# Patient Record
Sex: Female | Born: 1950
Health system: Southern US, Community
[De-identification: ages and names within clinical notes are randomized; demographics above are authoritative.]

## PROBLEM LIST (undated history)

## (undated) DIAGNOSIS — I5022 Chronic systolic (congestive) heart failure: Secondary | ICD-10-CM

## (undated) DIAGNOSIS — I255 Ischemic cardiomyopathy: Secondary | ICD-10-CM

## (undated) DIAGNOSIS — D649 Anemia, unspecified: Secondary | ICD-10-CM

## (undated) DIAGNOSIS — I10 Essential (primary) hypertension: Secondary | ICD-10-CM

## (undated) DIAGNOSIS — I480 Paroxysmal atrial fibrillation: Secondary | ICD-10-CM

## (undated) DIAGNOSIS — N186 End stage renal disease: Secondary | ICD-10-CM

## (undated) DIAGNOSIS — T8859XA Other complications of anesthesia, initial encounter: Secondary | ICD-10-CM

## (undated) DIAGNOSIS — E119 Type 2 diabetes mellitus without complications: Secondary | ICD-10-CM

## (undated) DIAGNOSIS — I2510 Atherosclerotic heart disease of native coronary artery without angina pectoris: Secondary | ICD-10-CM

## (undated) DIAGNOSIS — E785 Hyperlipidemia, unspecified: Secondary | ICD-10-CM

## (undated) DIAGNOSIS — T4145XA Adverse effect of unspecified anesthetic, initial encounter: Secondary | ICD-10-CM

## (undated) DIAGNOSIS — I219 Acute myocardial infarction, unspecified: Secondary | ICD-10-CM

## (undated) HISTORY — DX: Chronic systolic (congestive) heart failure: I50.22

## (undated) HISTORY — PX: CORONARY ANGIOPLASTY WITH STENT PLACEMENT: SHX49

## (undated) HISTORY — DX: End stage renal disease: N18.6

## (undated) HISTORY — DX: Paroxysmal atrial fibrillation: I48.0

## (undated) HISTORY — DX: Hyperlipidemia, unspecified: E78.5

## (undated) HISTORY — DX: Essential (primary) hypertension: I10

## (undated) HISTORY — DX: Ischemic cardiomyopathy: I25.5

## (undated) HISTORY — DX: Anemia, unspecified: D64.9

## (undated) HISTORY — PX: ABDOMINAL SURGERY: SHX537

---
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
## 2015-06-19 ENCOUNTER — Emergency Department: Payer: Medicaid Other

## 2015-06-19 ENCOUNTER — Inpatient Hospital Stay
Admission: EM | Admit: 2015-06-19 | Discharge: 2015-06-27 | DRG: 853 | Disposition: A | Discharge status: Home / Self Care | Payer: Medicaid Other | Attending: Internal Medicine | Admitting: Internal Medicine

## 2015-06-19 ENCOUNTER — Inpatient Hospital Stay (HOSPITAL_COMMUNITY)
Admit: 2015-06-19 | Discharge: 2015-06-19 | Disposition: A | Discharge status: Home / Self Care | Payer: Medicaid Other | Attending: Internal Medicine | Admitting: Internal Medicine

## 2015-06-19 ENCOUNTER — Encounter: Payer: Self-pay | Admitting: Emergency Medicine

## 2015-06-19 DIAGNOSIS — I4891 Unspecified atrial fibrillation: Secondary | ICD-10-CM | POA: Diagnosis present

## 2015-06-19 DIAGNOSIS — D638 Anemia in other chronic diseases classified elsewhere: Secondary | ICD-10-CM | POA: Diagnosis present

## 2015-06-19 DIAGNOSIS — I480 Paroxysmal atrial fibrillation: Secondary | ICD-10-CM | POA: Diagnosis present

## 2015-06-19 DIAGNOSIS — I252 Old myocardial infarction: Secondary | ICD-10-CM | POA: Diagnosis not present

## 2015-06-19 DIAGNOSIS — Z794 Long term (current) use of insulin: Secondary | ICD-10-CM | POA: Diagnosis not present

## 2015-06-19 DIAGNOSIS — I255 Ischemic cardiomyopathy: Secondary | ICD-10-CM | POA: Diagnosis present

## 2015-06-19 DIAGNOSIS — N183 Chronic kidney disease, stage 3 (moderate): Secondary | ICD-10-CM | POA: Diagnosis present

## 2015-06-19 DIAGNOSIS — Z79899 Other long term (current) drug therapy: Secondary | ICD-10-CM | POA: Diagnosis not present

## 2015-06-19 DIAGNOSIS — A419 Sepsis, unspecified organism: Principal | ICD-10-CM | POA: Diagnosis present

## 2015-06-19 DIAGNOSIS — I130 Hypertensive heart and chronic kidney disease with heart failure and stage 1 through stage 4 chronic kidney disease, or unspecified chronic kidney disease: Secondary | ICD-10-CM | POA: Diagnosis present

## 2015-06-19 DIAGNOSIS — I2510 Atherosclerotic heart disease of native coronary artery without angina pectoris: Secondary | ICD-10-CM | POA: Diagnosis present

## 2015-06-19 DIAGNOSIS — Z6827 Body mass index (BMI) 27.0-27.9, adult: Secondary | ICD-10-CM

## 2015-06-19 DIAGNOSIS — J189 Pneumonia, unspecified organism: Secondary | ICD-10-CM

## 2015-06-19 DIAGNOSIS — I214 Non-ST elevation (NSTEMI) myocardial infarction: Secondary | ICD-10-CM

## 2015-06-19 DIAGNOSIS — J90 Pleural effusion, not elsewhere classified: Secondary | ICD-10-CM | POA: Diagnosis not present

## 2015-06-19 DIAGNOSIS — Z7982 Long term (current) use of aspirin: Secondary | ICD-10-CM | POA: Diagnosis not present

## 2015-06-19 DIAGNOSIS — I5023 Acute on chronic systolic (congestive) heart failure: Secondary | ICD-10-CM | POA: Diagnosis present

## 2015-06-19 DIAGNOSIS — E1122 Type 2 diabetes mellitus with diabetic chronic kidney disease: Secondary | ICD-10-CM | POA: Diagnosis present

## 2015-06-19 DIAGNOSIS — N170 Acute kidney failure with tubular necrosis: Secondary | ICD-10-CM | POA: Diagnosis not present

## 2015-06-19 DIAGNOSIS — I420 Dilated cardiomyopathy: Secondary | ICD-10-CM | POA: Clinically undetermined

## 2015-06-19 HISTORY — DX: Atherosclerotic heart disease of native coronary artery without angina pectoris: I25.10

## 2015-06-19 HISTORY — DX: Type 2 diabetes mellitus without complications: E11.9

## 2015-06-19 LAB — PROTIME-INR
INR: 1
Prothrombin Time: 13.4 seconds (ref 11.4–15.0)

## 2015-06-19 LAB — COMPREHENSIVE METABOLIC PANEL
ALBUMIN: 3.1 g/dL — AB (ref 3.5–5.0)
ALT: 60 U/L — ABNORMAL HIGH (ref 14–54)
AST: 53 U/L — AB (ref 15–41)
Alkaline Phosphatase: 309 U/L — ABNORMAL HIGH (ref 38–126)
Anion gap: 10 (ref 5–15)
BILIRUBIN TOTAL: 0.4 mg/dL (ref 0.3–1.2)
BUN: 46 mg/dL — AB (ref 6–20)
CHLORIDE: 103 mmol/L (ref 101–111)
CO2: 21 mmol/L — AB (ref 22–32)
Calcium: 8.5 mg/dL — ABNORMAL LOW (ref 8.9–10.3)
Creatinine, Ser: 1.73 mg/dL — ABNORMAL HIGH (ref 0.44–1.00)
GFR calc Af Amer: 35 mL/min — ABNORMAL LOW (ref >60)
GFR calc non Af Amer: 30 mL/min — ABNORMAL LOW (ref >60)
GLUCOSE: 105 mg/dL — AB (ref 65–99)
POTASSIUM: 3.8 mmol/L (ref 3.5–5.1)
SODIUM: 134 mmol/L — AB (ref 135–145)
TOTAL PROTEIN: 6.5 g/dL (ref 6.5–8.1)

## 2015-06-19 LAB — GLUCOSE, CAPILLARY
GLUCOSE-CAPILLARY: 71 mg/dL (ref 65–99)
GLUCOSE-CAPILLARY: 160 mg/dL — AB (ref 65–99)
Glucose-Capillary: 145 mg/dL — ABNORMAL HIGH (ref 65–99)
Glucose-Capillary: 175 mg/dL — ABNORMAL HIGH (ref 65–99)

## 2015-06-19 LAB — TSH: TSH: 8.859 u[IU]/mL — ABNORMAL HIGH (ref 0.350–4.500)

## 2015-06-19 LAB — TROPONIN I
Troponin I: 1.42 ng/mL — ABNORMAL HIGH (ref <0.031)
Troponin I: 2.29 ng/mL — ABNORMAL HIGH (ref <0.031)
Troponin I: 2.94 ng/mL — ABNORMAL HIGH (ref <0.031)

## 2015-06-19 LAB — T4, FREE: FREE T4: 1.4 ng/dL — AB (ref 0.61–1.12)

## 2015-06-19 LAB — LACTIC ACID, PLASMA: LACTIC ACID, VENOUS: 0.9 mmol/L (ref 0.5–2.0)

## 2015-06-19 LAB — APTT: aPTT: 28 seconds (ref 24–36)

## 2015-06-19 LAB — HEPARIN LEVEL (UNFRACTIONATED): HEPARIN UNFRACTIONATED: 0.37 [IU]/mL (ref 0.30–0.70)

## 2015-06-19 LAB — BRAIN NATRIURETIC PEPTIDE: B NATRIURETIC PEPTIDE 5: 1782 pg/mL — AB (ref 0.0–100.0)

## 2015-06-19 MED ORDER — ALBUTEROL SULFATE (2.5 MG/3ML) 0.083% IN NEBU: 2.5000 mg | INHALATION_SOLUTION | RESPIRATORY_TRACT | Status: DC | PRN

## 2015-06-19 MED ORDER — ONDANSETRON HCL 4 MG PO TABS: 4.0000 mg | ORAL_TABLET | Freq: Four times a day (QID) | ORAL | Status: DC | PRN

## 2015-06-19 MED ORDER — ACETAMINOPHEN 650 MG RE SUPP: 650.0000 mg | Freq: Four times a day (QID) | RECTAL | Status: DC | PRN

## 2015-06-19 MED ORDER — FLEET ENEMA 7-19 GM/118ML RE ENEM: 1.0000 | ENEMA | Freq: Once | RECTAL | Status: DC | PRN

## 2015-06-19 MED ORDER — METOPROLOL TARTRATE 1 MG/ML IV SOLN
5.0000 mg | INTRAVENOUS | Status: AC
  Filled 2015-06-19: qty 5

## 2015-06-19 MED ORDER — HEPARIN (PORCINE) IN NACL 100-0.45 UNIT/ML-% IJ SOLN
900.0000 [IU]/h | INTRAMUSCULAR | Status: DC
  Administered 2015-06-19 – 2015-06-20 (×3): 900 [IU]/h via INTRAVENOUS
  Filled 2015-06-19 (×3): qty 250

## 2015-06-19 MED ORDER — OXYCODONE HCL 5 MG PO TABS: 5.0000 mg | ORAL_TABLET | ORAL | Status: DC | PRN

## 2015-06-19 MED ORDER — POLYETHYLENE GLYCOL 3350 17 G PO PACK: 17.0000 g | PACK | Freq: Every day | ORAL | Status: DC | PRN

## 2015-06-19 MED ORDER — DILTIAZEM HCL 30 MG PO TABS
30.0000 mg | ORAL_TABLET | Freq: Once | ORAL | Status: AC
  Administered 2015-06-19: 30 mg via ORAL
  Filled 2015-06-19: qty 1

## 2015-06-19 MED ORDER — LEVOFLOXACIN IN D5W 750 MG/150ML IV SOLN
750.0000 mg | INTRAVENOUS | Status: DC
  Administered 2015-06-21: 750 mg via INTRAVENOUS
  Filled 2015-06-19: qty 150

## 2015-06-19 MED ORDER — INSULIN GLARGINE 100 UNIT/ML ~~LOC~~ SOLN
20.0000 [IU] | Freq: Every day | SUBCUTANEOUS | Status: DC
  Administered 2015-06-19 – 2015-06-26 (×7): 20 [IU] via SUBCUTANEOUS
  Filled 2015-06-19 (×13): qty 0.2

## 2015-06-19 MED ORDER — SODIUM CHLORIDE 0.9 % IJ SOLN: 3.0000 mL | Freq: Two times a day (BID) | INTRAMUSCULAR | Status: DC

## 2015-06-19 MED ORDER — DILTIAZEM HCL 100 MG IV SOLR
5.0000 mg/h | Freq: Once | INTRAVENOUS | Status: DC
  Filled 2015-06-19: qty 100

## 2015-06-19 MED ORDER — SODIUM CHLORIDE 0.9 % IJ SOLN
3.0000 mL | Freq: Two times a day (BID) | INTRAMUSCULAR | Status: DC
  Administered 2015-06-20 – 2015-06-26 (×13): 3 mL via INTRAVENOUS

## 2015-06-19 MED ORDER — SODIUM CHLORIDE 0.9 % IJ SOLN: 3.0000 mL | INTRAMUSCULAR | Status: DC | PRN

## 2015-06-19 MED ORDER — FUROSEMIDE 10 MG/ML IJ SOLN
20.0000 mg | Freq: Two times a day (BID) | INTRAMUSCULAR | Status: DC
  Administered 2015-06-19: 20 mg via INTRAVENOUS
  Filled 2015-06-19: qty 2

## 2015-06-19 MED ORDER — SODIUM CHLORIDE 0.9 % IV SOLN
250.0000 mL | INTRAVENOUS | Status: DC | PRN
  Administered 2015-06-22: 250 mL via INTRAVENOUS

## 2015-06-19 MED ORDER — ONDANSETRON HCL 4 MG/2ML IJ SOLN
4.0000 mg | Freq: Four times a day (QID) | INTRAMUSCULAR | Status: DC | PRN
  Administered 2015-06-20: 4 mg via INTRAVENOUS
  Filled 2015-06-19: qty 2

## 2015-06-19 MED ORDER — ASPIRIN 81 MG PO CHEW
324.0000 mg | CHEWABLE_TABLET | Freq: Once | ORAL | Status: AC
  Administered 2015-06-19: 324 mg via ORAL
  Filled 2015-06-19: qty 4

## 2015-06-19 MED ORDER — LEVOFLOXACIN IN D5W 750 MG/150ML IV SOLN
750.0000 mg | Freq: Once | INTRAVENOUS | Status: AC
  Administered 2015-06-19: 750 mg via INTRAVENOUS
  Filled 2015-06-19: qty 150

## 2015-06-19 MED ORDER — METOPROLOL TARTRATE 25 MG PO TABS
25.0000 mg | ORAL_TABLET | Freq: Two times a day (BID) | ORAL | Status: DC
  Administered 2015-06-19 – 2015-06-22 (×7): 25 mg via ORAL
  Filled 2015-06-19 (×7): qty 1

## 2015-06-19 MED ORDER — DILTIAZEM HCL 25 MG/5ML IV SOLN
10.0000 mg | Freq: Once | INTRAVENOUS | Status: AC
  Administered 2015-06-19: 10 mg via INTRAVENOUS
  Filled 2015-06-19: qty 5

## 2015-06-19 MED ORDER — INSULIN ASPART 100 UNIT/ML ~~LOC~~ SOLN
0.0000 [IU] | Freq: Every day | SUBCUTANEOUS | Status: DC
  Administered 2015-06-21 – 2015-06-23 (×3): 2 [IU] via SUBCUTANEOUS
  Administered 2015-06-24: 100 [IU] via SUBCUTANEOUS
  Administered 2015-06-25: 2 [IU] via SUBCUTANEOUS
  Administered 2015-06-26: 3 [IU] via SUBCUTANEOUS
  Filled 2015-06-19: qty 2
  Filled 2015-06-19: qty 3
  Filled 2015-06-19 (×4): qty 2

## 2015-06-19 MED ORDER — HEPARIN SODIUM (PORCINE) 5000 UNIT/ML IJ SOLN
60.0000 [IU]/kg | Freq: Once | INTRAMUSCULAR | Status: AC
  Administered 2015-06-19: 3650 [IU] via INTRAVENOUS
  Filled 2015-06-19: qty 1

## 2015-06-19 MED ORDER — ATORVASTATIN CALCIUM 20 MG PO TABS
40.0000 mg | ORAL_TABLET | Freq: Every day | ORAL | Status: DC
  Administered 2015-06-19 – 2015-06-26 (×7): 40 mg via ORAL
  Filled 2015-06-19 (×7): qty 2

## 2015-06-19 MED ORDER — ACETAMINOPHEN 325 MG PO TABS: 650.0000 mg | ORAL_TABLET | Freq: Four times a day (QID) | ORAL | Status: DC | PRN

## 2015-06-19 MED ORDER — BISACODYL 5 MG PO TBEC: 5.0000 mg | DELAYED_RELEASE_TABLET | Freq: Every day | ORAL | Status: DC | PRN

## 2015-06-19 MED ORDER — MORPHINE SULFATE (PF) 2 MG/ML IV SOLN: 2.0000 mg | INTRAVENOUS | Status: DC | PRN

## 2015-06-19 MED ORDER — ASPIRIN EC 81 MG PO TBEC
81.0000 mg | DELAYED_RELEASE_TABLET | Freq: Every day | ORAL | Status: DC
  Administered 2015-06-20 – 2015-06-27 (×7): 81 mg via ORAL
  Filled 2015-06-19 (×7): qty 1

## 2015-06-19 MED ORDER — HEPARIN (PORCINE) IN NACL 100-0.45 UNIT/ML-% IJ SOLN
750.0000 [IU]/h | Freq: Once | INTRAMUSCULAR | Status: DC
  Filled 2015-06-19: qty 250

## 2015-06-19 MED ORDER — DOCUSATE SODIUM 100 MG PO CAPS
100.0000 mg | ORAL_CAPSULE | Freq: Two times a day (BID) | ORAL | Status: DC
  Administered 2015-06-19 – 2015-06-26 (×15): 100 mg via ORAL
  Filled 2015-06-19 (×17): qty 1

## 2015-06-19 MED ORDER — INSULIN ASPART 100 UNIT/ML ~~LOC~~ SOLN
0.0000 [IU] | Freq: Three times a day (TID) | SUBCUTANEOUS | Status: DC
  Administered 2015-06-19: 2 [IU] via SUBCUTANEOUS
  Administered 2015-06-20: 1 [IU] via SUBCUTANEOUS
  Administered 2015-06-20: 2 [IU] via SUBCUTANEOUS
  Administered 2015-06-20: 1 [IU] via SUBCUTANEOUS
  Administered 2015-06-21: 3 [IU] via SUBCUTANEOUS
  Administered 2015-06-21: 2 [IU] via SUBCUTANEOUS
  Administered 2015-06-21: 3 [IU] via SUBCUTANEOUS
  Administered 2015-06-22: 2 [IU] via SUBCUTANEOUS
  Administered 2015-06-23: 1 [IU] via SUBCUTANEOUS
  Administered 2015-06-23: 3 [IU] via SUBCUTANEOUS
  Administered 2015-06-24: 1 [IU] via SUBCUTANEOUS
  Administered 2015-06-24 – 2015-06-25 (×4): 2 [IU] via SUBCUTANEOUS
  Administered 2015-06-26: 1 [IU] via SUBCUTANEOUS
  Administered 2015-06-27 (×2): 2 [IU] via SUBCUTANEOUS
  Filled 2015-06-19 (×2): qty 2
  Filled 2015-06-19: qty 1
  Filled 2015-06-19: qty 3
  Filled 2015-06-19: qty 2
  Filled 2015-06-19: qty 1
  Filled 2015-06-19: qty 2
  Filled 2015-06-19: qty 3
  Filled 2015-06-19 (×2): qty 1
  Filled 2015-06-19: qty 3
  Filled 2015-06-19 (×6): qty 2
  Filled 2015-06-19: qty 1

## 2015-06-19 NOTE — Consult Note (Signed)
ANTICOAGULATION CONSULT NOTE - Initial Consult  Pharmacy Consult for heparin Indication: atrial fibrillation  No Known Allergies  Patient Measurements: Height: 5\' 4"  (162.6 cm) Weight: 135 lb (61.236 kg) IBW/kg (Calculated) : 54.7 Heparin Dosing Weight: 61.2kg  Vital Signs: Temp: 98.1 F (36.7 C) (01/09 0901) Temp Source: Oral (01/09 0901) BP: 122/75 mmHg (01/09 0947) Pulse Rate: 148 (01/09 0901)  Labs:  Recent Labs  06/19/15 0923  APTT 28  TROPONINI 1.42*    CrCl cannot be calculated (Patient has no serum creatinine result on file.).   Medical History: Past Medical History  Diagnosis Date  . MI (myocardial infarction) ([place])   . Diabetes mellitus without complication ([facility])     Medications:  Scheduled:  . aspirin  324 mg Oral Once  . heparin  60 Units/kg Intravenous Once   Infusions:  . diltiazem (CARDIZEM) infusion    . heparin    . levofloxacin (LEVAQUIN) IV      Assessment: Theresa Rogers is a 65yo female with atrial fibrillation. Pharmacy consulted to dose heparin in this patient.  Goal of Therapy:  Heparin level 0.3-0.7 units/ml Monitor platelets by anticoagulation protocol: Yes   Plan:  Give 3650 units bolus x 1 Start heparin infusion at 900 units/hr Check anti-Xa level in 6 hours and daily while on heparin Continue to monitor H&H and platelets  Roe Coombs, PharmD Pharmacy Resident 06/19/2015

## 2015-06-19 NOTE — ED Provider Notes (Signed)
[facility] Emergency Department Provider Note     Time seen: ----------------------------------------- 8:59 AM on 06/19/2015 -----------------------------------------    I have reviewed the triage vital signs and the nursing notes.   HISTORY  Chief Complaint Shortness of Breath    HPI Theresa Rogers is a 65 y.o. female who presents ER with cold symptoms since New Year's Day and increased shortness of breath. Patient with labored breathing on arrival with difficulty speaking in full sentences. She was also noted to be tachycardic on arrival. She has not reported a history of arrhythmia in the past, denies fevers or chills but has had significant cough. Denies nausea vomiting or diarrhea   No past medical history on file.  There are no active problems to display for this patient.   No past surgical history on file.  Allergies Review of patient's allergies indicates not on file.  Social History Social History  Substance Use Topics  . Smoking status: Not on file  . Smokeless tobacco: Not on file  . Alcohol Use: Not on file    Review of Systems Constitutional: Negative for fever. Eyes: Negative for visual changes. ENT: Negative for sore throat. Cardiovascular: Negative for chest pain. Respiratory: Positive shortness of breath, cough Gastrointestinal: Negative for abdominal pain, vomiting and diarrhea. Genitourinary: Negative for dysuria. Musculoskeletal: Negative for back pain. Positive for leg swelling Skin: Negative for rash. Neurological: Negative for headaches, focal weakness or numbness.  10-point ROS otherwise negative.  ____________________________________________   PHYSICAL EXAM:  VITAL SIGNS: ED Triage Vitals  Enc Vitals Group     BP --      Pulse --      Resp --      Temp --      Temp src --      SpO2 --      Weight --      Height --      Head Cir --      Peak Flow --      Pain Score --      Pain Loc --    Pain Edu? --      Excl. in [place]? --     Constitutional: Alert and oriented. Mild distress Eyes: Conjunctivae are normal. PERRL. Normal extraocular movements. ENT   Head: Normocephalic and atraumatic.   Nose: No congestion/rhinnorhea.   Mouth/Throat: Mucous membranes are moist.   Neck: No stridor. Cardiovascular: Rapid rate, irregular rhythm. Normal and symmetric distal pulses are present in all extremities. No murmurs, rubs, or gallops. Respiratory: Normal respiratory effort without tachypnea nor retractions. Mild rales in the bases. Gastrointestinal: Soft and nontender. No distention. No abdominal bruits.  Musculoskeletal: Nontender with normal range of motion in all extremities. Mild edema around the ankles Neurologic:  Normal speech and language. No gross focal neurologic deficits are appreciated. Speech is normal. No gait instability. Skin:  Skin is warm, dry and intact. No rash noted. Psychiatric: Mood and affect are normal. Speech and behavior are normal. Patient exhibits appropriate insight and judgment. ____________________________________________  EKG: Interpreted by me. Atrial fibrillation with a rapid ventricular response, rate is 146 bpm, normal QRS width, long QT interval.  ____________________________________________  ED COURSE:  Pertinent labs & imaging results that were available during my care of the patient were reviewed by me and considered in my medical decision making (see chart for details). Patient with new onset A. fib, she'll be given IV and oral Cardizem for rate control. Blood pressure is normal this time. We'll check basic  cardiac labs and chest x-ray. ____________________________________________    LABS (pertinent positives/negatives)  Labs Reviewed  BRAIN NATRIURETIC PEPTIDE - Abnormal; Notable for the following:    B Natriuretic Peptide 1782.0 (*)    All other components within normal limits  TROPONIN I - Abnormal; Notable for the  following:    Troponin I 1.42 (*)    All other components within normal limits  COMPREHENSIVE METABOLIC PANEL - Abnormal; Notable for the following:    Sodium 134 (*)    CO2 21 (*)    Glucose, Bld 105 (*)    BUN 46 (*)    Creatinine, Ser 1.73 (*)    Calcium 8.5 (*)    Albumin 3.1 (*)    AST 53 (*)    ALT 60 (*)    Alkaline Phosphatase 309 (*)    GFR calc non Af Amer 30 (*)    GFR calc Af Amer 35 (*)    All other components within normal limits  TSH - Abnormal; Notable for the following:    TSH 8.859 (*)    All other components within normal limits  APTT  CBC WITH DIFFERENTIAL/PLATELET  PROTIME-INR  T4, FREE    RADIOLOGY Images were viewed by me  Chest x-ray Reveals right upper lobe pneumonia, Basilar atelectasis  CRITICAL CARE Performed by: Earleen Newport   Total critical care time: 30 minutes  Critical care time was exclusive of separately billable procedures and treating other patients.  Critical care was necessary to treat or prevent imminent or life-threatening deterioration.  Critical care was time spent personally by me on the following activities: development of treatment plan with patient and/or surrogate as well as nursing, discussions with consultants, evaluation of patient's response to treatment, examination of patient, obtaining history from patient or surrogate, ordering and performing treatments and interventions, ordering and review of laboratory studies, ordering and review of radiographic studies, pulse oximetry and re-evaluation of patient's condition.  ____________________________________________  FINAL ASSESSMENT AND PLAN  Dyspnea, rapid atrial fibrillation, pneumonia, elevated troponin  Plan: Patient with labs and imaging as dictated above. Patient with new onset atrial fibrillation with a rapid ventricular response, elevated troponin likely demand related. She was placed on a Cardizem and heparin drip as well as given oral aspirin. Vital  signs are stable at this time.   Earleen Newport, MD   Earleen Newport, MD 06/19/15 1057

## 2015-06-19 NOTE — Progress Notes (Signed)
Patient presently resting in bed at this time, denies pain, VSS NSR on the monitor,denies any chest pain or discomfort, heparin drip infusing at 9 ML per hr n active bleeding noted

## 2015-06-19 NOTE — Progress Notes (Signed)
*  PRELIMINARY RESULTS* Echocardiogram 2D Echocardiogram has been performed.  Theresa Rogers 06/19/2015, 5:04 PM

## 2015-06-19 NOTE — Consult Note (Signed)
Cardiology Consultation Note  Patient ID: Gurneet Prak, MRN: [id number], DOB/AGE: Sep 22, 1950 65 y.o. Admit date: 06/19/2015   Date of Consult: 06/19/2015 Primary Physician: Pcp Not In System Primary Cardiologist: New to [facility]  Chief Complaint: SOB Reason for Consult: Afib with RVR and NSTEMI  HPI: 65 y.o. female with h/o CAD with history of MI/syncope s/p remote PCI in 2009 in [place], and DM2 diagnosed in her 26's who presented to [facility] with increased SOB and was found to have RUL PNA, be in new onset Afib with RVR with heart rates in the 140's, and have a troponin of 1.42.   She was previously followed by her cardiologist in [place] starting back in 2009 after a possible MI/syncopal event at work. She denies any CPR/defibrillation at that time. She was taken to the cath lab in 2009 and underwent PCI to a single coronary artery. She subsequently underwent exercise stress testing yearly for the first 5 years after her above event with self reported normal results. She has not had a cardiac cath since first original one in 2009. She moved down to [place] from [place] to be closer to her grandchildren.   She has been in her usual state of health lately, just under increased amounts of stress with the above move and going through a divorce. Over the past week she has been experiencing increased SOB and cough that has been productive of green sputum. Symptoms were similar to when she had PNA in her 40's. She has not had any chest pain, tachy-palpitations, nausea, vomiting, presyncope, or syncope. Her appetite has been normal. She denies any orthopnea, PND, LEE, or early satiety.   Upon the patient's arrival to [facility] they were found to have a troponin of 1.42, BNP of 1782, SCr 1.73, BUN 46, CBC pending, lactic acid 0.9. ECG showed Afib, 146 bpm, right axis deviation, prolonged QT, inferior Q waves, inferolateral TWI, CXR showed RUL PNA and small pleural effusions. She was started on a Cardizem gtt and converted to  sinus rhythm around 11:26 AM and has remained in sinus with a heart rate in the 70's. She was placed on Lopressor. She was also placed on a heparin gtt. She was started on Levaquin for her PNA. Echo is pending. While she was in Afib with RVR she did not feel in tachy-palpitations or any increased dyspnea.    Past Medical History  Diagnosis Date  . CAD (coronary artery disease)     a. s/p MI/syncope in 2009 s/p remote stenting  . Diabetes mellitus without complication ([place])       Most Recent Cardiac Studies: None   Surgical History:  Past Surgical History  Procedure Laterality Date  . Coronary angioplasty with stent placement       Home Meds: Prior to Admission medications   Medication Sig Start Date End Date Taking? Authorizing Provider  insulin glargine (LANTUS) 100 UNIT/ML injection Inject 20 Units into the skin at bedtime.   Yes Historical Provider, MD  insulin lispro (HUMALOG) 100 UNIT/ML injection Inject 8-12 Units into the skin 3 (three) times daily before meals.   Yes Historical Provider, MD    Inpatient Medications:  . aspirin EC  81 mg Oral Daily  . atorvastatin  40 mg Oral q1800  . docusate sodium  100 mg Oral BID  . insulin aspart  0-5 Units Subcutaneous QHS  . insulin aspart  0-9 Units Subcutaneous TID WC  . insulin glargine  20 Units Subcutaneous QHS  . [START ON 06/21/2015]  levofloxacin (LEVAQUIN) IV  750 mg Intravenous Q48H  . metoprolol  5 mg Intravenous STAT  . metoprolol tartrate  25 mg Oral BID  . sodium chloride  3 mL Intravenous Q12H  . sodium chloride  3 mL Intravenous Q12H   . heparin 900 Units/hr (06/19/15 1145)    Allergies: No Known Allergies  Social History   Social History  . Marital Status: Divorced    Spouse Name: N/A  . Number of Children: N/A  . Years of Education: N/A   Occupational History  . Not on file.   Social History Main Topics  . Smoking status: Never Smoker   . Smokeless tobacco: Not on file  . Alcohol Use: No  . Drug  Use: No  . Sexual Activity: Not on file   Other Topics Concern  . Not on file   Social History Narrative  . No narrative on file     Family History  Problem Relation Age of Onset  . CAD Mother   . Stroke Father   . Diabetes Brother   . Kidney failure Brother      Review of Systems: Review of Systems  Constitutional: Positive for chills and malaise/fatigue. Negative for fever, weight loss and diaphoresis.  HENT: Positive for congestion. Negative for sore throat.   Eyes: Negative for discharge and redness.  Respiratory: Positive for cough, shortness of breath and wheezing. Negative for hemoptysis.   Cardiovascular: Negative for chest pain, palpitations, orthopnea, claudication, leg swelling and PND.  Gastrointestinal: Negative for heartburn, nausea, vomiting, abdominal pain, blood in stool and melena.  Genitourinary: Negative for hematuria.  Musculoskeletal: Negative for myalgias and falls.  Skin: Negative for rash.  Neurological: Positive for weakness and headaches. Negative for dizziness, tingling, tremors, sensory change, speech change, focal weakness and loss of consciousness.  Endo/Heme/Allergies: Does not bruise/bleed easily.  Psychiatric/Behavioral: Negative for substance abuse. The patient is not nervous/anxious.   All other systems reviewed and are negative.   Labs:  Recent Labs  06/19/15 0923  TROPONINI 1.42*   No results found for: WBC, HGB, HCT, MCV, PLT   Recent Labs Lab 06/19/15 0923  NA 134*  K 3.8  CL 103  CO2 21*  BUN 46*  CREATININE 1.73*  CALCIUM 8.5*  PROT 6.5  BILITOT 0.4  ALKPHOS 309*  ALT 60*  AST 53*  GLUCOSE 105*   No results found for: CHOL, HDL, LDLCALC, TRIG No results found for: DDIMER  Radiology/Studies:  Dg Chest Port 1 View  06/19/2015  CLINICAL DATA:  Cold, cough and shortness of Breath for 9 days. EXAM: PORTABLE CHEST 1 VIEW COMPARISON:  None. FINDINGS: The heart is mildly enlarged. The mediastinal and hilar contours are  grossly normal. Low lung volumes with vascular crowding and bibasilar atelectasis. Probable small bilateral pleural effusions. Right upper lobe infiltrate is noted. The bony thorax is intact. IMPRESSION: Right upper lobe pneumonia. Low lung volumes with vascular crowding, bibasilar atelectasis and small pleural effusions. Electronically Signed   By: Marijo Sanes M.D.   On: 06/19/2015 09:26    EKG: Afib, 146 bpm, right axis deviation, prolonged QT, inferior Q waves, inferolateral TWI  Weights: Filed Weights   06/19/15 0901  Weight: 135 lb (61.236 kg)     Physical Exam: Blood pressure 134/67, pulse 79, temperature 97.9 F (36.6 C), temperature source Oral, resp. rate 18, height 5\' 4"  (1.626 m), weight 135 lb (61.236 kg), SpO2 93 %. Body mass index is 23.16 kg/(m^2). General: Well developed, well nourished, in  no acute distress. Head: Normocephalic, atraumatic, sclera non-icteric, no xanthomas, nares are without discharge.  Neck: Negative for carotid bruits. JVD not elevated. Lungs: Right upper lobe rhonchi. Breathing is unlabored. Heart: RRR with S1 S2. No murmurs, rubs, or gallops appreciated. Abdomen: Soft, non-tender, non-distended with normoactive bowel sounds. No hepatomegaly. No rebound/guarding. No obvious abdominal masses. Msk:  Strength and tone appear normal for age. Extremities: No clubbing or cyanosis. No edema.  Distal pedal pulses are 2+ and equal bilaterally. Neuro: Alert and oriented X 3. No facial asymmetry. No focal deficit. Moves all extremities spontaneously. Psych:  Responds to questions appropriately with a normal affect.    Assessment and Plan:   1. New onset Afib with RVR: -Currently in sinus rhythm with heart rates in the 70's -Likely in the setting of her RUL PNA -It is unclear how long she was in Afib as she presented in Afib and was asymptomatic from a tachy-palpitation and increased dyspnea standpoint (outside of her PNA) -Continue Lopressor 25 mg bid for  rate control -On heparin gtt for anticoagulation at this time. Continue for now pending her ischemic work up below, though she will need long term full dose anticoagulation given her CHADS2VASc of at least 3 (DM, vascular disease, sex category). Echo is pending and likely to increase this to 4 -Once she has completed her below ischemic work up could use either Xarelto 15 mg q dinner (CrCl 32.18 mL/min) or Eliquis 5 mg bid (as SCr 1.73 only satisfies one of the 3 requirements to decrease to 2.5 mg bid)  2. Elevated troponin: -Her initial value of 1.42 is generally more elevated than would be assumed for demand ischemia and lends [place] to believe there is underlying coronary disease leading to this elevation in the setting of her RUL PNA and Afib with RVR -2nd troponin is pending at this time to trend -Continue heparin gtt at this time -Echo is pending to evaluate LV function, wall motion, and right-sided pressure -In the setting of the above elevated troponin she would need further ischemic work up. Will discuss with MD cath schedule -Risks and benefits of cardiac catheterization have been discussed with the patient including risks of bleeding, bruising, infection, kidney damage, stroke, heart attack, and death. The patient understands these risks and is willing to proceed with the procedure. All questions have been answered and concerns listened to  3. Sepsis in the setting of RUL PNA: -Patient meets at least 2/4 SIRS criteria (CBC not resulted), with source of infection known -Levaquin per IM -Consider changing albuterol to Xopenex in the setting of the above Afib with RVR -May need steroids if indicated   4. Diabetes: -SSI per IM   Signed, Christell Faith, PA-C Pager: [phone] 06/19/2015, 4:30 PM

## 2015-06-19 NOTE — Progress Notes (Signed)
Skin assessment completed withness by [employer] RN

## 2015-06-19 NOTE — ED Notes (Signed)
Pt eating [place] sandwich meal

## 2015-06-19 NOTE — H&P (Signed)
Theresa Rogers at [place] NAME: Theresa Rogers    MR#:  [id number]  DATE OF BIRTH:  April 17, 1951  DATE OF ADMISSION:  06/19/2015  PRIMARY CARE PHYSICIAN: Pcp Not In System   REQUESTING/REFERRING PHYSICIAN: Dr. Jimmye Norman  CHIEF COMPLAINT:   Chief Complaint  Patient presents with  . Shortness of Breath    HISTORY OF PRESENT ILLNESS:  [name]  is a 65 y.o. female with a known history of CAD, IDDM, HTN here with SOB, fatigue. Tachycardic with Afib on arrival into 140s. Given cardizem bolus IV and PO 30mg . Now 85-125 between Afib and NSR. Symptoms started out as cold and progressively worsened over 7 days. Has orthopnea and edema.  PCI 8 yrs back - 1 stent. Last stress test 5 years back. Moved from [place] 3 months back.  PAST MEDICAL HISTORY:   Past Medical History  Diagnosis Date  . MI (myocardial infarction) ([place])   . Diabetes mellitus without complication ([place])     PAST SURGICAL HISTORY:   Past Surgical History  Procedure Laterality Date  . Coronary angioplasty with stent placement      SOCIAL HISTORY:   Social History  Substance Use Topics  . Smoking status: Never Smoker   . Smokeless tobacco: Not on file  . Alcohol Use: No    FAMILY HISTORY:  No family history on file.  DRUG ALLERGIES:  No Known Allergies  REVIEW OF SYSTEMS:   Review of Systems  Constitutional: Positive for chills and malaise/fatigue. Negative for fever and weight loss.  HENT: Negative for hearing loss and nosebleeds.   Eyes: Negative for blurred vision, double vision and pain.  Respiratory: Positive for cough and shortness of breath. Negative for hemoptysis, sputum production and wheezing.   Cardiovascular: Positive for orthopnea and leg swelling. Negative for chest pain and palpitations.  Gastrointestinal: Negative for nausea, vomiting, abdominal pain, diarrhea and constipation.  Genitourinary: Negative for dysuria and hematuria.   Musculoskeletal: Positive for myalgias. Negative for back pain and falls.  Skin: Negative for rash.  Neurological: Positive for weakness. Negative for dizziness, tremors, sensory change, speech change, focal weakness, seizures and headaches.  Endo/Heme/Allergies: Does not bruise/bleed easily.  Psychiatric/Behavioral: Negative for depression and memory loss. The patient is not nervous/anxious.     MEDICATIONS AT HOME:   Prior to Admission medications   Medication Sig Start Date End Date Taking? Authorizing Provider  insulin glargine (LANTUS) 100 UNIT/ML injection Inject 20 Units into the skin at bedtime.   Yes Historical Provider, MD  insulin lispro (HUMALOG) 100 UNIT/ML injection Inject 8-12 Units into the skin 3 (three) times daily before meals.   Yes Historical Provider, MD      VITAL SIGNS:  Blood pressure 122/75, pulse 148, temperature 98.1 F (36.7 C), temperature source Oral, resp. rate 30, height 5\' 4"  (1.626 m), weight 61.236 kg (135 lb), SpO2 94 %.  PHYSICAL EXAMINATION:  Physical Exam  GENERAL:  65 y.o.-year-old patient lying in the bed with no acute distress.  EYES: Pupils equal, round, reactive to light and accommodation. No scleral icterus. Extraocular muscles intact.  HEENT: Head atraumatic, normocephalic. Oropharynx and nasopharynx clear. No oropharyngeal erythema, moist oral mucosa  NECK:  Supple, no jugular venous distention. No thyroid enlargement, no tenderness.  LUNGS: Increased work of breathing. Right ronchi. CARDIOVASCULAR: S1, S2 normal. Irregular. Tachycardic ABDOMEN: Soft, nontender, nondistended. Bowel sounds present. No organomegaly or mass.  EXTREMITIES: No pedal edema, cyanosis, or clubbing. + 2 pedal & radial  pulses b/l.   NEUROLOGIC: Cranial nerves II through XII are intact. No focal Motor or sensory deficits appreciated b/l PSYCHIATRIC: The patient is alert and oriented x 3. Good affect.  SKIN: No obvious rash, lesion, or ulcer.   LABORATORY  PANEL:   CBC No results for input(s): WBC, HGB, HCT, PLT in the last 168 hours. ------------------------------------------------------------------------------------------------------------------  Chemistries   Recent Labs Lab 06/19/15 0923  NA 134*  K 3.8  CL 103  CO2 21*  GLUCOSE 105*  BUN 46*  CREATININE 1.73*  CALCIUM 8.5*  AST 53*  ALT 60*  ALKPHOS 309*  BILITOT 0.4   ------------------------------------------------------------------------------------------------------------------  Cardiac Enzymes  Recent Labs Lab 06/19/15 0923  TROPONINI 1.42*   ------------------------------------------------------------------------------------------------------------------  RADIOLOGY:  Dg Chest Port 1 View  06/19/2015  CLINICAL DATA:  Cold, cough and shortness of Breath for 9 days. EXAM: PORTABLE CHEST 1 VIEW COMPARISON:  None. FINDINGS: The heart is mildly enlarged. The mediastinal and hilar contours are grossly normal. Low lung volumes with vascular crowding and bibasilar atelectasis. Probable small bilateral pleural effusions. Right upper lobe infiltrate is noted. The bony thorax is intact. IMPRESSION: Right upper lobe pneumonia. Low lung volumes with vascular crowding, bibasilar atelectasis and small pleural effusions. Electronically Signed   By: Marijo Sanes M.D.   On: 06/19/2015 09:26     IMPRESSION AND PLAN:   * Afib with RVR Likely due to pneumonia. Need to rule out MI. Check serial troponins. Patient continues to be tachycardic with atrial fibrillation. We will give a stat dose of Lopressor 5 mg IV 1 time. If she has ongoing tachycardia will start her on a Cardizem drip. Checked TSH An mildly elevated. Check free T3 and free T4. Check echocardiogram and consult cardiology. Elevated troponin could be due to pneumonia and atrial fibrillation. The possibility of an NSTEMI. On heparin drip, aspirin, statin and beta blocker. Await cardiology input.  * RUL CAP Start  Levaquin. Wean oxygen as needed. Nebulizers as needed.  * ARF  due to pneumonia. Has elevated BNP and some orthopnea and mild edema. No IV fluids at this point.  * IDDM  continue home dose of insulin along with sliding scale. Diabetic diet.  * DVT prophylaxis On heparin drip    All the records are reviewed and case discussed with ED provider. Management plans discussed with the patient, family and they are in agreement.  CODE STATUS: FULL  TOTAL CC TIME TAKING CARE OF THIS PATIENT: 45 minutes.    Hillary Bow R M.D on 06/19/2015 at 11:53 AM  Between 7am to 6pm - Pager - [phone]  After 6pm go to [email or web address] - password [email or web address] [place] [facility]  Office  [phone]  CC: Primary care physician; Pcp Not In System     Note: This dictation was prepared with Dragon dictation along with smaller phrase technology. Any transcriptional errors that result from this process are unintentional.

## 2015-06-19 NOTE — Consult Note (Signed)
ANTIBIOTIC CONSULT NOTE - INITIAL  Pharmacy Consult for levofloxacin Indication: pneumonia  No Known Allergies  Patient Measurements: Height: 5\' 4"  (162.6 cm) Weight: 135 lb (61.236 kg) IBW/kg (Calculated) : 54.7 Adjusted Body Weight:   Vital Signs: Temp: 98.1 F (36.7 C) (01/09 0901) Temp Source: Oral (01/09 0901) BP: 140/94 mmHg (01/09 1230) Pulse Rate: 84 (01/09 1230) Intake/Output from previous day:   Intake/Output from this shift:    Labs:  Recent Labs  06/19/15 0923  CREATININE 1.73*   Estimated Creatinine Clearance: 28.7 mL/min (by C-G formula based on Cr of 1.73). No results for input(s): VANCOTROUGH, VANCOPEAK, VANCORANDOM, GENTTROUGH, GENTPEAK, GENTRANDOM, TOBRATROUGH, TOBRAPEAK, TOBRARND, AMIKACINPEAK, AMIKACINTROU, AMIKACIN in the last 72 hours.   Microbiology: No results found for this or any previous visit (from the past 720 hour(s)).  Medical History: Past Medical History  Diagnosis Date  . MI (myocardial infarction) ([place])   . Diabetes mellitus without complication (HCC)     Medications:  Scheduled:  . aspirin EC  81 mg Oral Daily  . atorvastatin  40 mg Oral q1800  . docusate sodium  100 mg Oral BID  . insulin aspart  0-5 Units Subcutaneous QHS  . insulin aspart  0-9 Units Subcutaneous TID WC  . metoprolol  5 mg Intravenous STAT  . metoprolol tartrate  25 mg Oral BID  . sodium chloride  3 mL Intravenous Q12H  . sodium chloride  3 mL Intravenous Q12H   Assessment: Pt is a 65 year old female who presents with SOB, fatigue, chills, cough tachycardic with afib. Chest x-rays shows pna. Pharmacy consulted to dose levofloxacin. Pt received 750mg  IV once in the ED.  Goal of Therapy:  resolution of infection  Plan:  Follow up culture results will start levofloxacin IV 750mg  q 48 hours due to renal function. Pharmacy to continue to monitor. Will adjust as needed. Thank you for the consult.  Naidelin Gugliotta D Eloyce Bultman 06/19/2015,12:38 PM

## 2015-06-19 NOTE — ED Notes (Signed)
Pt started with mild cold sx new years day. Now c/o SHOB. Labored and unable to speak in full sentences in room.cough.

## 2015-06-20 DIAGNOSIS — I214 Non-ST elevation (NSTEMI) myocardial infarction: Secondary | ICD-10-CM

## 2015-06-20 DIAGNOSIS — J189 Pneumonia, unspecified organism: Secondary | ICD-10-CM

## 2015-06-20 DIAGNOSIS — J90 Pleural effusion, not elsewhere classified: Secondary | ICD-10-CM | POA: Diagnosis not present

## 2015-06-20 DIAGNOSIS — I420 Dilated cardiomyopathy: Secondary | ICD-10-CM | POA: Clinically undetermined

## 2015-06-20 DIAGNOSIS — I2510 Atherosclerotic heart disease of native coronary artery without angina pectoris: Secondary | ICD-10-CM | POA: Diagnosis present

## 2015-06-20 DIAGNOSIS — N170 Acute kidney failure with tubular necrosis: Secondary | ICD-10-CM | POA: Diagnosis not present

## 2015-06-20 LAB — GLUCOSE, CAPILLARY
GLUCOSE-CAPILLARY: 143 mg/dL — AB (ref 65–99)
GLUCOSE-CAPILLARY: 181 mg/dL — AB (ref 65–99)
GLUCOSE-CAPILLARY: 141 mg/dL — AB (ref 65–99)
GLUCOSE-CAPILLARY: 88 mg/dL (ref 65–99)
Glucose-Capillary: 160 mg/dL — ABNORMAL HIGH (ref 65–99)

## 2015-06-20 LAB — CBC
HEMATOCRIT: 25.3 % — AB (ref 35.0–47.0)
Hemoglobin: 8.7 g/dL — ABNORMAL LOW (ref 12.0–16.0)
MCH: 29.2 pg (ref 26.0–34.0)
MCHC: 34.3 g/dL (ref 32.0–36.0)
MCV: 85 fL (ref 80.0–100.0)
PLATELETS: 348 10*3/uL (ref 150–440)
RBC: 2.97 MIL/uL — AB (ref 3.80–5.20)
RDW: 12.6 % (ref 11.5–14.5)
WBC: 9.2 10*3/uL (ref 3.6–11.0)

## 2015-06-20 LAB — BASIC METABOLIC PANEL
Anion gap: 7 (ref 5–15)
BUN: 53 mg/dL — AB (ref 6–20)
CO2: 26 mmol/L (ref 22–32)
CREATININE: 2.04 mg/dL — AB (ref 0.44–1.00)
Calcium: 8.3 mg/dL — ABNORMAL LOW (ref 8.9–10.3)
Chloride: 102 mmol/L (ref 101–111)
GFR, EST AFRICAN AMERICAN: 29 mL/min — AB (ref >60)
GFR, EST NON AFRICAN AMERICAN: 25 mL/min — AB (ref >60)
Glucose, Bld: 144 mg/dL — ABNORMAL HIGH (ref 65–99)
POTASSIUM: 4.2 mmol/L (ref 3.5–5.1)
SODIUM: 135 mmol/L (ref 135–145)

## 2015-06-20 LAB — HEPARIN LEVEL (UNFRACTIONATED): HEPARIN UNFRACTIONATED: 0.38 [IU]/mL (ref 0.30–0.70)

## 2015-06-20 LAB — T3, FREE: T3, Free: 1.9 pg/mL — ABNORMAL LOW (ref 2.0–4.4)

## 2015-06-20 NOTE — Progress Notes (Signed)
Patient: Theresa Rogers / Admit Date: 06/19/2015 / Date of Encounter: 06/20/2015, 8:28 AM   Subjective: Cough overnight, worse when supine, Leg edema resolved, Denies chest  pain Tele reviewed by myself, Maintaining NSR  Worsening creatinine today, anemia ECHO read by myself showing EF 30%/global hypokinesis, normal RVSP  Review of Systems: Review of Systems  Respiratory: Positive for cough and shortness of breath.   Cardiovascular: Negative.   Gastrointestinal: Negative.   Musculoskeletal: Negative.   Neurological: Positive for weakness.  Psychiatric/Behavioral: Negative.   All other systems reviewed and are negative.   Objective: Telemetry: NSR Physical Exam: Blood pressure 138/70, pulse 70, temperature 98.1 F (36.7 C), temperature source Oral, resp. rate 17, height 5\' 4"  (1.626 m), weight 162 lb 9.6 oz (73.755 kg), SpO2 93 %. Body mass index is 27.9 kg/(m^2). General: Well developed, well nourished, in no acute distress. Head: Normocephalic, atraumatic, sclera non-icteric, no xanthomas, nares are without discharge. Neck: Negative for carotid bruits. JVP not elevated. Lungs: Dull at the bases, left >right, scant rales Heart: RRR S1 S2 without murmurs, rubs, or gallops.  Abdomen: Soft, non-tender, non-distended with normoactive bowel sounds. No rebound/guarding. Extremities: No clubbing or cyanosis. No edema. Distal pedal pulses are 2+ and equal bilaterally. Neuro: Alert and oriented X 3. Moves all extremities spontaneously. Psych:  Responds to questions appropriately with a normal affect.   Intake/Output Summary (Last 24 hours) at 06/20/15 0828 Last data filed at 06/20/15 [id number]  Gross per 24 hour  Intake      0 ml  Output    425 ml  Net   -425 ml    Inpatient Medications:  . aspirin EC  81 mg Oral Daily  . atorvastatin  40 mg Oral q1800  . docusate sodium  100 mg Oral BID  . insulin aspart  0-5 Units Subcutaneous QHS  . insulin aspart  0-9 Units Subcutaneous  TID WC  . insulin glargine  20 Units Subcutaneous QHS  . [START ON 06/21/2015] levofloxacin (LEVAQUIN) IV  750 mg Intravenous Q48H  . metoprolol  5 mg Intravenous STAT  . metoprolol tartrate  25 mg Oral BID  . sodium chloride  3 mL Intravenous Q12H   Infusions:  . heparin 900 Units/hr (06/19/15 1809)    Labs:  Recent Labs  06/19/15 0923 06/20/15 0537  NA 134* 135  K 3.8 4.2  CL 103 102  CO2 21* 26  GLUCOSE 105* 144*  BUN 46* 53*  CREATININE 1.73* 2.04*  CALCIUM 8.5* 8.3*    Recent Labs  06/19/15 0923  AST 53*  ALT 60*  ALKPHOS 309*  BILITOT 0.4  PROT 6.5  ALBUMIN 3.1*    Recent Labs  06/20/15 0537  WBC 9.2  HGB 8.7*  HCT 25.3*  MCV 85.0  PLT 348    Recent Labs  06/19/15 0923 06/19/15 1601 06/19/15 2148  TROPONINI 1.42* 2.29* 2.94*   Invalid input(s): POCBNP No results for input(s): HGBA1C in the last 72 hours.   Weights: Filed Weights   06/19/15 0901 06/20/15 0419  Weight: 135 lb (61.236 kg) 162 lb 9.6 oz (73.755 kg)     Radiology/Studies:  Dg Chest Port 1 View  06/19/2015  CLINICAL DATA:  Cold, cough and shortness of Breath for 9 days. EXAM: PORTABLE CHEST 1 VIEW COMPARISON:  None. FINDINGS: The heart is mildly enlarged. The mediastinal and hilar contours are grossly normal. Low lung volumes with vascular crowding and bibasilar atelectasis. Probable small bilateral pleural effusions. Right upper lobe infiltrate  is noted. The bony thorax is intact. IMPRESSION: Right upper lobe pneumonia. Low lung volumes with vascular crowding, bibasilar atelectasis and small pleural effusions. Electronically Signed   By: Marijo Sanes M.D.   On: 06/19/2015 09:26     Assessment and Plan  65 y.o. female   1. New onset Afib with RVR:  in sinus rhythm  -Continue Lopressor 25 mg bid for rate control -On heparin gtt for anticoagulation at this time. -Once she has completed her  ischemic work up could use either Xarelto 15 mg q dinner (CrCl 32.18 mL/min) or Eliquis  5 mg bid (as SCr 2 only satisfies one of the 3 requirements to decrease to 2.5 mg bid)  2. NSTEMI  underlying coronary disease leading to this elevation in the setting of her RUL PNA and Afib with RVR -Continue heparin gtt at this time Ideally needs a cardiac cath, but renal function getting worse Also with Anemia ---Would monitor for now, ideally will need cath prior to discharge  3. Sepsis in the setting of RUL PNA: -Patient meets at least 2/4 SIRS criteria (CBC not resulted), with source of infection known -Levaquin per IM  4. Diabetes: -SSI per IM  5. Anemia: HCT 25 Will need to monitor closely, She is on heparin infusion  6. Pleural effusion: Secondary to systolic CHF, Will need time to reaborb  7) Acute renal failure: Possible ATN, Will hold lasix as RVSP on echo is normal,  Worsening renal failure today  8) Cardiomyopathy, systolic dysfunction EF [id number] on echo, in NSR Etiology not clear Ideally needs a cardiac cath prior to D/C, Limited by renal function/anemia  Long discussion with the patient about the medical issue above  Signed, Esmond Plants, 06/20/2015, 8:28 AM

## 2015-06-20 NOTE — Consult Note (Signed)
ANTICOAGULATION CONSULT NOTE - Initial Consult  Pharmacy Consult for heparin Indication: atrial fibrillation  No Known Allergies  Patient Measurements: Height: 5\' 4"  (162.6 cm) Weight: 162 lb 9.6 oz (73.755 kg) IBW/kg (Calculated) : 54.7 Heparin Dosing Weight: 61.2kg  Vital Signs: Temp: 98.1 F (36.7 C) (01/10 0419) Temp Source: Oral (01/10 0419) BP: 155/98 mmHg (01/10 0745) Pulse Rate: 87 (01/10 0745)  Labs:  Recent Labs  06/19/15 0923 06/19/15 1601 06/19/15 1721 06/19/15 2148 06/20/15 0537 06/20/15 0826  HGB  --   --   --   --  8.7*  --   HCT  --   --   --   --  25.3*  --   PLT  --   --   --   --  348  --   APTT 28  --   --   --   --   --   LABPROT 13.4  --   --   --   --   --   INR 1.00  --   --   --   --   --   HEPARINUNFRC  --   --  0.37  --   --  0.38  CREATININE 1.73*  --   --   --  2.04*  --   TROPONINI 1.42* 2.29*  --  2.94*  --   --     Estimated Creatinine Clearance: 27.8 mL/min (by C-G formula based on Cr of 2.04).   Medical History: Past Medical History  Diagnosis Date  . CAD (coronary artery disease)     a. s/p MI/syncope in 2009 s/p remote stenting  . Diabetes mellitus without complication (HCC)     Medications:  Scheduled:  . aspirin EC  81 mg Oral Daily  . atorvastatin  40 mg Oral q1800  . docusate sodium  100 mg Oral BID  . insulin aspart  0-5 Units Subcutaneous QHS  . insulin aspart  0-9 Units Subcutaneous TID WC  . insulin glargine  20 Units Subcutaneous QHS  . [START ON 06/21/2015] levofloxacin (LEVAQUIN) IV  750 mg Intravenous Q48H  . metoprolol  5 mg Intravenous STAT  . metoprolol tartrate  25 mg Oral BID  . sodium chloride  3 mL Intravenous Q12H   Infusions:  . heparin 900 Units/hr (06/19/15 1809)    Assessment: LM is a 65yo female with atrial fibrillation. Pharmacy consulted to dose heparin in this patient.  Goal of Therapy:  Heparin level 0.3-0.7 units/ml Monitor platelets by anticoagulation protocol: Yes   Plan:   Two consecutive heparin levels at goal so will continue heparin infusion at 900 units/hr and f/u am labs.   Ulice Dash, PharmD Clinical Pharmacist    06/20/2015

## 2015-06-20 NOTE — Progress Notes (Signed)
[facility] NAME: Theresa Rogers    MR#:  [id number]  DATE OF BIRTH:  1950/10/20  SUBJECTIVE:  CHIEF COMPLAINT:   Chief Complaint  Patient presents with  . Shortness of Breath    Have pneumonia, and A fib with elevated troponin, Feels good now on room air. Remains on Heparin drip, renal func is worse. HR stable.  REVIEW OF SYSTEMS:  CONSTITUTIONAL: No fever, positive for fatigue or weakness.  EYES: No blurred or double vision.  EARS, NOSE, AND THROAT: No tinnitus or ear pain.  RESPIRATORY: positive for cough & shortness of breath,no wheezing or hemoptysis.  CARDIOVASCULAR: No chest pain,positive for orthopnea, edema.  GASTROINTESTINAL: No nausea, vomiting, diarrhea or abdominal pain.  GENITOURINARY: No dysuria, hematuria.  ENDOCRINE: No polyuria, nocturia,  HEMATOLOGY: No anemia, easy bruising or bleeding SKIN: No rash or lesion. MUSCULOSKELETAL: No joint pain or arthritis.   NEUROLOGIC: No tingling, numbness, positive for generalized weakness.  PSYCHIATRY: No anxiety or depression.   ROS  DRUG ALLERGIES:  No Known Allergies  VITALS:  Blood pressure 153/77, pulse 82, temperature 98.3 F (36.8 C), temperature source Oral, resp. rate 20, height 5\' 4"  (1.626 m), weight 73.755 kg (162 lb 9.6 oz), SpO2 90 %.  PHYSICAL EXAMINATION:   GENERAL: 65 y.o.-year-old patient lying in the bed with no acute distress.  EYES: Pupils equal, round, reactive to light and accommodation. No scleral icterus. Extraocular muscles intact.  HEENT: Head atraumatic, normocephalic. Oropharynx and nasopharynx clear. No oropharyngeal erythema, moist oral mucosa  NECK: Supple, no jugular venous distention. No thyroid enlargement, no tenderness.  LUNGS: Increased work of breathing. Right ronchi. CARDIOVASCULAR: S1, S2 normal. Irregular. Tachycardic ABDOMEN: Soft, nontender, nondistended. Bowel sounds present. No organomegaly or mass.   EXTREMITIES: No pedal edema, cyanosis, or clubbing. + 2 pedal & radial pulses b/l.  NEUROLOGIC: Cranial nerves II through XII are intact. No focal Motor or sensory deficits appreciated b/l PSYCHIATRIC: The patient is alert and oriented x 3. Good affect.  SKIN: No obvious rash, lesion, or ulcer.   Physical Exam LABORATORY PANEL:   CBC  Recent Labs Lab 06/20/15 0537  WBC 9.2  HGB 8.7*  HCT 25.3*  PLT 348   ------------------------------------------------------------------------------------------------------------------  Chemistries   Recent Labs Lab 06/19/15 0923 06/20/15 0537  NA 134* 135  K 3.8 4.2  CL 103 102  CO2 21* 26  GLUCOSE 105* 144*  BUN 46* 53*  CREATININE 1.73* 2.04*  CALCIUM 8.5* 8.3*  AST 53*  --   ALT 60*  --   ALKPHOS 309*  --   BILITOT 0.4  --    ------------------------------------------------------------------------------------------------------------------  Cardiac Enzymes  Recent Labs Lab 06/19/15 1601 06/19/15 2148  TROPONINI 2.29* 2.94*   ------------------------------------------------------------------------------------------------------------------  RADIOLOGY:  Dg Chest Port 1 View  06/19/2015  CLINICAL DATA:  Cold, cough and shortness of Breath for 9 days. EXAM: PORTABLE CHEST 1 VIEW COMPARISON:  None. FINDINGS: The heart is mildly enlarged. The mediastinal and hilar contours are grossly normal. Low lung volumes with vascular crowding and bibasilar atelectasis. Probable small bilateral pleural effusions. Right upper lobe infiltrate is noted. The bony thorax is intact. IMPRESSION: Right upper lobe pneumonia. Low lung volumes with vascular crowding, bibasilar atelectasis and small pleural effusions. Electronically Signed   By: Marijo Sanes M.D.   On: 06/19/2015 09:26    ASSESSMENT AND PLAN:   Active Problems:   A-fib ([facility])   Community acquired pneumonia   NSTEMI (non-ST elevated  myocardial infarction) ([facility])   Rapid atrial  fibrillation (HCC)   Congestive dilated cardiomyopathy (HCC)   Coronary artery disease involving native coronary artery of native heart without angina pectoris   Acute renal failure with tubular necrosis (HCC)   Pleural effusion  * Afib with RVR Likely due to pneumonia.  Rising serial troponins.   Rate controlled now on oral metoprolol. Checked TSH An mildly elevated. Free T3 slightly low, Free T4- normal.    Need to follow once infection clears - with her PMD. Check echocardiogram and appreciated consult cardiology. On heparin drip, aspirin, statin and beta blocker.  * NSTEMI    Troponin went high up to 2.94.     On Betablocker, statin, ASA    Cath can not be done now due to worsening renal func.    * RUL CAP on Levaquin. Wean oxygen as needed. Nebulizers as needed.  * ARF due to pneumonia. Has elevated BNP and some orthopnea and mild edema. No IV fluids at this point.   Monitor, avoid nephrotoxic meds.  * IDDM continue home dose of insulin along with sliding scale. Diabetic diet.  * DVT prophylaxis On heparin drip   All the records are reviewed and case discussed with Care Management/Social Workerr. Management plans discussed with the patient, family and they are in agreement.  CODE STATUS: FUll  TOTAL TIME TAKING CARE OF THIS PATIENT: 35 minutes.    POSSIBLE D/C IN 1-2 DAYS, DEPENDING ON CLINICAL CONDITION.   Vaughan Basta M.D on 06/20/2015   Between 7am to 6pm - Pager - [phone]  After 6pm go to [email or web address] - password [email or web address]  Office  [phone]  CC: Primary care physician; Pcp Not In System  Note: This dictation was prepared with Dragon dictation along with smaller phrase technology. Any transcriptional errors that result from this process are unintentional.

## 2015-06-20 NOTE — Care Management (Signed)
Patient moved to [facility] from [place] to be close to grandchildren. Has been here since August 2016 but she has spent more time in [place] than [place] over the last year and a half.  She does not have insurance.  she was only on insulin prior to this admission and received assistance through [employer] for her Lantus and Humalog insulin.  At present she has enough to get her trough March 2017.  Will investigate this medication assist program. She receives about 900 dollars a month.  She is in the process of selling her home in [place]

## 2015-06-21 DIAGNOSIS — I4891 Unspecified atrial fibrillation: Secondary | ICD-10-CM

## 2015-06-21 DIAGNOSIS — N170 Acute kidney failure with tubular necrosis: Secondary | ICD-10-CM

## 2015-06-21 DIAGNOSIS — I481 Persistent atrial fibrillation: Secondary | ICD-10-CM

## 2015-06-21 DIAGNOSIS — J189 Pneumonia, unspecified organism: Secondary | ICD-10-CM

## 2015-06-21 DIAGNOSIS — I420 Dilated cardiomyopathy: Secondary | ICD-10-CM

## 2015-06-21 DIAGNOSIS — I2510 Atherosclerotic heart disease of native coronary artery without angina pectoris: Secondary | ICD-10-CM

## 2015-06-21 LAB — GLUCOSE, CAPILLARY
GLUCOSE-CAPILLARY: 234 mg/dL — AB (ref 65–99)
Glucose-Capillary: 202 mg/dL — ABNORMAL HIGH (ref 65–99)
Glucose-Capillary: 164 mg/dL — ABNORMAL HIGH (ref 65–99)
Glucose-Capillary: 214 mg/dL — ABNORMAL HIGH (ref 65–99)

## 2015-06-21 LAB — BASIC METABOLIC PANEL
Anion gap: 7 (ref 5–15)
BUN: 52 mg/dL — AB (ref 6–20)
CALCIUM: 8.3 mg/dL — AB (ref 8.9–10.3)
CHLORIDE: 105 mmol/L (ref 101–111)
CO2: 24 mmol/L (ref 22–32)
CREATININE: 1.78 mg/dL — AB (ref 0.44–1.00)
GFR calc non Af Amer: 29 mL/min — ABNORMAL LOW (ref >60)
GFR, EST AFRICAN AMERICAN: 34 mL/min — AB (ref >60)
GLUCOSE: 208 mg/dL — AB (ref 65–99)
Potassium: 5 mmol/L (ref 3.5–5.1)
Sodium: 136 mmol/L (ref 135–145)

## 2015-06-21 LAB — CBC
HEMATOCRIT: 26.5 % — AB (ref 35.0–47.0)
Hemoglobin: 8.8 g/dL — ABNORMAL LOW (ref 12.0–16.0)
MCH: 28.4 pg (ref 26.0–34.0)
MCHC: 33.3 g/dL (ref 32.0–36.0)
MCV: 85.2 fL (ref 80.0–100.0)
PLATELETS: 377 10*3/uL (ref 150–440)
RBC: 3.11 MIL/uL — ABNORMAL LOW (ref 3.80–5.20)
RDW: 12.8 % (ref 11.5–14.5)
WBC: 9.3 10*3/uL (ref 3.6–11.0)

## 2015-06-21 LAB — HEMOGLOBIN A1C: HEMOGLOBIN A1C: 11.6 % — AB (ref 4.0–6.0)

## 2015-06-21 LAB — LIPID PANEL
CHOL/HDL RATIO: 5.4 ratio
Cholesterol: 222 mg/dL — ABNORMAL HIGH (ref 0–200)
HDL: 41 mg/dL (ref >40)
LDL CALC: 149 mg/dL — AB (ref 0–99)
Triglycerides: 162 mg/dL — ABNORMAL HIGH (ref <150)
VLDL: 32 mg/dL (ref 0–40)

## 2015-06-21 LAB — HEPARIN LEVEL (UNFRACTIONATED)
HEPARIN UNFRACTIONATED: 0.11 [IU]/mL — AB (ref 0.30–0.70)
Heparin Unfractionated: 0.25 IU/mL — ABNORMAL LOW (ref 0.30–0.70)
Heparin Unfractionated: 0.53 IU/mL (ref 0.30–0.70)

## 2015-06-21 MED ORDER — SODIUM CHLORIDE 0.9 % WEIGHT BASED INFUSION: 1.0000 mL/kg/h | INTRAVENOUS | Status: DC

## 2015-06-21 MED ORDER — HEPARIN (PORCINE) IN NACL 100-0.45 UNIT/ML-% IJ SOLN
1200.0000 [IU]/h | INTRAMUSCULAR | Status: DC
  Administered 2015-06-21 – 2015-06-26 (×5): 1200 [IU]/h via INTRAVENOUS
  Filled 2015-06-21 (×11): qty 250

## 2015-06-21 MED ORDER — ACETYLCYSTEINE 20 % IN SOLN
600.0000 mg | Freq: Three times a day (TID) | RESPIRATORY_TRACT | Status: AC
  Administered 2015-06-22 (×3): 600 mg via ORAL
  Filled 2015-06-21 (×5): qty 4

## 2015-06-21 MED ORDER — SODIUM CHLORIDE 0.9 % IJ SOLN: 3.0000 mL | INTRAMUSCULAR | Status: DC | PRN

## 2015-06-21 MED ORDER — SODIUM CHLORIDE 0.9 % IV SOLN: 250.0000 mL | INTRAVENOUS | Status: DC | PRN

## 2015-06-21 MED ORDER — HEPARIN (PORCINE) IN NACL 100-0.45 UNIT/ML-% IJ SOLN
1000.0000 [IU]/h | INTRAMUSCULAR | Status: DC
  Administered 2015-06-21: 1000 [IU]/h via INTRAVENOUS
  Filled 2015-06-21: qty 250

## 2015-06-21 MED ORDER — HEPARIN BOLUS VIA INFUSION
2000.0000 [IU] | Freq: Once | INTRAVENOUS | Status: AC
  Administered 2015-06-21: 2000 [IU] via INTRAVENOUS
  Filled 2015-06-21: qty 2000

## 2015-06-21 MED ORDER — SODIUM CHLORIDE 0.9 % IJ SOLN: 3.0000 mL | Freq: Two times a day (BID) | INTRAMUSCULAR | Status: DC

## 2015-06-21 MED ORDER — LEVOFLOXACIN 750 MG PO TABS
750.0000 mg | ORAL_TABLET | ORAL | Status: AC
  Administered 2015-06-23 – 2015-06-25 (×2): 750 mg via ORAL
  Filled 2015-06-21 (×2): qty 1

## 2015-06-21 MED ORDER — HEPARIN BOLUS VIA INFUSION
1000.0000 [IU] | Freq: Once | INTRAVENOUS | Status: AC
  Administered 2015-06-21: 1000 [IU] via INTRAVENOUS
  Filled 2015-06-21: qty 1000

## 2015-06-21 MED ORDER — SODIUM CHLORIDE 0.9 % WEIGHT BASED INFUSION
3.0000 mL/kg/h | INTRAVENOUS | Status: AC
  Administered 2015-06-22: 3 mL/kg/h via INTRAVENOUS

## 2015-06-21 MED ORDER — ASPIRIN 81 MG PO CHEW
81.0000 mg | CHEWABLE_TABLET | ORAL | Status: AC
  Administered 2015-06-22: 81 mg via ORAL
  Filled 2015-06-21: qty 1

## 2015-06-21 NOTE — Progress Notes (Signed)
Patient: Theresa Rogers / Admit Date: 06/19/2015 / Date of Encounter: 06/21/2015, 10:39 AM   Subjective: Breathing continues to improve. Less SOB. No chest pain. Renal function improved from 2.04-->1.78. HGB stable at 8.7-->8.8. Troponin peaked at 2.94. Still unable to lay fully supine.   Review of Systems: Review of Systems  Constitutional: Positive for weight loss and malaise/fatigue. Negative for fever, chills and diaphoresis.  HENT: Negative for congestion.   Eyes: Negative for discharge and redness.  Respiratory: Positive for cough, shortness of breath and wheezing. Negative for sputum production.   Cardiovascular: Positive for orthopnea and PND. Negative for chest pain, palpitations, claudication and leg swelling.  Gastrointestinal: Negative for nausea and vomiting.  Musculoskeletal: Negative for falls.  Skin: Negative for rash.  Neurological: Positive for weakness. Negative for sensory change, speech change and focal weakness.  Endo/Heme/Allergies: Does not bruise/bleed easily.  Psychiatric/Behavioral: The patient is not nervous/anxious.   All other systems reviewed and are negative.    Objective: Telemetry: sinus, 80's Physical Exam: Blood pressure 153/102, pulse 79, temperature 98.2 F (36.8 C), temperature source Oral, resp. rate 18, height 5\' 4"  (1.626 m), weight 157 lb 5.1 oz (71.36 kg), SpO2 90 %. Body mass index is 26.99 kg/(m^2). General: Well developed, well nourished, in no acute distress. Head: Normocephalic, atraumatic, sclera non-icteric, no xanthomas, nares are without discharge. Neck: Negative for carotid bruits. JVP not elevated. Lungs: Rhonchi left base with diffuse coarse breath sounds. Breathing is unlabored. Heart: RRR S1 S2 without murmurs, rubs, or gallops.  Abdomen: Soft, non-tender, non-distended with normoactive bowel sounds. No rebound/guarding. Extremities: No clubbing or cyanosis. No edema. Distal pedal pulses are 2+ and equal  bilaterally. Neuro: Alert and oriented X 3. Moves all extremities spontaneously. Psych:  Responds to questions appropriately with a normal affect.   Intake/Output Summary (Last 24 hours) at 06/21/15 1039 Last data filed at 06/21/15 0900  Gross per 24 hour  Intake 1109.25 ml  Output    500 ml  Net 609.25 ml    Inpatient Medications:  . aspirin EC  81 mg Oral Daily  . atorvastatin  40 mg Oral q1800  . docusate sodium  100 mg Oral BID  . insulin aspart  0-5 Units Subcutaneous QHS  . insulin aspart  0-9 Units Subcutaneous TID WC  . insulin glargine  20 Units Subcutaneous QHS  . levofloxacin (LEVAQUIN) IV  750 mg Intravenous Q48H  . metoprolol tartrate  25 mg Oral BID  . sodium chloride  3 mL Intravenous Q12H   Infusions:  . heparin 1,000 Units/hr (06/21/15 0840)    Labs:  Recent Labs  06/20/15 0537 06/21/15 0633  NA 135 136  K 4.2 5.0  CL 102 105  CO2 26 24  GLUCOSE 144* 208*  BUN 53* 52*  CREATININE 2.04* 1.78*  CALCIUM 8.3* 8.3*    Recent Labs  06/19/15 0923  AST 53*  ALT 60*  ALKPHOS 309*  BILITOT 0.4  PROT 6.5  ALBUMIN 3.1*    Recent Labs  06/20/15 0537 06/21/15 0633  WBC 9.2 9.3  HGB 8.7* 8.8*  HCT 25.3* 26.5*  MCV 85.0 85.2  PLT 348 377    Recent Labs  06/19/15 0923 06/19/15 1601 06/19/15 2148  TROPONINI 1.42* 2.29* 2.94*   Invalid input(s): POCBNP No results for input(s): HGBA1C in the last 72 hours.   Weights: Filed Weights   06/19/15 0901 06/20/15 0419 06/21/15 0433  Weight: 135 lb (61.236 kg) 162 lb 9.6 oz (73.755 kg) 157 lb  5.1 oz (71.36 kg)     Radiology/Studies:  Dg Chest Port 1 View  06/19/2015  CLINICAL DATA:  Cold, cough and shortness of Breath for 9 days. EXAM: PORTABLE CHEST 1 VIEW COMPARISON:  None. FINDINGS: The heart is mildly enlarged. The mediastinal and hilar contours are grossly normal. Low lung volumes with vascular crowding and bibasilar atelectasis. Probable small bilateral pleural effusions. Right upper lobe  infiltrate is noted. The bony thorax is intact. IMPRESSION: Right upper lobe pneumonia. Low lung volumes with vascular crowding, bibasilar atelectasis and small pleural effusions. Electronically Signed   By: Marijo Sanes M.D.   On: 06/19/2015 09:26     Assessment and Plan   1. New onset Afib with RVR: -Remains in sinus rhythm with HR in the 80's  -Continue Lopressor 25 mg bid for rate control -On heparin gtt for anticoagulation at this time given need for cardiac cath as below -Once she has completed her ischemic work up could use either Xarelto 15 mg q dinner (CrCl 32.18 mL/min upon admission) or Eliquis 5 mg bid (as SCr 1.73 only satisfies one of the 3 requirements to decrease to 2.5 mg bid)  2. NSTEMI: -Underlying coronary disease leading to this elevation in the setting of her RUL PNA and Afib with RVR -Continue heparin gtt at this time -Ideally needs a cardiac cath. Renal function has improved to 1.73 (approximately admission SCr). Would plan for possible cardiac cath on 1/12 if this continues to remain stable for another 24 hours at lunchtime with Dr. Fletcher Anon if she is able to lay fully supine, renal remains stable/improves, and anemia remains stable (pending interventional cardiology review) -Would need to hydrate pre-cath and limit dye  3. Sepsis in the setting of RUL PNA: -Improving, though with continued rhonchi  -Still unable to lay fully supine. Will need to try to lay supine today prior to scheduling cath on 1/12 -Patient meets at least 2/4 SIRS criteria (CBC not resulted), with source of infection known -Levaquin per IM  4. Diabetes: -SSI per IM  5. Anemia: -HCT 26 -Will need to monitor closely with need for long term, full-dose anticoagulation and possible need to antiplatelet therapy s/p cardiac cath -She is on heparin infusion without issues and stable hgb -Consider GI evaluation as outpatient   6. Pleural effusion: -Secondary to systolic CHF -Will need time to  reabsorb  7. Acute renal failure: -Improving  -Possible ATN -Will hold lasix as RVSP on echo is normal  8. Cardiomyopathy, systolic dysfunction: -EF 30% on echo, in NSR -Etiology not clear -Cath as above -Limited by renal function/anemia   Signed, Christell Faith, PA-C Pager: [phone] 06/21/2015, 10:39 AM  I have seen, examined and evaluated the patient this AM along with Mr. Idolina Primer, [place].  After reviewing all the available data and chart,  I agree with his findings, examination as well as impression recommendations.  Active Problems:   A-fib [facility])   Community acquired pneumonia   Rapid atrial fibrillation (HCC)   Congestive dilated cardiomyopathy (HCC)   Coronary artery disease involving native coronary artery of native heart without angina pectoris   Acute renal failure with tubular necrosis (HCC)   NSTEMI (non-ST elevated myocardial infarction) (HCC)   Pleural effusion  Overall she seems clinically improved. She still has some dyspnea and orthopnea. Dullness to percussion and egophony still in the left lung base. Renal function appears to be improving. Has not had any chest tightness or pressure since arrival. Is now in sinus rhythm having converted  from A. Fib.  At this point, think we need to at least ensure that her renal function has improved and is stable. It is much better today, but would like to see at least stable trajectory prior to considering invasive evaluation with cardiac catheterization. She does have a history of coronary disease, and therefore may very well have significant coronary disease on catheterization. With her baseline anemia and likely need for antiplatelet therapy for CAD plus evaluation for A. Fib, we need to investigate a possible etiology for her anemia before we start him into anticoagulation.  Appeared to be doing better clinically from a pneumonia standpoint, I think it her renal function is improved and she is able to lie flat by tomorrow we may  consider cath in the late morning. I will leave this up to the decision for Dr. Rockey Situ in the morning when he rounds her. Hold Lasix until renal function reassessed in the morning. We can determine if additional diuresis needed based on her LVEDP from catheterization.  She is on low-dose beta blocker with rate control. On statin and aspirin. Continue IV heparin until catheterization. After which time we can determine whether or not we continue heparin versus start DOAC.   I did spend several minutes discussing the cardiac indication procedure along with risks, benefits and alternatives with the patient. She has had a catheterization before and understands the concept of the procedure. She would agree to proceed. We discussed the potential complications of heart attack, stroke, unstable arrhythmia such as VF/VT with cardiac arrest. Also discussed worsening potential renal insufficiency from contrast nephropathy. Bleeding bruising was also discussed.    Leonie Man, M.D., M.S. Interventional Cardiologist   Pager # [phone]

## 2015-06-21 NOTE — Consult Note (Signed)
ANTICOAGULATION CONSULT NOTE -Follow-up  Pharmacy Consult for heparin Indication: atrial fibrillation  No Known Allergies  Patient Measurements: Height: 5\' 4"  (162.6 cm) Weight: 157 lb 5.1 oz (71.36 kg) IBW/kg (Calculated) : 54.7 Heparin Dosing Weight: 69.3 kg  Vital Signs: Temp: 98.2 F (36.8 C) (01/11 0433) Temp Source: Oral (01/11 0433) BP: 154/76 mmHg (01/11 0433) Pulse Rate: 73 (01/11 0433)  Labs:  Recent Labs  06/19/15 0923 06/19/15 1601 06/19/15 1721 06/19/15 2148 06/20/15 0537 06/20/15 0826 06/21/15 0633  HGB  --   --   --   --  8.7*  --  8.8*  HCT  --   --   --   --  25.3*  --  26.5*  PLT  --   --   --   --  348  --  377  APTT 28  --   --   --   --   --   --   LABPROT 13.4  --   --   --   --   --   --   INR 1.00  --   --   --   --   --   --   HEPARINUNFRC  --   --  0.37  --   --  0.38 0.25*  CREATININE 1.73*  --   --   --  2.04*  --  1.78*  TROPONINI 1.42* 2.29*  --  2.94*  --   --   --     Estimated Creatinine Clearance: 31.4 mL/min (by C-G formula based on Cr of 1.78).   Medical History: Past Medical History  Diagnosis Date  . CAD (coronary artery disease)     a. s/p MI/syncope in 2009 s/p remote stenting  . Diabetes mellitus without complication (HCC)     Medications:  Scheduled:  . aspirin EC  81 mg Oral Daily  . atorvastatin  40 mg Oral q1800  . docusate sodium  100 mg Oral BID  . heparin  1,000 Units Intravenous Once  . insulin aspart  0-5 Units Subcutaneous QHS  . insulin aspart  0-9 Units Subcutaneous TID WC  . insulin glargine  20 Units Subcutaneous QHS  . levofloxacin (LEVAQUIN) IV  750 mg Intravenous Q48H  . metoprolol tartrate  25 mg Oral BID  . sodium chloride  3 mL Intravenous Q12H   Infusions:  . heparin      Assessment: Theresa Rogers is a 65yo female with atrial fibrillation. Pharmacy consulted to dose heparin in this patient.  Goal of Therapy:  Heparin level 0.3-0.7 units/ml Monitor platelets by anticoagulation protocol: Yes    Plan:  Two consecutive heparin levels at goal so will continue heparin infusion at 900 units/hr and f/u am labs.   1/11: Heparin level @0633 =0.25.  Will give 1000 units bolus x 1 and increase drip rate to 1000 units/hr. Will recheck in 6 hrs and f/u CBC in am  Chinita Greenland PharmD Clinical Pharmacist 06/21/2015 7:48 AM

## 2015-06-21 NOTE — Consult Note (Signed)
ANTICOAGULATION CONSULT NOTE -Follow-up  Pharmacy Consult for heparin Indication: atrial fibrillation  No Known Allergies  Patient Measurements: Height: 5\' 4"  (162.6 cm) Weight: 157 lb 5.1 oz (71.36 kg) IBW/kg (Calculated) : 54.7 Heparin Dosing Weight: 69.3 kg  Vital Signs: Temp: 97.6 F (36.4 C) (01/11 1123) Temp Source: Oral (01/11 1123) BP: 146/80 mmHg (01/11 1123) Pulse Rate: 69 (01/11 1123)  Labs:  Recent Labs  06/19/15 0923 06/19/15 1601  06/19/15 2148 06/20/15 0537 06/20/15 0826 06/21/15 0633 06/21/15 1329  HGB  --   --   --   --  8.7*  --  8.8*  --   HCT  --   --   --   --  25.3*  --  26.5*  --   PLT  --   --   --   --  348  --  377  --   APTT 28  --   --   --   --   --   --   --   LABPROT 13.4  --   --   --   --   --   --   --   INR 1.00  --   --   --   --   --   --   --   HEPARINUNFRC  --   --   < >  --   --  0.38 0.25* 0.11*  CREATININE 1.73*  --   --   --  2.04*  --  1.78*  --   TROPONINI 1.42* 2.29*  --  2.94*  --   --   --   --   < > = values in this interval not displayed.  Estimated Creatinine Clearance: 31.4 mL/min (by C-G formula based on Cr of 1.78).   Medical History: Past Medical History  Diagnosis Date  . CAD (coronary artery disease)     a. s/p MI/syncope in 2009 s/p remote stenting  . Diabetes mellitus without complication (HCC)     Medications:  Scheduled:  . aspirin EC  81 mg Oral Daily  . atorvastatin  40 mg Oral q1800  . docusate sodium  100 mg Oral BID  . insulin aspart  0-5 Units Subcutaneous QHS  . insulin aspart  0-9 Units Subcutaneous TID WC  . insulin glargine  20 Units Subcutaneous QHS  . [START ON 06/23/2015] levofloxacin  750 mg Oral Q48H  . metoprolol tartrate  25 mg Oral BID  . sodium chloride  3 mL Intravenous Q12H   Infusions:  . heparin 1,200 Units/hr (06/21/15 1524)    Assessment: LM is a 65yo female with atrial fibrillation. Pharmacy consulted to dose heparin in this patient.  Goal of Therapy:  Heparin  level 0.3-0.7 units/ml Monitor platelets by anticoagulation protocol: Yes   Plan:  Two consecutive heparin levels at goal so will continue heparin infusion at 900 units/hr and f/u am labs.   1/11: Heparin level @0633 =0.25.  Will give 1000 units bolus x 1 and increase drip rate to 1000 units/hr. Will recheck in 6 hrs and f/u CBC in am  1/11: Heparin level @ 1329=0.11. Will give 2000 unit bolus and increase drip to 1200 units/hr. Will recheck in 6 hrs.  Chinita Greenland PharmD Clinical Pharmacist 06/21/2015 4:28 PM

## 2015-06-21 NOTE — Progress Notes (Signed)
[facility] at [place] NAME: Theresa Rogers    MR#:  [id number]  DATE OF BIRTH:  1950-08-22  SUBJECTIVE:  CHIEF COMPLAINT:   Chief Complaint  Patient presents with  . Shortness of Breath    Have pneumonia, and A fib with elevated troponin, Feels good now on room air. Remains on Heparin drip, renal func is little better. HR stable. No new complains.  REVIEW OF SYSTEMS:  CONSTITUTIONAL: No fever, positive for fatigue or weakness.  EYES: No blurred or double vision.  EARS, NOSE, AND THROAT: No tinnitus or ear pain.  RESPIRATORY: positive for cough & shortness of breath,no wheezing or hemoptysis.  CARDIOVASCULAR: No chest pain,positive for orthopnea, edema.  GASTROINTESTINAL: No nausea, vomiting, diarrhea or abdominal pain.  GENITOURINARY: No dysuria, hematuria.  ENDOCRINE: No polyuria, nocturia,  HEMATOLOGY: No anemia, easy bruising or bleeding SKIN: No rash or lesion. MUSCULOSKELETAL: No joint pain or arthritis.   NEUROLOGIC: No tingling, numbness, positive for generalized weakness.  PSYCHIATRY: No anxiety or depression.   ROS  DRUG ALLERGIES:  No Known Allergies  VITALS:  Blood pressure 155/76, pulse 79, temperature 99 F (37.2 C), temperature source Oral, resp. rate 24, height 5\' 4"  (1.626 m), weight 71.36 kg (157 lb 5.1 oz), SpO2 94 %.  PHYSICAL EXAMINATION:   GENERAL: 65 y.o.-year-old patient lying in the bed with no acute distress.  EYES: Pupils equal, round, reactive to light and accommodation. No scleral icterus. Extraocular muscles intact.  HEENT: Head atraumatic, normocephalic. Oropharynx and nasopharynx clear. No oropharyngeal erythema, moist oral mucosa  NECK: Supple, no jugular venous distention. No thyroid enlargement, no tenderness.  LUNGS: Increased work of breathing. Right ronchi. CARDIOVASCULAR: S1, S2 normal. Irregular. Tachycardic ABDOMEN: Soft, nontender, nondistended. Bowel sounds present. No  organomegaly or mass.  EXTREMITIES: No pedal edema, cyanosis, or clubbing. + 2 pedal & radial pulses b/l.  NEUROLOGIC: Cranial nerves II through XII are intact. No focal Motor or sensory deficits appreciated b/l PSYCHIATRIC: The patient is alert and oriented x 3. Good affect.  SKIN: No obvious rash, lesion, or ulcer.   Physical Exam LABORATORY PANEL:   CBC  Recent Labs Lab 06/21/15 0633  WBC 9.3  HGB 8.8*  HCT 26.5*  PLT 377   ------------------------------------------------------------------------------------------------------------------  Chemistries   Recent Labs Lab 06/19/15 0923  06/21/15 0633  NA 134*  < > 136  K 3.8  < > 5.0  CL 103  < > 105  CO2 21*  < > 24  GLUCOSE 105*  < > 208*  BUN 46*  < > 52*  CREATININE 1.73*  < > 1.78*  CALCIUM 8.5*  < > 8.3*  AST 53*  --   --   ALT 60*  --   --   ALKPHOS 309*  --   --   BILITOT 0.4  --   --   < > = values in this interval not displayed. ------------------------------------------------------------------------------------------------------------------  Cardiac Enzymes  Recent Labs Lab 06/19/15 1601 06/19/15 2148  TROPONINI 2.29* 2.94*   ------------------------------------------------------------------------------------------------------------------  RADIOLOGY:  No results found.  ASSESSMENT AND PLAN:   Active Problems:   A-fib ([place])   Community acquired pneumonia   NSTEMI (non-ST elevated myocardial infarction) ([place])   Rapid atrial fibrillation (HCC)   Congestive dilated cardiomyopathy (HCC)   Coronary artery disease involving native coronary artery of native heart without angina pectoris   Acute renal failure with tubular necrosis (HCC)   Pleural effusion  * Afib with RVR  Likely due to pneumonia.  Rising serial troponins.   Rate controlled now on oral metoprolol. Checked TSH An mildly elevated. Free T3 slightly low, Free T4- normal.    Need to follow once infection clears - with her  PMD. Check echocardiogram and appreciated consult cardiology. On heparin drip, aspirin, statin and beta blocker.   Will need to have Oral Anticoagulant on d/c.  * NSTEMI    Troponin went high up to 2.94.    On Betablocker, statin, ASA    Possible crdiac cath tomorrow- if renal func improves.    * RUL CAP on Levaquin. Wean oxygen as needed. Nebulizers as needed.   Much better now.  * ARF due to pneumonia. Has elevated BNP and some orthopnea and mild edema. No IV   fluids at this point.   Monitor, avoid nephrotoxic meds.   Will likely have cardiac cath tomorrow- will give mucomyst.  * IDDM continue home dose of insulin along with sliding scale. Diabetic diet.  * DVT prophylaxis On heparin drip   All the records are reviewed and case discussed with Care Management/Social Workerr. Management plans discussed with the patient, family and they are in agreement.  CODE STATUS: FUll  TOTAL TIME TAKING CARE OF THIS PATIENT: 35 minutes.  Discussed with cardiologist.  POSSIBLE D/C IN 1-2 DAYS, DEPENDING ON CLINICAL CONDITION.   Vaughan Basta M.D on 06/21/2015   Between 7am to 6pm - Pager - [phone]  After 6pm go to [email or web address] - password [email or web address] [facility]  Office  [phone]  CC: Primary care physician; Pcp Not In System  Note: This dictation was prepared with Dragon dictation along with smaller phrase technology. Any transcriptional errors that result from this process are unintentional.

## 2015-06-21 NOTE — Consult Note (Addendum)
ANTICOAGULATION CONSULT NOTE -Follow-up  Pharmacy Consult for heparin Indication: atrial fibrillation  No Known Allergies  Patient Measurements: Height: 5\' 4"  (162.6 cm) Weight: 157 lb 5.1 oz (71.36 kg) IBW/kg (Calculated) : 54.7 Heparin Dosing Weight: 69.3 kg  Vital Signs: Temp: 99 F (37.2 C) (01/11 2024) Temp Source: Oral (01/11 2024) BP: 155/76 mmHg (01/11 2024) Pulse Rate: 79 (01/11 2024)  Labs:  Recent Labs  06/19/15 0923 06/19/15 1601  06/19/15 2148 06/20/15 0537  06/21/15 0633 06/21/15 1329 06/21/15 2214  HGB  --   --   --   --  8.7*  --  8.8*  --   --   HCT  --   --   --   --  25.3*  --  26.5*  --   --   PLT  --   --   --   --  348  --  377  --   --   APTT 28  --   --   --   --   --   --   --   --   LABPROT 13.4  --   --   --   --   --   --   --   --   INR 1.00  --   --   --   --   --   --   --   --   HEPARINUNFRC  --   --   < >  --   --   < > 0.25* 0.11* 0.53  CREATININE 1.73*  --   --   --  2.04*  --  1.78*  --   --   TROPONINI 1.42* 2.29*  --  2.94*  --   --   --   --   --   < > = values in this interval not displayed.  Estimated Creatinine Clearance: 31.4 mL/min (by C-G formula based on Cr of 1.78).   Medical History: Past Medical History  Diagnosis Date  . CAD (coronary artery disease)     a. s/p MI/syncope in 2009 s/p remote stenting  . Diabetes mellitus without complication (HCC)     Medications:  Scheduled:   . acetylcysteine  600 mg Oral TID  . [START ON 06/22/2015] aspirin  81 mg Oral Pre-Cath  . aspirin EC  81 mg Oral Daily  . atorvastatin  40 mg Oral q1800  . docusate sodium  100 mg Oral BID  . insulin aspart  0-5 Units Subcutaneous QHS  . insulin aspart  0-9 Units Subcutaneous TID WC  . insulin glargine  20 Units Subcutaneous QHS  . [START ON 06/23/2015] levofloxacin  750 mg Oral Q48H  . metoprolol tartrate  25 mg Oral BID  . sodium chloride  3 mL Intravenous Q12H  . sodium chloride  3 mL Intravenous Q12H   Infusions:  . [START  ON 06/22/2015] sodium chloride     Followed by  . [START ON 06/22/2015] sodium chloride    . heparin 1,200 Units/hr (06/21/15 1524)    Assessment: Theresa Rogers is a 65yo female with atrial fibrillation. Pharmacy consulted to dose heparin in this patient.  Goal of Therapy:  Heparin level 0.3-0.7 units/ml Monitor platelets by anticoagulation protocol: Yes   Plan:  Two consecutive heparin levels at goal so will continue heparin infusion at 900 units/hr and f/u am labs.   1/11: Heparin level @0633 =0.25.  Will give 1000 units bolus x 1 and increase drip rate to 1000  units/hr. Will recheck in 6 hrs and f/u CBC in am  1/11: Heparin level @ 1329=0.11. Will give 2000 unit bolus and increase drip to 1200 units/hr. Will recheck in 6 hrs.  1/11 PM heparin level 0.53. Continue current regimen and recheck with AM labs tomorrow.  1/12 AM heparin level 0.61. Continue current regimen. Recheck with CBC tomorrow AM.  Sim Boast, PharmD, BCPS  06/22/2015

## 2015-06-21 NOTE — Consult Note (Signed)
ANTIBIOTIC CONSULT NOTE follow up  Pharmacy Consult for levofloxacin Indication: pneumonia  No Known Allergies  Patient Measurements: Height: 5' 4"  (162.6 cm) Weight: 157 lb 5.1 oz (71.36 kg) IBW/kg (Calculated) : 54.7 Adjusted Body Weight:   Vital Signs: Temp: 97.6 F (36.4 C) (01/11 1123) Temp Source: Oral (01/11 1123) BP: 146/80 mmHg (01/11 1123) Pulse Rate: 69 (01/11 1123) Intake/Output from previous day: 01/10 0701 - 01/11 0700 In: 1109.3 [P.O.:720; I.V.:389.3] Out: 600 [Urine:600] Intake/Output from this shift: Total I/O In: 240 [P.O.:240] Out: -   Labs:  Recent Labs  06/19/15 0923 06/20/15 0537 06/21/15 0633  WBC  --  9.2 9.3  HGB  --  8.7* 8.8*  PLT  --  348 377  CREATININE 1.73* 2.04* 1.78*   Estimated Creatinine Clearance: 31.4 mL/min (by C-G formula based on Cr of 1.78).   Microbiology: No results found for this or any previous visit (from the past 720 hour(s)).  Medical History: Past Medical History  Diagnosis Date  . CAD (coronary artery disease)     a. s/p MI/syncope in 2009 s/p remote stenting  . Diabetes mellitus without complication (HCC)     Medications:  Scheduled:  . aspirin EC  81 mg Oral Daily  . atorvastatin  40 mg Oral q1800  . docusate sodium  100 mg Oral BID  . insulin aspart  0-5 Units Subcutaneous QHS  . insulin aspart  0-9 Units Subcutaneous TID WC  . insulin glargine  20 Units Subcutaneous QHS  . [START ON 06/23/2015] levofloxacin  750 mg Oral Q48H  . metoprolol tartrate  25 mg Oral BID  . sodium chloride  3 mL Intravenous Q12H   Assessment: Pt is a 65 year old female who presents with SOB, fatigue, chills, cough tachycardic with afib. Chest x-rays shows pna. Pharmacy consulted to dose levofloxacin. Pt received 744m IV once in the ED.  Goal of Therapy:  resolution of infection  Plan:  Follow up culture results will start levofloxacin IV [id number] 48 hours due to renal function. Pharmacy to continue to monitor. Will  adjust as needed. Thank you for the consult.   1/11 Will transition to Levaquin 75061mO q48h as patient has met parameters for IV to PO per protocol.   KriChinita GreenlandarmD Clinical Pharmacist 06/21/2015 ,1:16 PM

## 2015-06-22 ENCOUNTER — Encounter
Admission: EM | Disposition: A | Discharge status: Home / Self Care | Payer: Self-pay | Source: Home / Self Care | Attending: Internal Medicine

## 2015-06-22 ENCOUNTER — Encounter: Payer: Self-pay | Admitting: Cardiovascular Disease

## 2015-06-22 HISTORY — PX: CARDIAC CATHETERIZATION: SHX172

## 2015-06-22 LAB — CBC
HEMATOCRIT: 23.9 % — AB (ref 35.0–47.0)
Hemoglobin: 7.9 g/dL — ABNORMAL LOW (ref 12.0–16.0)
MCH: 28.3 pg (ref 26.0–34.0)
MCHC: 33 g/dL (ref 32.0–36.0)
MCV: 85.8 fL (ref 80.0–100.0)
PLATELETS: 361 10*3/uL (ref 150–440)
RBC: 2.78 MIL/uL — ABNORMAL LOW (ref 3.80–5.20)
RDW: 12.9 % (ref 11.5–14.5)
WBC: 8.8 10*3/uL (ref 3.6–11.0)

## 2015-06-22 LAB — BASIC METABOLIC PANEL
ANION GAP: 4 — AB (ref 5–15)
BUN: 51 mg/dL — ABNORMAL HIGH (ref 6–20)
CALCIUM: 8 mg/dL — AB (ref 8.9–10.3)
CO2: 25 mmol/L (ref 22–32)
CREATININE: 1.97 mg/dL — AB (ref 0.44–1.00)
Chloride: 106 mmol/L (ref 101–111)
GFR, EST AFRICAN AMERICAN: 30 mL/min — AB (ref >60)
GFR, EST NON AFRICAN AMERICAN: 26 mL/min — AB (ref >60)
GLUCOSE: 162 mg/dL — AB (ref 65–99)
Potassium: 4.4 mmol/L (ref 3.5–5.1)
Sodium: 135 mmol/L (ref 135–145)

## 2015-06-22 LAB — GLUCOSE, CAPILLARY
GLUCOSE-CAPILLARY: 210 mg/dL — AB (ref 65–99)
Glucose-Capillary: 140 mg/dL — ABNORMAL HIGH (ref 65–99)
Glucose-Capillary: 180 mg/dL — ABNORMAL HIGH (ref 65–99)

## 2015-06-22 LAB — HEMOGLOBIN AND HEMATOCRIT, BLOOD
HEMATOCRIT: 28.7 % — AB (ref 35.0–47.0)
Hemoglobin: 9.5 g/dL — ABNORMAL LOW (ref 12.0–16.0)

## 2015-06-22 LAB — HEPARIN LEVEL (UNFRACTIONATED): Heparin Unfractionated: 0.61 IU/mL (ref 0.30–0.70)

## 2015-06-22 LAB — ABO/RH: ABO/RH(D): O POS

## 2015-06-22 LAB — PREPARE RBC (CROSSMATCH)

## 2015-06-22 SURGERY — LEFT HEART CATH AND CORONARY ANGIOGRAPHY
Anesthesia: Moderate Sedation

## 2015-06-22 MED ORDER — SODIUM CHLORIDE 0.9 % IV SOLN
INTRAVENOUS | Status: DC
  Administered 2015-06-22: 08:00:00 via INTRAVENOUS

## 2015-06-22 MED ORDER — ISOSORBIDE MONONITRATE ER 30 MG PO TB24
30.0000 mg | ORAL_TABLET | Freq: Two times a day (BID) | ORAL | Status: DC
  Administered 2015-06-22 – 2015-06-27 (×11): 30 mg via ORAL
  Filled 2015-06-22 (×11): qty 1

## 2015-06-22 MED ORDER — MIDAZOLAM HCL 2 MG/2ML IJ SOLN
INTRAMUSCULAR | Status: DC | PRN
  Administered 2015-06-22: 0.5 mg via INTRAVENOUS

## 2015-06-22 MED ORDER — FENTANYL CITRATE (PF) 100 MCG/2ML IJ SOLN
INTRAMUSCULAR | Status: DC | PRN
  Administered 2015-06-22: 25 ug via INTRAVENOUS

## 2015-06-22 MED ORDER — ACETAMINOPHEN 325 MG PO TABS: 650.0000 mg | ORAL_TABLET | ORAL | Status: DC | PRN

## 2015-06-22 MED ORDER — MIDAZOLAM HCL 2 MG/2ML IJ SOLN
INTRAMUSCULAR | Status: AC
  Filled 2015-06-22: qty 2

## 2015-06-22 MED ORDER — SODIUM CHLORIDE 0.9 % IV SOLN
Freq: Once | INTRAVENOUS | Status: AC
  Administered 2015-06-22: 17:00:00 via INTRAVENOUS

## 2015-06-22 MED ORDER — ONDANSETRON HCL 4 MG/2ML IJ SOLN: 4.0000 mg | Freq: Four times a day (QID) | INTRAMUSCULAR | Status: DC | PRN

## 2015-06-22 MED ORDER — SODIUM CHLORIDE 0.9 % WEIGHT BASED INFUSION: 3.0000 mL/kg/h | INTRAVENOUS | Status: AC

## 2015-06-22 MED ORDER — SODIUM CHLORIDE 0.9 % IV SOLN: INTRAVENOUS | Status: DC

## 2015-06-22 MED ORDER — FENTANYL CITRATE (PF) 100 MCG/2ML IJ SOLN
INTRAMUSCULAR | Status: AC
  Filled 2015-06-22: qty 2

## 2015-06-22 MED ORDER — METOPROLOL TARTRATE 50 MG PO TABS
50.0000 mg | ORAL_TABLET | Freq: Two times a day (BID) | ORAL | Status: DC
  Administered 2015-06-22 – 2015-06-25 (×6): 50 mg via ORAL
  Filled 2015-06-22 (×6): qty 1

## 2015-06-22 MED ORDER — IOHEXOL 300 MG/ML  SOLN
INTRAMUSCULAR | Status: DC | PRN
  Administered 2015-06-22: 90 mL via INTRA_ARTERIAL

## 2015-06-22 SURGICAL SUPPLY — 9 items
CATH INFINITI 5FR ANG PIGTAIL (CATHETERS) IMPLANT
CATH INFINITI 5FR JL4 (CATHETERS) ×3 IMPLANT
CATH INFINITI JR4 5F (CATHETERS) ×3 IMPLANT
DEVICE CLOSURE MYNXGRIP 5F (Vascular Products) ×3 IMPLANT
KIT MANI 3VAL PERCEP (MISCELLANEOUS) ×3 IMPLANT
NEEDLE PERC 18GX7CM (NEEDLE) ×3 IMPLANT
PACK CARDIAC CATH (CUSTOM PROCEDURE TRAY) ×3 IMPLANT
SHEATH AVANTI 5FR X 11CM (SHEATH) ×3 IMPLANT
WIRE EMERALD 3MM-J .035X150CM (WIRE) ×3 IMPLANT

## 2015-06-22 NOTE — Progress Notes (Addendum)
[facility] at [place] NAME: Theresa Rogers    MR#:  [id number]  DATE OF BIRTH:  1950-07-02  SUBJECTIVE:  CHIEF COMPLAINT:   Chief Complaint  Patient presents with  . Shortness of Breath    Have pneumonia, and A fib with elevated troponin,  Patient had cardiac catheterization done today with three-vessel disease. Remains on Heparin drip, renal func is little better. HR stable. Reporting little shortness of breath with exertion following card a catheterization but denies any chest pain.  REVIEW OF SYSTEMS:  CONSTITUTIONAL: No fever, positive for fatigue or weakness.  EYES: No blurred or double vision.  EARS, NOSE, AND THROAT: No tinnitus or ear pain.  RESPIRATORY: positive for cough & shortness of breath,no wheezing or hemoptysis.  CARDIOVASCULAR: No chest pain,positive for orthopnea, edema.  GASTROINTESTINAL: No nausea, vomiting, diarrhea or abdominal pain.  GENITOURINARY: No dysuria, hematuria.  ENDOCRINE: No polyuria, nocturia,  HEMATOLOGY: No anemia, easy bruising or bleeding SKIN: No rash or lesion. MUSCULOSKELETAL: No joint pain or arthritis.   NEUROLOGIC: No tingling, numbness, positive for generalized weakness.  PSYCHIATRY: No anxiety or depression.   ROS  DRUG ALLERGIES:  No Known Allergies  VITALS:  Blood pressure 172/72, pulse 72, temperature 97.9 F (36.6 C), temperature source Oral, resp. rate 18, height 5\' 4"  (1.626 m), weight 72.122 kg (159 lb), SpO2 97 %.  PHYSICAL EXAMINATION:   GENERAL: 65 y.o.-year-old patient lying in the bed with no acute distress.  EYES: Pupils equal, round, reactive to light and accommodation. No scleral icterus. Extraocular muscles intact.  HEENT: Head atraumatic, normocephalic. Oropharynx and nasopharynx clear. No oropharyngeal erythema, moist oral mucosa  NECK: Supple, no jugular venous distention. No thyroid enlargement, no tenderness.  LUNGS: Increased work of breathing.  Right ronchi. CARDIOVASCULAR: S1, S2 normal. Irregular. Tachycardic ABDOMEN: Soft, nontender, nondistended. Bowel sounds present. No organomegaly or mass.  EXTREMITIES: No pedal edema, cyanosis, or clubbing. + 2 pedal & radial pulses b/l.  NEUROLOGIC: Cranial nerves II through XII are intact. No focal Motor or sensory deficits appreciated b/l PSYCHIATRIC: The patient is alert and oriented x 3. Good affect.  SKIN: No obvious rash, lesion, or ulcer.   Physical Exam LABORATORY PANEL:   CBC  Recent Labs Lab 06/22/15 0408  WBC 8.8  HGB 7.9*  HCT 23.9*  PLT 361   ------------------------------------------------------------------------------------------------------------------  Chemistries   Recent Labs Lab 06/19/15 0923  06/22/15 0408  NA 134*  < > 135  K 3.8  < > 4.4  CL 103  < > 106  CO2 21*  < > 25  GLUCOSE 105*  < > 162*  BUN 46*  < > 51*  CREATININE 1.73*  < > 1.97*  CALCIUM 8.5*  < > 8.0*  AST 53*  --   --   ALT 60*  --   --   ALKPHOS 309*  --   --   BILITOT 0.4  --   --   < > = values in this interval not displayed. ------------------------------------------------------------------------------------------------------------------  Cardiac Enzymes  Recent Labs Lab 06/19/15 1601 06/19/15 2148  TROPONINI 2.29* 2.94*   ------------------------------------------------------------------------------------------------------------------  RADIOLOGY:  No results found.  ASSESSMENT AND PLAN:   Active Problems:   A-fib ([place])   Community acquired pneumonia   NSTEMI (non-ST elevated myocardial infarction) ([place])   Rapid atrial fibrillation (HCC)   Congestive dilated cardiomyopathy (HCC)   Coronary artery disease involving native coronary artery of native heart without angina pectoris   Acute  renal failure with tubular necrosis (HCC)   Pleural effusion   NSTEMI    Troponin went high up to 2.94. Patient had cardiac catheterization today, 06/21/1998 617 with  three-vessel disease, cardiology has rescheduled for repeat cardiac catheterization on Monday. Planning to do stepwise stenting in view of worsening of renal function    On Betablocker, statin, ASA    Continue heparin drip Will consider blood transfusion in view of non-STEMI with anemia     * Afib with RVR Likely due to pneumonia.  Rising serial troponins.   Rate controlled now on oral metoprolol. Checked TSH An mildly elevated. Free T3 slightly low, Free T4- normal. Repeat thyroid function tests in 6 weeks after discharge  Vital primary care physician Check echocardiogram and appreciat consult cardiology. On heparin drip, aspirin, statin and beta blocker.   Will need to have Oral Anticoagulant on d/c.  * Essential hypertension with elevated blood pressures Imdur dose is increased to 30 mg by mouth twice a day and will consider to increase metoprolol to 50 mg by mouth twice a day If no clinical improvement     * RUL CAP on Levaquin. Wean oxygen as needed. Nebulizers as needed.   Much better now.  * ARF due to pneumonia. Has elevated BNP and some orthopnea and mild edema. No IV   fluids at this point.   Monitor, avoid nephrotoxic meds. Consult nephrology if renal function is getting worse    * IDDM-uncontrolled with hemoglobin A1c at 11.6 continue home dose of insulin along with sliding scale. Diabetic diet.  * DVT prophylaxis On heparin drip   All the records are reviewed and case discussed with Care Management/Social Workerr. Management plans discussed with the patient, family and they are in agreement.  CODE STATUS: FUll  TOTAL TIME TAKING CARE OF THIS PATIENT: 35 minutes.  Discussed with cardiologist.  POSSIBLE D/C IN 3-4  DAYS, DEPENDING ON CLINICAL CONDITION.   Nicholes Mango M.D on 06/22/2015   Between 7am to 6pm - Pager - [phone]  After 6pm go to [email or web address] - password [email or web address] [facility]  Office  [phone]  CC: Primary  care physician; Pcp Not In System  Note: This dictation was prepared with Dragon dictation along with smaller phrase technology. Any transcriptional errors that result from this process are unintentional.

## 2015-06-22 NOTE — Progress Notes (Signed)
Pt doing well post op cath, clinically stable, with 3vd, for potential intervention at later date, right groin with mynx closure, no bleeding nor hematoma, left ac iv discontinued,nonfunctional for 2days,and leaking upon arrival, new iv placed upon arrival to specials. Vss, pt aware of cath results, with Dr Rockey Situ speaking with her. Report called with orders and plan reviewed, to 2a care nurse.

## 2015-06-22 NOTE — Progress Notes (Signed)
Inpatient Diabetes Program Recommendations  AACE/ADA: New Consensus Statement on Inpatient Glycemic Control (2015)  Target Ranges:  Prepandial:   less than 140 mg/dL      Peak postprandial:   less than 180 mg/dL (1-2 hours)      Critically ill patients:  140 - 180 mg/dL   Review of Glycemic Control  Spoke with patient regarding A1C of 11.6%.  She tells me her last A1C was 9% and attributes this higher A1C to the holidays. Last MD visit was in August in [place].  Discussed the long term complications of uncontrolled blood sugars.  She tells me she was taking her medications as ordered and will therefore require an adjustment to her insulin before discharge.  Discussed [facility] and [facility] management clinic to her as had previously been done with the Case Manager.  Patient receptive to information.   Gentry Fitz, RN, BA, MHA, CDE Diabetes Coordinator Inpatient Diabetes Program  [phone] (Team Pager) [phone] ([place]) 06/22/2015 3:17 PM

## 2015-06-22 NOTE — Care Management (Signed)
Patient had cardiac cath today and is going to require PCI.  Will require renal protection protocol prior to procedure due to need for the dye and current renal function.  Investigated "Med Access" financial assist and it appears this is  a program in [place].   Questioned patient again she does not know if she has registered with the drug company.  Provided patient with [facility] application and faxed face sheet.  Agency can be a resource to provide assistance with lantus financial assistance. She repeats that she does have enough to last until March.  Discussed to make completion of the application a priority.  it is entirely possible that she may require higher priced antiplatelet or anticoagulant after procedure

## 2015-06-22 NOTE — Progress Notes (Signed)
Cardiac catheterization report Please see full report for details Patient has three-vessel disease, 3 severe lesions in the RCA, severe disease in the mid circumflex, moderate to severe lesion in the LAD  Case discussed with Dr. Fletcher Anon,  she would likely not be a good surgical candidate as the distal LAD and RCA are not amenable to bypass grafting  We'll give IV fluids today, close monitoring of her renal function  Plan is for repeat catheterization on Monday  possible FFR of the LAD to determine if this is severe enough to warrant stenting, Stents to the circumflex possibly on Monday We'll need to consider stenting of the RCA either on Monday or later date,  May need to to stage procedure given her severe renal dysfunction  Ideally should have transfusion between now and catheterization next week given her profound anemia  Blood pressure was very elevated during the case, I started isosorbide mononitrate 30 mg twice a day Will discuss with Dr. Fletcher Anon whether  he would like to start brilinta

## 2015-06-22 NOTE — Progress Notes (Signed)
Inpatient Diabetes Program Recommendations  AACE/ADA: New Consensus Statement on Inpatient Glycemic Control (2015)  Target Ranges:  Prepandial:   less than 140 mg/dL      Peak postprandial:   less than 180 mg/dL (1-2 hours)      Critically ill patients:  140 - 180 mg/dL   Review of Glycemic Control  Results for FELIX, ADAMS (MRN [id number]) as of 06/22/2015 14:21  Ref. Range 06/20/2015 23:22 06/21/2015 07:14 06/21/2015 11:24 06/21/2015 16:01 06/21/2015 21:15 06/22/2015 10:52  Glucose-Capillary Latest Ref Range: 65-99 mg/dL 160 (H) 202 (H) 234 (H) 164 (H) 214 (H) 140 (H)   Results for LYNELL, BENZIE (MRN [id number]) as of 06/22/2015 14:21  Ref. Range 06/21/2015 13:29  Hemoglobin A1C Latest Ref Range: 4.0-6.0 % 11.6 (H)    Outpatient Diabetes medications: Lantus 20 units qday, Humalog 8-12 units tid Current orders for Inpatient glycemic control: Lantus 20 units qhs, Novolog 0-9 units tid, Novolog 0-5 units qhs  Inpatient Diabetes Program Recommendations:  Agree with current orders for diabetes management.  Will follow.  Gentry Fitz, RN, BA, MHA, CDE Diabetes Coordinator Inpatient Diabetes Program  [phone] (Team Pager) [phone] ([employer]) 06/22/2015 2:23 PM

## 2015-06-23 LAB — GLUCOSE, CAPILLARY
GLUCOSE-CAPILLARY: 221 mg/dL — AB (ref 65–99)
Glucose-Capillary: 90 mg/dL (ref 65–99)
Glucose-Capillary: 133 mg/dL — ABNORMAL HIGH (ref 65–99)
Glucose-Capillary: 213 mg/dL — ABNORMAL HIGH (ref 65–99)

## 2015-06-23 LAB — CBC
HEMATOCRIT: 29.1 % — AB (ref 35.0–47.0)
HEMOGLOBIN: 9.7 g/dL — AB (ref 12.0–16.0)
MCH: 28.7 pg (ref 26.0–34.0)
MCHC: 33.3 g/dL (ref 32.0–36.0)
MCV: 86 fL (ref 80.0–100.0)
PLATELETS: 399 10*3/uL (ref 150–440)
RBC: 3.38 MIL/uL — AB (ref 3.80–5.20)
RDW: 13.2 % (ref 11.5–14.5)
WBC: 10.1 10*3/uL (ref 3.6–11.0)

## 2015-06-23 LAB — TYPE AND SCREEN
ABO/RH(D): O POS
ANTIBODY SCREEN: NEGATIVE
UNIT DIVISION: 0

## 2015-06-23 LAB — BASIC METABOLIC PANEL
ANION GAP: 8 (ref 5–15)
BUN: 35 mg/dL — ABNORMAL HIGH (ref 6–20)
CHLORIDE: 102 mmol/L (ref 101–111)
CO2: 23 mmol/L (ref 22–32)
Calcium: 8 mg/dL — ABNORMAL LOW (ref 8.9–10.3)
Creatinine, Ser: 1.6 mg/dL — ABNORMAL HIGH (ref 0.44–1.00)
GFR calc Af Amer: 39 mL/min — ABNORMAL LOW (ref >60)
GFR, EST NON AFRICAN AMERICAN: 33 mL/min — AB (ref >60)
GLUCOSE: 103 mg/dL — AB (ref 65–99)
POTASSIUM: 3.8 mmol/L (ref 3.5–5.1)
Sodium: 133 mmol/L — ABNORMAL LOW (ref 135–145)

## 2015-06-23 LAB — HEPARIN LEVEL (UNFRACTIONATED): Heparin Unfractionated: 0.62 IU/mL (ref 0.30–0.70)

## 2015-06-23 NOTE — Progress Notes (Signed)
[facility] at [place] NAME: Theresa Rogers    MR#:  [id number]  DATE OF BIRTH:  1951-06-04  SUBJECTIVE:  CHIEF COMPLAINT:   Chief Complaint  Patient presents with  . Shortness of Breath    Have pneumonia, and A fib with elevated troponin,  Patient had cardiac catheterization done 01/12/2017with three-vessel disease. Remains on Heparin drip, renal func is little better. HR stable. Denies any chest pain or shortness of breath.. Repeat cardiac catheterization is scheduled for Monday  REVIEW OF SYSTEMS:  CONSTITUTIONAL: No fever, positive for fatigue or weakness.  EYES: No blurred or double vision.  EARS, NOSE, AND THROAT: No tinnitus or ear pain.  RESPIRATORY: positive for cough & shortness of breath,no wheezing or hemoptysis.  CARDIOVASCULAR: No chest pain,positive for orthopnea, edema.  GASTROINTESTINAL: No nausea, vomiting, diarrhea or abdominal pain.  GENITOURINARY: No dysuria, hematuria.  ENDOCRINE: No polyuria, nocturia,  HEMATOLOGY: No anemia, easy bruising or bleeding SKIN: No rash or lesion. MUSCULOSKELETAL: No joint pain or arthritis.   NEUROLOGIC: No tingling, numbness, positive for generalized weakness.  PSYCHIATRY: No anxiety or depression.   ROS  DRUG ALLERGIES:  No Known Allergies  VITALS:  Blood pressure 156/74, pulse 62, temperature 98.1 F (36.7 C), temperature source Oral, resp. rate 18, height 5\' 4"  (1.626 m), weight 75.297 kg (166 lb), SpO2 93 %.  PHYSICAL EXAMINATION:   GENERAL: 65 y.o.-year-old patient lying in the bed with no acute distress.  EYES: Pupils equal, round, reactive to light and accommodation. No scleral icterus. Extraocular muscles intact.  HEENT: Head atraumatic, normocephalic. Oropharynx and nasopharynx clear. No oropharyngeal erythema, moist oral mucosa  NECK: Supple, no jugular venous distention. No thyroid enlargement, no tenderness.  LUNGS: Increased work of breathing. Right  ronchi. CARDIOVASCULAR: S1, S2 normal. Irregular. Tachycardic ABDOMEN: Soft, nontender, nondistended. Bowel sounds present. No organomegaly or mass.  EXTREMITIES: No pedal edema, cyanosis, or clubbing. + 2 pedal & radial pulses b/l.  NEUROLOGIC: Cranial nerves II through XII are intact. No focal Motor or sensory deficits appreciated b/l PSYCHIATRIC: The patient is alert and oriented x 3. Good affect.  SKIN: No obvious rash, lesion, or ulcer.   Physical Exam LABORATORY PANEL:   CBC  Recent Labs Lab 06/23/15 0608  WBC 10.1  HGB 9.7*  HCT 29.1*  PLT 399   ------------------------------------------------------------------------------------------------------------------  Chemistries   Recent Labs Lab 06/19/15 0923  06/23/15 0608  NA 134*  < > 133*  K 3.8  < > 3.8  CL 103  < > 102  CO2 21*  < > 23  GLUCOSE 105*  < > 103*  BUN 46*  < > 35*  CREATININE 1.73*  < > 1.60*  CALCIUM 8.5*  < > 8.0*  AST 53*  --   --   ALT 60*  --   --   ALKPHOS 309*  --   --   BILITOT 0.4  --   --   < > = values in this interval not displayed. ------------------------------------------------------------------------------------------------------------------  Cardiac Enzymes  Recent Labs Lab 06/19/15 1601 06/19/15 2148  TROPONINI 2.29* 2.94*   ------------------------------------------------------------------------------------------------------------------  RADIOLOGY:  No results found.  ASSESSMENT AND PLAN:   Active Problems:   A-fib ([place])   Community acquired pneumonia   NSTEMI (non-ST elevated myocardial infarction) ([place])   Rapid atrial fibrillation (HCC)   Congestive dilated cardiomyopathy (HCC)   Coronary artery disease involving native coronary artery of native heart without angina pectoris   Acute renal failure  with tubular necrosis (HCC)   Pleural effusion   NSTEMI    Troponin went high up to 2.94. Patient had cardiac catheterization  06/21/1998 617 with three-vessel  disease, cardiology has scheduled for repeat cardiac catheterization on Monday. Planning to do stepwise stenting in view of worsening of renal function    On Betablocker, statin, ASA    Continue heparin drip Patient received 1 unit of blood transfusion yesterday and hemoglobin is at 9.7 today  * Afib with RVR Likely due to pneumonia.    Rate controlled on oral metoprolol. Checked TSH An mildly elevated. Free T3 slightly low, Free T4- normal. Repeat thyroid function tests in 6 weeks after discharge  Vital primary care physician Check echocardiogram and appreciat consult cardiology. On heparin drip, aspirin, statin and beta blocker.   Will need to have Oral Anticoagulant xarelto on d/c. Cardiology is recommending to continue aspirin, Plavix for one month, then would d/c aspirin, start xarelto with plavix  * Essential hypertension with elevated blood pressures Imdur dose is increased to 30 mg by mouth twice a day and will consider to increase metoprolol to 50 mg by mouth twice a day If no clinical improvement     * RUL CAP on Levaquin. Wean oxygen as needed. Nebulizers as needed.   Much better now.  * ARF due to pneumonia. Has elevated BNP and some orthopnea and mild edema. No IV   fluids at this point.   Monitor, avoid nephrotoxic meds. Consult nephrology if renal function is getting worse    * IDDM-uncontrolled with hemoglobin A1c at 11.6 continue home dose of insulin along with sliding scale. Diabetic diet.  * DVT prophylaxis On heparin drip   All the records are reviewed and case discussed with Care Management/Social Workerr. Management plans discussed with the patient, family and they are in agreement.  CODE STATUS: FUll  TOTAL TIME TAKING CARE OF THIS PATIENT: 35 minutes.  Discussed with cardiologist.  POSSIBLE D/C IN 3-4  DAYS, DEPENDING ON CLINICAL CONDITION.   Nicholes Mango M.D on 06/23/2015   Between 7am to 6pm - Pager - [phone]  After 6pm go to  [email or web address] - password [email or web address] [facility]  Office  [phone]  CC: Primary care physician; Pcp Not In System  Note: This dictation was prepared with Dragon dictation along with smaller phrase technology. Any transcriptional errors that result from this process are unintentional.

## 2015-06-23 NOTE — Consult Note (Addendum)
ANTIBIOTIC CONSULT NOTE follow up  Pharmacy Consult for levofloxacin Indication: pneumonia (CAP)  No Known Allergies  Patient Measurements: Height: _0  (162.6 cm) Weight: 166 lb (75.297 kg) IBW/kg (Calculated) : 54.7 Adjusted Body Weight:   Vital Signs: Temp: 98.5 F (36.9 C) (01/13 0950) Temp Source: Oral (01/13 0950) BP: 158/68 mmHg (01/13 0950) Pulse Rate: 73 (01/13 0950) Intake/Output from previous day: 01/12 0701 - 01/13 0700 In: 1630 [P.O.:600; I.V.:500; Blood:530] Out: 400 [Urine:400] Intake/Output from this shift: Total I/O In: 240 [P.O.:240] Out: -   Labs:  Recent Labs  06/21/15 0633 06/22/15 0408 06/22/15 2233 06/23/15 0608  WBC 9.3 8.8  --  10.1  HGB 8.8* 7.9* 9.5* 9.7*  PLT 377 361  --  399  CREATININE 1.78* 1.97*  --  1.60*   Estimated Creatinine Clearance: 35.7 mL/min (by C-G formula based on Cr of 1.6).   Microbiology: No results found for this or any previous visit (from the past 720 hour(s)).  Assessment: Pt is a 65 year old female who presents with SOB, fatigue, chills, cough tachycardic with afib. Chest x-rays shows pna. Pharmacy consulted to dose levofloxacin. Pt received 72m IV once in the ED.  Goal of Therapy:  resolution of infection  Plan:  Follow up culture results will start levofloxacin IV [id number] 48 hours due to renal function. Pharmacy to continue to monitor. Will adjust as needed. Thank you for the consult.   1/11 Transitioned to Levaquin [id number] q48h as patient has met parameters for IV to PO per protocol.  Will continue at this dose based on current renal function.    HanRayna SextonharmD, BCPS Clinical Pharmacist 06/23/2015 11:25 AM   Addendum: After discussion with Dr. GouMargaretmary Eddyill put in a stop date for 1/15 for total of 7 days treatment with levofloxacin.   HanRayna SextonharmD, BCPS Clinical Pharmacist 06/23/2015 3:17 PM

## 2015-06-23 NOTE — Progress Notes (Signed)
Patient: Theresa Rogers / Admit Date: 06/19/2015 / Date of Encounter: 06/23/2015, 8:35 AM   Subjective: Cardiac catheterization yesterday, Received blood last night 3 vessel CAD on cath Plan for cath/PCI on Monday  Review of Systems: Review of Systems  Constitutional: Negative.   Respiratory: Negative.   Cardiovascular: Negative.   Gastrointestinal: Negative.   Musculoskeletal: Negative.   Neurological: Negative.   Psychiatric/Behavioral: Negative.   All other systems reviewed and are negative.    Objective: Telemetry: NSR Physical Exam: Blood pressure 158/79, pulse 64, temperature 98.3 F (36.8 C), temperature source Oral, resp. rate 22, height 5\' 4"  (1.626 m), weight 166 lb (75.297 kg), SpO2 96 %. Body mass index is 28.48 kg/(m^2). General: Well developed, well nourished, in no acute distress, appears pale Head: Normocephalic, atraumatic, sclera non-icteric, no xanthomas, nares are without discharge. Neck: Negative for carotid bruits. JVD not elevated. Lungs: Right upper lobe rhonchi. Breathing is unlabored. Heart: RRR with S1 S2. No murmurs, rubs, or gallops appreciated. Abdomen: Soft, non-tender, non-distended with normoactive bowel sounds. No hepatomegaly. No rebound/guarding. No obvious abdominal masses. Msk: Strength and tone appear normal for age. Extremities: No clubbing or cyanosis. No edema. Distal pedal pulses are 2+ and equal bilaterally. Neuro: Alert and oriented X 3. No facial asymmetry. No focal deficit. Moves all extremities spontaneously. Psych: Responds to questions appropriately with a normal affect.   Intake/Output Summary (Last 24 hours) at 06/23/15 0835 Last data filed at 06/22/15 2219  Gross per 24 hour  Intake   1630 ml  Output    400 ml  Net   1230 ml    Inpatient Medications:  . acetylcysteine  600 mg Oral TID  . aspirin EC  81 mg Oral Daily  . atorvastatin  40 mg Oral q1800  . docusate sodium  100 mg Oral BID  . insulin  aspart  0-5 Units Subcutaneous QHS  . insulin aspart  0-9 Units Subcutaneous TID WC  . insulin glargine  20 Units Subcutaneous QHS  . isosorbide mononitrate  30 mg Oral BID  . levofloxacin  750 mg Oral Q48H  . metoprolol tartrate  50 mg Oral BID  . sodium chloride  3 mL Intravenous Q12H   Infusions:  . sodium chloride 500 mL/hr at 06/22/15 0744  . heparin 1,200 Units/hr (06/22/15 1146)    Labs:  Recent Labs  06/22/15 0408 06/23/15 0608  NA 135 133*  K 4.4 3.8  CL 106 102  CO2 25 23  GLUCOSE 162* 103*  BUN 51* 35*  CREATININE 1.97* 1.60*  CALCIUM 8.0* 8.0*   No results for input(s): AST, ALT, ALKPHOS, BILITOT, PROT, ALBUMIN in the last 72 hours.  Recent Labs  06/22/15 0408 06/22/15 2233 06/23/15 0608  WBC 8.8  --  10.1  HGB 7.9* 9.5* 9.7*  HCT 23.9* 28.7* 29.1*  MCV 85.8  --  86.0  PLT 361  --  399   No results for input(s): CKTOTAL, CKMB, TROPONINI in the last 72 hours. Invalid input(s): POCBNP  Recent Labs  06/21/15 1329  HGBA1C 11.6*     Weights: Filed Weights   06/22/15 0500 06/22/15 0717 06/23/15 0444  Weight: 159 lb (72.122 kg) 159 lb (72.122 kg) 166 lb (75.297 kg)     Radiology/Studies:  Dg Chest Port 1 View  06/19/2015  CLINICAL DATA:  Cold, cough and shortness of Breath for 9 days. EXAM: PORTABLE CHEST 1 VIEW COMPARISON:  None. FINDINGS: The heart is mildly enlarged. The mediastinal and hilar contours are  grossly normal. Low lung volumes with vascular crowding and bibasilar atelectasis. Probable small bilateral pleural effusions. Right upper lobe infiltrate is noted. The bony thorax is intact. IMPRESSION: Right upper lobe pneumonia. Low lung volumes with vascular crowding, bibasilar atelectasis and small pleural effusions. Electronically Signed   By: Marijo Sanes M.D.   On: 06/19/2015 09:26     Assessment and Plan  65 y.o. female   1. New onset Afib with RVR:  maintaining sinus rhythm  -Continue Lopressor 25 mg bid for rate control -On  heparin gtt for anticoagulation at this time.  we will need aspirin, Plavix for one month, then would drop aspirin, start xarelto with plavix  2. NSTEMI underlying coronary disease leading to this elevation in the setting of her RUL PNA and Afib with RVR -Continue heparin gtt at this time ---three vessel disease on cath yesterday, Needs cath on Monday for PCI (ffr of LAD, PCI of OM and RCA needed)  3. Sepsis in the setting of RUL PNA: -Patient meets at least 2/4 SIRS criteria (CBC not resulted), with source of infection known -Levaquin per IM Feeling better  4. Diabetes: -SSI per IM  5. Anemia: Received PRBC yesterday, HCT 29  6. Pleural effusion: Secondary to systolic CHF, improving  7) Acute on chronic renal failure: Will hold lasix as RVSP on echo is normal, in prep for cath on Monday  8) Cardiomyopathy, systolic dysfunction, ischemic EF 30% on echo, in NSR    Signed, Esmond Plants, MD [facility] 06/23/2015, 8:35 AM

## 2015-06-23 NOTE — Consult Note (Signed)
ANTICOAGULATION CONSULT NOTE -Follow-up  Pharmacy Consult for heparin Indication: atrial fibrillation  No Known Allergies  Patient Measurements: Height: 5\' 4"  (162.6 cm) Weight: 166 lb (75.297 kg) IBW/kg (Calculated) : 54.7 Heparin Dosing Weight: 69.3 kg  Vital Signs: Temp: 98.3 F (36.8 C) (01/13 0444) Temp Source: Oral (01/13 0444) BP: 158/79 mmHg (01/13 0444) Pulse Rate: 64 (01/13 0444)  Labs:  Recent Labs  06/21/15 [id number]  06/21/15 2214 06/22/15 0408 06/22/15 2233 06/23/15 0608  HGB 8.8*  --   --  7.9* 9.5* 9.7*  HCT 26.5*  --   --  23.9* 28.7* 29.1*  PLT 377  --   --  361  --  399  HEPARINUNFRC 0.25*  < > 0.53 0.61  --  0.62  CREATININE 1.78*  --   --  1.97*  --   --   < > = values in this interval not displayed.  Estimated Creatinine Clearance: 29 mL/min (by C-G formula based on Cr of 1.97).   Medical History: Past Medical History  Diagnosis Date  . CAD (coronary artery disease)     a. s/p MI/syncope in 2009 s/p remote stenting  . Diabetes mellitus without complication (HCC)     Medications:  Scheduled:   . acetylcysteine  600 mg Oral TID  . aspirin EC  81 mg Oral Daily  . atorvastatin  40 mg Oral q1800  . docusate sodium  100 mg Oral BID  . insulin aspart  0-5 Units Subcutaneous QHS  . insulin aspart  0-9 Units Subcutaneous TID WC  . insulin glargine  20 Units Subcutaneous QHS  . isosorbide mononitrate  30 mg Oral BID  . levofloxacin  750 mg Oral Q48H  . metoprolol tartrate  50 mg Oral BID  . sodium chloride  3 mL Intravenous Q12H   Infusions:  . sodium chloride 500 mL/hr at 06/22/15 0744  . heparin 1,200 Units/hr (06/22/15 1146)    Assessment: Theresa Rogers is a 65yo female with atrial fibrillation. Pharmacy consulted to dose heparin in this patient.  Goal of Therapy:  Heparin level 0.3-0.7 units/ml Monitor platelets by anticoagulation protocol: Yes   Plan:  Two consecutive heparin levels at goal so will continue heparin infusion at 900 units/hr  and f/u am labs.   1/11: Heparin level @0633 =0.25.  Will give 1000 units bolus x 1 and increase drip rate to 1000 units/hr. Will recheck in 6 hrs and f/u CBC in am  1/11: Heparin level @ 1329=0.11. Will give 2000 unit bolus and increase drip to 1200 units/hr. Will recheck in 6 hrs.  1/11 PM heparin level 0.53. Continue current regimen and recheck with AM labs tomorrow.  1/12 AM heparin level 0.61. Continue current regimen. Recheck with CBC tomorrow AM.  1/13 AM heparin level 0.62. Continue current regimen. Recheck with CBC tomorrow AM.   Sim Boast, PharmD, BCPS  06/23/2015

## 2015-06-24 LAB — GLUCOSE, CAPILLARY
GLUCOSE-CAPILLARY: 159 mg/dL — AB (ref 65–99)
Glucose-Capillary: 194 mg/dL — ABNORMAL HIGH (ref 65–99)
Glucose-Capillary: 223 mg/dL — ABNORMAL HIGH (ref 65–99)

## 2015-06-24 LAB — CBC
HCT: 27.4 % — ABNORMAL LOW (ref 35.0–47.0)
Hemoglobin: 9.2 g/dL — ABNORMAL LOW (ref 12.0–16.0)
MCH: 29 pg (ref 26.0–34.0)
MCHC: 33.5 g/dL (ref 32.0–36.0)
MCV: 86.5 fL (ref 80.0–100.0)
PLATELETS: 355 10*3/uL (ref 150–440)
RBC: 3.16 MIL/uL — AB (ref 3.80–5.20)
RDW: 13.3 % (ref 11.5–14.5)
WBC: 10.7 10*3/uL (ref 3.6–11.0)

## 2015-06-24 LAB — HEPARIN LEVEL (UNFRACTIONATED): HEPARIN UNFRACTIONATED: 0.6 [IU]/mL (ref 0.30–0.70)

## 2015-06-24 MED ORDER — GUAIFENESIN-DM 100-10 MG/5ML PO SYRP
10.0000 mL | ORAL_SOLUTION | Freq: Four times a day (QID) | ORAL | Status: DC | PRN
  Administered 2015-06-24: 10 mL via ORAL
  Filled 2015-06-24: qty 10

## 2015-06-24 MED ORDER — CLOPIDOGREL BISULFATE 75 MG PO TABS
75.0000 mg | ORAL_TABLET | Freq: Every day | ORAL | Status: DC
  Administered 2015-06-25 – 2015-06-27 (×3): 75 mg via ORAL
  Filled 2015-06-24 (×3): qty 1

## 2015-06-24 MED ORDER — CLOPIDOGREL BISULFATE 75 MG PO TABS
300.0000 mg | ORAL_TABLET | Freq: Once | ORAL | Status: AC
  Administered 2015-06-24: 300 mg via ORAL
  Filled 2015-06-24: qty 4

## 2015-06-24 NOTE — Progress Notes (Signed)
[place] at [place] NAME: Theresa Rogers    MR#:  [id number]  DATE OF BIRTH:  [birth year]  SUBJECTIVE:  CHIEF COMPLAINT:   Chief Complaint  Patient presents with  . Shortness of Breath    Have pneumonia, and A fib with elevated troponin,  Patient had cardiac catheterization done 01/12/2017with three-vessel disease. Remains on Heparin drip, renal func is little better. HR stable. Denies any chest pain or shortness of breath.. Repeat cardiac catheterization is scheduled for Monday No complaints today, no ON events  REVIEW OF SYSTEMS:  CONSTITUTIONAL: No fever, positive for fatigue or weakness.  EYES: No blurred or double vision.  EARS, NOSE, AND THROAT: No tinnitus or ear pain.  RESPIRATORY: positive for cough & shortness of breath,no wheezing or hemoptysis.  CARDIOVASCULAR: No chest pain,positive for orthopnea, edema.  GASTROINTESTINAL: No nausea, vomiting, diarrhea or abdominal pain.  GENITOURINARY: No dysuria, hematuria.  ENDOCRINE: No polyuria, nocturia,  HEMATOLOGY: No anemia, easy bruising or bleeding SKIN: No rash or lesion. MUSCULOSKELETAL: No joint pain or arthritis.   NEUROLOGIC: No tingling, numbness, positive for generalized weakness.  PSYCHIATRY: No anxiety or depression.   ROS  DRUG ALLERGIES:  No Known Allergies  VITALS:  Blood pressure 139/64, pulse 65, temperature 98.5 F (36.9 C), temperature source Oral, resp. rate 16, height 5\' 4"  (1.626 m), weight 76.204 kg (168 lb), SpO2 92 %.  PHYSICAL EXAMINATION:   GENERAL: 65 y.o.-year-old patient lying in the bed with no acute distress.  EYES: Pupils equal, round, reactive to light and accommodation. No scleral icterus. Extraocular muscles intact.  HEENT: Head atraumatic, normocephalic. Oropharynx and nasopharynx clear. No oropharyngeal erythema, moist oral mucosa  NECK: Supple, no jugular venous distention. No thyroid enlargement, no tenderness.  LUNGS:  Increased work of breathing. Right ronchi. CARDIOVASCULAR: S1, S2 normal. Irregular. Tachycardic ABDOMEN: Soft, nontender, nondistended. Bowel sounds present. No organomegaly or mass.  EXTREMITIES: No pedal edema, cyanosis, or clubbing. + 2 pedal & radial pulses b/l.  NEUROLOGIC: Cranial nerves II through XII are intact. No focal Motor or sensory deficits appreciated b/l PSYCHIATRIC: The patient is alert and oriented x 3. Good affect.  SKIN: No obvious rash, lesion, or ulcer.   Physical Exam LABORATORY PANEL:   CBC  Recent Labs Lab 06/24/15 0412  WBC 10.7  HGB 9.2*  HCT 27.4*  PLT 355   ------------------------------------------------------------------------------------------------------------------  Chemistries   Recent Labs Lab 06/19/15 0923  06/23/15 0608  NA 134*  < > 133*  K 3.8  < > 3.8  CL 103  < > 102  CO2 21*  < > 23  GLUCOSE 105*  < > 103*  BUN 46*  < > 35*  CREATININE 1.73*  < > 1.60*  CALCIUM 8.5*  < > 8.0*  AST 53*  --   --   ALT 60*  --   --   ALKPHOS 309*  --   --   BILITOT 0.4  --   --   < > = values in this interval not displayed. ------------------------------------------------------------------------------------------------------------------  Cardiac Enzymes  Recent Labs Lab 06/19/15 1601 06/19/15 2148  TROPONINI 2.29* 2.94*   ------------------------------------------------------------------------------------------------------------------  RADIOLOGY:  No results found.  ASSESSMENT AND PLAN:   Active Problems:   A-fib ([place])   Community acquired pneumonia   NSTEMI (non-ST elevated myocardial infarction) ([place])   Rapid atrial fibrillation (HCC)   Congestive dilated cardiomyopathy (HCC)   Coronary artery disease involving native coronary artery of native heart without angina  pectoris   Acute renal failure with tubular necrosis (HCC)   Pleural effusion   NSTEMI Patient had cardiac catheterization  06/21/1998 617 with three-vessel  disease, cardiology has scheduled for repeat cardiac catheterization on Monday. Planning to do stepwise stenting in view of worsening of renal function    On Betablocker, statin, ASA    Continue heparin drip Patient received 1 unit of blood transfusion 1/12 and hemoglobin is at 9.7  * Afib with RVR Likely due to pneumonia.    Rate controlled on oral metoprolol. Checked TSH mildly elevated. Free T3 slightly low, Free T4- normal. Repeat thyroid function tests in 6 weeks after discharge  Vital primary care physician  echocardiogram with 30-35% ef  appreciat consult cardiology. On heparin drip, aspirin, statin and beta blocker.   Will need to have Oral Anticoagulant xarelto on d/c. Cardiology is recommending to continue aspirin, Plavix for one month, then would d/c aspirin, start xarelto with plavix  * Essential hypertension with elevated blood pressures Imdur dose is increased to 30 mg by mouth twice a day and will consider to increase metoprolol to 50 mg by mouth twice a day If no clinical improvement     * RUL CAP on Levaquin. Wean oxygen as needed. Nebulizers as needed.   Much better now.  * ARF due to pneumonia. Has elevated BNP and some orthopnea and mild edema. No IV   fluids at this point.   Monitor, avoid nephrotoxic meds. Consult nephrology if renal function is getting worse    * IDDM-uncontrolled with hemoglobin A1c at 11.6 titrate  home dose of insulin along with sliding scale. Diabetic diet.  * DVT prophylaxis On heparin drip   All the records are reviewed and case discussed with Care Management/Social Workerr. Management plans discussed with the patient, family and they are in agreement.  CODE STATUS: FUll  TOTAL TIME TAKING CARE OF THIS PATIENT: 35 minutes.  Discussed with cardiologist.  POSSIBLE D/C IN 3-4  DAYS, DEPENDING ON CLINICAL CONDITION.   Nicholes Mango M.D on 06/24/2015   Between 7am to 6pm - Pager - [phone]  After 6pm go to [email or web address]  - password [email or web address] [facility]  Office  [phone]  CC: Primary care physician; Pcp Not In System  Note: This dictation was prepared with Dragon dictation along with smaller phrase technology. Any transcriptional errors that result from this process are unintentional.

## 2015-06-24 NOTE — Progress Notes (Signed)
Per Grayland Ormond, NT 0800 CBG per glucometer was 137.

## 2015-06-24 NOTE — Consult Note (Signed)
ANTICOAGULATION CONSULT NOTE -Follow-up  Pharmacy Consult for heparin Indication: atrial fibrillation  No Known Allergies  Patient Measurements: Height: 5\' 4"  (162.6 cm) Weight: 168 lb (76.204 kg) IBW/kg (Calculated) : 54.7 Heparin Dosing Weight: 69.3 kg  Vital Signs: Temp: 98.2 F (36.8 C) (01/14 0414) Temp Source: Oral (01/14 0414) BP: 170/75 mmHg (01/14 0414) Pulse Rate: 65 (01/14 0414)  Labs:  Recent Labs  06/21/15 [id number]  06/22/15 0408 06/22/15 2233 06/23/15 0608 06/24/15 0412  HGB 8.8*  --  7.9* 9.5* 9.7* 9.2*  HCT 26.5*  --  23.9* 28.7* 29.1* 27.4*  PLT 377  --  361  --  399 355  HEPARINUNFRC 0.25*  < > 0.61  --  0.62 0.60  CREATININE 1.78*  --  1.97*  --  1.60*  --   < > = values in this interval not displayed.  Estimated Creatinine Clearance: 36 mL/min (by C-G formula based on Cr of 1.6).   Medical History: Past Medical History  Diagnosis Date  . CAD (coronary artery disease)     a. s/p MI/syncope in 2009 s/p remote stenting  . Diabetes mellitus without complication (HCC)     Medications:  Scheduled:   . aspirin EC  81 mg Oral Daily  . atorvastatin  40 mg Oral q1800  . docusate sodium  100 mg Oral BID  . insulin aspart  0-5 Units Subcutaneous QHS  . insulin aspart  0-9 Units Subcutaneous TID WC  . insulin glargine  20 Units Subcutaneous QHS  . isosorbide mononitrate  30 mg Oral BID  . levofloxacin  750 mg Oral Q48H  . metoprolol tartrate  50 mg Oral BID  . sodium chloride  3 mL Intravenous Q12H   Infusions:  . sodium chloride 500 mL/hr at 06/22/15 0744  . heparin 1,200 Units/hr (06/22/15 1146)    Assessment: LM is a 65yo female with atrial fibrillation. Pharmacy consulted to dose heparin in this patient.  Goal of Therapy:  Heparin level 0.3-0.7 units/ml Monitor platelets by anticoagulation protocol: Yes   Plan:  Two consecutive heparin levels at goal so will continue heparin infusion at 900 units/hr and f/u am labs.   1/11: Heparin  level @0633 =0.25.  Will give 1000 units bolus x 1 and increase drip rate to 1000 units/hr. Will recheck in 6 hrs and f/u CBC in am  1/11: Heparin level @ 1329=0.11. Will give 2000 unit bolus and increase drip to 1200 units/hr. Will recheck in 6 hrs.  1/11 PM heparin level 0.53. Continue current regimen and recheck with AM labs tomorrow.  1/12 AM heparin level 0.61. Continue current regimen. Recheck with CBC tomorrow AM.  1/13 AM heparin level 0.62. Continue current regimen. Recheck with CBC tomorrow AM.  1/14 AM heparin level 0.60. Continue current regimen. Recheck with CBC tomorrow AM.   Sim Boast, PharmD, BCPS  06/24/2015

## 2015-06-24 NOTE — Progress Notes (Signed)
No bleeding/hematoma/bruising noted on the right groin. Denied any pain, no acute respiratory distress noted. The  Heparin is  still running at 12 mL/hour. Staff will continue to monitor.

## 2015-06-24 NOTE — Progress Notes (Signed)
    Subjective:  Doing ok this afternoon. No chest pain. Main c/o is nonproductive cough.   Objective:  Vital Signs in the last 24 hours: Temp:  [98.2 F (36.8 C)-98.4 F (36.9 C)] 98.2 F (36.8 C) (01/14 0414) Pulse Rate:  [60-72] 60 (01/14 0945) Resp:  [16] 16 (01/14 0414) BP: (158-180)/(73-81) 158/73 mmHg (01/14 0945) SpO2:  [92 %] 92 % (01/14 0414) Weight:  [168 lb (76.204 kg)] 168 lb (76.204 kg) (01/14 0414)  Intake/Output from previous day: 01/13 0701 - 01/14 0700 In: 864 [P.O.:720; I.V.:144] Out: 450 [Urine:450]  Physical Exam: Pt is alert and oriented, NAD HEENT: normal Neck: JVP - normal Lungs: CTA bilaterally CV: RRR without murmur or gallop Abd: soft, NT, Positive BS, no hepatomegaly Ext: no C/C/E Skin: warm/dry no rash   Lab Results:  Recent Labs  06/23/15 0608 06/24/15 0412  WBC 10.1 10.7  HGB 9.7* 9.2*  PLT 399 355    Recent Labs  06/22/15 0408 06/23/15 0608  NA 135 133*  K 4.4 3.8  CL 106 102  CO2 25 23  GLUCOSE 162* 103*  BUN 51* 35*  CREATININE 1.97* 1.60*   No results for input(s): TROPONINI in the last 72 hours.  Invalid input(s): CK, MB  Cardiac Studies: Cardiac cath reviewed  Tele: Sinus rhythm - personally reviewed  Assessment/Plan:  1. Acute on chronic systolic heart failure, LVEF 30-35% by echo 2. NSTEMI - likely major component of demand ischemia in setting pneumonia and heart failure 3. 3 vessel CAD with plans for FFR-directed PCI Monday. Diffuse distal vessel disease with poor targets for CABG 4. AKI on CKD III: creatinine stable, will repeat in am. Minimize nephrotoxic agents as repeat contrast administration planned 1/16. No ACE/ARB. 5. Diabetes - per primary team  Dispo: repeat BMET tomorrow, load with plavix, PCI Monday (orders written).  Sherren Mocha, M.D. 06/24/2015, 12:07 PM

## 2015-06-24 NOTE — Progress Notes (Signed)
Patient c/o non-productive cough to the RN. Dr. Anselm Jungling notified with a new order for Robitussin 10 mL every 6 hours as needed for cough.

## 2015-06-25 LAB — GLUCOSE, CAPILLARY
Glucose-Capillary: 115 mg/dL — ABNORMAL HIGH (ref 65–99)
Glucose-Capillary: 177 mg/dL — ABNORMAL HIGH (ref 65–99)
Glucose-Capillary: 171 mg/dL — ABNORMAL HIGH (ref 65–99)
Glucose-Capillary: 237 mg/dL — ABNORMAL HIGH (ref 65–99)

## 2015-06-25 LAB — BASIC METABOLIC PANEL WITH GFR
Anion gap: 6 (ref 5–15)
BUN: 31 mg/dL — ABNORMAL HIGH (ref 6–20)
CO2: 23 mmol/L (ref 22–32)
Calcium: 8.4 mg/dL — ABNORMAL LOW (ref 8.9–10.3)
Chloride: 105 mmol/L (ref 101–111)
Creatinine, Ser: 1.39 mg/dL — ABNORMAL HIGH (ref 0.44–1.00)
GFR calc Af Amer: 46 mL/min — ABNORMAL LOW
GFR calc non Af Amer: 39 mL/min — ABNORMAL LOW
Glucose, Bld: 152 mg/dL — ABNORMAL HIGH (ref 65–99)
Potassium: 4 mmol/L (ref 3.5–5.1)
Sodium: 134 mmol/L — ABNORMAL LOW (ref 135–145)

## 2015-06-25 LAB — CBC
HCT: 26.6 % — ABNORMAL LOW (ref 35.0–47.0)
Hemoglobin: 8.9 g/dL — ABNORMAL LOW (ref 12.0–16.0)
MCH: 28.5 pg (ref 26.0–34.0)
MCHC: 33.4 g/dL (ref 32.0–36.0)
MCV: 85.2 fL (ref 80.0–100.0)
PLATELETS: 353 10*3/uL (ref 150–440)
RBC: 3.12 MIL/uL — AB (ref 3.80–5.20)
RDW: 13.1 % (ref 11.5–14.5)
WBC: 10 10*3/uL (ref 3.6–11.0)

## 2015-06-25 LAB — HEPARIN LEVEL (UNFRACTIONATED): HEPARIN UNFRACTIONATED: 0.53 [IU]/mL (ref 0.30–0.70)

## 2015-06-25 MED ORDER — SODIUM CHLORIDE 0.9 % IV SOLN
INTRAVENOUS | Status: DC
  Administered 2015-06-26: 05:00:00 via INTRAVENOUS

## 2015-06-25 MED ORDER — SODIUM CHLORIDE 0.9 % IJ SOLN
3.0000 mL | Freq: Two times a day (BID) | INTRAMUSCULAR | Status: DC
  Administered 2015-06-26: 3 mL via INTRAVENOUS

## 2015-06-25 MED ORDER — METOPROLOL TARTRATE 25 MG PO TABS
25.0000 mg | ORAL_TABLET | Freq: Once | ORAL | Status: AC
  Administered 2015-06-25: 25 mg via ORAL

## 2015-06-25 MED ORDER — SODIUM CHLORIDE 0.9 % IV SOLN: 250.0000 mL | INTRAVENOUS | Status: DC | PRN

## 2015-06-25 MED ORDER — SODIUM CHLORIDE 0.9 % IJ SOLN: 3.0000 mL | INTRAMUSCULAR | Status: DC | PRN

## 2015-06-25 MED ORDER — AMLODIPINE BESYLATE 5 MG PO TABS
5.0000 mg | ORAL_TABLET | Freq: Every day | ORAL | Status: DC
  Administered 2015-06-25 – 2015-06-27 (×3): 5 mg via ORAL
  Filled 2015-06-25 (×2): qty 1

## 2015-06-25 MED ORDER — METOPROLOL TARTRATE 50 MG PO TABS
75.0000 mg | ORAL_TABLET | Freq: Two times a day (BID) | ORAL | Status: DC
  Administered 2015-06-25 – 2015-06-27 (×4): 75 mg via ORAL
  Filled 2015-06-25 (×4): qty 1

## 2015-06-25 NOTE — Progress Notes (Signed)
    Subjective:  No CP or dyspnea. Continues to have dry cough.  Objective:  Vital Signs in the last 24 hours: Temp:  [98 F (36.7 C)-98.4 F (36.9 C)] 98.4 F (36.9 C) (01/15 1059) Pulse Rate:  [62-73] 66 (01/15 1129) Resp:  [16-20] 20 (01/15 1059) BP: (151-185)/(66-91) 185/80 mmHg (01/15 1129) SpO2:  [91 %-95 %] 91 % (01/15 1059) Weight:  [166 lb 14.4 oz (75.705 kg)] 166 lb 14.4 oz (75.705 kg) (01/15 0516)  Intake/Output from previous day: 01/14 0701 - 01/15 0700 In: 720 [P.O.:720] Out: 200 [Urine:200]  Physical Exam: Pt is alert and oriented, NAD HEENT: normal Neck: JVP - normal Lungs: CTA bilaterally CV: RRR without murmur or gallop Abd: soft, NT, Positive BS, no hepatomegaly Ext: no C/C/E, distal pulses intact and equal Skin: warm/dry no rash   Lab Results:  Recent Labs  06/24/15 0412 06/25/15 0422  WBC 10.7 10.0  HGB 9.2* 8.9*  PLT 355 353    Recent Labs  06/23/15 0608 06/25/15 0422  NA 133* 134*  K 3.8 4.0  CL 102 105  CO2 23 23  GLUCOSE 103* 152*  BUN 35* 31*  CREATININE 1.60* 1.39*   No results for input(s): TROPONINI in the last 72 hours.  Invalid input(s): CK, MB  Tele: Personally reviewed: sinus rhythm  Assessment/Plan:  1. Acute on chronic systolic heart failure, LVEF 30-35% by echo. Remains euvolemic 2. NSTEMI - likely major component of demand ischemia in setting pneumonia and heart failure. PCI tomorrow 3. 3 vessel CAD with plans for FFR-directed PCI Monday. Diffuse distal vessel disease with poor targets for CABG. Loaded with plavix yesterday. 4. AKI on CKD III: creatinine improved today. Minimize nephrotoxic agents as repeat contrast administration planned 1/16. No ACE/ARB. 5. Diabetes - per primary team 6. Paroxysmal Atrial Fibrillation - plans noted per Dr Rockey Situ for antiplatelet Rx x 1 month with ASA and plavix, then likely DOAC and plavix. Maintaining sinus rhythm on monitor - may have been driven by pneumonia.  Dispo: PCI  tomorrow, likely hospital discharge Tuesday   Sherren Mocha, M.D. 06/25/2015, 12:54 PM

## 2015-06-25 NOTE — Progress Notes (Signed)
[facility] at [place] NAME: Theresa Rogers    MR#:  [id number]  DATE OF BIRTH:  03-Feb-1951  SUBJECTIVE:  CHIEF COMPLAINT:   Chief Complaint  Patient presents with  . Shortness of Breath    Have pneumonia, and A fib with elevated troponin,  Patient had cardiac catheterization done 01/12/2017with three-vessel disease. Remains on Heparin drip, renal func is  better. HR stable. Denies any chest pain or shortness of breath.. Repeat cardiac catheterization is scheduled for Monday  Patient had few episodes of cough last night but shortness of breath is better. Feeling better overall  REVIEW OF SYSTEMS:  CONSTITUTIONAL: No fever, positive for fatigue or weakness.  EYES: No blurred or double vision.  EARS, NOSE, AND THROAT: No tinnitus or ear pain.  RESPIRATORY: positive for cough & shortness of breath,no wheezing or hemoptysis.  CARDIOVASCULAR: No chest pain,positive for orthopnea, edema.  GASTROINTESTINAL: No nausea, vomiting, diarrhea or abdominal pain.  GENITOURINARY: No dysuria, hematuria.  ENDOCRINE: No polyuria, nocturia,  HEMATOLOGY: No anemia, easy bruising or bleeding SKIN: No rash or lesion. MUSCULOSKELETAL: No joint pain or arthritis.   NEUROLOGIC: No tingling, numbness, positive for generalized weakness.  PSYCHIATRY: No anxiety or depression.   ROS  DRUG ALLERGIES:  No Known Allergies  VITALS:  Blood pressure 185/80, pulse 66, temperature 98.4 F (36.9 C), temperature source Oral, resp. rate 20, height 5\' 4"  (1.626 m), weight 75.705 kg (166 lb 14.4 oz), SpO2 91 %.  PHYSICAL EXAMINATION:   GENERAL: 65 y.o.-year-old patient lying in the bed with no acute distress.  EYES: Pupils equal, round, reactive to light and accommodation. No scleral icterus. Extraocular muscles intact.  HEENT: Head atraumatic, normocephalic. Oropharynx and nasopharynx clear. No oropharyngeal erythema, moist oral mucosa  NECK: Supple, no  jugular venous distention. No thyroid enlargement, no tenderness.  LUNGS: Increased work of breathing. Right ronchi. CARDIOVASCULAR: S1, S2 normal. Irregular. Tachycardic ABDOMEN: Soft, nontender, nondistended. Bowel sounds present. No organomegaly or mass.  EXTREMITIES: No pedal edema, cyanosis, or clubbing. + 2 pedal & radial pulses b/l.  NEUROLOGIC: Cranial nerves II through XII are intact. No focal Motor or sensory deficits appreciated b/l PSYCHIATRIC: The patient is alert and oriented x 3. Good affect.  SKIN: No obvious rash, lesion, or ulcer.   Physical Exam LABORATORY PANEL:   CBC  Recent Labs Lab 06/25/15 0422  WBC 10.0  HGB 8.9*  HCT 26.6*  PLT 353   ------------------------------------------------------------------------------------------------------------------  Chemistries   Recent Labs Lab 06/19/15 0923  06/25/15 0422  NA 134*  < > 134*  K 3.8  < > 4.0  CL 103  < > 105  CO2 21*  < > 23  GLUCOSE 105*  < > 152*  BUN 46*  < > 31*  CREATININE 1.73*  < > 1.39*  CALCIUM 8.5*  < > 8.4*  AST 53*  --   --   ALT 60*  --   --   ALKPHOS 309*  --   --   BILITOT 0.4  --   --   < > = values in this interval not displayed. ------------------------------------------------------------------------------------------------------------------  Cardiac Enzymes  Recent Labs Lab 06/19/15 1601 06/19/15 2148  TROPONINI 2.29* 2.94*   ------------------------------------------------------------------------------------------------------------------  RADIOLOGY:  No results found.  ASSESSMENT AND PLAN:   Active Problems:   A-fib ([place])   Community acquired pneumonia   NSTEMI (non-ST elevated myocardial infarction) ([place])   Rapid atrial fibrillation (HCC)   Congestive dilated cardiomyopathy ([place])  Coronary artery disease involving native coronary artery of native heart without angina pectoris   Acute renal failure with tubular necrosis (HCC)   Pleural effusion    NSTEMI Patient had cardiac catheterization  06/21/1998 617 with three-vessel disease, cardiology has scheduled for repeat cardiac catheterization on Monday. Planning to do stenting tomorrow    On Betablocker, statin, ASA    Continue heparin drip Patient received 1 unit of blood transfusion 1/12 and hemoglobin is at 9.7--8.9(probably from hemodilution and blood work) Will continue monitoring Plavix load was given, patient will be nothing by mouth after midnight Appreciate cardiology recommendations  * Afib with RVR 2/2 due to pneumonia.    Rate controlled on oral metoprolol. Checked TSH mildly elevated. Free T3 slightly low, Free T4- normal. Repeat thyroid function tests in 6 weeks after discharge  Vital primary care physician  echocardiogram with 30-35% ef  appreciat consult cardiology. On heparin drip, aspirin, statin and beta blocker.   Will need to have Oral Anticoagulant xarelto on d/c. Cardiology is recommending to continue aspirin, Plavix for one month, then would d/c aspirin, start xarelto with plavix  * Essential hypertension with elevated blood pressures Imdur dose is increased to 30 mg by mouth twice a day  -increased metoprolol to 75mg  by mouth twice a day today for elevated blood pressure      * RUL CAP-clinically improving on Levaquin. Wean oxygen as needed. Nebulizers as needed.     * ARF creatinine is improving-at 1.90- 1.39 due to pneumonia. Has elevated BNP and some orthopnea and mild edema. No IV   fluids at this point.   Monitor, avoid nephrotoxic meds. Consult nephrology if renal function is getting worse    * IDDM-uncontrolled with hemoglobin A1c at 11.6 titrate  home dose of insulin along with sliding scale. Diabetic diet. Accu-Cheks are okay at this time  * DVT prophylaxis On heparin drip   All the records are reviewed and case discussed with Care Management/Social Workerr. Management plans discussed with the patient, family and they are in  agreement.  CODE STATUS: FUll  TOTAL TIME TAKING CARE OF THIS PATIENT: 35 minutes.  Discussed with [facility] cardiologist, Dr. Burt Knack  POSSIBLE D/C IN 3-4  DAYS, DEPENDING ON CLINICAL CONDITION.   Nicholes Mango M.D on 06/25/2015   Between 7am to 6pm - Pager - [phone]  After 6pm go to [email or web address] - password [email or web address] [facility]  Office  [phone]  CC: Primary care physician; Pcp Not In System  Note: This dictation was prepared with Dragon dictation along with smaller phrase technology. Any transcriptional errors that result from this process are unintentional.

## 2015-06-25 NOTE — Progress Notes (Signed)
Dr. Margaretmary Eddy aware of vitals  Filed Vitals:   06/25/15 1059 06/25/15 1129  BP: 177/79 185/80  Pulse: 66 66  Temp: 98.4 F (36.9 C)   Resp: 20    Per MD okay to give metoprolol 25mg  PO now. Will continue to monitor patient. Horton Finer

## 2015-06-25 NOTE — Consult Note (Signed)
ANTICOAGULATION CONSULT NOTE -Follow-up  Pharmacy Consult for heparin Indication: atrial fibrillation  No Known Allergies  Patient Measurements: Height: 5\' 4"  (162.6 cm) Weight: 166 lb 14.4 oz (75.705 kg) IBW/kg (Calculated) : 54.7 Heparin Dosing Weight: 69.3 kg  Vital Signs: Temp: 98.2 F (36.8 C) (01/15 0516) Temp Source: Oral (01/15 0516) BP: 166/91 mmHg (01/15 0516) Pulse Rate: 64 (01/15 0516)  Labs:  Recent Labs  06/23/15 0608 06/24/15 0412 06/25/15 0422  HGB 9.7* 9.2* 8.9*  HCT 29.1* 27.4* 26.6*  PLT 399 355 353  HEPARINUNFRC 0.62 0.60 0.53  CREATININE 1.60*  --  1.39*    Estimated Creatinine Clearance: 41.3 mL/min (by C-G formula based on Cr of 1.39).   Medical History: Past Medical History  Diagnosis Date  . CAD (coronary artery disease)     a. s/p MI/syncope in 2009 s/p remote stenting  . Diabetes mellitus without complication (HCC)     Medications:  Scheduled:   . aspirin EC  81 mg Oral Daily  . atorvastatin  40 mg Oral q1800  . clopidogrel  75 mg Oral Daily  . docusate sodium  100 mg Oral BID  . insulin aspart  0-5 Units Subcutaneous QHS  . insulin aspart  0-9 Units Subcutaneous TID WC  . insulin glargine  20 Units Subcutaneous QHS  . isosorbide mononitrate  30 mg Oral BID  . levofloxacin  750 mg Oral Q48H  . metoprolol tartrate  50 mg Oral BID  . sodium chloride  3 mL Intravenous Q12H   Infusions:  . sodium chloride 500 mL/hr at 06/22/15 0744  . heparin 1,200 Units/hr (06/25/15 0528)    Assessment: Theresa Rogers is a 65yo female with atrial fibrillation. Pharmacy consulted to dose heparin in this patient.  Goal of Therapy:  Heparin level 0.3-0.7 units/ml Monitor platelets by anticoagulation protocol: Yes   Plan:  Two consecutive heparin levels at goal so will continue heparin infusion at 900 units/hr and f/u am labs.   1/11: Heparin level @0633 =0.25.  Will give 1000 units bolus x 1 and increase drip rate to 1000 units/hr. Will recheck in 6  hrs and f/u CBC in am  1/11: Heparin level @ 1329=0.11. Will give 2000 unit bolus and increase drip to 1200 units/hr. Will recheck in 6 hrs.  1/11 PM heparin level 0.53. Continue current regimen and recheck with AM labs tomorrow.  1/12 AM heparin level 0.61. Continue current regimen. Recheck with CBC tomorrow AM.  1/13 AM heparin level 0.62. Continue current regimen. Recheck with CBC tomorrow AM.  1/14 AM heparin level 0.60. Continue current regimen. Recheck with CBC tomorrow AM.  1/15 AM heparin level 0.53. Continue current regimen. Recheck with CBC tomorrow AM.   Sim Boast, PharmD, BCPS  06/25/2015

## 2015-06-25 NOTE — Plan of Care (Signed)
Problem: Tissue Perfusion: Goal: Risk factors for ineffective tissue perfusion will decrease Outcome: Progressing Patient on heparin

## 2015-06-25 NOTE — Progress Notes (Signed)
Notified Dr. Margaretmary Eddy of patients BP after metoprolol 25mg .  Filed Vitals:   06/25/15 1300 06/25/15 1512  BP: 166/72 176/76  Pulse: 62 61  Temp:    Resp:     Per MD okay to order norvasc 5 mg daily. Will administer now and continue to monitor. Horton Finer.

## 2015-06-26 ENCOUNTER — Encounter: Payer: Self-pay | Admitting: Cardiovascular Disease

## 2015-06-26 ENCOUNTER — Encounter
Admission: EM | Disposition: A | Discharge status: Home / Self Care | Payer: Self-pay | Source: Home / Self Care | Attending: Internal Medicine

## 2015-06-26 HISTORY — PX: CARDIAC CATHETERIZATION: SHX172

## 2015-06-26 LAB — CBC
HCT: 25.5 % — ABNORMAL LOW (ref 35.0–47.0)
HEMOGLOBIN: 8.6 g/dL — AB (ref 12.0–16.0)
MCH: 28.5 pg (ref 26.0–34.0)
MCHC: 33.6 g/dL (ref 32.0–36.0)
MCV: 84.8 fL (ref 80.0–100.0)
PLATELETS: 325 10*3/uL (ref 150–440)
RBC: 3 MIL/uL — AB (ref 3.80–5.20)
RDW: 13.2 % (ref 11.5–14.5)
WBC: 8.8 10*3/uL (ref 3.6–11.0)

## 2015-06-26 LAB — GLUCOSE, CAPILLARY
GLUCOSE-CAPILLARY: 135 mg/dL — AB (ref 65–99)
GLUCOSE-CAPILLARY: 252 mg/dL — AB (ref 65–99)
Glucose-Capillary: 70 mg/dL (ref 65–99)

## 2015-06-26 LAB — HEPARIN LEVEL (UNFRACTIONATED): Heparin Unfractionated: 0.61 IU/mL (ref 0.30–0.70)

## 2015-06-26 SURGERY — CORONARY STENT INTERVENTION
Anesthesia: Moderate Sedation | Wound class: Clean

## 2015-06-26 MED ORDER — ADENOSINE (DIAGNOSTIC) 3 MG/ML IV SOLN
INTRAVENOUS | Status: AC
  Filled 2015-06-26: qty 30

## 2015-06-26 MED ORDER — SODIUM CHLORIDE 0.9 % IV SOLN: INTRAVENOUS | Status: AC

## 2015-06-26 MED ORDER — SODIUM CHLORIDE 0.9 % IV SOLN: 250.0000 mL | INTRAVENOUS | Status: DC | PRN

## 2015-06-26 MED ORDER — ASPIRIN 81 MG PO CHEW
81.0000 mg | CHEWABLE_TABLET | Freq: Once | ORAL | Status: AC
  Administered 2015-06-26: 81 mg via ORAL
  Filled 2015-06-26: qty 1

## 2015-06-26 MED ORDER — NITROGLYCERIN 1 MG/10 ML FOR IR/CATH LAB
INTRA_ARTERIAL | Status: DC | PRN
  Administered 2015-06-26: 200 ug via INTRACORONARY

## 2015-06-26 MED ORDER — BIVALIRUDIN 250 MG IV SOLR
INTRAVENOUS | Status: AC
  Filled 2015-06-26: qty 250

## 2015-06-26 MED ORDER — IOHEXOL 300 MG/ML  SOLN
INTRAMUSCULAR | Status: DC | PRN
  Administered 2015-06-26: 75 mL via INTRA_ARTERIAL

## 2015-06-26 MED ORDER — HEPARIN (PORCINE) IN NACL 2-0.9 UNIT/ML-% IJ SOLN
INTRAMUSCULAR | Status: AC
  Filled 2015-06-26: qty 1500

## 2015-06-26 MED ORDER — FENTANYL CITRATE (PF) 100 MCG/2ML IJ SOLN
INTRAMUSCULAR | Status: AC
  Filled 2015-06-26: qty 2

## 2015-06-26 MED ORDER — MIDAZOLAM HCL 2 MG/2ML IJ SOLN
INTRAMUSCULAR | Status: DC | PRN
  Administered 2015-06-26: 1 mg via INTRAVENOUS

## 2015-06-26 MED ORDER — MIDAZOLAM HCL 2 MG/2ML IJ SOLN
INTRAMUSCULAR | Status: AC
  Filled 2015-06-26: qty 2

## 2015-06-26 MED ORDER — ASPIRIN 81 MG PO CHEW: 81.0000 mg | CHEWABLE_TABLET | ORAL | Status: DC

## 2015-06-26 MED ORDER — BIVALIRUDIN BOLUS VIA INFUSION - CUPID
INTRAVENOUS | Status: DC | PRN
  Administered 2015-06-26: 57 mg via INTRAVENOUS

## 2015-06-26 MED ORDER — SODIUM CHLORIDE 0.9 % IJ SOLN
3.0000 mL | Freq: Two times a day (BID) | INTRAMUSCULAR | Status: DC
  Administered 2015-06-26 – 2015-06-27 (×3): 3 mL via INTRAVENOUS

## 2015-06-26 MED ORDER — FENTANYL CITRATE (PF) 100 MCG/2ML IJ SOLN
INTRAMUSCULAR | Status: DC | PRN
  Administered 2015-06-26: 25 ug via INTRAVENOUS

## 2015-06-26 MED ORDER — ADENOSINE (DIAGNOSTIC) 140MCG/KG/MIN
INTRAVENOUS | Status: DC | PRN
  Administered 2015-06-26: 417.763 ug/kg/min via INTRAVENOUS

## 2015-06-26 MED ORDER — BIVALIRUDIN 250 MG IV SOLR
250.0000 mg | INTRAVENOUS | Status: DC | PRN
  Administered 2015-06-26: 1.75 mg/kg/h via INTRAVENOUS

## 2015-06-26 MED ORDER — HEPARIN SODIUM (PORCINE) 1000 UNIT/ML IJ SOLN
INTRAMUSCULAR | Status: AC
  Filled 2015-06-26: qty 1

## 2015-06-26 MED ORDER — HYDRALAZINE HCL 25 MG PO TABS
25.0000 mg | ORAL_TABLET | Freq: Three times a day (TID) | ORAL | Status: DC
  Administered 2015-06-26 – 2015-06-27 (×3): 25 mg via ORAL
  Filled 2015-06-26 (×4): qty 1

## 2015-06-26 MED ORDER — SODIUM CHLORIDE 0.9 % IV SOLN: INTRAVENOUS | Status: DC

## 2015-06-26 MED ORDER — NITROGLYCERIN 5 MG/ML IV SOLN
INTRAVENOUS | Status: AC
  Filled 2015-06-26: qty 10

## 2015-06-26 MED ORDER — VERAPAMIL HCL 2.5 MG/ML IV SOLN
INTRAVENOUS | Status: DC | PRN
  Administered 2015-06-26: 2.5 mg via INTRA_ARTERIAL

## 2015-06-26 MED ORDER — SODIUM CHLORIDE 0.9 % IJ SOLN: 3.0000 mL | INTRAMUSCULAR | Status: DC | PRN

## 2015-06-26 MED ORDER — VERAPAMIL HCL 2.5 MG/ML IV SOLN
INTRAVENOUS | Status: AC
  Filled 2015-06-26: qty 2

## 2015-06-26 SURGICAL SUPPLY — 11 items
CATH VISTA GUIDE 6FR XBLAD3.5 (CATHETERS) ×3 IMPLANT
DEVICE INFLAT 30 PLUS (MISCELLANEOUS) ×3 IMPLANT
DEVICE RAD TR BAND REGULAR (VASCULAR PRODUCTS) ×3 IMPLANT
GLIDESHEATH SLEND SS 6F .021 (SHEATH) ×3 IMPLANT
KIT MANI 3VAL PERCEP (MISCELLANEOUS) ×3 IMPLANT
PACK CARDIAC CATH (CUSTOM PROCEDURE TRAY) ×3 IMPLANT
STENT RESOLUTE INTEG 3.0X12 (Permanent Stent) ×3 IMPLANT
VALVE COPILOT STAT (MISCELLANEOUS) ×3 IMPLANT
WIRE PRESSURE VERRATA (WIRE) ×3 IMPLANT
WIRE RUNTHROUGH .014X180CM (WIRE) ×3 IMPLANT
WIRE SAFE-T 1.5MM-J .035X260CM (WIRE) ×3 IMPLANT

## 2015-06-26 NOTE — Progress Notes (Signed)
[place] [facility] at [place] NAME: Theresa Rogers    MR#:  [id number]  DATE OF BIRTH:  [birth year]  SUBJECTIVE:  CHIEF COMPLAINT:   Chief Complaint  Patient presents with  . Shortness of Breath    Have pneumonia, and A fib with elevated troponin,  Patient had cardiac catheterization done 01/12/2017with three-vessel disease. Repeat cardiac catheterization is done today and had stent in the left circumflex patient tolerated procedure well. Seen the patient in cardiac catheter lab and also after cardiac catheterization. Denies any chest pain or shortness of breath and resting comfortably during my examination. Heparin drip is discontinued.    REVIEW OF SYSTEMS:  CONSTITUTIONAL: No fever, positive for fatigue or weakness.  EYES: No blurred or double vision.  EARS, NOSE, AND THROAT: No tinnitus or ear pain.  RESPIRATORY: positive for cough & shortness of breath,no wheezing or hemoptysis.  CARDIOVASCULAR: No chest pain,positive for orthopnea, edema.  GASTROINTESTINAL: No nausea, vomiting, diarrhea or abdominal pain.  GENITOURINARY: No dysuria, hematuria.  ENDOCRINE: No polyuria, nocturia,  HEMATOLOGY: No anemia, easy bruising or bleeding SKIN: No rash or lesion. MUSCULOSKELETAL: No joint pain or arthritis.   NEUROLOGIC: No tingling, numbness, positive for generalized weakness.  PSYCHIATRY: No anxiety or depression.   ROS  DRUG ALLERGIES:  No Known Allergies  VITALS:  Blood pressure 122/58, pulse 65, temperature 98 F (36.7 C), temperature source Oral, resp. rate 18, height 5\' 4"  (1.626 m), weight 75.978 kg (167 lb 8 oz), SpO2 98 %.  PHYSICAL EXAMINATION:   GENERAL: 65 y.o.-year-old patient lying in the bed with no acute distress.  EYES: Pupils equal, round, reactive to light and accommodation. No scleral icterus. Extraocular muscles intact.  HEENT: Head atraumatic, normocephalic. Oropharynx and nasopharynx clear. No oropharyngeal  erythema, moist oral mucosa  NECK: Supple, no jugular venous distention. No thyroid enlargement, no tenderness.  LUNGS: Increased work of breathing. Right ronchi. CARDIOVASCULAR: S1, S2 normal. Irregular. Tachycardic ABDOMEN: Soft, nontender, nondistended. Bowel sounds present. No organomegaly or mass.  EXTREMITIES: No pedal edema, cyanosis, or clubbing. + 2 pedal & radial pulses b/l.  Right wrist , cardiac catheter site is with clear dressing NEUROLOGIC: Cranial nerves II through XII are intact. No focal Motor or sensory deficits appreciated b/l PSYCHIATRIC: The patient is alert and oriented x 3. Good affect.  SKIN: No obvious rash, lesion, or ulcer.   Physical Exam LABORATORY PANEL:   CBC  Recent Labs Lab 06/26/15 0414  WBC 8.8  HGB 8.6*  HCT 25.5*  PLT 325   ------------------------------------------------------------------------------------------------------------------  Chemistries   Recent Labs Lab 06/25/15 0422  NA 134*  K 4.0  CL 105  CO2 23  GLUCOSE 152*  BUN 31*  CREATININE 1.39*  CALCIUM 8.4*   ------------------------------------------------------------------------------------------------------------------  Cardiac Enzymes  Recent Labs Lab 06/19/15 1601 06/19/15 2148  TROPONINI 2.29* 2.94*   ------------------------------------------------------------------------------------------------------------------  RADIOLOGY:  No results found.  ASSESSMENT AND PLAN:   Active Problems:   A-fib ([place])   Community acquired pneumonia   NSTEMI (non-ST elevated myocardial infarction) ([place])   Rapid atrial fibrillation (HCC)   Congestive dilated cardiomyopathy ([place])   Coronary artery disease involving native coronary artery of native heart without angina pectoris   Acute renal failure with tubular necrosis (HCC)   Pleural effusion   NSTEMI Patient had cardiac catheterization  06/21/1998 617 with three-vessel disease Patient had repeat cardiac  catheterization and stent placement in left circumflex      On Betablocker, statin, ASA  Discontinued  heparin dripprior to cardiac catheterization  Patient received 1 unit of blood transfusion 1/12 and hemoglobin is at 9.7--8.9--8.6 (probably from hemodilution  on heparin drip ,and blood work) Will continue monitoring Appreciate cardiology recommendations Overnight observation and aunt is getting discharged in a.m. with aspirin and Plavix   * Afib with RVR 2/2 due to pneumonia.    Rate controlled on oral metoprolol. Checked TSH mildly elevated. Free T3 slightly low, Free T4- normal. Repeat thyroid function tests in 6 weeks after discharge  Vital primary care physician  echocardiogram with 30-35% ef  appreciat consult cardiology. On heparin drip, aspirin, statin and beta blocker.  C and is is as ardiology is recommending to continue aspirin, Plavix for one month, then would d/c aspirin, start xarelto with plavix  * Essential hypertension with elevated blood pressures Imdur dose is increased to 30 mg by mouth twice a day  -increased metoprolol to 75mg  by mouth twice a day today for elevated blood pressure      * RUL CAP-clinically improving on Levaquin. Wean oxygen as needed. Nebulizers as needed.     * ARF creatinine is improving-at 1.90- 1.39 and check BMP in a.m. due to pneumonia. Has elevated BNP and some orthopnea and mild edema. No IV   fluids at this point.   Monitor, avoid nephrotoxic meds. Consult nephrology if renal function is getting worse    * IDDM-uncontrolled with hemoglobin A1c at 11.6 titrate  home dose of insulin along with sliding scale. Diabetic diet. Accu-Cheks are okay at this time  * DVT prophylaxis On heparin drip   All the records are reviewed and case discussed with Care Management/Social Workerr. Management plans discussed with the patient, family and they are in agreement.  CODE STATUS: FUll  TOTAL TIME TAKING CARE OF THIS PATIENT: 35  minutes.  Discussed with [facility] cardiologist, Dr. Fletcher Anon  POSSIBLE D/C IN 3-4  DAYS, DEPENDING ON CLINICAL CONDITION.   Nicholes Mango M.D on 06/26/2015   Between 7am to 6pm - Pager - [phone]  After 6pm go to [email or web address] - password [email or web address] [facility]  Office  [phone]  CC: Primary care physician; Pcp Not In System  Note: This dictation was prepared with Dragon dictation along with smaller phrase technology. Any transcriptional errors that result from this process are unintentional.

## 2015-06-26 NOTE — Progress Notes (Signed)
Patient: Theresa Rogers / Admit Date: 06/19/2015 / Date of Encounter: 06/26/2015, 11:26 AM   Subjective: She is status post staged cardiac cath this morning. She underwent successful PCI to the mid LCx. FFR of the mid LAD showed a ratio of 0.88, which was not clinically significant, thus no PCI was performed at this time. Her diagnostic cath on 06/22/2015 showed a prox LAD 70%, dist LAD 90%, mid LCx 80%, s/p PCI/DES 0%, prox RCA 70%, mid RCA 80%, dist RCA 80%. She is currently without chest pain or SOB. BP slightly high in the [id number] systolic.   Review of Systems: Review of Systems  Constitutional: Positive for weight loss and malaise/fatigue. Negative for fever, chills and diaphoresis.  HENT: Negative for congestion.   Eyes: Negative for discharge and redness.  Respiratory: Negative for cough, hemoptysis, sputum production, shortness of breath and wheezing.   Cardiovascular: Negative for chest pain, palpitations, orthopnea, claudication, leg swelling and PND.  Gastrointestinal: Negative for nausea and vomiting.  Musculoskeletal: Negative for falls.  Skin: Negative for rash.  Neurological: Positive for weakness. Negative for sensory change, speech change, focal weakness and loss of consciousness.  Endo/Heme/Allergies: Does not bruise/bleed easily.  Psychiatric/Behavioral: The patient is not nervous/anxious.      Objective: Telemetry: sinus rhythm 70's Physical Exam: Blood pressure 177/83, pulse 62, temperature 98.6 F (37 C), temperature source Oral, resp. rate 18, height 5\' 4"  (1.626 m), weight 167 lb 8 oz (75.978 kg), SpO2 96 %. Body mass index is 28.74 kg/(m^2). General: Well developed, well nourished, in no acute distress. Head: Normocephalic, atraumatic, sclera non-icteric, no xanthomas, nares are without discharge. Neck: Negative for carotid bruits. JVP not elevated. Lungs: Clear bilaterally to auscultation without wheezes, rales, or rhonchi. Breathing is unlabored. Heart: RRR S1  S2 without murmurs, rubs, or gallops.  Abdomen: Soft, non-tender, non-distended with normoactive bowel sounds. No rebound/guarding. Extremities: No clubbing or cyanosis. No edema. Pressure band in place at right radial cath site, no active bleeding. Neuro: Alert and oriented X 3. Moves all extremities spontaneously. Psych:  Responds to questions appropriately with a normal affect.   Intake/Output Summary (Last 24 hours) at 06/26/15 1126 Last data filed at 06/26/15 0700  Gross per 24 hour  Intake 729.73 ml  Output    250 ml  Net 479.73 ml    Inpatient Medications:  . [MAR Hold] amLODipine  5 mg Oral Daily  . [START ON 06/27/2015] aspirin  81 mg Oral Pre-Cath  . [MAR Hold] aspirin EC  81 mg Oral Daily  . [MAR Hold] atorvastatin  40 mg Oral q1800  . [MAR Hold] clopidogrel  75 mg Oral Daily  . [MAR Hold] docusate sodium  100 mg Oral BID  . [MAR Hold] insulin aspart  0-5 Units Subcutaneous QHS  . [MAR Hold] insulin aspart  0-9 Units Subcutaneous TID WC  . [MAR Hold] insulin glargine  20 Units Subcutaneous QHS  . [MAR Hold] isosorbide mononitrate  30 mg Oral BID  . [MAR Hold] metoprolol tartrate  75 mg Oral BID  . [MAR Hold] sodium chloride  3 mL Intravenous Q12H  . sodium chloride  3 mL Intravenous Q12H   Infusions:  . sodium chloride 500 mL/hr at 06/22/15 0744  . sodium chloride 100 mL/hr at 06/26/15 0510  . sodium chloride    . heparin 1,200 Units/hr (06/26/15 0244)    Labs:  Recent Labs  06/25/15 0422  NA 134*  K 4.0  CL 105  CO2 23  GLUCOSE 152*  BUN 31*  CREATININE 1.39*  CALCIUM 8.4*   No results for input(s): AST, ALT, ALKPHOS, BILITOT, PROT, ALBUMIN in the last 72 hours.  Recent Labs  06/25/15 0422 06/26/15 0414  WBC 10.0 8.8  HGB 8.9* 8.6*  HCT 26.6* 25.5*  MCV 85.2 84.8  PLT 353 325   No results for input(s): CKTOTAL, CKMB, TROPONINI in the last 72 hours. Invalid input(s): POCBNP No results for input(s): HGBA1C in the last 72 hours.    Weights: Filed Weights   06/24/15 0414 06/25/15 0516 06/26/15 0427  Weight: 168 lb (76.204 kg) 166 lb 14.4 oz (75.705 kg) 167 lb 8 oz (75.978 kg)     Radiology/Studies:  Dg Chest Port 1 View  06/19/2015  CLINICAL DATA:  Cold, cough and shortness of Breath for 9 days. EXAM: PORTABLE CHEST 1 VIEW COMPARISON:  None. FINDINGS: The heart is mildly enlarged. The mediastinal and hilar contours are grossly normal. Low lung volumes with vascular crowding and bibasilar atelectasis. Probable small bilateral pleural effusions. Right upper lobe infiltrate is noted. The bony thorax is intact. IMPRESSION: Right upper lobe pneumonia. Low lung volumes with vascular crowding, bibasilar atelectasis and small pleural effusions. Electronically Signed   By: Marijo Sanes M.D.   On: 06/19/2015 09:26     Assessment and Plan   1. Acute on chronic systolic heart failure/ischemic cardiomyopathy: -LVEF 30-35% by echo on 1/9 -Remains euvolemic -Continue Lopressor 75 mg bid -Would look to start lisinopril or Entresto vs adding hydralazine to her Imdur on 1/17 if renal function is stable given her cardiomyopathy  -If renal function improves consider adding spironolactone at follow up for cardiac remodeling -Ambulate this PM as tolerated   2. NSTEMI/3 vessel CAD with poor targets for CABG: -Likely major component of demand ischemia in setting pneumonia and heart failure  -PCI as above to the mid LCx -FFR of 0.88 to mid LAD, not significant. With residual coronary disease as above -On aspirin and Plavix  -She would likely discontinue aspirin after 1 month (07/27/2015) then continue on with Plavix and either Eliquis or Xarelto from there -Lopressor as above -Continue Imdur  -Lipitor 40 mg  -Referral to Phase II Cardiac Rehab   3. PAF: -Maintaining sinus rhythm  -CHADS2VASc of 5 (CHF, HTN, DM, vascular disease, sex category) -Will need Eliquis 5 mg bid (she does not meet 2 of 3 requirements for dosage decrease)  or Xarelto 15 mg q dinner (CrCl 48.9 mL/min) -This places her on triple therapy for a short while as above -Lopressor 75 mg bid -Likely in the setting of her PNA  4. AKI on CKD III:  -Stable -Continue to monitor for contrast induced nephropathy  -Hydrate  -Minimize nephrotoxic agents  5. Diabetes: -Per primary team  6. PNA: -Improved  7. HTN: -Currently not controlled -Add hydralazine 25 mg tid   Melvern Banker, PA-C Pager: [phone] 06/26/2015, 11:26 AM

## 2015-06-26 NOTE — Progress Notes (Signed)
Chewable ASA not scheduled for patient to take for cath procedure. Dr. Lavetta Nielsen notified and  he indicated the RN can put an order in for ASA 81mg  chewable x 1 dose. NO acute distress noted, patient denied pain. Heparin still running at 12 mL/hour, no bruising or bleeding noted.

## 2015-06-26 NOTE — H&P (View-Only) (Signed)
    Subjective:  No CP or dyspnea. Continues to have dry cough.  Objective:  Vital Signs in the last 24 hours: Temp:  [98 F (36.7 C)-98.4 F (36.9 C)] 98.4 F (36.9 C) (01/15 1059) Pulse Rate:  [62-73] 66 (01/15 1129) Resp:  [16-20] 20 (01/15 1059) BP: (151-185)/(66-91) 185/80 mmHg (01/15 1129) SpO2:  [91 %-95 %] 91 % (01/15 1059) Weight:  [166 lb 14.4 oz (75.705 kg)] 166 lb 14.4 oz (75.705 kg) (01/15 0516)  Intake/Output from previous day: 01/14 0701 - 01/15 0700 In: 720 [P.O.:720] Out: 200 [Urine:200]  Physical Exam: Pt is alert and oriented, NAD HEENT: normal Neck: JVP - normal Lungs: CTA bilaterally CV: RRR without murmur or gallop Abd: soft, NT, Positive BS, no hepatomegaly Ext: no C/C/E, distal pulses intact and equal Skin: warm/dry no rash   Lab Results:  Recent Labs  06/24/15 0412 06/25/15 0422  WBC 10.7 10.0  HGB 9.2* 8.9*  PLT 355 353    Recent Labs  06/23/15 0608 06/25/15 0422  NA 133* 134*  K 3.8 4.0  CL 102 105  CO2 23 23  GLUCOSE 103* 152*  BUN 35* 31*  CREATININE 1.60* 1.39*   No results for input(s): TROPONINI in the last 72 hours.  Invalid input(s): CK, MB  Tele: Personally reviewed: sinus rhythm  Assessment/Plan:  1. Acute on chronic systolic heart failure, LVEF 30-35% by echo. Remains euvolemic 2. NSTEMI - likely major component of demand ischemia in setting pneumonia and heart failure. PCI tomorrow 3. 3 vessel CAD with plans for FFR-directed PCI Monday. Diffuse distal vessel disease with poor targets for CABG. Loaded with plavix yesterday. 4. AKI on CKD III: creatinine improved today. Minimize nephrotoxic agents as repeat contrast administration planned 1/16. No ACE/ARB. 5. Diabetes - per primary team 6. Paroxysmal Atrial Fibrillation - plans noted per Dr Rockey Situ for antiplatelet Rx x 1 month with ASA and plavix, then likely DOAC and plavix. Maintaining sinus rhythm on monitor - may have been driven by pneumonia.  Dispo: PCI  tomorrow, likely hospital discharge Tuesday   Sherren Mocha, M.D. 06/25/2015, 12:54 PM

## 2015-06-26 NOTE — Interval H&P Note (Signed)
History and Physical Interval Note:  06/26/2015 7:43 AM  Theresa Rogers  has presented today for surgery, with the diagnosis of chest pain  The various methods of treatment have been discussed with the patient and family. After consideration of risks, benefits and other options for treatment, the patient has consented to  Procedure(s): Coronary Stent Intervention (N/A) as a surgical intervention .  The patient's history has been reviewed, patient examined, no change in status, stable for surgery.  I have reviewed the patient's chart and labs.  Questions were answered to the patient's satisfaction.     Kathlyn Sacramento

## 2015-06-26 NOTE — Consult Note (Signed)
ANTICOAGULATION CONSULT NOTE -Follow-up  Pharmacy Consult for heparin Indication: atrial fibrillation  No Known Allergies  Patient Measurements: Height: 5\' 4"  (162.6 cm) Weight: 167 lb 8 oz (75.978 kg) IBW/kg (Calculated) : 54.7 Heparin Dosing Weight: 69.3 kg  Vital Signs: Temp: 98.1 F (36.7 C) (01/16 0427) Temp Source: Oral (01/16 0427) BP: 156/68 mmHg (01/16 0427) Pulse Rate: 58 (01/16 0427)  Labs:  Recent Labs  06/23/15 0608 06/24/15 0412 06/25/15 0422 06/26/15 0414  HGB 9.7* 9.2* 8.9* 8.6*  HCT 29.1* 27.4* 26.6* 25.5*  PLT 399 355 353 325  HEPARINUNFRC 0.62 0.60 0.53 0.61  CREATININE 1.60*  --  1.39*  --     Estimated Creatinine Clearance: 40.8 mL/min (by C-G formula based on Cr of 1.39).   Medical History: Past Medical History  Diagnosis Date  . CAD (coronary artery disease)     a. s/p MI/syncope in 2009 s/p remote stenting  . Diabetes mellitus without complication (HCC)     Medications:  Scheduled:   . amLODipine  5 mg Oral Daily  . aspirin EC  81 mg Oral Daily  . atorvastatin  40 mg Oral q1800  . clopidogrel  75 mg Oral Daily  . docusate sodium  100 mg Oral BID  . insulin aspart  0-5 Units Subcutaneous QHS  . insulin aspart  0-9 Units Subcutaneous TID WC  . insulin glargine  20 Units Subcutaneous QHS  . isosorbide mononitrate  30 mg Oral BID  . metoprolol tartrate  75 mg Oral BID  . sodium chloride  3 mL Intravenous Q12H  . sodium chloride  3 mL Intravenous Q12H   Infusions:  . sodium chloride 500 mL/hr at 06/22/15 0744  . sodium chloride 100 mL/hr at 06/26/15 0510  . heparin 1,200 Units/hr (06/26/15 0244)    Assessment: Theresa Rogers is a 65yo female with atrial fibrillation. Pharmacy consulted to dose heparin in this patient.  Goal of Therapy:  Heparin level 0.3-0.7 units/ml Monitor platelets by anticoagulation protocol: Yes   Plan:  Two consecutive heparin levels at goal so will continue heparin infusion at 900 units/hr and f/u am labs.    1/11: Heparin level @0633 =0.25.  Will give 1000 units bolus x 1 and increase drip rate to 1000 units/hr. Will recheck in 6 hrs and f/u CBC in am  1/11: Heparin level @ 1329=0.11. Will give 2000 unit bolus and increase drip to 1200 units/hr. Will recheck in 6 hrs.  1/11 PM heparin level 0.53. Continue current regimen and recheck with AM labs tomorrow.  1/12 AM heparin level 0.61. Continue current regimen. Recheck with CBC tomorrow AM.  1/13 AM heparin level 0.62. Continue current regimen. Recheck with CBC tomorrow AM.  1/14 AM heparin level 0.60. Continue current regimen. Recheck with CBC tomorrow AM.  1/15 AM heparin level 0.53. Continue current regimen. Recheck with CBC tomorrow AM.  1/16 AM heparin level 0.61. Continue current regimen. Recheck with CBC tomorrow AM.   Sim Boast, PharmD, BCPS  06/26/2015

## 2015-06-27 LAB — BASIC METABOLIC PANEL
ANION GAP: 4 — AB (ref 5–15)
BUN: 31 mg/dL — ABNORMAL HIGH (ref 6–20)
CALCIUM: 8.2 mg/dL — AB (ref 8.9–10.3)
CHLORIDE: 106 mmol/L (ref 101–111)
CO2: 25 mmol/L (ref 22–32)
CREATININE: 1.57 mg/dL — AB (ref 0.44–1.00)
GFR calc non Af Amer: 34 mL/min — ABNORMAL LOW (ref >60)
GFR, EST AFRICAN AMERICAN: 39 mL/min — AB (ref >60)
GLUCOSE: 238 mg/dL — AB (ref 65–99)
Potassium: 4 mmol/L (ref 3.5–5.1)
Sodium: 135 mmol/L (ref 135–145)

## 2015-06-27 LAB — GLUCOSE, CAPILLARY
GLUCOSE-CAPILLARY: 137 mg/dL — AB (ref 65–99)
GLUCOSE-CAPILLARY: 176 mg/dL — AB (ref 65–99)
Glucose-Capillary: 180 mg/dL — ABNORMAL HIGH (ref 65–99)

## 2015-06-27 LAB — CBC
HCT: 26.5 % — ABNORMAL LOW (ref 35.0–47.0)
Hemoglobin: 8.8 g/dL — ABNORMAL LOW (ref 12.0–16.0)
MCH: 28.4 pg (ref 26.0–34.0)
MCHC: 33.4 g/dL (ref 32.0–36.0)
MCV: 85 fL (ref 80.0–100.0)
PLATELETS: 319 10*3/uL (ref 150–440)
RBC: 3.11 MIL/uL — ABNORMAL LOW (ref 3.80–5.20)
RDW: 13.1 % (ref 11.5–14.5)
WBC: 9.9 10*3/uL (ref 3.6–11.0)

## 2015-06-27 MED ORDER — METOPROLOL TARTRATE 75 MG PO TABS: 75.0000 mg | ORAL_TABLET | Freq: Two times a day (BID) | ORAL | Status: DC

## 2015-06-27 MED ORDER — ASPIRIN 81 MG PO TBEC: 81.0000 mg | DELAYED_RELEASE_TABLET | Freq: Every day | ORAL | Status: DC

## 2015-06-27 MED ORDER — CLOPIDOGREL BISULFATE 75 MG PO TABS: 75.0000 mg | ORAL_TABLET | Freq: Every day | ORAL | Status: DC

## 2015-06-27 MED ORDER — LEVOFLOXACIN 250 MG PO TABS: 250.0000 mg | ORAL_TABLET | Freq: Every day | ORAL | Status: DC

## 2015-06-27 MED ORDER — ATORVASTATIN CALCIUM 40 MG PO TABS
40.0000 mg | ORAL_TABLET | Freq: Every day | ORAL | Status: DC
Start: 2015-06-27 — End: 2018-05-12

## 2015-06-27 MED ORDER — ISOSORBIDE MONONITRATE ER 30 MG PO TB24: 30.0000 mg | ORAL_TABLET | Freq: Two times a day (BID) | ORAL | Status: DC

## 2015-06-27 MED ORDER — AMLODIPINE BESYLATE 5 MG PO TABS: 5.0000 mg | ORAL_TABLET | Freq: Every day | ORAL | Status: DC

## 2015-06-27 MED ORDER — HYDRALAZINE HCL 25 MG PO TABS: 25.0000 mg | ORAL_TABLET | Freq: Three times a day (TID) | ORAL | Status: DC

## 2015-06-27 NOTE — Progress Notes (Signed)
Patient: Theresa Rogers / Admit Date: 06/19/2015 / Date of Encounter: 06/27/2015, 9:44 AM   Subjective: Feels well, no complaints, has ambulated OOB around room, Would like to go home Denies chest pain  Review of Systems: Review of Systems  Constitutional: Negative.   Respiratory: Negative.   Cardiovascular: Negative.   Gastrointestinal: Negative.   Musculoskeletal: Negative.   Neurological: Negative.   Psychiatric/Behavioral: Negative.   All other systems reviewed and are negative.   Objective: Telemetry: NSR Physical Exam: Blood pressure 128/72, pulse 64, temperature 98.3 F (36.8 C), temperature source Oral, resp. rate 18, height 5\' 4"  (1.626 m), weight 169 lb 14.4 oz (77.066 kg), SpO2 91 %. Body mass index is 29.15 kg/(m^2). General: Well developed, well nourished, in no acute distress, appears pale Head: Normocephalic, atraumatic, sclera non-icteric, no xanthomas, nares are without discharge. Neck: Negative for carotid bruits. JVP not elevated. Lungs: Clear bilaterally to auscultation without wheezes, rales, or rhonchi. Breathing is unlabored. Heart: RRR S1 S2 without murmurs, rubs, or gallops.  Abdomen: Soft, non-tender, non-distended with normoactive bowel sounds. No rebound/guarding. Extremities: No clubbing or cyanosis. No edema. Distal pedal pulses are 2+ and equal bilaterally. Neuro: Alert and oriented X 3. Moves all extremities spontaneously. Psych:  Responds to questions appropriately with a normal affect.   Intake/Output Summary (Last 24 hours) at 06/27/15 0944 Last data filed at 06/27/15 0925  Gross per 24 hour  Intake    720 ml  Output    450 ml  Net    270 ml    Inpatient Medications:  . amLODipine  5 mg Oral Daily  . aspirin EC  81 mg Oral Daily  . atorvastatin  40 mg Oral q1800  . clopidogrel  75 mg Oral Daily  . docusate sodium  100 mg Oral BID  . hydrALAZINE  25 mg Oral 3 times per day  . insulin aspart  0-5 Units Subcutaneous QHS  . insulin  aspart  0-9 Units Subcutaneous TID WC  . insulin glargine  20 Units Subcutaneous QHS  . isosorbide mononitrate  30 mg Oral BID  . metoprolol tartrate  75 mg Oral BID  . sodium chloride  3 mL Intravenous Q12H  . sodium chloride  3 mL Intravenous Q12H   Infusions:  . sodium chloride 500 mL/hr at 06/22/15 0744    Labs:  Recent Labs  06/25/15 0422 06/27/15 0407  NA 134* 135  K 4.0 4.0  CL 105 106  CO2 23 25  GLUCOSE 152* 238*  BUN 31* 31*  CREATININE 1.39* 1.57*  CALCIUM 8.4* 8.2*   No results for input(s): AST, ALT, ALKPHOS, BILITOT, PROT, ALBUMIN in the last 72 hours.  Recent Labs  06/26/15 0414 06/27/15 0407  WBC 8.8 9.9  HGB 8.6* 8.8*  HCT 25.5* 26.5*  MCV 84.8 85.0  PLT 325 319   No results for input(s): CKTOTAL, CKMB, TROPONINI in the last 72 hours. Invalid input(s): POCBNP No results for input(s): HGBA1C in the last 72 hours.   Weights: Filed Weights   06/25/15 0516 06/26/15 0427 06/27/15 0401  Weight: 166 lb 14.4 oz (75.705 kg) 167 lb 8 oz (75.978 kg) 169 lb 14.4 oz (77.066 kg)     Radiology/Studies:  Dg Chest Port 1 View  06/19/2015  CLINICAL DATA:  Cold, cough and shortness of Breath for 9 days. EXAM: PORTABLE CHEST 1 VIEW COMPARISON:  None. FINDINGS: The heart is mildly enlarged. The mediastinal and hilar contours are grossly normal. Low lung volumes with vascular  crowding and bibasilar atelectasis. Probable small bilateral pleural effusions. Right upper lobe infiltrate is noted. The bony thorax is intact. IMPRESSION: Right upper lobe pneumonia. Low lung volumes with vascular crowding, bibasilar atelectasis and small pleural effusions. Electronically Signed   By: Marijo Sanes M.D.   On: 06/19/2015 09:26     Assessment and Plan  65 y.o. female    1. Acute on chronic systolic heart failure/ischemic cardiomyopathy: -LVEF 30-35% by echo on 1/9 -Continue Lopressor 75 mg bid, could change to coreg as an outpt On  hydralazine  Imdur  given her  cardiomyopathy  Ace or ARB as an outpt based on renal function -If renal function improves consider adding spironolactone at follow up for cardiac remodeling  2. NSTEMI/3 vessel CAD with poor targets for CABG: -PCI  to the mid LCx yesterday -FFR of 0.88 to mid LAD, not significant. With residual coronary disease of RCA, pci as an outpt per Dr. Fletcher Anon -On aspirin and Plavix  -She  discontinue aspirin after 1 month then continue on with Plavix and either Eliquis or Xarelto for par. Atrial fib -Lopressor as above -Continue Imdur  -Lipitor 40 mg  -Referral to Phase II Cardiac Rehab   3. PAF: -Maintaining sinus rhythm  -CHADS2VASc of 5 (CHF, HTN, DM, vascular disease, sex category) -Will need Eliquis 5 mg bid (she does not meet 2 of 3 requirements for dosage decrease) or Xarelto 15 mg q dinner (CrCl 48.9 mL/min) in one month (can be started in clinic) -Lopressor 75 mg bid -Likely in the setting of her PNA  4. AKI on CKD III:  -Stable -Minimize nephrotoxic agents  5. Diabetes: -Per primary team  6. PNA: -Improved  7. HTN: -Currentlycontrolled  8. Anemia HCT 25 to 26 Too high risk to start on triple therapy (would keep on asa and plavix,  eliquis /xarelto at a later date)   Signed, Esmond Plants, MD 06/27/2015, 9:44 AM

## 2015-06-27 NOTE — Progress Notes (Signed)
Inpatient Diabetes Program Recommendations  AACE/ADA: New Consensus Statement on Inpatient Glycemic Control (2015)  Target Ranges:  Prepandial:   less than 140 mg/dL      Peak postprandial:   less than 180 mg/dL (1-2 hours)      Critically ill patients:  140 - 180 mg/dL   Review of Glycemic Control Results for Theresa Rogers, Theresa Rogers (MRN [id number]) as of 06/27/2015 08:59  Ref. Range 06/25/2015 21:52 06/26/2015 12:15 06/26/2015 16:40 06/26/2015 21:26 06/27/2015 07:52  Glucose-Capillary Latest Ref Range: 65-99 mg/dL 237 (H) 70 135 (H) 252 (H) 180 (H)    Outpatient Diabetes medications: Lantus 20 units qday, Humalog 8-12 units tid Current orders for Inpatient glycemic control: Lantus 20 units qhs, Novolog 0-9 units tid, Novolog 0-5 units qhs  Inpatient Diabetes Program Recommendations: Consider adding Novolog 2 units tid with meals (so she'll get insulin at each meal and decrease the possibility of high blood sugars when "correction insulin" is not given).  Continue Novolog correction as ordered.   Gentry Fitz, RN, BA, MHA, CDE Diabetes Coordinator Inpatient Diabetes Program  [phone] (Team Pager) [phone] ([place]) 06/27/2015 9:05 AM

## 2015-06-27 NOTE — Progress Notes (Signed)
Patient given discharge teaching and paperwork regarding medications, diet, follow-up appointments and activity. Patient understanding verbalized. No complaints at this time. IV and telemetry discontinued prior to leaving. Skin assessment as previously charted and vitals are stable; on room air. Patient being discharged to home. Escorted via nurse tech to ride home with daughter.

## 2015-06-27 NOTE — Discharge Instructions (Signed)

## 2015-06-27 NOTE — Progress Notes (Signed)
Per Dr. Darvin Neighbours, ok for patient to take a shower without telemetry prior to discharge.

## 2015-06-28 ENCOUNTER — Telehealth: Payer: Self-pay

## 2015-06-28 ENCOUNTER — Other Ambulatory Visit: Payer: Self-pay

## 2015-06-28 MED ORDER — HYDRALAZINE HCL 50 MG PO TABS: 50.0000 mg | ORAL_TABLET | Freq: Three times a day (TID) | ORAL | Status: DC

## 2015-06-28 NOTE — Telephone Encounter (Signed)
Patient contacted regarding discharge from [facility] on 06/27/15.  Patient understands to follow up with provider R. Dunn on 1/23 at 8:30am at [facility]. Patient understands discharge instructions? yes Patient understands medications and regiment? yes Patient understands to bring all medications to this visit? yes  Pt reports bilateral ankle edema while in the hospital. States she should have mentioned this before d/c.  Edema continues. Skin feels "tight", toes "look like sausages". She feels it could be one of the new medications she was prescribed. She has not picked up new meds and would like MD to review and advise before she takes her meds. Forward to [employer], [place]

## 2015-06-28 NOTE — Telephone Encounter (Signed)
Most likely 2/2 the amlodipine. Please have her discontinue this and take it off her medication list. Please have her increase the hydralazine to 50 mg tid and check her BP 2 to 3 times daily. If she is consistently getting readings >140/90 have her call us for further recommendations.

## 2015-06-28 NOTE — Telephone Encounter (Signed)
Reviewed Ryan's recommendations w/pt who is agreeable w/plan. States she has not had any of her new meds filled. She has written prescriptions. Advised her to discard the hydralazine 25mg  TID prescription as I am submitting new dosage. She asks we sent it to [facility] in [place]. She understands to not take amlodipine Reviewed times to check BP and parameters. Pt verbalized understanding with no further questions.

## 2015-06-28 NOTE — Telephone Encounter (Signed)
TCM call. Left message on machine for patient to contact the office.

## 2015-06-30 NOTE — Discharge Summary (Signed)
[facility] NAME: Theresa Rogers    MR#:  [id number]  DATE OF BIRTH:  05-29-1951  DATE OF ADMISSION:  06/19/2015 ADMITTING PHYSICIAN: Hillary Bow, MD  DATE OF DISCHARGE: 06/27/2015  4:13 PM  PRIMARY CARE PHYSICIAN: Pcp Not In System    ADMISSION DIAGNOSIS:  Community acquired pneumonia [J18.9] NSTEMI (non-ST elevated myocardial infarction) ([place]) [I21.4] Rapid atrial fibrillation ([place]) [I48.91]  DISCHARGE DIAGNOSIS:  Active Problems:   A-fib ([place])   Community acquired pneumonia   NSTEMI (non-ST elevated myocardial infarction) ([place])   Rapid atrial fibrillation (HCC)   Congestive dilated cardiomyopathy (HCC)   Coronary artery disease involving native coronary artery of native heart without angina pectoris   Acute renal failure with tubular necrosis (HCC)   Pleural effusion   SECONDARY DIAGNOSIS:   Past Medical History  Diagnosis Date  . CAD (coronary artery disease)     a. s/p MI/syncope in 2009 s/p remote stenting  . Diabetes mellitus without complication [facility])      ADMITTING HISTORY  Theresa Rogers is a 65 y.o. female with a known history of CAD, IDDM, HTN here with SOB, fatigue. Tachycardic with Afib on arrival into 140s. Given cardizem bolus IV and PO 30mg . Now 85-125 between Afib and NSR. Symptoms started out as cold and progressively worsened over 7 days. Has orthopnea and edema.  PCI 8 yrs back - 1 stent. Last stress test 5 years back. Moved from [place] 3 months back.   HOSPITAL COURSE:   * NSTEMI Patient had cardiac catheterization 06/21/1998 617 with three-vessel disease Patient had repeat cardiac catheterization and stent placement in left circumflex.   On Betablocker, statin, ASA  Discontinued heparin drip prior to cardiac catheterization  Patient received 1 unit of blood transfusion 1/12 and hemoglobin is at 9.7--8.9--8.6 (probably from hemodilution on heparin drip ,and blood  work) [place] cardiology recommendations Patient discharged home on aspirin, Plavix, statin, metoprolol. Cardiac rehabilitation referral to discharge. Follow-up with cardiology in 1 week.  * Afib with RVR 2/2 due to pneumonia.   Rate controlled on oral metoprolol. Checked TSH mildly elevated. Free T3 slightly low, Free T4- normal. Repeat thyroid function tests in 6 weeks after discharge echocardiogram with 30-35% ef appreciat consult cardiology. Due to patient being on both aspirin and Plavix. Addition of 0 to or Eliquis was deferred by 1 month by cardiology.  * Essential hypertension with elevated blood pressures Imdur dose is increased to 30 mg by mouth twice a day -increased metoprolol to 75mg  by mouth twice a day.    * RUL CAP-clinically improving on Levaquin. Weaned off oxygen. Nebulizers as needed. Prescription given for 2 more doses of Levaquin at discharge    * ARF Improved from 1.9-1.5. She did have 2 contrast loads with cardiac catheterization and creatinine is stable at discharge.   * IDDM-uncontrolled with hemoglobin A1c at 11.6 titrate home dose of insulin along with sliding scale. Diabetic diet. Accu-Cheks are okay at this time  * DVT prophylaxis Was On heparin drip. Switch to subcutaneous  * Anemia of chronic disease stable    CONSULTS OBTAINED:  Treatment Team:  Wellington Hampshire, MD Nicholes Mango, MD  DRUG ALLERGIES:  No Known Allergies  DISCHARGE MEDICATIONS:   Discharge Medication List as of 06/27/2015  1:57 PM    START taking these medications   Details  aspirin EC 81 MG EC tablet Take 1 tablet (81 mg total) by mouth daily., Starting 06/27/2015, Until Discontinued, OTC  atorvastatin (LIPITOR) 40 MG tablet Take 1 tablet (40 mg total) by mouth daily at 6 PM., Starting 06/27/2015, Until Discontinued, Print    clopidogrel (PLAVIX) 75 MG tablet Take 1 tablet (75 mg total) by mouth daily., Starting 06/27/2015, Until Discontinued, Print     isosorbide mononitrate (IMDUR) 30 MG 24 hr tablet Take 1 tablet (30 mg total) by mouth 2 (two) times daily., Starting 06/27/2015, Until Discontinued, Print    levofloxacin (LEVAQUIN) 250 MG tablet Take 1 tablet (250 mg total) by mouth daily., Starting 06/27/2015, Until Discontinued, Print    Metoprolol Tartrate 75 MG TABS Take 75 mg by mouth 2 (two) times daily., Starting 06/27/2015, Until Discontinued, Print    amLODipine (NORVASC) 5 MG tablet Take 1 tablet (5 mg total) by mouth daily., Starting 06/27/2015, Until Discontinued, Normal    hydrALAZINE (APRESOLINE) 25 MG tablet Take 1 tablet (25 mg total) by mouth every 8 (eight) hours., Starting 06/27/2015, Until Discontinued, Print      CONTINUE these medications which have NOT CHANGED   Details  insulin glargine (LANTUS) 100 UNIT/ML injection Inject 20 Units into the skin at bedtime., Until Discontinued, Historical Med    insulin lispro (HUMALOG) 100 UNIT/ML injection Inject 8-12 Units into the skin 3 (three) times daily before meals., Until Discontinued, Historical Med         Today    VITAL SIGNS:  Blood pressure 141/71, pulse 62, temperature 97.5 F (36.4 C), temperature source Oral, resp. rate 18, height 5\' 4"  (1.626 m), weight 77.066 kg (169 lb 14.4 oz), SpO2 91 %.  I/O:  No intake or output data in the 24 hours ending 06/30/15 1524  PHYSICAL EXAMINATION:  Physical Exam  GENERAL:  65 y.o.-year-old patient lying in the bed with no acute distress.  LUNGS: Normal breath sounds bilaterally, no wheezing, rales,rhonchi or crepitation. No use of accessory muscles of respiration.  CARDIOVASCULAR: S1, S2 normal. No murmurs, rubs, or gallops.  ABDOMEN: Soft, non-tender, non-distended. Bowel sounds present. No organomegaly or mass.  NEUROLOGIC: Moves all 4 extremities. PSYCHIATRIC: The patient is alert and oriented x 3.  SKIN: No obvious rash, lesion, or ulcer.   DATA REVIEW:   CBC  Recent Labs Lab 06/27/15 0407  WBC 9.9  HGB  8.8*  HCT 26.5*  PLT 319    Chemistries   Recent Labs Lab 06/27/15 0407  NA 135  K 4.0  CL 106  CO2 25  GLUCOSE 238*  BUN 31*  CREATININE 1.57*  CALCIUM 8.2*    Cardiac Enzymes No results for input(s): TROPONINI in the last 168 hours.  Microbiology Results  No results found for this or any previous visit.  RADIOLOGY:  No results found.    Follow up with PCP in 1 week.  Management plans discussed with the patient, family and they are in agreement.  CODE STATUS:  Code Status History    Date Active Date Inactive Code Status Order ID Comments User Context   06/26/2015 12:16 PM 06/27/2015  7:14 PM Full Code [id number]  Wellington Hampshire, MD Inpatient   06/22/2015  9:26 AM 06/26/2015 12:16 PM Full Code [id number]  Minna Merritts, MD Inpatient   06/19/2015 11:35 AM 06/22/2015  9:26 AM Full Code [id number]  Hillary Bow, MD ED      TOTAL TIME TAKING CARE OF THIS PATIENT ON DAY OF DISCHARGE: more than 30 minutes.    Hillary Bow R M.D on 06/30/2015 at 3:24 PM  Between 7am to 6pm - Pager - [phone]  After 6pm go to [email or web address] - password [email or web address] [facility]  Office  [phone]  CC: Primary care physician; Pcp Not In System     Note: This dictation was prepared with Dragon dictation along with smaller phrase technology. Any transcriptional errors that result from this process are unintentional.

## 2015-07-01 ENCOUNTER — Encounter: Payer: Self-pay | Admitting: Physician Assistant

## 2015-07-01 DIAGNOSIS — E785 Hyperlipidemia, unspecified: Secondary | ICD-10-CM | POA: Insufficient documentation

## 2015-07-01 DIAGNOSIS — D649 Anemia, unspecified: Secondary | ICD-10-CM | POA: Insufficient documentation

## 2015-07-01 DIAGNOSIS — I480 Paroxysmal atrial fibrillation: Secondary | ICD-10-CM | POA: Insufficient documentation

## 2015-07-01 DIAGNOSIS — I2510 Atherosclerotic heart disease of native coronary artery without angina pectoris: Secondary | ICD-10-CM | POA: Insufficient documentation

## 2015-07-01 DIAGNOSIS — I10 Essential (primary) hypertension: Secondary | ICD-10-CM | POA: Insufficient documentation

## 2015-07-01 DIAGNOSIS — I5022 Chronic systolic (congestive) heart failure: Secondary | ICD-10-CM | POA: Insufficient documentation

## 2015-07-01 DIAGNOSIS — I255 Ischemic cardiomyopathy: Secondary | ICD-10-CM | POA: Insufficient documentation

## 2015-07-02 NOTE — Progress Notes (Signed)
Cardiology Office Note Date:  07/02/2015  Patient ID:  Theresa Rogers, DOB Apr 27, 1951, MRN [id number] PCP:  Pcp Not In System  Cardiologist:  Dr. Fletcher Anon, MD  refresh   Chief Complaint:   History of Present Illness: Theresa Rogers is a 65 y.o. female with history of CAD with s/p remote MI/syncope s/p remote PCI in 2009 in [place], and DM2 diagnosed in her 78's who presents for hospital follow up from her recent admission to [facility] from 1/9 to 1/17 with a NSTEMI, new onset Afib, acute on chronic systolic CHF, and CAP.   She was previously followed by her cardiologist in [place] starting back in 2009 after a possible MI/syncopal event at work. She denies any CPR/defibrillation at that time. She was taken to the cath lab in 2009 and underwent PCI to a single coronary artery. She subsequently underwent exercise stress testing yearly for the first 5 years after her above event with self reported normal results. She was admitted to [facility] 1/9 with increasing SOB and cough, productive of green sputum, c/w prior PNA in her 40's. Initial troponin was 1.42 with a peak of 2.94, BNP 1782, SCr 1.73, ECG showed new onset Afib with RVR 146 bpm, right axis deviation, prolonged Qt, inferior Q waves, inferolateral TWI, CXR with RUL PNA and small pleural effusions. She was placed on a heparin gtt. She converted to sinus rhythm on Cardizem gtt. She was diuresed with IV Lasix and eventially transited to PO. For her PNA she was treated with Levaquin. Echocardiogram showed an EF of 30-35%, diffuse HK, unable to exclude RWMA, LV diastolic function parameters were normal, mild MR, left atrium was mildly dilated, PASP 28 mm Hg, IVC was dilated c/w elevated CVP. Given her cardiomyopathy and elevated troponin she underwent a staged cardiac cath given her renal function on 1/12 that showed 3 vessel disesae of the proximal to mid LAD, mid LCx, and RCA as below in her PMH. She did not have good targets for CABG. She was hydrated to  preserve renal function and taken back to the cath lab on 1/16 for PCI/DES to te mid LCx. FFR to the mid LAD showed a ratio of 0.88 which was not felt to be clinically significant and PCI was deferred. She will need PCI of the RCA in several weeks time   Rhythm/Afib - will need to start anticoag first of Feb and d/c aspirin at that time, continue Plavix Heart failure and meds Scheduling staged cardiac cath Renal function anemia  Past Medical History  Diagnosis Date  . CAD (coronary artery disease)     a. s/p MI/syncope in 2009 s/p remote stenting; b. NSTEMI 06/2015, cardiac cath 06/2015 pLAD 70% FFR 0.88 not clinically sig, dLAD 90%, mLCx 80% s/p PCI/DES 0%, pRCA 70%, mRCA 80%, dRCA 80%  . Diabetes mellitus without complication ([place])   . Ischemic cardiomyopathy     a. echo 06/2015: EF 30-35%, diffuse HK, nable to exclude RWMA, LV diastolic function parameters were normal, mild MR, left atrium was mildly dilated, PASP 28 mm Hg, IVC was dilated c/w elevated CVP  . Chronic systolic CHF (congestive heart failure) ([place])   . PAF (paroxysmal atrial fibrillation) ([place])     a. new onset 06/2015; b. CHADS2VASc = 5 (CHF, HTN, age x 1, vascular dz, female); c. not on long term full dose anticoagulation until 07/2015 in an effort to avoid triple therapy given her anemia   . CKD (chronic kidney disease), stage III   . Anemia   .  HTN (hypertension)   . HLD (hyperlipidemia)     Past Surgical History  Procedure Laterality Date  . Coronary angioplasty with stent placement    . Cardiac catheterization N/A 06/22/2015    Procedure: Coronary Angiogram;  Surgeon: Minna Merritts, MD;  Location: [place] CV LAB;  Service: Cardiovascular;  Laterality: N/A;  . Cardiac catheterization N/A 06/26/2015    Procedure: Coronary Stent Intervention;  Surgeon: Wellington Hampshire, MD;  Location: [place] [facility];  Service: Cardiovascular;  Laterality: N/A;    Current Outpatient Prescriptions  Medication Sig Dispense  Refill  . aspirin EC 81 MG EC tablet Take 1 tablet (81 mg total) by mouth daily.    Marland Kitchen atorvastatin (LIPITOR) 40 MG tablet Take 1 tablet (40 mg total) by mouth daily at 6 PM. 30 tablet 0  . clopidogrel (PLAVIX) 75 MG tablet Take 1 tablet (75 mg total) by mouth daily. 30 tablet 0  . hydrALAZINE (APRESOLINE) 50 MG tablet Take 1 tablet (50 mg total) by mouth 3 (three) times daily. 90 tablet 3  . insulin glargine (LANTUS) 100 UNIT/ML injection Inject 20 Units into the skin at bedtime.    . insulin lispro (HUMALOG) 100 UNIT/ML injection Inject 8-12 Units into the skin 3 (three) times daily before meals.    . isosorbide mononitrate (IMDUR) 30 MG 24 hr tablet Take 1 tablet (30 mg total) by mouth 2 (two) times daily. 60 tablet 0  . levofloxacin (LEVAQUIN) 250 MG tablet Take 1 tablet (250 mg total) by mouth daily. 2 tablet 0  . Metoprolol Tartrate 75 MG TABS Take 75 mg by mouth 2 (two) times daily. 60 tablet 0   No current facility-administered medications for this visit.    Allergies:   Review of patient's allergies indicates no known allergies.   Social History:  The patient  reports that she has never smoked. She does not have any smokeless tobacco history on file. She reports that she does not drink alcohol or use illicit drugs.   Family History:  The patient's family history includes CAD in her mother; Diabetes in her brother; Kidney failure in her brother; Stroke in her father.  ROS:  Please see the history of present illness. Otherwise, review of systems is positive for .   All other systems are reviewed and otherwise negative.   PHYSICAL EXAM:  VS:  There were no vitals taken for this visit. BMI: There is no weight on file to calculate BMI. Well nourished, well developed, in no acute distress HEENT: normocephalic, atraumatic Neck: no JVD, carotid bruits or masses Cardiac:  normal S1, S2; RRR; no murmurs, rubs, or gallops Lungs:  clear to auscultation bilaterally, no wheezing, rhonchi or  rales Abd: soft, nontender, no hepatomegaly, + BS MS: no deformity or atrophy Ext: no edema Skin: warm and dry, no rash Neuro:  moves all extremities spontaneously, no focal abnormalities noted, follows commands Psych: euthymic mood, full affect   EKG:  Was ordered today. Shows   Recent Labs: 06/19/2015: ALT 60*; B Natriuretic Peptide 1782.0*; TSH 8.859* 06/27/2015: BUN 31*; Creatinine, Ser 1.57*; Hemoglobin 8.8*; Platelets 319; Potassium 4.0; Sodium 135  06/21/2015: Cholesterol 222*; HDL 41; LDL Cholesterol 149*; Total CHOL/HDL Ratio 5.4; Triglycerides 162*; VLDL 32   Estimated Creatinine Clearance: 36.4 mL/min (by C-G formula based on Cr of 1.57).   Wt Readings from Last 3 Encounters:  06/27/15 169 lb 14.4 oz (77.066 kg)     Other studies reviewed: Additional studies/records reviewed today include: summarized above  ASSESSMENT AND PLAN:  1. CAD s/p PCI/DES with residual coronary disease as above:   2. Ischemic cardiomyopathy with EF 30-35% by echo:   3. PAF:   4. CKD stage III:    5. Anemia:    6. HTN:   7. HLD:   Disposition: F/u with   Current medicines are reviewed at length with the patient today.  The patient did not have any concerns regarding medicines.  Melvern Banker PA-C 07/02/2015 9:14 AM     [address], [place] [address] [phone]  This encounter was created in error - please disregard.

## 2015-07-03 ENCOUNTER — Encounter: Payer: Self-pay | Admitting: Emergency Medicine

## 2015-07-03 ENCOUNTER — Encounter: Payer: Self-pay | Admitting: Physician Assistant

## 2015-07-03 ENCOUNTER — Emergency Department: Payer: Medicaid Other

## 2015-07-03 ENCOUNTER — Inpatient Hospital Stay
Admission: EM | Admit: 2015-07-03 | Discharge: 2015-07-05 | DRG: 280 | Disposition: A | Discharge status: Home / Self Care | Payer: Medicaid Other | Attending: Internal Medicine | Admitting: Internal Medicine

## 2015-07-03 ENCOUNTER — Encounter: Payer: Self-pay | Admitting: *Deleted

## 2015-07-03 DIAGNOSIS — I5023 Acute on chronic systolic (congestive) heart failure: Secondary | ICD-10-CM | POA: Insufficient documentation

## 2015-07-03 DIAGNOSIS — D649 Anemia, unspecified: Secondary | ICD-10-CM | POA: Diagnosis present

## 2015-07-03 DIAGNOSIS — I480 Paroxysmal atrial fibrillation: Secondary | ICD-10-CM | POA: Diagnosis present

## 2015-07-03 DIAGNOSIS — Z794 Long term (current) use of insulin: Secondary | ICD-10-CM

## 2015-07-03 DIAGNOSIS — N183 Chronic kidney disease, stage 3 unspecified: Secondary | ICD-10-CM

## 2015-07-03 DIAGNOSIS — Z8249 Family history of ischemic heart disease and other diseases of the circulatory system: Secondary | ICD-10-CM | POA: Diagnosis not present

## 2015-07-03 DIAGNOSIS — Z833 Family history of diabetes mellitus: Secondary | ICD-10-CM | POA: Diagnosis not present

## 2015-07-03 DIAGNOSIS — Z7902 Long term (current) use of antithrombotics/antiplatelets: Secondary | ICD-10-CM | POA: Diagnosis not present

## 2015-07-03 DIAGNOSIS — Z7982 Long term (current) use of aspirin: Secondary | ICD-10-CM

## 2015-07-03 DIAGNOSIS — I214 Non-ST elevation (NSTEMI) myocardial infarction: Secondary | ICD-10-CM | POA: Diagnosis present

## 2015-07-03 DIAGNOSIS — I25119 Atherosclerotic heart disease of native coronary artery with unspecified angina pectoris: Secondary | ICD-10-CM | POA: Diagnosis present

## 2015-07-03 DIAGNOSIS — Z955 Presence of coronary angioplasty implant and graft: Secondary | ICD-10-CM | POA: Diagnosis not present

## 2015-07-03 DIAGNOSIS — N179 Acute kidney failure, unspecified: Secondary | ICD-10-CM | POA: Diagnosis present

## 2015-07-03 DIAGNOSIS — E1122 Type 2 diabetes mellitus with diabetic chronic kidney disease: Secondary | ICD-10-CM | POA: Diagnosis present

## 2015-07-03 DIAGNOSIS — I255 Ischemic cardiomyopathy: Secondary | ICD-10-CM | POA: Diagnosis present

## 2015-07-03 DIAGNOSIS — E785 Hyperlipidemia, unspecified: Secondary | ICD-10-CM | POA: Diagnosis present

## 2015-07-03 DIAGNOSIS — I252 Old myocardial infarction: Secondary | ICD-10-CM

## 2015-07-03 DIAGNOSIS — M7989 Other specified soft tissue disorders: Secondary | ICD-10-CM | POA: Insufficient documentation

## 2015-07-03 DIAGNOSIS — I130 Hypertensive heart and chronic kidney disease with heart failure and stage 1 through stage 4 chronic kidney disease, or unspecified chronic kidney disease: Secondary | ICD-10-CM | POA: Diagnosis present

## 2015-07-03 DIAGNOSIS — Z823 Family history of stroke: Secondary | ICD-10-CM | POA: Diagnosis not present

## 2015-07-03 DIAGNOSIS — Z841 Family history of disorders of kidney and ureter: Secondary | ICD-10-CM

## 2015-07-03 DIAGNOSIS — I509 Heart failure, unspecified: Secondary | ICD-10-CM

## 2015-07-03 DIAGNOSIS — I209 Angina pectoris, unspecified: Secondary | ICD-10-CM | POA: Insufficient documentation

## 2015-07-03 LAB — CBC
HEMATOCRIT: 29.1 % — AB (ref 35.0–47.0)
HEMATOCRIT: 29.9 % — AB (ref 35.0–47.0)
HEMOGLOBIN: 9.7 g/dL — AB (ref 12.0–16.0)
HEMOGLOBIN: 9.9 g/dL — AB (ref 12.0–16.0)
MCH: 28.9 pg (ref 26.0–34.0)
MCH: 28.5 pg (ref 26.0–34.0)
MCHC: 33.4 g/dL (ref 32.0–36.0)
MCHC: 33 g/dL (ref 32.0–36.0)
MCV: 86.5 fL (ref 80.0–100.0)
MCV: 86.4 fL (ref 80.0–100.0)
Platelets: 363 10*3/uL (ref 150–440)
Platelets: 356 10*3/uL (ref 150–440)
RBC: 3.36 MIL/uL — AB (ref 3.80–5.20)
RBC: 3.46 MIL/uL — AB (ref 3.80–5.20)
RDW: 13.9 % (ref 11.5–14.5)
RDW: 13.7 % (ref 11.5–14.5)
WBC: 8.7 10*3/uL (ref 3.6–11.0)
WBC: 9 10*3/uL (ref 3.6–11.0)

## 2015-07-03 LAB — CREATININE, SERUM
CREATININE: 1.4 mg/dL — AB (ref 0.44–1.00)
GFR calc Af Amer: 45 mL/min — ABNORMAL LOW (ref >60)
GFR calc non Af Amer: 39 mL/min — ABNORMAL LOW (ref >60)

## 2015-07-03 LAB — TROPONIN I
TROPONIN I: 1.27 ng/mL — AB (ref <0.031)
Troponin I: 1.45 ng/mL — ABNORMAL HIGH (ref <0.031)
Troponin I: 1.21 ng/mL — ABNORMAL HIGH (ref <0.031)

## 2015-07-03 LAB — BASIC METABOLIC PANEL
ANION GAP: 8 (ref 5–15)
BUN: 32 mg/dL — AB (ref 6–20)
CO2: 22 mmol/L (ref 22–32)
Calcium: 8.6 mg/dL — ABNORMAL LOW (ref 8.9–10.3)
Chloride: 108 mmol/L (ref 101–111)
Creatinine, Ser: 1.52 mg/dL — ABNORMAL HIGH (ref 0.44–1.00)
GFR calc Af Amer: 41 mL/min — ABNORMAL LOW (ref >60)
GFR calc non Af Amer: 35 mL/min — ABNORMAL LOW (ref >60)
GLUCOSE: 269 mg/dL — AB (ref 65–99)
POTASSIUM: 4.1 mmol/L (ref 3.5–5.1)
Sodium: 138 mmol/L (ref 135–145)

## 2015-07-03 LAB — GLUCOSE, CAPILLARY
GLUCOSE-CAPILLARY: 231 mg/dL — AB (ref 65–99)
GLUCOSE-CAPILLARY: 314 mg/dL — AB (ref 65–99)
Glucose-Capillary: 201 mg/dL — ABNORMAL HIGH (ref 65–99)

## 2015-07-03 MED ORDER — FUROSEMIDE 10 MG/ML IJ SOLN
20.0000 mg | Freq: Three times a day (TID) | INTRAMUSCULAR | Status: DC
  Administered 2015-07-03 (×2): 20 mg via INTRAVENOUS
  Filled 2015-07-03 (×2): qty 2

## 2015-07-03 MED ORDER — ACETAMINOPHEN 325 MG PO TABS: 650.0000 mg | ORAL_TABLET | ORAL | Status: DC | PRN

## 2015-07-03 MED ORDER — ASPIRIN EC 81 MG PO TBEC: 81.0000 mg | DELAYED_RELEASE_TABLET | Freq: Every day | ORAL | Status: DC

## 2015-07-03 MED ORDER — SODIUM CHLORIDE 0.9 % IJ SOLN: 3.0000 mL | INTRAMUSCULAR | Status: DC | PRN

## 2015-07-03 MED ORDER — METOPROLOL TARTRATE 25 MG PO TABS
75.0000 mg | ORAL_TABLET | Freq: Two times a day (BID) | ORAL | Status: DC
  Administered 2015-07-03 – 2015-07-05 (×4): 75 mg via ORAL
  Filled 2015-07-03 (×4): qty 3

## 2015-07-03 MED ORDER — HYDRALAZINE HCL 50 MG PO TABS
50.0000 mg | ORAL_TABLET | Freq: Three times a day (TID) | ORAL | Status: DC
  Administered 2015-07-03 – 2015-07-05 (×6): 50 mg via ORAL
  Filled 2015-07-03 (×6): qty 1

## 2015-07-03 MED ORDER — FUROSEMIDE 10 MG/ML IJ SOLN
40.0000 mg | Freq: Once | INTRAMUSCULAR | Status: AC
Start: 2015-07-03 — End: 2015-07-03
  Administered 2015-07-03: 40 mg via INTRAVENOUS
  Filled 2015-07-03: qty 4

## 2015-07-03 MED ORDER — SODIUM CHLORIDE 0.9 % IJ SOLN
3.0000 mL | Freq: Two times a day (BID) | INTRAMUSCULAR | Status: DC
  Administered 2015-07-03 – 2015-07-05 (×4): 3 mL via INTRAVENOUS

## 2015-07-03 MED ORDER — INSULIN ASPART 100 UNIT/ML ~~LOC~~ SOLN
0.0000 [IU] | Freq: Three times a day (TID) | SUBCUTANEOUS | Status: DC
  Administered 2015-07-03 (×2): 3 [IU] via SUBCUTANEOUS
  Administered 2015-07-04 (×2): 5 [IU] via SUBCUTANEOUS
  Filled 2015-07-03 (×2): qty 5
  Filled 2015-07-03 (×2): qty 3

## 2015-07-03 MED ORDER — INSULIN ASPART 100 UNIT/ML ~~LOC~~ SOLN
8.0000 [IU] | Freq: Three times a day (TID) | SUBCUTANEOUS | Status: DC
  Administered 2015-07-04 (×3): 8 [IU] via SUBCUTANEOUS
  Filled 2015-07-03 (×4): qty 8

## 2015-07-03 MED ORDER — CLOPIDOGREL BISULFATE 75 MG PO TABS: 75.0000 mg | ORAL_TABLET | Freq: Every day | ORAL | Status: DC

## 2015-07-03 MED ORDER — ONDANSETRON HCL 4 MG/2ML IJ SOLN: 4.0000 mg | Freq: Four times a day (QID) | INTRAMUSCULAR | Status: DC | PRN

## 2015-07-03 MED ORDER — ENOXAPARIN SODIUM 40 MG/0.4ML ~~LOC~~ SOLN: 40.0000 mg | SUBCUTANEOUS | Status: DC

## 2015-07-03 MED ORDER — SODIUM CHLORIDE 0.9 % IV SOLN: 250.0000 mL | INTRAVENOUS | Status: DC | PRN

## 2015-07-03 MED ORDER — ATORVASTATIN CALCIUM 20 MG PO TABS
40.0000 mg | ORAL_TABLET | Freq: Every day | ORAL | Status: DC
  Administered 2015-07-03 – 2015-07-04 (×2): 40 mg via ORAL
  Filled 2015-07-03 (×2): qty 2

## 2015-07-03 MED ORDER — ISOSORBIDE MONONITRATE ER 30 MG PO TB24
30.0000 mg | ORAL_TABLET | Freq: Two times a day (BID) | ORAL | Status: DC
  Administered 2015-07-03 – 2015-07-05 (×4): 30 mg via ORAL
  Filled 2015-07-03 (×4): qty 1

## 2015-07-03 MED ORDER — INSULIN GLARGINE 100 UNIT/ML ~~LOC~~ SOLN
20.0000 [IU] | Freq: Every day | SUBCUTANEOUS | Status: DC
  Administered 2015-07-03 – 2015-07-04 (×2): 20 [IU] via SUBCUTANEOUS
  Filled 2015-07-03 (×3): qty 0.2

## 2015-07-03 MED ORDER — ASPIRIN EC 81 MG PO TBEC
81.0000 mg | DELAYED_RELEASE_TABLET | Freq: Every day | ORAL | Status: DC
  Administered 2015-07-03 – 2015-07-05 (×3): 81 mg via ORAL
  Filled 2015-07-03 (×3): qty 1

## 2015-07-03 MED ORDER — CLOPIDOGREL BISULFATE 75 MG PO TABS
75.0000 mg | ORAL_TABLET | Freq: Every day | ORAL | Status: DC
  Administered 2015-07-04 – 2015-07-05 (×2): 75 mg via ORAL
  Filled 2015-07-03 (×2): qty 1

## 2015-07-03 MED ORDER — NITROGLYCERIN 0.4 MG SL SUBL
0.4000 mg | SUBLINGUAL_TABLET | Freq: Once | SUBLINGUAL | Status: AC
  Administered 2015-07-03: 0.4 mg via SUBLINGUAL
  Filled 2015-07-03: qty 1

## 2015-07-03 NOTE — ED Provider Notes (Signed)
[facility] Emergency Department Provider Note  ____________________________________________  Time seen: 8:30 AM  I have reviewed the triage vital signs and the nursing notes.   HISTORY  Chief Complaint Shortness of Breath    HPI Theresa Rogers is a 65 y.o. female who complains of worsening shortness of breath, peripheral edema for the past week. Gradual in onset and progression. She also reports orthopnea and is now sleeping on 3 pillows where she is to sleep flat on her back. She also has paroxysmal nocturnal dyspnea and dyspnea on exertion.Denies chest pain. No cough or recent illness. No trauma. No abdominal pain  Does not use oxygen at home. Compliant with medical therapy   Past Medical History  Diagnosis Date  . CAD (coronary artery disease)     a. s/p MI/syncope in 2009 s/p remote stenting; b. NSTEMI 06/2015, cardiac cath 06/2015 pLAD 70% FFR 0.88 not clinically sig, dLAD 90%, mLCx 80% s/p PCI/DES 0%, pRCA 70%, mRCA 80%, dRCA 80%  . Diabetes mellitus without complication ([place])   . Ischemic cardiomyopathy     a. echo 06/2015: EF 30-35%, diffuse HK, nable to exclude RWMA, LV diastolic function parameters were normal, mild MR, left atrium was mildly dilated, PASP 28 mm Hg, IVC was dilated c/w elevated CVP  . Chronic systolic CHF (congestive heart failure) ([place])   . PAF (paroxysmal atrial fibrillation) ([place])     a. new onset 06/2015; b. CHADS2VASc = 5 (CHF, HTN, age x 1, vascular dz, female); c. not on long term full dose anticoagulation until 07/2015 in an effort to avoid triple therapy given her anemia   . CKD (chronic kidney disease), stage III   . Anemia   . HTN (hypertension)   . HLD (hyperlipidemia)      Patient Active Problem List   Diagnosis Date Noted  . HLD (hyperlipidemia)   . HTN (hypertension)   . Anemia   . CKD (chronic kidney disease), stage III   . PAF (paroxysmal atrial fibrillation) ([place])   . Chronic systolic CHF (congestive heart  failure) ([place])   . Ischemic cardiomyopathy   . CAD (coronary artery disease)   . Community acquired pneumonia   . NSTEMI (non-ST elevated myocardial infarction) ([place])   . Congestive dilated cardiomyopathy ([place])   . Coronary artery disease involving native coronary artery of native heart without angina pectoris   . Acute renal failure with tubular necrosis ([place])   . Pleural effusion      Past Surgical History  Procedure Laterality Date  . Coronary angioplasty with stent placement    . Cardiac catheterization N/A 06/22/2015    Procedure: Coronary Angiogram;  Surgeon: Minna Merritts, MD;  Location: [place] CV LAB;  Service: Cardiovascular;  Laterality: N/A;  . Cardiac catheterization N/A 06/26/2015    Procedure: Coronary Stent Intervention;  Surgeon: Wellington Hampshire, MD;  Location: [place] CV LAB;  Service: Cardiovascular;  Laterality: N/A;     Current Outpatient Rx  Name  Route  Sig  Dispense  Refill  . aspirin EC 81 MG EC tablet   Oral   Take 1 tablet (81 mg total) by mouth daily.         Marland Kitchen atorvastatin (LIPITOR) 40 MG tablet   Oral   Take 1 tablet (40 mg total) by mouth daily at 6 PM.   30 tablet   0   . clopidogrel (PLAVIX) 75 MG tablet   Oral   Take 1 tablet (75 mg total) by mouth  daily.   30 tablet   0   . hydrALAZINE (APRESOLINE) 50 MG tablet   Oral   Take 1 tablet (50 mg total) by mouth 3 (three) times daily.   90 tablet   3   . insulin glargine (LANTUS) 100 UNIT/ML injection   Subcutaneous   Inject 20 Units into the skin at bedtime.         . insulin lispro (HUMALOG) 100 UNIT/ML injection   Subcutaneous   Inject 8-12 Units into the skin 3 (three) times daily before meals.         . isosorbide mononitrate (IMDUR) 30 MG 24 hr tablet   Oral   Take 1 tablet (30 mg total) by mouth 2 (two) times daily.   60 tablet   0   . levofloxacin (LEVAQUIN) 250 MG tablet   Oral   Take 1 tablet (250 mg total) by mouth daily.   2 tablet   0   .  Metoprolol Tartrate 75 MG TABS   Oral   Take 75 mg by mouth 2 (two) times daily.   60 tablet   0      Allergies Review of patient's allergies indicates no known allergies.   Family History  Problem Relation Age of Onset  . CAD Mother   . Stroke Father   . Diabetes Brother   . Kidney failure Brother     Social History Social History  Substance Use Topics  . Smoking status: Never Smoker   . Smokeless tobacco: None  . Alcohol Use: No    Review of Systems  Constitutional:   No fever or chills. No weight changes Eyes:   No blurry vision or double vision.  ENT:   No sore throat. Cardiovascular:   No chest pain. Respiratory:   Positive shortness of breath without cough. Gastrointestinal:   Negative for abdominal pain, vomiting and diarrhea.  No BRBPR or melena. Genitourinary:   Negative for dysuria, urinary retention, bloody urine, or difficulty urinating. Musculoskeletal:   Negative for back pain. Bilateral symmetric peripheral edema Skin:   Negative for rash. Neurological:   Negative for headaches, focal weakness or numbness. Psychiatric:  No anxiety or depression.   Endocrine:  No hot/cold intolerance, changes in energy, or sleep difficulty.  10-point ROS otherwise negative.  ____________________________________________   PHYSICAL EXAM:  VITAL SIGNS: ED Triage Vitals  Enc Vitals Group     BP 07/03/15 0826 147/81 mmHg     Pulse Rate 07/03/15 0826 102     Resp 07/03/15 0826 24     Temp 07/03/15 0826 97.7 F (36.5 C)     Temp Source 07/03/15 0826 Oral     SpO2 07/03/15 0912 94 %     Weight 07/03/15 0826 158 lb (71.668 kg)     Height 07/03/15 0826 5\' 4"  (1.626 m)     Head Cir --      Peak Flow --      Pain Score --      Pain Loc --      Pain Edu? --      Excl. in [place]? --     Vital signs reviewed, nursing assessments reviewed.   Constitutional:   Alert and oriented. Mild respiratory distress. Eyes:   No scleral icterus. No conjunctival pallor. PERRL.  EOMI ENT   Head:   Normocephalic and atraumatic.   Nose:   No congestion/rhinnorhea. No septal hematoma   Mouth/Throat:   MMM, no pharyngeal erythema. No peritonsillar mass. No  uvula shift.   Neck:   No stridor. No SubQ emphysema. No meningismus. Unable to assess for JVD due to redundant skin and soft tissue Hematological/Lymphatic/Immunilogical:   No cervical lymphadenopathy. Cardiovascular:   RRR. Normal and symmetric distal pulses are present in all extremities. No murmurs, rubs, or gallops. Respiratory:   Tachypnea and increased work of breathing with accessory muscle use. Patient speaks in short sentences. Breath sounds diminished at bilateral bases with rales in the mid lung fields. No focal consolidative findings.. Gastrointestinal:   Soft and nontender. No distention. There is no CVA tenderness.  No rebound, rigidity, or guarding. Genitourinary:   deferred Musculoskeletal:   Nontender with normal range of motion in all extremities. No joint effusions.  No lower extremity tenderness.  2+ pitting edema bilaterally.. Neurologic:   Normal speech and language.  CN 2-10 normal. Motor grossly intact. No gross focal neurologic deficits are appreciated.  Skin:    Skin is warm, dry and intact. No rash noted.  No petechiae, purpura, or bullae. Psychiatric:   Mood and affect are normal. Speech and behavior are normal. Patient exhibits appropriate insight and judgment.  ____________________________________________    LABS (pertinent positives/negatives) (all labs ordered are listed, but only abnormal results are displayed) Labs Reviewed  BASIC METABOLIC PANEL - Abnormal; Notable for the following:    Glucose, Bld 269 (*)    BUN 32 (*)    Creatinine, Ser 1.52 (*)    Calcium 8.6 (*)    GFR calc non Af Amer 35 (*)    GFR calc Af Amer 41 (*)    All other components within normal limits  TROPONIN I - Abnormal; Notable for the following:    Troponin I 1.45 (*)    All other  components within normal limits  CBC - Abnormal; Notable for the following:    RBC 3.36 (*)    Hemoglobin 9.7 (*)    HCT 29.1 (*)    All other components within normal limits   ____________________________________________   EKG  Interpreted by me Sinus tachycardia rate 101, normal axis and intervals. Poor R-wave progression in anterior precordial leads. Normal ST segments, T-wave inversion in lead 3 and V6 which is nonspecific, EKG is unchanged from June 27 2015 and previous.  ____________________________________________    RADIOLOGY  Chest x-ray shows pulmonary edema and bilateral pleural effusions consistent with CHF  ____________________________________________   PROCEDURES   ____________________________________________   INITIAL IMPRESSION / ASSESSMENT AND PLAN / ED COURSE  Pertinent labs & imaging results that were available during my care of the patient were reviewed by me and considered in my medical decision making (see chart for details).  Patient presents with CHF exacerbation clinically. Notably she has recently suffered an N STEMI and was hospitalized about 2 weeks ago for this. Her troponin is elevated at 1.45 which is actually decreased from 2.92 weeks ago. With the interval development of worsening CHF syndrome afterward, I'm concerned that she is having worsening ischemic cardiomyopathy. I've given her IV Lasix in the emergency department and think that she needs further inpatient care for diuresis and monitoring of fluid status and cardiac function. I'll discuss with the hospitalist for admission. Low suspicion for PE or ACS at this time. No evidence of sepsis     ____________________________________________   FINAL CLINICAL IMPRESSION(S) / ED DIAGNOSES  Final diagnoses:  Acute on chronic systolic congestive heart failure ([place])      Carrie Mew, MD 07/03/15 1007

## 2015-07-03 NOTE — ED Notes (Signed)
Pt given lunch tray; no needs expressed at this time

## 2015-07-03 NOTE — H&P (Signed)
[facility] [place] NAME: Theresa Rogers    MR#:  [id number]  DATE OF BIRTH:  September 15, 1950  DATE OF ADMISSION:  07/03/2015  PRIMARY CARE PHYSICIAN: Pcp Not In System   REQUESTING/REFERRING PHYSICIAN: Dr. Joni Fears  CHIEF COMPLAINT:  Increasing shortness of breath and leg swelling for 2-3 days  HISTORY OF PRESENT ILLNESS:  Theresa Rogers  is a 65 y.o. female with a known history of coronary artery disease status post stent placement in left circumflex on 06/22/2015, type 2 diabetes, A. fib with RVR, hypertension, cardiac myopathy with EF of 30-35%, chronic kidney disease stage III, comes to the emergency room with increasing shortness of breath which started yesterday got worse and today where patient was not able to lay flat and still feel comfortable. She also noticed significant leg edema which is new for her. Patient was found to have congestive heart failure in the ER for workup. She received 40 g of IV Lasix. Her chest x-ray shows pulmonary vascular congestion consistent with congestive heart failure acute systolic given EF of [id number] she is being admitted for further rash and management.  PAST MEDICAL HISTORY:   Past Medical History  Diagnosis Date  . CAD (coronary artery disease)     a. s/p MI/syncope in 2009 s/p remote stenting; b. NSTEMI 06/2015, cardiac cath 06/2015 pLAD 70% FFR 0.88 not clinically sig, dLAD 90%, mLCx 80% s/p PCI/DES 0%, pRCA 70%, mRCA 80%, dRCA 80%  . Diabetes mellitus without complication ([place])   . Ischemic cardiomyopathy     a. echo 06/2015: EF 30-35%, diffuse HK, nable to exclude RWMA, LV diastolic function parameters were normal, mild MR, left atrium was mildly dilated, PASP 28 mm Hg, IVC was dilated c/w elevated CVP  . Chronic systolic CHF (congestive heart failure) ([place])   . PAF (paroxysmal atrial fibrillation) ([place])     a. new onset 06/2015; b. CHADS2VASc = 5 (CHF, HTN, age x 1, vascular dz, female); c. not  on long term full dose anticoagulation until 07/2015 in an effort to avoid triple therapy given her anemia   . CKD (chronic kidney disease), stage III   . Anemia   . HTN (hypertension)   . HLD (hyperlipidemia)     PAST SURGICAL HISTOIRY:   Past Surgical History  Procedure Laterality Date  . Coronary angioplasty with stent placement    . Cardiac catheterization N/A 06/22/2015    Procedure: Coronary Angiogram;  Surgeon: Minna Merritts, MD;  Location: [facility];  Service: Cardiovascular;  Laterality: N/A;  . Cardiac catheterization N/A 06/26/2015    Procedure: Coronary Stent Intervention;  Surgeon: Wellington Hampshire, MD;  Location: [place];  Service: Cardiovascular;  Laterality: N/A;    SOCIAL HISTORY:   Social History  Substance Use Topics  . Smoking status: Never Smoker   . Smokeless tobacco: Not on file  . Alcohol Use: No    FAMILY HISTORY:   Family History  Problem Relation Age of Onset  . CAD Mother   . Stroke Father   . Diabetes Brother   . Kidney failure Brother     DRUG ALLERGIES:  No Known Allergies  REVIEW OF SYSTEMS:  Review of Systems  Constitutional: Negative for fever, chills and weight loss.  HENT: Negative for ear discharge, ear pain and nosebleeds.   Eyes: Negative for blurred vision, pain and discharge.  Respiratory: Positive for shortness of breath. Negative for sputum production, wheezing and stridor.  Cardiovascular: Positive for orthopnea, leg swelling and PND. Negative for chest pain and palpitations.  Gastrointestinal: Negative for nausea, vomiting, abdominal pain and diarrhea.  Genitourinary: Negative for urgency and frequency.  Musculoskeletal: Negative for back pain and joint pain.  Neurological: Positive for weakness. Negative for sensory change, speech change and focal weakness.  Psychiatric/Behavioral: Negative for depression and hallucinations. The patient is not nervous/anxious.      MEDICATIONS AT HOME:    Prior to Admission medications   Medication Sig Start Date End Date Taking? Authorizing Provider  aspirin EC 81 MG EC tablet Take 1 tablet (81 mg total) by mouth daily. 06/27/15  Yes Srikar Sudini, MD  atorvastatin (LIPITOR) 40 MG tablet Take 1 tablet (40 mg total) by mouth daily at 6 PM. 06/27/15  Yes Srikar Sudini, MD  clopidogrel (PLAVIX) 75 MG tablet Take 1 tablet (75 mg total) by mouth daily. 06/27/15  Yes Srikar Sudini, MD  hydrALAZINE (APRESOLINE) 50 MG tablet Take 1 tablet (50 mg total) by mouth 3 (three) times daily. 06/28/15  Yes Ryan M Dunn, PA-C  insulin glargine (LANTUS) 100 UNIT/ML injection Inject 20 Units into the skin at bedtime.   Yes Historical Provider, MD  insulin lispro (HUMALOG) 100 UNIT/ML injection Inject 8-12 Units into the skin 3 (three) times daily before meals.   Yes Historical Provider, MD  isosorbide mononitrate (IMDUR) 30 MG 24 hr tablet Take 1 tablet (30 mg total) by mouth 2 (two) times daily. 06/27/15  Yes Srikar Sudini, MD  Metoprolol Tartrate 75 MG TABS Take 75 mg by mouth 2 (two) times daily. 06/27/15  Yes Srikar Sudini, MD  levofloxacin (LEVAQUIN) 250 MG tablet Take 1 tablet (250 mg total) by mouth daily. Patient not taking: Reported on 07/03/2015 06/27/15   Hillary Bow, MD      VITAL SIGNS:  Blood pressure 140/75, pulse 100, temperature 97.7 F (36.5 C), temperature source Oral, resp. rate 24, height 5\' 4"  (1.626 m), weight 71.668 kg (158 lb), SpO2 95 %.  PHYSICAL EXAMINATION:  GENERAL:  65 y.o.-year-old patient lying in the bed with mild acute distress.  EYES: Pupils equal, round, reactive to light and accommodation. No scleral icterus. Extraocular muscles intact.  HEENT: Head atraumatic, normocephalic. Oropharynx and nasopharynx clear.  NECK:  Supple, no jugular venous distention. No thyroid enlargement, no tenderness.  LUNGS: Decreased breath sounds bilaterally, no wheezing, rales,rhonchi or crepitation. No use of accessory muscles of respiration.   CARDIOVASCULAR: S1, S2 normal. No murmurs, rubs, or gallops.  ABDOMEN: Soft, nontender, nondistended. Bowel sounds present. No organomegaly or mass.  EXTREMITIES: 3+ pedal edema, no cyanosis, or clubbing.  NEUROLOGIC: Cranial nerves II through XII are intact. Muscle strength 5/5 in all extremities. Sensation intact. Gait not checked.  PSYCHIATRIC: The patient is alert and oriented x 3.  SKIN: No obvious rash, lesion, or ulcer.   LABORATORY PANEL:   CBC  Recent Labs Lab 07/03/15 0833  WBC 8.7  HGB 9.7*  HCT 29.1*  PLT 363   ------------------------------------------------------------------------------------------------------------------  Chemistries   Recent Labs Lab 07/03/15 0833  NA 138  K 4.1  CL 108  CO2 22  GLUCOSE 269*  BUN 32*  CREATININE 1.52*  CALCIUM 8.6*   ------------------------------------------------------------------------------------------------------------------  Cardiac Enzymes  Recent Labs Lab 07/03/15 0833  TROPONINI 1.45*   ------------------------------------------------------------------------------------------------------------------  RADIOLOGY:  Dg Chest 2 View  07/03/2015  CLINICAL DATA:  Abdominal and lower extremity swelling associated with shortness of breath; recently treated for pneumonia and recent placement of a cardiac stent ; history  of cardiomyopathy with CHF, chronic renal insufficiency. EXAM: CHEST  2 VIEW COMPARISON:  Portable chest x-ray of June 19, 2015 FINDINGS: The lungs remain hypoinflated. There are persistent moderate-sized bilateral pleural effusions. The pulmonary interstitial markings remain increased and are not greatly changed. Infiltrate in the right upper lobe has improved somewhat but has not cleared entirely. The mediastinum is normal in width. The bony thorax exhibits no acute abnormality. There is calcification of the anterior longitudinal ligament of the thoracic spine. IMPRESSION: CHF with pulmonary  interstitial edema and bilateral pleural effusions not greatly changed since the previous study. Persistent interstitial infiltrate in the right upper lobe which has improved slightly. Electronically Signed   By: David  Martinique M.D.   On: 07/03/2015 08:55    EKG:  Sinus tachycardia right axis deviation and nonspecific T-wave abnormality.  IMPRESSION AND PLAN:   Theresa Rogers  is a 65 y.o. female with a known history of coronary artery disease status post stent placement in left circumflex on 06/22/2015, type 2 diabetes, A. fib with RVR, hypertension, cardiac myopathy with EF of 30-35%, chronic kidney disease stage III, comes to the emergency room with increasing shortness of breath which started yesterday got worse and today where patient was not able to lay flat and still feel comfortable. She also noticed significant leg edema which is new for her.  1. Acute congestive heart failure, systolic, new onset -Known history of cardiomyopathy EF of 35% -Admit to telemetry -2 g sodium ADA 1800-calorie diet -IV Lasix 20 mg 3 times a day, monitor daily weight, I's and O's, creatinine Avoid nephrotoxins -Continue and/or hydralazine and beta blockers -Unable to give ACE inhibitor secondary to chronic kidney disease stage III -Cardiology consultation  2 elevated troponin appears due and ischemia in the setting of new onset congestive heart failure. -Patient recently 06/22/2015 had a stent placement in her left circumflex. Currently denies any chest pain -We'll continue aspirin and Plavix -Continue beta blockers.  3. Chronic kidney disease stage III Avoid nephrotoxins Monitor creatinine closely given IV Lasix for diuresis Nephrology consultation if needed  4. Type 2 diabetes Sliding scale insulin Continue home dose Lantus and insulin aspart  5. Coronary artery disease status post recent stent placement and left circumflex on 06/22/2015 Continue patient's cardiac meds Patient is not having  any chest pain at present.   All the records are reviewed and case discussed with ED provider. Management plans discussed with the patient, family and they are in agreement.  CODE STATUS: Full  TOTAL TIME TAKING CARE OF THIS PATIENT 50 minutes.    Jakwan Sally M.D on 07/03/2015 at 10:34 AM  Between 7am to 6pm - Pager - [phone]  After 6pm go to [email or web address] - password [email or web address] [place] [facility]  Office  [phone]  CC: Primary care physician; Pcp Not In System

## 2015-07-03 NOTE — ED Notes (Signed)
Report called to floor. Patient will take her dinner tray with her to floor.

## 2015-07-04 ENCOUNTER — Inpatient Hospital Stay: Payer: Medicaid Other

## 2015-07-04 DIAGNOSIS — N183 Chronic kidney disease, stage 3 unspecified: Secondary | ICD-10-CM | POA: Insufficient documentation

## 2015-07-04 DIAGNOSIS — M7989 Other specified soft tissue disorders: Secondary | ICD-10-CM

## 2015-07-04 DIAGNOSIS — I209 Angina pectoris, unspecified: Secondary | ICD-10-CM

## 2015-07-04 DIAGNOSIS — I2510 Atherosclerotic heart disease of native coronary artery without angina pectoris: Secondary | ICD-10-CM

## 2015-07-04 DIAGNOSIS — I5023 Acute on chronic systolic (congestive) heart failure: Secondary | ICD-10-CM | POA: Insufficient documentation

## 2015-07-04 DIAGNOSIS — D649 Anemia, unspecified: Secondary | ICD-10-CM

## 2015-07-04 LAB — BASIC METABOLIC PANEL
Anion gap: 5 (ref 5–15)
BUN: 40 mg/dL — AB (ref 6–20)
CHLORIDE: 108 mmol/L (ref 101–111)
CO2: 25 mmol/L (ref 22–32)
Calcium: 8.2 mg/dL — ABNORMAL LOW (ref 8.9–10.3)
Creatinine, Ser: 1.75 mg/dL — ABNORMAL HIGH (ref 0.44–1.00)
GFR calc Af Amer: 34 mL/min — ABNORMAL LOW (ref >60)
GFR calc non Af Amer: 30 mL/min — ABNORMAL LOW (ref >60)
GLUCOSE: 280 mg/dL — AB (ref 65–99)
POTASSIUM: 4 mmol/L (ref 3.5–5.1)
Sodium: 138 mmol/L (ref 135–145)

## 2015-07-04 LAB — URINALYSIS COMPLETE WITH MICROSCOPIC (ARMC ONLY)
BILIRUBIN URINE: NEGATIVE
Bacteria, UA: NONE SEEN
Glucose, UA: 50 mg/dL — AB
Hgb urine dipstick: NEGATIVE
KETONES UR: NEGATIVE mg/dL
NITRITE: NEGATIVE
PH: 5 (ref 5.0–8.0)
PROTEIN: 100 mg/dL — AB
Specific Gravity, Urine: 1.014 (ref 1.005–1.030)

## 2015-07-04 LAB — CBC WITH DIFFERENTIAL/PLATELET
Basophils Absolute: 0.1 10*3/uL (ref 0–0.1)
Basophils Relative: 1 %
Eosinophils Absolute: 0.1 10*3/uL (ref 0–0.7)
Eosinophils Relative: 1 %
HCT: 27.5 % — ABNORMAL LOW (ref 35.0–47.0)
Hemoglobin: 9 g/dL — ABNORMAL LOW (ref 12.0–16.0)
Lymphocytes Relative: 13 %
Lymphs Abs: 1 10*3/uL (ref 1.0–3.6)
MCH: 28.8 pg (ref 26.0–34.0)
MCHC: 32.8 g/dL (ref 32.0–36.0)
MCV: 87.5 fL (ref 80.0–100.0)
Monocytes Absolute: 0.6 10*3/uL (ref 0.2–0.9)
Monocytes Relative: 7 %
Neutro Abs: 6.1 10*3/uL (ref 1.4–6.5)
Neutrophils Relative %: 78 %
Platelets: 325 10*3/uL (ref 150–440)
RBC: 3.14 MIL/uL — ABNORMAL LOW (ref 3.80–5.20)
RDW: 13.8 % (ref 11.5–14.5)
WBC: 7.9 10*3/uL (ref 3.6–11.0)

## 2015-07-04 LAB — GLUCOSE, CAPILLARY
GLUCOSE-CAPILLARY: 111 mg/dL — AB (ref 65–99)
GLUCOSE-CAPILLARY: 122 mg/dL — AB (ref 65–99)
Glucose-Capillary: 268 mg/dL — ABNORMAL HIGH (ref 65–99)
Glucose-Capillary: 274 mg/dL — ABNORMAL HIGH (ref 65–99)
Glucose-Capillary: 76 mg/dL (ref 65–99)

## 2015-07-04 LAB — PROTEIN / CREATININE RATIO, URINE
Creatinine, Urine: 180 mg/dL
Protein Creatinine Ratio: 1.17 mg/mg{creat} — ABNORMAL HIGH (ref 0.00–0.15)
Total Protein, Urine: 211 mg/dL

## 2015-07-04 LAB — TROPONIN I
Troponin I: 1.35 ng/mL — ABNORMAL HIGH
Troponin I: 1.45 ng/mL — ABNORMAL HIGH

## 2015-07-04 MED ORDER — FUROSEMIDE 10 MG/ML IJ SOLN
40.0000 mg | Freq: Two times a day (BID) | INTRAMUSCULAR | Status: DC
  Administered 2015-07-04 (×2): 40 mg via INTRAVENOUS
  Filled 2015-07-04 (×2): qty 4

## 2015-07-04 NOTE — Care Management Note (Addendum)
Case Management Note  Patient Details  Name: Theresa Rogers MRN: [id number] Date of Birth: June 27, 1950  Subjective/Objective:    RNCM assessment. Recently at [facility] 01/09-1/17 due to PNA and NSTEMI, afib.   Readmission 01/23 due to CHF. Spoke with patient. She states she has never received any applications for [facility] or [facility]ication [facility] at prior hospitalizations. She had an appointment with a cardiologist 01/23 but was admitted prior to that appointment. RNCM provided patient with new [facility] and [facility] applications. She did sign up at [facility] after last admission for their cash discount program  And had a 30 day supply of her medications filled.  She was given a good RX discount card on admission 1/23. Email sent to [name] at [facility] with patient contact information.             Action/Plan: Provided [facility] and [facility] applications  Expected Discharge Date:                  Expected Discharge Plan:     In-House Referral:     Discharge planning Services  CM Consult  Post Acute Care Choice:    Choice offered to:     DME Arranged:    DME Agency:     HH Arranged:    HH Agency:     Status of Service:  In process, will continue to follow  Medicare Important Message Given:    Date Medicare IM Given:    Medicare IM give by:    Date Additional Medicare IM Given:    Additional Medicare Important Message give by:     If discussed at [place] of Stay Meetings, dates discussed:    Additional Comments:  Jolly Mango, RN 07/04/2015, 3:32 PM

## 2015-07-04 NOTE — Progress Notes (Signed)
[facility] [place] NAME: Theresa Rogers    MR#:  [id number]  DATE OF BIRTH:  11/30/1950  SUBJECTIVE:   Patient is reporting that she feels 100 times better than she did on admission. She still has some very minimal lower extremity edema. She is not complaining shortness of breath or chest pain.  REVIEW OF SYSTEMS:    Review of Systems  Constitutional: Negative for fever, chills and malaise/fatigue.  HENT: Negative for sore throat.   Eyes: Negative for blurred vision.  Respiratory: Negative for cough, hemoptysis, shortness of breath and wheezing.   Cardiovascular: Positive for leg swelling. Negative for chest pain and palpitations.  Gastrointestinal: Negative for nausea, vomiting, abdominal pain, diarrhea and blood in stool.  Genitourinary: Negative for dysuria.  Musculoskeletal: Negative for back pain.  Neurological: Negative for dizziness, tremors and headaches.  Endo/Heme/Allergies: Does not bruise/bleed easily.    Tolerating Diet:yes      DRUG ALLERGIES:  No Known Allergies  VITALS:  Blood pressure 131/57, pulse 72, temperature 97.8 F (36.6 C), temperature source Oral, resp. rate 22, height 5\' 4"  (1.626 m), weight 77.338 kg (170 lb 8 oz), SpO2 93 %.  PHYSICAL EXAMINATION:   Physical Exam  Constitutional: She is oriented to person, place, and time and well-developed, well-nourished, and in no distress. No distress.  HENT:  Head: Normocephalic.  Eyes: No scleral icterus.  Neck: Normal range of motion. Neck supple. No JVD present. No tracheal deviation present.  Cardiovascular: Normal rate, regular rhythm and normal heart sounds.  Exam reveals no gallop and no friction rub.   No murmur heard. Pulmonary/Chest: Effort normal and breath sounds normal. No respiratory distress. She has no wheezes. She has no rales. She exhibits no tenderness.  Abdominal: Soft. Bowel sounds are normal. She exhibits no distension and no  mass. There is no tenderness. There is no rebound and no guarding.  Musculoskeletal: Normal range of motion. She exhibits edema.  Neurological: She is alert and oriented to person, place, and time.  Skin: Skin is warm. No rash noted. No erythema.  Psychiatric: Affect and judgment normal.      LABORATORY PANEL:   CBC  Recent Labs Lab 07/03/15 1740  WBC 9.0  HGB 9.9*  HCT 29.9*  PLT 356   ------------------------------------------------------------------------------------------------------------------  Chemistries   Recent Labs Lab 07/04/15 0520  NA 138  K 4.0  CL 108  CO2 25  GLUCOSE 280*  BUN 40*  CREATININE 1.75*  CALCIUM 8.2*   ------------------------------------------------------------------------------------------------------------------  Cardiac Enzymes  Recent Labs Lab 07/03/15 1740 07/04/15 0143 07/04/15 0520  TROPONINI 1.21* 1.35* 1.45*   ------------------------------------------------------------------------------------------------------------------  RADIOLOGY:  Dg Chest 2 View  07/03/2015  CLINICAL DATA:  Abdominal and lower extremity swelling associated with shortness of breath; recently treated for pneumonia and recent placement of a cardiac stent ; history of cardiomyopathy with CHF, chronic renal insufficiency. EXAM: CHEST  2 VIEW COMPARISON:  Portable chest x-ray of June 19, 2015 FINDINGS: The lungs remain hypoinflated. There are persistent moderate-sized bilateral pleural effusions. The pulmonary interstitial markings remain increased and are not greatly changed. Infiltrate in the right upper lobe has improved somewhat but has not cleared entirely. The mediastinum is normal in width. The bony thorax exhibits no acute abnormality. There is calcification of the anterior longitudinal ligament of the thoracic spine. IMPRESSION: CHF with pulmonary interstitial edema and bilateral pleural effusions not greatly changed since the previous study.  Persistent interstitial infiltrate in the right upper lobe  which has improved slightly. Electronically Signed   By: David  Martinique M.D.   On: 07/03/2015 08:55     ASSESSMENT AND PLAN:   65 year old female with a history of CAD most recent mid left circumflex stent in January 2017, ischemic cardiomyopathy EF of 30-35%, PAF and stage III chronic kidney disease who presented with CHF exacerbation.  1. Acute on chronic systolic congestive heart failure/ischemic cardio myopathy: Patient has a known ejection fraction of 30-35% with diffuse hypokinesis. Patient has improved with IV Lasix. Continue IV Lasix today and then change to by mouth tomorrow. She will need to be discharged on oral Lasix.  2.elevated troponin/ CAD: Patient's troponins are slowly increasing without chest pain. Patient had recent stent placed. This may be due to demand ischemia from problem #1. Cardiology recommendations are to follow. Continue aspirin, Plavix, atorvastatin and metoprolol. Full dose Lovenox for now. 3. Chronic kidney disease stage III due to hypertension and diabetes: Patient's creatinine is at baseline. Appreciate nephrology consultation. Hold nephrotoxic agents and continue to monitor creatinine.  4. Diabetes type 2 with chronic kidney disease: Hemoglobin A1c is 11.6 which is not controlled. Diabetes coordinator consult is placed. Continue Lantus and siding scale insulin. Continue ADA diet. Further recommendations after consultation.  5. Essential hypertension: Continue metoprolol and Imdur.   Management plans discussed with the patient and she is in agreement.  CODE STATUS: FULL  TOTAL TIME TAKING CARE OF THIS PATIENT: 30 minutes.     POSSIBLE D/C tomorrow, DEPENDING ON CLINICAL CONDITION.   Carmita Boom M.D on 07/04/2015 at 11:05 AM  Between 7am to 6pm - Pager - [phone] After 6pm go to [email or web address] - password [email or web address] [place] [facility]  Office  [phone]  CC: Primary care  physician; Pcp Not In System  Note: This dictation was prepared with Dragon dictation along with smaller phrase technology. Any transcriptional errors that result from this process are unintentional.

## 2015-07-04 NOTE — Consult Note (Signed)
[facility]  CONSULT NOTE    Date: 07/04/2015                  Patient Name:  Theresa Rogers  MRN: [id number]  DOB: 06/04/1951  Age / Sex: 65 y.o., female         PCP: Pcp Not In System                 Service Requesting Consult: Dr. Benjie Karvonen                 Reason for Consult: Chronic Kidney Disease stage III            History of Present Illness: [name] is a 65 y.o. white female with coronary artery disease, diabetes mellitus type II, congestive heart failure, atrial fibrillation, anemia, hypertension, hyperlipidemia, who was admitted to [facility] on 07/03/2015 for Acute on chronic systolic congestive heart failure ([place]) [I50.23]  Patient was recently admitted to [facility] from 1/9 - 1/17 for NSTEMI, pneumonia and atrial fibrillation with rapid ventricular response. She was not discharged with any diuretics.   On discharge creatinien of 1.57. Creatinine today of 1.75. Nephrology consulted for chronic kidney disease. Patient is unsure about having this diagnosis. She states she was told she was in renal failure more than ten years. back  Originally from [place], has not established with a primary doctor yet.    Medications: Outpatient medications: Prescriptions prior to admission  Medication Sig Dispense Refill Last Dose  . aspirin EC 81 MG EC tablet Take 1 tablet (81 mg total) by mouth daily.   07/02/2015 at Unknown time  . atorvastatin (LIPITOR) 40 MG tablet Take 1 tablet (40 mg total) by mouth daily at 6 PM. 30 tablet 0 07/02/2015 at Unknown time  . clopidogrel (PLAVIX) 75 MG tablet Take 1 tablet (75 mg total) by mouth daily. 30 tablet 0 07/02/2015 at Unknown time  . hydrALAZINE (APRESOLINE) 50 MG tablet Take 1 tablet (50 mg total) by mouth 3 (three) times daily. 90 tablet 3 07/02/2015 at Unknown time  . insulin glargine (LANTUS) 100 UNIT/ML injection Inject 20 Units into the skin at bedtime.   07/02/2015 at Unknown time  . insulin lispro (HUMALOG) 100 UNIT/ML  injection Inject 8-12 Units into the skin 3 (three) times daily before meals.   07/02/2015 at Unknown time  . isosorbide mononitrate (IMDUR) 30 MG 24 hr tablet Take 1 tablet (30 mg total) by mouth 2 (two) times daily. 60 tablet 0 07/02/2015 at Unknown time  . Metoprolol Tartrate 75 MG TABS Take 75 mg by mouth 2 (two) times daily. 60 tablet 0 07/02/2015 at Unknown time  . levofloxacin (LEVAQUIN) 250 MG tablet Take 1 tablet (250 mg total) by mouth daily. (Patient not taking: Reported on 07/03/2015) 2 tablet 0 Completed Course at Unknown time    Current medications: Current Facility-Administered Medications  Medication Dose Route Frequency Provider Last Rate Last Dose  . 0.9 %  sodium chloride infusion  250 mL Intravenous PRN Fritzi Mandes, MD      . acetaminophen (TYLENOL) tablet 650 mg  650 mg Oral Q4H PRN Fritzi Mandes, MD      . aspirin EC tablet 81 mg  81 mg Oral Daily Fritzi Mandes, MD   81 mg at 07/04/15 0908  . atorvastatin (LIPITOR) tablet 40 mg  40 mg Oral q1800 Fritzi Mandes, MD   40 mg at 07/03/15 1757  . clopidogrel (PLAVIX) tablet 75 mg  75 mg Oral  Daily Fritzi Mandes, MD   75 mg at 07/04/15 0908  . enoxaparin (LOVENOX) injection 40 mg  40 mg Subcutaneous Q24H Fritzi Mandes, MD   40 mg at 07/03/15 1752  . furosemide (LASIX) injection 40 mg  40 mg Intravenous BID Erma Heritage, [place]   40 mg at 07/04/15 [id number]  . hydrALAZINE (APRESOLINE) tablet 50 mg  50 mg Oral TID Fritzi Mandes, MD   50 mg at 07/04/15 0908  . insulin aspart (novoLOG) injection 0-9 Units  0-9 Units Subcutaneous TID WC Fritzi Mandes, MD   5 Units at 07/04/15 0909  . insulin aspart (novoLOG) injection 8 Units  8 Units Subcutaneous TID WC Fritzi Mandes, MD   8 Units at 07/04/15 0908  . insulin glargine (LANTUS) injection 20 Units  20 Units Subcutaneous QHS Fritzi Mandes, MD   20 Units at 07/03/15 2055  . isosorbide mononitrate (IMDUR) 24 hr tablet 30 mg  30 mg Oral BID Fritzi Mandes, MD   30 mg at 07/04/15 0908  . metoprolol tartrate (LOPRESSOR) tablet 75  mg  75 mg Oral BID Fritzi Mandes, MD   75 mg at 07/04/15 0908  . ondansetron (ZOFRAN) injection 4 mg  4 mg Intravenous Q6H PRN Fritzi Mandes, MD      . sodium chloride 0.9 % injection 3 mL  3 mL Intravenous Q12H Fritzi Mandes, MD   3 mL at 07/04/15 0908  . sodium chloride 0.9 % injection 3 mL  3 mL Intravenous PRN Fritzi Mandes, MD          Allergies: No Known Allergies    Past Medical History: Past Medical History  Diagnosis Date  . CAD (coronary artery disease)     a. s/p MI/syncope in 2009 s/p remote stenting; b. NSTEMI 06/2015, cardiac cath 06/2015 pLAD 70% FFR 0.88 not clinically sig, dLAD 90%, mLCx 80% s/p PCI/DES 0%, pRCA 70%, mRCA 80%, dRCA 80%  . Diabetes mellitus without complication ([place])   . Ischemic cardiomyopathy     a. echo 06/2015: EF 30-35%, diffuse HK, nable to exclude RWMA, LV diastolic function parameters were normal, mild MR, left atrium was mildly dilated, PASP 28 mm Hg, IVC was dilated c/w elevated CVP  . Chronic systolic CHF (congestive heart failure) ([place])   . PAF (paroxysmal atrial fibrillation) ([place])     a. new onset 06/2015; b. CHADS2VASc = 5 (CHF, HTN, age x 1, vascular dz, female); c. not on long term full dose anticoagulation until 07/2015 in an effort to avoid triple therapy given her anemia   . CKD (chronic kidney disease), stage III   . Anemia   . HTN (hypertension)   . HLD (hyperlipidemia)      Past Surgical History: Past Surgical History  Procedure Laterality Date  . Coronary angioplasty with stent placement    . Cardiac catheterization N/A 06/22/2015    Procedure: Coronary Angiogram;  Surgeon: Minna Merritts, MD;  Location: [facility];  Service: Cardiovascular;  Laterality: N/A;  . Cardiac catheterization N/A 06/26/2015    Procedure: Coronary Stent Intervention;  Surgeon: Wellington Hampshire, MD;  Location: [place] [facility];  Service: Cardiovascular;  Laterality: N/A;     Family History: Family History  Problem Relation Age of Onset  . CAD  Mother   . Stroke Father   . Diabetes Brother   . Kidney failure Brother      Social History: Social History   Social History  . Marital Status: Divorced    Spouse  Name: N/A  . Number of Children: N/A  . Years of Education: N/A   Occupational History  . Not on file.   Social History Main Topics  . Smoking status: Never Smoker   . Smokeless tobacco: Not on file  . Alcohol Use: No  . Drug Use: No  . Sexual Activity: Not on file   Other Topics Concern  . Not on file   Social History Narrative     Review of Systems: Review of Systems  Constitutional: Negative.  Negative for fever, chills, weight loss, malaise/fatigue and diaphoresis.  HENT: Negative.  Negative for congestion, ear discharge, ear pain, hearing loss, nosebleeds, sore throat and tinnitus.   Eyes: Negative.  Negative for blurred vision, double vision, photophobia, pain, discharge and redness.  Respiratory: Positive for cough, shortness of breath and wheezing. Negative for hemoptysis, sputum production and stridor.   Cardiovascular: Positive for palpitations, orthopnea, leg swelling and PND. Negative for chest pain and claudication.  Gastrointestinal: Negative.  Negative for heartburn, nausea, vomiting, abdominal pain, diarrhea, constipation, blood in stool and melena.  Genitourinary: Negative.  Negative for dysuria, urgency, frequency, hematuria and flank pain.  Musculoskeletal: Negative.  Negative for myalgias, back pain, joint pain, falls and neck pain.  Skin: Negative.  Negative for itching and rash.  Neurological: Negative for dizziness, tingling, tremors, sensory change, speech change, focal weakness, seizures, loss of consciousness, weakness and headaches.  Endo/Heme/Allergies: Negative.  Negative for environmental allergies and polydipsia. Does not bruise/bleed easily.  Psychiatric/Behavioral: Negative.  Negative for depression, suicidal ideas, hallucinations, memory loss and substance abuse. The patient  is not nervous/anxious and does not have insomnia.     Vital Signs: Blood pressure 131/57, pulse 72, temperature 97.8 F (36.6 C), temperature source Oral, resp. rate 22, height 5\' 4"  (1.626 m), weight 77.338 kg (170 lb 8 oz), SpO2 93 %.  Weight trends: Filed Weights   07/03/15 0826 07/03/15 1723 07/04/15 0412  Weight: 71.668 kg (158 lb) 77.701 kg (171 lb 4.8 oz) 77.338 kg (170 lb 8 oz)    Physical Exam: General: NAD, sitting up, breathing room air  Head: Normocephalic, atraumatic. Moist oral mucosal membranes  Eyes: Anicteric, PERRL  Neck: Supple, trachea midline  Lungs:  Bibasilar crackles  Heart: Regular rate and rhythm  Abdomen:  Soft, nontender,   Extremities: 1+ peripheral edema  Neurologic: Nonfocal, moving all four extremities  Skin: No lesions        Lab results: Basic Metabolic Panel:  Recent Labs Lab 07/03/15 0833 07/03/15 1740 07/04/15 0520  NA 138  --  138  K 4.1  --  4.0  CL 108  --  108  CO2 22  --  25  GLUCOSE 269*  --  280*  BUN 32*  --  40*  CREATININE 1.52* 1.40* 1.75*  CALCIUM 8.6*  --  8.2*    Liver Function Tests: No results for input(s): AST, ALT, ALKPHOS, BILITOT, PROT, ALBUMIN in the last 168 hours. No results for input(s): LIPASE, AMYLASE in the last 168 hours. No results for input(s): AMMONIA in the last 168 hours.  CBC:  Recent Labs Lab 07/03/15 0833 07/03/15 1740  WBC 8.7 9.0  HGB 9.7* 9.9*  HCT 29.1* 29.9*  MCV 86.5 86.4  PLT 363 356    Cardiac Enzymes:  Recent Labs Lab 07/03/15 0833 07/03/15 1419 07/03/15 1740 07/04/15 0143 07/04/15 0520  TROPONINI 1.45* 1.27* 1.21* 1.35* 1.45*    BNP: Invalid input(s): POCBNP  CBG:  Recent Labs Lab 06/27/15 1115  07/03/15 1307 07/03/15 1638 07/03/15 2043 07/04/15 0729  GLUCAP 176* 201* 231* 314* 268*    Microbiology: No results found for this or any previous visit.  Coagulation Studies: No results for input(s): LABPROT, INR in the last 72  hours.  Urinalysis: No results for input(s): COLORURINE, LABSPEC, PHURINE, GLUCOSEU, HGBUR, BILIRUBINUR, KETONESUR, PROTEINUR, UROBILINOGEN, NITRITE, LEUKOCYTESUR in the last 72 hours.  Invalid input(s): APPERANCEUR    Imaging: Dg Chest 2 View  07/03/2015  CLINICAL DATA:  Abdominal and lower extremity swelling associated with shortness of breath; recently treated for pneumonia and recent placement of a cardiac stent ; history of cardiomyopathy with CHF, chronic renal insufficiency. EXAM: CHEST  2 VIEW COMPARISON:  Portable chest x-ray of June 19, 2015 FINDINGS: The lungs remain hypoinflated. There are persistent moderate-sized bilateral pleural effusions. The pulmonary interstitial markings remain increased and are not greatly changed. Infiltrate in the right upper lobe has improved somewhat but has not cleared entirely. The mediastinum is normal in width. The bony thorax exhibits no acute abnormality. There is calcification of the anterior longitudinal ligament of the thoracic spine. IMPRESSION: CHF with pulmonary interstitial edema and bilateral pleural effusions not greatly changed since the previous study. Persistent interstitial infiltrate in the right upper lobe which has improved slightly. Electronically Signed   By: David  Martinique M.D.   On: 07/03/2015 08:55      Assessment & Plan: Ms. Kryslynn Luckenbill is a 65 y.o. white female with coronary artery disease, diabetes mellitus type II, congestive heart failure, atrial fibrillation, anemia, hypertension, hyperlipidemia, who was admitted to [facility] on 07/03/2015 for Acute on chronic systolic congestive heart failure ([place]) [I50.23]  1. Chronic kidney disease stage III: no urine studies available. Most likely this is a long standing problem due to hypertension and diabetes.  - Discussed the diagnosis of chronic kidney disease.  - Will check urine studies, SPEP/UPEP and renal ultrasound - not currently on an ACE-I/ARB. Patient reports a reaction to  lisinopril but does not recall what that is.   2. Hypertension and Acute exacerbation of systolic congestive heart failure with EF of 30-35% on echocardiogram from 06/19/2015. Blood pressure at goal.  - started on furosemide - clinical examination and respiratory status has improved significantly.  - hydralazine, imdur and metoprolol.  - low salt diet.   3. Diabetes mellitus type II with chronic kidney disease: insulin dependent. Hemoglobin A1c of 11.6% - not well controlled.   4. Hyperlipidemia: not well controlled with lipid panel from 06/21/15 - atorvastatin as outpatient.   LOS: 1 Adajah Cocking 1/24/201710:41 AM

## 2015-07-04 NOTE — Discharge Instructions (Signed)
Heart Failure Clinic appointment on July 20, 2015 at 10:30am with Darylene Price, [place]. Please call [phone] to reschedule.

## 2015-07-04 NOTE — Consult Note (Signed)
CARDIOLOGY CONSULT NOTE   Patient ID: Theresa Rogers MRN: [id number], DOB/AGE: November 11, 1950   Admit date: 07/03/2015 Date of Consult: 07/04/2015 Reason for Consult: CHF Exacerbation Physician Requesting Consult: Dr. Posey Pronto   Primary Physician: Pcp Not In System Primary Cardiologist: Dr. Fletcher Anon  HPI: Theresa Rogers is a 65 y.o. female with past medical history of CAD (PCI in 2009, NSTEMI w/ DES to Mid-LCx in 06/2015), Type 2 DM, Ischemic Cardiomyopathy (EF 30-35% by Echo 06/2015), PAF (new onset 06/2015), Stage 3 CKD, HTN, HLD, and Anemia who presented to [facility] on 07/03/2015 for worsening dyspnea and lower extremity edema.   The patient reports her weight went up over 10 pounds last admission (was 162 lbs on 06/20/2015, 169 lbs at time of discharge). After going home, she noticed worsening lower extremity edema. She informed the office of this and her Amlodipine was discontinued on 06/28/2015.  She reported her edema continued to worsen, even after elevating her legs for several hours with 3 pillows. She had worsening dyspnea with exertion and orthopnea as well, having to sleep in her recliner. She had not been weighing herself daily at home but said her edema was significant enough she had difficulty bending her legs. She was scheduled for an outpatient appointment with [facility] yesterday but with her worsening symptoms, she decided to go to the ED.  While in the ED, her WBC was normal at 8.7. Hgb stable at 9.7. Troponin elevated to 1.27 (trending down from 2.94 on 06/19/2015). Creatinine 1.40 on admission, elevated to 1.75 this morning. CXR shows CHF with pulmonary interstitial edema and bilateral pleural effusions not greatly changed since the previous study. Persistent interstitial infiltrate in the right upper lobe which has improved slightly is also present.  She was given IV Lasix 40mg  once in the ED along with being started on Lasix 20mg  TID (having received two doses so far). At the time  of her discharge on 06/27/2015, her weight was 169 lbs. Her weight at time of admission was 171 lbs. She reports her legs feel significantly less tight compared to when she arrived to the ED. Reported going to the restroom in the ED several times, yet only 55mL has been reported as her output.   Problem List Past Medical History  Diagnosis Date  . CAD (coronary artery disease)     a. s/p MI/syncope in 2009 s/p remote stenting; b. NSTEMI 06/2015, cardiac cath 06/2015 pLAD 70% FFR 0.88 not clinically sig, dLAD 90%, mLCx 80% s/p PCI/DES 0%, pRCA 70%, mRCA 80%, dRCA 80%  . Diabetes mellitus without complication ([place])   . Ischemic cardiomyopathy     a. echo 06/2015: EF 30-35%, diffuse HK, nable to exclude RWMA, LV diastolic function parameters were normal, mild MR, left atrium was mildly dilated, PASP 28 mm Hg, IVC was dilated c/w elevated CVP  . Chronic systolic CHF (congestive heart failure) ([place])   . PAF (paroxysmal atrial fibrillation) ([place])     a. new onset 06/2015; b. CHADS2VASc = 5 (CHF, HTN, age x 1, vascular dz, female); c. not on long term full dose anticoagulation until 07/2015 in an effort to avoid triple therapy given her anemia   . CKD (chronic kidney disease), stage III   . Anemia   . HTN (hypertension)   . HLD (hyperlipidemia)     Past Surgical History  Procedure Laterality Date  . Coronary angioplasty with stent placement    . Cardiac catheterization N/A 06/22/2015    Procedure: Coronary Angiogram;  Surgeon: Christia Reading  Gloriajean Dell, MD;  Location: [place] [facility];  Service: Cardiovascular;  Laterality: N/A;  . Cardiac catheterization N/A 06/26/2015    Procedure: Coronary Stent Intervention;  Surgeon: Wellington Hampshire, MD;  Location: [place] [facility];  Service: Cardiovascular;  Laterality: N/A;     Allergies No Known Allergies    Inpatient Medications . aspirin EC  81 mg Oral Daily  . atorvastatin  40 mg Oral q1800  . clopidogrel  75 mg Oral Daily  . enoxaparin (LOVENOX)  injection  40 mg Subcutaneous Q24H  . furosemide  20 mg Intravenous TID  . hydrALAZINE  50 mg Oral TID  . insulin aspart  0-9 Units Subcutaneous TID WC  . insulin aspart  8 Units Subcutaneous TID WC  . insulin glargine  20 Units Subcutaneous QHS  . isosorbide mononitrate  30 mg Oral BID  . metoprolol tartrate  75 mg Oral BID  . sodium chloride  3 mL Intravenous Q12H    Family History Family History  Problem Relation Age of Onset  . CAD Mother   . Stroke Father   . Diabetes Brother   . Kidney failure Brother      Social History Social History   Social History  . Marital Status: Divorced    Spouse Name: N/A  . Number of Children: N/A  . Years of Education: N/A   Occupational History  . Not on file.   Social History Main Topics  . Smoking status: Never Smoker   . Smokeless tobacco: Not on file  . Alcohol Use: No  . Drug Use: No  . Sexual Activity: Not on file   Other Topics Concern  . Not on file   Social History Narrative     Review of Systems General:  No chills, fever, night sweats or weight changes.  Cardiovascular:  No chest pain, palpitations, paroxysmal nocturnal dyspnea. Positive for dyspnea on exertion, orthopnea and edema. Dermatological: No rash, lesions/masses Respiratory: No cough, Positive for dyspnea Urologic: No hematuria, dysuria Abdominal:   No nausea, vomiting, diarrhea, bright red blood per rectum, melena, or hematemesis Neurologic:  No visual changes, wkns, changes in mental status. All other systems reviewed and are otherwise negative except as noted above.  Physical Exam Blood pressure 116/55, pulse 64, temperature 97.8 F (36.6 C), temperature source Oral, resp. rate 22, height 5\' 4"  (1.626 m), weight 170 lb 8 oz (77.338 kg), SpO2 94 %.  General: Pleasant, Caucasian female appearing in NAD. Psych: Normal affect. Neuro: Alert and oriented X 3. Moves all extremities spontaneously. HEENT: Normal  Neck: Supple without bruits or  JVD. Lungs:  Resp regular and unlabored, Minimal rales at bases bilaterally. No wheezing appreciated. Heart: RRR no s3, s4, or murmurs. Abdomen: Soft, non-tender, non-distended, BS + x 4.  Extremities: No clubbing or cyanosis.  1+ edema bilaterally up to mid-shins. DP/PT/Radials 2+ and equal bilaterally.  Labs  Recent Labs  07/03/15 1419 07/03/15 1740 07/04/15 0143 07/04/15 0520  TROPONINI 1.27* 1.21* 1.35* 1.45*   Lab Results  Component Value Date   WBC 9.0 07/03/2015   HGB 9.9* 07/03/2015   HCT 29.9* 07/03/2015   MCV 86.4 07/03/2015   PLT 356 07/03/2015    Recent Labs Lab 07/04/15 0520  NA 138  K 4.0  CL 108  CO2 25  BUN 40*  CREATININE 1.75*  CALCIUM 8.2*  GLUCOSE 280*   Lab Results  Component Value Date   CHOL 222* 06/21/2015   HDL 41 06/21/2015   LDLCALC  149* 06/21/2015   TRIG 162* 06/21/2015    Radiology/Studies Dg Chest 2 View: 07/03/2015  CLINICAL DATA:  Abdominal and lower extremity swelling associated with shortness of breath; recently treated for pneumonia and recent placement of a cardiac stent ; history of cardiomyopathy with CHF, chronic renal insufficiency. EXAM: CHEST  2 VIEW COMPARISON:  Portable chest x-ray of June 19, 2015 FINDINGS: The lungs remain hypoinflated. There are persistent moderate-sized bilateral pleural effusions. The pulmonary interstitial markings remain increased and are not greatly changed. Infiltrate in the right upper lobe has improved somewhat but has not cleared entirely. The mediastinum is normal in width. The bony thorax exhibits no acute abnormality. There is calcification of the anterior longitudinal ligament of the thoracic spine. IMPRESSION: CHF with pulmonary interstitial edema and bilateral pleural effusions not greatly changed since the previous study. Persistent interstitial infiltrate in the right upper lobe which has improved slightly. Electronically Signed   By: David  Martinique M.D.   On: 07/03/2015 08:55    ECG: Has  not been obtained this admission.  ECHOCARDIOGRAM: 06/19/2015 Study Conclusions - Left ventricle: The cavity size was mildly dilated. Systolic function was moderately to severely reduced. The estimated ejection fraction was in the range of 30% to 35%. Diffuse hypokinesis. Regional wall motion abnormalities cannot be excluded. Left ventricular diastolic function parameters were normal. - Mitral valve: There was mild regurgitation. - Left atrium: The atrium was mildly dilated. - Right ventricle: Systolic function was normal. - Pulmonary arteries: PA peak pressure: 28 mm Hg (S). - Inferior vena cava: The vessel was dilated. The respirophasic diameter changes were blunted (< 50%), consistent with elevated central venous pressure.  Impressions: - Left pleural effusion noted. Rhythm is normal sinus.  ASSESSMENT AND PLAN  1. Acute on Chronic Systolic CHF/ Ischemic Cardiomyopathy - Recent echo on 06/19/2015 showed EF of 30-35% with diffuse hypokinesis. - presented with worsening lower extremity edema, orthopnea, and dyspnea for the past several days. - reports significant improvement in her symptoms following administration of IV Lasix 40mg  (even though recorded output is inaccurate).  - will increase morning IV Lasix from 20mg  to 40mg . Can hopefully switch to PO tomorrow. - would likely benefit from standing PO Lasix at home or at least PRN dosage with instructions to take for weight increase.  2. CAD - PCI in 2009, recent NSTEMI w/ DES to Mid-LCx on 06/26/2015 - denies any recent episodes of chest pain. - troponin values were 2.94 on 06/19/2015. Trending down to 1.27 on admission. - continue ASA, statin, Plavix, Imdur, and BB - will obtain 12-lead EKG  3. PAF - has been in NSR this admission.  - This patients CHA2DS2-VASc Score and unadjusted Ischemic Stroke Rate (% per year) is equal to 7.2 % stroke rate/year from a score of 5 (CHF, HTN, DM, Vascular, Female). Not on  anticoagulation at this time in effort to avoid triple therapy in the setting of anemia.  - continue BB  4. Stage 3 CKD - labs in system only from 2017. Unsure of exact baseline. - Creatinine elevated to 1.40 on admission. Increased to 1.75 on 07/04/2015.  5. HTN - BP has been 116/55 - 168/93 while admitted - continue to monitor   6. Type 2 DM - per admitting team  7. Anemia - Hgb stable at 9.9 - avoiding triple therapy at this time, therefore continuing with ASA and Plavix. No anticoagulation until 07/2015.  8. Right Upper Love Infiltrate - per admitting team  Signed, Erma Heritage, PA-C 07/04/2015,  7:42 AM Pager: [phone]

## 2015-07-04 NOTE — Progress Notes (Signed)
Initial appointment made at the [facility] on July 20, 2015 at 10:30am. Thank you.

## 2015-07-04 NOTE — Progress Notes (Signed)
Inpatient Diabetes Program Recommendations  AACE/ADA: New Consensus Statement on Inpatient Glycemic Control (2015)  Target Ranges:  Prepandial:   less than 140 mg/dL      Peak postprandial:   less than 180 mg/dL (1-2 hours)      Critically ill patients:  140 - 180 mg/dL  Results for Theresa Rogers, Theresa Rogers (MRN [id number]) as of 07/04/2015 11:13  Ref. Range 07/03/2015 13:07 07/03/2015 16:38 07/03/2015 20:43 07/04/2015 07:29  Glucose-Capillary Latest Ref Range: 65-99 mg/dL 201 (H) 231 (H) 314 (H) 268 (H)   Results for Theresa Rogers, Theresa Rogers (MRN [id number]) as of 07/04/2015 11:13  Ref. Range 06/21/2015 13:29  Hemoglobin A1C Latest Ref Range: 4.0-6.0 % 11.6 (H)   Review of Glycemic Control  Diabetes history: DM2 Outpatient Diabetes medications: Lantus 20 units QHS, Humalog 8-12 units TID with meals Current orders for Inpatient glycemic control: Lantus 20 units QHS, Novolog 0-9 units TID with meals, Novolog 8 units TID with meals for meal coverage  Inpatient Diabetes Program Recommendations:  Insulin-Correction: Please consider adding bedtime Novolog correction scale.  NOTE: Noted consult for diabetes coordinator. Reviewed chart and recommend adding Novolog bedtime correction scale at this time.  Of note, patient was seen by diabetes coordinator on 06/22/15 regarding A1C of 11.6% on 06/21/15 (see note for details).  Will continue to watch and follow trends to determine if additional adjustments need to be made with insulin orders.  Thanks, Barnie Alderman, RN, MSN, CDE Diabetes Coordinator Inpatient Diabetes Program [phone] (Team Pager from [place] to [place]) [phone] ([facility] office) [phone] [facility] office) [phone] [facility] office)

## 2015-07-05 ENCOUNTER — Telehealth: Payer: Self-pay

## 2015-07-05 LAB — PROTEIN ELECTROPHORESIS, SERUM
A/G Ratio: 1.1 (ref 0.7–1.7)
ALBUMIN ELP: 2.6 g/dL — AB (ref 2.9–4.4)
Alpha-1-Globulin: 0.3 g/dL (ref 0.0–0.4)
Alpha-2-Globulin: 0.8 g/dL (ref 0.4–1.0)
BETA GLOBULIN: 0.9 g/dL (ref 0.7–1.3)
GAMMA GLOBULIN: 0.5 g/dL (ref 0.4–1.8)
Globulin, Total: 2.4 g/dL (ref 2.2–3.9)
TOTAL PROTEIN ELP: 5 g/dL — AB (ref 6.0–8.5)

## 2015-07-05 LAB — PROTEIN ELECTRO, RANDOM URINE
ALPHA-2-GLOBULIN, U: 5.5 %
Albumin ELP, Urine: 70.9 %
Alpha-1-Globulin, U: 1.1 %
Beta Globulin, U: 12.9 %
Gamma Globulin, U: 9.6 %
TOTAL PROTEIN, URINE-UPE24: 193.9 mg/dL

## 2015-07-05 LAB — BASIC METABOLIC PANEL
ANION GAP: 6 (ref 5–15)
BUN: 46 mg/dL — ABNORMAL HIGH (ref 6–20)
CALCIUM: 8.2 mg/dL — AB (ref 8.9–10.3)
CHLORIDE: 104 mmol/L (ref 101–111)
CO2: 26 mmol/L (ref 22–32)
CREATININE: 2.1 mg/dL — AB (ref 0.44–1.00)
GFR calc Af Amer: 28 mL/min — ABNORMAL LOW (ref >60)
GFR calc non Af Amer: 24 mL/min — ABNORMAL LOW (ref >60)
GLUCOSE: 135 mg/dL — AB (ref 65–99)
Potassium: 3.8 mmol/L (ref 3.5–5.1)
Sodium: 136 mmol/L (ref 135–145)

## 2015-07-05 LAB — TROPONIN I: Troponin I: 1.1 ng/mL — ABNORMAL HIGH (ref <0.031)

## 2015-07-05 LAB — GLUCOSE, CAPILLARY: Glucose-Capillary: 112 mg/dL — ABNORMAL HIGH (ref 65–99)

## 2015-07-05 NOTE — Telephone Encounter (Signed)
Patient contacted regarding discharge from [facility] on Jan 25.  Patient understands to follow up with provider Dr. Fletcher Anon on Feb 6 at 3:15pm at [facility]. Patient understands discharge instructions? yes Patient understands medications and regiment? yes Patient understands to bring all medications to this visit? yes

## 2015-07-05 NOTE — Progress Notes (Signed)
Patient was doing well until she took her morning medications She feels that one of the medications is causing acute side effect of abdominal swelling, leg swelling  We have discussed this with her, suggested she hold the hydralazine for now If she does not have improvement of her symptoms without hydralazine, we would stop the isosorbide Initially was felt amlodipine was causing her symptoms. She is holding amlodipine  She will call our office if symptoms of leg swelling persists Expressed to her that this is not heart failure, we have performed aggressive diuresis over the past 2 days, now prerenal per her labs. She does not need additional diuresis at this time  Recommended she sit down with legs elevated or lay on the bed if leg swelling bothers her until medication side effect has worn off

## 2015-07-05 NOTE — Progress Notes (Signed)
CHF vest was 53.

## 2015-07-05 NOTE — Progress Notes (Addendum)
Patient: Theresa Rogers / Admit Date: 07/03/2015 / Date of Encounter: 07/05/2015, 9:51 AM   Subjective: Most of her leg edema resolved, no significant shortness of breath on exertion, feels back to her baseline. Still some slight swelling around her upper thighs. Denies having any chest pain concerning for angina. Troponin leak, more than 1 through her entire visit.  Review of Systems: Review of Systems  Constitutional: Positive for malaise/fatigue.  Respiratory: Negative.   Cardiovascular: Negative.   Gastrointestinal: Negative.   Musculoskeletal: Negative.   Neurological: Negative.   Psychiatric/Behavioral: Negative.   All other systems reviewed and are negative.   Objective: Telemetry: Normal sinus rhythm Physical Exam: Blood pressure 130/71, pulse 63, temperature 98.5 F (36.9 C), temperature source Oral, resp. rate 18, height 5\' 4"  (1.626 m), weight 170 lb 9.6 oz (77.384 kg), SpO2 92 %. Body mass index is 29.27 kg/(m^2). General: Well developed, well nourished, in no acute distress. Head: Normocephalic, atraumatic, sclera non-icteric, no xanthomas, nares are without discharge. Neck: Negative for carotid bruits. JVP not elevated. Lungs: Clear bilaterally to auscultation without wheezes, rales, or rhonchi. Breathing is unlabored. Heart: RRR S1 S2 without murmurs, rubs, or gallops.  Abdomen: Soft, non-tender, non-distended with normoactive bowel sounds. No rebound/guarding. Extremities: No clubbing or cyanosis. No edema. Distal pedal pulses are 2+ and equal bilaterally. Neuro: Alert and oriented X 3. Moves all extremities spontaneously. Psych:  Responds to questions appropriately with a normal affect.   Intake/Output Summary (Last 24 hours) at 07/05/15 0951 Last data filed at 07/05/15 0432  Gross per 24 hour  Intake    480 ml  Output   1550 ml  Net  -1070 ml    Inpatient Medications:  . aspirin EC  81 mg Oral Daily  . atorvastatin  40 mg Oral q1800  . clopidogrel   75 mg Oral Daily  . hydrALAZINE  50 mg Oral TID  . insulin aspart  0-9 Units Subcutaneous TID WC  . insulin aspart  8 Units Subcutaneous TID WC  . insulin glargine  20 Units Subcutaneous QHS  . isosorbide mononitrate  30 mg Oral BID  . metoprolol tartrate  75 mg Oral BID  . sodium chloride  3 mL Intravenous Q12H   Infusions:    Labs:  Recent Labs  07/04/15 0520 07/05/15 0502  NA 138 136  K 4.0 3.8  CL 108 104  CO2 25 26  GLUCOSE 280* 135*  BUN 40* 46*  CREATININE 1.75* 2.10*  CALCIUM 8.2* 8.2*   No results for input(s): AST, ALT, ALKPHOS, BILITOT, PROT, ALBUMIN in the last 72 hours.  Recent Labs  07/03/15 1740 07/04/15 1232  WBC 9.0 7.9  NEUTROABS  --  6.1  HGB 9.9* 9.0*  HCT 29.9* 27.5*  MCV 86.4 87.5  PLT 356 325    Recent Labs  07/03/15 1740 07/04/15 0143 07/04/15 0520 07/05/15 0502  TROPONINI 1.21* 1.35* 1.45* 1.10*   Invalid input(s): POCBNP No results for input(s): HGBA1C in the last 72 hours.   Weights: Filed Weights   07/03/15 1723 07/04/15 0412 07/05/15 0430  Weight: 171 lb 4.8 oz (77.701 kg) 170 lb 8 oz (77.338 kg) 170 lb 9.6 oz (77.384 kg)     Radiology/Studies:  Dg Chest 2 View  07/03/2015  CLINICAL DATA:  Abdominal and lower extremity swelling associated with shortness of breath; recently treated for pneumonia and recent placement of a cardiac stent ; history of cardiomyopathy with CHF, chronic renal insufficiency. EXAM: CHEST  2 VIEW COMPARISON:  Portable chest x-ray of June 19, 2015 FINDINGS: The lungs remain hypoinflated. There are persistent moderate-sized bilateral pleural effusions. The pulmonary interstitial markings remain increased and are not greatly changed. Infiltrate in the right upper lobe has improved somewhat but has not cleared entirely. The mediastinum is normal in width. The bony thorax exhibits no acute abnormality. There is calcification of the anterior longitudinal ligament of the thoracic spine. IMPRESSION: CHF with  pulmonary interstitial edema and bilateral pleural effusions not greatly changed since the previous study. Persistent interstitial infiltrate in the right upper lobe which has improved slightly. Electronically Signed   By: David  Martinique M.D.   On: 07/03/2015 08:55   US Renal  07/04/2015  CLINICAL DATA:  Stage III chronic kidney disease EXAM: RENAL / URINARY TRACT ULTRASOUND COMPLETE COMPARISON:  None. FINDINGS: Right Kidney: Length: 10.3 cm. Echogenicity within normal limits. No mass or hydronephrosis visualized. Left Kidney: Length: 10.1 cm. Echogenicity within normal limits. No mass or hydronephrosis visualized. Bladder: Appears normal for degree of bladder distention. Incidental note is made of a knee left pleural effusion. IMPRESSION: No acute renal abnormality noted. Left effusion Electronically Signed   By: Inez Catalina M.D.   On: 07/04/2015 11:52   Dg Chest Port 1 View  06/19/2015  CLINICAL DATA:  Cold, cough and shortness of Breath for 9 days. EXAM: PORTABLE CHEST 1 VIEW COMPARISON:  None. FINDINGS: The heart is mildly enlarged. The mediastinal and hilar contours are grossly normal. Low lung volumes with vascular crowding and bibasilar atelectasis. Probable small bilateral pleural effusions. Right upper lobe infiltrate is noted. The bony thorax is intact. IMPRESSION: Right upper lobe pneumonia. Low lung volumes with vascular crowding, bibasilar atelectasis and small pleural effusions. Electronically Signed   By: Marijo Sanes M.D.   On: 06/19/2015 09:26     Assessment and Plan  65 y.o. female   Acute on chronic systolic CHF Shortness of breath resolved after aggressive diuresis, Given climbing creatinine likely prerenal, would not discharge on Lasix Discussed with renal service, she will have labs on Friday, follow up in clinic next week Close follow-up in outpatient cardiology clinic in the next week or 2. I have called the clinic to arrange follow-up.\ She'll also need follow-up for her  outpatient PCI of the RCA  Coronary artery disease, status post stent Recent stent to the left circumflex, would continue aspirin and Plavix, statin, beta blocker  We'll schedule outpatient PCI of the RCA, follow up with Dr. Fletcher Anon in clinic to arrange in the next 2 weeks  Hypertension Would stop amlodipine, this can contribute to leg edema hydralazine up to 50 mg 3 times a day  Chronic renal insufficiency Worsening renal function after diuresis, Lasix on hold Elevated CHF this number within 50 likely secondary to resolving pneumonia  The above was discussed with the patient in detail  Total encounter time more than 35 minutes  Greater than 50% was spent in counseling and coordination of care with the patient  Signed, Esmond Plants, MD, Ph.D [facility] 07/05/2015, 9:51 AM

## 2015-07-05 NOTE — Discharge Summary (Signed)
[facility] at [place] NAME: Theresa Rogers    MR#:  [id number]  DATE OF BIRTH:  1950-07-05  DATE OF ADMISSION:  07/03/2015 ADMITTING PHYSICIAN: Fritzi Mandes, MD  DATE OF DISCHARGE: 07/05/2015  PRIMARY CARE PHYSICIAN: Pcp Not In System    ADMISSION DIAGNOSIS:  Acute on chronic systolic congestive heart failure (HCC) [I50.23]  DISCHARGE DIAGNOSIS:  Active Problems:   CHF (congestive heart failure) (HCC)   Acute on chronic systolic congestive heart failure (HCC)   Chronic kidney disease, stage III (moderate)   Angina pectoris (HCC)   Absolute anemia   Leg swelling   SECONDARY DIAGNOSIS:   Past Medical History  Diagnosis Date  . CAD (coronary artery disease)     a. s/p MI/syncope in 2009 s/p remote stenting; b. NSTEMI 06/2015, cardiac cath 06/2015 pLAD 70% FFR 0.88 not clinically sig, dLAD 90%, mLCx 80% s/p PCI/DES 0%, pRCA 70%, mRCA 80%, dRCA 80%  . Diabetes mellitus without complication ([place])   . Ischemic cardiomyopathy     a. echo 06/2015: EF 30-35%, diffuse HK, nable to exclude RWMA, LV diastolic function parameters were normal, mild MR, left atrium was mildly dilated, PASP 28 mm Hg, IVC was dilated c/w elevated CVP  . Chronic systolic CHF (congestive heart failure) ([place])   . PAF (paroxysmal atrial fibrillation) ([place])     a. new onset 06/2015; b. CHADS2VASc = 5 (CHF, HTN, age x 1, vascular dz, female); c. not on long term full dose anticoagulation until 07/2015 in an effort to avoid triple therapy given her anemia   . CKD (chronic kidney disease), stage III   . Anemia   . HTN (hypertension)   . HLD (hyperlipidemia)     HOSPITAL COURSE:   65 year old female with a history of CAD most recent mid left circumflex stent in January 2017, ischemic cardiomyopathy EF of 30-35%, PAF and stage III chronic kidney disease who presented with CHF exacerbation.  1. Acute on chronic systolic congestive heart failure/ischemic cardio myopathy:  Patient has a known ejection fraction of 30-35% with diffuse hypokinesis. Patient has improved with IV Lasix. At discharge her creatinine is increased from baseline. I will not discharge the patient on Lasix and she will have creatinine repeated on Friday and follow-up with nephrology and cardiology on Monday. Plan was discussed with the cardiologist and nephrologist. Patient was referred to CHF clinic. Patient needs to have Lasix once her creatinine has improved.   2.elevated troponin/ CAD: The elevation in troponin was thought to be due to demand ischemia and not ACS. She does have a right coronary artery that needs a PCI and therefore will follow-up next week with Dr. Fletcher Anon. She had no chest pain while in the hospital. Continue aspirin, Plavix, atorvastatin and metoprolol.  . 3. Chronic kidney disease stage III due to hypertension and diabetes: Creatinine has increased today due to overdiuresis. Patient will follow-up with Dr.Kolluru. When creatinine is at baseline she will restart Lasix. She was advised to avoid NSAIDs.  4. Diabetes type 2 with chronic kidney disease: Hemoglobin A1c is 11.6 which is not controlled.  Continue Lantus and siding scale insulin. Continue ADA diet. She needs close follow-up with her primary care physician.  5. Essential hypertension: Continue metoprolol and Imdur. She should avoid Norvasc as this could contribute to leg edema as per cardiology consultation.  DISCHARGE CONDITIONS AND DIET:   Patient be discharged home on a heart healthy/diabetic diet  CONSULTS OBTAINED:  Treatment Team:  Christia Reading  Gloriajean Dell, MD Lavonia Dana, MD  DRUG ALLERGIES:  No Known Allergies  DISCHARGE MEDICATIONS:   Current Discharge Medication List    CONTINUE these medications which have NOT CHANGED   Details  aspirin EC 81 MG EC tablet Take 1 tablet (81 mg total) by mouth daily.    atorvastatin (LIPITOR) 40 MG tablet Take 1 tablet (40 mg total) by mouth daily at 6 PM. Qty: 30  tablet, Refills: 0    clopidogrel (PLAVIX) 75 MG tablet Take 1 tablet (75 mg total) by mouth daily. Qty: 30 tablet, Refills: 0    hydrALAZINE (APRESOLINE) 50 MG tablet Take 1 tablet (50 mg total) by mouth 3 (three) times daily. Qty: 90 tablet, Refills: 3    insulin glargine (LANTUS) 100 UNIT/ML injection Inject 20 Units into the skin at bedtime.    insulin lispro (HUMALOG) 100 UNIT/ML injection Inject 8-12 Units into the skin 3 (three) times daily before meals.    isosorbide mononitrate (IMDUR) 30 MG 24 hr tablet Take 1 tablet (30 mg total) by mouth 2 (two) times daily. Qty: 60 tablet, Refills: 0    Metoprolol Tartrate 75 MG TABS Take 75 mg by mouth 2 (two) times daily. Qty: 60 tablet, Refills: 0      STOP taking these medications     levofloxacin (LEVAQUIN) 250 MG tablet               Today   CHIEF COMPLAINT:  Patient is ready to be discharged. Patient denies chest pain or shortness of breath.   VITAL SIGNS:  Blood pressure 130/71, pulse 63, temperature 98.5 F (36.9 C), temperature source Oral, resp. rate 18, height 5\' 4"  (1.626 m), weight 77.384 kg (170 lb 9.6 oz), SpO2 92 %.   REVIEW OF SYSTEMS:  Review of Systems  Constitutional: Negative for fever, chills and malaise/fatigue.  HENT: Negative for sore throat.   Eyes: Negative for blurred vision.  Respiratory: Negative for cough, hemoptysis, shortness of breath and wheezing.   Cardiovascular: Negative for chest pain, palpitations and leg swelling.  Gastrointestinal: Negative for nausea, vomiting, abdominal pain, diarrhea and blood in stool.  Genitourinary: Negative for dysuria.  Musculoskeletal: Negative for back pain.  Neurological: Negative for dizziness, tremors and headaches.  Endo/Heme/Allergies: Does not bruise/bleed easily.     PHYSICAL EXAMINATION:  GENERAL:  65 y.o.-year-old patient lying in the bed with no acute distress.  NECK:  Supple, no jugular venous distention. No thyroid enlargement,  no tenderness.  LUNGS: Normal breath sounds bilaterally, no wheezing, rales,rhonchi  No use of accessory muscles of respiration.  CARDIOVASCULAR: S1, S2 normal. No murmurs, rubs, or gallops.  ABDOMEN: Soft, non-tender, non-distended. Bowel sounds present. No organomegaly or mass.  EXTREMITIES: No pedal edema, cyanosis, or clubbing.  PSYCHIATRIC: The patient is alert and oriented x 3.  SKIN: No obvious rash, lesion, or ulcer.   DATA REVIEW:   CBC  Recent Labs Lab 07/04/15 1232  WBC 7.9  HGB 9.0*  HCT 27.5*  PLT 325    Chemistries   Recent Labs Lab 07/05/15 0502  NA 136  K 3.8  CL 104  CO2 26  GLUCOSE 135*  BUN 46*  CREATININE 2.10*  CALCIUM 8.2*    Cardiac Enzymes  Recent Labs Lab 07/04/15 0143 07/04/15 0520 07/05/15 0502  TROPONINI 1.35* 1.45* 1.10*    Microbiology Results  @[email or web address] @  RADIOLOGY:  US Renal  07/04/2015  CLINICAL DATA:  Stage III chronic kidney disease EXAM: RENAL / URINARY TRACT ULTRASOUND COMPLETE COMPARISON:  None. FINDINGS: Right Kidney: Length: 10.3 cm. Echogenicity within normal limits. No mass or hydronephrosis visualized. Left Kidney: Length: 10.1 cm. Echogenicity within normal limits. No mass or hydronephrosis visualized. Bladder: Appears normal for degree of bladder distention. Incidental note is made of a knee left pleural effusion. IMPRESSION: No acute renal abnormality noted. Left effusion Electronically Signed   By: Inez Catalina M.D.   On: 07/04/2015 11:52      Management plans discussed with the patient and she is in agreement. Stable for discharge home  Patient should follow up with cardiology  adn nephrology 3 days  CODE STATUS:     Code Status Orders        Start     Ordered   07/03/15 1715  Full code   Continuous     07/03/15 1714    Code Status History    Date Active Date Inactive Code Status Order ID Comments User Context   06/26/2015 12:16 PM 06/27/2015  7:14 PM Full Code [id number]  Wellington Hampshire, MD  Inpatient   06/22/2015  9:26 AM 06/26/2015 12:16 PM Full Code [id number]  Minna Merritts, MD Inpatient   06/19/2015 11:35 AM 06/22/2015  9:26 AM Full Code [id number]  Hillary Bow, MD ED      TOTAL TIME TAKING CARE OF THIS PATIENT: 35 minutes.    Note: This dictation was prepared with Dragon dictation along with smaller phrase technology. Any transcriptional errors that result from this process are unintentional.  Brandon Scarbrough M.D on 07/05/2015 at 11:06 AM  Between 7am to 6pm - Pager - [phone] After 6pm go to [email or web address] - password [email or web address]  Office  [phone]  CC: Primary care physician; Pcp Not In System

## 2015-07-05 NOTE — Progress Notes (Signed)
Discharge instructions explained to pt/ verbalized an understanding / iv and tele removed/ transported off unit via wheelchair.  

## 2015-07-05 NOTE — Progress Notes (Signed)
[facility]  ROUNDING NOTE   Subjective:   Breathing room air, sitting up in bed. Not in acute distress. No edema. No complaints.   Creatinine 2.1 (1.75)  Objective:  Vital signs in last 24 hours:  Temp:  [97.5 F (36.4 C)-98.5 F (36.9 C)] 98.5 F (36.9 C) (01/25 0430) Pulse Rate:  [60-70] 63 (01/25 0725) Resp:  [16-18] 18 (01/25 0725) BP: (120-149)/(55-72) 130/71 mmHg (01/25 0725) SpO2:  [88 %-93 %] 92 % (01/25 0725) Weight:  [77.384 kg (170 lb 9.6 oz)] 77.384 kg (170 lb 9.6 oz) (01/25 0430)  Weight change: 5.715 kg (12 lb 9.6 oz) Filed Weights   07/03/15 1723 07/04/15 0412 07/05/15 0430  Weight: 77.701 kg (171 lb 4.8 oz) 77.338 kg (170 lb 8 oz) 77.384 kg (170 lb 9.6 oz)    Intake/Output: I/O last 3 completed shifts: In: 723 [P.O.:720; I.V.:3] Out: 2450 [Urine:2450]   Intake/Output this shift:     Physical Exam: General: NAD, sitting up in bed  Head: Normocephalic, atraumatic. Moist oral mucosal membranes  Eyes: Anicteric, PERRL  Neck: Supple, trachea midline  Lungs:  Clear to auscultation  Heart: Regular rate and rhythm  Abdomen:  Soft, nontender,   Extremities: no peripheral edema.  Neurologic: Nonfocal, moving all four extremities  Skin: No lesions       Basic Metabolic Panel:  Recent Labs Lab 07/03/15 0833 07/03/15 1740 07/04/15 0520 07/05/15 0502  NA 138  --  138 136  K 4.1  --  4.0 3.8  CL 108  --  108 104  CO2 22  --  25 26  GLUCOSE 269*  --  280* 135*  BUN 32*  --  40* 46*  CREATININE 1.52* 1.40* 1.75* 2.10*  CALCIUM 8.6*  --  8.2* 8.2*    Liver Function Tests: No results for input(s): AST, ALT, ALKPHOS, BILITOT, PROT, ALBUMIN in the last 168 hours. No results for input(s): LIPASE, AMYLASE in the last 168 hours. No results for input(s): AMMONIA in the last 168 hours.  CBC:  Recent Labs Lab 07/03/15 0833 07/03/15 1740 07/04/15 1232  WBC 8.7 9.0 7.9  NEUTROABS  --   --  6.1  HGB 9.7* 9.9* 9.0*  HCT 29.1* 29.9*  27.5*  MCV 86.5 86.4 87.5  PLT 363 356 325    Cardiac Enzymes:  Recent Labs Lab 07/03/15 1419 07/03/15 1740 07/04/15 0143 07/04/15 0520 07/05/15 0502  TROPONINI 1.27* 1.21* 1.35* 1.45* 1.10*    BNP: Invalid input(s): POCBNP  CBG:  Recent Labs Lab 07/04/15 1146 07/04/15 1625 07/04/15 2100 07/04/15 2245 07/05/15 0812  GLUCAP 274* 111* 76 122* 112*    Microbiology: No results found for this or any previous visit.  Coagulation Studies: No results for input(s): LABPROT, INR in the last 72 hours.  Urinalysis:  Recent Labs  07/04/15 1039  COLORURINE YELLOW*  LABSPEC 1.014  PHURINE 5.0  GLUCOSEU 50*  HGBUR NEGATIVE  BILIRUBINUR NEGATIVE  KETONESUR NEGATIVE  PROTEINUR 100*  NITRITE NEGATIVE  LEUKOCYTESUR TRACE*      Imaging: US Renal  07/04/2015  CLINICAL DATA:  Stage III chronic Rogers disease EXAM: RENAL / URINARY TRACT ULTRASOUND COMPLETE COMPARISON:  None. FINDINGS: Right Rogers: Length: 10.3 cm. Echogenicity within normal limits. No mass or hydronephrosis visualized. Left Rogers: Length: 10.1 cm. Echogenicity within normal limits. No mass or hydronephrosis visualized. Bladder: Appears normal for degree of bladder distention. Incidental note is made of a knee left pleural effusion. IMPRESSION: No acute renal abnormality noted. Left effusion  Electronically Signed   By: Theresa [facility] M.D.   On: 07/04/2015 11:52     Medications:     . aspirin EC  81 mg Oral Daily  . atorvastatin  40 mg Oral q1800  . clopidogrel  75 mg Oral Daily  . hydrALAZINE  50 mg Oral TID  . insulin aspart  0-9 Units Subcutaneous TID WC  . insulin aspart  8 Units Subcutaneous TID WC  . insulin glargine  20 Units Subcutaneous QHS  . isosorbide mononitrate  30 mg Oral BID  . metoprolol tartrate  75 mg Oral BID  . sodium chloride  3 mL Intravenous Q12H   sodium chloride, acetaminophen, ondansetron (ZOFRAN) IV, sodium chloride  Assessment/ Plan:  [name] is a 65 y.o.  white female with coronary artery disease, diabetes mellitus type II, congestive heart failure, atrial fibrillation, anemia, hypertension, hyperlipidemia, who was admitted to [facility] on 07/03/2015 for Acute on chronic systolic congestive heart failure ([facility]) [I50.23]  1. Acute Renal Failure on Chronic Rogers disease stage III with proteinuria: chronic Rogers disease secondary to hypertension and diabetes.  - Discussed the diagnosis of chronic Rogers disease. labs reviewed.  - not currently on an ACE-I/ARB. Patient reports a reaction to lisinopril but does not recall what that is.   2. Hypertension and Acute exacerbation of systolic congestive heart failure with EF of 30-35% on echocardiogram from 06/19/2015. Blood pressure at goal.  - started on furosemide - clinical examination and respiratory status has improved significantly.  - hydralazine, imdur and metoprolol.  - low salt diet.   3. Diabetes mellitus type II with chronic Rogers disease: insulin dependent. Hemoglobin A1c of 11.6% - not well controlled.   4. Hyperlipidemia: not well controlled with lipid panel from 06/21/15 - atorvastatin as outpatient.    LOS: 2 [facility] 1/25/201711:06 AM

## 2015-07-07 ENCOUNTER — Telehealth: Payer: Self-pay | Admitting: *Deleted

## 2015-07-07 ENCOUNTER — Encounter: Payer: Self-pay | Admitting: Nurse Practitioner

## 2015-07-07 ENCOUNTER — Telehealth: Payer: Self-pay

## 2015-07-07 ENCOUNTER — Other Ambulatory Visit: Payer: Self-pay

## 2015-07-07 MED ORDER — FUROSEMIDE 20 MG PO TABS: 20.0000 mg | ORAL_TABLET | Freq: Every day | ORAL | Status: DC

## 2015-07-07 NOTE — Telephone Encounter (Signed)
I'm concerned that she is probably retaining fluid.  Has her weight increased?  It looks like she left the hospital 6 lbs above where she was at on 1/11 and she was not Rx @ d/c due to slight rise in creat.  I suspect that she will have to be evaluated in the ED for labs and probable IV diuresis.  Murray Hodgkins, NP 07/07/2015 3:17 PM

## 2015-07-07 NOTE — Telephone Encounter (Signed)
Pt c/o swelling: STAT is pt has developed SOB within 24 hours  1. How long have you been experiencing swelling? Went to ED Monday was released Wednesday and still swollen   2. Where is the swelling located? Legs and stomach   3.  Are you currently taking a "fluid pill"? They took her off, in hospital they had her have it though the IV   4.  Are you currently SOB? A little because her stomach is swelling.  5.  Have you traveled recently? Just to the hospital

## 2015-07-07 NOTE — Telephone Encounter (Signed)
S/w pt of Theresa Rogers's recommendations. Reports feeling better today after holding all of her medications. Continues to state she thinks it is related to the medications as soon after she takes them, her legs swell. She does not want to return to ED. States "every time I go to the hospital, I come out feeling worse". Encouraged pt to be evaluated today.  Explained reasons she needed to be seen in the ED. Pt verbalized understanding without confirmation that she will go there.  Pt appreciative of call with no further questions.

## 2015-07-07 NOTE — Telephone Encounter (Signed)
Forward to Chris Berge, NP 

## 2015-07-07 NOTE — Telephone Encounter (Signed)
Per verbal from Dr. Rockey Situ, pt should take lasix 20mg  once daily. Extra dose may be taken after lunch if increased swelling. Left message on pt VM to call back. I spoke with pt daughter, Threasa Beards, who indicates pt should be at home as she is watching her grandchildren.  I reviewed Dr. Donivan Scull recommendations w/Melanie who verbalized understanding. Inquires as to which pharmacy prescription sent. We have sent to [facility], [facility]. Daughter verbalized understanding with no further questions.

## 2015-07-07 NOTE — Telephone Encounter (Signed)
S/w pt who reports increased bilateral leg edema and abdominal swelling along with SOB since hospital d/c on Jan 25. States she feels increased SOB is due to abdominal swelling.  Per Dr. Rockey Situ when he saw her in the hospital, she has stopped hydralazine. Reports last dose taken was Wednesday morning. Half hour after she took hydralazine, she experience increased lower extremity swelling. Recently stopped amlodipine as it was thought to be the medication that was causing edema. States she isn't eating or drinking much because when she does, it make abdominal feel even more distended. She has not yet taken morning medications. Is waiting for further advice from MD. She had lab appt at nephrologist office today but states she can't even get into the car to drive. Her daughter has three young children and states she is unable to provide pt transportation. She has OV Monday w/Dr. Juleen China.   Per Dr. Donivan Scull notes on Jan 25:  Patient was doing well until she took her morning medications She feels that one of the medications is causing acute side effect of abdominal swelling, leg swelling  We have discussed this with her, suggested she hold the hydralazine for now If she does not have improvement of her symptoms without hydralazine, we would stop the isosorbide Initially was felt amlodipine was causing her symptoms. She is holding amlodipine  She will call our office if symptoms of leg swelling persists Expressed to her that this is not heart failure, we have performed aggressive diuresis over the past 2 days, now prerenal per her labs. She does not need additional diuresis at this time  Recommended she sit down with legs elevated or lay on the bed if leg swelling bothers her until medication side effect has worn off       Advised pt to continue to monitor s/s and seek immediate attention if sx worsen. I will forward to Dr. Rockey Situ to advise.

## 2015-07-08 NOTE — Telephone Encounter (Signed)
Would start lasix 20 mg daily with extra lasix 20 mg after lunch if needed for shortness of breath or ankle swelling Theresa Rogers has contacted patient

## 2015-07-10 ENCOUNTER — Telehealth: Payer: Self-pay

## 2015-07-10 NOTE — Telephone Encounter (Signed)
S/w pt who states she got message regarding adding lasix 20mg  qd w/extra dosage PRN. States at nephrology appt today, nephrologist doubled her dosage to 40mg . Confirmed Feb 6 appt w/Dr. Fletcher Anon. Pt verbalized understanding.

## 2015-07-17 ENCOUNTER — Other Ambulatory Visit
Admission: RE | Admit: 2015-07-17 | Discharge: 2015-07-17 | Disposition: A | Discharge status: Home / Self Care | Payer: Medicaid Other | Source: Ambulatory Visit | Attending: Cardiovascular Disease | Admitting: Cardiovascular Disease

## 2015-07-17 ENCOUNTER — Ambulatory Visit (INDEPENDENT_AMBULATORY_CARE_PROVIDER_SITE_OTHER): Payer: Self-pay | Admitting: Cardiovascular Disease

## 2015-07-17 ENCOUNTER — Encounter: Payer: Self-pay | Admitting: Cardiovascular Disease

## 2015-07-17 VITALS — BP 140/60 | HR 70 | Ht 64.0 in | Wt 161.5 lb

## 2015-07-17 DIAGNOSIS — I159 Secondary hypertension, unspecified: Secondary | ICD-10-CM

## 2015-07-17 DIAGNOSIS — I5022 Chronic systolic (congestive) heart failure: Secondary | ICD-10-CM

## 2015-07-17 DIAGNOSIS — I480 Paroxysmal atrial fibrillation: Secondary | ICD-10-CM | POA: Diagnosis not present

## 2015-07-17 DIAGNOSIS — I5023 Acute on chronic systolic (congestive) heart failure: Secondary | ICD-10-CM | POA: Diagnosis not present

## 2015-07-17 DIAGNOSIS — E785 Hyperlipidemia, unspecified: Secondary | ICD-10-CM

## 2015-07-17 DIAGNOSIS — I25118 Atherosclerotic heart disease of native coronary artery with other forms of angina pectoris: Secondary | ICD-10-CM

## 2015-07-17 LAB — CBC WITH DIFFERENTIAL/PLATELET
Basophils Absolute: 0.1 10*3/uL (ref 0–0.1)
Basophils Relative: 1 %
Eosinophils Absolute: 0.1 10*3/uL (ref 0–0.7)
Eosinophils Relative: 2 %
HEMATOCRIT: 30.9 % — AB (ref 35.0–47.0)
HEMOGLOBIN: 10.3 g/dL — AB (ref 12.0–16.0)
LYMPHS ABS: 1.3 10*3/uL (ref 1.0–3.6)
LYMPHS PCT: 15 %
MCH: 28.2 pg (ref 26.0–34.0)
MCHC: 33.3 g/dL (ref 32.0–36.0)
MCV: 84.7 fL (ref 80.0–100.0)
MONO ABS: 0.9 10*3/uL (ref 0.2–0.9)
MONOS PCT: 10 %
NEUTROS ABS: 6.4 10*3/uL (ref 1.4–6.5)
NEUTROS PCT: 72 %
Platelets: 300 10*3/uL (ref 150–440)
RBC: 3.65 MIL/uL — ABNORMAL LOW (ref 3.80–5.20)
RDW: 13.3 % (ref 11.5–14.5)
WBC: 8.8 10*3/uL (ref 3.6–11.0)

## 2015-07-17 LAB — BASIC METABOLIC PANEL
ANION GAP: 9 (ref 5–15)
BUN: 33 mg/dL — ABNORMAL HIGH (ref 6–20)
CALCIUM: 8.6 mg/dL — AB (ref 8.9–10.3)
CHLORIDE: 98 mmol/L — AB (ref 101–111)
CO2: 31 mmol/L (ref 22–32)
Creatinine, Ser: 1.47 mg/dL — ABNORMAL HIGH (ref 0.44–1.00)
GFR calc non Af Amer: 37 mL/min — ABNORMAL LOW (ref >60)
GFR, EST AFRICAN AMERICAN: 42 mL/min — AB (ref >60)
GLUCOSE: 245 mg/dL — AB (ref 65–99)
POTASSIUM: 3.8 mmol/L (ref 3.5–5.1)
Sodium: 138 mmol/L (ref 135–145)

## 2015-07-17 LAB — PROTIME-INR
INR: 0.95
Prothrombin Time: 12.9 seconds (ref 11.4–15.0)

## 2015-07-17 NOTE — Patient Instructions (Signed)
Medication Instructions:  Your physician recommends that you continue on your current medications as directed. Please refer to the Current Medication list given to you today.   Labwork: BMET, CBC, PT/INR  Testing/Procedures: none  Follow-Up: Your physician recommends that you schedule a follow-up appointment in: one month with Dr. Fletcher Anon.    Any Other Special Instructions Will Be Listed Below (If Applicable).     If you need a refill on your cardiac medications before your next appointment, please call your pharmacy.

## 2015-07-20 ENCOUNTER — Encounter: Payer: Self-pay | Admitting: Family

## 2015-07-20 ENCOUNTER — Ambulatory Visit: Payer: Medicaid Other | Attending: Family | Admitting: Family

## 2015-07-20 VITALS — BP 156/66 | HR 63 | Resp 20 | Ht 64.0 in | Wt 163.0 lb

## 2015-07-20 DIAGNOSIS — I5022 Chronic systolic (congestive) heart failure: Secondary | ICD-10-CM | POA: Insufficient documentation

## 2015-07-20 DIAGNOSIS — Z794 Long term (current) use of insulin: Secondary | ICD-10-CM | POA: Diagnosis not present

## 2015-07-20 DIAGNOSIS — I255 Ischemic cardiomyopathy: Secondary | ICD-10-CM | POA: Insufficient documentation

## 2015-07-20 DIAGNOSIS — I2510 Atherosclerotic heart disease of native coronary artery without angina pectoris: Secondary | ICD-10-CM | POA: Insufficient documentation

## 2015-07-20 DIAGNOSIS — E785 Hyperlipidemia, unspecified: Secondary | ICD-10-CM | POA: Diagnosis not present

## 2015-07-20 DIAGNOSIS — R0602 Shortness of breath: Secondary | ICD-10-CM | POA: Diagnosis not present

## 2015-07-20 DIAGNOSIS — I252 Old myocardial infarction: Secondary | ICD-10-CM | POA: Diagnosis not present

## 2015-07-20 DIAGNOSIS — I129 Hypertensive chronic kidney disease with stage 1 through stage 4 chronic kidney disease, or unspecified chronic kidney disease: Secondary | ICD-10-CM | POA: Insufficient documentation

## 2015-07-20 DIAGNOSIS — Z7982 Long term (current) use of aspirin: Secondary | ICD-10-CM | POA: Diagnosis not present

## 2015-07-20 DIAGNOSIS — N183 Chronic kidney disease, stage 3 unspecified: Secondary | ICD-10-CM

## 2015-07-20 DIAGNOSIS — E1122 Type 2 diabetes mellitus with diabetic chronic kidney disease: Secondary | ICD-10-CM | POA: Insufficient documentation

## 2015-07-20 DIAGNOSIS — I480 Paroxysmal atrial fibrillation: Secondary | ICD-10-CM | POA: Insufficient documentation

## 2015-07-20 DIAGNOSIS — Z888 Allergy status to other drugs, medicaments and biological substances status: Secondary | ICD-10-CM | POA: Diagnosis not present

## 2015-07-20 DIAGNOSIS — E118 Type 2 diabetes mellitus with unspecified complications: Secondary | ICD-10-CM | POA: Insufficient documentation

## 2015-07-20 DIAGNOSIS — I10 Essential (primary) hypertension: Secondary | ICD-10-CM

## 2015-07-20 NOTE — Patient Instructions (Signed)
Begin weighing daily and call for an overnight weight gain of > 2 pounds or a weekly weight gain of >5 pounds. 

## 2015-07-20 NOTE — Progress Notes (Signed)
HPI  Theresa Rogers is a 65 y.o. female with past medical history of CAD (PCI in 2009, NSTEMI w/ DES to Mid-LCx in 06/2015), Type 2 DM, Ischemic Cardiomyopathy (EF 30-35% by Echo 06/2015), PAF (new onset 06/2015), Stage 3 CKD, HTN, HLD, and Anemia who presents for a follow-up visit.  She was hospitalized at [facility] in January for acute systolic heart failure and non-ST elevation myocardial infarction. Echocardiogram showed an ejection fraction of 30-35%. She was noted to have elevated creatinine on presentation. She ultimately underwent cardiac catheterization which showed 70% stenosis in the mid LAD, 80% stenosis in the mid left circumflex and multiple 80% lesions in the right coronary artery. She underwent staged revascularization after a few days with FFR guidance. LAD FFR was not significant and thus PCI was deferred. Left circumflex PCI and drug-eluting stent placement was performed without complications.  She had another brief hospitalization for fluid overload and ultimately her condition stabilized with furosemide 40 mg twice daily. She does have chronic kidney disease and anemia. She has been maintaining in sinus rhythm and we elected not to anticoagulate especially in the setting of dual antiplatelet therapy. She reports feeling gradually improved with no chest pain. Her dyspnea is gradually improving.  Allergies  Allergen Reactions  . Hydralazine Hcl Swelling     Current Outpatient Prescriptions on File Prior to Visit  Medication Sig Dispense Refill  . aspirin EC 81 MG EC tablet Take 1 tablet (81 mg total) by mouth daily.    Marland Kitchen atorvastatin (LIPITOR) 40 MG tablet Take 1 tablet (40 mg total) by mouth daily at 6 PM. 30 tablet 0  . clopidogrel (PLAVIX) 75 MG tablet Take 1 tablet (75 mg total) by mouth daily. 30 tablet 0  . insulin glargine (LANTUS) 100 UNIT/ML injection Inject 20 Units into the skin at bedtime.    . insulin lispro (HUMALOG) 100 UNIT/ML injection Inject 8-12 Units into  the skin 3 (three) times daily before meals.    . isosorbide mononitrate (IMDUR) 30 MG 24 hr tablet Take 1 tablet (30 mg total) by mouth 2 (two) times daily. 60 tablet 0  . Metoprolol Tartrate 75 MG TABS Take 75 mg by mouth 2 (two) times daily. 60 tablet 0   No current facility-administered medications on file prior to visit.     Past Medical History  Diagnosis Date  . CAD (coronary artery disease)     a. s/p MI/syncope in 2009 s/p remote stenting; b. NSTEMI 06/2015, cardiac cath 06/2015 pLAD 70% FFR 0.88 not clinically sig, dLAD 90%, mLCx 80% s/p PCI/DES 0%, pRCA 70%, mRCA 80%, dRCA 80%  . Diabetes mellitus without complication ([place])   . Ischemic cardiomyopathy     a. echo 06/2015: EF 30-35%, diffuse HK, nable to exclude RWMA, LV diastolic function parameters were normal, mild MR, left atrium was mildly dilated, PASP 28 mm Hg, IVC was dilated c/w elevated CVP  . Chronic systolic CHF (congestive heart failure) ([place])   . PAF (paroxysmal atrial fibrillation) ([place])     a. new onset 06/2015; b. CHADS2VASc = 5 (CHF, HTN, age x 1, vascular dz, female); c. not on long term full dose anticoagulation until 07/2015 in an effort to avoid triple therapy given her anemia   . CKD (chronic kidney disease), stage III   . Anemia   . HTN (hypertension)   . HLD (hyperlipidemia)      Past Surgical History  Procedure Laterality Date  . Coronary angioplasty with stent placement    .  Cardiac catheterization N/A 06/22/2015    Procedure: Coronary Angiogram;  Surgeon: Minna Merritts, MD;  Location: [facility];  Service: Cardiovascular;  Laterality: N/A;  . Cardiac catheterization N/A 06/26/2015    Procedure: Coronary Stent Intervention;  Surgeon: Wellington Hampshire, MD;  Location: [place] [facility];  Service: Cardiovascular;  Laterality: N/A;     Family History  Problem Relation Age of Onset  . CAD Mother   . Alzheimer's disease Mother   . Prostate cancer Father   . Diabetes Brother   . Kidney  failure Brother      Social History   Social History  . Marital Status: Divorced    Spouse Name: N/A  . Number of Children: N/A  . Years of Education: N/A   Occupational History  . Not on file.   Social History Main Topics  . Smoking status: Never Smoker   . Smokeless tobacco: Never Used  . Alcohol Use: No  . Drug Use: No  . Sexual Activity: Not on file   Other Topics Concern  . Not on file   Social History Narrative       PHYSICAL EXAM   BP 140/60 mmHg  Pulse 70  Ht 5\' 4"  (1.626 m)  Wt 161 lb 8 oz (73.256 kg)  BMI 27.71 kg/m2 Constitutional: She is oriented to person, place, and time. She appears well-developed and well-nourished. No distress.  HENT: No nasal discharge.  Head: Normocephalic and atraumatic.  Eyes: Pupils are equal and round. No discharge.  Neck: Normal range of motion. Neck supple. No JVD present. No thyromegaly present.  Cardiovascular: Normal rate, regular rhythm, normal heart sounds. Exam reveals no gallop and no friction rub. No murmur heard.  Pulmonary/Chest: Effort normal and breath sounds normal. No stridor. No respiratory distress. She has no wheezes. She has no rales. She exhibits no tenderness.  Abdominal: Soft. Bowel sounds are normal. She exhibits no distension. There is no tenderness. There is no rebound and no guarding.  Musculoskeletal: Normal range of motion. She exhibits Mild edema and no tenderness.  Neurological: She is alert and oriented to person, place, and time. Coordination normal.  Skin: Skin is warm and dry. No rash noted. She is not diaphoretic. No erythema. No pallor.  Psychiatric: She has a normal mood and affect. Her behavior is normal. Judgment and thought content normal.     [id number] sinus rhythm with lateral T wave changes suggestive of ischemia.   ASSESSMENT AND PLAN

## 2015-07-20 NOTE — Assessment & Plan Note (Signed)
This is due to ischemic cardiomyopathy. Volume status improved after increasing the dose of Lasix to 40 mg twice daily. Continue treatment with metoprolol and isosorbide. Hydralazine was discontinued due to fluid retention but I will consider resuming this or switching her to an ace/ARB if renal function improves.

## 2015-07-20 NOTE — Assessment & Plan Note (Signed)
She is maintaining sinus rhythm. She is not on anticoagulation due to anemia and being on dual antiplatelet therapy.

## 2015-07-20 NOTE — Assessment & Plan Note (Signed)
The patient is doing reasonably well and she is tolerating dual antiplatelet therapy. He continues to have severe residual disease affecting the right coronary artery which is at risk for occlusion. I reviewed her cardiac catheterization and she has severe at least 95% stenosis in the distal segment and 80% stenosis in the midsegment and 60-70% proximal stenosis. I'm going to repeat her labs today, if renal function and anemia are improved, I recommend proceeding with staged RCA PCI.

## 2015-07-20 NOTE — Progress Notes (Signed)
Subjective:    Patient ID: Theresa Rogers, female    DOB: [birth year], 65 y.o.   MRN: [id number]  Congestive Heart Failure Presents for initial visit. The disease course has been improving. Associated symptoms include edema, fatigue and shortness of breath (with long distances). Pertinent negatives include no abdominal pain, chest pain, orthopnea, palpitations or paroxysmal nocturnal dyspnea. The symptoms have been improving. Past treatments include beta blockers and salt and fluid restriction. The treatment provided moderate relief. Compliance with prior treatments has been good. Her past medical history is significant for anemia, arrhythmia, CAD, DM and HTN. She has one 1st degree relative with heart disease. Compliance with total regimen is 76-100%.  Hypertension This is a chronic problem. The current episode started more than 1 year ago. The problem is unchanged. Associated symptoms include peripheral edema and shortness of breath (with long distances). Pertinent negatives include no chest pain, headaches, neck pain or palpitations. There are no associated agents to hypertension. Risk factors for coronary artery disease include diabetes mellitus, dyslipidemia, family history and post-menopausal state. Past treatments include beta blockers, diuretics and lifestyle changes. The current treatment provides moderate improvement. Compliance problems include medication side effects.  Hypertensive end-organ damage includes kidney disease and heart failure.  Other This is a new (edema) problem. The current episode started more than 1 month ago. The problem occurs daily. The problem has been gradually improving. Associated symptoms include congestion, coughing (on occasion), fatigue and myalgias (legs feel heavy due to swelling). Pertinent negatives include no abdominal pain, chest pain, headaches, neck pain, numbness or sore throat. The symptoms are aggravated by standing and walking. She has tried position  changes for the symptoms. The treatment provided mild relief.    Past Medical History  Diagnosis Date  . CAD (coronary artery disease)     a. s/p MI/syncope in 2009 s/p remote stenting; b. NSTEMI 06/2015, cardiac cath 06/2015 pLAD 70% FFR 0.88 not clinically sig, dLAD 90%, mLCx 80% s/p PCI/DES 0%, pRCA 70%, mRCA 80%, dRCA 80%  . Diabetes mellitus without complication ([place])   . Ischemic cardiomyopathy     a. echo 06/2015: EF 30-35%, diffuse HK, nable to exclude RWMA, LV diastolic function parameters were normal, mild MR, left atrium was mildly dilated, PASP 28 mm Hg, IVC was dilated c/w elevated CVP  . Chronic systolic CHF (congestive heart failure) ([place])   . PAF (paroxysmal atrial fibrillation) ([place])     a. new onset 06/2015; b. CHADS2VASc = 5 (CHF, HTN, age x 1, vascular dz, female); c. not on long term full dose anticoagulation until 07/2015 in an effort to avoid triple therapy given her anemia   . CKD (chronic kidney disease), stage III   . Anemia   . HTN (hypertension)   . HLD (hyperlipidemia)     Past Surgical History  Procedure Laterality Date  . Coronary angioplasty with stent placement    . Cardiac catheterization N/A 06/22/2015    Procedure: Coronary Angiogram;  Surgeon: Minna Merritts, MD;  Location: [place];  Service: Cardiovascular;  Laterality: N/A;  . Cardiac catheterization N/A 06/26/2015    Procedure: Coronary Stent Intervention;  Surgeon: Wellington Hampshire, MD;  Location: [facility];  Service: Cardiovascular;  Laterality: N/A;    Family History  Problem Relation Age of Onset  . CAD Mother   . Alzheimer's disease Mother   . Prostate cancer Father   . Diabetes Brother   . Kidney failure Brother     Social History  Substance Use Topics  . Smoking status: Never Smoker   . Smokeless tobacco: Never Used  . Alcohol Use: No    Allergies  Allergen Reactions  . Hydralazine Hcl Swelling    Prior to Admission medications   Medication Sig Start  Date End Date Taking? Authorizing Provider  aspirin EC 81 MG EC tablet Take 1 tablet (81 mg total) by mouth daily. 06/27/15  Yes Srikar Sudini, MD  atorvastatin (LIPITOR) 40 MG tablet Take 1 tablet (40 mg total) by mouth daily at 6 PM. 06/27/15  Yes Srikar Sudini, MD  clopidogrel (PLAVIX) 75 MG tablet Take 1 tablet (75 mg total) by mouth daily. 06/27/15  Yes Srikar Sudini, MD  furosemide (LASIX) 40 MG tablet Take 40 mg by mouth 2 (two) times daily.   Yes Historical Provider, MD  insulin glargine (LANTUS) 100 UNIT/ML injection Inject 20 Units into the skin at bedtime.   Yes Historical Provider, MD  insulin lispro (HUMALOG) 100 UNIT/ML injection Inject 8-12 Units into the skin 3 (three) times daily before meals.   Yes Historical Provider, MD  isosorbide mononitrate (IMDUR) 30 MG 24 hr tablet Take 1 tablet (30 mg total) by mouth 2 (two) times daily. 06/27/15  Yes Srikar Sudini, MD  Metoprolol Tartrate 75 MG TABS Take 75 mg by mouth 2 (two) times daily. 06/27/15  Yes Hillary Bow, MD     Review of Systems  Constitutional: Positive for fatigue. Negative for appetite change.  HENT: Positive for congestion. Negative for rhinorrhea and sore throat.   Eyes: Negative.   Respiratory: Positive for cough (on occasion) and shortness of breath (with long distances). Negative for chest tightness.   Cardiovascular: Positive for leg swelling. Negative for chest pain and palpitations.  Gastrointestinal: Negative for abdominal pain and abdominal distention.  Endocrine: Negative.   Genitourinary: Negative.   Musculoskeletal: Positive for myalgias (legs feel heavy due to swelling). Negative for back pain and neck pain.  Skin: Negative.   Allergic/Immunologic: Negative.   Neurological: Negative for dizziness, light-headedness, numbness and headaches.  Hematological: Negative for adenopathy. Does not bruise/bleed easily.  Psychiatric/Behavioral: Negative for sleep disturbance (sleeping on 1 pillow) and dysphoric mood.  The patient is not hyperactive.        Objective:   Physical Exam  Constitutional: She is oriented to person, place, and time. She appears well-developed and well-nourished.  HENT:  Head: Normocephalic and atraumatic.  Eyes: Conjunctivae are normal. Pupils are equal, round, and reactive to light.  Neck: Normal range of motion. Neck supple.  Cardiovascular: Normal rate and regular rhythm.   Pulmonary/Chest: Effort normal. She has no wheezes. She has no rales.  Abdominal: Soft. She exhibits no distension. There is no tenderness.  Musculoskeletal: She exhibits edema (1+ pitting edema in bilateral lower legs). She exhibits no tenderness.  Neurological: She is alert and oriented to person, place, and time.  Skin: Skin is warm and dry.  Psychiatric: She has a normal mood and affect. Her behavior is normal. Thought content normal.  Nursing note and vitals reviewed.         Assessment & Plan:  1: Chronic heart failure with reduced ejection fraction- Patient presents with fatigue and shortness of breath when walking long distances. She says that she didn't have any symptoms upon walking into the office. She is not weighing herself because she has to go get some scales. She says that she's going to pick some up today. Instructed her to weigh first thing in the morning after using the  bathroom and to call for an overnight weight gain of >2 pounds or a weekly weight gain of >5 pounds. She has some chronic edema in both of her lower legs but she does say that it's improving as her medications have been adjusted. Discussed adding Entresto at her next visit & information was given to her about this. Also discussed increasing her metoprolol as well if her heart rate allows. [facility] PharmD went in and reviewed medications with the patient. She does not add salt to her food and has been reading food labels. Discussed a 2000mg  sodium diet with patient and written information was given to her about this.  2:  HTN- Blood pressure looks good today. 3: Diabetes- Patient says that her glucose this morning was 152 and she continues to take her insulin. Does follow with nephrology and is working on getting a PCP appointment as she moved here a few months ago.   Return here in 1 month or sooner for any questions/problems before then.

## 2015-07-20 NOTE — Assessment & Plan Note (Signed)
Lab Results  Component Value Date   CHOL 222* 06/21/2015   HDL 41 06/21/2015   LDLCALC 149* 06/21/2015   TRIG 162* 06/21/2015   CHOLHDL 5.4 06/21/2015   She was started on atorvastatin. Recommend a target LDL of less than 70.

## 2015-07-21 ENCOUNTER — Telehealth: Payer: Self-pay

## 2015-07-21 NOTE — Telephone Encounter (Signed)
Per Dr. Fletcher Anon: " We should go ahead and schedule RCA PCI on February 23 at [facility]. If she wants the procedure done before then, I would have to do it at [facility] on Wednesday of next week.        Left message on machine for patient to contact the office.

## 2015-07-24 ENCOUNTER — Other Ambulatory Visit: Payer: Self-pay

## 2015-07-24 ENCOUNTER — Telehealth: Payer: Self-pay

## 2015-07-24 DIAGNOSIS — Z01812 Encounter for preprocedural laboratory examination: Secondary | ICD-10-CM

## 2015-07-24 NOTE — Telephone Encounter (Signed)
S/w pt who is agreeable w/RCA PCI 2/23, 8:30am Reviewed instructions. Pt understands she needs labs by 2/21 at [facility] lab. Lab orders place, information mailed to pt. MD aware.

## 2015-07-24 NOTE — Patient Instructions (Signed)
  [place] Cardiac Cath/PCI Instructions   You are scheduled for a Cardiac Cath on: Feb 23  Please arrive at 7:30am on the day of your procedure  You will need to pre-register prior to the day of your procedure.  Enter through the [employer] at [facility].  Registration is the first desk on your right.  Please take the procedure order we have given you in order to be registered appropriately  Do not eat/drink anything after midnight  Someone will need to drive you home  It is recommended someone be with you for the first 24 hours after your procedure  Wear clothes that are easy to get on/off and wear slip on shoes if possible   Medications bring a current list of all medications with you  __xx_ You may take all of your oral medications the morning of your procedure with enough water to swallow safely  DO NOT TAKE YOUR MORNING INSULIN. HOLD OR TAKE 1/2 DOSE OF PM INSULIN THE NIGHT BEFORE.  Please have your labs no later thatn Feb 21. You may take the enclosed lab orders to the [place] entrance of [facility] and proceed to Registration. They will direct you to the lab.   Day of your procedure: Arrive at the [facility] entrance.  Free valet service is available.  After entering the [place] please check-in at the registration desk (1st desk on your right) to receive your armband. After receiving your armband someone will escort you to the cardiac cath/special procedures waiting area.  The usual length of stay after your procedure is about 2 to 3 hours.  This can vary.  If you have any questions, please call our office at [phone], or you may call the cardiac cath lab at [facility] directly at [phone]

## 2015-07-24 NOTE — Telephone Encounter (Signed)
Pt is returning your call. Please call.

## 2015-08-02 ENCOUNTER — Telehealth: Payer: Self-pay

## 2015-08-02 ENCOUNTER — Other Ambulatory Visit
Admission: RE | Admit: 2015-08-02 | Discharge: 2015-08-02 | Disposition: A | Discharge status: Home / Self Care | Payer: Medicaid Other | Source: Ambulatory Visit | Attending: Cardiovascular Disease | Admitting: Cardiovascular Disease

## 2015-08-02 DIAGNOSIS — Z01812 Encounter for preprocedural laboratory examination: Secondary | ICD-10-CM

## 2015-08-02 LAB — CBC
HCT: 32.3 % — ABNORMAL LOW (ref 35.0–47.0)
HEMOGLOBIN: 10.7 g/dL — AB (ref 12.0–16.0)
MCH: 28.1 pg (ref 26.0–34.0)
MCHC: 33.2 g/dL (ref 32.0–36.0)
MCV: 84.8 fL (ref 80.0–100.0)
PLATELETS: 349 10*3/uL (ref 150–440)
RBC: 3.8 MIL/uL (ref 3.80–5.20)
RDW: 13 % (ref 11.5–14.5)
WBC: 6.9 10*3/uL (ref 3.6–11.0)

## 2015-08-02 LAB — BASIC METABOLIC PANEL
ANION GAP: 11 (ref 5–15)
BUN: 49 mg/dL — ABNORMAL HIGH (ref 6–20)
CALCIUM: 8.8 mg/dL — AB (ref 8.9–10.3)
CO2: 30 mmol/L (ref 22–32)
CREATININE: 1.89 mg/dL — AB (ref 0.44–1.00)
Chloride: 97 mmol/L — ABNORMAL LOW (ref 101–111)
GFR, EST AFRICAN AMERICAN: 31 mL/min — AB (ref >60)
GFR, EST NON AFRICAN AMERICAN: 27 mL/min — AB (ref >60)
Glucose, Bld: 374 mg/dL — ABNORMAL HIGH (ref 65–99)
Potassium: 4.5 mmol/L (ref 3.5–5.1)
SODIUM: 138 mmol/L (ref 135–145)

## 2015-08-02 LAB — PROTIME-INR
INR: 0.96
PROTHROMBIN TIME: 13 s (ref 11.4–15.0)

## 2015-08-02 NOTE — Telephone Encounter (Signed)
Reviewed PCI instructions with pt who verbalized understanding.

## 2015-08-03 ENCOUNTER — Encounter: Payer: Self-pay | Admitting: *Deleted

## 2015-08-03 ENCOUNTER — Observation Stay
Admission: RE | Admit: 2015-08-03 | Discharge: 2015-08-04 | Disposition: A | Discharge status: Home / Self Care | Payer: Medicaid Other | Source: Ambulatory Visit | Attending: Cardiovascular Disease | Admitting: Cardiovascular Disease

## 2015-08-03 ENCOUNTER — Encounter
Admission: RE | Disposition: A | Discharge status: Home / Self Care | Payer: Self-pay | Source: Ambulatory Visit | Attending: Cardiovascular Disease

## 2015-08-03 DIAGNOSIS — Z841 Family history of disorders of kidney and ureter: Secondary | ICD-10-CM | POA: Diagnosis not present

## 2015-08-03 DIAGNOSIS — I130 Hypertensive heart and chronic kidney disease with heart failure and stage 1 through stage 4 chronic kidney disease, or unspecified chronic kidney disease: Secondary | ICD-10-CM | POA: Diagnosis not present

## 2015-08-03 DIAGNOSIS — Z79899 Other long term (current) drug therapy: Secondary | ICD-10-CM | POA: Insufficient documentation

## 2015-08-03 DIAGNOSIS — I9751 Accidental puncture and laceration of a circulatory system organ or structure during a circulatory system procedure: Secondary | ICD-10-CM | POA: Diagnosis not present

## 2015-08-03 DIAGNOSIS — E1122 Type 2 diabetes mellitus with diabetic chronic kidney disease: Secondary | ICD-10-CM | POA: Diagnosis not present

## 2015-08-03 DIAGNOSIS — S55109A Unspecified injury of radial artery at forearm level, unspecified arm, initial encounter: Secondary | ICD-10-CM | POA: Diagnosis present

## 2015-08-03 DIAGNOSIS — Z8042 Family history of malignant neoplasm of prostate: Secondary | ICD-10-CM | POA: Diagnosis not present

## 2015-08-03 DIAGNOSIS — Z538 Procedure and treatment not carried out for other reasons: Secondary | ICD-10-CM | POA: Insufficient documentation

## 2015-08-03 DIAGNOSIS — I252 Old myocardial infarction: Secondary | ICD-10-CM | POA: Insufficient documentation

## 2015-08-03 DIAGNOSIS — I5022 Chronic systolic (congestive) heart failure: Secondary | ICD-10-CM | POA: Diagnosis present

## 2015-08-03 DIAGNOSIS — Z833 Family history of diabetes mellitus: Secondary | ICD-10-CM | POA: Diagnosis not present

## 2015-08-03 DIAGNOSIS — Y92234 Operating room of hospital as the place of occurrence of the external cause: Secondary | ICD-10-CM | POA: Diagnosis not present

## 2015-08-03 DIAGNOSIS — E118 Type 2 diabetes mellitus with unspecified complications: Secondary | ICD-10-CM

## 2015-08-03 DIAGNOSIS — Y713 Surgical instruments, materials and cardiovascular devices (including sutures) associated with adverse incidents: Secondary | ICD-10-CM | POA: Insufficient documentation

## 2015-08-03 DIAGNOSIS — E785 Hyperlipidemia, unspecified: Secondary | ICD-10-CM | POA: Diagnosis not present

## 2015-08-03 DIAGNOSIS — Z8249 Family history of ischemic heart disease and other diseases of the circulatory system: Secondary | ICD-10-CM | POA: Diagnosis not present

## 2015-08-03 DIAGNOSIS — Z888 Allergy status to other drugs, medicaments and biological substances status: Secondary | ICD-10-CM | POA: Insufficient documentation

## 2015-08-03 DIAGNOSIS — I255 Ischemic cardiomyopathy: Secondary | ICD-10-CM | POA: Diagnosis present

## 2015-08-03 DIAGNOSIS — Y658 Other specified misadventures during surgical and medical care: Secondary | ICD-10-CM | POA: Insufficient documentation

## 2015-08-03 DIAGNOSIS — Z820 Family history of epilepsy and other diseases of the nervous system: Secondary | ICD-10-CM | POA: Diagnosis not present

## 2015-08-03 DIAGNOSIS — I2510 Atherosclerotic heart disease of native coronary artery without angina pectoris: Secondary | ICD-10-CM | POA: Diagnosis not present

## 2015-08-03 DIAGNOSIS — I10 Essential (primary) hypertension: Secondary | ICD-10-CM | POA: Diagnosis present

## 2015-08-03 DIAGNOSIS — N183 Chronic kidney disease, stage 3 unspecified: Secondary | ICD-10-CM | POA: Diagnosis present

## 2015-08-03 DIAGNOSIS — Z7982 Long term (current) use of aspirin: Secondary | ICD-10-CM | POA: Insufficient documentation

## 2015-08-03 DIAGNOSIS — I480 Paroxysmal atrial fibrillation: Secondary | ICD-10-CM | POA: Diagnosis present

## 2015-08-03 DIAGNOSIS — Z955 Presence of coronary angioplasty implant and graft: Secondary | ICD-10-CM | POA: Diagnosis not present

## 2015-08-03 DIAGNOSIS — Z794 Long term (current) use of insulin: Secondary | ICD-10-CM | POA: Insufficient documentation

## 2015-08-03 HISTORY — PX: CARDIAC CATHETERIZATION: SHX172

## 2015-08-03 LAB — GLUCOSE, CAPILLARY
GLUCOSE-CAPILLARY: 181 mg/dL — AB (ref 65–99)
Glucose-Capillary: 375 mg/dL — ABNORMAL HIGH (ref 65–99)
Glucose-Capillary: 353 mg/dL — ABNORMAL HIGH (ref 65–99)

## 2015-08-03 SURGERY — CORONARY STENT INTERVENTION
Anesthesia: Moderate Sedation

## 2015-08-03 MED ORDER — METOPROLOL TARTRATE 25 MG PO TABS
75.0000 mg | ORAL_TABLET | Freq: Two times a day (BID) | ORAL | Status: DC
  Administered 2015-08-03 – 2015-08-04 (×2): 75 mg via ORAL
  Filled 2015-08-03 (×2): qty 3

## 2015-08-03 MED ORDER — SODIUM CHLORIDE 0.9% FLUSH
3.0000 mL | Freq: Two times a day (BID) | INTRAVENOUS | Status: DC
Start: 2015-08-03 — End: 2015-08-03

## 2015-08-03 MED ORDER — SODIUM CHLORIDE 0.9 % IV SOLN: INTRAVENOUS | Status: AC

## 2015-08-03 MED ORDER — MIDAZOLAM HCL 2 MG/2ML IJ SOLN
INTRAMUSCULAR | Status: DC | PRN
  Administered 2015-08-03: 1 mg via INTRAVENOUS
  Administered 2015-08-03: 0.5 mg via INTRAVENOUS

## 2015-08-03 MED ORDER — ISOSORBIDE MONONITRATE ER 30 MG PO TB24
30.0000 mg | ORAL_TABLET | Freq: Two times a day (BID) | ORAL | Status: DC
  Administered 2015-08-03 – 2015-08-04 (×2): 30 mg via ORAL
  Filled 2015-08-03 (×2): qty 1

## 2015-08-03 MED ORDER — CLOPIDOGREL BISULFATE 75 MG PO TABS
75.0000 mg | ORAL_TABLET | Freq: Every day | ORAL | Status: DC
  Administered 2015-08-04: 75 mg via ORAL
  Filled 2015-08-03: qty 1

## 2015-08-03 MED ORDER — INSULIN ASPART 100 UNIT/ML ~~LOC~~ SOLN
0.0000 [IU] | Freq: Three times a day (TID) | SUBCUTANEOUS | Status: DC
  Administered 2015-08-03 (×2): 15 [IU] via SUBCUTANEOUS
  Administered 2015-08-04: 2 [IU] via SUBCUTANEOUS
  Filled 2015-08-03: qty 15
  Filled 2015-08-03: qty 2
  Filled 2015-08-03: qty 15

## 2015-08-03 MED ORDER — LABETALOL HCL 5 MG/ML IV SOLN
10.0000 mg | Freq: Once | INTRAVENOUS | Status: DC
  Filled 2015-08-03: qty 4

## 2015-08-03 MED ORDER — IOHEXOL 300 MG/ML  SOLN
INTRAMUSCULAR | Status: DC | PRN
  Administered 2015-08-03: 10 mL via INTRA_ARTERIAL

## 2015-08-03 MED ORDER — NITROGLYCERIN 5 MG/ML IV SOLN
INTRAVENOUS | Status: AC
  Filled 2015-08-03: qty 10

## 2015-08-03 MED ORDER — BIVALIRUDIN BOLUS VIA INFUSION - CUPID
INTRAVENOUS | Status: DC | PRN
  Administered 2015-08-03: 55.425 mg via INTRAVENOUS

## 2015-08-03 MED ORDER — FENTANYL CITRATE (PF) 100 MCG/2ML IJ SOLN
INTRAMUSCULAR | Status: DC | PRN
  Administered 2015-08-03 (×2): 25 ug via INTRAVENOUS

## 2015-08-03 MED ORDER — INSULIN GLARGINE 100 UNIT/ML ~~LOC~~ SOLN
20.0000 [IU] | Freq: Every day | SUBCUTANEOUS | Status: DC
  Administered 2015-08-03: 20 [IU] via SUBCUTANEOUS
  Filled 2015-08-03 (×2): qty 0.2

## 2015-08-03 MED ORDER — SODIUM CHLORIDE 0.9 % IV SOLN: 250.0000 mL | INTRAVENOUS | Status: DC | PRN

## 2015-08-03 MED ORDER — SODIUM CHLORIDE 0.9% FLUSH: 3.0000 mL | INTRAVENOUS | Status: DC | PRN

## 2015-08-03 MED ORDER — SODIUM CHLORIDE 0.9 % IV SOLN
INTRAVENOUS | Status: DC
  Administered 2015-08-03: 08:00:00 via INTRAVENOUS

## 2015-08-03 MED ORDER — INSULIN ASPART 100 UNIT/ML ~~LOC~~ SOLN: 0.0000 [IU] | Freq: Every day | SUBCUTANEOUS | Status: DC

## 2015-08-03 MED ORDER — HEPARIN (PORCINE) IN NACL 2-0.9 UNIT/ML-% IJ SOLN
INTRAMUSCULAR | Status: AC
  Filled 2015-08-03: qty 1000

## 2015-08-03 MED ORDER — ATORVASTATIN CALCIUM 20 MG PO TABS
40.0000 mg | ORAL_TABLET | Freq: Every day | ORAL | Status: DC
  Administered 2015-08-03: 40 mg via ORAL
  Filled 2015-08-03: qty 2

## 2015-08-03 MED ORDER — ONDANSETRON HCL 4 MG/2ML IJ SOLN: 4.0000 mg | Freq: Four times a day (QID) | INTRAMUSCULAR | Status: DC | PRN

## 2015-08-03 MED ORDER — ACETAMINOPHEN 325 MG PO TABS: 650.0000 mg | ORAL_TABLET | ORAL | Status: DC | PRN

## 2015-08-03 MED ORDER — SODIUM CHLORIDE 0.9% FLUSH
3.0000 mL | Freq: Two times a day (BID) | INTRAVENOUS | Status: DC
  Administered 2015-08-03 – 2015-08-04 (×2): 3 mL via INTRAVENOUS

## 2015-08-03 MED ORDER — VERAPAMIL HCL 2.5 MG/ML IV SOLN
INTRAVENOUS | Status: AC
  Filled 2015-08-03: qty 2

## 2015-08-03 MED ORDER — ASPIRIN EC 81 MG PO TBEC
81.0000 mg | DELAYED_RELEASE_TABLET | Freq: Every day | ORAL | Status: DC
  Administered 2015-08-04: 81 mg via ORAL
  Filled 2015-08-03: qty 1

## 2015-08-03 MED ORDER — FENTANYL CITRATE (PF) 100 MCG/2ML IJ SOLN
INTRAMUSCULAR | Status: AC
  Filled 2015-08-03: qty 2

## 2015-08-03 MED ORDER — MIDAZOLAM HCL 2 MG/2ML IJ SOLN
INTRAMUSCULAR | Status: AC
  Filled 2015-08-03: qty 2

## 2015-08-03 MED ORDER — SODIUM CHLORIDE 0.9 % IV SOLN
250.0000 mg | INTRAVENOUS | Status: DC | PRN
  Administered 2015-08-03: 1.75 mg/kg/h via INTRAVENOUS

## 2015-08-03 MED ORDER — BIVALIRUDIN 250 MG IV SOLR
INTRAVENOUS | Status: AC
  Filled 2015-08-03: qty 250

## 2015-08-03 SURGICAL SUPPLY — 10 items
CATH VISTA GUIDE 6FR JR4 (CATHETERS) ×3 IMPLANT
DEVICE INFLAT 30 PLUS (MISCELLANEOUS) ×6 IMPLANT
DEVICE RAD TR BAND REGULAR (VASCULAR PRODUCTS) ×3 IMPLANT
GLIDESHEATH SLEND SS 6F .021 (SHEATH) ×3 IMPLANT
KIT MANI 3VAL PERCEP (MISCELLANEOUS) ×3 IMPLANT
PACK CARDIAC CATH (CUSTOM PROCEDURE TRAY) ×3 IMPLANT
VALVE COPILOT STAT (MISCELLANEOUS) ×3 IMPLANT
WIRE HITORQ VERSACORE ST 145CM (WIRE) ×3 IMPLANT
WIRE RUNTHROUGH .014X180CM (WIRE) ×3 IMPLANT
WIRE SAFE-T 1.5MM-J .035X260CM (WIRE) ×3 IMPLANT

## 2015-08-03 NOTE — Interval H&P Note (Signed)
History and Physical Interval Note:  08/03/2015 8:39 AM  Theresa Rogers  has presented today for surgery, with the diagnosis of Chest Pain   The various methods of treatment have been discussed with the patient and family. After consideration of risks, benefits and other options for treatment, the patient has consented to  Procedure(s): Coronary Stent Intervention (N/A) as a surgical intervention .  The patient's history has been reviewed, patient examined, no change in status, stable for surgery.  I have reviewed the patient's chart and labs.  Questions were answered to the patient's satisfaction.     Kathlyn [place]

## 2015-08-03 NOTE — H&P (View-Only) (Signed)
HPI  Theresa Rogers is a 65 y.o. female with past medical history of CAD (PCI in 2009, NSTEMI w/ DES to Mid-LCx in 06/2015), Type 2 DM, Ischemic Cardiomyopathy (EF 30-35% by Echo 06/2015), PAF (new onset 06/2015), Stage 3 CKD, HTN, HLD, and Anemia who presents for a follow-up visit.  She was hospitalized at [facility] in January for acute systolic heart failure and non-ST elevation myocardial infarction. Echocardiogram showed an ejection fraction of 30-35%. She was noted to have elevated creatinine on presentation. She ultimately underwent cardiac catheterization which showed 70% stenosis in the mid LAD, 80% stenosis in the mid left circumflex and multiple 80% lesions in the right coronary artery. She underwent staged revascularization after a few days with FFR guidance. LAD FFR was not significant and thus PCI was deferred. Left circumflex PCI and drug-eluting stent placement was performed without complications.  She had another brief hospitalization for fluid overload and ultimately her condition stabilized with furosemide 40 mg twice daily. She does have chronic kidney disease and anemia. She has been maintaining in sinus rhythm and we elected not to anticoagulate especially in the setting of dual antiplatelet therapy. She reports feeling gradually improved with no chest pain. Her dyspnea is gradually improving.  Allergies  Allergen Reactions  . Hydralazine Hcl Swelling     Current Outpatient Prescriptions on File Prior to Visit  Medication Sig Dispense Refill  . aspirin EC 81 MG EC tablet Take 1 tablet (81 mg total) by mouth daily.    Marland Kitchen atorvastatin (LIPITOR) 40 MG tablet Take 1 tablet (40 mg total) by mouth daily at 6 PM. 30 tablet 0  . clopidogrel (PLAVIX) 75 MG tablet Take 1 tablet (75 mg total) by mouth daily. 30 tablet 0  . insulin glargine (LANTUS) 100 UNIT/ML injection Inject 20 Units into the skin at bedtime.    . insulin lispro (HUMALOG) 100 UNIT/ML injection Inject 8-12 Units into  the skin 3 (three) times daily before meals.    . isosorbide mononitrate (IMDUR) 30 MG 24 hr tablet Take 1 tablet (30 mg total) by mouth 2 (two) times daily. 60 tablet 0  . Metoprolol Tartrate 75 MG TABS Take 75 mg by mouth 2 (two) times daily. 60 tablet 0   No current facility-administered medications on file prior to visit.     Past Medical History  Diagnosis Date  . CAD (coronary artery disease)     a. s/p MI/syncope in 2009 s/p remote stenting; b. NSTEMI 06/2015, cardiac cath 06/2015 pLAD 70% FFR 0.88 not clinically sig, dLAD 90%, mLCx 80% s/p PCI/DES 0%, pRCA 70%, mRCA 80%, dRCA 80%  . Diabetes mellitus without complication ([place])   . Ischemic cardiomyopathy     a. echo 06/2015: EF 30-35%, diffuse HK, nable to exclude RWMA, LV diastolic function parameters were normal, mild MR, left atrium was mildly dilated, PASP 28 mm Hg, IVC was dilated c/w elevated CVP  . Chronic systolic CHF (congestive heart failure) ([place])   . PAF (paroxysmal atrial fibrillation) ([place])     a. new onset 06/2015; b. CHADS2VASc = 5 (CHF, HTN, age x 1, vascular dz, female); c. not on long term full dose anticoagulation until 07/2015 in an effort to avoid triple therapy given her anemia   . CKD (chronic kidney disease), stage III   . Anemia   . HTN (hypertension)   . HLD (hyperlipidemia)      Past Surgical History  Procedure Laterality Date  . Coronary angioplasty with stent placement    .  Cardiac catheterization N/A 06/22/2015    Procedure: Coronary Angiogram;  Surgeon: Minna Merritts, MD;  Location: [facility];  Service: Cardiovascular;  Laterality: N/A;  . Cardiac catheterization N/A 06/26/2015    Procedure: Coronary Stent Intervention;  Surgeon: Wellington Hampshire, MD;  Location: [facility];  Service: Cardiovascular;  Laterality: N/A;     Family History  Problem Relation Age of Onset  . CAD Mother   . Alzheimer's disease Mother   . Prostate cancer Father   . Diabetes Brother   . Kidney  failure Brother      Social History   Social History  . Marital Status: Divorced    Spouse Name: N/A  . Number of Children: N/A  . Years of Education: N/A   Occupational History  . Not on file.   Social History Main Topics  . Smoking status: Never Smoker   . Smokeless tobacco: Never Used  . Alcohol Use: No  . Drug Use: No  . Sexual Activity: Not on file   Other Topics Concern  . Not on file   Social History Narrative       PHYSICAL EXAM   BP 140/60 mmHg  Pulse 70  Ht 5\' 4"  (1.626 m)  Wt 161 lb 8 oz (73.256 kg)  BMI 27.71 kg/m2 Constitutional: She is oriented to person, place, and time. She appears well-developed and well-nourished. No distress.  HENT: No nasal discharge.  Head: Normocephalic and atraumatic.  Eyes: Pupils are equal and round. No discharge.  Neck: Normal range of motion. Neck supple. No JVD present. No thyromegaly present.  Cardiovascular: Normal rate, regular rhythm, normal heart sounds. Exam reveals no gallop and no friction rub. No murmur heard.  Pulmonary/Chest: Effort normal and breath sounds normal. No stridor. No respiratory distress. She has no wheezes. She has no rales. She exhibits no tenderness.  Abdominal: Soft. Bowel sounds are normal. She exhibits no distension. There is no tenderness. There is no rebound and no guarding.  Musculoskeletal: Normal range of motion. She exhibits Mild edema and no tenderness.  Neurological: She is alert and oriented to person, place, and time. Coordination normal.  Skin: Skin is warm and dry. No rash noted. She is not diaphoretic. No erythema. No pallor.  Psychiatric: She has a normal mood and affect. Her behavior is normal. Judgment and thought content normal.     [id number] sinus rhythm with lateral T wave changes suggestive of ischemia.   ASSESSMENT AND PLAN

## 2015-08-03 NOTE — Progress Notes (Signed)
Pt admitted to 2A [id number] from [place]. Pt admitted to floor and oriented to room. Tele and skin verified with Kristine Garbe, RN. Pts right hand and wrist reddened with petechiae with a hardened spot on the inner arm and a swollen elbow. Pt educated to leave arm elevated. Pts BP elevated with a diastolic pressure of [id number]. Arida notified and an order for 10mg  IV Labetalol ordered however when rechecked after the time for med verification and pyxis stocking the dialstolic pressure had decreased to 65. Pt now resting comfortably in room with no complaints of pain or discomfort and call bell in reach. Nursing will continue to assess.

## 2015-08-04 DIAGNOSIS — I5022 Chronic systolic (congestive) heart failure: Secondary | ICD-10-CM

## 2015-08-04 DIAGNOSIS — I2510 Atherosclerotic heart disease of native coronary artery without angina pectoris: Secondary | ICD-10-CM | POA: Diagnosis not present

## 2015-08-04 LAB — CBC
HEMATOCRIT: 28.5 % — AB (ref 35.0–47.0)
Hemoglobin: 9.6 g/dL — ABNORMAL LOW (ref 12.0–16.0)
MCH: 28 pg (ref 26.0–34.0)
MCHC: 33.7 g/dL (ref 32.0–36.0)
MCV: 82.9 fL (ref 80.0–100.0)
PLATELETS: 307 10*3/uL (ref 150–440)
RBC: 3.44 MIL/uL — AB (ref 3.80–5.20)
RDW: 13.1 % (ref 11.5–14.5)
WBC: 6.5 10*3/uL (ref 3.6–11.0)

## 2015-08-04 LAB — BASIC METABOLIC PANEL
ANION GAP: 10 (ref 5–15)
BUN: 40 mg/dL — AB (ref 6–20)
CO2: 27 mmol/L (ref 22–32)
Calcium: 8.9 mg/dL (ref 8.9–10.3)
Chloride: 103 mmol/L (ref 101–111)
Creatinine, Ser: 1.32 mg/dL — ABNORMAL HIGH (ref 0.44–1.00)
GFR calc Af Amer: 48 mL/min — ABNORMAL LOW (ref >60)
GFR, EST NON AFRICAN AMERICAN: 42 mL/min — AB (ref >60)
GLUCOSE: 112 mg/dL — AB (ref 65–99)
POTASSIUM: 3.8 mmol/L (ref 3.5–5.1)
Sodium: 140 mmol/L (ref 135–145)

## 2015-08-04 LAB — GLUCOSE, CAPILLARY: GLUCOSE-CAPILLARY: 132 mg/dL — AB (ref 65–99)

## 2015-08-04 MED ORDER — FUROSEMIDE 40 MG PO TABS: 40.0000 mg | ORAL_TABLET | Freq: Every day | ORAL | Status: DC

## 2015-08-04 MED ORDER — FUROSEMIDE 40 MG PO TABS
40.0000 mg | ORAL_TABLET | Freq: Every day | ORAL | Status: DC
  Administered 2015-08-04: 40 mg via ORAL
  Filled 2015-08-04: qty 1

## 2015-08-04 NOTE — Discharge Instructions (Signed)
**  PLEASE REMEMBER TO BRING ALL OF YOUR MEDICATIONS TO EACH OF YOUR FOLLOW-UP OFFICE VISITS.  Radial Site Care Refer to this sheet in the next few weeks. These instructions provide you with information on caring for yourself after your procedure. Your caregiver may also give you more specific instructions. Your treatment has been planned according to current medical practices, but problems sometimes occur. Call your caregiver if you have any problems or questions after your procedure. HOME CARE INSTRUCTIONS  You may shower the day after the procedure.Remove the bandage (dressing) and gently wash the site with plain soap and water.Gently pat the site dry.   Do not apply powder or lotion to the site.   Do not submerge the affected site in water for 3 to 5 days.   Inspect the site at least twice daily.   Do not flex or bend the affected arm for 24 hours.   No lifting over 5 pounds (2.3 kg) for 5 days after your procedure.   Do not drive home if you are discharged the same day of the procedure. Have someone else drive you.   What to expect:  Any bruising will usually fade within 1 to 2 weeks.   Blood that collects in the tissue (hematoma) may be painful to the touch. It should usually decrease in size and tenderness within 1 to 2 weeks.  SEEK IMMEDIATE MEDICAL CARE IF:  You have unusual pain at the radial site.   You have redness, warmth, swelling, or pain at the radial site.   You have drainage (other than a small amount of blood on the dressing).   You have chills.   You have a fever or persistent symptoms for more than 72 hours.   You have a fever and your symptoms suddenly get worse.   Your arm becomes pale, cool, tingly, or numb.   You have heavy bleeding from the site. Hold pressure on the site.

## 2015-08-04 NOTE — Progress Notes (Signed)
Pt discharged to home via wc.  Instructions  given to pt.  Questions answered.  No distress.  

## 2015-08-04 NOTE — Discharge Summary (Signed)
Discharge Summary   Patient ID: Theresa Rogers,  MRN: [id number], DOB/AGE: 1951-04-21 65 y.o.  Admit date: 08/03/2015 Discharge date: 08/04/2015  Primary Care Provider: Pcp Not In System Primary Cardiologist: M. Fletcher Anon, MD   Discharge Diagnoses    Principal Problem:   CAD (coronary artery disease)  **Attempted PCI of the RCA this admission  aborted 2/2 radial artery injury.  Active Problems:   Radial artery injury   HTN (hypertension)   PAF (paroxysmal atrial fibrillation) ([facility])   Chronic systolic CHF (congestive heart failure) ([facility])   Ischemic cardiomyopathy   Chronic kidney disease, stage III (moderate)   Diabetes ([facility])   HLD (hyperlipidemia)   Allergies Allergies  Allergen Reactions  . Hydralazine Hcl Swelling    Diagnostic Studies/Procedures    Attempted Percutaneous Coronary Intervention 2.23.2017  Aborted RCA PCI due to right radial artery perforation which was treated with stopping anticoagulation and manual pressure.  Recommendations: Continue to observe clinically overnight. If there is worsening, the plan is to proceed with angiography via the femoral approach. _____________   History of Present Illness  65 y/o ? with ah h/o CAD s/p NSTEMI in 2009 with more recent NSTEMI in 06/2015.  She also has a h/o HTN, DM, ICM, CKD III, HL, and PAF.  In 06/2015, she suffered a NSTEMI and underwent PCI/DES to the LCX.  She had residual RCA disease and arrangements were made for staged PCI.  Hospital Course   Consultants: None   Patient presented to the [facility] on 2/23 for PCI of the RCA.  Upon gaining access, the interventional team had trouble advancing the guidewire and angiography revealed right radial artery perforation.  Anticoagulation was discontinued and manual pressure was applied to the area.  Vascular surgery was consulted and it was not felt that any acute surgical intervention was required.  Hemostasis was achieved with compression of the left forearm  and wrist and resulted in reduction of hematoma size.  Distal pulses were intact.  This morning, Ms. Denn is without complaint.  Her right radial site and forearm are soft without bleeding or bruit.  She has strong distal pulses.  She will be discharged home today in good condition and we have arranged for f/u next week with Dr. Fletcher Anon.  Provided stability, we will arrange for outpt PCI of the RCA via the femoral approach in the near future. _____________  Discharge Vitals Blood pressure 158/63, pulse 58, temperature 98.4 F (36.9 C), temperature source Oral, resp. rate 16, height 5\' 4"  (1.626 m), weight 163 lb (73.936 kg), SpO2 94 %.  Filed Weights   08/03/15 0755  Weight: 163 lb (73.936 kg)    Labs     CBC  Recent Labs  08/02/15 1432 08/04/15 0527  WBC 6.9 6.5  HGB 10.7* 9.6*  HCT 32.3* 28.5*  MCV 84.8 82.9  PLT 349 [id number]   Basic Metabolic Panel  Recent Labs  08/02/15 1432 08/04/15 0527  NA 138 140  K 4.5 3.8  CL 97* 103  CO2 30 27  GLUCOSE 374* 112*  BUN 49* 40*  CREATININE 1.89* 1.32*  CALCIUM 8.8* 8.9    Disposition   Pt is being discharged home today in good condition.  Follow-up Plans & Appointments        Follow-up Information    Follow up with Kathlyn Sacramento, MD On 08/11/2015.   Specialty:  Cardiology   Why:  11:30 AM   Contact information:   [address]  [place] [address] [phone]        Discharge Medications     Medication List    TAKE these medications        aspirin 81 MG EC tablet  Take 1 tablet (81 mg total) by mouth daily.     atorvastatin 40 MG tablet  Commonly known as:  LIPITOR  Take 1 tablet (40 mg total) by mouth daily at 6 PM.     clopidogrel 75 MG tablet  Commonly known as:  PLAVIX  Take 1 tablet (75 mg total) by mouth daily.     furosemide 40 MG tablet  Commonly known as:  LASIX  Take 1 tablet (40 mg total) by mouth daily.     insulin glargine 100 UNIT/ML injection  Commonly known  as:  LANTUS  Inject 20 Units into the skin at bedtime.     insulin lispro 100 UNIT/ML injection  Commonly known as:  HUMALOG  Inject 8-12 Units into the skin 3 (three) times daily before meals.     isosorbide mononitrate 30 MG 24 hr tablet  Commonly known as:  IMDUR  Take 1 tablet (30 mg total) by mouth 2 (two) times daily.     Metoprolol Tartrate 75 MG Tabs  Take 75 mg by mouth 2 (two) times daily.         Outstanding Labs/Studies   F/U CBC as outpt.  Duration of Discharge Encounter   Greater than 30 minutes including physician time.  Signed, Murray Hodgkins NP 08/04/2015, 9:51 AM

## 2015-08-04 NOTE — Progress Notes (Signed)
Patient Name: Theresa Rogers Date of Encounter: 08/04/2015  Hospital Problem List     Principal Problem:   CAD (coronary artery disease) Active Problems:   Radial artery injury   HTN (hypertension)   PAF (paroxysmal atrial fibrillation) (H[facility])   Chronic systolic CHF (congestive heart failure) (H[facility])   Ischemic cardiomyopathy   Chronic kidney disease, stage III (moderate)   Diabetes (HCC)   HLD (hyperlipidemia)    Subjective   Doing well this AM.  No c/p or sob.  R wrist w/ petechiae but no hematoma.  Eager to go home.  Inpatient Medications    . aspirin EC  81 mg Oral Daily  . atorvastatin  40 mg Oral q1800  . clopidogrel  75 mg Oral Daily  . furosemide  40 mg Oral Daily  . insulin aspart  0-15 Units Subcutaneous TID WC  . insulin aspart  0-5 Units Subcutaneous QHS  . insulin glargine  20 Units Subcutaneous QHS  . isosorbide mononitrate  30 mg Oral BID  . labetalol  10 mg Intravenous Once  . metoprolol tartrate  75 mg Oral BID  . sodium chloride flush  3 mL Intravenous Q12H    Vital Signs    Filed Vitals:   08/03/15 1404 08/03/15 1928 08/04/15 0511 08/04/15 0746  BP: 161/65 156/67 149/63 158/63  Pulse: 71 70 59 58  Temp:  98.7 F (37.1 C) 98.2 F (36.8 C) 98.4 F (36.9 C)  TempSrc:  Oral Oral Oral  Resp:  16 16 16   Height:      Weight:      SpO2:  95% 96% 94%    Intake/Output Summary (Last 24 hours) at 08/04/15 0751 Last data filed at 08/04/15 0513  Gross per 24 hour  Intake 1002.5 ml  Output    500 ml  Net  502.5 ml   Filed Weights   08/03/15 0755  Weight: 163 lb (73.936 kg)    Physical Exam    General: Pleasant, NAD. Neuro: Alert and oriented X 3. Moves all extremities spontaneously. Psych: Normal affect. HEENT:  Normal  Neck: Supple without bruits or JVD. Lungs:  Resp regular and unlabored, CTA. Heart: RRR no s3, s4, or murmurs. Abdomen: Soft, non-tender, non-distended, BS + x 4.  Extremities: No clubbing, cyanosis or edema.  DP/PT/Radials 2+ and equal bilaterally.  Right wrist petechiae w/o bleeding or bruit.  Labs    CBC  Recent Labs  08/02/15 1432 08/04/15 0527  WBC 6.9 6.5  HGB 10.7* 9.6*  HCT 32.3* 28.5*  MCV 84.8 82.9  PLT 349 [id number]   Basic Metabolic Panel  Recent Labs  08/02/15 1432 08/04/15 0527  NA 138 140  K 4.5 3.8  CL 97* 103  CO2 30 27  GLUCOSE 374* 112*  BUN 49* 40*  CREATININE 1.89* 1.32*  CALCIUM 8.8* 8.9    Telemetry    RSR  Radiology    No results found.  Assessment & Plan    1.  CAD:  S/p recent NSTEMI w/ PCI/DES to mid LCX in January.  Presented for staged PCI of the RCA yesterday, complicated by right radial injury in abortion of the procedure.  Cont asa, statin, plavix, bb, nitrate.  Plan d/c home today with f/u in the office in a week to reassess right wrist and ensure stability.  Reschedule PCI of the RCA @ that time.  2.  Right Radial Injury r/t cath:  H/H down slightly.  Wrist/forearm look good - soft.  No hematoma/bruit/bleeding.  Good pulses and cap refill.  3.  Hypertensive Heart Disease:  BP trending 140's to 160's.  Ideally would add acei/arb but with CKD (creat 1.89 on 2/22) now is not ideal time.  Has intolerance listed to hydralazine.  Will titrate imdur for now.  4.  HL:  LDL 149 in January @ time of event.  On lipitor 40.  Will need f/u lipids/lft's as outpt in a few wks.  5.  Chronic systolic CHF/ICM:  EF [id number] 06/2015.  + 279ml since yesterday.  Euvolemic on exam.  Cont bb.  No acei/arb/arni/spiro 2/2 elevated creat on 2/22.  Will follow as outpt and plan to add at some point.   6.  Type II DM: glucose stable this AM.  Cont lantus.  7.  CKD III:  Creat stable this AM.  Signed, Murray Hodgkins NP

## 2015-08-07 ENCOUNTER — Other Ambulatory Visit: Payer: Self-pay | Admitting: *Deleted

## 2015-08-07 ENCOUNTER — Other Ambulatory Visit: Payer: Self-pay

## 2015-08-07 MED ORDER — CLOPIDOGREL BISULFATE 75 MG PO TABS: 75.0000 mg | ORAL_TABLET | Freq: Every day | ORAL | Status: DC

## 2015-08-11 ENCOUNTER — Ambulatory Visit (INDEPENDENT_AMBULATORY_CARE_PROVIDER_SITE_OTHER): Payer: Self-pay | Admitting: Cardiovascular Disease

## 2015-08-11 ENCOUNTER — Encounter: Payer: Self-pay | Admitting: Cardiovascular Disease

## 2015-08-11 VITALS — BP 150/78 | HR 58 | Ht 64.0 in | Wt 150.0 lb

## 2015-08-11 DIAGNOSIS — I5022 Chronic systolic (congestive) heart failure: Secondary | ICD-10-CM

## 2015-08-11 DIAGNOSIS — I2510 Atherosclerotic heart disease of native coronary artery without angina pectoris: Secondary | ICD-10-CM

## 2015-08-11 NOTE — Progress Notes (Signed)
Cardiology Office Note   Date:  08/11/2015   ID:  Theresa Rogers, DOB 03-Oct-1950, MRN [id number]  PCP:  Pcp Not In System  Cardiologist:   Kathlyn Sacramento, MD  Nephrologist: Dr. Rolly Salter  Chief Complaint  Patient presents with  . other    Follow up from [facility] s/p cardiac cath. Medications reviewed by the patient verbally. "doing well."       History of Present Illness: Theresa Rogers is a 65 y.o. female who presents for for a follow-up visit regarding coronary artery disease and chronic systolic heart failure. She has known history of history of CAD (PCI in 2009, NSTEMI w/ DES to Mid-LCx in 06/2015), Type 2 DM, Ischemic Cardiomyopathy (EF 30-35% by Echo 06/2015), PAF (new onset 06/2015), Stage 3 CKD, HTN, HLD, and Anemia.  She was hospitalized at [facility] in January for acute systolic heart failure and non-ST elevation myocardial infarction. Echocardiogram showed an ejection fraction of 30-35%. She was noted to have elevated creatinine on presentation. She ultimately underwent cardiac catheterization which showed 70% stenosis in the mid LAD, 80% stenosis in the mid left circumflex and multiple 80% lesions in the right coronary artery. She underwent staged revascularization after a few days with FFR guidance. LAD FFR was not significant and thus PCI was deferred. Left circumflex PCI and drug-eluting stent placement was performed without complications. She does have chronic kidney disease and anemia. She has been maintaining in sinus rhythm and we elected not to anticoagulate especially in the setting of dual antiplatelet therapy. The plan was to proceed with staged RCA PCI which was attempted last week. However, it was complicated by perforation of the right radial artery after attempting to advance the guiding catheter. This complication was actually managed conservatively with external pressure and did not require any surgery or other procedures. Her pulses were intact with no significant  discomfort. She has done well since then. She denies any chest discomfort or shortness of breath. She was started on losartan by Dr. Rolly Salter 2 days ago.     Past Medical History  Diagnosis Date  . CAD (coronary artery disease)     a. s/p MI/syncope in 2009 s/p remote stenting; b. NSTEMI 06/2015, cardiac cath 06/2015 pLAD 70% FFR 0.88 not clinically sig, dLAD 90%, mLCx 80% s/p PCI/DES 0%, pRCA 70%, mRCA 80%, dRCA 80%  . Diabetes mellitus without complication ([place])   . Ischemic cardiomyopathy     a. echo 06/2015: EF 30-35%, diffuse HK, nable to exclude RWMA, LV diastolic function parameters were normal, mild MR, left atrium was mildly dilated, PASP 28 mm Hg, IVC was dilated c/w elevated CVP  . Chronic systolic CHF (congestive heart failure) ([place])   . PAF (paroxysmal atrial fibrillation) ([place])     a. new onset 06/2015; b. CHADS2VASc = 5 (CHF, HTN, age x 1, vascular dz, female); c. not on long term full dose anticoagulation until 07/2015 in an effort to avoid triple therapy given her anemia   . CKD (chronic kidney disease), stage III   . Anemia   . HTN (hypertension)   . HLD (hyperlipidemia)     Past Surgical History  Procedure Laterality Date  . Coronary angioplasty with stent placement    . Cardiac catheterization N/A 06/22/2015    Procedure: Coronary Angiogram;  Surgeon: Minna Merritts, MD;  Location: [place] [facility];  Service: Cardiovascular;  Laterality: N/A;  . Cardiac catheterization N/A 06/26/2015    Procedure: Coronary Stent Intervention;  Surgeon: Wellington Hampshire, MD;  Location: [place] [facility];  Service: Cardiovascular;  Laterality: N/A;  . Cardiac catheterization N/A 08/03/2015    Procedure: Coronary Stent Intervention;  Surgeon: Wellington Hampshire, MD;  Location: [place] [facility];  Service: Cardiovascular;  Laterality: N/A;     Current Outpatient Prescriptions  Medication Sig Dispense Refill  . aspirin EC 81 MG EC tablet Take 1 tablet (81 mg total) by mouth daily.     Marland Kitchen atorvastatin (LIPITOR) 40 MG tablet Take 1 tablet (40 mg total) by mouth daily at 6 PM. 30 tablet 0  . clopidogrel (PLAVIX) 75 MG tablet Take 1 tablet (75 mg total) by mouth daily. 30 tablet 3  . furosemide (LASIX) 40 MG tablet Take 1 tablet (40 mg total) by mouth daily. 30 tablet 6  . insulin glargine (LANTUS) 100 UNIT/ML injection Inject 20 Units into the skin at bedtime.    . insulin lispro (HUMALOG) 100 UNIT/ML injection Inject 8-12 Units into the skin 3 (three) times daily before meals.    . isosorbide mononitrate (IMDUR) 30 MG 24 hr tablet Take 1 tablet (30 mg total) by mouth 2 (two) times daily. 60 tablet 0  . losartan (COZAAR) 50 MG tablet Take 50 mg by mouth daily.    . Metoprolol Tartrate 75 MG TABS Take 75 mg by mouth 2 (two) times daily. 60 tablet 0   No current facility-administered medications for this visit.    Allergies:   Hydralazine hcl    Social History:  The patient  reports that she has never smoked. She has never used smokeless tobacco. She reports that she does not drink alcohol or use illicit drugs.   Family History:  The patient's family history includes Alzheimer's disease in her mother; CAD in her mother; Diabetes in her brother; Kidney failure in her brother; Prostate cancer in her father.       PHYSICAL EXAM: VS:  BP 150/78 mmHg  Pulse 58  Ht 5\' 4"  (1.626 m)  Wt 150 lb (68.04 kg)  BMI 25.73 kg/m2 , BMI Body mass index is 25.73 kg/(m^2). GEN: Well nourished, well developed, in no acute distress HEENT: normal Neck: no JVD, carotid bruits, or masses Cardiac: RRR; no murmurs, rubs, or gallops,no edema  Respiratory:  clear to auscultation bilaterally, normal work of breathing GI: soft, nontender, nondistended, + BS MS: no deformity or atrophy Skin: warm and dry, no rash Neuro:  Strength and sensation are intact Psych: euthymic mood, full affect Right arm: There is superficial bruising extending all the way above the elbow. However, no significant  swelling. Radial pulse is normal. Ulnar pulse is diminished but palpable.   EKG:  EKG is not ordered today.    Recent Labs: 06/19/2015: ALT 60*; B Natriuretic Peptide 1782.0*; TSH 8.859* 08/04/2015: BUN 40*; Creatinine, Ser 1.32*; Hemoglobin 9.6*; Platelets 307; Potassium 3.8; Sodium 140    Lipid Panel    Component Value Date/Time   CHOL 222* 06/21/2015 1329   TRIG 162* 06/21/2015 1329   HDL 41 06/21/2015 1329   CHOLHDL 5.4 06/21/2015 1329   VLDL 32 06/21/2015 1329   LDLCALC 149* 06/21/2015 1329      Wt Readings from Last 3 Encounters:  08/11/15 150 lb (68.04 kg)  08/03/15 163 lb (73.936 kg)  07/20/15 163 lb (73.936 kg)         ASSESSMENT AND PLAN:  1.  Coronary artery disease involving native coronary arteries without angina: The patient continues to have significant residual disease in the right coronary artery. I  again discussed with her re-attempting PCI versus continued medical therapy. The vessel is diffusely diseased in the proximal, mid and distal segment. Thus, it will probably requires at least 2 stents and most likely 3 stents. The other option would be to treat this medically and even if the vessel closes, she will likely develop left to right collaterals. Given that currently she is not having anginal symptoms that have heart failure symptoms has improved significantly, I'm going to continue with medical therapy.  2. Chronic systolic heart failure: Ejection fraction was 30-35%. We recently decreased the dose of furosemide to 40 mg once daily. Renal function has improved. She appears to be euvolemic. Continue treatment with metoprolol and losartan. I might consider switching her from metoprolol to carvedilol in the near future.  3. Paroxysmal atrial fibrillation: If she develops any recurrent episodes, I'm planning to start anticoagulation. For now, continue dual antiplatelet therapy given recent drug-eluting stent placement in January.  4. Hyperlipidemia: Continue  treatment with atorvastatin with a target LDL of less than 70. This will require to be checked.  5. Chronic kidney disease: Followed by nephrology.      Disposition:   FU with me in 3 months  Signed, Kathlyn Sacramento, MD  08/11/2015 1:24 PM    [facility]

## 2015-08-11 NOTE — Patient Instructions (Signed)
Medication Instructions: Continue same medications.   Labwork: None.   Procedures/Testing: None.   Follow-Up: 3 months with Dr. Arida.   Any Additional Special Instructions Will Be Listed Below (If Applicable).     If you need a refill on your cardiac medications before your next appointment, please call your pharmacy.   

## 2015-08-17 ENCOUNTER — Ambulatory Visit: Payer: Medicaid Other | Attending: Family | Admitting: Family

## 2015-08-17 ENCOUNTER — Encounter: Payer: Self-pay | Admitting: Family

## 2015-08-17 VITALS — BP 160/80 | HR 59 | Resp 18 | Ht 64.0 in | Wt 148.0 lb

## 2015-08-17 DIAGNOSIS — Z7982 Long term (current) use of aspirin: Secondary | ICD-10-CM | POA: Diagnosis not present

## 2015-08-17 DIAGNOSIS — I255 Ischemic cardiomyopathy: Secondary | ICD-10-CM | POA: Insufficient documentation

## 2015-08-17 DIAGNOSIS — E785 Hyperlipidemia, unspecified: Secondary | ICD-10-CM | POA: Diagnosis not present

## 2015-08-17 DIAGNOSIS — I2510 Atherosclerotic heart disease of native coronary artery without angina pectoris: Secondary | ICD-10-CM | POA: Diagnosis not present

## 2015-08-17 DIAGNOSIS — N183 Chronic kidney disease, stage 3 unspecified: Secondary | ICD-10-CM

## 2015-08-17 DIAGNOSIS — I129 Hypertensive chronic kidney disease with stage 1 through stage 4 chronic kidney disease, or unspecified chronic kidney disease: Secondary | ICD-10-CM | POA: Diagnosis not present

## 2015-08-17 DIAGNOSIS — Z794 Long term (current) use of insulin: Secondary | ICD-10-CM | POA: Diagnosis not present

## 2015-08-17 DIAGNOSIS — I5022 Chronic systolic (congestive) heart failure: Secondary | ICD-10-CM | POA: Insufficient documentation

## 2015-08-17 DIAGNOSIS — I252 Old myocardial infarction: Secondary | ICD-10-CM | POA: Diagnosis not present

## 2015-08-17 DIAGNOSIS — Z9889 Other specified postprocedural states: Secondary | ICD-10-CM | POA: Diagnosis not present

## 2015-08-17 DIAGNOSIS — I10 Essential (primary) hypertension: Secondary | ICD-10-CM

## 2015-08-17 DIAGNOSIS — I480 Paroxysmal atrial fibrillation: Secondary | ICD-10-CM | POA: Diagnosis not present

## 2015-08-17 DIAGNOSIS — Z79899 Other long term (current) drug therapy: Secondary | ICD-10-CM | POA: Diagnosis not present

## 2015-08-17 DIAGNOSIS — E1122 Type 2 diabetes mellitus with diabetic chronic kidney disease: Secondary | ICD-10-CM | POA: Insufficient documentation

## 2015-08-17 NOTE — Patient Instructions (Signed)
Continue weighing daily and call for an overnight weight gain of > 2 pounds or a weekly weight gain of >5 pounds. 

## 2015-08-17 NOTE — Progress Notes (Signed)
Subjective:    Patient ID: Theresa Rogers, female    DOB: Mar 29, 1951, 65 y.o.   MRN: [id number]  Congestive Heart Failure Presents for follow-up visit. The disease course has been improving. Pertinent negatives include no abdominal pain, chest pain, edema, fatigue, orthopnea, palpitations or shortness of breath. The symptoms have been improving. Past treatments include angiotensin receptor blockers, beta blockers and salt and fluid restriction. The treatment provided significant relief. Compliance with prior treatments has been good. Her past medical history is significant for anemia, arrhythmia, chronic lung disease, DM and HTN. She has one 1st degree relative with heart disease.  Hypertension This is a chronic problem. The current episode started more than 1 year ago. The problem has been waxing and waning since onset. Pertinent negatives include no chest pain, headaches, malaise/fatigue, palpitations, peripheral edema or shortness of breath. There are no associated agents to hypertension. Risk factors for coronary artery disease include diabetes mellitus, post-menopausal state and family history. Past treatments include angiotensin blockers, beta blockers, diuretics and lifestyle changes. The current treatment provides significant improvement. There are no compliance problems.  Hypertensive end-organ damage includes kidney disease and heart failure.   Past Medical History  Diagnosis Date  . CAD (coronary artery disease)     a. s/p MI/syncope in 2009 s/p remote stenting; b. NSTEMI 06/2015, cardiac cath 06/2015 pLAD 70% FFR 0.88 not clinically sig, dLAD 90%, mLCx 80% s/p PCI/DES 0%, pRCA 70%, mRCA 80%, dRCA 80%  . Diabetes mellitus without complication ([place])   . Ischemic cardiomyopathy     a. echo 06/2015: EF 30-35%, diffuse HK, nable to exclude RWMA, LV diastolic function parameters were normal, mild MR, left atrium was mildly dilated, PASP 28 mm Hg, IVC was dilated c/w elevated CVP  . Chronic  systolic CHF (congestive heart failure) ([place])   . PAF (paroxysmal atrial fibrillation) ([place])     a. new onset 06/2015; b. CHADS2VASc = 5 (CHF, HTN, age x 1, vascular dz, female); c. not on long term full dose anticoagulation until 07/2015 in an effort to avoid triple therapy given her anemia   . CKD (chronic kidney disease), stage III   . Anemia   . HTN (hypertension)   . HLD (hyperlipidemia)     Past Surgical History  Procedure Laterality Date  . Coronary angioplasty with stent placement    . Cardiac catheterization N/A 06/22/2015    Procedure: Coronary Angiogram;  Surgeon: Minna Merritts, MD;  Location: [place] [facility];  Service: Cardiovascular;  Laterality: N/A;  . Cardiac catheterization N/A 06/26/2015    Procedure: Coronary Stent Intervention;  Surgeon: Wellington Hampshire, MD;  Location: [place] [facility];  Service: Cardiovascular;  Laterality: N/A;  . Cardiac catheterization N/A 08/03/2015    Procedure: Coronary Stent Intervention;  Surgeon: Wellington Hampshire, MD;  Location: [place] [facility];  Service: Cardiovascular;  Laterality: N/A;    Family History  Problem Relation Age of Onset  . CAD Mother   . Alzheimer's disease Mother   . Prostate cancer Father   . Diabetes Brother   . Kidney failure Brother     Social History  Substance Use Topics  . Smoking status: Never Smoker   . Smokeless tobacco: Never Used  . Alcohol Use: No    Allergies  Allergen Reactions  . Hydralazine Hcl Swelling    Prior to Admission medications   Medication Sig Start Date End Date Taking? Authorizing Provider  aspirin EC 81 MG EC tablet Take 1 tablet (81  mg total) by mouth daily. 06/27/15  Yes Srikar Sudini, MD  atorvastatin (LIPITOR) 40 MG tablet Take 1 tablet (40 mg total) by mouth daily at 6 PM. 06/27/15  Yes Srikar Sudini, MD  clopidogrel (PLAVIX) 75 MG tablet Take 1 tablet (75 mg total) by mouth daily. 08/07/15  Yes Wellington Hampshire, MD  furosemide (LASIX) 40 MG tablet Take 1 tablet  (40 mg total) by mouth daily. 08/04/15  Yes Rogelia Mire, NP  insulin glargine (LANTUS) 100 UNIT/ML injection Inject 20 Units into the skin at bedtime.   Yes Historical Provider, MD  insulin lispro (HUMALOG) 100 UNIT/ML injection Inject 8-12 Units into the skin 3 (three) times daily before meals.   Yes Historical Provider, MD  isosorbide mononitrate (IMDUR) 30 MG 24 hr tablet Take 1 tablet (30 mg total) by mouth 2 (two) times daily. 06/27/15  Yes Srikar Sudini, MD  losartan (COZAAR) 50 MG tablet Take 50 mg by mouth daily.   Yes Historical Provider, MD  Metoprolol Tartrate 75 MG TABS Take 75 mg by mouth 2 (two) times daily. 06/27/15  Yes Hillary Bow, MD      Review of Systems  Constitutional: Negative for malaise/fatigue, appetite change and fatigue.  HENT: Negative for congestion, postnasal drip and sore throat.   Eyes: Negative.   Respiratory: Negative for cough, chest tightness and shortness of breath.   Cardiovascular: Negative for chest pain, palpitations and leg swelling.  Gastrointestinal: Negative for abdominal pain and abdominal distention.  Endocrine: Negative.   Genitourinary: Negative.   Musculoskeletal: Negative for back pain and gait problem.  Skin: Negative.   Allergic/Immunologic: Negative.   Neurological: Negative for dizziness, light-headedness and headaches.  Hematological: Negative for adenopathy. Bruises/bleeds easily.  Psychiatric/Behavioral: Negative for sleep disturbance (sleeping on 1 pillow) and dysphoric mood. The patient is not nervous/anxious.        Objective:   Physical Exam  Constitutional: She is oriented to person, place, and time. She appears well-developed and well-nourished.  HENT:  Head: Normocephalic and atraumatic.  Eyes: Conjunctivae are normal. Pupils are equal, round, and reactive to light.  Neck: Normal range of motion. Neck supple.  Cardiovascular: An irregular rhythm present. Bradycardia present.   Pulmonary/Chest: Effort normal.  She has no wheezes. She has no rales.  Abdominal: Soft. She exhibits no distension. There is no tenderness.  Musculoskeletal: She exhibits no edema or tenderness.  Neurological: She is alert and oriented to person, place, and time.  Skin: Skin is warm and dry.  Psychiatric: She has a normal mood and affect. Her behavior is normal. Thought content normal.  Nursing note and vitals reviewed.   BP 160/80 mmHg  Pulse 59  Resp 18  Ht 5\' 4"  (1.626 m)  Wt 148 lb (67.132 kg)  BMI 25.39 kg/m2  SpO2 100%       Assessment & Plan:  1: Chronic heart failure with reduced ejection fraction- Patient is currently symptom free at this time. She continues to weigh herself daily and says that she's had a gradual weight loss as the fluid has come off. By our scale, she's lost 15 pounds in the last month. She is not adding salt to her food and says that she tries to follow a low sodium diet. Is quite active and babysits often for her 15 yr grandson and 2 yr old twin grandchildren. Hasn't taken her diuretic in the last 4 days because she doesn't have any more swelling. Sees her cardiologist June 2017. 2: HTN- Blood pressure elevated  although it was better after a recheck. She's had losartan started by her nephrologist since she was here last. Encouraged her to take her diuretic and explained how the diuretic can also treat high blood pressure. Instructed her to take her diuretic for the next 2 days and monitor her blood pressure. Should it remain elevated, she's to call her nephrologist.  3: Diabetes- She continues to take her insulin and follows with her PCP.   Return here in 4 months or sooner for any questions/problems before then.

## 2015-08-25 ENCOUNTER — Ambulatory Visit: Payer: Self-pay | Admitting: Cardiovascular Disease

## 2015-11-10 ENCOUNTER — Ambulatory Visit: Payer: Self-pay | Admitting: Cardiovascular Disease

## 2015-12-18 ENCOUNTER — Ambulatory Visit: Payer: Self-pay | Admitting: Family

## 2015-12-25 ENCOUNTER — Ambulatory Visit: Payer: Self-pay | Admitting: Cardiovascular Disease

## 2016-04-29 ENCOUNTER — Ambulatory Visit: Payer: Medicaid Other

## 2016-04-29 ENCOUNTER — Encounter: Payer: Self-pay | Admitting: Emergency Medicine

## 2016-04-29 ENCOUNTER — Ambulatory Visit
Admission: EM | Admit: 2016-04-29 | Discharge: 2016-04-29 | Disposition: A | Discharge status: Home / Self Care | Payer: Medicaid Other | Attending: Family Medicine | Admitting: Family Medicine

## 2016-04-29 ENCOUNTER — Emergency Department
Admission: EM | Admit: 2016-04-29 | Discharge: 2016-04-29 | Disposition: A | Discharge status: Home / Self Care | Payer: Medicaid Other | Attending: Emergency Medicine | Admitting: Emergency Medicine

## 2016-04-29 DIAGNOSIS — N183 Chronic kidney disease, stage 3 (moderate): Secondary | ICD-10-CM | POA: Diagnosis not present

## 2016-04-29 DIAGNOSIS — I2510 Atherosclerotic heart disease of native coronary artery without angina pectoris: Secondary | ICD-10-CM | POA: Diagnosis not present

## 2016-04-29 DIAGNOSIS — I130 Hypertensive heart and chronic kidney disease with heart failure and stage 1 through stage 4 chronic kidney disease, or unspecified chronic kidney disease: Secondary | ICD-10-CM | POA: Insufficient documentation

## 2016-04-29 DIAGNOSIS — R05 Cough: Secondary | ICD-10-CM | POA: Diagnosis present

## 2016-04-29 DIAGNOSIS — I5023 Acute on chronic systolic (congestive) heart failure: Secondary | ICD-10-CM | POA: Insufficient documentation

## 2016-04-29 DIAGNOSIS — R0602 Shortness of breath: Secondary | ICD-10-CM | POA: Diagnosis present

## 2016-04-29 DIAGNOSIS — I509 Heart failure, unspecified: Secondary | ICD-10-CM | POA: Diagnosis not present

## 2016-04-29 DIAGNOSIS — J189 Pneumonia, unspecified organism: Secondary | ICD-10-CM | POA: Diagnosis not present

## 2016-04-29 DIAGNOSIS — Z79899 Other long term (current) drug therapy: Secondary | ICD-10-CM | POA: Insufficient documentation

## 2016-04-29 DIAGNOSIS — Z7982 Long term (current) use of aspirin: Secondary | ICD-10-CM | POA: Insufficient documentation

## 2016-04-29 DIAGNOSIS — E1122 Type 2 diabetes mellitus with diabetic chronic kidney disease: Secondary | ICD-10-CM | POA: Diagnosis not present

## 2016-04-29 DIAGNOSIS — Z9861 Coronary angioplasty status: Secondary | ICD-10-CM | POA: Insufficient documentation

## 2016-04-29 DIAGNOSIS — Z794 Long term (current) use of insulin: Secondary | ICD-10-CM | POA: Diagnosis not present

## 2016-04-29 DIAGNOSIS — Z7902 Long term (current) use of antithrombotics/antiplatelets: Secondary | ICD-10-CM | POA: Insufficient documentation

## 2016-04-29 LAB — CBC WITH DIFFERENTIAL/PLATELET
BASOS ABS: 0.1 10*3/uL (ref 0–0.1)
Basophils Relative: 1 %
Eosinophils Absolute: 0.1 10*3/uL (ref 0–0.7)
Eosinophils Relative: 1 %
HEMATOCRIT: 37.9 % (ref 35.0–47.0)
Hemoglobin: 12.2 g/dL (ref 12.0–16.0)
LYMPHS ABS: 0.6 10*3/uL — AB (ref 1.0–3.6)
LYMPHS PCT: 12 %
MCH: 26.9 pg (ref 26.0–34.0)
MCHC: 32.1 g/dL (ref 32.0–36.0)
MCV: 83.7 fL (ref 80.0–100.0)
MONO ABS: 0.7 10*3/uL (ref 0.2–0.9)
MONOS PCT: 14 %
NEUTROS ABS: 3.6 10*3/uL (ref 1.4–6.5)
Neutrophils Relative %: 72 %
Platelets: 266 10*3/uL (ref 150–440)
RBC: 4.53 MIL/uL (ref 3.80–5.20)
RDW: 15.9 % — AB (ref 11.5–14.5)
WBC: 5.1 10*3/uL (ref 3.6–11.0)

## 2016-04-29 LAB — BASIC METABOLIC PANEL
ANION GAP: 12 (ref 5–15)
BUN: 45 mg/dL — ABNORMAL HIGH (ref 6–20)
CALCIUM: 8.6 mg/dL — AB (ref 8.9–10.3)
CO2: 24 mmol/L (ref 22–32)
Chloride: 98 mmol/L — ABNORMAL LOW (ref 101–111)
Creatinine, Ser: 1.69 mg/dL — ABNORMAL HIGH (ref 0.44–1.00)
GFR calc Af Amer: 36 mL/min — ABNORMAL LOW (ref >60)
GFR calc non Af Amer: 31 mL/min — ABNORMAL LOW (ref >60)
GLUCOSE: 252 mg/dL — AB (ref 65–99)
Potassium: 4.8 mmol/L (ref 3.5–5.1)
Sodium: 134 mmol/L — ABNORMAL LOW (ref 135–145)

## 2016-04-29 MED ORDER — GUAIFENESIN-CODEINE 100-10 MG/5ML PO SOLN: 5.0000 mL | Freq: Three times a day (TID) | ORAL | 0 refills | Status: DC | PRN

## 2016-04-29 MED ORDER — DOXYCYCLINE HYCLATE 100 MG PO CAPS: 100.0000 mg | ORAL_CAPSULE | Freq: Two times a day (BID) | ORAL | 0 refills | Status: DC

## 2016-04-29 MED ORDER — DOXYCYCLINE HYCLATE 100 MG PO TABS
100.0000 mg | ORAL_TABLET | Freq: Two times a day (BID) | ORAL | Status: DC
  Administered 2016-04-29: 100 mg via ORAL
  Filled 2016-04-29: qty 1

## 2016-04-29 NOTE — ED Triage Notes (Signed)
Pt c/o cough for about a week, and that her legs are swelling bilaterally from the knee up and that she feels bloated.

## 2016-04-29 NOTE — ED Notes (Signed)
Pt states cough, SOB x a few days. Went to PCP earlier today thinking she had pneumonia and wanted antibiotics. Pt states PCP sent her here for IV diuretics. Pt states hx of CHF, states legs have been swollen lately. Pt states she is on lasix. Pt states had blood drawn and x-ray at PCP.

## 2016-04-29 NOTE — ED Provider Notes (Signed)
[facility] Emergency Department Provider Note  ____________________________________________   First MD Initiated Contact with Patient 04/29/16 1533     (approximate)  I have reviewed the triage vital signs and the nursing notes.   HISTORY  Chief Complaint Shortness of Breath   HPI Theresa Rogers is a 65 y.o. female with a history of CAD, CHF who is presenting to the emergency department today with 1 week of cough. Also with intermittent leg swelling which she says gets worse throughout the day but when she wakes in the morning she says it is gone. She has been doubling her Lasix dose over the past week. Denies any shortness of breath with exertion but just says she has shortness of breath after coughing. Denies any fever. Denies any pain. Says that she has had chills but no fever. Was seen in urgent care this morning for concern for CHF and sent to the emergency department for further evaluation.   Past Medical History:  Diagnosis Date  . Anemia   . CAD (coronary artery disease)    a. s/p MI/syncope in 2009 s/p remote stenting; b. NSTEMI 06/2015, cardiac cath 06/2015 pLAD 70% FFR 0.88 not clinically sig, dLAD 90%, mLCx 80% s/p PCI/DES 0%, pRCA 70%, mRCA 80%, dRCA 80%  . Chronic systolic CHF (congestive heart failure) ([place])   . CKD (chronic kidney disease), stage III   . Diabetes mellitus without complication ([place])   . HLD (hyperlipidemia)   . HTN (hypertension)   . Ischemic cardiomyopathy    a. echo 06/2015: EF 30-35%, diffuse HK, nable to exclude RWMA, LV diastolic function parameters were normal, mild MR, left atrium was mildly dilated, PASP 28 mm Hg, IVC was dilated c/w elevated CVP  . PAF (paroxysmal atrial fibrillation) ([place])    a. new onset 06/2015; b. CHADS2VASc = 5 (CHF, HTN, age x 1, vascular dz, female); c. not on long term full dose anticoagulation until 07/2015 in an effort to avoid triple therapy given her anemia     Patient Active Problem List    Diagnosis Date Noted  . Radial artery injury 08/03/2015  . Diabetes ([place]) 07/20/2015  . Chronic kidney disease, stage III (moderate)   . HLD (hyperlipidemia)   . HTN (hypertension)   . Anemia   . CKD (chronic kidney disease), stage III   . PAF (paroxysmal atrial fibrillation) ([place])   . Chronic systolic CHF (congestive heart failure) ([place])   . Ischemic cardiomyopathy   . CAD (coronary artery disease)   . Congestive dilated cardiomyopathy ([place])   . Coronary artery disease involving native coronary artery of native heart without angina pectoris     Past Surgical History:  Procedure Laterality Date  . CARDIAC CATHETERIZATION N/A 06/22/2015   Procedure: Coronary Angiogram;  Surgeon: Minna Merritts, MD;  Location: [place] [facility];  Service: Cardiovascular;  Laterality: N/A;  . CARDIAC CATHETERIZATION N/A 06/26/2015   Procedure: Coronary Stent Intervention;  Surgeon: Wellington Hampshire, MD;  Location: [place] [facility];  Service: Cardiovascular;  Laterality: N/A;  . CARDIAC CATHETERIZATION N/A 08/03/2015   Procedure: Coronary Stent Intervention;  Surgeon: Wellington Hampshire, MD;  Location: [place] [facility];  Service: Cardiovascular;  Laterality: N/A;  . CORONARY ANGIOPLASTY WITH STENT PLACEMENT      Prior to Admission medications   Medication Sig Start Date End Date Taking? Authorizing Provider  aspirin EC 81 MG EC tablet Take 1 tablet (81 mg total) by mouth daily. 06/27/15   Srikar Sudini,  MD  atorvastatin (LIPITOR) 40 MG tablet Take 1 tablet (40 mg total) by mouth daily at 6 PM. 06/27/15   Hillary Bow, MD  clopidogrel (PLAVIX) 75 MG tablet Take 1 tablet (75 mg total) by mouth daily. 08/07/15   Wellington Hampshire, MD  furosemide (LASIX) 40 MG tablet Take 1 tablet (40 mg total) by mouth daily. 08/04/15   Rogelia Mire, NP  insulin glargine (LANTUS) 100 UNIT/ML injection Inject 20 Units into the skin at bedtime.    Historical Provider, MD  insulin lispro (HUMALOG) 100 UNIT/ML  injection Inject 8-12 Units into the skin 3 (three) times daily before meals.    Historical Provider, MD  isosorbide mononitrate (IMDUR) 30 MG 24 hr tablet Take 1 tablet (30 mg total) by mouth 2 (two) times daily. 06/27/15   Srikar Sudini, MD  losartan (COZAAR) 50 MG tablet Take 50 mg by mouth daily.    Historical Provider, MD  Metoprolol Tartrate 75 MG TABS Take 75 mg by mouth 2 (two) times daily. 06/27/15   Hillary Bow, MD    Allergies Hydralazine hcl  Family History  Problem Relation Age of Onset  . CAD Mother   . Alzheimer's disease Mother   . Prostate cancer Father   . Diabetes Brother   . Kidney failure Brother     Social History Social History  Substance Use Topics  . Smoking status: Never Smoker  . Smokeless tobacco: Never Used  . Alcohol use No    Review of Systems Constitutional: No fever/chills Eyes: No visual changes. ENT: No sore throat. Cardiovascular: Denies chest pain. Respiratory: As above Gastrointestinal: No abdominal pain.  No nausea, no vomiting.  No diarrhea.  No constipation. Genitourinary: Negative for dysuria. Musculoskeletal: Negative for back pain. Skin: Negative for rash. Neurological: Negative for headaches, focal weakness or numbness.  10-point ROS otherwise negative.  ____________________________________________   PHYSICAL EXAM:  VITAL SIGNS: ED Triage Vitals  Enc Vitals Group     BP 04/29/16 1224 (!) 159/82     Pulse Rate 04/29/16 1224 80     Resp 04/29/16 1224 16     Temp 04/29/16 1224 98.2 F (36.8 C)     Temp Source 04/29/16 1224 Oral     SpO2 04/29/16 1224 100 %     Weight 04/29/16 1219 140 lb (63.5 kg)     Height 04/29/16 1219 5\' 4"  (1.626 m)     Head Circumference --      Peak Flow --      Pain Score 04/29/16 1219 2     Pain Loc --      Pain Edu? --      Excl. in [place]? --     Constitutional: Alert and oriented. Well appearing and in no acute distress. Eyes: Conjunctivae are normal. PERRL. EOMI. Head:  Atraumatic. Nose: No congestion/rhinnorhea. Mouth/Throat: Mucous membranes are moist.   Neck: No stridor.   Cardiovascular: Normal rate, regular rhythm. Grossly normal heart sounds.  Good peripheral circulation. Respiratory: Normal respiratory effort.  No retractions. Mild rales to the bilateral bases. No wheezing. Coughs in the room but nonproductive. Gastrointestinal: Soft and nontender. No distention. No abdominal bruits. No CVA tenderness. Musculoskeletal: Mild lower extremity edema with the left being greater than the right the patient says is her baseline. Neurologic:  Normal speech and language. No gross focal neurologic deficits are appreciated.  Skin:  Skin is warm, dry and intact. No rash noted. Psychiatric: Mood and affect are normal. Speech and behavior are normal.  ____________________________________________   LABS (all labs ordered are listed, but only abnormal results are displayed)  Labs Reviewed  TROPONIN I  HEPATIC FUNCTION PANEL   ____________________________________________  EKG  ED ECG REPORT I, Harriet Bollen,  Youlanda Roys, the attending physician, personally viewed and interpreted this ECG.   Date: 04/29/2016  EKG Time: 1243  Rate: 84  Rhythm: normal sinus rhythm  Axis: Normal  Intervals:none  ST&T Change: No ST segment elevation or depression. No abnormal T-wave inversion.  ____________________________________________  RADIOLOGY  DG Chest 2 View (Accession [id number]) (Order [id number])  Imaging  Date: 04/29/2016 Department: [facility] Released By/Authorizing: Norval Gable, MD (auto-released)  Exam Information   Status Exam Begun  Exam Ended   Final [99] 04/29/2016 10:15 AM 04/29/2016 10:41 AM  PACS Images   Show images for DG Chest 2 View  Study Result   CLINICAL DATA:  Cough and shortness of breath  EXAM: CHEST  2 VIEW  COMPARISON:  07/03/2015  FINDINGS: Small pleural effusions and streaky basilar  opacities. Cardiopericardial enlargement that is similar to prior. No edema or pneumothorax. Negative upper mediastinal contours. Mild leftward deviation of the trachea, also seen previously.  IMPRESSION: 1. Small pleural effusions and bibasilar atelectasis. Superimposed infection could be obscured. 2. Chronic cardiomegaly.  No pulmonary edema. 3. Mild leftward deviation of the trachea, also seen previously. Correlate with thyroid exam.   Electronically Signed   By: Monte Fantasia M.D.   On: 04/29/2016 10:48     ____________________________________________   PROCEDURES  Procedure(s) performed:   Procedures  Critical Care performed:   ____________________________________________   INITIAL IMPRESSION / ASSESSMENT AND PLAN / ED COURSE  Pertinent labs & imaging results that were available during my care of the patient were reviewed by me and considered in my medical decision making (see chart for details).    Clinical Course   Reviewed patient's x-ray without any pulmonary edema. She appears to have a mild renal insufficiency. We discussed trying to cut back on her Lasix as tolerated. I'll also be starting her on antibiotic as well as cough syrup for possible early pneumonia. Patient says that her blood pressure is about her baseline. It was at the 150s over 90s in the room. I will give her follow up with cardiology. She is understanding the plan and willing to comply and also understanding that if her symptoms worsen at any point to return to the hospital immediately. She does not have evidence of obvious heart failure at this time and has refused further blood work in the emergency department here.   ____________________________________________   FINAL CLINICAL IMPRESSION(S) / ED DIAGNOSES  Community acquired pneumonia.     NEW MEDICATIONS STARTED DURING THIS VISIT:  New Prescriptions   No medications on file     Note:  This document was prepared using  Dragon voice recognition software and may include unintentional dictation errors.    Orbie Pyo, MD 04/29/16 [phone]

## 2016-04-29 NOTE — ED Triage Notes (Signed)
Reports swelling and sob, seen at [facility] and sent for further f/u for CHF

## 2016-04-29 NOTE — Discharge Instructions (Signed)
Recommend patient go to ED for further evaluation and management

## 2016-04-29 NOTE — ED Notes (Signed)
Called lab about pt blood work, they stated she already had CBC with diff and BNP done earlier. This RN told lab just to do the add-ons/extras.

## 2016-04-29 NOTE — ED Provider Notes (Signed)
[facility]    CSN: [id number] Arrival date & time: 04/29/16  6948     History   Chief Complaint Chief Complaint  Patient presents with  . Cough  . Leg Swelling    HPI Theresa Rogers is a 65 y.o. female.   65 yo female with a h/o diabetes, CAD, CHF presents with a 1 week h/o cough, shortness of breath and intermittent leg swelling. Denies any chest pains,  fevers or chills.    The history is provided by the patient.  Cough  Cough characteristics:  Non-productive   Past Medical History:  Diagnosis Date  . Anemia   . CAD (coronary artery disease)    a. s/p MI/syncope in 2009 s/p remote stenting; b. NSTEMI 06/2015, cardiac cath 06/2015 pLAD 70% FFR 0.88 not clinically sig, dLAD 90%, mLCx 80% s/p PCI/DES 0%, pRCA 70%, mRCA 80%, dRCA 80%  . Chronic systolic CHF (congestive heart failure) ([place])   . CKD (chronic kidney disease), stage III   . Diabetes mellitus without complication ([place])   . HLD (hyperlipidemia)   . HTN (hypertension)   . Ischemic cardiomyopathy    a. echo 06/2015: EF 30-35%, diffuse HK, nable to exclude RWMA, LV diastolic function parameters were normal, mild MR, left atrium was mildly dilated, PASP 28 mm Hg, IVC was dilated c/w elevated CVP  . PAF (paroxysmal atrial fibrillation) ([place])    a. new onset 06/2015; b. CHADS2VASc = 5 (CHF, HTN, age x 1, vascular dz, female); c. not on long term full dose anticoagulation until 07/2015 in an effort to avoid triple therapy given her anemia     Patient Active Problem List   Diagnosis Date Noted  . Radial artery injury 08/03/2015  . Diabetes ([place]) 07/20/2015  . Chronic kidney disease, stage III (moderate)   . HLD (hyperlipidemia)   . HTN (hypertension)   . Anemia   . CKD (chronic kidney disease), stage III   . PAF (paroxysmal atrial fibrillation) ([place])   . Chronic systolic CHF (congestive heart failure) ([place])   . Ischemic cardiomyopathy   . CAD (coronary artery disease)   . Congestive dilated  cardiomyopathy ([place])   . Coronary artery disease involving native coronary artery of native heart without angina pectoris     Past Surgical History:  Procedure Laterality Date  . CARDIAC CATHETERIZATION N/A 06/22/2015   Procedure: Coronary Angiogram;  Surgeon: Minna Merritts, MD;  Location: [place] [facility];  Service: Cardiovascular;  Laterality: N/A;  . CARDIAC CATHETERIZATION N/A 06/26/2015   Procedure: Coronary Stent Intervention;  Surgeon: Wellington Hampshire, MD;  Location: [place] [facility];  Service: Cardiovascular;  Laterality: N/A;  . CARDIAC CATHETERIZATION N/A 08/03/2015   Procedure: Coronary Stent Intervention;  Surgeon: Wellington Hampshire, MD;  Location: [place] [facility];  Service: Cardiovascular;  Laterality: N/A;  . CORONARY ANGIOPLASTY WITH STENT PLACEMENT      OB History    No data available       Home Medications    Prior to Admission medications   Medication Sig Start Date End Date Taking? Authorizing Provider  aspirin EC 81 MG EC tablet Take 1 tablet (81 mg total) by mouth daily. 06/27/15  Yes Srikar Sudini, MD  atorvastatin (LIPITOR) 40 MG tablet Take 1 tablet (40 mg total) by mouth daily at 6 PM. 06/27/15  Yes Srikar Sudini, MD  clopidogrel (PLAVIX) 75 MG tablet Take 1 tablet (75 mg total) by mouth daily. 08/07/15  Yes Wellington Hampshire, MD  furosemide (LASIX) 40 MG tablet Take 1 tablet (40 mg total) by mouth daily. 08/04/15  Yes Rogelia Mire, NP  insulin glargine (LANTUS) 100 UNIT/ML injection Inject 20 Units into the skin at bedtime.   Yes Historical Provider, MD  insulin lispro (HUMALOG) 100 UNIT/ML injection Inject 8-12 Units into the skin 3 (three) times daily before meals.   Yes Historical Provider, MD  isosorbide mononitrate (IMDUR) 30 MG 24 hr tablet Take 1 tablet (30 mg total) by mouth 2 (two) times daily. 06/27/15  Yes Srikar Sudini, MD  losartan (COZAAR) 50 MG tablet Take 50 mg by mouth daily.   Yes Historical Provider, MD  Metoprolol Tartrate  75 MG TABS Take 75 mg by mouth 2 (two) times daily. 06/27/15  Yes Hillary Bow, MD    Family History Family History  Problem Relation Age of Onset  . CAD Mother   . Alzheimer's disease Mother   . Prostate cancer Father   . Diabetes Brother   . Kidney failure Brother     Social History Social History  Substance Use Topics  . Smoking status: Never Smoker  . Smokeless tobacco: Never Used  . Alcohol use No     Allergies   Hydralazine hcl   Review of Systems Review of Systems  Respiratory: Positive for cough.      Physical Exam Triage Vital Signs ED Triage Vitals  Enc Vitals Group     BP 04/29/16 0936 (!) 165/91     Pulse Rate 04/29/16 0936 82     Resp 04/29/16 0936 18     Temp 04/29/16 0936 98.5 F (36.9 C)     Temp Source 04/29/16 0936 Oral     SpO2 04/29/16 0936 96 %     Weight 04/29/16 0936 140 lb (63.5 kg)     Height 04/29/16 0936 5\' 4"  (1.626 m)     Head Circumference --      Peak Flow --      Pain Score 04/29/16 0937 0     Pain Loc --      Pain Edu? --      Excl. in [place]? --    No data found.   Updated Vital Signs BP (!) 165/91 (BP Location: Left Arm)   Pulse 77   Temp 98.5 F (36.9 C) (Oral)   Resp 18   Ht 5\' 4"  (1.626 m)   Wt 140 lb (63.5 kg)   SpO2 97%   BMI 24.03 kg/m   Visual Acuity Right Eye Distance:   Left Eye Distance:   Bilateral Distance:    Right Eye Near:   Left Eye Near:    Bilateral Near:     Physical Exam  Constitutional: She appears well-developed and well-nourished. No distress.  HENT:  Head: Normocephalic and atraumatic.  Right Ear: Tympanic membrane, external ear and ear canal normal.  Left Ear: Tympanic membrane, external ear and ear canal normal.  Nose: No mucosal edema, rhinorrhea, nose lacerations, sinus tenderness, nasal deformity, septal deviation or nasal septal hematoma. No epistaxis.  No foreign bodies. Right sinus exhibits no maxillary sinus tenderness and no frontal sinus tenderness. Left sinus exhibits  no maxillary sinus tenderness and no frontal sinus tenderness.  Mouth/Throat: Uvula is midline, oropharynx is clear and moist and mucous membranes are normal. No oropharyngeal exudate.  Eyes: Conjunctivae and EOM are normal. Pupils are equal, round, and reactive to light. Right eye exhibits no discharge. Left eye exhibits no discharge. No scleral icterus.  Neck: Normal range  of motion. Neck supple. No thyromegaly present.  Cardiovascular: Normal rate, regular rhythm and normal heart sounds.   Pulmonary/Chest: Effort normal. No respiratory distress. She has no wheezes. She has rales (bilaterally).  Musculoskeletal: She exhibits no edema.  Lymphadenopathy:    She has no cervical adenopathy.  Skin: She is not diaphoretic.  Nursing note and vitals reviewed.    UC Treatments / Results  Labs (all labs ordered are listed, but only abnormal results are displayed) Labs Reviewed  CBC WITH DIFFERENTIAL/PLATELET - Abnormal; Notable for the following:       Result Value   RDW 15.9 (*)    Lymphs Abs 0.6 (*)    All other components within normal limits  BASIC METABOLIC PANEL - Abnormal; Notable for the following:    Sodium 134 (*)    Chloride 98 (*)    Glucose, Bld 252 (*)    BUN 45 (*)    Creatinine, Ser 1.69 (*)    Calcium 8.6 (*)    GFR calc non Af Amer 31 (*)    GFR calc Af Amer 36 (*)    All other components within normal limits    EKG  EKG Interpretation None       Radiology Dg Chest 2 View  Result Date: 04/29/2016 CLINICAL DATA:  Cough and shortness of breath EXAM: CHEST  2 VIEW COMPARISON:  07/03/2015 FINDINGS: Small pleural effusions and streaky basilar opacities. Cardiopericardial enlargement that is similar to prior. No edema or pneumothorax. Negative upper mediastinal contours. Mild leftward deviation of the trachea, also seen previously. IMPRESSION: 1. Small pleural effusions and bibasilar atelectasis. Superimposed infection could be obscured. 2. Chronic cardiomegaly.  No  pulmonary edema. 3. Mild leftward deviation of the trachea, also seen previously. Correlate with thyroid exam. Electronically Signed   By: Monte Fantasia M.D.   On: 04/29/2016 10:48    Procedures Procedures (including critical care time)  Medications Ordered in UC Medications - No data to display   Initial Impression / Assessment and Plan / UC Course  I have reviewed the triage vital signs and the nursing notes.  Pertinent labs & imaging results that were available during my care of the patient were reviewed by me and considered in my medical decision making (see chart for details).  Clinical Course       Final Clinical Impressions(s) / UC Diagnoses   Final diagnoses:  Acute on chronic systolic congestive heart failure [facility])    New Prescriptions Discharge Medication List as of 04/29/2016 11:12 AM     1. diagnosis reviewed with patient; recommend patient go to ED for further evaluation and management. Patient refuses transport and states will drive herself. Explained risks and patient verbalizes understanding. Patient in stable condition. Report called to charge RN at [facility] ED.    Norval Gable, MD 04/29/16 1134

## 2016-04-30 ENCOUNTER — Telehealth: Payer: Self-pay | Admitting: Cardiovascular Disease

## 2016-04-30 NOTE — Telephone Encounter (Signed)
Lmov for patient to call back and schedule ED seen for SOB

## 2016-05-08 NOTE — Telephone Encounter (Signed)
Spoke to patient and she is coming to see Dr Fletcher Anon on 05/17/16

## 2016-05-13 ENCOUNTER — Encounter: Payer: Self-pay | Admitting: Emergency Medicine

## 2016-05-13 ENCOUNTER — Inpatient Hospital Stay
Admission: EM | Admit: 2016-05-13 | Discharge: 2016-05-20 | DRG: 286 | Payer: Medicaid Other | Attending: Internal Medicine | Admitting: Internal Medicine

## 2016-05-13 ENCOUNTER — Emergency Department: Payer: Medicaid Other

## 2016-05-13 DIAGNOSIS — I5082 Biventricular heart failure: Secondary | ICD-10-CM | POA: Diagnosis present

## 2016-05-13 DIAGNOSIS — I480 Paroxysmal atrial fibrillation: Secondary | ICD-10-CM | POA: Diagnosis present

## 2016-05-13 DIAGNOSIS — I25118 Atherosclerotic heart disease of native coronary artery with other forms of angina pectoris: Secondary | ICD-10-CM | POA: Diagnosis present

## 2016-05-13 DIAGNOSIS — N183 Chronic kidney disease, stage 3 unspecified: Secondary | ICD-10-CM

## 2016-05-13 DIAGNOSIS — Z7902 Long term (current) use of antithrombotics/antiplatelets: Secondary | ICD-10-CM

## 2016-05-13 DIAGNOSIS — I5023 Acute on chronic systolic (congestive) heart failure: Secondary | ICD-10-CM

## 2016-05-13 DIAGNOSIS — Z955 Presence of coronary angioplasty implant and graft: Secondary | ICD-10-CM

## 2016-05-13 DIAGNOSIS — Z7982 Long term (current) use of aspirin: Secondary | ICD-10-CM

## 2016-05-13 DIAGNOSIS — Z888 Allergy status to other drugs, medicaments and biological substances status: Secondary | ICD-10-CM

## 2016-05-13 DIAGNOSIS — E785 Hyperlipidemia, unspecified: Secondary | ICD-10-CM | POA: Diagnosis present

## 2016-05-13 DIAGNOSIS — I509 Heart failure, unspecified: Secondary | ICD-10-CM

## 2016-05-13 DIAGNOSIS — I25708 Atherosclerosis of coronary artery bypass graft(s), unspecified, with other forms of angina pectoris: Secondary | ICD-10-CM

## 2016-05-13 DIAGNOSIS — R748 Abnormal levels of other serum enzymes: Secondary | ICD-10-CM | POA: Diagnosis not present

## 2016-05-13 DIAGNOSIS — Z841 Family history of disorders of kidney and ureter: Secondary | ICD-10-CM

## 2016-05-13 DIAGNOSIS — E118 Type 2 diabetes mellitus with unspecified complications: Secondary | ICD-10-CM

## 2016-05-13 DIAGNOSIS — R0600 Dyspnea, unspecified: Secondary | ICD-10-CM

## 2016-05-13 DIAGNOSIS — E1122 Type 2 diabetes mellitus with diabetic chronic kidney disease: Secondary | ICD-10-CM | POA: Diagnosis present

## 2016-05-13 DIAGNOSIS — I519 Heart disease, unspecified: Secondary | ICD-10-CM

## 2016-05-13 DIAGNOSIS — I272 Pulmonary hypertension, unspecified: Secondary | ICD-10-CM | POA: Diagnosis present

## 2016-05-13 DIAGNOSIS — R609 Edema, unspecified: Secondary | ICD-10-CM

## 2016-05-13 DIAGNOSIS — I5043 Acute on chronic combined systolic (congestive) and diastolic (congestive) heart failure: Secondary | ICD-10-CM | POA: Diagnosis present

## 2016-05-13 DIAGNOSIS — Z833 Family history of diabetes mellitus: Secondary | ICD-10-CM

## 2016-05-13 DIAGNOSIS — I255 Ischemic cardiomyopathy: Secondary | ICD-10-CM

## 2016-05-13 DIAGNOSIS — R600 Localized edema: Secondary | ICD-10-CM

## 2016-05-13 DIAGNOSIS — N179 Acute kidney failure, unspecified: Secondary | ICD-10-CM | POA: Diagnosis present

## 2016-05-13 DIAGNOSIS — Z8249 Family history of ischemic heart disease and other diseases of the circulatory system: Secondary | ICD-10-CM

## 2016-05-13 DIAGNOSIS — Z79899 Other long term (current) drug therapy: Secondary | ICD-10-CM

## 2016-05-13 DIAGNOSIS — E1165 Type 2 diabetes mellitus with hyperglycemia: Secondary | ICD-10-CM

## 2016-05-13 DIAGNOSIS — Z794 Long term (current) use of insulin: Secondary | ICD-10-CM

## 2016-05-13 DIAGNOSIS — I130 Hypertensive heart and chronic kidney disease with heart failure and stage 1 through stage 4 chronic kidney disease, or unspecified chronic kidney disease: Principal | ICD-10-CM | POA: Diagnosis present

## 2016-05-13 DIAGNOSIS — I252 Old myocardial infarction: Secondary | ICD-10-CM

## 2016-05-13 DIAGNOSIS — I1310 Hypertensive heart and chronic kidney disease without heart failure, with stage 1 through stage 4 chronic kidney disease, or unspecified chronic kidney disease: Secondary | ICD-10-CM

## 2016-05-13 LAB — GLUCOSE, CAPILLARY
GLUCOSE-CAPILLARY: 260 mg/dL — AB (ref 65–99)
GLUCOSE-CAPILLARY: 228 mg/dL — AB (ref 65–99)
Glucose-Capillary: 309 mg/dL — ABNORMAL HIGH (ref 65–99)

## 2016-05-13 LAB — CBC
HCT: 38 % (ref 35.0–47.0)
Hemoglobin: 12.2 g/dL (ref 12.0–16.0)
MCH: 26.5 pg (ref 26.0–34.0)
MCHC: 32.2 g/dL (ref 32.0–36.0)
MCV: 82.3 fL (ref 80.0–100.0)
PLATELETS: 316 10*3/uL (ref 150–440)
RBC: 4.62 MIL/uL (ref 3.80–5.20)
RDW: 15.3 % — ABNORMAL HIGH (ref 11.5–14.5)
WBC: 7 10*3/uL (ref 3.6–11.0)

## 2016-05-13 LAB — URINALYSIS COMPLETE WITH MICROSCOPIC (ARMC ONLY)
BILIRUBIN URINE: NEGATIVE
Bacteria, UA: NONE SEEN
Glucose, UA: >500 mg/dL — AB
LEUKOCYTES UA: NEGATIVE
NITRITE: NEGATIVE
PH: 5 (ref 5.0–8.0)
Protein, ur: >500 mg/dL — AB
Specific Gravity, Urine: 1.012 (ref 1.005–1.030)

## 2016-05-13 LAB — TROPONIN I
TROPONIN I: 0.46 ng/mL — AB (ref <0.03)
TROPONIN I: 0.56 ng/mL — AB (ref <0.03)
TROPONIN I: 0.68 ng/mL — AB (ref <0.03)
Troponin I: 0.51 ng/mL (ref <0.03)

## 2016-05-13 LAB — BASIC METABOLIC PANEL
Anion gap: 10 (ref 5–15)
BUN: 51 mg/dL — ABNORMAL HIGH (ref 6–20)
CALCIUM: 8.6 mg/dL — AB (ref 8.9–10.3)
CHLORIDE: 96 mmol/L — AB (ref 101–111)
CO2: 28 mmol/L (ref 22–32)
CREATININE: 1.81 mg/dL — AB (ref 0.44–1.00)
GFR, EST AFRICAN AMERICAN: 33 mL/min — AB (ref >60)
GFR, EST NON AFRICAN AMERICAN: 28 mL/min — AB (ref >60)
Glucose, Bld: 340 mg/dL — ABNORMAL HIGH (ref 65–99)
Potassium: 4.7 mmol/L (ref 3.5–5.1)
SODIUM: 134 mmol/L — AB (ref 135–145)

## 2016-05-13 LAB — BRAIN NATRIURETIC PEPTIDE: B Natriuretic Peptide: 2790 pg/mL — ABNORMAL HIGH (ref 0.0–100.0)

## 2016-05-13 MED ORDER — FUROSEMIDE 10 MG/ML IJ SOLN
20.0000 mg | Freq: Three times a day (TID) | INTRAMUSCULAR | Status: DC
  Administered 2016-05-13: 20 mg via INTRAVENOUS
  Filled 2016-05-13: qty 2

## 2016-05-13 MED ORDER — INSULIN GLARGINE 100 UNIT/ML ~~LOC~~ SOLN
20.0000 [IU] | Freq: Every day | SUBCUTANEOUS | Status: DC
  Administered 2016-05-13: 20 [IU] via SUBCUTANEOUS
  Filled 2016-05-13 (×2): qty 0.2

## 2016-05-13 MED ORDER — ATORVASTATIN CALCIUM 20 MG PO TABS
40.0000 mg | ORAL_TABLET | Freq: Every day | ORAL | Status: DC
  Administered 2016-05-16 – 2016-05-20 (×5): 40 mg via ORAL
  Filled 2016-05-13 (×8): qty 2

## 2016-05-13 MED ORDER — ASPIRIN 81 MG PO CHEW
81.0000 mg | CHEWABLE_TABLET | Freq: Every day | ORAL | Status: DC
  Administered 2016-05-13 – 2016-05-20 (×8): 81 mg via ORAL
  Filled 2016-05-13 (×9): qty 1

## 2016-05-13 MED ORDER — HEPARIN SODIUM (PORCINE) 5000 UNIT/ML IJ SOLN
5000.0000 [IU] | Freq: Three times a day (TID) | INTRAMUSCULAR | Status: DC
  Administered 2016-05-13 – 2016-05-19 (×20): 5000 [IU] via SUBCUTANEOUS
  Filled 2016-05-13 (×20): qty 1

## 2016-05-13 MED ORDER — METOPROLOL TARTRATE 25 MG PO TABS
75.0000 mg | ORAL_TABLET | Freq: Two times a day (BID) | ORAL | Status: DC
  Administered 2016-05-13 – 2016-05-15 (×6): 75 mg via ORAL
  Filled 2016-05-13 (×6): qty 3

## 2016-05-13 MED ORDER — FUROSEMIDE 10 MG/ML IJ SOLN
80.0000 mg | Freq: Once | INTRAMUSCULAR | Status: AC
  Administered 2016-05-13: 80 mg via INTRAVENOUS
  Filled 2016-05-13: qty 8

## 2016-05-13 MED ORDER — INSULIN ASPART 100 UNIT/ML ~~LOC~~ SOLN
0.0000 [IU] | Freq: Three times a day (TID) | SUBCUTANEOUS | Status: DC
  Administered 2016-05-13: 7 [IU] via SUBCUTANEOUS
  Administered 2016-05-13: 5 [IU] via SUBCUTANEOUS
  Administered 2016-05-14: 3 [IU] via SUBCUTANEOUS
  Administered 2016-05-14: 1 [IU] via SUBCUTANEOUS
  Administered 2016-05-15: 7 [IU] via SUBCUTANEOUS
  Administered 2016-05-15: 3 [IU] via SUBCUTANEOUS
  Administered 2016-05-16: 7 [IU] via SUBCUTANEOUS
  Administered 2016-05-16: 3 [IU] via SUBCUTANEOUS
  Administered 2016-05-16: 7 [IU] via SUBCUTANEOUS
  Administered 2016-05-17: 5 [IU] via SUBCUTANEOUS
  Administered 2016-05-18: 3 [IU] via SUBCUTANEOUS
  Administered 2016-05-18: 2 [IU] via SUBCUTANEOUS
  Administered 2016-05-19: 5 [IU] via SUBCUTANEOUS
  Administered 2016-05-19: 3 [IU] via SUBCUTANEOUS
  Administered 2016-05-19: 2 [IU] via SUBCUTANEOUS
  Administered 2016-05-20: 5 [IU] via SUBCUTANEOUS
  Filled 2016-05-13: qty 1
  Filled 2016-05-13: qty 5
  Filled 2016-05-13: qty 3
  Filled 2016-05-13: qty 5
  Filled 2016-05-13: qty 7
  Filled 2016-05-13 (×2): qty 3
  Filled 2016-05-13: qty 5
  Filled 2016-05-13: qty 3
  Filled 2016-05-13 (×2): qty 7
  Filled 2016-05-13: qty 2
  Filled 2016-05-13: qty 7
  Filled 2016-05-13: qty 5
  Filled 2016-05-13: qty 3

## 2016-05-13 MED ORDER — LOSARTAN POTASSIUM 50 MG PO TABS
50.0000 mg | ORAL_TABLET | Freq: Every day | ORAL | Status: DC
  Administered 2016-05-13 – 2016-05-18 (×6): 50 mg via ORAL
  Filled 2016-05-13: qty 1
  Filled 2016-05-13: qty 2
  Filled 2016-05-13: qty 1
  Filled 2016-05-13 (×2): qty 2
  Filled 2016-05-13: qty 1

## 2016-05-13 MED ORDER — CLOPIDOGREL BISULFATE 75 MG PO TABS
75.0000 mg | ORAL_TABLET | Freq: Every day | ORAL | Status: DC
  Administered 2016-05-13 – 2016-05-20 (×8): 75 mg via ORAL
  Filled 2016-05-13 (×8): qty 1

## 2016-05-13 MED ORDER — FUROSEMIDE 10 MG/ML IJ SOLN
40.0000 mg | Freq: Two times a day (BID) | INTRAMUSCULAR | Status: DC
  Administered 2016-05-13 – 2016-05-14 (×2): 40 mg via INTRAVENOUS
  Filled 2016-05-13 (×2): qty 4

## 2016-05-13 MED ORDER — SODIUM CHLORIDE 0.9% FLUSH
3.0000 mL | Freq: Two times a day (BID) | INTRAVENOUS | Status: DC
  Administered 2016-05-13 – 2016-05-20 (×11): 3 mL via INTRAVENOUS

## 2016-05-13 MED ORDER — ISOSORBIDE MONONITRATE ER 30 MG PO TB24
30.0000 mg | ORAL_TABLET | Freq: Two times a day (BID) | ORAL | Status: DC
  Administered 2016-05-13 – 2016-05-20 (×15): 30 mg via ORAL
  Filled 2016-05-13 (×15): qty 1

## 2016-05-13 NOTE — Consult Note (Signed)
Cardiology Consultation Note  Patient ID: Theresa Rogers, MRN: [id number], DOB/AGE: 64-29-1952 64 y.o. Admit date: 05/13/2016   Date of Consult: 05/13/2016 Primary Physician: Pcp Not In System Primary Cardiologist: Dr. Fletcher Anon, MD Requesting Physician: Dr. Anselm Jungling, MD  Chief Complaint: SOB/LE swelling Reason for Consult: Acute on chronic systolic CHF  HPI: 65 y.o. female with h/o CAD s/p remote PCI in 2009 s/p NSTEMI with PCI/DES to mid LCx in 06/2015, ICM/chronic systolic CHF, PAF (diagnosed 06/2015), DM2, CKD stage III, HTN, HLD, and anemia who presented to [facility] on 12/4 with increase LE swelling.   Previously hospitalzied in 06/7791 for acute systolic CHF and NSTEMI. Echo showed an EF of 30-35%. She was noted to have an elevated SCr on presentation. She ultimately underwent LHC that showed 70% stenosis in the mid LAD, 80% stenosis in the mid LCx and multiple 80% lesions in the RCA. She underwent staged revascularization after a few days with FFR guidance. LAD FFR was not significant and thus PCI was deferred. LCx PCI and DES was performed without complications. She was not started on anticoagulation given her CKD and anemia in the setting of her maintaining sinus rhythm, especially in the setting of DAPT. She did attempt to have staged PCI of the RCA in late February 2017, though this was complicated by porforation of the radial artery after attempting to advance the guiding catheter and this was able to be managed conservatively with external pressure and did not require any surgery or other procedures. At her last follow up in 08/2015 she was doing well without SOB or chest discomfort. She was seen in the ED on 11/20 with increased SOB x 1 week. She had been doubling her Lasix at home that week. She was felt to have PNA and started on doxycycline without improvement in her cough. However, shortly after improvement in her cough she began to note LE swelling.    She presented to [facility] on 12/4 with  bilateral LE swelling with increased SOB for the past week. Dry weight of 135, over the past 2 weeks her weight has increased to 150 pounds. She has been conmpliant with her Lasix, though does not feel like this has helped her fluid status. Over the past coupled of days she had been doubling her home dose of Lasix from 40 mg daily to 40 mg bid. She less than 2 L of fluids during the day and watches her salt intake. Upon her arrival to the ED she was found to have SCr 1.81 (approximate baseline), K+ 4.7, WBC 7.0, HGB 12.2, PLT 316, BNP 2790, troponin pending. CXR showed bilateral small pleural effusion with bilateral basilar atelectasis or infiltrate. EKG showed NSR, 91 bpm, prior infarct along inferoanterior regions. She was ordered to receive IV Lasix 80 mg in the ED and placed on IV Lasix 20 mg q 8 hours upon   Past Medical History:  Diagnosis Date  . Anemia   . CAD (coronary artery disease)    a. s/p MI/syncope in 2009 s/p remote stenting; b. NSTEMI 06/2015, cardiac cath 06/2015 pLAD 70% FFR 0.88 not clinically sig, dLAD 90%, mLCx 80% s/p PCI/DES 0%, pRCA 70%, mRCA 80%, dRCA 80%  . Chronic systolic CHF (congestive heart failure) ([place])   . CKD (chronic kidney disease), stage III   . Diabetes mellitus without complication ([place])   . HLD (hyperlipidemia)   . HTN (hypertension)   . Ischemic cardiomyopathy    a. echo 06/2015: EF 30-35%, diffuse HK, nable to exclude RWMA,  LV diastolic function parameters were normal, mild MR, left atrium was mildly dilated, PASP 28 mm Hg, IVC was dilated c/w elevated CVP  . PAF (paroxysmal atrial fibrillation) ([place])    a. new onset 06/2015; b. CHADS2VASc = 5 (CHF, HTN, age x 1, vascular dz, female); c. not on long term full dose anticoagulation until 07/2015 in an effort to avoid triple therapy given her anemia       Most Recent Cardiac Studies: As above   Surgical History:  Past Surgical History:  Procedure Laterality Date  . CARDIAC CATHETERIZATION N/A 06/22/2015    Procedure: Coronary Angiogram;  Surgeon: Minna Merritts, MD;  Location: [facility] [facility];  Service: Cardiovascular;  Laterality: N/A;  . CARDIAC CATHETERIZATION N/A 06/26/2015   Procedure: Coronary Stent Intervention;  Surgeon: Wellington Hampshire, MD;  Location: [facility];  Service: Cardiovascular;  Laterality: N/A;  . CARDIAC CATHETERIZATION N/A 08/03/2015   Procedure: Coronary Stent Intervention;  Surgeon: Wellington Hampshire, MD;  Location: [place] [facility];  Service: Cardiovascular;  Laterality: N/A;  . CORONARY ANGIOPLASTY WITH STENT PLACEMENT       Home Meds: Prior to Admission medications   Medication Sig Start Date End Date Taking? Authorizing Provider  aspirin EC 81 MG EC tablet Take 1 tablet (81 mg total) by mouth daily. 06/27/15  Yes Srikar Sudini, MD  atorvastatin (LIPITOR) 40 MG tablet Take 1 tablet (40 mg total) by mouth daily at 6 PM. 06/27/15  Yes Srikar Sudini, MD  clopidogrel (PLAVIX) 75 MG tablet Take 1 tablet (75 mg total) by mouth daily. 08/07/15  Yes Wellington Hampshire, MD  furosemide (LASIX) 40 MG tablet Take 1 tablet (40 mg total) by mouth daily. 08/04/15  Yes Rogelia Mire, NP  insulin glargine (LANTUS) 100 UNIT/ML injection Inject 20 Units into the skin at bedtime.   Yes Historical Provider, MD  insulin lispro (HUMALOG) 100 UNIT/ML injection Inject 5-12 Units into the skin 3 (three) times daily before meals.    Yes Historical Provider, MD  losartan (COZAAR) 50 MG tablet Take 50 mg by mouth daily.   Yes Historical Provider, MD  Metoprolol Tartrate 75 MG TABS Take 75 mg by mouth 2 (two) times daily. 06/27/15  Yes Srikar Sudini, MD  doxycycline (VIBRAMYCIN) 100 MG capsule Take 1 capsule (100 mg total) by mouth 2 (two) times daily. Patient not taking: Reported on 05/13/2016 04/29/16   Orbie Pyo, MD  guaiFENesin-codeine 100-10 MG/5ML syrup Take 5 mLs by mouth 3 (three) times daily as needed for cough. 04/29/16   Orbie Pyo, MD    isosorbide mononitrate (IMDUR) 30 MG 24 hr tablet Take 1 tablet (30 mg total) by mouth 2 (two) times daily. Patient not taking: Reported on 05/13/2016 06/27/15   Hillary Bow, MD    Inpatient Medications:  . furosemide  20 mg Intravenous Q8H  . insulin aspart  0-9 Units Subcutaneous TID WC  . Metoprolol Tartrate  75 mg Oral BID     Allergies:  Allergies  Allergen Reactions  . Hydralazine Hcl Swelling    Social History   Social History  . Marital status: Divorced    Spouse name: N/A  . Number of children: N/A  . Years of education: N/A   Occupational History  . Not on file.   Social History Main Topics  . Smoking status: Never Smoker  . Smokeless tobacco: Never Used  . Alcohol use No  . Drug use: No  . Sexual activity:  Not on file   Other Topics Concern  . Not on file   Social History Narrative  . No narrative on file     Family History  Problem Relation Age of Onset  . CAD Mother   . Alzheimer's disease Mother   . Prostate cancer Father   . Diabetes Brother   . Kidney failure Brother      Review of Systems: Review of Systems  Constitutional: Positive for malaise/fatigue. Negative for chills, diaphoresis, fever and weight loss.  HENT: Negative for congestion.   Eyes: Negative for discharge and redness.  Respiratory: Positive for shortness of breath. Negative for cough, hemoptysis, sputum production and wheezing.   Cardiovascular: Positive for leg swelling. Negative for chest pain, palpitations, orthopnea, claudication and PND.  Gastrointestinal: Negative for abdominal pain, blood in stool, heartburn, melena, nausea and vomiting.  Genitourinary: Negative for hematuria.  Musculoskeletal: Negative for falls and myalgias.  Skin: Negative for rash.  Neurological: Positive for weakness. Negative for dizziness, tingling, tremors, sensory change, speech change, focal weakness and loss of consciousness.  Endo/Heme/Allergies: Does not bruise/bleed easily.   Psychiatric/Behavioral: Negative for substance abuse. The patient is not nervous/anxious.   All other systems reviewed and are negative.   Labs:  Recent Labs  05/13/16 0905  TROPONINI 0.51*   Lab Results  Component Value Date   WBC 7.0 05/13/2016   HGB 12.2 05/13/2016   HCT 38.0 05/13/2016   MCV 82.3 05/13/2016   PLT 316 05/13/2016    Recent Labs Lab 05/13/16 0855  NA 134*  K 4.7  CL 96*  CO2 28  BUN 51*  CREATININE 1.81*  CALCIUM 8.6*  GLUCOSE 340*   Lab Results  Component Value Date   CHOL 222 (H) 06/21/2015   HDL 41 06/21/2015   LDLCALC 149 (H) 06/21/2015   TRIG 162 (H) 06/21/2015   No results found for: DDIMER  Radiology/Studies:  Dg Chest 2 View  Result Date: 05/13/2016 CLINICAL DATA:  Swelling bilateral lower extremities EXAM: CHEST  2 VIEW COMPARISON:  04/29/2016 FINDINGS: Borderline cardiomegaly. Bilateral small pleural effusion with bilateral basilar atelectasis or infiltrate. Again noted linear atelectasis, scarring or infiltrate in lingula. No convincing pulmonary edema. IMPRESSION: Bilateral small pleural effusion with bilateral basilar atelectasis or infiltrate. Again noted linear atelectasis, scarring or infiltrate in lingula. No convincing pulmonary edema. Electronically Signed   By: Lahoma Crocker M.D.   On: 05/13/2016 10:20   Dg Chest 2 View  Result Date: 04/29/2016 CLINICAL DATA:  Cough and shortness of breath EXAM: CHEST  2 VIEW COMPARISON:  07/03/2015 FINDINGS: Small pleural effusions and streaky basilar opacities. Cardiopericardial enlargement that is similar to prior. No edema or pneumothorax. Negative upper mediastinal contours. Mild leftward deviation of the trachea, also seen previously. IMPRESSION: 1. Small pleural effusions and bibasilar atelectasis. Superimposed infection could be obscured. 2. Chronic cardiomegaly.  No pulmonary edema. 3. Mild leftward deviation of the trachea, also seen previously. Correlate with thyroid exam. Electronically  Signed   By: Monte Fantasia M.D.   On: 04/29/2016 10:48    EKG: Interpreted by me showed: NSR, 91 bpm, prior infarct along inferoanterior regions Telemetry: Interpreted by me showed: NSR, 80's bpm  Weights: Filed Weights   05/13/16 0851 05/13/16 0854  Weight: 150 lb (68 kg) 150 lb (68 kg)     Physical Exam: Blood pressure (!) 171/97, pulse 87, temperature 97.7 F (36.5 C), temperature source Oral, resp. rate 16, height 5\' 4"  (1.626 m), weight 150 lb (68 kg), SpO2 97 %.  Body mass index is 25.75 kg/m. General: Well developed, well nourished, in no acute distress. Head: Normocephalic, atraumatic, sclera non-icteric, no xanthomas, nares are without discharge.  Neck: Negative for carotid bruits. JVD not elevated. Lungs: Crackles along bilateral bases. Heart: RRR with S1 S2. No murmurs, rubs, or gallops appreciated. Abdomen: Soft, non-tender, non-distended with normoactive bowel sounds. No hepatomegaly. No rebound/guarding. No obvious abdominal masses. Msk:  Strength and tone appear normal for age. Extremities: No clubbing or cyanosis. 2+ pitting edema to the bilateral knees. Distal pedal pulses are 2+ and equal bilaterally. Neuro: Alert and oriented X 3. No facial asymmetry. No focal deficit. Moves all extremities spontaneously. Psych:  Responds to questions appropriately with a normal affect.    Assessment and Plan:  Principal Problem:   Acute on chronic systolic CHF (congestive heart failure) ([place])    1. Acute on chronic systolic CHF/ICM: -Likely exacerbated by recent pulmonary infection given her cardiomyopathy she is likely to have decreased reserve  -Currently on room air with oxygen saturations of 97% -Agree with trial of IV Lasix for today -May need change to torsemide at time of discharge -Check echo to evaluate for possible further reduction in her EF -Unable to feel palpitations (even with 06/2015 episode of Afib). Cannot rule out tachy-arrhythmia playing a role in her  volume overload as well -Monitor on tele, if it is felt arrhythmia could be playing a role in the above consider outpatient cardiac monitoring -Unlikely ACS given lack of chest pain -CHF education -Continue metoprolol -Dry weight of 135 pounds, currently 150 pounds  2. CAD/elevated troponin: -No symptoms of angina -Minimal troponin elevation likely secondary to #1 -Continue DAPT with ASA and Plavix -Continue Imdur and metoprolol -No plans for heparin gtt at this time unless troponin trends upwards, continue to cycle  3. CKD stage III: -Appears to be at baseline -Monitor with diuresis  4. PAF: -Has never felt palpitations -Not on full-dose anticoagulation given CKD, anemia, need for DAPT, and has been maintaining sinus rhythm at follow ups -Consider cardiac monitoring as above -Currently in sinus rhythm  5. HLD: -Lipitor  6. HTN: -Lasix as above -Titrate antihypertensives as needed   Signed, Marcille Blanco [place] Pager: [phone] 05/13/2016, 12:20 PM

## 2016-05-13 NOTE — Progress Notes (Signed)
Inpatient Diabetes Program Recommendations  AACE/ADA: New Consensus Statement on Inpatient Glycemic Control (2015)  Target Ranges:  Prepandial:   less than 140 mg/dL      Peak postprandial:   less than 180 mg/dL (1-2 hours)      Critically ill patients:  140 - 180 mg/dL  Results for EBONEE, STOBER (MRN [id number]) as of 05/13/2016 14:23  Ref. Range 05/13/2016 12:05  Glucose-Capillary Latest Ref Range: 65 - 99 mg/dL 309 (H)   Results for JOANANN, MIES (MRN [id number]) as of 05/13/2016 14:23  Ref. Range 06/21/2015 13:29  Hemoglobin A1C Latest Ref Range: 4.0 - 6.0 % 11.6 (H)   Review of Glycemic Control  Diabetes history: DM2 Outpatient Diabetes medications: Lantus 20 units QHS, Humalog 5-12 units TID with meals Current orders for Inpatient glycemic control: Lantus 20 units QHS, Novolog 0-9 units TID with meals  Inpatient Diabetes Program Recommendations: HgbA1C: Please add on an A1C to blood in lab to evaluate glycemic control over the past 2-3 months. Insulin-Correction: Please consider ordering Novolog bedtime correction scale.  Thanks, Barnie Alderman, RN, MSN, CDE Diabetes Coordinator Inpatient Diabetes Program [phone] (Team Pager from 8am to 5pm)

## 2016-05-13 NOTE — H&P (Signed)
[facility] NAME: Theresa Rogers    MR#:  [id number]  DATE OF BIRTH:  08/09/1950  DATE OF ADMISSION:  05/13/2016  PRIMARY CARE PHYSICIAN: Pcp Not In System   REQUESTING/REFERRING PHYSICIAN: Gwyndolyn Rogers  CHIEF COMPLAINT:   Chief Complaint  Patient presents with  . Leg Swelling    HISTORY OF PRESENT ILLNESS: Theresa Rogers  is a 65 y.o. female with a known history of Coronary artery disease, chronic systolic CHF, chronic kidney disease, diabetes, hyperlipidemia, hypertension, paroxysmal atrial fibrillation- is very active at baseline and compliant with dietary restrictions and medication. 2 weeks ago she was in emergency room with complaint of cough and found to have pneumonia and was given doxycycline by ER physician. Her cough started getting better with the treatment but she noted gradually her swelling on the legs was getting worse. Concerned with that she called her cardiologist office and she got an appointment to come see him on eighth of December. Her last swelling on the leg started getting worse up to the abdomen and it was making her feel short of breath while doing her activities and also making it difficult to bend her legs while getting in and out of the car. She did well to her daily dose of Lasix from 40 mg daily to 40 mg 2 times a day for last 4-5 days. Also started strict fluid restriction at home but it did not seem helping and so finally came to emergency room today.  PAST MEDICAL HISTORY:   Past Medical History:  Diagnosis Date  . Anemia   . CAD (coronary artery disease)    a. s/p MI/syncope in 2009 s/p remote stenting; b. NSTEMI 06/2015, cardiac cath 06/2015 pLAD 70% FFR 0.88 not clinically sig, dLAD 90%, mLCx 80% s/p PCI/DES 0%, pRCA 70%, mRCA 80%, dRCA 80%  . Chronic systolic CHF (congestive heart failure) ([place])   . CKD (chronic kidney disease), stage III   . Diabetes mellitus without complication ([place])   . HLD  (hyperlipidemia)   . HTN (hypertension)   . Ischemic cardiomyopathy    a. echo 06/2015: EF 30-35%, diffuse HK, nable to exclude RWMA, LV diastolic function parameters were normal, mild MR, left atrium was mildly dilated, PASP 28 mm Hg, IVC was dilated c/w elevated CVP  . PAF (paroxysmal atrial fibrillation) ([place])    a. new onset 06/2015; b. CHADS2VASc = 5 (CHF, HTN, age x 1, vascular dz, female); c. not on long term full dose anticoagulation until 07/2015 in an effort to avoid triple therapy given her anemia     PAST SURGICAL HISTORY: Past Surgical History:  Procedure Laterality Date  . CARDIAC CATHETERIZATION N/A 06/22/2015   Procedure: Coronary Angiogram;  Surgeon: Minna Merritts, MD;  Location: [facility];  Service: Cardiovascular;  Laterality: N/A;  . CARDIAC CATHETERIZATION N/A 06/26/2015   Procedure: Coronary Stent Intervention;  Surgeon: Wellington Hampshire, MD;  Location: [facility];  Service: Cardiovascular;  Laterality: N/A;  . CARDIAC CATHETERIZATION N/A 08/03/2015   Procedure: Coronary Stent Intervention;  Surgeon: Wellington Hampshire, MD;  Location: [facility];  Service: Cardiovascular;  Laterality: N/A;  . CORONARY ANGIOPLASTY WITH STENT PLACEMENT      SOCIAL HISTORY:  Social History  Substance Use Topics  . Smoking status: Never Smoker  . Smokeless tobacco: Never Used  . Alcohol use No    FAMILY HISTORY:  Family History  Problem Relation Age of Onset  .  CAD Mother   . Alzheimer's disease Mother   . Prostate cancer Father   . Diabetes Brother   . Kidney failure Brother     DRUG ALLERGIES:  Allergies  Allergen Reactions  . Hydralazine Hcl Swelling    REVIEW OF SYSTEMS:   CONSTITUTIONAL: No fever, fatigue or weakness.  EYES: No blurred or double vision.  EARS, NOSE, AND THROAT: No tinnitus or ear pain.  RESPIRATORY: No cough,Positive shortness of breath, no wheezing or hemoptysis.  CARDIOVASCULAR: No chest pain, orthopnea, positive  bilateral edema.  GASTROINTESTINAL: No nausea, vomiting, diarrhea or abdominal pain.  GENITOURINARY: No dysuria, hematuria.  ENDOCRINE: No polyuria, nocturia,  HEMATOLOGY: No anemia, easy bruising or bleeding SKIN: No rash or lesion. MUSCULOSKELETAL: No joint pain or arthritis.   NEUROLOGIC: No tingling, numbness, weakness.  PSYCHIATRY: No anxiety or depression.   MEDICATIONS AT HOME:  Prior to Admission medications   Medication Sig Start Date End Date Taking? Authorizing Provider  aspirin EC 81 MG EC tablet Take 1 tablet (81 mg total) by mouth daily. 06/27/15  Yes Srikar Sudini, MD  atorvastatin (LIPITOR) 40 MG tablet Take 1 tablet (40 mg total) by mouth daily at 6 PM. 06/27/15  Yes Srikar Sudini, MD  clopidogrel (PLAVIX) 75 MG tablet Take 1 tablet (75 mg total) by mouth daily. 08/07/15  Yes Wellington Hampshire, MD  furosemide (LASIX) 40 MG tablet Take 1 tablet (40 mg total) by mouth daily. 08/04/15  Yes Rogelia Mire, NP  insulin glargine (LANTUS) 100 UNIT/ML injection Inject 20 Units into the skin at bedtime.   Yes Historical Provider, MD  insulin lispro (HUMALOG) 100 UNIT/ML injection Inject 5-12 Units into the skin 3 (three) times daily before meals.    Yes Historical Provider, MD  losartan (COZAAR) 50 MG tablet Take 50 mg by mouth daily.   Yes Historical Provider, MD  Metoprolol Tartrate 75 MG TABS Take 75 mg by mouth 2 (two) times daily. 06/27/15  Yes Srikar Sudini, MD  doxycycline (VIBRAMYCIN) 100 MG capsule Take 1 capsule (100 mg total) by mouth 2 (two) times daily. Patient not taking: Reported on 05/13/2016 04/29/16   Orbie Pyo, MD  guaiFENesin-codeine 100-10 MG/5ML syrup Take 5 mLs by mouth 3 (three) times daily as needed for cough. 04/29/16   Orbie Pyo, MD  isosorbide mononitrate (IMDUR) 30 MG 24 hr tablet Take 1 tablet (30 mg total) by mouth 2 (two) times daily. Patient not taking: Reported on 05/13/2016 06/27/15   Hillary Bow, MD      PHYSICAL  EXAMINATION:   VITAL SIGNS: Blood pressure (!) 156/88, pulse 94, temperature 97.7 F (36.5 C), temperature source Oral, resp. rate 18, height 5\' 4"  (1.626 m), weight 68 kg (150 lb), SpO2 97 %.  GENERAL:  65 y.o.-year-old patient lying in the bed with no acute distress.  EYES: Pupils equal, round, reactive to light and accommodation. No scleral icterus. Extraocular muscles intact.  HEENT: Head atraumatic, normocephalic. Oropharynx and nasopharynx clear.  NECK:  Supple, no jugular venous distention. No thyroid enlargement, no tenderness.  LUNGS: Normal breath sounds bilaterally, no wheezing, Bilateral crepitation. No use of accessory muscles of respiration.  CARDIOVASCULAR: S1, S2 normal. No murmurs, rubs, or gallops.  ABDOMEN: Soft, nontender, nondistended. Bowel sounds present. No organomegaly or mass.  EXTREMITIES: Bilateral pedal edema, some cyanosis on both feet and toes, no clubbing.  NEUROLOGIC: Cranial nerves II through XII are intact. Muscle strength 5/5 in all extremities. Sensation intact. Gait not checked.  PSYCHIATRIC: The  patient is alert and oriented x 3.  SKIN: No obvious rash, lesion, or ulcer.   LABORATORY PANEL:   CBC  Recent Labs Lab 05/13/16 0855  WBC 7.0  HGB 12.2  HCT 38.0  PLT 316  MCV 82.3  MCH 26.5  MCHC 32.2  RDW 15.3*   ------------------------------------------------------------------------------------------------------------------  Chemistries   Recent Labs Lab 05/13/16 0855  NA 134*  K 4.7  CL 96*  CO2 28  GLUCOSE 340*  BUN 51*  CREATININE 1.81*  CALCIUM 8.6*   ------------------------------------------------------------------------------------------------------------------ estimated creatinine clearance is 29.7 mL/min (by C-G formula based on SCr of 1.81 mg/dL (H)). ------------------------------------------------------------------------------------------------------------------ No results for input(s): TSH, T4TOTAL, T3FREE, THYROIDAB  in the last 72 hours.  Invalid input(s): FREET3   Coagulation profile No results for input(s): INR, PROTIME in the last 168 hours. ------------------------------------------------------------------------------------------------------------------- No results for input(s): DDIMER in the last 72 hours. -------------------------------------------------------------------------------------------------------------------  Cardiac Enzymes No results for input(s): CKMB, TROPONINI, MYOGLOBIN in the last 168 hours.  Invalid input(s): CK ------------------------------------------------------------------------------------------------------------------ Invalid input(s): POCBNP  ---------------------------------------------------------------------------------------------------------------  Urinalysis    Component Value Date/Time   COLORURINE YELLOW (A) 07/04/2015 1039   APPEARANCEUR HAZY (A) 07/04/2015 1039   LABSPEC 1.014 07/04/2015 1039   PHURINE 5.0 07/04/2015 1039   GLUCOSEU 50 (A) 07/04/2015 1039   HGBUR NEGATIVE 07/04/2015 1039   BILIRUBINUR NEGATIVE 07/04/2015 1039   KETONESUR NEGATIVE 07/04/2015 1039   PROTEINUR 100 (A) 07/04/2015 1039   NITRITE NEGATIVE 07/04/2015 1039   LEUKOCYTESUR TRACE (A) 07/04/2015 1039     RADIOLOGY: Dg Chest 2 View  Result Date: 05/13/2016 CLINICAL DATA:  Swelling bilateral lower extremities EXAM: CHEST  2 VIEW COMPARISON:  04/29/2016 FINDINGS: Borderline cardiomegaly. Bilateral small pleural effusion with bilateral basilar atelectasis or infiltrate. Again noted linear atelectasis, scarring or infiltrate in lingula. No convincing pulmonary edema. IMPRESSION: Bilateral small pleural effusion with bilateral basilar atelectasis or infiltrate. Again noted linear atelectasis, scarring or infiltrate in lingula. No convincing pulmonary edema. Electronically Signed   By: Lahoma Crocker M.D.   On: 05/13/2016 10:20    EKG: Orders placed or performed during the hospital  encounter of 05/13/16  . ED EKG  . ED EKG  . EKG 12-Lead  . EKG 12-Lead    IMPRESSION AND PLAN:  * Acute on chronic systolic congestive heart failure   We'll give her IV Lasix and monitor her intake and output and daily weight measurement.   Fluid restriction and monitor on telemetry, check troponins.   She had an echocardiogram done earlier this year so I would leave it up to cardiologist if they want to repeat it.   As she has other cardiac issues including A. fib and coronary artery disease I would like to get a cardiology consult on case.  * Chronic kidney disease stage III   Appears at baseline, monitor with IV diuretics use.   If IV Lasix does not help much we may have to use tolvaptan, but it depends on cardiologist.  * Hypertension   Continue home medications.  * Diabetes   Continue basal insulin dose and keep on sliding scale coverage.  * Coronary artery disease   She is on medical management, continue home regimen and get cardiology consult.  * Paroxysmal atrial fibrillation   Rate is controlled by medications, currently in sinus rhythm.   Motor on anticoagulation as she need antiplatelet therapy for her CAD. As instructed by cardiologist in the past.   All the records are reviewed and case discussed with ED provider. Management  plans discussed with the patient, family and they are in agreement.  CODE STATUS: full. Code Status History    Date Active Date Inactive Code Status Order ID Comments User Context   07/03/2015  5:14 PM 07/05/2015  3:43 PM Full Code [id number]  Fritzi Mandes, MD Inpatient   06/26/2015 12:16 PM 06/27/2015  7:14 PM Full Code [id number]  Wellington Hampshire, MD Inpatient   06/22/2015  9:26 AM 06/26/2015 12:16 PM Full Code [id number]  Minna Merritts, MD Inpatient   06/19/2015 11:35 AM 06/22/2015  9:26 AM Full Code [id number]  Hillary Bow, MD ED       TOTAL TIME TAKING CARE OF THIS PATIENT: 50 minutes.    Vaughan Basta M.D on 05/13/2016    Between 7am to 6pm - Pager - [phone]  After 6pm go to [email or web address] - password [email or web address] [facility]  Office  [phone]  CC: Primary care physician; Pcp Not In System   Note: This dictation was prepared with Dragon dictation along with smaller phrase technology. Any transcriptional errors that result from this process are unintentional.

## 2016-05-13 NOTE — ED Notes (Signed)
Denies chest pain or SOB

## 2016-05-13 NOTE — ED Provider Notes (Signed)
[facility] Emergency Department Provider Note        Time seen: ----------------------------------------- 10:10 AM on 05/13/2016 -----------------------------------------    I have reviewed the triage vital signs and the nursing notes.   HISTORY  Chief Complaint Leg Swelling    HPI Theresa Rogers is a 65 y.o. female presents to the ER for bilateral swelling in her feet and ankles. Patient is also complaining of shortness of breath. Patient states symptoms started about a week ago but has been slowly increasing. She was seen in the ER and told to be consistent with her Lasix. She states Lasix hasn't seemed to have affected her swelling very much. She was doubling her Lasix over the past several days without any improvement. She denies fevers, chills or other complaints.   Past Medical History:  Diagnosis Date  . Anemia   . CAD (coronary artery disease)    a. s/p MI/syncope in 2009 s/p remote stenting; b. NSTEMI 06/2015, cardiac cath 06/2015 pLAD 70% FFR 0.88 not clinically sig, dLAD 90%, mLCx 80% s/p PCI/DES 0%, pRCA 70%, mRCA 80%, dRCA 80%  . Chronic systolic CHF (congestive heart failure) ([place])   . CKD (chronic kidney disease), stage III   . Diabetes mellitus without complication ([place])   . HLD (hyperlipidemia)   . HTN (hypertension)   . Ischemic cardiomyopathy    a. echo 06/2015: EF 30-35%, diffuse HK, nable to exclude RWMA, LV diastolic function parameters were normal, mild MR, left atrium was mildly dilated, PASP 28 mm Hg, IVC was dilated c/w elevated CVP  . PAF (paroxysmal atrial fibrillation) ([place])    a. new onset 06/2015; b. CHADS2VASc = 5 (CHF, HTN, age x 1, vascular dz, female); c. not on long term full dose anticoagulation until 07/2015 in an effort to avoid triple therapy given her anemia     Patient Active Problem List   Diagnosis Date Noted  . Radial artery injury 08/03/2015  . Diabetes ([place]) 07/20/2015  . Chronic kidney disease, stage III  (moderate)   . HLD (hyperlipidemia)   . HTN (hypertension)   . Anemia   . CKD (chronic kidney disease), stage III   . PAF (paroxysmal atrial fibrillation) ([place])   . Chronic systolic CHF (congestive heart failure) ([place])   . Ischemic cardiomyopathy   . CAD (coronary artery disease)   . Congestive dilated cardiomyopathy ([place])   . Coronary artery disease involving native coronary artery of native heart without angina pectoris     Past Surgical History:  Procedure Laterality Date  . CARDIAC CATHETERIZATION N/A 06/22/2015   Procedure: Coronary Angiogram;  Surgeon: Minna Merritts, MD;  Location: [facility];  Service: Cardiovascular;  Laterality: N/A;  . CARDIAC CATHETERIZATION N/A 06/26/2015   Procedure: Coronary Stent Intervention;  Surgeon: Wellington Hampshire, MD;  Location: [facility];  Service: Cardiovascular;  Laterality: N/A;  . CARDIAC CATHETERIZATION N/A 08/03/2015   Procedure: Coronary Stent Intervention;  Surgeon: Wellington Hampshire, MD;  Location: [facility];  Service: Cardiovascular;  Laterality: N/A;  . CORONARY ANGIOPLASTY WITH STENT PLACEMENT      Allergies Hydralazine hcl  Social History Social History  Substance Use Topics  . Smoking status: Never Smoker  . Smokeless tobacco: Never Used  . Alcohol use No    Review of Systems Constitutional: Negative for fever. Cardiovascular: Negative for chest pain. Respiratory: Positive for shortness of breath Gastrointestinal: Negative for abdominal pain, vomiting and diarrhea. Musculoskeletal: Negative for back pain. Positive for  edema Skin: Negative for rash. Neurological: Negative for headaches, focal weakness or numbness.  10-point ROS otherwise negative.  ____________________________________________   PHYSICAL EXAM:  VITAL SIGNS: ED Triage Vitals  Enc Vitals Group     BP 05/13/16 0850 (!) 156/88     Pulse Rate 05/13/16 0850 94     Resp 05/13/16 0850 18     Temp 05/13/16 0850 97.7 F  (36.5 C)     Temp Source 05/13/16 0850 Oral     SpO2 05/13/16 0850 97 %     Weight 05/13/16 0851 150 lb (68 kg)     Height 05/13/16 0851 5\' 4"  (1.626 m)     Head Circumference --      Peak Flow --      Pain Score 05/13/16 0854 3     Pain Loc --      Pain Edu? --      Excl. in [place]? --    Constitutional: Alert and oriented. Well appearing and in no distress. Eyes: Conjunctivae are normal. PERRL. Normal extraocular movements. ENT   Head: Normocephalic and atraumatic.   Nose: No congestion/rhinnorhea.   Mouth/Throat: Mucous membranes are moist.   Neck: No stridor. Cardiovascular: Normal rate, regular rhythm. No murmurs, rubs, or gallops. Respiratory: Normal respiratory effort without tachypnea nor retractions. Breath sounds are clear and equal bilaterally. No wheezes/rales/rhonchi. Gastrointestinal: Soft and nontender. Normal bowel sounds Musculoskeletal: Nontender with normal range of motion in all extremities. Bilateral lower extremity pitting edema Neurologic:  Normal speech and language. No gross focal neurologic deficits are appreciated.  Skin:  Skin is warm, dry and intact. No rash noted. Psychiatric: Mood and affect are normal. Speech and behavior are normal.  ____________________________________________  EKG: Interpreted by me. Sinus rhythm with a rate of 91 bpm, normal PR interval, normal QRS size, normal QT, normal axis.  ____________________________________________  ED COURSE:  Pertinent labs & imaging results that were available during my care of the patient were reviewed by me and considered in my medical decision making (see chart for details). Clinical Course   Patient presents to the ER in no distress but with worsening edema and shortness of breath. We will assess with labs and imaging.  Procedures ____________________________________________   LABS (pertinent positives/negatives)  Labs Reviewed  BASIC METABOLIC PANEL - Abnormal; Notable for the  following:       Result Value   Sodium 134 (*)    Chloride 96 (*)    Glucose, Bld 340 (*)    BUN 51 (*)    Creatinine, Ser 1.81 (*)    Calcium 8.6 (*)    GFR calc non Af Amer 28 (*)    GFR calc Af Amer 33 (*)    All other components within normal limits  CBC - Abnormal; Notable for the following:    RDW 15.3 (*)    All other components within normal limits  BRAIN NATRIURETIC PEPTIDE - Abnormal; Notable for the following:    B Natriuretic Peptide 2,790.0 (*)    All other components within normal limits  URINALYSIS COMPLETEWITH MICROSCOPIC ([facility] ONLY)  TROPONIN I    RADIOLOGY Images were viewed by me  Chest x-ray reveals basilar effusions   ____________________________________________  FINAL ASSESSMENT AND PLAN  Congestive heart failure, edema  Plan: Patient with labs and imaging as dictated above. Patient had presented to the ER for swelling and shortness of breath by increasing her Lasix. I have discussed with cardiology who recommends admission and diuresis. I will discuss with  the hospitalist for admission.   Earleen Newport, MD   Note: This dictation was prepared with Dragon dictation. Any transcriptional errors that result from this process are unintentional    Earleen Newport, MD 05/13/16 1056

## 2016-05-13 NOTE — ED Triage Notes (Signed)
Pt to ed with c/o swelling in feet and ankles.  Pt states started about a week ago and slowly increasing.

## 2016-05-13 NOTE — ED Notes (Signed)
Report attempted. Secretary Sonia Baller stated RN went downstairs to meet someone and will be right back up to take report

## 2016-05-14 ENCOUNTER — Observation Stay (HOSPITAL_BASED_OUTPATIENT_CLINIC_OR_DEPARTMENT_OTHER)
Admit: 2016-05-14 | Discharge: 2016-05-14 | Disposition: A | Discharge status: Home / Self Care | Payer: Medicaid Other | Attending: Physician Assistant | Admitting: Physician Assistant

## 2016-05-14 ENCOUNTER — Observation Stay: Payer: Medicaid Other

## 2016-05-14 DIAGNOSIS — I25708 Atherosclerosis of coronary artery bypass graft(s), unspecified, with other forms of angina pectoris: Secondary | ICD-10-CM

## 2016-05-14 DIAGNOSIS — I5023 Acute on chronic systolic (congestive) heart failure: Secondary | ICD-10-CM | POA: Diagnosis not present

## 2016-05-14 DIAGNOSIS — E1165 Type 2 diabetes mellitus with hyperglycemia: Secondary | ICD-10-CM

## 2016-05-14 DIAGNOSIS — N183 Chronic kidney disease, stage 3 unspecified: Secondary | ICD-10-CM

## 2016-05-14 DIAGNOSIS — R0600 Dyspnea, unspecified: Secondary | ICD-10-CM | POA: Diagnosis not present

## 2016-05-14 DIAGNOSIS — R600 Localized edema: Secondary | ICD-10-CM

## 2016-05-14 DIAGNOSIS — N179 Acute kidney failure, unspecified: Secondary | ICD-10-CM | POA: Diagnosis not present

## 2016-05-14 DIAGNOSIS — I255 Ischemic cardiomyopathy: Secondary | ICD-10-CM | POA: Diagnosis not present

## 2016-05-14 DIAGNOSIS — I519 Heart disease, unspecified: Secondary | ICD-10-CM

## 2016-05-14 DIAGNOSIS — I25810 Atherosclerosis of coronary artery bypass graft(s) without angina pectoris: Secondary | ICD-10-CM

## 2016-05-14 DIAGNOSIS — E118 Type 2 diabetes mellitus with unspecified complications: Secondary | ICD-10-CM

## 2016-05-14 LAB — BASIC METABOLIC PANEL
ANION GAP: 6 (ref 5–15)
BUN: 60 mg/dL — ABNORMAL HIGH (ref 6–20)
CHLORIDE: 99 mmol/L — AB (ref 101–111)
CO2: 31 mmol/L (ref 22–32)
Calcium: 8.4 mg/dL — ABNORMAL LOW (ref 8.9–10.3)
Creatinine, Ser: 2.05 mg/dL — ABNORMAL HIGH (ref 0.44–1.00)
GFR calc non Af Amer: 24 mL/min — ABNORMAL LOW (ref >60)
GFR, EST AFRICAN AMERICAN: 28 mL/min — AB (ref >60)
Glucose, Bld: 81 mg/dL (ref 65–99)
POTASSIUM: 3.9 mmol/L (ref 3.5–5.1)
Sodium: 136 mmol/L (ref 135–145)

## 2016-05-14 LAB — GLUCOSE, CAPILLARY
GLUCOSE-CAPILLARY: 141 mg/dL — AB (ref 65–99)
GLUCOSE-CAPILLARY: 210 mg/dL — AB (ref 65–99)
Glucose-Capillary: 44 mg/dL — CL (ref 65–99)
Glucose-Capillary: 86 mg/dL (ref 65–99)
Glucose-Capillary: 304 mg/dL — ABNORMAL HIGH (ref 65–99)

## 2016-05-14 LAB — CBC
HEMATOCRIT: 31.2 % — AB (ref 35.0–47.0)
HEMOGLOBIN: 10.3 g/dL — AB (ref 12.0–16.0)
MCH: 27.1 pg (ref 26.0–34.0)
MCHC: 33.1 g/dL (ref 32.0–36.0)
MCV: 81.7 fL (ref 80.0–100.0)
Platelets: 286 10*3/uL (ref 150–440)
RBC: 3.82 MIL/uL (ref 3.80–5.20)
RDW: 15.2 % — ABNORMAL HIGH (ref 11.5–14.5)
WBC: 5.4 10*3/uL (ref 3.6–11.0)

## 2016-05-14 MED ORDER — FUROSEMIDE 10 MG/ML IJ SOLN
20.0000 mg | Freq: Two times a day (BID) | INTRAMUSCULAR | Status: DC
  Administered 2016-05-14 – 2016-05-16 (×4): 20 mg via INTRAVENOUS
  Filled 2016-05-14 (×4): qty 2

## 2016-05-14 MED ORDER — INSULIN GLARGINE 100 UNIT/ML ~~LOC~~ SOLN
17.0000 [IU] | Freq: Every day | SUBCUTANEOUS | Status: DC
  Administered 2016-05-14: 17 [IU] via SUBCUTANEOUS
  Filled 2016-05-14 (×2): qty 0.17

## 2016-05-14 MED ORDER — INSULIN ASPART 100 UNIT/ML ~~LOC~~ SOLN
0.0000 [IU] | Freq: Every day | SUBCUTANEOUS | Status: DC
  Administered 2016-05-18: 4 [IU] via SUBCUTANEOUS
  Filled 2016-05-14 (×2): qty 4
  Filled 2016-05-14: qty 3
  Filled 2016-05-14: qty 2
  Filled 2016-05-14: qty 5

## 2016-05-14 NOTE — Progress Notes (Signed)
Follow-up appointment at the [facility] is made for May 24, 2016 at 9:00am. Of note, she cancelled her last appointment back in July. Thank you.

## 2016-05-14 NOTE — Progress Notes (Signed)
[facility]  ROUNDING NOTE   Subjective:   Theresa Rogers admitted on 05/13/2016 for CHF (congestive heart failure) (HCC) [I50.9] Bilateral lower extremity edema [R60.0] Acute on chronic congestive heart failure, unspecified congestive heart failure type ([place]) [I50.9] Dyspnea, unspecified type [R06.00]   She states she had a upper respiratory infection and gotten off her meds and diet.   Objective:  Vital signs in last 24 hours:  Temp:  [97.8 F (36.6 C)-98.3 F (36.8 C)] 98.2 F (36.8 C) (12/05 1144) Pulse Rate:  [59-72] 72 (12/05 1144) Resp:  [16-20] 16 (12/05 1144) BP: (124-136)/(72-77) 126/72 (12/05 1144) SpO2:  [92 %-95 %] 95 % (12/05 1144) Weight:  [74.4 kg (164 lb)] 74.4 kg (164 lb) (12/05 0405)  Weight change:  Filed Weights   05/13/16 0854 05/13/16 1331 05/14/16 0405  Weight: 68 kg (150 lb) 74.6 kg (164 lb 8 oz) 74.4 kg (164 lb)    Intake/Output: I/O last 3 completed shifts: In: 240 [P.O.:240] Out: 770 [Urine:770]   Intake/Output this shift:  Total I/O In: 240 [P.O.:240] Out: 600 [Urine:600]  Physical Exam: General: NAD, laying in bed  Head: Normocephalic, atraumatic. Moist oral mucosal membranes  Eyes: Anicteric, PERRL  Neck: Supple, trachea midline  Lungs:  Clear to auscultation  Heart: Regular rate and rhythm  Abdomen:  Soft, nontender,   Extremities:  1+ peripheral edema.  Neurologic: Nonfocal, moving all four extremities  Skin: No lesions       Basic Metabolic Panel:  Recent Labs Lab 05/13/16 0855 05/14/16 0357  NA 134* 136  K 4.7 3.9  CL 96* 99*  CO2 28 31  GLUCOSE 340* 81  BUN 51* 60*  CREATININE 1.81* 2.05*  CALCIUM 8.6* 8.4*    Liver Function Tests: No results for input(s): AST, ALT, ALKPHOS, BILITOT, PROT, ALBUMIN in the last 168 hours. No results for input(s): LIPASE, AMYLASE in the last 168 hours. No results for input(s): AMMONIA in the last 168 hours.  CBC:  Recent Labs Lab 05/13/16 0855 05/14/16 0357   WBC 7.0 5.4  HGB 12.2 10.3*  HCT 38.0 31.2*  MCV 82.3 81.7  PLT 316 286    Cardiac Enzymes:  Recent Labs Lab 05/13/16 0905 05/13/16 1137 05/13/16 1538 05/13/16 1948  TROPONINI 0.51* 0.46* 0.56* 0.68*    BNP: Invalid input(s): POCBNP  CBG:  Recent Labs Lab 05/13/16 2140 05/14/16 0550 05/14/16 0617 05/14/16 1128 05/14/16 1641  GLUCAP 228* 44* 86 141* 210*    Microbiology: No results found for this or any previous visit.  Coagulation Studies: No results for input(s): LABPROT, INR in the last 72 hours.  Urinalysis:  Recent Labs  05/13/16 1136  COLORURINE YELLOW*  LABSPEC 1.012  PHURINE 5.0  GLUCOSEU >500*  HGBUR 1+*  BILIRUBINUR NEGATIVE  KETONESUR TRACE*  PROTEINUR >500*  NITRITE NEGATIVE  LEUKOCYTESUR NEGATIVE      Imaging: Dg Chest 2 View  Result Date: 05/14/2016 CLINICAL DATA:  Congestive heart failure. Dilated cardiomyopathy. Productive cough. EXAM: CHEST  2 VIEW COMPARISON:  05/13/2016 FINDINGS: Improved aeration of both lungs is seen with decreased bibasilar atelectasis. Cardiomegaly and pulmonary vascular congestion are stable. Small bilateral pleural effusions show no significant change. No pneumothorax visualized. IMPRESSION: Improved aeration of both lungs with decreased bibasilar atelectasis. No significant change in small bilateral pleural effusions. Stable cardiomegaly and pulmonary vascular congestion. Electronically Signed   By: Earle Gell M.D.   On: 05/14/2016 07:12   Dg Chest 2 View  Result Date: 05/13/2016 CLINICAL DATA:  Swelling  bilateral lower extremities EXAM: CHEST  2 VIEW COMPARISON:  04/29/2016 FINDINGS: Borderline cardiomegaly. Bilateral small pleural effusion with bilateral basilar atelectasis or infiltrate. Again noted linear atelectasis, scarring or infiltrate in lingula. No convincing pulmonary edema. IMPRESSION: Bilateral small pleural effusion with bilateral basilar atelectasis or infiltrate. Again noted linear  atelectasis, scarring or infiltrate in lingula. No convincing pulmonary edema. Electronically Signed   By: Lahoma Crocker M.D.   On: 05/13/2016 10:20     Medications:    . aspirin  81 mg Oral Daily  . atorvastatin  40 mg Oral q1800  . clopidogrel  75 mg Oral Daily  . furosemide  20 mg Intravenous Q12H  . heparin  5,000 Units Subcutaneous Q8H  . insulin aspart  0-5 Units Subcutaneous QHS  . insulin aspart  0-9 Units Subcutaneous TID WC  . insulin glargine  17 Units Subcutaneous QHS  . isosorbide mononitrate  30 mg Oral BID  . losartan  50 mg Oral Daily  . metoprolol tartrate  75 mg Oral BID  . sodium chloride flush  3 mL Intravenous Q12H     Assessment/ Plan:  Theresa Rogers is a 65 y.o. white female with coronary artery disease, diabetes mellitus type II, congestive heart failure, atrial fibrillation, anemia, hypertension, hyperlipidemia  1. Acute renal failure on Chronic kidney disease stage III with proteinuria: chronic kidney disease secondary to hypertension and diabetes. Baseline creatinine of 1.6 GFR of 34 on 3/17 Acute renal failure from acute cardio-renal syndrome.  - Continue IV furosemide.  - continue losartan  2. Hypertension and acute on chronic systolic congestive heart failure with EF of 30-35% on echocardiogram from 06/19/2015.  New echocardiogram pending Blood pressure at goal. - metoprolol, losartan and furosemide.   3. Diabetes mellitus type II with chronic kidney disease: insulin dependent. History of not well controlled.  - Check hemoglobin A1c   LOS: 0 Theresa Rogers, [place] 12/5/20175:21 PM

## 2016-05-14 NOTE — Progress Notes (Signed)
Patient Name: Alfredo Collymore Date of Encounter: 05/14/2016  Primary Cardiologist: [facility] Problem List     Principal Problem:   Acute on chronic systolic CHF (congestive heart failure) (HCC)     Subjective   Less SOB this morning, though still with significant volume overload and LE swelling. Renal function worsened from 1.8 to 2 this morning with diuresis. Minus 530 mL for the admission.   Inpatient Medications    Scheduled Meds: . aspirin  81 mg Oral Daily  . atorvastatin  40 mg Oral q1800  . clopidogrel  75 mg Oral Daily  . furosemide  40 mg Intravenous Q12H  . heparin  5,000 Units Subcutaneous Q8H  . insulin aspart  0-5 Units Subcutaneous QHS  . insulin aspart  0-9 Units Subcutaneous TID WC  . insulin glargine  20 Units Subcutaneous QHS  . isosorbide mononitrate  30 mg Oral BID  . losartan  50 mg Oral Daily  . metoprolol tartrate  75 mg Oral BID  . sodium chloride flush  3 mL Intravenous Q12H   Continuous Infusions:  PRN Meds:    Vital Signs    Vitals:   05/13/16 1331 05/13/16 1948 05/14/16 0405 05/14/16 0912  BP: 140/77 124/74 125/77 136/76  Pulse: 64 64 (!) 59 63  Resp: 17 16 20 16   Temp: 98.3 F (36.8 C) 97.8 F (36.6 C) 98.3 F (36.8 C) 97.9 F (36.6 C)  TempSrc: Oral Oral Oral Oral  SpO2: 98% 93% 92% 94%  Weight: 164 lb 8 oz (74.6 kg)  164 lb (74.4 kg)   Height: 5\' 4"  (1.626 m)       Intake/Output Summary (Last 24 hours) at 05/14/16 0931 Last data filed at 05/14/16 0233  Gross per 24 hour  Intake              240 ml  Output              770 ml  Net             -530 ml   Filed Weights   05/13/16 0854 05/13/16 1331 05/14/16 0405  Weight: 150 lb (68 kg) 164 lb 8 oz (74.6 kg) 164 lb (74.4 kg)    Physical Exam    GEN: Well nourished, well developed, in no acute distress.  HEENT: Grossly normal.  Neck: Supple, no JVD, carotid bruits, or masses. Cardiac: RRR, no murmurs, rubs, or gallops. No clubbing, cyanosis. 1+ pitting edema  to the bilateral shins.  Radials/DP/PT 2+ and equal bilaterally.  Respiratory:  Decreased with bilateral crackles. GI: Soft, nontender, nondistended, BS + x 4. MS: no deformity or atrophy. Skin: warm and dry, no rash. Neuro:  Strength and sensation are intact. Psych: AAOx3.  Normal affect.  Labs    CBC  Recent Labs  05/13/16 0855 05/14/16 0357  WBC 7.0 5.4  HGB 12.2 10.3*  HCT 38.0 31.2*  MCV 82.3 81.7  PLT 316 440   Basic Metabolic Panel  Recent Labs  05/13/16 0855 05/14/16 0357  NA 134* 136  K 4.7 3.9  CL 96* 99*  CO2 28 31  GLUCOSE 340* 81  BUN 51* 60*  CREATININE 1.81* 2.05*  CALCIUM 8.6* 8.4*   Liver Function Tests No results for input(s): AST, ALT, ALKPHOS, BILITOT, PROT, ALBUMIN in the last 72 hours. No results for input(s): LIPASE, AMYLASE in the last 72 hours. Cardiac Enzymes  Recent Labs  05/13/16 1137 05/13/16 1538 05/13/16 1948  TROPONINI 0.46* 0.56*  0.68*   BNP Invalid input(s): POCBNP D-Dimer No results for input(s): DDIMER in the last 72 hours. Hemoglobin A1C No results for input(s): HGBA1C in the last 72 hours. Fasting Lipid Panel No results for input(s): CHOL, HDL, LDLCALC, TRIG, CHOLHDL, LDLDIRECT in the last 72 hours. Thyroid Function Tests No results for input(s): TSH, T4TOTAL, T3FREE, THYROIDAB in the last 72 hours.  Invalid input(s): FREET3  Telemetry    NSR, 70's bpm - Personally Reviewed  ECG    n/a - Personally Reviewed  Radiology    Dg Chest 2 View  Result Date: 05/14/2016 CLINICAL DATA:  Congestive heart failure. Dilated cardiomyopathy. Productive cough. EXAM: CHEST  2 VIEW COMPARISON:  05/13/2016 FINDINGS: Improved aeration of both lungs is seen with decreased bibasilar atelectasis. Cardiomegaly and pulmonary vascular congestion are stable. Small bilateral pleural effusions show no significant change. No pneumothorax visualized. IMPRESSION: Improved aeration of both lungs with decreased bibasilar atelectasis. No  significant change in small bilateral pleural effusions. Stable cardiomegaly and pulmonary vascular congestion. Electronically Signed   By: Earle Gell M.D.   On: 05/14/2016 07:12   Dg Chest 2 View  Result Date: 05/13/2016 CLINICAL DATA:  Swelling bilateral lower extremities EXAM: CHEST  2 VIEW COMPARISON:  04/29/2016 FINDINGS: Borderline cardiomegaly. Bilateral small pleural effusion with bilateral basilar atelectasis or infiltrate. Again noted linear atelectasis, scarring or infiltrate in lingula. No convincing pulmonary edema. IMPRESSION: Bilateral small pleural effusion with bilateral basilar atelectasis or infiltrate. Again noted linear atelectasis, scarring or infiltrate in lingula. No convincing pulmonary edema. Electronically Signed   By: Lahoma Crocker M.D.   On: 05/13/2016 10:20    Cardiac Studies   Echo pending  Patient Profile     65 y.o. female with history of CAD s/p remote PCI in 2009 s/p NSTEMI with PCI/DES to mid LCx in 06/2015, ICM/chronic systolic CHF, PAF (diagnosed 06/2015), DM2, CKD stage III, HTN, HLD, and anemia who presented to [facility] on 12/4 with increase LE swelling and SOB found to have acute on chronic systolic CHF and worsening renal function with diuresis.   Assessment & Plan    1. Acute on chronic systolic CHF/ICM: -Likely exacerbated by recent pulmonary infection given her cardiomyopathy she is likely to have decreased reserve  -Currently on room air  -Renal function worsened with IV Lasix with minimal UOP -Will ask that renal evaluate patient for possible etiologies of her renal function that has been declining over the past 12 months -Hold Lasix at this time given worsening renal function -Check echo to evaluate for possible further reduction in her EF -Unable to feel palpitations (even with 06/2015 episode of Afib). Cannot rule out tachy-arrhythmia playing a role in her volume overload as well -Monitor on tele, if it is felt arrhythmia could be playing a role in  the above consider outpatient cardiac monitoring -Unlikely ACS given lack of chest pain -CHF education -Continue metoprolol -Dry weight of 135 pounds, currently 150 pounds  2. CAD/elevated troponin: -No symptoms of angina -Minimal troponin elevation likely secondary to #1 -Continue DAPT with ASA and Plavix -Continue Imdur and metoprolol -No plans for heparin gtt at this time unless troponin trends upwards or if there is a new reduction in EF on echo  3. Acute on CKD stage III: -Recommend renal consult as above -Diuresis on hold  4. PAF: -Has never felt palpitations -Not on full-dose anticoagulation given CKD, anemia, need for DAPT, and has been maintaining sinus rhythm at follow ups -Consider cardiac monitoring as above -Currently in  sinus rhythm  5. HLD: -Lipitor  6. HTN: -Stable -Titrate antihypertensives as needed   Signed, Marcille Blanco [facility] Pager: [phone] 05/14/2016, 9:31 AM

## 2016-05-14 NOTE — Progress Notes (Signed)
*  PRELIMINARY RESULTS* Echocardiogram 2D Echocardiogram has been performed.  Theresa Rogers 05/14/2016, 7:54 AM

## 2016-05-14 NOTE — Progress Notes (Signed)
Hypoglycemic Event  CBG: 44  Treatment: 15 GM carbohydrate snackorange juice, peanut butter and crackers  Symptoms: Sweaty diaphoretic  Follow-up CBG: [id number] CBG Result:88  Possible Reasons for Event: Inadequate meal intake No snack after dinner, patient typically eats breakfast at 0500.  Comments/MD notified:n/a    Joanne Gavel

## 2016-05-14 NOTE — Care Management (Signed)
Admitted with congestive heart failure.  Is not requiring supplemental 02. Independent in all adls, denies issues accessing medical care, obtaining medications or with transportation.  Current with her PCP- Dr Guadelupe Sabin at [facility].  Referral to heart failure clinic

## 2016-05-14 NOTE — Progress Notes (Signed)
Inpatient Diabetes Program Recommendations  AACE/ADA: New Consensus Statement on Inpatient Glycemic Control (2015)  Target Ranges:  Prepandial:   less than 140 mg/dL      Peak postprandial:   less than 180 mg/dL (1-2 hours)      Critically ill patients:  140 - 180 mg/dL    Review of Glycemic Control  Outpatient Diabetes medications: Lantus 20 units QHS, Humalog 5-12 units TID with meals Current orders for Inpatient glycemic control: Lantus 20 units QHS, Novolog 0-9 units TID with meals, Novolog 0-5 units QHS  Inpatient Diabetes Program Recommendations: Insulin - Basal: Noted glucose 44 mg/dl this morning. Please consider decreasing Lantus to 17 units QHS. Insulin - Meal Coverage: If patient is eating at least 50% of meals, please consider ordering Novolog 3 units TID with meals for meal coverage. HgbA1C: A1c in process.  Thanks, Barnie Alderman, RN, MSN, CDE Diabetes Coordinator Inpatient Diabetes Program [phone] (Team Pager from 8am to 5pm)

## 2016-05-14 NOTE — Progress Notes (Signed)
[facility] NAME: Ludell Rogers    MRN#:  [id number]  DATE OF BIRTH:  01/23/1951  SUBJECTIVE:  Hospital Day: 0 days Theresa Rogers is a 65 y.o. female presenting with Leg Swelling .   Overnight events: No acute overnight events Interval Events: Still some leg swelling, still some fatigue but improving  REVIEW OF SYSTEMS:  CONSTITUTIONAL: No fever, fatigue or weakness.  EYES: No blurred or double vision.  EARS, NOSE, AND THROAT: No tinnitus or ear pain.  RESPIRATORY: No cough, shortness of breath, wheezing or hemoptysis.  CARDIOVASCULAR: No chest pain, orthopnea,Positive edema.  GASTROINTESTINAL: No nausea, vomiting, diarrhea or abdominal pain.  GENITOURINARY: No dysuria, hematuria.  ENDOCRINE: No polyuria, nocturia,  HEMATOLOGY: No anemia, easy bruising or bleeding SKIN: No rash or lesion. MUSCULOSKELETAL: No joint pain or arthritis.   NEUROLOGIC: No tingling, numbness, weakness.  PSYCHIATRY: No anxiety or depression.   DRUG ALLERGIES:   Allergies  Allergen Reactions  . Hydralazine Hcl Swelling    VITALS:  Blood pressure 126/72, pulse 72, temperature 98.2 F (36.8 C), temperature source Oral, resp. rate 16, height 5\' 4"  (1.626 m), weight 74.4 kg (164 lb), SpO2 95 %.  PHYSICAL EXAMINATION:  VITAL SIGNS: Vitals:   05/14/16 0912 05/14/16 1144  BP: 136/76 126/72  Pulse: 63 72  Resp: 16 16  Temp: 97.9 F (36.6 C) 98.2 F (36.8 C)   GENERAL:65 y.o.female currently in no acute distress.  HEAD: Normocephalic, atraumatic.  EYES: Pupils equal, round, reactive to light. Extraocular muscles intact. No scleral icterus.  MOUTH: Moist mucosal membrane. Dentition intact. No abscess noted.  EAR, NOSE, THROAT: Clear without exudates. No external lesions.  NECK: Supple. No thyromegaly. No nodules. No JVD.  PULMONARY: Diminished breath sounds without wheeze rails or rhonci. No use of accessory muscles, Good  respiratory effort. good air entry bilaterally CHEST: Nontender to palpation.  CARDIOVASCULAR: S1 and S2. Regular rate and rhythm. No murmurs, rubs, or gallops. 2+ edema. Pedal pulses 2+ bilaterally.  GASTROINTESTINAL: Soft, nontender, nondistended. No masses. Positive bowel sounds. No hepatosplenomegaly.  MUSCULOSKELETAL: No swelling, clubbing, or edema. Range of motion full in all extremities.  NEUROLOGIC: Cranial nerves II through XII are intact. No gross focal neurological deficits. Sensation intact. Reflexes intact.  SKIN: No ulceration, lesions, rashes, or cyanosis. Skin warm and dry. Turgor intact.  PSYCHIATRIC: Mood, affect within normal limits. The patient is awake, alert and oriented x 3. Insight, judgment intact.      LABORATORY PANEL:   CBC  Recent Labs Lab 05/14/16 0357  WBC 5.4  HGB 10.3*  HCT 31.2*  PLT 286   ------------------------------------------------------------------------------------------------------------------  Chemistries   Recent Labs Lab 05/14/16 0357  NA 136  K 3.9  CL 99*  CO2 31  GLUCOSE 81  BUN 60*  CREATININE 2.05*  CALCIUM 8.4*   ------------------------------------------------------------------------------------------------------------------  Cardiac Enzymes  Recent Labs Lab 05/13/16 1948  TROPONINI 0.68*   ------------------------------------------------------------------------------------------------------------------  RADIOLOGY:  Dg Chest 2 View  Result Date: 05/14/2016 CLINICAL DATA:  Congestive heart failure. Dilated cardiomyopathy. Productive cough. EXAM: CHEST  2 VIEW COMPARISON:  05/13/2016 FINDINGS: Improved aeration of both lungs is seen with decreased bibasilar atelectasis. Cardiomegaly and pulmonary vascular congestion are stable. Small bilateral pleural effusions show no significant change. No pneumothorax visualized. IMPRESSION: Improved aeration of both lungs with decreased bibasilar atelectasis. No significant  change in small bilateral pleural effusions. Stable cardiomegaly and pulmonary vascular congestion. Electronically Signed   By: Sharrie Rothman.D.  On: 05/14/2016 07:12   Dg Chest 2 View  Result Date: 05/13/2016 CLINICAL DATA:  Swelling bilateral lower extremities EXAM: CHEST  2 VIEW COMPARISON:  04/29/2016 FINDINGS: Borderline cardiomegaly. Bilateral small pleural effusion with bilateral basilar atelectasis or infiltrate. Again noted linear atelectasis, scarring or infiltrate in lingula. No convincing pulmonary edema. IMPRESSION: Bilateral small pleural effusion with bilateral basilar atelectasis or infiltrate. Again noted linear atelectasis, scarring or infiltrate in lingula. No convincing pulmonary edema. Electronically Signed   By: Lahoma Crocker M.D.   On: 05/13/2016 10:20    EKG:   Orders placed or performed during the hospital encounter of 05/13/16  . ED EKG  . ED EKG  . EKG 12-Lead  . EKG 12-Lead    ASSESSMENT AND PLAN:   Theresa Rogers is a 65 y.o. female presenting with Leg Swelling . Admitted 05/13/2016 : Day #: 0 days 1. Acute and chronic systolic congestive heart failure: Appreciate cardiology input with diuresis as tolerated 2. Acute kidney injury on chronic kidney disease: Nephrology consult, decrease IV Lasix 3. Essential hypertension 4. Type 2 diabetes insulin requiring continue basal insulin and sliding coverage decrease Lantus as recommended   All the records are reviewed and case discussed with Care Management/Social Workerr. Management plans discussed with the patient, family and they are in agreement.  CODE STATUS: full TOTAL TIME TAKING CARE OF THIS PATIENT: 33 minutes.   POSSIBLE D/C IN 2-3DAYS, DEPENDING ON CLINICAL CONDITION.   Theresa Rogers,  Theresa Rogers.D on 05/14/2016 at 1:47 PM  Between 7am to 6pm - Pager - [phone]  After 6pm: House Pager: - [phone]  [facility]  Office  [phone]  CC: Primary care physician; Pcp Not In System

## 2016-05-15 DIAGNOSIS — Z7982 Long term (current) use of aspirin: Secondary | ICD-10-CM | POA: Diagnosis not present

## 2016-05-15 DIAGNOSIS — I255 Ischemic cardiomyopathy: Secondary | ICD-10-CM | POA: Diagnosis present

## 2016-05-15 DIAGNOSIS — I130 Hypertensive heart and chronic kidney disease with heart failure and stage 1 through stage 4 chronic kidney disease, or unspecified chronic kidney disease: Principal | ICD-10-CM

## 2016-05-15 DIAGNOSIS — I214 Non-ST elevation (NSTEMI) myocardial infarction: Secondary | ICD-10-CM | POA: Diagnosis present

## 2016-05-15 DIAGNOSIS — J449 Chronic obstructive pulmonary disease, unspecified: Secondary | ICD-10-CM | POA: Diagnosis not present

## 2016-05-15 DIAGNOSIS — Z833 Family history of diabetes mellitus: Secondary | ICD-10-CM | POA: Diagnosis not present

## 2016-05-15 DIAGNOSIS — I25118 Atherosclerotic heart disease of native coronary artery with other forms of angina pectoris: Secondary | ICD-10-CM | POA: Diagnosis present

## 2016-05-15 DIAGNOSIS — Z841 Family history of disorders of kidney and ureter: Secondary | ICD-10-CM | POA: Diagnosis not present

## 2016-05-15 DIAGNOSIS — L03116 Cellulitis of left lower limb: Secondary | ICD-10-CM | POA: Diagnosis not present

## 2016-05-15 DIAGNOSIS — I252 Old myocardial infarction: Secondary | ICD-10-CM | POA: Diagnosis not present

## 2016-05-15 DIAGNOSIS — I272 Pulmonary hypertension, unspecified: Secondary | ICD-10-CM | POA: Diagnosis not present

## 2016-05-15 DIAGNOSIS — I2510 Atherosclerotic heart disease of native coronary artery without angina pectoris: Secondary | ICD-10-CM | POA: Diagnosis not present

## 2016-05-15 DIAGNOSIS — E785 Hyperlipidemia, unspecified: Secondary | ICD-10-CM | POA: Diagnosis not present

## 2016-05-15 DIAGNOSIS — E1165 Type 2 diabetes mellitus with hyperglycemia: Secondary | ICD-10-CM | POA: Diagnosis not present

## 2016-05-15 DIAGNOSIS — N184 Chronic kidney disease, stage 4 (severe): Secondary | ICD-10-CM | POA: Diagnosis not present

## 2016-05-15 DIAGNOSIS — N170 Acute kidney failure with tubular necrosis: Secondary | ICD-10-CM | POA: Diagnosis not present

## 2016-05-15 DIAGNOSIS — Z79899 Other long term (current) drug therapy: Secondary | ICD-10-CM | POA: Diagnosis not present

## 2016-05-15 DIAGNOSIS — I259 Chronic ischemic heart disease, unspecified: Secondary | ICD-10-CM | POA: Diagnosis not present

## 2016-05-15 DIAGNOSIS — Z951 Presence of aortocoronary bypass graft: Secondary | ICD-10-CM | POA: Diagnosis not present

## 2016-05-15 DIAGNOSIS — Z955 Presence of coronary angioplasty implant and graft: Secondary | ICD-10-CM | POA: Diagnosis not present

## 2016-05-15 DIAGNOSIS — E871 Hypo-osmolality and hyponatremia: Secondary | ICD-10-CM | POA: Diagnosis not present

## 2016-05-15 DIAGNOSIS — I5082 Biventricular heart failure: Secondary | ICD-10-CM | POA: Diagnosis not present

## 2016-05-15 DIAGNOSIS — I480 Paroxysmal atrial fibrillation: Secondary | ICD-10-CM | POA: Diagnosis not present

## 2016-05-15 DIAGNOSIS — E876 Hypokalemia: Secondary | ICD-10-CM | POA: Diagnosis not present

## 2016-05-15 DIAGNOSIS — Z7902 Long term (current) use of antithrombotics/antiplatelets: Secondary | ICD-10-CM | POA: Diagnosis not present

## 2016-05-15 DIAGNOSIS — Z8249 Family history of ischemic heart disease and other diseases of the circulatory system: Secondary | ICD-10-CM | POA: Diagnosis not present

## 2016-05-15 DIAGNOSIS — L03115 Cellulitis of right lower limb: Secondary | ICD-10-CM | POA: Diagnosis not present

## 2016-05-15 DIAGNOSIS — E11649 Type 2 diabetes mellitus with hypoglycemia without coma: Secondary | ICD-10-CM | POA: Diagnosis not present

## 2016-05-15 DIAGNOSIS — Z794 Long term (current) use of insulin: Secondary | ICD-10-CM | POA: Diagnosis not present

## 2016-05-15 DIAGNOSIS — I5043 Acute on chronic combined systolic (congestive) and diastolic (congestive) heart failure: Secondary | ICD-10-CM | POA: Diagnosis not present

## 2016-05-15 DIAGNOSIS — I481 Persistent atrial fibrillation: Secondary | ICD-10-CM | POA: Diagnosis not present

## 2016-05-15 DIAGNOSIS — Z01810 Encounter for preprocedural cardiovascular examination: Secondary | ICD-10-CM | POA: Diagnosis not present

## 2016-05-15 DIAGNOSIS — I509 Heart failure, unspecified: Secondary | ICD-10-CM | POA: Diagnosis present

## 2016-05-15 DIAGNOSIS — I1310 Hypertensive heart and chronic kidney disease without heart failure, with stage 1 through stage 4 chronic kidney disease, or unspecified chronic kidney disease: Secondary | ICD-10-CM

## 2016-05-15 DIAGNOSIS — E875 Hyperkalemia: Secondary | ICD-10-CM | POA: Diagnosis not present

## 2016-05-15 DIAGNOSIS — E1122 Type 2 diabetes mellitus with diabetic chronic kidney disease: Secondary | ICD-10-CM | POA: Diagnosis not present

## 2016-05-15 DIAGNOSIS — I517 Cardiomegaly: Secondary | ICD-10-CM | POA: Diagnosis not present

## 2016-05-15 DIAGNOSIS — I5022 Chronic systolic (congestive) heart failure: Secondary | ICD-10-CM | POA: Diagnosis not present

## 2016-05-15 DIAGNOSIS — R600 Localized edema: Secondary | ICD-10-CM | POA: Diagnosis not present

## 2016-05-15 DIAGNOSIS — N189 Chronic kidney disease, unspecified: Secondary | ICD-10-CM | POA: Diagnosis not present

## 2016-05-15 DIAGNOSIS — D62 Acute posthemorrhagic anemia: Secondary | ICD-10-CM | POA: Diagnosis not present

## 2016-05-15 DIAGNOSIS — I9581 Postprocedural hypotension: Secondary | ICD-10-CM | POA: Diagnosis not present

## 2016-05-15 DIAGNOSIS — N183 Chronic kidney disease, stage 3 (moderate): Secondary | ICD-10-CM | POA: Diagnosis present

## 2016-05-15 DIAGNOSIS — Z6825 Body mass index (BMI) 25.0-25.9, adult: Secondary | ICD-10-CM | POA: Diagnosis not present

## 2016-05-15 DIAGNOSIS — Z888 Allergy status to other drugs, medicaments and biological substances status: Secondary | ICD-10-CM | POA: Diagnosis not present

## 2016-05-15 DIAGNOSIS — I5023 Acute on chronic systolic (congestive) heart failure: Secondary | ICD-10-CM | POA: Diagnosis not present

## 2016-05-15 DIAGNOSIS — N179 Acute kidney failure, unspecified: Secondary | ICD-10-CM | POA: Diagnosis present

## 2016-05-15 LAB — ECHOCARDIOGRAM COMPLETE
Height: 64 in
WEIGHTICAEL: 2624 [oz_av]

## 2016-05-15 LAB — BASIC METABOLIC PANEL
ANION GAP: 8 (ref 5–15)
BUN: 63 mg/dL — ABNORMAL HIGH (ref 6–20)
CO2: 31 mmol/L (ref 22–32)
Calcium: 8.4 mg/dL — ABNORMAL LOW (ref 8.9–10.3)
Chloride: 95 mmol/L — ABNORMAL LOW (ref 101–111)
Creatinine, Ser: 1.91 mg/dL — ABNORMAL HIGH (ref 0.44–1.00)
GFR calc Af Amer: 31 mL/min — ABNORMAL LOW (ref >60)
GFR, EST NON AFRICAN AMERICAN: 27 mL/min — AB (ref >60)
GLUCOSE: 190 mg/dL — AB (ref 65–99)
POTASSIUM: 3.8 mmol/L (ref 3.5–5.1)
Sodium: 134 mmol/L — ABNORMAL LOW (ref 135–145)

## 2016-05-15 LAB — GLUCOSE, CAPILLARY
GLUCOSE-CAPILLARY: 76 mg/dL (ref 65–99)
GLUCOSE-CAPILLARY: 228 mg/dL — AB (ref 65–99)
Glucose-Capillary: 356 mg/dL — ABNORMAL HIGH (ref 65–99)
Glucose-Capillary: 339 mg/dL — ABNORMAL HIGH (ref 65–99)
Glucose-Capillary: 390 mg/dL — ABNORMAL HIGH (ref 65–99)

## 2016-05-15 LAB — HEMOGLOBIN A1C
Hgb A1c MFr Bld: 11.4 % — ABNORMAL HIGH (ref 4.8–5.6)
Mean Plasma Glucose: 280 mg/dL

## 2016-05-15 LAB — MRSA PCR SCREENING: MRSA by PCR: NEGATIVE

## 2016-05-15 MED ORDER — INSULIN ASPART 100 UNIT/ML ~~LOC~~ SOLN
3.0000 [IU] | Freq: Three times a day (TID) | SUBCUTANEOUS | Status: DC
  Administered 2016-05-15 – 2016-05-20 (×12): 3 [IU] via SUBCUTANEOUS
  Filled 2016-05-15 (×13): qty 3

## 2016-05-15 MED ORDER — MILRINONE LACTATE IN DEXTROSE 20-5 MG/100ML-% IV SOLN
0.2500 ug/kg/min | INTRAVENOUS | Status: DC
  Administered 2016-05-15 – 2016-05-20 (×5): 0.25 ug/kg/min via INTRAVENOUS
  Filled 2016-05-15 (×7): qty 100

## 2016-05-15 MED ORDER — INSULIN GLARGINE 100 UNIT/ML ~~LOC~~ SOLN
15.0000 [IU] | Freq: Every day | SUBCUTANEOUS | Status: DC
  Administered 2016-05-16 – 2016-05-18 (×4): 15 [IU] via SUBCUTANEOUS
  Filled 2016-05-15 (×6): qty 0.15

## 2016-05-15 NOTE — Progress Notes (Signed)
RN spoke with Dr. Lavetta Nielsen and asked about patient being stepdown level of care.  MD gave order for patient to be transferred to stepdown level of care.

## 2016-05-15 NOTE — Progress Notes (Signed)
Inpatient Diabetes Program Recommendations  AACE/ADA: New Consensus Statement on Inpatient Glycemic Control (2015)  Target Ranges:  Prepandial:   less than 140 mg/dL      Peak postprandial:   less than 180 mg/dL (1-2 hours)      Critically ill patients:  140 - 180 mg/dL   Lab Results  Component Value Date   GLUCAP 228 (H) 05/15/2016   HGBA1C 11.4 (H) 05/14/2016    Review of Glycemic ControlResults for Theresa Rogers, Theresa Rogers (MRN [id number]) as of 05/15/2016 11:51  Ref. Range 05/14/2016 05:50 05/14/2016 06:17 05/14/2016 11:28 05/14/2016 16:41 05/14/2016 21:07 05/15/2016 07:38 05/15/2016 11:17  Glucose-Capillary Latest Ref Range: 65 - 99 mg/dL 44 (LL) 86 141 (H) 210 (H) 304 (H) 76 228 (H)   Note that A1C indicates poor control of diabetes in the past 2-3 months.  May consider reducing Lantus to 15 units q HS and adding Novolog meal coverage 3 units tid with meals (hold if patient eats less than 50%).  Will follow.  Thanks, Adah Perl, RN, BC-ADM Inpatient Diabetes Coordinator Pager [phone] (8a-5p)

## 2016-05-15 NOTE — Progress Notes (Signed)
eLink Physician-Brief Progress Note Patient Name: Merinda Victorino DOB: 1950/09/18 MRN: [id number]   Date of Service  05/15/2016  HPI/Events of Note  New patient evaluation  eICU Interventions  Nothing further to add     Intervention Category Major Interventions: Other:  Javaria Knapke 05/15/2016, 4:06 PM

## 2016-05-15 NOTE — Progress Notes (Signed)
   Discussed with Dr. Fletcher Anon, MD. Plan to transfer patient to ICU for milrinone infusion with diuresis. Close monitoring of renal function. Plan for possible cardiac cath on Monday, 05/20/16, with limited contrast. Orders placed and discussed with RN.

## 2016-05-15 NOTE — Progress Notes (Signed)
[facility]  ROUNDING NOTE   Subjective:   UOP 1750 Furosemide 20mg  IV q12  Hemoglobin A1c 11.4%  Creatinine 1.91 (2.05)  Objective:  Vital signs in last 24 hours:  Temp:  [97.6 F (36.4 C)-98.5 F (36.9 C)] 98.2 F (36.8 C) (12/06 1115) Pulse Rate:  [61-72] 63 (12/06 1115) Resp:  [16-18] 16 (12/06 1115) BP: (109-152)/(62-81) 152/79 (12/06 1115) SpO2:  [89 %-95 %] 93 % (12/06 1115) Weight:  [75.4 kg (166 lb 4.8 oz)] 75.4 kg (166 lb 4.8 oz) (12/06 0500)  Weight change: 7.394 kg (16 lb 4.8 oz) Filed Weights   05/13/16 1331 05/14/16 0405 05/15/16 0500  Weight: 74.6 kg (164 lb 8 oz) 74.4 kg (164 lb) 75.4 kg (166 lb 4.8 oz)    Intake/Output: I/O last 3 completed shifts: In: 70 [P.O.:960] Out: 2175 [Urine:2175]   Intake/Output this shift:  Total I/O In: 480 [P.O.:480] Out: 350 [Urine:350]  Physical Exam: General: NAD, laying in bed  Head: Normocephalic, atraumatic. Moist oral mucosal membranes  Eyes: Anicteric, PERRL  Neck: Supple, trachea midline  Lungs:  Clear to auscultation  Heart: Regular rate and rhythm  Abdomen:  Soft, nontender,   Extremities:  1+ peripheral edema.  Neurologic: Nonfocal, moving all four extremities  Skin: No lesions       Basic Metabolic Panel:  Recent Labs Lab 05/13/16 0855 05/14/16 0357 05/15/16 0420  NA 134* 136 134*  K 4.7 3.9 3.8  CL 96* 99* 95*  CO2 28 31 31   GLUCOSE 340* 81 190*  BUN 51* 60* 63*  CREATININE 1.81* 2.05* 1.91*  CALCIUM 8.6* 8.4* 8.4*    Liver Function Tests: No results for input(s): AST, ALT, ALKPHOS, BILITOT, PROT, ALBUMIN in the last 168 hours. No results for input(s): LIPASE, AMYLASE in the last 168 hours. No results for input(s): AMMONIA in the last 168 hours.  CBC:  Recent Labs Lab 05/13/16 0855 05/14/16 0357  WBC 7.0 5.4  HGB 12.2 10.3*  HCT 38.0 31.2*  MCV 82.3 81.7  PLT 316 286    Cardiac Enzymes:  Recent Labs Lab 05/13/16 0905 05/13/16 1137 05/13/16 1538  05/13/16 1948  TROPONINI 0.51* 0.46* 0.56* 0.68*    BNP: Invalid input(s): POCBNP  CBG:  Recent Labs Lab 05/14/16 1128 05/14/16 1641 05/14/16 2107 05/15/16 0738 05/15/16 1117  GLUCAP 141* 210* 304* 76 228*    Microbiology: No results found for this or any previous visit.  Coagulation Studies: No results for input(s): LABPROT, INR in the last 72 hours.  Urinalysis:  Recent Labs  05/13/16 1136  COLORURINE YELLOW*  LABSPEC 1.012  PHURINE 5.0  GLUCOSEU >500*  HGBUR 1+*  BILIRUBINUR NEGATIVE  KETONESUR TRACE*  PROTEINUR >500*  NITRITE NEGATIVE  LEUKOCYTESUR NEGATIVE      Imaging: Dg Chest 2 View  Result Date: 05/14/2016 CLINICAL DATA:  Congestive heart failure. Dilated cardiomyopathy. Productive cough. EXAM: CHEST  2 VIEW COMPARISON:  05/13/2016 FINDINGS: Improved aeration of both lungs is seen with decreased bibasilar atelectasis. Cardiomegaly and pulmonary vascular congestion are stable. Small bilateral pleural effusions show no significant change. No pneumothorax visualized. IMPRESSION: Improved aeration of both lungs with decreased bibasilar atelectasis. No significant change in small bilateral pleural effusions. Stable cardiomegaly and pulmonary vascular congestion. Electronically Signed   By: Earle Gell M.D.   On: 05/14/2016 07:12     Medications:    . aspirin  81 mg Oral Daily  . atorvastatin  40 mg Oral q1800  . clopidogrel  75 mg Oral Daily  .  furosemide  20 mg Intravenous Q12H  . heparin  5,000 Units Subcutaneous Q8H  . insulin aspart  0-5 Units Subcutaneous QHS  . insulin aspart  0-9 Units Subcutaneous TID WC  . insulin glargine  17 Units Subcutaneous QHS  . isosorbide mononitrate  30 mg Oral BID  . losartan  50 mg Oral Daily  . metoprolol tartrate  75 mg Oral BID  . sodium chloride flush  3 mL Intravenous Q12H     Assessment/ Plan:  [name] is a 65 y.o. white female with coronary artery disease, diabetes mellitus type II,  congestive heart failure, atrial fibrillation, anemia, hypertension, hyperlipidemia  1. Acute renal failure on Chronic kidney disease stage III with proteinuria: chronic kidney disease secondary to hypertension and diabetes. Baseline creatinine of 1.6 GFR of 34 on 3/17 Acute renal failure from acute cardio-renal syndrome.  - Continue IV furosemide.  - continue losartan  2. Hypertension and acute on chronic systolic congestive heart failure with EF of 20-25% Blood pressure at goal. - metoprolol, losartan and furosemide.   3. Diabetes mellitus type II with chronic kidney disease: insulin dependent. History of not well controlled.  Not well controlled. Hemoglobin A1c 11.4%   LOS: 0 Theresa Rogers 12/6/201711:36 AM

## 2016-05-15 NOTE — Progress Notes (Signed)
[facility] [place] NAME: Theresa Rogers    MRN#:  [id number]  DATE OF BIRTH:  11/22/1950  SUBJECTIVE:  Hospital Day: 0 days Theresa Rogers is a 65 y.o. female presenting with Leg Swelling .   Overnight events: No acute overnight events Interval Events: Still some leg swelling But improving  REVIEW OF SYSTEMS:  CONSTITUTIONAL: No fever, fatigue or weakness.  EYES: No blurred or double vision.  EARS, NOSE, AND THROAT: No tinnitus or ear pain.  RESPIRATORY: No cough, shortness of breath, wheezing or hemoptysis.  CARDIOVASCULAR: No chest pain, orthopnea,Positive edema.  GASTROINTESTINAL: No nausea, vomiting, diarrhea or abdominal pain.  GENITOURINARY: No dysuria, hematuria.  ENDOCRINE: No polyuria, nocturia,  HEMATOLOGY: No anemia, easy bruising or bleeding SKIN: No rash or lesion. MUSCULOSKELETAL: No joint pain or arthritis.   NEUROLOGIC: No tingling, numbness, weakness.  PSYCHIATRY: No anxiety or depression.   DRUG ALLERGIES:   Allergies  Allergen Reactions  . Hydralazine Hcl Swelling    VITALS:  Blood pressure (!) 152/79, pulse 63, temperature 98.2 F (36.8 C), temperature source Oral, resp. rate 16, height 5\' 4"  (1.626 m), weight 75.4 kg (166 lb 4.8 oz), SpO2 93 %.  PHYSICAL EXAMINATION:  VITAL SIGNS: Vitals:   05/15/16 1024 05/15/16 1115  BP:  (!) 152/79  Pulse: 65 63  Resp:  16  Temp:  98.2 F (36.8 C)   GENERAL:65 y.o.female currently in no acute distress.  HEAD: Normocephalic, atraumatic.  EYES: Pupils equal, round, reactive to light. Extraocular muscles intact. No scleral icterus.  MOUTH: Moist mucosal membrane. Dentition intact. No abscess noted.  EAR, NOSE, THROAT: Clear without exudates. No external lesions.  NECK: Supple. No thyromegaly. No nodules. No JVD.  PULMONARY: Diminished breath sounds without wheeze rails or rhonci. No use of accessory muscles, Good respiratory effort. good air entry  bilaterally CHEST: Nontender to palpation.  CARDIOVASCULAR: S1 and S2. Regular rate and rhythm. No murmurs, rubs, or gallops. 1+ edema. Pedal pulses 2+ bilaterally.  GASTROINTESTINAL: Soft, nontender, nondistended. No masses. Positive bowel sounds. No hepatosplenomegaly.  MUSCULOSKELETAL: No swelling, clubbing, or edema. Range of motion full in all extremities.  NEUROLOGIC: Cranial nerves II through XII are intact. No gross focal neurological deficits. Sensation intact. Reflexes intact.  SKIN: No ulceration, lesions, rashes, or cyanosis. Skin warm and dry. Turgor intact.  PSYCHIATRIC: Mood, affect within normal limits. The patient is awake, alert and oriented x 3. Insight, judgment intact.      LABORATORY PANEL:   CBC  Recent Labs Lab 05/14/16 0357  WBC 5.4  HGB 10.3*  HCT 31.2*  PLT 286   ------------------------------------------------------------------------------------------------------------------  Chemistries   Recent Labs Lab 05/15/16 0420  NA 134*  K 3.8  CL 95*  CO2 31  GLUCOSE 190*  BUN 63*  CREATININE 1.91*  CALCIUM 8.4*   ------------------------------------------------------------------------------------------------------------------  Cardiac Enzymes  Recent Labs Lab 05/13/16 1948  TROPONINI 0.68*   ------------------------------------------------------------------------------------------------------------------  RADIOLOGY:  Dg Chest 2 View  Result Date: 05/14/2016 CLINICAL DATA:  Congestive heart failure. Dilated cardiomyopathy. Productive cough. EXAM: CHEST  2 VIEW COMPARISON:  05/13/2016 FINDINGS: Improved aeration of both lungs is seen with decreased bibasilar atelectasis. Cardiomegaly and pulmonary vascular congestion are stable. Small bilateral pleural effusions show no significant change. No pneumothorax visualized. IMPRESSION: Improved aeration of both lungs with decreased bibasilar atelectasis. No significant change in small bilateral  pleural effusions. Stable cardiomegaly and pulmonary vascular congestion. Electronically Signed   By: Sharrie Rothman.D.  On: 05/14/2016 07:12    EKG:   Orders placed or performed during the hospital encounter of 05/13/16  . ED EKG  . ED EKG  . EKG 12-Lead  . EKG 12-Lead    ASSESSMENT AND PLAN:   Theresa Rogers is a 65 y.o. female presenting with Leg Swelling . Admitted 05/13/2016 : Day #: 0 days 1. Acute and chronic systolic congestive heart failure: Appreciate cardiology input Noted plans for milrinone infusion   2. Acute kidney injury on chronic kidney disease: Nephrology input appreciated  3. Essential hypertension 4. Type 2 diabetes insulin requiring continue basal insulin and sliding coverage decrease Lantus as recommended   All the records are reviewed and case discussed with Care Management/Social Workerr. Management plans discussed with the patient, family and they are in agreement.  CODE STATUS: full TOTAL TIME TAKING CARE OF THIS PATIENT: 28 minutes.   POSSIBLE D/C IN 2-3DAYS, DEPENDING ON CLINICAL CONDITION.   Hower,  [name].D on 05/15/2016 at 3:04 PM  Between 7am to 6pm - Pager - [phone]  After 6pm: House Pager: - [phone]  [name]  Office  [phone]  CC: Primary care physician; Pcp Not In System

## 2016-05-15 NOTE — Progress Notes (Signed)
Results for COY, VANDOREN (MRN [id number]) as of 05/15/2016 14:57  Ref. Range 05/14/2016 03:57  Hemoglobin A1C Latest Ref Range: 4.8 - 5.6 % 11.4 (H)   Spoke with patient regarding A1C.  She was aware of the value.  She states that blood sugars vary at home and that they are usually low in the morning and high in the evening.  She has an appointment with MD in [facility] as she has recently moved here from [place].  Discussed A1C results and goal.  She seems knowledgeable of insulins.  ? Whether she needs the addition of CHO coverage with meals to prevent spikes in blood sugars after eating.  [facility] to [facility] to work with CDE on improved control and possibly CHO counting.  Will order "Living Well with Diabetes booklet"  For patient.    Please consider adding Novolog meal coverage 3 units tid with meals while in the hospital.  Also may need slight decrease in Lantus to 15 units q HS.   Thanks, Adah Perl, RN, BC-ADM Inpatient Diabetes Coordinator Pager [phone] (8a-5p)

## 2016-05-15 NOTE — Progress Notes (Signed)
Patient Name: Theresa Rogers Date of Encounter: 05/15/2016  Primary Cardiologist: [facility] Problem List     Principal Problem:   Acute on chronic systolic CHF (congestive heart failure) (HCC) Active Problems:   Bilateral lower extremity edema   Dyspnea   Coronary artery disease involving coronary bypass graft of native heart without angina pectoris   Cardiomyopathy, ischemic   Systolic dysfunction   Acute renal failure superimposed on stage 3 chronic kidney disease ([place])   Poorly controlled type 2 diabetes mellitus with complication (HCC)     Subjective   Breathing much better this morning. LE swelling improving. Echo this admission showed worsening EF at 20-25% (previously 30-35%), severe global HK likely with regions of AK in the anterior, anteroseptal, and apical regions. Wall motion of basal inferior/posterior/lateral wall best preserved, RWMA cannot be excluded, GR1DD, mild MR, LA moderately dilated, PASP 54 mmHg, trivial pericardial effusion. Renal function improved from 2-->1.9 this morning. Better UOP over the past 24 hours. Minus 1.3 L for the admission.   Inpatient Medications    Scheduled Meds: . aspirin  81 mg Oral Daily  . atorvastatin  40 mg Oral q1800  . clopidogrel  75 mg Oral Daily  . furosemide  20 mg Intravenous Q12H  . heparin  5,000 Units Subcutaneous Q8H  . insulin aspart  0-5 Units Subcutaneous QHS  . insulin aspart  0-9 Units Subcutaneous TID WC  . insulin glargine  17 Units Subcutaneous QHS  . isosorbide mononitrate  30 mg Oral BID  . losartan  50 mg Oral Daily  . metoprolol tartrate  75 mg Oral BID  . sodium chloride flush  3 mL Intravenous Q12H   Continuous Infusions:  PRN Meds:    Vital Signs    Vitals:   05/14/16 1919 05/14/16 2153 05/15/16 0346 05/15/16 0500  BP: 140/81  109/62   Pulse: 62 69 61   Resp: 18  17   Temp: 98.5 F (36.9 C)  97.6 F (36.4 C)   TempSrc: Oral     SpO2: 91%  (!) 89% 94%  Weight:    166 lb 4.8  oz (75.4 kg)  Height:        Intake/Output Summary (Last 24 hours) at 05/15/16 0829 Last data filed at 05/15/16 0700  Gross per 24 hour  Intake              960 ml  Output             1750 ml  Net             -790 ml   Filed Weights   05/13/16 1331 05/14/16 0405 05/15/16 0500  Weight: 164 lb 8 oz (74.6 kg) 164 lb (74.4 kg) 166 lb 4.8 oz (75.4 kg)    Physical Exam    GEN: Well nourished, well developed, in no acute distress.  HEENT: Grossly normal.  Neck: Supple, no JVD, carotid bruits, or masses. Cardiac: RRR, no murmurs, rubs, or gallops. No clubbing, cyanosis. LE edema much improved, still with trace pre-tibial edema.  Radials/DP/PT 2+ and equal bilaterally.  Respiratory:  Respirations regular and unlabored, clear to auscultation bilaterally. GI: Soft, nontender, nondistended, BS + x 4. MS: no deformity or atrophy. Skin: warm and dry, no rash. Neuro:  Strength and sensation are intact. Psych: AAOx3.  Normal affect.  Labs    CBC  Recent Labs  05/13/16 0855 05/14/16 0357  WBC 7.0 5.4  HGB 12.2 10.3*  HCT 38.0 31.2*  MCV 82.3 81.7  PLT 316 423   Basic Metabolic Panel  Recent Labs  05/14/16 0357 05/15/16 0420  NA 136 134*  K 3.9 3.8  CL 99* 95*  CO2 31 31  GLUCOSE 81 190*  BUN 60* 63*  CREATININE 2.05* 1.91*  CALCIUM 8.4* 8.4*   Liver Function Tests No results for input(s): AST, ALT, ALKPHOS, BILITOT, PROT, ALBUMIN in the last 72 hours. No results for input(s): LIPASE, AMYLASE in the last 72 hours. Cardiac Enzymes  Recent Labs  05/13/16 1137 05/13/16 1538 05/13/16 1948  TROPONINI 0.46* 0.56* 0.68*   BNP Invalid input(s): POCBNP D-Dimer No results for input(s): DDIMER in the last 72 hours. Hemoglobin A1C  Recent Labs  05/14/16 0357  HGBA1C 11.4*   Fasting Lipid Panel No results for input(s): CHOL, HDL, LDLCALC, TRIG, CHOLHDL, LDLDIRECT in the last 72 hours. Thyroid Function Tests No results for input(s): TSH, T4TOTAL, T3FREE, THYROIDAB  in the last 72 hours.  Invalid input(s): FREET3  Telemetry    NSR, 60's to 70's bpm - Personally Reviewed  ECG    n/a - Personally Reviewed  Radiology    Dg Chest 2 View  Result Date: 05/14/2016 CLINICAL DATA:  Congestive heart failure. Dilated cardiomyopathy. Productive cough. EXAM: CHEST  2 VIEW COMPARISON:  05/13/2016 FINDINGS: Improved aeration of both lungs is seen with decreased bibasilar atelectasis. Cardiomegaly and pulmonary vascular congestion are stable. Small bilateral pleural effusions show no significant change. No pneumothorax visualized. IMPRESSION: Improved aeration of both lungs with decreased bibasilar atelectasis. No significant change in small bilateral pleural effusions. Stable cardiomegaly and pulmonary vascular congestion. Electronically Signed   By: Earle Gell M.D.   On: 05/14/2016 07:12   Dg Chest 2 View  Result Date: 05/13/2016 CLINICAL DATA:  Swelling bilateral lower extremities EXAM: CHEST  2 VIEW COMPARISON:  04/29/2016 FINDINGS: Borderline cardiomegaly. Bilateral small pleural effusion with bilateral basilar atelectasis or infiltrate. Again noted linear atelectasis, scarring or infiltrate in lingula. No convincing pulmonary edema. IMPRESSION: Bilateral small pleural effusion with bilateral basilar atelectasis or infiltrate. Again noted linear atelectasis, scarring or infiltrate in lingula. No convincing pulmonary edema. Electronically Signed   By: Lahoma Crocker M.D.   On: 05/13/2016 10:20    Cardiac Studies   Echo 05/14/16: Study Conclusions  - Left ventricle: The cavity size was mildly dilated. Systolic   function was severely reduced. The estimated ejection fraction   was in the range of 20%. Severe global hypokinesis, likely with   regions of akinesis in the anterior, anteroseptal and apical   regions. Wall motion of basal inferior/posterior/lateral wall   best preserved though still depressed. Regional wall motion   abnormalities cannot be excluded.  Doppler parameters are   consistent with abnormal left ventricular relaxation (grade 1   diastolic dysfunction). - Aortic valve: Valve area (Vmax): 1.36 cm^2. - Mitral valve: There was mild regurgitation. - Left atrium: The atrium was moderately dilated. - Right ventricle: Systolic function was moderately reduced. - Pulmonary arteries: PA peak pressure: 53 mm Hg (S). - Pericardium, extracardiac: A trivial pericardial effusion was   identified.  Impressions:  - Left pleural effusion noted.  Patient Profile     65 y.o. female with history of CAD s/p remote PCI in 2009 s/p NSTEMI with PCI/DES to mid LCx in 06/2015, ICM/chronic systolic CHF, PAF (diagnosed 06/2015), DM2, CKD stage III, HTN, HLD, and anemia who presented to [facility] on 12/4 with increase LE swelling found to have cardiorenal syndrome.   Assessment & Plan  1. Acute on chronic combined CHF/ICM/cardiorenal syndrome: -Diuresed better over the past 24 hours with UOP of 1.3 L for the admission -Renal function stable on IV Lasix 20 mg bid -Left pleural effusion noted on echo -Discussing with primary cardiologist given the above findings to determine short-term and long-term treatment course. May need transfer to ICU for pressors in an effort to better diurese and possibly improve renal function in preparation for possible cardiac cath given her above echo. May need transfer to [facility] given multiple vessel CAD and high-risk cardiac cath -For now, continue IV Lasix 20 mg bid with KCl repletion -Metoprolol  2. CAD/elevated troponin: -As above -No chest pain -Mild troponin elevation likely in the setting of #1 -Continue DAPT with ASA and Plavix -Continue Imdur and metoprolol   3. Acute on CKD stage III secondary to cardiorenal syndrome: -Renal on board -Renal function stable today -Diuresis per Renal  4. PAF: -Never felt palpitations -Not on full-dose anticoagulation given CKD, anemia, need for DAPT, and has been maintaining  sinus rhythm at follow ups -Consider cardiac monitoring -Currently in sinus rhythm  5. HLD: -Lipitor  6. HTN: -Stable  Signed, Christell Faith, PA-C [facility] Pager: [phone] 05/15/2016, 8:29 AM

## 2016-05-15 NOTE — Progress Notes (Signed)
Report given to [place] CCU RN. VSS. No distress at this time. Will  transport patient to CCU.

## 2016-05-16 LAB — GLUCOSE, CAPILLARY
GLUCOSE-CAPILLARY: 346 mg/dL — AB (ref 65–99)
GLUCOSE-CAPILLARY: 120 mg/dL — AB (ref 65–99)
Glucose-Capillary: 241 mg/dL — ABNORMAL HIGH (ref 65–99)
Glucose-Capillary: 325 mg/dL — ABNORMAL HIGH (ref 65–99)

## 2016-05-16 LAB — BASIC METABOLIC PANEL
Anion gap: 8 (ref 5–15)
BUN: 65 mg/dL — ABNORMAL HIGH (ref 6–20)
CO2: 28 mmol/L (ref 22–32)
Calcium: 8.1 mg/dL — ABNORMAL LOW (ref 8.9–10.3)
Chloride: 98 mmol/L — ABNORMAL LOW (ref 101–111)
Creatinine, Ser: 1.75 mg/dL — ABNORMAL HIGH (ref 0.44–1.00)
GFR calc Af Amer: 34 mL/min — ABNORMAL LOW (ref >60)
GFR calc non Af Amer: 30 mL/min — ABNORMAL LOW (ref >60)
Glucose, Bld: 259 mg/dL — ABNORMAL HIGH (ref 65–99)
Potassium: 3.7 mmol/L (ref 3.5–5.1)
Sodium: 134 mmol/L — ABNORMAL LOW (ref 135–145)

## 2016-05-16 LAB — HEMOGLOBIN A1C
Hgb A1c MFr Bld: 11.3 % — ABNORMAL HIGH (ref 4.8–5.6)
Mean Plasma Glucose: 278 mg/dL

## 2016-05-16 MED ORDER — FUROSEMIDE 10 MG/ML IJ SOLN
40.0000 mg | Freq: Two times a day (BID) | INTRAMUSCULAR | Status: DC
  Administered 2016-05-16 – 2016-05-19 (×6): 40 mg via INTRAVENOUS
  Filled 2016-05-16 (×6): qty 4

## 2016-05-16 MED ORDER — ONDANSETRON HCL 4 MG/2ML IJ SOLN
4.0000 mg | Freq: Four times a day (QID) | INTRAMUSCULAR | Status: DC | PRN
  Administered 2016-05-16: 4 mg via INTRAVENOUS
  Filled 2016-05-16: qty 2

## 2016-05-16 MED ORDER — POTASSIUM CHLORIDE CRYS ER 20 MEQ PO TBCR
20.0000 meq | EXTENDED_RELEASE_TABLET | Freq: Two times a day (BID) | ORAL | Status: DC
  Administered 2016-05-16 – 2016-05-20 (×9): 20 meq via ORAL
  Filled 2016-05-16 (×9): qty 1

## 2016-05-16 MED ORDER — CARVEDILOL 6.25 MG PO TABS
12.5000 mg | ORAL_TABLET | Freq: Two times a day (BID) | ORAL | Status: DC
  Administered 2016-05-16 – 2016-05-18 (×4): 12.5 mg via ORAL
  Filled 2016-05-16 (×4): qty 2

## 2016-05-16 NOTE — Progress Notes (Signed)
Report given to Velna Hatchet, RN who is now taking over patient's care.

## 2016-05-16 NOTE — Progress Notes (Signed)
Pt is alert and oriented, no complaints of pain.  NSR, currently on room air, 2L [place] at times.  Pt up to bathroom voiding multiple times, remains on Milnirone gtt.  VSS, afebrile.

## 2016-05-16 NOTE — Progress Notes (Signed)
[facility]  ROUNDING NOTE   Subjective:   Transferred to ICU. On milrinone gtt Continues to have pulmonary edema Furosemide increased to 40mg  IV q12 UOP 1650  Creatinine 1.75 (1.91)  Objective:  Vital signs in last 24 hours:  Temp:  [97.8 F (36.6 C)-98.2 F (36.8 C)] 97.8 F (36.6 C) (12/07 0800) Pulse Rate:  [60-71] 69 (12/07 0937) Resp:  [13-29] 13 (12/07 0937) BP: (124-159)/(60-90) 148/62 (12/07 0937) SpO2:  [87 %-96 %] 91 % (12/07 0937) Weight:  [74.9 kg (165 lb 2 oz)] 74.9 kg (165 lb 2 oz) (12/07 0500)  Weight change: -0.533 kg (-1 lb 2.8 oz) Filed Weights   05/14/16 0405 05/15/16 0500 05/16/16 0500  Weight: 74.4 kg (164 lb) 75.4 kg (166 lb 4.8 oz) 74.9 kg (165 lb 2 oz)    Intake/Output: I/O last 3 completed shifts: In: 1405.5 [P.O.:1320; I.V.:85.5] Out: 1650 [Urine:1650]   Intake/Output this shift:  Total I/O In: 11.4 [I.V.:11.4] Out: -   Physical Exam: General: NAD, laying in bed  Head: Normocephalic, atraumatic. Moist oral mucosal membranes  Eyes: Anicteric, PERRL  Neck: Supple, trachea midline  Lungs:  Clear to auscultation  Heart: Regular rate and rhythm  Abdomen:  Soft, nontender,   Extremities:  1+ peripheral edema, left greater than right  Neurologic: Nonfocal, moving all four extremities  Skin: No lesions       Basic Metabolic Panel:  Recent Labs Lab 05/13/16 0855 05/14/16 0357 05/15/16 0420 05/16/16 0451  NA 134* 136 134* 134*  K 4.7 3.9 3.8 3.7  CL 96* 99* 95* 98*  CO2 28 31 31 28   GLUCOSE 340* 81 190* 259*  BUN 51* 60* 63* 65*  CREATININE 1.81* 2.05* 1.91* 1.75*  CALCIUM 8.6* 8.4* 8.4* 8.1*    Liver Function Tests: No results for input(s): AST, ALT, ALKPHOS, BILITOT, PROT, ALBUMIN in the last 168 hours. No results for input(s): LIPASE, AMYLASE in the last 168 hours. No results for input(s): AMMONIA in the last 168 hours.  CBC:  Recent Labs Lab 05/13/16 0855 05/14/16 0357  WBC 7.0 5.4  HGB 12.2 10.3*   HCT 38.0 31.2*  MCV 82.3 81.7  PLT 316 286    Cardiac Enzymes:  Recent Labs Lab 05/13/16 0905 05/13/16 1137 05/13/16 1538 05/13/16 1948  TROPONINI 0.51* 0.46* 0.56* 0.68*    BNP: Invalid input(s): POCBNP  CBG:  Recent Labs Lab 05/15/16 1117 05/15/16 1523 05/15/16 1703 05/15/16 2124 05/16/16 0722  GLUCAP 228* 356* 339* 390* 70*    Microbiology: Results for orders placed or performed during the hospital encounter of 05/13/16  MRSA PCR Screening     Status: None   Collection Time: 05/15/16  7:02 PM  Result Value Ref Range Status   MRSA by PCR NEGATIVE NEGATIVE Final    Comment:        The GeneXpert MRSA Assay (FDA approved for NASAL specimens only), is one component of a comprehensive MRSA colonization surveillance program. It is not intended to diagnose MRSA infection nor to guide or monitor treatment for MRSA infections.     Coagulation Studies: No results for input(s): LABPROT, INR in the last 72 hours.  Urinalysis:  Recent Labs  05/13/16 1136  COLORURINE YELLOW*  LABSPEC 1.012  PHURINE 5.0  GLUCOSEU >500*  HGBUR 1+*  BILIRUBINUR NEGATIVE  KETONESUR TRACE*  PROTEINUR >500*  NITRITE NEGATIVE  LEUKOCYTESUR NEGATIVE      Imaging: No results found.   Medications:   . milrinone 0.25 mcg/kg/min (05/16/16 0700)   .  aspirin  81 mg Oral Daily  . atorvastatin  40 mg Oral q1800  . carvedilol  12.5 mg Oral BID WC  . clopidogrel  75 mg Oral Daily  . furosemide  40 mg Intravenous Q12H  . heparin  5,000 Units Subcutaneous Q8H  . insulin aspart  0-5 Units Subcutaneous QHS  . insulin aspart  0-9 Units Subcutaneous TID WC  . insulin aspart  3 Units Subcutaneous TID WC  . insulin glargine  15 Units Subcutaneous QHS  . isosorbide mononitrate  30 mg Oral BID  . losartan  50 mg Oral Daily  . potassium chloride  20 mEq Oral BID  . sodium chloride flush  3 mL Intravenous Q12H     Assessment/ Plan:  [name] is a 65 y.o. white  female with coronary artery disease, diabetes mellitus type II, congestive heart failure, atrial fibrillation, anemia, hypertension, hyperlipidemia  1. Acute renal failure on Chronic kidney disease stage III with proteinuria: chronic kidney disease secondary to hypertension and diabetes. Baseline creatinine of 1.6 GFR of 34 on 3/17 Acute renal failure from acute cardio-renal syndrome.  - Continue IV furosemide.  - continue losartan  2. Hypertension and acute on chronic systolic congestive heart failure with EF of 20-25%. Atrial fibrillation Blood pressure at goal. - metoprolol, losartan and furosemide.  - placed on milrinone   3. Diabetes mellitus type II with chronic kidney disease: insulin dependent. History of not well controlled.  Not well controlled. Hemoglobin A1c 11.4%   LOS: 1 Theresa Rogers 12/7/20179:49 AM

## 2016-05-16 NOTE — Progress Notes (Signed)
Inpatient Diabetes Program Recommendations  AACE/ADA: New Consensus Statement on Inpatient Glycemic Control (2015)  Target Ranges:  Prepandial:   less than 140 mg/dL      Peak postprandial:   less than 180 mg/dL (1-2 hours)      Critically ill patients:  140 - 180 mg/dL   Lab Results  Component Value Date   GLUCAP 241 (H) 05/16/2016   HGBA1C 11.3 (H) 05/15/2016    Review of Glycemic Control    Diabetes history: Type 2, A1C 11.4% Outpatient Diabetes medications: Lantus 20 units qhs, Humalog 5-12 units tid Current orders for Inpatient glycemic control: Lantus 15 units qhs, Novolog 3 units tid, Novolog 0-9 units tid, Novolog 0-5 units qhs  Inpatient Diabetes Program Recommendations: Agree with current medications for blood sugar management. Although she has slightly elevated fasting blood sugars today, I have not recommended we increase basal insulin because she had hypoglycemia in the am with higher doses of Lantus,   Gentry Fitz, RN, [place], [place], CDE Diabetes Coordinator Inpatient Diabetes Program  [phone] (Team Pager) [phone] ([place]) 05/16/2016 7:49 AM

## 2016-05-16 NOTE — Progress Notes (Signed)
Patient Name: Theresa Rogers Date of Encounter: 05/16/2016  Primary Cardiologist: [facility] Problem List     Principal Problem:   Acute on chronic systolic CHF (congestive heart failure) ([place]) Active Problems:   Bilateral lower extremity edema   Dyspnea   Coronary artery disease involving coronary bypass graft of native heart without angina pectoris   Cardiomyopathy, ischemic   Systolic dysfunction   Acute renal failure superimposed on stage 3 chronic kidney disease ([place])   Poorly controlled type 2 diabetes mellitus with complication ([place])   Cardiorenal syndrome   Heart failure ([place])     Subjective   Transferred to stepdown on 12/6 for initiation of milrinone infusion with diuresis. Breathing better this morning. UOP not accurate as she has been voiding in the toilet. Reports seh was up voiding all night. Renal function improved to 1.75 today from 1.9 24 hours ago and a peak this admission of 2 on 05/14/16. Echo with further reduction in EF as below. Planning for possible cardiac cath on Monday, 12/11.   Inpatient Medications    Scheduled Meds: . aspirin  81 mg Oral Daily  . atorvastatin  40 mg Oral q1800  . clopidogrel  75 mg Oral Daily  . furosemide  20 mg Intravenous Q12H  . heparin  5,000 Units Subcutaneous Q8H  . insulin aspart  0-5 Units Subcutaneous QHS  . insulin aspart  0-9 Units Subcutaneous TID WC  . insulin aspart  3 Units Subcutaneous TID WC  . insulin glargine  15 Units Subcutaneous QHS  . isosorbide mononitrate  30 mg Oral BID  . losartan  50 mg Oral Daily  . metoprolol tartrate  75 mg Oral BID  . sodium chloride flush  3 mL Intravenous Q12H   Continuous Infusions: . milrinone 0.25 mcg/kg/min (05/16/16 0700)   PRN Meds: ondansetron (ZOFRAN) IV   Vital Signs    Vitals:   05/16/16 0500 05/16/16 0545 05/16/16 0600 05/16/16 0700  BP: (!) 150/82  (!) 156/82 (!) 151/80  Pulse: 64 63 62 60  Resp: (!) 23 13 20 16   Temp:  98 F (36.7 C)      TempSrc:  Oral    SpO2: 93% 95% 95% 93%  Weight: 165 lb 2 oz (74.9 kg)     Height:        Intake/Output Summary (Last 24 hours) at 05/16/16 0756 Last data filed at 05/16/16 0700  Gross per 24 hour  Intake            685.5 ml  Output              500 ml  Net            185.5 ml   Filed Weights   05/14/16 0405 05/15/16 0500 05/16/16 0500  Weight: 164 lb (74.4 kg) 166 lb 4.8 oz (75.4 kg) 165 lb 2 oz (74.9 kg)    Physical Exam    GEN: Well nourished, well developed, in no acute distress.  HEENT: Grossly normal.  Neck: Supple, JVD elevated, no carotid bruits, or masses. Cardiac: RRR, no murmurs, rubs, or gallops. No clubbing or cyanosis. 1+ pitting edema along the bilateral LE to the mid shins.  Radials/DP/PT 2+ and equal bilaterally.  Respiratory:  Crackles along the bilateral bases. GI: Soft, nontender, nondistended, BS + x 4. MS: no deformity or atrophy. Skin: warm and dry, no rash. Neuro:  Strength and sensation are intact. Psych: AAOx3.  Normal affect.  Labs  CBC  Recent Labs  05/13/16 0855 05/14/16 0357  WBC 7.0 5.4  HGB 12.2 10.3*  HCT 38.0 31.2*  MCV 82.3 81.7  PLT 316 144   Basic Metabolic Panel  Recent Labs  05/15/16 0420 05/16/16 0451  NA 134* 134*  K 3.8 3.7  CL 95* 98*  CO2 31 28  GLUCOSE 190* 259*  BUN 63* 65*  CREATININE 1.91* 1.75*  CALCIUM 8.4* 8.1*   Liver Function Tests No results for input(s): AST, ALT, ALKPHOS, BILITOT, PROT, ALBUMIN in the last 72 hours. No results for input(s): LIPASE, AMYLASE in the last 72 hours. Cardiac Enzymes  Recent Labs  05/13/16 1137 05/13/16 1538 05/13/16 1948  TROPONINI 0.46* 0.56* 0.68*   BNP Invalid input(s): POCBNP D-Dimer No results for input(s): DDIMER in the last 72 hours. Hemoglobin A1C  Recent Labs  05/15/16 0420  HGBA1C 11.3*   Fasting Lipid Panel No results for input(s): CHOL, HDL, LDLCALC, TRIG, CHOLHDL, LDLDIRECT in the last 72 hours. Thyroid Function Tests No results for  input(s): TSH, T4TOTAL, T3FREE, THYROIDAB in the last 72 hours.  Invalid input(s): FREET3  Telemetry    NSR, 60's bpm - Personally Reviewed  ECG    n/a - Personally Reviewed  Radiology    No results found.  Cardiac Studies   Echo 05/14/16: Study Conclusions  - Left ventricle: The cavity size was mildly dilated. Systolic   function was severely reduced. The estimated ejection fraction   was in the range of 20%. Severe global hypokinesis, likely with   regions of akinesis in the anterior, anteroseptal and apical   regions. Wall motion of basal inferior/posterior/lateral wall   best preserved though still depressed. Regional wall motion   abnormalities cannot be excluded. Doppler parameters are   consistent with abnormal left ventricular relaxation (grade 1   diastolic dysfunction). - Aortic valve: Valve area (Vmax): 1.36 cm^2. - Mitral valve: There was mild regurgitation. - Left atrium: The atrium was moderately dilated. - Right ventricle: Systolic function was moderately reduced. - Pulmonary arteries: PA peak pressure: 53 mm Hg (S). - Pericardium, extracardiac: A trivial pericardial effusion was   identified.  Impressions:  - Left pleural effusion noted.  Patient Profile     65 y.o. female with history of CAD s/p remote PCI in 2009 s/p NSTEMI with PCI/DES to mid LCx in 06/2015, ICM/chronic systolic CHF, PAF (diagnosed 06/2015), DM2, CKD stage III, HTN, HLD, and anemia who presented to [facility] on 12/4 with increase LE swelling found to have cardiorenal syndrome. Transferred to stepdown on 12/6 for milrinone infusion with diuresis.   Assessment & Plan    1. Acute on chronic combined CHF/ICM/cardiorenal syndrome: -Echo this admission showed worsening EF as above (previously 30-35% in early 2017) -Difficulty with diuresis with IV Lasix alone given her worsening renal function, though she remained volume overloaded -Transferred to step down 12/6 with initiation of milrinone  infusion with diuresis with IV Lasix 20 mg bid -She reports significant voiding overnight, though this was in the toilet and not calcualted into her total output -Renal function improving with milrinone infusion -Continue milrinone with IV Lasix today, increase IV Lasix to 40 mg bid -Strict I&O's with daily standing weights -Metoprolol  2. CAD/elevated troponin: -As above -No chest pain -Mild troponin elevation likely in the setting of #1 -Continue DAPT with ASA and Plavix -Continue Imdur and metoprolol   3. Acute on CKD stage III secondary to cardiorenal syndrome: -Continue milrinone as above -Renal on board -Renal function stable  today -Diuresis per Renal  4. PAF: -Never felt palpitations -Not on full-dose anticoagulation given CKD, anemia, need for DAPT, and has been maintaining sinus rhythm at follow ups -Consider cardiac monitoring -Currently in sinus rhythm  5. HLD: -Lipitor  6. HTN: -Stable   Signed, Christell Faith, PA-C [facility] Pager: [phone] 05/16/2016, 7:56 AM

## 2016-05-16 NOTE — Progress Notes (Signed)
[place] at [place] NAME: Theresa Rogers    MRN#:  [id number]  DATE OF BIRTH:  1951-05-11  SUBJECTIVE:  Hospital Day: 1 day Theresa Rogers is a 65 y.o. female presenting with Leg Swelling .   Overnight events: No acute overnight events Interval Events: Still some leg swelling But improving  REVIEW OF SYSTEMS:  CONSTITUTIONAL: No fever, fatigue or weakness.  EYES: No blurred or double vision.  EARS, NOSE, AND THROAT: No tinnitus or ear pain.  RESPIRATORY: No cough, shortness of breath, wheezing or hemoptysis.  CARDIOVASCULAR: No chest pain, orthopnea,Positive edema.  GASTROINTESTINAL: No nausea, vomiting, diarrhea or abdominal pain.  GENITOURINARY: No dysuria, hematuria.  ENDOCRINE: No polyuria, nocturia,  HEMATOLOGY: No anemia, easy bruising or bleeding SKIN: No rash or lesion. MUSCULOSKELETAL: No joint pain or arthritis.   NEUROLOGIC: No tingling, numbness, weakness.  PSYCHIATRY: No anxiety or depression.   DRUG ALLERGIES:   Allergies  Allergen Reactions  . Hydralazine Hcl Swelling    VITALS:  Blood pressure (!) 151/78, pulse 68, temperature 98.3 F (36.8 C), temperature source Axillary, resp. rate 20, height 5\' 4"  (1.626 m), weight 74.9 kg (165 lb 2 oz), SpO2 90 %.  PHYSICAL EXAMINATION:  VITAL SIGNS: Vitals:   05/16/16 0937 05/16/16 1200  BP: (!) 148/62 (!) 151/78  Pulse: 69 68  Resp: 13 20  Temp:  98.3 F (36.8 C)   GENERAL:65 y.o.female currently in no acute distress.  HEAD: Normocephalic, atraumatic.  EYES: Pupils equal, round, reactive to light. Extraocular muscles intact. No scleral icterus.  MOUTH: Moist mucosal membrane. Dentition intact. No abscess noted.  EAR, NOSE, THROAT: Clear without exudates. No external lesions.  NECK: Supple. No thyromegaly. No nodules. No JVD.  PULMONARY: Diminished breath sounds without wheeze rails or rhonci. No use of accessory muscles, Good respiratory effort. good  air entry bilaterally CHEST: Nontender to palpation.  CARDIOVASCULAR: S1 and S2. Regular rate and rhythm. No murmurs, rubs, or gallops. 1+ edema. Pedal pulses 2+ bilaterally.  GASTROINTESTINAL: Soft, nontender, nondistended. No masses. Positive bowel sounds. No hepatosplenomegaly.  MUSCULOSKELETAL: No swelling, clubbing, or edema. Range of motion full in all extremities.  NEUROLOGIC: Cranial nerves II through XII are intact. No gross focal neurological deficits. Sensation intact. Reflexes intact.  SKIN: No ulceration, lesions, rashes, or cyanosis. Skin warm and dry. Turgor intact.  PSYCHIATRIC: Mood, affect within normal limits. The patient is awake, alert and oriented x 3. Insight, judgment intact.      LABORATORY PANEL:   CBC  Recent Labs Lab 05/14/16 0357  WBC 5.4  HGB 10.3*  HCT 31.2*  PLT 286   ------------------------------------------------------------------------------------------------------------------  Chemistries   Recent Labs Lab 05/16/16 0451  NA 134*  K 3.7  CL 98*  CO2 28  GLUCOSE 259*  BUN 65*  CREATININE 1.75*  CALCIUM 8.1*   ------------------------------------------------------------------------------------------------------------------  Cardiac Enzymes  Recent Labs Lab 05/13/16 1948  TROPONINI 0.68*   ------------------------------------------------------------------------------------------------------------------  RADIOLOGY:  No results found.  EKG:   Orders placed or performed during the hospital encounter of 05/13/16  . ED EKG  . ED EKG  . EKG 12-Lead  . EKG 12-Lead    ASSESSMENT AND PLAN:   Theresa Rogers is a 65 y.o. female presenting with Leg Swelling . Admitted 05/13/2016 : Day #: 1 day 1. Acute and chronic systolic congestive heart failure: Appreciate cardiology input And tolerating milrinone infusion   2. Acute kidney injury on chronic kidney disease: Nephrology input appreciated  3. Essential  hypertension 4. Type 2  diabetes insulin requiring continue basal insulin and sliding coverage All the records are reviewed and case discussed with Care Management/Social Workerr. Management plans discussed with the patient, family and they are in agreement.  CODE STATUS: full TOTAL TIME TAKING CARE OF THIS PATIENT: 28 minutes.   POSSIBLE D/C IN 2-3DAYS, DEPENDING ON CLINICAL CONDITION.   Theresa Rogers,  Theresa Rogers.D on 05/16/2016 at 2:14 PM  Between 7am to 6pm - Pager - [phone]  After 6pm: House Pager: - [phone]  [facility]  Office  [phone]  CC: Primary care physician; Pcp Not In System

## 2016-05-17 ENCOUNTER — Ambulatory Visit: Payer: Medicaid Other | Admitting: Cardiovascular Disease

## 2016-05-17 LAB — BASIC METABOLIC PANEL
Anion gap: 6 (ref 5–15)
BUN: 61 mg/dL — AB (ref 6–20)
CO2: 31 mmol/L (ref 22–32)
CREATININE: 1.75 mg/dL — AB (ref 0.44–1.00)
Calcium: 8.1 mg/dL — ABNORMAL LOW (ref 8.9–10.3)
Chloride: 99 mmol/L — ABNORMAL LOW (ref 101–111)
GFR calc Af Amer: 34 mL/min — ABNORMAL LOW (ref >60)
GFR, EST NON AFRICAN AMERICAN: 30 mL/min — AB (ref >60)
GLUCOSE: 136 mg/dL — AB (ref 65–99)
POTASSIUM: 3.8 mmol/L (ref 3.5–5.1)
SODIUM: 136 mmol/L (ref 135–145)

## 2016-05-17 LAB — GLUCOSE, CAPILLARY
GLUCOSE-CAPILLARY: 116 mg/dL — AB (ref 65–99)
GLUCOSE-CAPILLARY: 284 mg/dL — AB (ref 65–99)
GLUCOSE-CAPILLARY: 321 mg/dL — AB (ref 65–99)
Glucose-Capillary: 58 mg/dL — ABNORMAL LOW (ref 65–99)
Glucose-Capillary: 62 mg/dL — ABNORMAL LOW (ref 65–99)
Glucose-Capillary: 101 mg/dL — ABNORMAL HIGH (ref 65–99)

## 2016-05-17 NOTE — Progress Notes (Signed)
[place] at [place] NAME: Theresa Rogers    MRN#:  [id number]  DATE OF BIRTH:  [birth year]  SUBJECTIVE:  Hospital Day: 2 days Theresa Rogers is a 65 y.o. female presenting with Leg Swelling .   Overnight events: No acute overnight events Interval Events: No complaints  REVIEW OF SYSTEMS:  CONSTITUTIONAL: No fever, fatigue or weakness.  EYES: No blurred or double vision.  EARS, NOSE, AND THROAT: No tinnitus or ear pain.  RESPIRATORY: No cough, shortness of breath, wheezing or hemoptysis.  CARDIOVASCULAR: No chest pain, orthopnea,Positive edema.  GASTROINTESTINAL: No nausea, vomiting, diarrhea or abdominal pain.  GENITOURINARY: No dysuria, hematuria.  ENDOCRINE: No polyuria, nocturia,  HEMATOLOGY: No anemia, easy bruising or bleeding SKIN: No rash or lesion. MUSCULOSKELETAL: No joint pain or arthritis.   NEUROLOGIC: No tingling, numbness, weakness.  PSYCHIATRY: No anxiety or depression.   DRUG ALLERGIES:   Allergies  Allergen Reactions  . Hydralazine Hcl Swelling    VITALS:  Blood pressure (!) 142/73, pulse 62, temperature 98.4 F (36.9 C), temperature source Oral, resp. rate 18, height 5\' 4"  (1.626 m), weight 74.1 kg (163 lb 5.8 oz), SpO2 92 %.  PHYSICAL EXAMINATION:  VITAL SIGNS: Vitals:   05/17/16 1000 05/17/16 1200  BP: 137/76 (!) 142/73  Pulse: 66 62  Resp: 13 18  Temp:     GENERAL:65 y.o.female currently in no acute distress.  HEAD: Normocephalic, atraumatic.  EYES: Pupils equal, round, reactive to light. Extraocular muscles intact. No scleral icterus.  MOUTH: Moist mucosal membrane. Dentition intact. No abscess noted.  EAR, NOSE, THROAT: Clear without exudates. No external lesions.  NECK: Supple. No thyromegaly. No nodules. No JVD.  PULMONARY: Diminished breath sounds without wheeze rails or rhonci. No use of accessory muscles, Good respiratory effort. good air entry bilaterally CHEST: Nontender to  palpation.  CARDIOVASCULAR: S1 and S2. Regular rate and rhythm. No murmurs, rubs, or gallops. 1+ edema. Pedal pulses 2+ bilaterally.  GASTROINTESTINAL: Soft, nontender, nondistended. No masses. Positive bowel sounds. No hepatosplenomegaly.  MUSCULOSKELETAL: No swelling, clubbing, or edema. Range of motion full in all extremities.  NEUROLOGIC: Cranial nerves II through XII are intact. No gross focal neurological deficits. Sensation intact. Reflexes intact.  SKIN: No ulceration, lesions, rashes, or cyanosis. Skin warm and dry. Turgor intact.  PSYCHIATRIC: Mood, affect within normal limits. The patient is awake, alert and oriented x 3. Insight, judgment intact.      LABORATORY PANEL:   CBC  Recent Labs Lab 05/14/16 0357  WBC 5.4  HGB 10.3*  HCT 31.2*  PLT 286   ------------------------------------------------------------------------------------------------------------------  Chemistries   Recent Labs Lab 05/17/16 0318  NA 136  K 3.8  CL 99*  CO2 31  GLUCOSE 136*  BUN 61*  CREATININE 1.75*  CALCIUM 8.1*   ------------------------------------------------------------------------------------------------------------------  Cardiac Enzymes  Recent Labs Lab 05/13/16 1948  TROPONINI 0.68*   ------------------------------------------------------------------------------------------------------------------  RADIOLOGY:  No results found.  EKG:   Orders placed or performed during the hospital encounter of 05/13/16  . ED EKG  . ED EKG  . EKG 12-Lead  . EKG 12-Lead    ASSESSMENT AND PLAN:   Theresa Rogers is a 65 y.o. female presenting with Leg Swelling . Admitted 05/13/2016 : Day #: 2 days 1. Acute and chronic systolic congestive heart failure: Appreciate cardiology input And tolerating milrinone infusionPossible catheterization Monday   2. Acute kidney injury on chronic kidney disease: Nephrology input appreciated  3. Essential hypertension 4. Type 2 diabetes  insulin requiring continue basal insulin and sliding coverage All the records are reviewed and case discussed with Care Management/Social Workerr. Management plans discussed with the patient, family and they are in agreement.  CODE STATUS: full TOTAL TIME TAKING CARE OF THIS PATIENT: 28 minutes.   POSSIBLE D/C IN 2-3DAYS, DEPENDING ON CLINICAL CONDITION.   Hower,  [name].D on 05/17/2016 at 12:34 PM  Between 7am to 6pm - Pager - [phone]  After 6pm: House Pager: [phone]  [name]  Office  [phone]  CC: Primary care physician; Pcp Not In System

## 2016-05-17 NOTE — Progress Notes (Signed)
Chaplain rounded the unit to provide a compassionate presence and support to the patient. Patient stated she was feeling better than yesterday. Patient also stated that she just finished an orange and that she felt better because of it. Theresa Rogers [phone]

## 2016-05-17 NOTE — Progress Notes (Signed)
[facility]  ROUNDING NOTE   Subjective:     On milrinone gtt  Furosemide increased to 40mg  IV q12 UOP 1500  Creatinine 1.75   Objective:  Vital signs in last 24 hours:  Temp:  [97.8 F (36.6 C)-98.4 F (36.9 C)] 98.4 F (36.9 C) (12/08 0400) Pulse Rate:  [66-72] 66 (12/08 0800) Resp:  [16-21] 20 (12/08 0800) BP: (105-158)/(46-94) 137/72 (12/08 0800) SpO2:  [90 %-95 %] 92 % (12/08 0800) Weight:  [74.1 kg (163 lb 5.8 oz)] 74.1 kg (163 lb 5.8 oz) (12/08 0500)  Weight change: -0.8 kg (-1 lb 12.2 oz) Filed Weights   05/15/16 0500 05/16/16 0500 05/17/16 0500  Weight: 75.4 kg (166 lb 4.8 oz) 74.9 kg (165 lb 2 oz) 74.1 kg (163 lb 5.8 oz)    Intake/Output: I/O last 3 completed shifts: In: 919.5 [P.O.:720; I.V.:199.5] Out: 1500 [Urine:1500]   Intake/Output this shift:  No intake/output data recorded.  Physical Exam: General: NAD, laying in bed  Head: Normocephalic, atraumatic. Moist oral mucosal membranes  Eyes: Anicteric, PERRL  Neck: Supple, trachea midline  Lungs:  Left basilar crackles  Heart: Regular rate and rhythm  Abdomen:  Soft, nontender,   Extremities:  1+ peripheral edema, left greater than right  Neurologic: Nonfocal, moving all four extremities  Skin: No lesions       Basic Metabolic Panel:  Recent Labs Lab 05/13/16 0855 05/14/16 0357 05/15/16 0420 05/16/16 0451 05/17/16 0318  NA 134* 136 134* 134* 136  K 4.7 3.9 3.8 3.7 3.8  CL 96* 99* 95* 98* 99*  CO2 28 31 31 28 31   GLUCOSE 340* 81 190* 259* 136*  BUN 51* 60* 63* 65* 61*  CREATININE 1.81* 2.05* 1.91* 1.75* 1.75*  CALCIUM 8.6* 8.4* 8.4* 8.1* 8.1*    Liver Function Tests: No results for input(s): AST, ALT, ALKPHOS, BILITOT, PROT, ALBUMIN in the last 168 hours. No results for input(s): LIPASE, AMYLASE in the last 168 hours. No results for input(s): AMMONIA in the last 168 hours.  CBC:  Recent Labs Lab 05/13/16 0855 05/14/16 0357  WBC 7.0 5.4  HGB 12.2 10.3*  HCT  38.0 31.2*  MCV 82.3 81.7  PLT 316 286    Cardiac Enzymes:  Recent Labs Lab 05/13/16 0905 05/13/16 1137 05/13/16 1538 05/13/16 1948  TROPONINI 0.51* 0.46* 0.56* 0.68*    BNP: Invalid input(s): POCBNP  CBG:  Recent Labs Lab 05/16/16 0722 05/16/16 1116 05/16/16 1632 05/16/16 2113 05/17/16 0736  GLUCAP 241* 346* 325* 120* 116*    Microbiology: Results for orders placed or performed during the hospital encounter of 05/13/16  MRSA PCR Screening     Status: None   Collection Time: 05/15/16  7:02 PM  Result Value Ref Range Status   MRSA by PCR NEGATIVE NEGATIVE Final    Comment:        The GeneXpert MRSA Assay (FDA approved for NASAL specimens only), is one component of a comprehensive MRSA colonization surveillance program. It is not intended to diagnose MRSA infection nor to guide or monitor treatment for MRSA infections.     Coagulation Studies: No results for input(s): LABPROT, INR in the last 72 hours.  Urinalysis: No results for input(s): COLORURINE, LABSPEC, PHURINE, GLUCOSEU, HGBUR, BILIRUBINUR, KETONESUR, PROTEINUR, UROBILINOGEN, NITRITE, LEUKOCYTESUR in the last 72 hours.  Invalid input(s): APPERANCEUR    Imaging: No results found.   Medications:   . milrinone 0.25 mcg/kg/min (05/17/16 0010)   . aspirin  81 mg Oral Daily  . atorvastatin  40 mg Oral q1800  . carvedilol  12.5 mg Oral BID WC  . clopidogrel  75 mg Oral Daily  . furosemide  40 mg Intravenous Q12H  . heparin  5,000 Units Subcutaneous Q8H  . insulin aspart  0-5 Units Subcutaneous QHS  . insulin aspart  0-9 Units Subcutaneous TID WC  . insulin aspart  3 Units Subcutaneous TID WC  . insulin glargine  15 Units Subcutaneous QHS  . isosorbide mononitrate  30 mg Oral BID  . losartan  50 mg Oral Daily  . potassium chloride  20 mEq Oral BID  . sodium chloride flush  3 mL Intravenous Q12H     Assessment/ Plan:  Theresa Rogers is a 65 y.o. white female with coronary artery  disease, diabetes mellitus type II, congestive heart failure, atrial fibrillation, anemia, hypertension, hyperlipidemia  1. Acute renal failure on Chronic kidney disease stage III with proteinuria: chronic kidney disease secondary to hypertension and diabetes. Baseline creatinine of 1.6 GFR of 34 on 3/17 Acute renal failure from acute cardio-renal syndrome.  - Continue IV furosemide.  - continue losartan  2. Hypertension and acute on chronic systolic congestive heart failure with EF of 20-25%. Atrial fibrillation Blood pressure at goal. - metoprolol, losartan and furosemide.  - placed on milrinone  - appreciate cards input.   3. Diabetes mellitus type II with chronic kidney disease: insulin dependent. History of not well controlled.  Not well controlled. Hemoglobin A1c 11.4%   LOS: 2 Theresa Rogers 12/8/201710:06 AM

## 2016-05-17 NOTE — Progress Notes (Signed)
Patient: Theresa Rogers / Admit Date: 05/13/2016 / Date of Encounter: 05/17/2016, 10:16 AM   Hospital Problem List     Principal Problem:   Acute on chronic systolic CHF (congestive heart failure) (HCC) Active Problems:   Bilateral lower extremity edema   Dyspnea   Coronary artery disease involving coronary bypass graft of native heart without angina pectoris   Cardiomyopathy, ischemic   Systolic dysfunction   Acute renal failure superimposed on stage 3 chronic kidney disease ([place])   Poorly controlled type 2 diabetes mellitus with complication (HCC)   Cardiorenal syndrome   Heart failure (HCC)     Subjective   Continued cough, abdominal bloating swelling, leg edema Ins and outs measured -1.7 L over the past 24 hours Case discussed with nephrology, creatinine stable  Inpatient Medications    Scheduled Meds: . aspirin  81 mg Oral Daily  . atorvastatin  40 mg Oral q1800  . carvedilol  12.5 mg Oral BID WC  . clopidogrel  75 mg Oral Daily  . furosemide  40 mg Intravenous Q12H  . heparin  5,000 Units Subcutaneous Q8H  . insulin aspart  0-5 Units Subcutaneous QHS  . insulin aspart  0-9 Units Subcutaneous TID WC  . insulin aspart  3 Units Subcutaneous TID WC  . insulin glargine  15 Units Subcutaneous QHS  . isosorbide mononitrate  30 mg Oral BID  . losartan  50 mg Oral Daily  . potassium chloride  20 mEq Oral BID  . sodium chloride flush  3 mL Intravenous Q12H   Continuous Infusions: . milrinone 0.25 mcg/kg/min (05/17/16 0010)   PRN Meds: ondansetron (ZOFRAN) IV   Objective: Telemetry:  Physical Exam: Blood pressure 137/72, pulse 66, temperature 98.4 F (36.9 C), temperature source Oral, resp. rate 20, height 5\' 4"  (1.626 m), weight 163 lb 5.8 oz (74.1 kg), SpO2 92 %. Body mass index is 28.04 kg/m. GEN: Well nourished, well developed, in no acute distress.  HEENT: Grossly normal.  Neck: Supple, no JVD, carotid bruits, or masses. Cardiac: RRR, no murmurs, rubs,  or gallops. No clubbing, cyanosis. 1+ pitting edema LLE.  Radials/DP/PT 2+ and equal bilaterally.  Respiratory:  Dullness  at the bases bilaterally 1/4 way up GI: Soft, nontender, nondistended, BS + x 4. MS: no deformity or atrophy. Skin: warm and dry, no rash. Neuro:  Strength and sensation are intact. Psych: AAOx3.  Normal affect.   Intake/Output Summary (Last 24 hours) at 05/17/16 1016 Last data filed at 05/17/16 0600  Gross per 24 hour  Intake            599.7 ml  Output             1500 ml  Net           -900.3 ml     Labs: CBC No results for input(s): WBC, NEUTROABS, HGB, HCT, MCV, PLT in the last 72 hours. Basic Metabolic Panel  Recent Labs  05/16/16 0451 05/17/16 0318  NA 134* 136  K 3.7 3.8  CL 98* 99*  CO2 28 31  GLUCOSE 259* 136*  BUN 65* 61*  CREATININE 1.75* 1.75*  CALCIUM 8.1* 8.1*   Liver Function Tests No results for input(s): AST, ALT, ALKPHOS, BILITOT, PROT, ALBUMIN in the last 72 hours. No results for input(s): LIPASE, AMYLASE in the last 72 hours. Cardiac Enzymes No results for input(s): CKTOTAL, CKMB, CKMBINDEX, TROPONINI in the last 72 hours. BNP Invalid input(s): POCBNP D-Dimer No results for input(s): DDIMER in  the last 72 hours. Hemoglobin A1C  Recent Labs  05/15/16 0420  HGBA1C 11.3*   Fasting Lipid Panel No results for input(s): CHOL, HDL, LDLCALC, TRIG, CHOLHDL, LDLDIRECT in the last 72 hours. Thyroid Function Tests No results for input(s): TSH, T4TOTAL, T3FREE, THYROIDAB in the last 72 hours.  Invalid input(s): FREET3  Weights: Filed Weights   05/15/16 0500 05/16/16 0500 05/17/16 0500  Weight: 166 lb 4.8 oz (75.4 kg) 165 lb 2 oz (74.9 kg) 163 lb 5.8 oz (74.1 kg)     Radiology/Studies:  Dg Chest 2 View  Result Date: 05/14/2016 CLINICAL DATA:  Congestive heart failure. Dilated cardiomyopathy. Productive cough. EXAM: CHEST  2 VIEW COMPARISON:  05/13/2016 FINDINGS: Improved aeration of both lungs is seen with decreased  bibasilar atelectasis. Cardiomegaly and pulmonary vascular congestion are stable. Small bilateral pleural effusions show no significant change. No pneumothorax visualized. IMPRESSION: Improved aeration of both lungs with decreased bibasilar atelectasis. No significant change in small bilateral pleural effusions. Stable cardiomegaly and pulmonary vascular congestion. Electronically Signed   By: Earle Gell M.D.   On: 05/14/2016 07:12   Dg Chest 2 View  Result Date: 05/13/2016 CLINICAL DATA:  Swelling bilateral lower extremities EXAM: CHEST  2 VIEW COMPARISON:  04/29/2016 FINDINGS: Borderline cardiomegaly. Bilateral small pleural effusion with bilateral basilar atelectasis or infiltrate. Again noted linear atelectasis, scarring or infiltrate in lingula. No convincing pulmonary edema. IMPRESSION: Bilateral small pleural effusion with bilateral basilar atelectasis or infiltrate. Again noted linear atelectasis, scarring or infiltrate in lingula. No convincing pulmonary edema. Electronically Signed   By: Lahoma Crocker M.D.   On: 05/13/2016 10:20   Dg Chest 2 View  Result Date: 04/29/2016 CLINICAL DATA:  Cough and shortness of breath EXAM: CHEST  2 VIEW COMPARISON:  07/03/2015 FINDINGS: Small pleural effusions and streaky basilar opacities. Cardiopericardial enlargement that is similar to prior. No edema or pneumothorax. Negative upper mediastinal contours. Mild leftward deviation of the trachea, also seen previously. IMPRESSION: 1. Small pleural effusions and bibasilar atelectasis. Superimposed infection could be obscured. 2. Chronic cardiomegaly.  No pulmonary edema. 3. Mild leftward deviation of the trachea, also seen previously. Correlate with thyroid exam. Electronically Signed   By: Monte Fantasia M.D.   On: 04/29/2016 10:48     Assessment and Plan  65 y.o. female   --- Acute on chronic systolic CHF  ischemic cardiopathy, cardiorenal syndrome Continue use of milrinone for diuresis and to improve renal  function  consider catheterization with Dr. Fletcher Anon on monday.  Ejection fraction has dropped dramatically on recent echocardiogram now less than 20% Suspect worsening underlying coronary disease  She has pleural effusions, abdominal swelling, leg edema consistent with continued systolic CHF Suspect will need to continue milrinone infusion all weekend Continue carvedilol At a later date could change losartan to entresto  ----Cardiorenal syndrome  milrinone infusion over the weekend  continue IV Lasix  ----Poorly controlled diabetes with complications Long history of poor control diabetes,  A1c 11 Possible underlying renal failure from poorly controlled diabetes. Suspect medication and dietary noncompliance  ----Hyperlipidemia Does not feel that statins work, does not want a statin despite known three-vessel coronary artery disease  Cough----  secondary to resolving upper respiratory infection as well as bilateral pleural effusions from heart failure   Total encounter time more than 35 minutes  Greater than 50% was spent in counseling and coordination of care with the patient  Signed, Esmond Plants, MD, Ph.D. [facility] 05/17/2016, 10:16 AM

## 2016-05-17 NOTE — Progress Notes (Signed)
Inpatient Diabetes Program Recommendations  AACE/ADA: New Consensus Statement on Inpatient Glycemic Control (2015)  Target Ranges:  Prepandial:   less than 140 mg/dL      Peak postprandial:   less than 180 mg/dL (1-2 hours)      Critically ill patients:  140 - 180 mg/dL  Results for AHTZIRI, JEFFRIES (MRN [id number]) as of 05/17/2016 09:07  Ref. Range 05/16/2016 07:22 05/16/2016 11:16 05/16/2016 16:32 05/16/2016 21:13 05/17/2016 07:36  Glucose-Capillary Latest Ref Range: 65 - 99 mg/dL 241 (H) 346 (H) 325 (H) 120 (H) 116 (H)    Review of Glycemic Control  Outpatient Diabetes medications: Lantus 20 units QHS, Humalog 5-12 units TID with meals Current orders for Inpatient glycemic control: Lantus 15 units QHS, Novolog 0-9 units TID with meals, Novolog 0-5 units QHS, Novolog 3 units TID with meals for meal coverage   Inpatient Diabetes Program Recommendations: Insulin - Meal Coverage: Please consider increasing meal coverag to Novolog 7 units TID with meals if patient eats at least 50% of meals.  Thanks, Barnie Alderman, RN, MSN, CDE Diabetes Coordinator Inpatient Diabetes Program [phone] (Team Pager from 8am to 5pm)

## 2016-05-18 ENCOUNTER — Inpatient Hospital Stay: Payer: Medicaid Other

## 2016-05-18 LAB — BASIC METABOLIC PANEL
Anion gap: 6 (ref 5–15)
BUN: 57 mg/dL — AB (ref 6–20)
CALCIUM: 8.5 mg/dL — AB (ref 8.9–10.3)
CO2: 31 mmol/L (ref 22–32)
Chloride: 98 mmol/L — ABNORMAL LOW (ref 101–111)
Creatinine, Ser: 1.81 mg/dL — ABNORMAL HIGH (ref 0.44–1.00)
GFR calc non Af Amer: 28 mL/min — ABNORMAL LOW (ref >60)
GFR, EST AFRICAN AMERICAN: 33 mL/min — AB (ref >60)
Glucose, Bld: 249 mg/dL — ABNORMAL HIGH (ref 65–99)
Potassium: 4.6 mmol/L (ref 3.5–5.1)
SODIUM: 135 mmol/L (ref 135–145)

## 2016-05-18 LAB — HEPATIC FUNCTION PANEL
ALT: 29 U/L (ref 14–54)
AST: 21 U/L (ref 15–41)
Albumin: 2.7 g/dL — ABNORMAL LOW (ref 3.5–5.0)
Alkaline Phosphatase: 278 U/L — ABNORMAL HIGH (ref 38–126)
BILIRUBIN DIRECT: 0.1 mg/dL (ref 0.1–0.5)
BILIRUBIN INDIRECT: 0.5 mg/dL (ref 0.3–0.9)
BILIRUBIN TOTAL: 0.6 mg/dL (ref 0.3–1.2)
Total Protein: 5.9 g/dL — ABNORMAL LOW (ref 6.5–8.1)

## 2016-05-18 LAB — GLUCOSE, CAPILLARY
GLUCOSE-CAPILLARY: 333 mg/dL — AB (ref 65–99)
Glucose-Capillary: 229 mg/dL — ABNORMAL HIGH (ref 65–99)
Glucose-Capillary: 88 mg/dL (ref 65–99)
Glucose-Capillary: 186 mg/dL — ABNORMAL HIGH (ref 65–99)

## 2016-05-18 LAB — LACTIC ACID, PLASMA: LACTIC ACID, VENOUS: 1.2 mmol/L (ref 0.5–1.9)

## 2016-05-18 MED ORDER — CARVEDILOL 6.25 MG PO TABS
6.2500 mg | ORAL_TABLET | Freq: Two times a day (BID) | ORAL | Status: DC
  Administered 2016-05-18 – 2016-05-20 (×5): 6.25 mg via ORAL
  Filled 2016-05-18 (×5): qty 1

## 2016-05-18 NOTE — Progress Notes (Signed)
Patient Name: Theresa Rogers Date of Encounter: 05/18/2016  Primary Cardiologist: Kathlyn Sacramento, MD  Hospital Problem List     Principal Problem:   Acute on chronic systolic CHF (congestive heart failure) (HCC) Active Problems:   Bilateral lower extremity edema   Dyspnea   Coronary artery disease involving coronary bypass graft of native heart without angina pectoris   Cardiomyopathy, ischemic   Systolic dysfunction   Acute renal failure superimposed on stage 3 chronic kidney disease ([place])   Poorly controlled type 2 diabetes mellitus with complication (HCC)   Cardiorenal syndrome   Heart failure ([place])     Subjective   Patient reports feeling well today with the exception of continued leg edema, left greater than right. It improves with leg elevation. She denies shortness of breath, chest pain, palpitations, and lightheadedness.  Inpatient Medications    Scheduled Meds: . aspirin  81 mg Oral Daily  . atorvastatin  40 mg Oral q1800  . carvedilol  12.5 mg Oral BID WC  . clopidogrel  75 mg Oral Daily  . furosemide  40 mg Intravenous Q12H  . heparin  5,000 Units Subcutaneous Q8H  . insulin aspart  0-5 Units Subcutaneous QHS  . insulin aspart  0-9 Units Subcutaneous TID WC  . insulin aspart  3 Units Subcutaneous TID WC  . insulin glargine  15 Units Subcutaneous QHS  . isosorbide mononitrate  30 mg Oral BID  . losartan  50 mg Oral Daily  . potassium chloride  20 mEq Oral BID  . sodium chloride flush  3 mL Intravenous Q12H   Continuous Infusions: . milrinone 0.25 mcg/kg/min (05/17/16 1905)   PRN Meds: ondansetron (ZOFRAN) IV   Vital Signs    Vitals:   05/17/16 2200 05/18/16 0000 05/18/16 0200 05/18/16 0441  BP: 125/63 126/64 92/69   Pulse: 76 73 71   Resp: (!) 24 (!) 9 18   Temp:      TempSrc:      SpO2: 93% 97% 96%   Weight:    164 lb 0.4 oz (74.4 kg)  Height:        Intake/Output Summary (Last 24 hours) at 05/18/16 0949 Last data filed at 05/17/16  2300  Gross per 24 hour  Intake            28.03 ml  Output                0 ml  Net            28.03 ml   Filed Weights   05/16/16 0500 05/17/16 0500 05/18/16 0441  Weight: 165 lb 2 oz (74.9 kg) 163 lb 5.8 oz (74.1 kg) 164 lb 0.4 oz (74.4 kg)    Physical Exam   GEN: Well nourished, well developed, in no acute distress.  HEENT: Grossly normal.  Neck: Supple, no JVD, carotid bruits, or masses. Cardiac: RRR, no murmurs, rubs, or gallops. 3+ left and 2+ right calf.  Radials/DP/PT 2+ and equal bilaterally.  Respiratory:  Respirations regular and unlabored, with faint crackles at the right base. GI: Soft, nontender, nondistended, BS + x 4. MS: no deformity or atrophy. Skin: warm and dry, no rash. Neuro:  Strength and sensation are intact. Psych: AAOx3.  Normal affect.  Labs    CBC No results for input(s): WBC, NEUTROABS, HGB, HCT, MCV, PLT in the last 72 hours. Basic Metabolic Panel  Recent Labs  05/17/16 0318 05/18/16 0524  NA 136 135  K 3.8 4.6  CL  99* 98*  CO2 31 31  GLUCOSE 136* 249*  BUN 61* 57*  CREATININE 1.75* 1.81*  CALCIUM 8.1* 8.5*   Liver Function Tests No results for input(s): AST, ALT, ALKPHOS, BILITOT, PROT, ALBUMIN in the last 72 hours. No results for input(s): LIPASE, AMYLASE in the last 72 hours. Cardiac Enzymes No results for input(s): CKTOTAL, CKMB, CKMBINDEX, TROPONINI in the last 72 hours. BNP Invalid input(s): POCBNP D-Dimer No results for input(s): DDIMER in the last 72 hours. Hemoglobin A1C No results for input(s): HGBA1C in the last 72 hours. Fasting Lipid Panel No results for input(s): CHOL, HDL, LDLCALC, TRIG, CHOLHDL, LDLDIRECT in the last 72 hours. Thyroid Function Tests No results for input(s): TSH, T4TOTAL, T3FREE, THYROIDAB in the last 72 hours.  Invalid input(s): FREET3  Telemetry    Normal sinus rhythm with heart rate from 60-80 bpm. No significant arrhythmias. - Personally Reviewed  ECG    05/13/16: Normal sinus rhythm  with left atrial enlargement and poor R-wave progression. - Personally Reviewed  Radiology    No results found.  Cardiac Studies   Transthoracic echocardiogram (05/14/16): Mild LV dilation with LVEF of approximately 20% with global hypokinesis as well as akinesis of the anterior, anteroseptal, and apical regions. Grade 1 diastolic dysfunction. Mild MR. Moderately dilated left atrium. Moderately reduced RV contraction. Moderate pulmonary hypertension. Trivial pericardial effusion.  Patient Profile     65 year old woman with history of chronic systolic heart failure secondary to ischemic cardiomyopathy, multivessel CAD, chronic kidney disease, hypertension, hyperlipidemia, diabetes mellitus, and paroxysmal atrial fibrillation, admitted with progressive lower extremity edema as well as cough concerning for pneumonia. She is currently on milrinone infusion for suspected cardiorenal syndrome in the setting of volume overload.  Assessment & Plan    Acute on chronic systolic heart failure secondary to ischemic cardiomyopathy: The patient feels well with the exception of her leg edema. She continues to have significant swelling in the lower extremities. JVD is not appreciated today. She has faint crackles at the right lung bases with otherwise clear lungs and normal work of breathing. Echocardiogram was notable for biventricular failure and moderate pulmonary hypertension. Given decline in LV systolic function since January, there is concern for progression of CAD contributing to her declining LVEF. Eyes and nose were not accurately recorded yesterday. Her weight is unchanged since admission.  Continue milrinone at 0.25 mcg/kg/min.  In the setting of decompensated heart failure requiring inotropic support, will decrease carvedilol to 6.25 mg twice a day.  Continue furosemide 40 mg IV every 12.  Given asymmetric lower extremity edema, will obtain bilateral lower extremity venous duplex to exclude DVT,  though I suspect her swelling is most likely due to heart failure.  Anticipate left and right heart catheterization on Monday, if renal function remained stable.  Coronary artery disease: No symptoms of ongoing ischemia.  Continue dual antiplatelet therapy with aspirin and clopidogrel.  Continue isosorbide mononitrate 30 mg twice a day.  Decrease carvedilol to 6.25 mg twice a day, as above.  Plan for left and right heart catheterization on Monday to evaluate for progression of CAD contributing to declining LVEF and decompensated heart failure.  Chronic kidney disease: Patient's creatinine today is stable at 1.8. In reviewing her chart, this is at or minimally above her baseline. Renal insufficiency is likely multifactorial including long-term sequelae of diabetes and hypertension. Low cardiac output may be contributing, though the patient otherwise appears relatively well perfused.  Check hepatic function panel and venous lactate to evaluate for other evidence  of Burak Zerbe organ hypoperfusion.  Continue current diuretic and inotropic support for now.  Uncontrolled diabetes mellitus: Long-term problem likely complicating her CAD, cardiomyopathy, and renal disease.  Continued management per hospitalist service.  Continue patient education regarding medication compliance.  Hyperlipidemia: Patient has declined statins in the past despite strong indications for use, CAD and diabetes mellitus.  Cough: Likely multifactorial. Suspect predominantly driven by decompensated heart failure. Patient denies fevers and purulent sputum production. Leukocytosis has also not been present.  Continue management of heart failure.  No antibiotic therapy indicated at this time.   Signed, Nelva Bush, MD  05/18/2016, 9:49 AM

## 2016-05-18 NOTE — Progress Notes (Signed)
[place] [facility] [place] NAME: Theresa Rogers    MR#:  [id number]  DATE OF BIRTH:  [birth year]  SUBJECTIVE:   Patient is here due to lower extremity edema and shortness of breath and noted to be in congestive heart failure. Remains on milrinone drip and IV Lasix and responding to it. No other acute complaints or events overnight. Renal function is stable.  REVIEW OF SYSTEMS:    Review of Systems  Constitutional: Negative for chills and fever.  HENT: Negative for congestion and tinnitus.   Eyes: Negative for blurred vision and double vision.  Respiratory: Negative for cough, shortness of breath and wheezing.   Cardiovascular: Positive for leg swelling. Negative for chest pain, orthopnea and PND.  Gastrointestinal: Negative for abdominal pain, diarrhea, nausea and vomiting.  Genitourinary: Negative for dysuria and hematuria.  Neurological: Negative for dizziness, sensory change and focal weakness.  All other systems reviewed and are negative.   Nutrition: heart healthy Tolerating Diet: Yes Tolerating PT: Await Eval.    DRUG ALLERGIES:   Allergies  Allergen Reactions  . Hydralazine Hcl Swelling    VITALS:  Blood pressure 126/68, pulse 66, temperature 97.8 F (36.6 C), temperature source Oral, resp. rate 17, height 5\' 4"  (1.626 m), weight 74.4 kg (164 lb 0.4 oz), SpO2 92 %.  PHYSICAL EXAMINATION:   Physical Exam  GENERAL:  65 y.o.-year-old patient sitting up in bed in no acute distress.  EYES: Pupils equal, round, reactive to light and accommodation. No scleral icterus. Extraocular muscles intact.  HEENT: Head atraumatic, normocephalic. Oropharynx and nasopharynx clear.  NECK:  Supple, no jugular venous distention. No thyroid enlargement, no tenderness.  LUNGS: Normal breath sounds bilaterally, no wheezing, rales, rhonchi. No use of accessory muscles of respiration.  CARDIOVASCULAR: S1, S2 normal. No murmurs, rubs, or gallops.   ABDOMEN: Soft, nontender, nondistended. Bowel sounds present. No organomegaly or mass.  EXTREMITIES: No cyanosis, clubbing, +2 edema b/l.    NEUROLOGIC: Cranial nerves II through XII are intact. No focal Motor or sensory deficits b/l.   PSYCHIATRIC: The patient is alert and oriented x 3.  SKIN: No obvious rash, lesion, or ulcer.    LABORATORY PANEL:   CBC  Recent Labs Lab 05/14/16 0357  WBC 5.4  HGB 10.3*  HCT 31.2*  PLT 286   ------------------------------------------------------------------------------------------------------------------  Chemistries   Recent Labs Lab 05/18/16 0524 05/18/16 1104  NA 135  --   K 4.6  --   CL 98*  --   CO2 31  --   GLUCOSE 249*  --   BUN 57*  --   CREATININE 1.81*  --   CALCIUM 8.5*  --   AST  --  21  ALT  --  29  ALKPHOS  --  278*  BILITOT  --  0.6   ------------------------------------------------------------------------------------------------------------------  Cardiac Enzymes  Recent Labs Lab 05/13/16 1948  TROPONINI 0.68*   ------------------------------------------------------------------------------------------------------------------  RADIOLOGY:  US Venous Img Lower Bilateral  Result Date: 05/18/2016 CLINICAL DATA:  Bilateral lower extremity edema. History of pulmonary embolism. Evaluate for DVT. EXAM: BILATERAL LOWER EXTREMITY VENOUS DOPPLER ULTRASOUND TECHNIQUE: Gray-scale sonography with graded compression, as well as color Doppler and duplex ultrasound were performed to evaluate the lower extremity deep venous systems from the level of the common femoral vein and including the common femoral, femoral, profunda femoral, popliteal and calf veins including the posterior tibial, peroneal and gastrocnemius veins when visible. The superficial great saphenous vein was also interrogated. Spectral Doppler  was utilized to evaluate flow at rest and with distal augmentation maneuvers in the common femoral, femoral and  popliteal veins. COMPARISON:  None. FINDINGS: RIGHT LOWER EXTREMITY Common Femoral Vein: No evidence of thrombus. Normal compressibility, respiratory phasicity and response to augmentation. Saphenofemoral Junction: No evidence of thrombus. Normal compressibility and flow on color Doppler imaging. Profunda Femoral Vein: No evidence of thrombus. Normal compressibility and flow on color Doppler imaging. Femoral Vein: No evidence of thrombus. Normal compressibility, respiratory phasicity and response to augmentation. Popliteal Vein: No evidence of thrombus. Normal compressibility, respiratory phasicity and response to augmentation. Calf Veins: No evidence of thrombus. Normal compressibility and flow on color Doppler imaging. Superficial Great Saphenous Vein: No evidence of thrombus. Normal compressibility and flow on color Doppler imaging. Venous Reflux:  None. Other Findings:  None. LEFT LOWER EXTREMITY Common Femoral Vein: No evidence of thrombus. Normal compressibility, respiratory phasicity and response to augmentation. Saphenofemoral Junction: No evidence of thrombus. Normal compressibility and flow on color Doppler imaging. Profunda Femoral Vein: No evidence of thrombus. Normal compressibility and flow on color Doppler imaging. Femoral Vein: No evidence of thrombus. Normal compressibility, respiratory phasicity and response to augmentation. Popliteal Vein: No evidence of thrombus. Normal compressibility, respiratory phasicity and response to augmentation. Calf Veins: No evidence of thrombus. Normal compressibility and flow on color Doppler imaging. Superficial Great Saphenous Vein: No evidence of thrombus. Normal compressibility and flow on color Doppler imaging. Venous Reflux:  None. Other Findings:  None. IMPRESSION: No evidence of DVT within either lower extremity. Electronically Signed   By: Sandi Mariscal M.D.   On: 05/18/2016 13:31     ASSESSMENT AND PLAN:   65 year old female with past medical history of  ischemic cardiomyopathy, paroxysmal 8 fibrillation, hypertension, hyperlipidemia, diabetes, chronic kidney disease stage III, chronic systolic CHF who presented to the hospital due to shortness of breath and worsening lower extremity edema.  1. Acute on chronic systolic CHF-continue IV Lasix, milrinone. -Responding well to it.  Plan to cont. Lasix, Milrinone through the weekend and await further cards input.  - cont. Coreg. Losartan.   2. ARF - cardiorenal in nature due to Cardiomyopathy.  - stable on Milrinone, Lasix and will cont. To monitor.  - Nephro following.   3. Ischemic CM - EF has reduced to 20% from 30-35%.  - ?? Progression of CAD.  Plan for possible cath on Monday but will defer to cards.  - cont. Supportive care.  No acute chest pain. Cont. ASA, Plavix, B-blocker, Statin.   4. DM - cont. Lantus, Novolog with meals and SSI and follow BS.    5. Hyperlipidemia - cont. Atorvastatin.   All the records are reviewed and case discussed with Care Management/Social Worker. Management plans discussed with the patient, family and they are in agreement.  CODE STATUS: Full code  DVT Prophylaxis: Hep. SQ  TOTAL TIME TAKING CARE OF THIS PATIENT: 30 minutes.   POSSIBLE D/C IN 2-3 DAYS, DEPENDING ON CLINICAL CONDITION.   Henreitta Leber M.D on 05/18/2016 at 2:15 PM  Between 7am to 6pm - Pager - [phone]  After 6pm go to [email or web address] - Proofreader  [facility]  Office  [phone]  CC: Primary care physician; Pcp Not In System

## 2016-05-18 NOTE — Progress Notes (Signed)
[facility]  ROUNDING NOTE   Subjective:   On milrinone gtt  UOP not recorded Creatinine stable.   Continues to have cough  Objective:  Vital signs in last 24 hours:  Temp:  [98.7 F (37.1 C)] 98.7 F (37.1 C) (12/08 1946) Pulse Rate:  [62-78] 71 (12/09 0200) Resp:  [9-24] 18 (12/09 0200) BP: (92-142)/(63-76) 92/69 (12/09 0200) SpO2:  [92 %-97 %] 96 % (12/09 0200) Weight:  [74.4 kg (164 lb 0.4 oz)] 74.4 kg (164 lb 0.4 oz) (12/09 0441)  Weight change: 0.3 kg (10.6 oz) Filed Weights   05/16/16 0500 05/17/16 0500 05/18/16 0441  Weight: 74.9 kg (165 lb 2 oz) 74.1 kg (163 lb 5.8 oz) 74.4 kg (164 lb 0.4 oz)    Intake/Output: I/O last 3 completed shifts: In: 342.1 [P.O.:240; I.V.:102.1] Out: 600 [Urine:600]   Intake/Output this shift:  No intake/output data recorded.  Physical Exam: General: NAD, laying in bed  Head: Normocephalic, atraumatic. Moist oral mucosal membranes  Eyes: Anicteric, PERRL  Neck: Supple, trachea midline  Lungs:  Left basilar crackles  Heart: Regular rate and rhythm  Abdomen:  Soft, nontender,   Extremities:  1+ peripheral edema, left greater than right  Neurologic: Nonfocal, moving all four extremities  Skin: No lesions       Basic Metabolic Panel:  Recent Labs Lab 05/14/16 0357 05/15/16 0420 05/16/16 0451 05/17/16 0318 05/18/16 0524  NA 136 134* 134* 136 135  K 3.9 3.8 3.7 3.8 4.6  CL 99* 95* 98* 99* 98*  CO2 31 31 28 31 31   GLUCOSE 81 190* 259* 136* 249*  BUN 60* 63* 65* 61* 57*  CREATININE 2.05* 1.91* 1.75* 1.75* 1.81*  CALCIUM 8.4* 8.4* 8.1* 8.1* 8.5*    Liver Function Tests: No results for input(s): AST, ALT, ALKPHOS, BILITOT, PROT, ALBUMIN in the last 168 hours. No results for input(s): LIPASE, AMYLASE in the last 168 hours. No results for input(s): AMMONIA in the last 168 hours.  CBC:  Recent Labs Lab 05/13/16 0855 05/14/16 0357  WBC 7.0 5.4  HGB 12.2 10.3*  HCT 38.0 31.2*  MCV 82.3 81.7  PLT 316  286    Cardiac Enzymes:  Recent Labs Lab 05/13/16 0905 05/13/16 1137 05/13/16 1538 05/13/16 1948  TROPONINI 0.51* 0.46* 0.56* 0.68*    BNP: Invalid input(s): POCBNP  CBG:  Recent Labs Lab 05/17/16 1203 05/17/16 1238 05/17/16 1609 05/17/16 2120 05/18/16 0730  GLUCAP 64* 101* 284* 321* 229*    Microbiology: Results for orders placed or performed during the hospital encounter of 05/13/16  MRSA PCR Screening     Status: None   Collection Time: 05/15/16  7:02 PM  Result Value Ref Range Status   MRSA by PCR NEGATIVE NEGATIVE Final    Comment:        The GeneXpert MRSA Assay (FDA approved for NASAL specimens only), is one component of a comprehensive MRSA colonization surveillance program. It is not intended to diagnose MRSA infection nor to guide or monitor treatment for MRSA infections.     Coagulation Studies: No results for input(s): LABPROT, INR in the last 72 hours.  Urinalysis: No results for input(s): COLORURINE, LABSPEC, PHURINE, GLUCOSEU, HGBUR, BILIRUBINUR, KETONESUR, PROTEINUR, UROBILINOGEN, NITRITE, LEUKOCYTESUR in the last 72 hours.  Invalid input(s): APPERANCEUR    Imaging: No results found.   Medications:   . milrinone 0.25 mcg/kg/min (05/17/16 1905)   . aspirin  81 mg Oral Daily  . atorvastatin  40 mg Oral q1800  . carvedilol  6.25 mg Oral BID WC  . clopidogrel  75 mg Oral Daily  . furosemide  40 mg Intravenous Q12H  . heparin  5,000 Units Subcutaneous Q8H  . insulin aspart  0-5 Units Subcutaneous QHS  . insulin aspart  0-9 Units Subcutaneous TID WC  . insulin aspart  3 Units Subcutaneous TID WC  . insulin glargine  15 Units Subcutaneous QHS  . isosorbide mononitrate  30 mg Oral BID  . losartan  50 mg Oral Daily  . potassium chloride  20 mEq Oral BID  . sodium chloride flush  3 mL Intravenous Q12H     Assessment/ Plan:  [name] is a 65 y.o. white female with coronary artery disease, diabetes mellitus type II,  congestive heart failure, atrial fibrillation, anemia, hypertension, hyperlipidemia  1. Acute renal failure on Chronic kidney disease stage III with proteinuria: chronic kidney disease secondary to hypertension and diabetes. Baseline creatinine of 1.6 GFR of 34 on 3/17 Acute renal failure from acute cardio-renal syndrome.  - Continue IV furosemide.  - continue losartan  2. Hypertension and acute on chronic systolic congestive heart failure with EF of 20-25%. Atrial fibrillation Blood pressure at goal. - metoprolol, losartan and furosemide.  - placed on milrinone  - appreciate cards input. Catheterization Monday?   3. Diabetes mellitus type II with chronic kidney disease: insulin dependent. History of not well controlled.  Not well controlled. Hemoglobin A1c 11.4%   LOS: [place], Theresa Rogers 12/9/201710:12 AM

## 2016-05-19 LAB — CBC
HCT: 29.5 % — ABNORMAL LOW (ref 35.0–47.0)
Hemoglobin: 9.7 g/dL — ABNORMAL LOW (ref 12.0–16.0)
MCH: 27.1 pg (ref 26.0–34.0)
MCHC: 33 g/dL (ref 32.0–36.0)
MCV: 82.3 fL (ref 80.0–100.0)
Platelets: 293 10*3/uL (ref 150–440)
RBC: 3.59 MIL/uL — ABNORMAL LOW (ref 3.80–5.20)
RDW: 15.2 % — ABNORMAL HIGH (ref 11.5–14.5)
WBC: 6.8 10*3/uL (ref 3.6–11.0)

## 2016-05-19 LAB — BASIC METABOLIC PANEL
Anion gap: 5 (ref 5–15)
BUN: 57 mg/dL — ABNORMAL HIGH (ref 6–20)
CHLORIDE: 96 mmol/L — AB (ref 101–111)
CO2: 31 mmol/L (ref 22–32)
Calcium: 8.3 mg/dL — ABNORMAL LOW (ref 8.9–10.3)
Creatinine, Ser: 1.76 mg/dL — ABNORMAL HIGH (ref 0.44–1.00)
GFR calc non Af Amer: 29 mL/min — ABNORMAL LOW (ref >60)
GFR, EST AFRICAN AMERICAN: 34 mL/min — AB (ref >60)
Glucose, Bld: 309 mg/dL — ABNORMAL HIGH (ref 65–99)
POTASSIUM: 4.4 mmol/L (ref 3.5–5.1)
SODIUM: 132 mmol/L — AB (ref 135–145)

## 2016-05-19 LAB — GLUCOSE, CAPILLARY
GLUCOSE-CAPILLARY: 250 mg/dL — AB (ref 65–99)
GLUCOSE-CAPILLARY: 278 mg/dL — AB (ref 65–99)
Glucose-Capillary: 275 mg/dL — ABNORMAL HIGH (ref 65–99)
Glucose-Capillary: 189 mg/dL — ABNORMAL HIGH (ref 65–99)

## 2016-05-19 MED ORDER — SODIUM CHLORIDE 0.9% FLUSH
3.0000 mL | Freq: Two times a day (BID) | INTRAVENOUS | Status: DC
  Administered 2016-05-19 – 2016-05-20 (×3): 3 mL via INTRAVENOUS

## 2016-05-19 MED ORDER — INSULIN GLARGINE 100 UNIT/ML ~~LOC~~ SOLN
10.0000 [IU] | Freq: Every day | SUBCUTANEOUS | Status: DC
  Administered 2016-05-19: 10 [IU] via SUBCUTANEOUS
  Filled 2016-05-19 (×2): qty 0.1

## 2016-05-19 MED ORDER — SODIUM CHLORIDE 0.9 % IV SOLN: 250.0000 mL | INTRAVENOUS | Status: DC | PRN

## 2016-05-19 MED ORDER — ASPIRIN 81 MG PO CHEW
81.0000 mg | CHEWABLE_TABLET | ORAL | Status: AC
  Administered 2016-05-20: 81 mg via ORAL

## 2016-05-19 MED ORDER — SODIUM CHLORIDE 0.9 % IV SOLN
INTRAVENOUS | Status: DC
  Administered 2016-05-20: 01:00:00 via INTRAVENOUS

## 2016-05-19 MED ORDER — SODIUM CHLORIDE 0.9% FLUSH: 3.0000 mL | INTRAVENOUS | Status: DC | PRN

## 2016-05-19 NOTE — Progress Notes (Signed)
Patient Name: Theresa Rogers Date of Encounter: 05/19/2016  Primary Cardiologist: Kathlyn [place], MD  Hospital Problem List     Principal Problem:   Acute on chronic systolic CHF (congestive heart failure) (HCC) Active Problems:   Bilateral lower extremity edema   Dyspnea   Coronary artery disease involving coronary bypass graft of native heart without angina pectoris   Cardiomyopathy, ischemic   Systolic dysfunction   Acute renal failure superimposed on stage 3 chronic kidney disease ([place])   Poorly controlled type 2 diabetes mellitus with complication (HCC)   Cardiorenal syndrome   Heart failure ([place])     Subjective   Patient notes lower extremity edema has improved since yesterday. Swelling on the left side is typically worse, as she always sleeps with her left side down. Mild cough when lying flat in bed. No shortness of breath or chest pain.  Inpatient Medications    Scheduled Meds: . aspirin  81 mg Oral Daily  . [START ON 05/20/2016] aspirin  81 mg Oral Pre-Cath  . atorvastatin  40 mg Oral q1800  . carvedilol  6.25 mg Oral BID WC  . clopidogrel  75 mg Oral Daily  . heparin  5,000 Units Subcutaneous Q8H  . insulin aspart  0-5 Units Subcutaneous QHS  . insulin aspart  0-9 Units Subcutaneous TID WC  . insulin aspart  3 Units Subcutaneous TID WC  . insulin glargine  15 Units Subcutaneous QHS  . isosorbide mononitrate  30 mg Oral BID  . losartan  50 mg Oral Daily  . potassium chloride  20 mEq Oral BID  . sodium chloride flush  3 mL Intravenous Q12H  . sodium chloride flush  3 mL Intravenous Q12H   Continuous Infusions: . [START ON 05/20/2016] sodium chloride    . milrinone 0.25 mcg/kg/min (05/17/16 1905)   PRN Meds: sodium chloride, ondansetron (ZOFRAN) IV, sodium chloride flush   Vital Signs    Vitals:   05/19/16 0425 05/19/16 0500 05/19/16 0600 05/19/16 0835  BP:  130/72 137/77 131/82  Pulse:  74 72 73  Resp:  20 17   Temp:      TempSrc:      SpO2:   90% 94%   Weight: 159 lb 9.8 oz (72.4 kg)     Height:        Intake/Output Summary (Last 24 hours) at 05/19/16 0940 Last data filed at 05/19/16 0600  Gross per 24 hour  Intake            656.7 ml  Output                0 ml  Net            656.7 ml   Filed Weights   05/17/16 0500 05/18/16 0441 05/19/16 0425  Weight: 163 lb 5.8 oz (74.1 kg) 164 lb 0.4 oz (74.4 kg) 159 lb 9.8 oz (72.4 kg)    Physical Exam   GEN: Well nourished, well developed, in no acute distress. She is seated comfortably on the bedside. HEENT: Grossly normal.  Neck: Supple. JVP approximately 8-10 cm with positive HJR. Cardiac: RRR, no murmurs, rubs, or gallops. 3+ left and trace right calf edema.  Radials/DP/PT 2+ and equal bilaterally.  Respiratory:  Respirations regular and unlabored, with mildly diminished breath sounds at both bases. GI: Soft, nontender, nondistended, BS + x 4. MS: no deformity or atrophy. Skin: warm and dry, no rash. Neuro:  Strength and sensation are intact. Psych: AAOx3.  Normal  affect.  Labs    CBC  Recent Labs  05/19/16 0434  WBC 6.8  HGB 9.7*  HCT 29.5*  MCV 82.3  PLT 240   Basic Metabolic Panel  Recent Labs  05/18/16 0524 05/19/16 0434  NA 135 132*  K 4.6 4.4  CL 98* 96*  CO2 31 31  GLUCOSE 249* 309*  BUN 57* 57*  CREATININE 1.81* 1.76*  CALCIUM 8.5* 8.3*   Liver Function Tests  Recent Labs  05/18/16 1104  AST 21  ALT 29  ALKPHOS 278*  BILITOT 0.6  PROT 5.9*  ALBUMIN 2.7*    Telemetry    Normal sinus rhythm with heart rate from 60-80 bpm. No significant arrhythmias. - Personally Reviewed  ECG    05/13/16: Normal sinus rhythm with left atrial enlargement and poor R-wave progression. - Personally Reviewed  Radiology    US Venous Img Lower Bilateral  Result Date: 05/18/2016 CLINICAL DATA:  Bilateral lower extremity edema. History of pulmonary embolism. Evaluate for DVT. EXAM: BILATERAL LOWER EXTREMITY VENOUS DOPPLER ULTRASOUND TECHNIQUE:  Gray-scale sonography with graded compression, as well as color Doppler and duplex ultrasound were performed to evaluate the lower extremity deep venous systems from the level of the common femoral vein and including the common femoral, femoral, profunda femoral, popliteal and calf veins including the posterior tibial, peroneal and gastrocnemius veins when visible. The superficial great saphenous vein was also interrogated. Spectral Doppler was utilized to evaluate flow at rest and with distal augmentation maneuvers in the common femoral, femoral and popliteal veins. COMPARISON:  None. FINDINGS: RIGHT LOWER EXTREMITY Common Femoral Vein: No evidence of thrombus. Normal compressibility, respiratory phasicity and response to augmentation. Saphenofemoral Junction: No evidence of thrombus. Normal compressibility and flow on color Doppler imaging. Profunda Femoral Vein: No evidence of thrombus. Normal compressibility and flow on color Doppler imaging. Femoral Vein: No evidence of thrombus. Normal compressibility, respiratory phasicity and response to augmentation. Popliteal Vein: No evidence of thrombus. Normal compressibility, respiratory phasicity and response to augmentation. Calf Veins: No evidence of thrombus. Normal compressibility and flow on color Doppler imaging. Superficial Great Saphenous Vein: No evidence of thrombus. Normal compressibility and flow on color Doppler imaging. Venous Reflux:  None. Other Findings:  None. LEFT LOWER EXTREMITY Common Femoral Vein: No evidence of thrombus. Normal compressibility, respiratory phasicity and response to augmentation. Saphenofemoral Junction: No evidence of thrombus. Normal compressibility and flow on color Doppler imaging. Profunda Femoral Vein: No evidence of thrombus. Normal compressibility and flow on color Doppler imaging. Femoral Vein: No evidence of thrombus. Normal compressibility, respiratory phasicity and response to augmentation. Popliteal Vein: No evidence  of thrombus. Normal compressibility, respiratory phasicity and response to augmentation. Calf Veins: No evidence of thrombus. Normal compressibility and flow on color Doppler imaging. Superficial Great Saphenous Vein: No evidence of thrombus. Normal compressibility and flow on color Doppler imaging. Venous Reflux:  None. Other Findings:  None. IMPRESSION: No evidence of DVT within either lower extremity. Electronically Signed   By: Sandi Mariscal M.D.   On: 05/18/2016 13:31    Cardiac Studies   Transthoracic echocardiogram (05/14/16): Mild LV dilation with LVEF of approximately 20% with global hypokinesis as well as akinesis of the anterior, anteroseptal, and apical regions. Grade 1 diastolic dysfunction. Mild MR. Moderately dilated left atrium. Moderately reduced RV contraction. Moderate pulmonary hypertension. Trivial pericardial effusion.  Patient Profile     65 year old woman with history of chronic systolic heart failure secondary to ischemic cardiomyopathy, multivessel CAD, chronic kidney disease, hypertension, hyperlipidemia, diabetes mellitus, and  paroxysmal atrial fibrillation, admitted with progressive lower extremity edema as well as cough concerning for pneumonia. She is currently on milrinone infusion for suspected cardiorenal syndrome in the setting of volume overload.  Assessment & Plan    Acute on chronic systolic heart failure secondary to ischemic cardiomyopathy: The patient feels well with the exception of her leg edema and occasional cough when lying flat. Leg swelling is slightly improved, though still significant in the left calf. Lower extremity venous duplex without evidence of DVT. Echocardiogram was notable for biventricular failure and moderate pulmonary hypertension. Given decline in LV systolic function since January, there is concern for progression of CAD contributing to her declining LVEF. Intake and output not well recorded. Weight is down 2 kg from yesterday suggesting  good diuresis or variation in scales.  Continue milrinone at 0.25 mcg/kg/min pending catheterization tomorrow. Hope to wean rapidly afterwards.  Continue carvedilol 6.25 mg twice a day.  Furosemide 40 mg IV 1 given this morning. We will hold this evening's dose and hydrate gently overnight in anticipation of left and right heart catheterization tomorrow in the setting of chronic kidney disease.  Plan for left and right heart catheterization with possible PCI with Dr. Fletcher Anon tomorrow. Given history of right radial artery perforation during most recently attempted catheterization, procedural likely need to be done from a femoral approach. I have reviewed the risks, indications, and alternatives to cardiac catheterization, possible angioplasty, and stenting with the patient. Risks include but are not limited to bleeding, infection, vascular injury, stroke, myocardial infection, arrhythmia, kidney injury, radiation-related injury in the case of prolonged fluoroscopy use, emergency cardiac surgery, and death. The patient understands the risks of serious complication is 1-2 in 4782 with diagnostic cardiac cath and 1-2% or less with angioplasty/stenting. If intervention is needed, I warned the patient that this may need to be stage due to her renal insufficiency.  Coronary artery disease: No symptoms of ongoing ischemia.  Continue dual antiplatelet therapy with aspirin and clopidogrel.  Continue isosorbide mononitrate 30 mg twice a day and carvedilol 6.25 mg twice a day  Plan for left and right heart catheterization tomorrow.  Chronic kidney disease: Patient's creatinine today is stable at 1.8, which is at or near her baseline. LFTs yesterday show mildly elevated alkaline phosphatase, which is nonspecific, with normal bilirubin and transaminases. Venous lactate also normal. These findings argue against significant hepatic congestion and low cardiac output.  Continue inotropic support overnight to  optimize renal function in anticipation of catheterization. Would favor rapid weaning of her known following cath, based on hemodynamics.  Hold evening furosemide and hydrate gently overnight in anticipation of catheterization.  Uncontrolled diabetes mellitus: Long-term problem likely complicating her CAD, cardiomyopathy, and renal disease.  Will decrease tonight's dose of insulin glargine to 10 units in anticipation of being nothing by mouth for catheterization tomorrow.  Continued management per hospitalist service.  Continue patient education regarding medication compliance.  Hyperlipidemia: Patient has declined statins in the past despite strong indications for use, CAD and diabetes mellitus.  Cough: This is stable. Suspect this is predominantly driven by decompensated heart failure. Patient denies fevers and purulent sputum production. Leukocytosis has also not been present.  Continue management of heart failure.  No antibiotic therapy indicated at this time.  Anemia: Chronic and near baseline. No active bleeding. Suspect this is likely due to chronic disease, including CKD.  Continue dual antiplatelet therapy given prior stent less than 12 months ago.  Further workup and management per hospitalist service and nephrology.  Signed, Nelva Bush, MD  05/19/2016, 9:40 AM

## 2016-05-19 NOTE — Progress Notes (Signed)
[facility] [place] NAME: Theresa Rogers    MR#:  [id number]  DATE OF BIRTH:  1951/05/02  SUBJECTIVE:   Patient is here due to lower extremity edema and shortness of breath and noted to be in congestive heart failure. Remains on milrinone drip and IV Lasix.  LE edema much improved.  Doppler of LE (-) for DVT.  No other acute complaints or events overnight. Renal function is stable.  REVIEW OF SYSTEMS:    Review of Systems  Constitutional: Negative for chills and fever.  HENT: Negative for congestion and tinnitus.   Eyes: Negative for blurred vision and double vision.  Respiratory: Negative for cough, shortness of breath and wheezing.   Cardiovascular: Positive for leg swelling. Negative for chest pain, orthopnea and PND.  Gastrointestinal: Negative for abdominal pain, diarrhea, nausea and vomiting.  Genitourinary: Negative for dysuria and hematuria.  Neurological: Negative for dizziness, sensory change and focal weakness.  All other systems reviewed and are negative.   Nutrition: heart healthy Tolerating Diet: Yes Tolerating PT: ambulatory  DRUG ALLERGIES:   Allergies  Allergen Reactions  . Hydralazine Hcl Swelling    VITALS:  Blood pressure 137/76, pulse 70, temperature 98 F (36.7 C), temperature source Oral, resp. rate 16, height 5\' 4"  (1.626 m), weight 72.4 kg (159 lb 9.8 oz), SpO2 94 %.  PHYSICAL EXAMINATION:   Physical Exam  GENERAL:  65 y.o.-year-old patient sitting up in bed in no acute distress.  EYES: Pupils equal, round, reactive to light and accommodation. No scleral icterus. Extraocular muscles intact.  HEENT: Head atraumatic, normocephalic. Oropharynx and nasopharynx clear.  NECK:  Supple, no jugular venous distention. No thyroid enlargement, no tenderness.  LUNGS: Normal breath sounds bilaterally, no wheezing, rales, rhonchi. No use of accessory muscles of respiration.  CARDIOVASCULAR: S1, S2 normal. No  murmurs, rubs, or gallops.  ABDOMEN: Soft, nontender, nondistended. Bowel sounds present. No organomegaly or mass.  EXTREMITIES: No cyanosis, clubbing, +2 edema L>R.   NEUROLOGIC: Cranial nerves II through XII are intact. No focal Motor or sensory deficits b/l.   PSYCHIATRIC: The patient is alert and oriented x 3.  SKIN: No obvious rash, lesion, or ulcer.    LABORATORY PANEL:   CBC  Recent Labs Lab 05/19/16 0434  WBC 6.8  HGB 9.7*  HCT 29.5*  PLT 293   ------------------------------------------------------------------------------------------------------------------  Chemistries   Recent Labs Lab 05/18/16 1104 05/19/16 0434  NA  --  132*  K  --  4.4  CL  --  96*  CO2  --  31  GLUCOSE  --  309*  BUN  --  57*  CREATININE  --  1.76*  CALCIUM  --  8.3*  AST 21  --   ALT 29  --   ALKPHOS 278*  --   BILITOT 0.6  --    ------------------------------------------------------------------------------------------------------------------  Cardiac Enzymes  Recent Labs Lab 05/13/16 1948  TROPONINI 0.68*   ------------------------------------------------------------------------------------------------------------------  RADIOLOGY:  US Venous Img Lower Bilateral  Result Date: 05/18/2016 CLINICAL DATA:  Bilateral lower extremity edema. History of pulmonary embolism. Evaluate for DVT. EXAM: BILATERAL LOWER EXTREMITY VENOUS DOPPLER ULTRASOUND TECHNIQUE: Gray-scale sonography with graded compression, as well as color Doppler and duplex ultrasound were performed to evaluate the lower extremity deep venous systems from the level of the common femoral vein and including the common femoral, femoral, profunda femoral, popliteal and calf veins including the posterior tibial, peroneal and gastrocnemius veins when visible. The superficial great saphenous vein  was also interrogated. Spectral Doppler was utilized to evaluate flow at rest and with distal augmentation maneuvers in the common  femoral, femoral and popliteal veins. COMPARISON:  None. FINDINGS: RIGHT LOWER EXTREMITY Common Femoral Vein: No evidence of thrombus. Normal compressibility, respiratory phasicity and response to augmentation. Saphenofemoral Junction: No evidence of thrombus. Normal compressibility and flow on color Doppler imaging. Profunda Femoral Vein: No evidence of thrombus. Normal compressibility and flow on color Doppler imaging. Femoral Vein: No evidence of thrombus. Normal compressibility, respiratory phasicity and response to augmentation. Popliteal Vein: No evidence of thrombus. Normal compressibility, respiratory phasicity and response to augmentation. Calf Veins: No evidence of thrombus. Normal compressibility and flow on color Doppler imaging. Superficial Great Saphenous Vein: No evidence of thrombus. Normal compressibility and flow on color Doppler imaging. Venous Reflux:  None. Other Findings:  None. LEFT LOWER EXTREMITY Common Femoral Vein: No evidence of thrombus. Normal compressibility, respiratory phasicity and response to augmentation. Saphenofemoral Junction: No evidence of thrombus. Normal compressibility and flow on color Doppler imaging. Profunda Femoral Vein: No evidence of thrombus. Normal compressibility and flow on color Doppler imaging. Femoral Vein: No evidence of thrombus. Normal compressibility, respiratory phasicity and response to augmentation. Popliteal Vein: No evidence of thrombus. Normal compressibility, respiratory phasicity and response to augmentation. Calf Veins: No evidence of thrombus. Normal compressibility and flow on color Doppler imaging. Superficial Great Saphenous Vein: No evidence of thrombus. Normal compressibility and flow on color Doppler imaging. Venous Reflux:  None. Other Findings:  None. IMPRESSION: No evidence of DVT within either lower extremity. Electronically Signed   By: Sandi Mariscal M.D.   On: 05/18/2016 13:31     ASSESSMENT AND PLAN:   65 year old female with  past medical history of ischemic cardiomyopathy, paroxysmal 8 fibrillation, hypertension, hyperlipidemia, diabetes, chronic kidney disease stage III, chronic systolic CHF who presented to the hospital due to shortness of breath and worsening lower extremity edema.  1. Acute on chronic systolic CHF-continue milrinone, Lasix and responded well to it.  - feels better.  Plan to given one dose of Lasix today and start gentle IV fluids after in anticipation to have left & right heart cath tomorrow.  - cont. Coreg. Losartan.   2. ARF - cardiorenal in nature due to Cardiomyopathy.  - stable on Milrinone, Lasix. Nephro following.  Plan to start some gentle IV fluids later today as pt. To get contrast for cath tomorrow.   3. Ischemic CM - EF has reduced to 20% from 30-35%.  - ?? Progression of CAD.  Plan for cardiac cath on Monday.  - cont. Supportive care.  No acute chest pain. Cont. ASA, Plavix, B-blocker, Statin.   4. DM - cont. Lantus, Novolog with meals and SSI  - BS stable.    5. Hyperlipidemia - cont. Atorvastatin.   All the records are reviewed and case discussed with Care Management/Social Worker. Management plans discussed with the patient, family and they are in agreement.  CODE STATUS: Full code  DVT Prophylaxis: Hep. SQ  TOTAL TIME TAKING CARE OF THIS PATIENT: 25 minutes.   POSSIBLE D/C IN 2-3 DAYS, DEPENDING ON CLINICAL CONDITION.   Henreitta Leber M.D on 05/19/2016 at 11:20 AM  Between 7am to 6pm - Pager - [phone]  After 6pm go to [email or web address] - Proofreader  [facility]  Office  [phone]  CC: Primary care physician; Pcp Not In System

## 2016-05-19 NOTE — Plan of Care (Signed)
Problem: Physical Regulation: Goal: Ability to maintain clinical measurements within normal limits will improve Outcome: Completed/Met Date Met: 05/19/16 VS WNL.

## 2016-05-19 NOTE — Plan of Care (Signed)
Problem: Tissue Perfusion: Goal: Risk factors for ineffective tissue perfusion will decrease Outcome: Progressing BLE edema improving.

## 2016-05-19 NOTE — Plan of Care (Signed)
Problem: Physical Regulation: Goal: Will remain free from infection Outcome: Progressing No infection.

## 2016-05-19 NOTE — Progress Notes (Signed)
[facility]  ROUNDING NOTE   Subjective:    milrinone gtt  UOP not recorded Creatinine stable.     Objective:  Vital signs in last 24 hours:  Temp:  [97.8 F (36.6 C)-98.4 F (36.9 C)] 98.4 F (36.9 C) (12/10 0400) Pulse Rate:  [66-79] 73 (12/10 0835) Resp:  [15-30] 17 (12/10 0600) BP: (100-143)/(58-87) 131/82 (12/10 0835) SpO2:  [89 %-97 %] 94 % (12/10 0600) Weight:  [72.4 kg (159 lb 9.8 oz)] 72.4 kg (159 lb 9.8 oz) (12/10 0425)  Weight change: -2 kg (-4 lb 6.6 oz) Filed Weights   05/17/16 0500 05/18/16 0441 05/19/16 0425  Weight: 74.1 kg (163 lb 5.8 oz) 74.4 kg (164 lb 0.4 oz) 72.4 kg (159 lb 9.8 oz)    Intake/Output: I/O last 3 completed shifts: In: 684.7 [P.O.:480; I.V.:204.7] Out: -    Intake/Output this shift:  No intake/output data recorded.  Physical Exam: General: NAD, laying in bed  Head: Normocephalic, atraumatic. Moist oral mucosal membranes  Eyes: Anicteric, PERRL  Neck: Supple, trachea midline  Lungs:  Clear bilaterally  Heart: Regular rate and rhythm  Abdomen:  Soft, nontender  Extremities:  1+ peripheral edema, left greater than right  Neurologic: Nonfocal, moving all four extremities  Skin: No lesions       Basic Metabolic Panel:  Recent Labs Lab 05/15/16 0420 05/16/16 0451 05/17/16 0318 05/18/16 0524 05/19/16 0434  NA 134* 134* 136 135 132*  K 3.8 3.7 3.8 4.6 4.4  CL 95* 98* 99* 98* 96*  CO2 31 28 31 31 31   GLUCOSE 190* 259* 136* 249* 309*  BUN 63* 65* 61* 57* 57*  CREATININE 1.91* 1.75* 1.75* 1.81* 1.76*  CALCIUM 8.4* 8.1* 8.1* 8.5* 8.3*    Liver Function Tests:  Recent Labs Lab 05/18/16 1104  AST 21  ALT 29  ALKPHOS 278*  BILITOT 0.6  PROT 5.9*  ALBUMIN 2.7*   No results for input(s): LIPASE, AMYLASE in the last 168 hours. No results for input(s): AMMONIA in the last 168 hours.  CBC:  Recent Labs Lab 05/13/16 0855 05/14/16 0357 05/19/16 0434  WBC 7.0 5.4 6.8  HGB 12.2 10.3* 9.7*  HCT 38.0  31.2* 29.5*  MCV 82.3 81.7 82.3  PLT 316 286 293    Cardiac Enzymes:  Recent Labs Lab 05/13/16 0905 05/13/16 1137 05/13/16 1538 05/13/16 1948  TROPONINI 0.51* 0.46* 0.56* 0.68*    BNP: Invalid input(s): POCBNP  CBG:  Recent Labs Lab 05/18/16 0730 05/18/16 1156 05/18/16 1603 05/18/16 2207 05/19/16 0720  GLUCAP 229* 88 186* 333* 275*    Microbiology: Results for orders placed or performed during the hospital encounter of 05/13/16  MRSA PCR Screening     Status: None   Collection Time: 05/15/16  7:02 PM  Result Value Ref Range Status   MRSA by PCR NEGATIVE NEGATIVE Final    Comment:        The GeneXpert MRSA Assay (FDA approved for NASAL specimens only), is one component of a comprehensive MRSA colonization surveillance program. It is not intended to diagnose MRSA infection nor to guide or monitor treatment for MRSA infections.     Coagulation Studies: No results for input(s): LABPROT, INR in the last 72 hours.  Urinalysis: No results for input(s): COLORURINE, LABSPEC, PHURINE, GLUCOSEU, HGBUR, BILIRUBINUR, KETONESUR, PROTEINUR, UROBILINOGEN, NITRITE, LEUKOCYTESUR in the last 72 hours.  Invalid input(s): APPERANCEUR    Imaging: US Venous Img Lower Bilateral  Result Date: 05/18/2016 CLINICAL DATA:  Bilateral lower extremity edema.  History of pulmonary embolism. Evaluate for DVT. EXAM: BILATERAL LOWER EXTREMITY VENOUS DOPPLER ULTRASOUND TECHNIQUE: Gray-scale sonography with graded compression, as well as color Doppler and duplex ultrasound were performed to evaluate the lower extremity deep venous systems from the level of the common femoral vein and including the common femoral, femoral, profunda femoral, popliteal and calf veins including the posterior tibial, peroneal and gastrocnemius veins when visible. The superficial great saphenous vein was also interrogated. Spectral Doppler was utilized to evaluate flow at rest and with distal augmentation maneuvers  in the common femoral, femoral and popliteal veins. COMPARISON:  None. FINDINGS: RIGHT LOWER EXTREMITY Common Femoral Vein: No evidence of thrombus. Normal compressibility, respiratory phasicity and response to augmentation. Saphenofemoral Junction: No evidence of thrombus. Normal compressibility and flow on color Doppler imaging. Profunda Femoral Vein: No evidence of thrombus. Normal compressibility and flow on color Doppler imaging. Femoral Vein: No evidence of thrombus. Normal compressibility, respiratory phasicity and response to augmentation. Popliteal Vein: No evidence of thrombus. Normal compressibility, respiratory phasicity and response to augmentation. Calf Veins: No evidence of thrombus. Normal compressibility and flow on color Doppler imaging. Superficial Great Saphenous Vein: No evidence of thrombus. Normal compressibility and flow on color Doppler imaging. Venous Reflux:  None. Other Findings:  None. LEFT LOWER EXTREMITY Common Femoral Vein: No evidence of thrombus. Normal compressibility, respiratory phasicity and response to augmentation. Saphenofemoral Junction: No evidence of thrombus. Normal compressibility and flow on color Doppler imaging. Profunda Femoral Vein: No evidence of thrombus. Normal compressibility and flow on color Doppler imaging. Femoral Vein: No evidence of thrombus. Normal compressibility, respiratory phasicity and response to augmentation. Popliteal Vein: No evidence of thrombus. Normal compressibility, respiratory phasicity and response to augmentation. Calf Veins: No evidence of thrombus. Normal compressibility and flow on color Doppler imaging. Superficial Great Saphenous Vein: No evidence of thrombus. Normal compressibility and flow on color Doppler imaging. Venous Reflux:  None. Other Findings:  None. IMPRESSION: No evidence of DVT within either lower extremity. Electronically Signed   By: Sandi Mariscal M.D.   On: 05/18/2016 13:31     Medications:   . [START ON  05/20/2016] sodium chloride    . milrinone 0.25 mcg/kg/min (05/17/16 1905)   . aspirin  81 mg Oral Daily  . [START ON 05/20/2016] aspirin  81 mg Oral Pre-Cath  . atorvastatin  40 mg Oral q1800  . carvedilol  6.25 mg Oral BID WC  . clopidogrel  75 mg Oral Daily  . heparin  5,000 Units Subcutaneous Q8H  . insulin aspart  0-5 Units Subcutaneous QHS  . insulin aspart  0-9 Units Subcutaneous TID WC  . insulin aspart  3 Units Subcutaneous TID WC  . insulin glargine  15 Units Subcutaneous QHS  . isosorbide mononitrate  30 mg Oral BID  . losartan  50 mg Oral Daily  . potassium chloride  20 mEq Oral BID  . sodium chloride flush  3 mL Intravenous Q12H  . sodium chloride flush  3 mL Intravenous Q12H     Assessment/ Plan:  [name] is a 65 y.o. white female with coronary artery disease, diabetes mellitus type II, congestive heart failure, atrial fibrillation, anemia, hypertension, hyperlipidemia  1. Acute renal failure on Chronic kidney disease stage III with proteinuria: chronic kidney disease secondary to hypertension and diabetes. Baseline creatinine of 1.6 GFR of 34 on 3/17 Acute renal failure from acute cardio-renal syndrome.  - IV furosemide.  - Hold losartan in anticipation of cardiac catheterization  2. Hypertension and acute on  chronic systolic congestive heart failure with EF of 20-25%. Atrial fibrillation Blood pressure at goal. - placed on milrinone  - appreciate cards input. Catheterization Monday?   3. Diabetes mellitus type II with chronic kidney disease: insulin dependent. History of not well controlled.  Not well controlled. Hemoglobin A1c 11.4%   LOS: Lucas, Haig Gerardo 12/10/20179:44 AM

## 2016-05-20 ENCOUNTER — Encounter
Admission: EM | Disposition: A | Discharge status: Other Acute Inpatient Hospital | Payer: Self-pay | Source: Home / Self Care | Attending: Internal Medicine

## 2016-05-20 ENCOUNTER — Inpatient Hospital Stay (HOSPITAL_COMMUNITY)
Admission: AD | Admit: 2016-05-20 | Discharge: 2016-06-14 | DRG: 233 | Disposition: A | Discharge status: Home Health | Payer: Medicaid Other | Source: Other Acute Inpatient Hospital | Attending: Cardiothoracic Surgery | Admitting: Cardiothoracic Surgery

## 2016-05-20 ENCOUNTER — Encounter: Payer: Self-pay | Admitting: Cardiovascular Disease

## 2016-05-20 DIAGNOSIS — I255 Ischemic cardiomyopathy: Secondary | ICD-10-CM | POA: Diagnosis present

## 2016-05-20 DIAGNOSIS — R0602 Shortness of breath: Secondary | ICD-10-CM

## 2016-05-20 DIAGNOSIS — I9581 Postprocedural hypotension: Secondary | ICD-10-CM | POA: Diagnosis not present

## 2016-05-20 DIAGNOSIS — E875 Hyperkalemia: Secondary | ICD-10-CM | POA: Diagnosis present

## 2016-05-20 DIAGNOSIS — N179 Acute kidney failure, unspecified: Secondary | ICD-10-CM

## 2016-05-20 DIAGNOSIS — I481 Persistent atrial fibrillation: Secondary | ICD-10-CM | POA: Diagnosis not present

## 2016-05-20 DIAGNOSIS — I5082 Biventricular heart failure: Secondary | ICD-10-CM | POA: Diagnosis present

## 2016-05-20 DIAGNOSIS — Z7902 Long term (current) use of antithrombotics/antiplatelets: Secondary | ICD-10-CM

## 2016-05-20 DIAGNOSIS — J9811 Atelectasis: Secondary | ICD-10-CM

## 2016-05-20 DIAGNOSIS — Z7982 Long term (current) use of aspirin: Secondary | ICD-10-CM

## 2016-05-20 DIAGNOSIS — I480 Paroxysmal atrial fibrillation: Secondary | ICD-10-CM | POA: Diagnosis not present

## 2016-05-20 DIAGNOSIS — D62 Acute posthemorrhagic anemia: Secondary | ICD-10-CM | POA: Diagnosis not present

## 2016-05-20 DIAGNOSIS — I420 Dilated cardiomyopathy: Secondary | ICD-10-CM

## 2016-05-20 DIAGNOSIS — Z79899 Other long term (current) drug therapy: Secondary | ICD-10-CM

## 2016-05-20 DIAGNOSIS — Z6825 Body mass index (BMI) 25.0-25.9, adult: Secondary | ICD-10-CM

## 2016-05-20 DIAGNOSIS — J449 Chronic obstructive pulmonary disease, unspecified: Secondary | ICD-10-CM | POA: Diagnosis present

## 2016-05-20 DIAGNOSIS — I130 Hypertensive heart and chronic kidney disease with heart failure and stage 1 through stage 4 chronic kidney disease, or unspecified chronic kidney disease: Secondary | ICD-10-CM | POA: Diagnosis present

## 2016-05-20 DIAGNOSIS — Z955 Presence of coronary angioplasty implant and graft: Secondary | ICD-10-CM

## 2016-05-20 DIAGNOSIS — Z8249 Family history of ischemic heart disease and other diseases of the circulatory system: Secondary | ICD-10-CM

## 2016-05-20 DIAGNOSIS — Z794 Long term (current) use of insulin: Secondary | ICD-10-CM

## 2016-05-20 DIAGNOSIS — I2510 Atherosclerotic heart disease of native coronary artery without angina pectoris: Secondary | ICD-10-CM

## 2016-05-20 DIAGNOSIS — I272 Pulmonary hypertension, unspecified: Secondary | ICD-10-CM | POA: Diagnosis present

## 2016-05-20 DIAGNOSIS — L03115 Cellulitis of right lower limb: Secondary | ICD-10-CM | POA: Diagnosis not present

## 2016-05-20 DIAGNOSIS — E11649 Type 2 diabetes mellitus with hypoglycemia without coma: Secondary | ICD-10-CM | POA: Diagnosis not present

## 2016-05-20 DIAGNOSIS — E1122 Type 2 diabetes mellitus with diabetic chronic kidney disease: Secondary | ICD-10-CM | POA: Diagnosis present

## 2016-05-20 DIAGNOSIS — N183 Chronic kidney disease, stage 3 unspecified: Secondary | ICD-10-CM

## 2016-05-20 DIAGNOSIS — Z951 Presence of aortocoronary bypass graft: Secondary | ICD-10-CM

## 2016-05-20 DIAGNOSIS — E118 Type 2 diabetes mellitus with unspecified complications: Secondary | ICD-10-CM | POA: Diagnosis present

## 2016-05-20 DIAGNOSIS — I214 Non-ST elevation (NSTEMI) myocardial infarction: Principal | ICD-10-CM | POA: Diagnosis present

## 2016-05-20 DIAGNOSIS — L03116 Cellulitis of left lower limb: Secondary | ICD-10-CM | POA: Diagnosis not present

## 2016-05-20 DIAGNOSIS — N170 Acute kidney failure with tubular necrosis: Secondary | ICD-10-CM | POA: Diagnosis not present

## 2016-05-20 DIAGNOSIS — I10 Essential (primary) hypertension: Secondary | ICD-10-CM | POA: Diagnosis present

## 2016-05-20 DIAGNOSIS — E785 Hyperlipidemia, unspecified: Secondary | ICD-10-CM | POA: Diagnosis present

## 2016-05-20 DIAGNOSIS — N189 Chronic kidney disease, unspecified: Secondary | ICD-10-CM

## 2016-05-20 DIAGNOSIS — I5043 Acute on chronic combined systolic (congestive) and diastolic (congestive) heart failure: Secondary | ICD-10-CM | POA: Diagnosis present

## 2016-05-20 DIAGNOSIS — E1165 Type 2 diabetes mellitus with hyperglycemia: Secondary | ICD-10-CM | POA: Diagnosis present

## 2016-05-20 DIAGNOSIS — Z09 Encounter for follow-up examination after completed treatment for conditions other than malignant neoplasm: Secondary | ICD-10-CM

## 2016-05-20 DIAGNOSIS — I509 Heart failure, unspecified: Secondary | ICD-10-CM

## 2016-05-20 DIAGNOSIS — N184 Chronic kidney disease, stage 4 (severe): Secondary | ICD-10-CM | POA: Diagnosis present

## 2016-05-20 DIAGNOSIS — I5023 Acute on chronic systolic (congestive) heart failure: Secondary | ICD-10-CM

## 2016-05-20 DIAGNOSIS — E871 Hypo-osmolality and hyponatremia: Secondary | ICD-10-CM | POA: Diagnosis not present

## 2016-05-20 DIAGNOSIS — Z9689 Presence of other specified functional implants: Secondary | ICD-10-CM

## 2016-05-20 DIAGNOSIS — E876 Hypokalemia: Secondary | ICD-10-CM | POA: Diagnosis not present

## 2016-05-20 HISTORY — PX: CARDIAC CATHETERIZATION: SHX172

## 2016-05-20 LAB — CREATININE, SERUM
CREATININE: 1.53 mg/dL — AB (ref 0.44–1.00)
GFR calc Af Amer: 40 mL/min — ABNORMAL LOW (ref >60)
GFR calc non Af Amer: 35 mL/min — ABNORMAL LOW (ref >60)

## 2016-05-20 LAB — RENAL FUNCTION PANEL
ALBUMIN: 2.5 g/dL — AB (ref 3.5–5.0)
Anion gap: 6 (ref 5–15)
BUN: 55 mg/dL — AB (ref 6–20)
CHLORIDE: 97 mmol/L — AB (ref 101–111)
CO2: 30 mmol/L (ref 22–32)
CREATININE: 1.66 mg/dL — AB (ref 0.44–1.00)
Calcium: 8.3 mg/dL — ABNORMAL LOW (ref 8.9–10.3)
GFR calc Af Amer: 37 mL/min — ABNORMAL LOW (ref >60)
GFR, EST NON AFRICAN AMERICAN: 32 mL/min — AB (ref >60)
Glucose, Bld: 301 mg/dL — ABNORMAL HIGH (ref 65–99)
PHOSPHORUS: 3.4 mg/dL (ref 2.5–4.6)
POTASSIUM: 4.2 mmol/L (ref 3.5–5.1)
Sodium: 133 mmol/L — ABNORMAL LOW (ref 135–145)

## 2016-05-20 LAB — CBC
HCT: 30.9 % — ABNORMAL LOW (ref 36.0–46.0)
HEMOGLOBIN: 9.9 g/dL — AB (ref 12.0–15.0)
MCH: 26.6 pg (ref 26.0–34.0)
MCHC: 32 g/dL (ref 30.0–36.0)
MCV: 83.1 fL (ref 78.0–100.0)
Platelets: 306 10*3/uL (ref 150–400)
RBC: 3.72 MIL/uL — ABNORMAL LOW (ref 3.87–5.11)
RDW: 14.9 % (ref 11.5–15.5)
WBC: 7.3 10*3/uL (ref 4.0–10.5)

## 2016-05-20 LAB — GLUCOSE, CAPILLARY
GLUCOSE-CAPILLARY: 233 mg/dL — AB (ref 65–99)
GLUCOSE-CAPILLARY: 303 mg/dL — AB (ref 65–99)
Glucose-Capillary: 250 mg/dL — ABNORMAL HIGH (ref 65–99)
Glucose-Capillary: 269 mg/dL — ABNORMAL HIGH (ref 65–99)

## 2016-05-20 LAB — PROTIME-INR
INR: 1.01
Prothrombin Time: 13.3 seconds (ref 11.4–15.2)

## 2016-05-20 SURGERY — RIGHT/LEFT HEART CATH AND CORONARY ANGIOGRAPHY
Anesthesia: Moderate Sedation

## 2016-05-20 MED ORDER — CHLORHEXIDINE GLUCONATE CLOTH 2 % EX PADS: 6.0000 | MEDICATED_PAD | Freq: Every day | CUTANEOUS | Status: DC

## 2016-05-20 MED ORDER — INSULIN GLARGINE 100 UNIT/ML ~~LOC~~ SOLN
15.0000 [IU] | Freq: Every day | SUBCUTANEOUS | Status: DC
  Administered 2016-05-20: 15 [IU] via SUBCUTANEOUS
  Filled 2016-05-20 (×2): qty 0.15

## 2016-05-20 MED ORDER — FENTANYL CITRATE (PF) 100 MCG/2ML IJ SOLN
INTRAMUSCULAR | Status: DC | PRN
  Administered 2016-05-20: 25 ug via INTRAVENOUS

## 2016-05-20 MED ORDER — MIDAZOLAM HCL 2 MG/2ML IJ SOLN
INTRAMUSCULAR | Status: DC | PRN
  Administered 2016-05-20: 1 mg via INTRAVENOUS

## 2016-05-20 MED ORDER — ISOSORBIDE MONONITRATE ER 30 MG PO TB24
30.0000 mg | ORAL_TABLET | Freq: Two times a day (BID) | ORAL | Status: DC
  Administered 2016-05-20 – 2016-05-26 (×13): 30 mg via ORAL
  Filled 2016-05-20 (×13): qty 1

## 2016-05-20 MED ORDER — ONDANSETRON HCL 4 MG/2ML IJ SOLN: 4.0000 mg | Freq: Four times a day (QID) | INTRAMUSCULAR | Status: DC | PRN

## 2016-05-20 MED ORDER — MUPIROCIN 2 % EX OINT: 1.0000 "application " | TOPICAL_OINTMENT | Freq: Two times a day (BID) | CUTANEOUS | Status: DC

## 2016-05-20 MED ORDER — MILRINONE LACTATE IN DEXTROSE 20-5 MG/100ML-% IV SOLN: 0.2500 ug/kg/min | INTRAVENOUS | 0 refills | Status: DC

## 2016-05-20 MED ORDER — NITROGLYCERIN 0.4 MG SL SUBL: 0.4000 mg | SUBLINGUAL_TABLET | SUBLINGUAL | Status: DC | PRN

## 2016-05-20 MED ORDER — POTASSIUM CHLORIDE CRYS ER 20 MEQ PO TBCR: 20.0000 meq | EXTENDED_RELEASE_TABLET | Freq: Two times a day (BID) | ORAL | 0 refills | Status: DC

## 2016-05-20 MED ORDER — FUROSEMIDE 10 MG/ML IJ SOLN
60.0000 mg | Freq: Two times a day (BID) | INTRAMUSCULAR | Status: DC
  Administered 2016-05-20 – 2016-05-21 (×2): 60 mg via INTRAVENOUS
  Filled 2016-05-20 (×2): qty 6

## 2016-05-20 MED ORDER — HEPARIN SODIUM (PORCINE) 5000 UNIT/ML IJ SOLN
5000.0000 [IU] | Freq: Three times a day (TID) | INTRAMUSCULAR | Status: DC
  Administered 2016-05-20 – 2016-05-26 (×16): 5000 [IU] via SUBCUTANEOUS
  Filled 2016-05-20 (×17): qty 1

## 2016-05-20 MED ORDER — HEPARIN (PORCINE) IN NACL 2-0.9 UNIT/ML-% IJ SOLN
INTRAMUSCULAR | Status: AC
  Filled 2016-05-20: qty 500

## 2016-05-20 MED ORDER — MIDAZOLAM HCL 2 MG/2ML IJ SOLN
INTRAMUSCULAR | Status: AC
  Filled 2016-05-20: qty 2

## 2016-05-20 MED ORDER — ASPIRIN EC 81 MG PO TBEC
81.0000 mg | DELAYED_RELEASE_TABLET | Freq: Every day | ORAL | Status: DC
  Administered 2016-05-21 – 2016-05-26 (×6): 81 mg via ORAL
  Filled 2016-05-20 (×6): qty 1

## 2016-05-20 MED ORDER — ACETAMINOPHEN 325 MG PO TABS: 650.0000 mg | ORAL_TABLET | ORAL | Status: DC | PRN

## 2016-05-20 MED ORDER — ASPIRIN EC 81 MG PO TBEC: 81.0000 mg | DELAYED_RELEASE_TABLET | Freq: Every day | ORAL | Status: DC

## 2016-05-20 MED ORDER — MILRINONE LACTATE IN DEXTROSE 20-5 MG/100ML-% IV SOLN
0.2500 ug/kg/min | INTRAVENOUS | Status: DC
  Administered 2016-05-20 – 2016-05-21 (×2): 0.25 ug/kg/min via INTRAVENOUS
  Filled 2016-05-20 (×3): qty 100

## 2016-05-20 MED ORDER — MILRINONE LACTATE IN DEXTROSE 20-5 MG/100ML-% IV SOLN: 0.2500 ug/kg/min | INTRAVENOUS | Status: DC

## 2016-05-20 MED ORDER — FUROSEMIDE 40 MG PO TABS: 60.0000 mg | ORAL_TABLET | Freq: Two times a day (BID) | ORAL | 0 refills | Status: DC

## 2016-05-20 MED ORDER — FENTANYL CITRATE (PF) 100 MCG/2ML IJ SOLN
INTRAMUSCULAR | Status: AC
  Filled 2016-05-20: qty 2

## 2016-05-20 MED ORDER — SODIUM CHLORIDE 0.9 % IV SOLN
INTRAVENOUS | Status: DC
  Administered 2016-05-20: 23:00:00 via INTRAVENOUS

## 2016-05-20 MED ORDER — CARVEDILOL 6.25 MG PO TABS: 6.2500 mg | ORAL_TABLET | Freq: Two times a day (BID) | ORAL | 0 refills | Status: DC

## 2016-05-20 MED ORDER — CARVEDILOL 6.25 MG PO TABS
6.2500 mg | ORAL_TABLET | Freq: Two times a day (BID) | ORAL | Status: DC
  Administered 2016-05-20 – 2016-05-26 (×12): 6.25 mg via ORAL
  Filled 2016-05-20 (×12): qty 1

## 2016-05-20 MED ORDER — INSULIN ASPART 100 UNIT/ML ~~LOC~~ SOLN
0.0000 [IU] | Freq: Three times a day (TID) | SUBCUTANEOUS | Status: DC
  Administered 2016-05-21: 7 [IU] via SUBCUTANEOUS
  Administered 2016-05-21: 9 [IU] via SUBCUTANEOUS
  Administered 2016-05-21 – 2016-05-22 (×2): 3 [IU] via SUBCUTANEOUS
  Administered 2016-05-22: 5 [IU] via SUBCUTANEOUS
  Administered 2016-05-23: 2 [IU] via SUBCUTANEOUS
  Administered 2016-05-24: 5 [IU] via SUBCUTANEOUS
  Administered 2016-05-25: 2 [IU] via SUBCUTANEOUS
  Administered 2016-05-25: 5 [IU] via SUBCUTANEOUS
  Administered 2016-05-26: 1 [IU] via SUBCUTANEOUS
  Administered 2016-05-26 (×2): 2 [IU] via SUBCUTANEOUS

## 2016-05-20 MED ORDER — ATORVASTATIN CALCIUM 40 MG PO TABS
40.0000 mg | ORAL_TABLET | Freq: Every day | ORAL | Status: DC
Start: 2016-05-21 — End: 2016-06-14
  Administered 2016-05-21 – 2016-06-13 (×15): 40 mg via ORAL
  Filled 2016-05-20 (×20): qty 1

## 2016-05-20 MED ORDER — POTASSIUM CHLORIDE CRYS ER 20 MEQ PO TBCR
20.0000 meq | EXTENDED_RELEASE_TABLET | Freq: Two times a day (BID) | ORAL | Status: DC
  Administered 2016-05-20: 20 meq via ORAL
  Filled 2016-05-20: qty 1

## 2016-05-20 MED ORDER — IOPAMIDOL (ISOVUE-300) INJECTION 61%
INTRAVENOUS | Status: DC | PRN
  Administered 2016-05-20: 55 mL via INTRA_ARTERIAL

## 2016-05-20 SURGICAL SUPPLY — 13 items
CATH INFINITI 5FR ANG PIGTAIL (CATHETERS) ×3 IMPLANT
CATH INFINITI 5FR JL4 (CATHETERS) ×3 IMPLANT
CATH INFINITI JR4 5F (CATHETERS) ×3 IMPLANT
CATH SWANZ 7F THERMO (CATHETERS) ×3 IMPLANT
DEVICE CLOSURE MYNXGRIP 5F (Vascular Products) ×3 IMPLANT
GUIDEWIRE EMER 3M J .025X150CM (WIRE) ×3 IMPLANT
KIT MANI 3VAL PERCEP (MISCELLANEOUS) ×3 IMPLANT
KIT RIGHT HEART (MISCELLANEOUS) ×3 IMPLANT
NEEDLE PERC 18GX7CM (NEEDLE) ×3 IMPLANT
PACK CARDIAC CATH (CUSTOM PROCEDURE TRAY) ×3 IMPLANT
SHEATH AVANTI 5FR X 11CM (SHEATH) ×3 IMPLANT
SHEATH PINNACLE 7F 10CM (SHEATH) ×3 IMPLANT
WIRE EMERALD 3MM-J .035X150CM (WIRE) ×3 IMPLANT

## 2016-05-20 NOTE — Progress Notes (Signed)
Patient being transferred to [facility] by [facility]. Report given to Nevin Bloodgood, RN and Marya Amsler EMT/Paramedic from [place].

## 2016-05-20 NOTE — Discharge Instructions (Signed)
Heart Failure Clinic appointment on May 24, 2016 at 9:00am with Darylene Price, [place]. Please call [phone] to reschedule.   Coronary Artery Disease, Female Coronary artery disease (CAD) is a process in which the blood vessels of the heart (coronary arteries) become narrow or blocked. The narrowing or blockage can lead to decreased blood flow to the heart muscle (angina). Symptoms known as angina can develop if the blood flow is reduced to the heart for a short period of time. Prolonged reduced blood flow can cause a heart attack (myocardial infarction, MI). CAD is a leading cause of death for women. More women die from CAD than from cancer, lung disease, and accidents combined. It is important for women to understand the risks, symptoms, and treatment options for CAD. What are the causes? Atherosclerosis is the cause of CAD. Atherosclerosis is the buildup of fat and cholesterol (plaque) on the inside of the arteries. Over time, the plaque may narrow or block the artery, and this will lessen blood flow to the heart. Plaque can also become weak and break off within a coronary artery to form a clot and cause a sudden blockage. What increases the risk? Many risk factors increase your chances of getting CAD, including:  High cholesterol levels.  High blood pressure (hypertension).  Tobacco use.  Diabetes.  Age. Women over age 9 are at a greater risk of CAD.  Menopause.  All postmenopausal women are at greater risk of CAD.  Women who have experienced menopause between the ages of 74-45 (early menopause) are at a higher risk of CAD.  Women who have experienced menopause before age 49 (premature menopause) are at an extremely high risk of CAD.  Family history of CAD.  Obesity.  Lack of exercise.  A diet high in saturated fats. What are the signs or symptoms? Many people do not experience any symptoms during the early stages of CAD. As the condition progresses, symptoms may  include:  Chest pain.  The pain can be described as crushing or squeezing, or a tightness, pressure, fullness, or heaviness in the chest.  The pain can last more than a few minutes or can stop and recur.  Pain in the arms, neck, jaw, or back.  Unexplained heartburn or indigestion.  Shortness of breath.  Nausea.  Sudden cold sweats.  Sudden light-headedness. Many women have chest discomfort and the other symptoms. However, women often have different (atypical) symptoms, such as:  Fatigue.  Unexplained feelings of nervousness or anxiety.  Unexplained weakness.  Dizziness or fainting. Sometimes, women may not have any symptoms of CAD. How is this diagnosed? Tests to diagnose CAD may include:  ECG (electrocardiogram).  Exercise stress test. This looks for signs of blockage when the heart is being exercised.  Pharmacologic stress test. This test looks for signs of blockage when the heart is being stressed with a medicine.  Blood tests.  Coronary angiogram. This is a procedure to look at the coronary arteries to see if there is any blockage. How is this treated? The treatment of CAD may include the following:  Healthy behavioral changes to reduce or control risk factors.  Medicine.  Coronary stenting.A stent helps to keep an artery open.  Coronary angioplasty. This procedure widens a narrowed or blocked artery.  Coronary arterybypass surgery. This will allow your blood to pass the blockage (bypass) to reach your heart. Follow these instructions at home:  Take medicines only as directed by your health care provider.  Do not take the following medicines unless  your health care provider approves:  Nonsteroidal anti-inflammatory drugs (NSAIDs), such as ibuprofen, naproxen, or celecoxib.  Vitamin supplements that contain vitamin A, vitamin E, or both.  Hormone replacement therapy that contains estrogen with or without progestin.  Manage other health conditions  such as hypertension and diabetes as directed by your health care provider.  Follow a heart-healthy diet. A dietitian can help to educate you about healthy food options and changes.  Use healthy cooking methods such as roasting, grilling, broiling, baking, poaching, steaming, or stir-frying. Talk to a dietitian to learn more about healthy cooking methods.  Follow an exercise program approved by your health care provider.  Maintain a healthy weight. Lose weight as approved by your health care provider.  Plan rest periods when fatigued.  Learn to manage stress.  Do not use any tobacco products, including cigarettes, chewing tobacco, or electronic cigarettes. If you need help quitting, ask your health care provider.  If you drink alcohol, and your health care provider approves, limit your alcohol intake to no more than 1 drink per day. One drink equals 12 ounces of beer, 5 ounces of wine, or 1 ounces of hard liquor.  Stop illegal drug use.  Your health care provider may ask you to monitor your blood pressure. A blood pressure reading consists of a higher number over a lower number, such as 110 over 72, which is written as 110/72. Ideally, your blood pressure should be:  Below 140/90 if you have no other medical conditions.  Below 130/80 if you have diabetes or kidney disease.  Keep all follow-up visits as directed by your health care provider. This is important. Get help right away if:  You have pain in your chest, neck, arm, jaw, stomach, or back that lasts more than a few minutes, is recurring, or is unrelieved by taking medicine under your tongue (sublingualnitroglycerin).  You have profuse sweating without cause.  You have unexplained:  Heartburn or indigestion.  Shortness of breath or difficulty breathing.  Nausea or vomiting.  Fatigue.  Feelings of nervousness or anxiety.  Weakness.  Diarrhea.  You have sudden light-headedness or dizziness.  You faint. These  symptoms may represent a serious problem that is an emergency. Do not wait to see if the symptoms will go away. Get medical help right away. Call your local emergency services (911 in the [place] Do not drive yourself to the hospital.  This information is not intended to replace advice given to you by your health care provider. Make sure you discuss any questions you have with your health care provider. Document Released: 08/19/2011 Document Revised: 11/02/2015 Document Reviewed: 09/28/2013 Elsevier Interactive Patient Education  2017 [employer].

## 2016-05-20 NOTE — Progress Notes (Signed)
Report to [facility] telemetry.  Check right groin for bleeding or hematoma.  Patient will be on bedrest for 2 hours post sheath pull---out of bed at 11:00.  Bilateral pulses are 2's DP's.Marland Kitchen

## 2016-05-20 NOTE — Progress Notes (Signed)
Patient Name: Theresa Rogers Date of Encounter: 05/20/2016  Primary Cardiologist: Kathlyn Sacramento, MD  Hospital Problem List     Principal Problem:   Acute on chronic systolic CHF (congestive heart failure) (HCC) Active Problems:   Bilateral lower extremity edema   Dyspnea   Coronary artery disease involving coronary bypass graft of native heart without angina pectoris   Cardiomyopathy, ischemic   Systolic dysfunction   Acute renal failure superimposed on stage 3 chronic kidney disease ([place])   Poorly controlled type 2 diabetes mellitus with complication (HCC)   Cardiorenal syndrome   Heart failure (HCC)     Subjective   . She is feeling better overall with no chest pain. She reports improvement in dyspnea and cough. She still has mild leg edema.  Inpatient Medications    Scheduled Meds: . aspirin  81 mg Oral Daily  . atorvastatin  40 mg Oral q1800  . carvedilol  6.25 mg Oral BID WC  . clopidogrel  75 mg Oral Daily  . heparin  5,000 Units Subcutaneous Q8H  . insulin aspart  0-5 Units Subcutaneous QHS  . insulin aspart  0-9 Units Subcutaneous TID WC  . insulin aspart  3 Units Subcutaneous TID WC  . insulin glargine  10 Units Subcutaneous QHS  . isosorbide mononitrate  30 mg Oral BID  . potassium chloride  20 mEq Oral BID  . sodium chloride flush  3 mL Intravenous Q12H  . sodium chloride flush  3 mL Intravenous Q12H   Continuous Infusions: . sodium chloride 50 mL/hr at 05/20/16 0600  . milrinone 0.25 mcg/kg/min (05/20/16 0600)   PRN Meds: sodium chloride, ondansetron (ZOFRAN) IV, sodium chloride flush   Vital Signs    Vitals:   05/20/16 0500 05/20/16 0600 05/20/16 0758 05/20/16 0830  BP: (!) 145/76 (!) 142/81 (!) 148/84 125/63  Pulse: 69 73 73 74  Resp: 19 20  20   Temp:      TempSrc:      SpO2: 90% 95%  94%  Weight: 155 lb 3.3 oz (70.4 kg)     Height:        Intake/Output Summary (Last 24 hours) at 05/20/16 0859 Last data filed at 05/20/16 0600  Gross per 24 hour  Intake           850.77 ml  Output             1500 ml  Net          -649.23 ml   Filed Weights   05/18/16 0441 05/19/16 0425 05/20/16 0500  Weight: 164 lb 0.4 oz (74.4 kg) 159 lb 9.8 oz (72.4 kg) 155 lb 3.3 oz (70.4 kg)    Physical Exam   GEN: Well nourished, well developed, in no acute distress. She is seated comfortably on the bedside. HEENT: Grossly normal.  Neck: Supple. JVP approximately 8 cm with positive HJR. Cardiac: RRR, no murmurs, rubs, or gallops. 1+ left and trace right calf edema.  Radials/DP/PT 2+ and equal bilaterally.  Respiratory:  Respirations regular and unlabored, with mildly diminished breath sounds at both bases. GI: Soft, nontender, nondistended, BS + x 4. MS: no deformity or atrophy. Skin: warm and dry, no rash. Neuro:  Strength and sensation are intact. Psych: AAOx3.  Normal affect.  Labs    CBC  Recent Labs  05/19/16 0434  WBC 6.8  HGB 9.7*  HCT 29.5*  MCV 82.3  PLT 616   Basic Metabolic Panel  Recent Labs  05/19/16 0434 05/20/16  0433  NA 132* 133*  K 4.4 4.2  CL 96* 97*  CO2 31 30  GLUCOSE 309* 301*  BUN 57* 55*  CREATININE 1.76* 1.66*  CALCIUM 8.3* 8.3*  PHOS  --  3.4   Liver Function Tests  Recent Labs  05/18/16 1104 05/20/16 0433  AST 21  --   ALT 29  --   ALKPHOS 278*  --   BILITOT 0.6  --   PROT 5.9*  --   ALBUMIN 2.7* 2.5*    Telemetry    Normal sinus rhythm with heart rate from 60-80 bpm. No significant arrhythmias. - Personally Reviewed  ECG    Not done in the last few days  Radiology    US Venous Img Lower Bilateral  Result Date: 05/18/2016 CLINICAL DATA:  Bilateral lower extremity edema. History of pulmonary embolism. Evaluate for DVT. EXAM: BILATERAL LOWER EXTREMITY VENOUS DOPPLER ULTRASOUND TECHNIQUE: Gray-scale sonography with graded compression, as well as color Doppler and duplex ultrasound were performed to evaluate the lower extremity deep venous systems from the level of the  common femoral vein and including the common femoral, femoral, profunda femoral, popliteal and calf veins including the posterior tibial, peroneal and gastrocnemius veins when visible. The superficial great saphenous vein was also interrogated. Spectral Doppler was utilized to evaluate flow at rest and with distal augmentation maneuvers in the common femoral, femoral and popliteal veins. COMPARISON:  None. FINDINGS: RIGHT LOWER EXTREMITY Common Femoral Vein: No evidence of thrombus. Normal compressibility, respiratory phasicity and response to augmentation. Saphenofemoral Junction: No evidence of thrombus. Normal compressibility and flow on color Doppler imaging. Profunda Femoral Vein: No evidence of thrombus. Normal compressibility and flow on color Doppler imaging. Femoral Vein: No evidence of thrombus. Normal compressibility, respiratory phasicity and response to augmentation. Popliteal Vein: No evidence of thrombus. Normal compressibility, respiratory phasicity and response to augmentation. Calf Veins: No evidence of thrombus. Normal compressibility and flow on color Doppler imaging. Superficial Great Saphenous Vein: No evidence of thrombus. Normal compressibility and flow on color Doppler imaging. Venous Reflux:  None. Other Findings:  None. LEFT LOWER EXTREMITY Common Femoral Vein: No evidence of thrombus. Normal compressibility, respiratory phasicity and response to augmentation. Saphenofemoral Junction: No evidence of thrombus. Normal compressibility and flow on color Doppler imaging. Profunda Femoral Vein: No evidence of thrombus. Normal compressibility and flow on color Doppler imaging. Femoral Vein: No evidence of thrombus. Normal compressibility, respiratory phasicity and response to augmentation. Popliteal Vein: No evidence of thrombus. Normal compressibility, respiratory phasicity and response to augmentation. Calf Veins: No evidence of thrombus. Normal compressibility and flow on color Doppler  imaging. Superficial Great Saphenous Vein: No evidence of thrombus. Normal compressibility and flow on color Doppler imaging. Venous Reflux:  None. Other Findings:  None. IMPRESSION: No evidence of DVT within either lower extremity. Electronically Signed   By: Sandi Mariscal M.D.   On: 05/18/2016 13:31    Cardiac Studies   Transthoracic echocardiogram (05/14/16): Mild LV dilation with LVEF of approximately 20% with global hypokinesis as well as akinesis of the anterior, anteroseptal, and apical regions. Grade 1 diastolic dysfunction. Mild MR. Moderately dilated left atrium. Moderately reduced RV contraction. Moderate pulmonary hypertension. Trivial pericardial effusion.  Patient Profile     65 year old woman with history of chronic systolic heart failure secondary to ischemic cardiomyopathy, multivessel CAD, chronic kidney disease, hypertension, hyperlipidemia, diabetes mellitus, and paroxysmal atrial fibrillation, admitted with progressive lower extremity edema as well as cough concerning for pneumonia. She is currently on milrinone infusion for suspected  cardiorenal syndrome in the setting of volume overload.  Assessment & Plan    Acute on chronic systolic heart failure secondary to ischemic cardiomyopathy:  Echocardiogram was notable for biventricular failure and moderate pulmonary hypertension. Given decline in LV systolic function since January, there is concern for progression of CAD contributing to her declining LVEF.  The plan is to proceed with a right and left cardiac catheterization today with minimizing contrast use. The patient is aware of the procedure details as well as risks and benefits. Planned access is via the right femoral artery and vein.    Continue milrinone at 0.25 mcg/kg/min pending catheterization tomorrow. Hope to wean rapidly afterwards. Continue carvedilol 6.25 mg twice a day. Given underlying chronic kidney disease, I will consider a combination of hydralazine/Imdur. If  renal function improves, we can consider an ACE inhibitor.  Coronary artery disease: No symptoms of ongoing ischemia.  Continue dual antiplatelet therapy with aspirin and clopidogrel.  Continue isosorbide mononitrate 30 mg twice a day and carvedilol 6.25 mg twice a day  Plan for left and right heart catheterization today.  Chronic kidney disease: Patient's creatinine today is stable at 1.8, which is at or near her baseline. Gentle hydration before cardiac catheterization.  Uncontrolled diabetes mellitus: Long-term problem likely complicating her CAD, cardiomyopathy, and renal disease.   Continued management per hospitalist service.  Continue patient education regarding medication compliance.  Hyperlipidemia: Patient has declined statins in the past despite strong indications for use, CAD and diabetes mellitus.    Signed, Kathlyn Sacramento, MD  05/20/2016, 8:59 AM

## 2016-05-20 NOTE — Progress Notes (Signed)
[facility]  ROUNDING NOTE   Subjective:   On milrinone gtt  Creatinine stable.  Left leg swelling + cough    Objective:  Vital signs in last 24 hours:  Temp:  [98.2 F (36.8 C)-98.6 F (37 C)] 98.6 F (37 C) (12/11 0400) Pulse Rate:  [68-78] 70 (12/11 0900) Resp:  [15-21] 18 (12/11 0900) BP: (125-156)/(62-85) 132/71 (12/11 0900) SpO2:  [90 %-96 %] 94 % (12/11 0900) Weight:  [70.4 kg (155 lb 3.3 oz)] 70.4 kg (155 lb 3.3 oz) (12/11 0500)  Weight change: -2 kg (-4 lb 6.6 oz) Filed Weights   05/18/16 0441 05/19/16 0425 05/20/16 0500  Weight: 74.4 kg (164 lb 0.4 oz) 72.4 kg (159 lb 9.8 oz) 70.4 kg (155 lb 3.3 oz)    Intake/Output: I/O last 3 completed shifts: In: 1273.2 [P.O.:700; I.V.:573.2] Out: 1500 [Urine:1500]   Intake/Output this shift:  No intake/output data recorded.  Physical Exam: General: NAD, laying in bed  Head: Normocephalic, atraumatic. Moist oral mucosal membranes  Eyes: Anicteric,    Neck: Supple, trachea midline  Lungs:  Clear b/l today  Heart: Regular rate and rhythm  Abdomen:  Soft, nontender,   Extremities:  1+ peripheral edema, left greater than right  Neurologic: Nonfocal, moving all four extremities  Skin: No lesions       Basic Metabolic Panel:  Recent Labs Lab 05/16/16 0451 05/17/16 0318 05/18/16 0524 05/19/16 0434 05/20/16 0433  NA 134* 136 135 132* 133*  K 3.7 3.8 4.6 4.4 4.2  CL 98* 99* 98* 96* 97*  CO2 28 31 31 31 30   GLUCOSE 259* 136* 249* 309* 301*  BUN 65* 61* 57* 57* 55*  CREATININE 1.75* 1.75* 1.81* 1.76* 1.66*  CALCIUM 8.1* 8.1* 8.5* 8.3* 8.3*  PHOS  --   --   --   --  3.4    Liver Function Tests:  Recent Labs Lab 05/18/16 1104 05/20/16 0433  AST 21  --   ALT 29  --   ALKPHOS 278*  --   BILITOT 0.6  --   PROT 5.9*  --   ALBUMIN 2.7* 2.5*   No results for input(s): LIPASE, AMYLASE in the last 168 hours. No results for input(s): AMMONIA in the last 168 hours.  CBC:  Recent Labs Lab  05/14/16 0357 05/19/16 0434  WBC 5.4 6.8  HGB 10.3* 9.7*  HCT 31.2* 29.5*  MCV 81.7 82.3  PLT 286 293    Cardiac Enzymes:  Recent Labs Lab 05/13/16 1137 05/13/16 1538 05/13/16 1948  TROPONINI 0.46* 0.56* 0.68*    BNP: Invalid input(s): POCBNP  CBG:  Recent Labs Lab 05/19/16 0720 05/19/16 1150 05/19/16 1627 05/19/16 2127 05/20/16 0729  GLUCAP 275* 189* 250* 278* 250*    Microbiology: Results for orders placed or performed during the hospital encounter of 05/13/16  MRSA PCR Screening     Status: None   Collection Time: 05/15/16  7:02 PM  Result Value Ref Range Status   MRSA by PCR NEGATIVE NEGATIVE Final    Comment:        The GeneXpert MRSA Assay (FDA approved for NASAL specimens only), is one component of a comprehensive MRSA colonization surveillance program. It is not intended to diagnose MRSA infection nor to guide or monitor treatment for MRSA infections.     Coagulation Studies:  Recent Labs  05/20/16 0433  LABPROT 13.3  INR 1.01    Urinalysis: No results for input(s): COLORURINE, LABSPEC, PHURINE, GLUCOSEU, HGBUR, BILIRUBINUR, KETONESUR, PROTEINUR, UROBILINOGEN,  NITRITE, LEUKOCYTESUR in the last 72 hours.  Invalid input(s): APPERANCEUR    Imaging: US Venous Img Lower Bilateral  Result Date: 05/18/2016 CLINICAL DATA:  Bilateral lower extremity edema. History of pulmonary embolism. Evaluate for DVT. EXAM: BILATERAL LOWER EXTREMITY VENOUS DOPPLER ULTRASOUND TECHNIQUE: Gray-scale sonography with graded compression, as well as color Doppler and duplex ultrasound were performed to evaluate the lower extremity deep venous systems from the level of the common femoral vein and including the common femoral, femoral, profunda femoral, popliteal and calf veins including the posterior tibial, peroneal and gastrocnemius veins when visible. The superficial great saphenous vein was also interrogated. Spectral Doppler was utilized to evaluate flow at rest  and with distal augmentation maneuvers in the common femoral, femoral and popliteal veins. COMPARISON:  None. FINDINGS: RIGHT LOWER EXTREMITY Common Femoral Vein: No evidence of thrombus. Normal compressibility, respiratory phasicity and response to augmentation. Saphenofemoral Junction: No evidence of thrombus. Normal compressibility and flow on color Doppler imaging. Profunda Femoral Vein: No evidence of thrombus. Normal compressibility and flow on color Doppler imaging. Femoral Vein: No evidence of thrombus. Normal compressibility, respiratory phasicity and response to augmentation. Popliteal Vein: No evidence of thrombus. Normal compressibility, respiratory phasicity and response to augmentation. Calf Veins: No evidence of thrombus. Normal compressibility and flow on color Doppler imaging. Superficial Great Saphenous Vein: No evidence of thrombus. Normal compressibility and flow on color Doppler imaging. Venous Reflux:  None. Other Findings:  None. LEFT LOWER EXTREMITY Common Femoral Vein: No evidence of thrombus. Normal compressibility, respiratory phasicity and response to augmentation. Saphenofemoral Junction: No evidence of thrombus. Normal compressibility and flow on color Doppler imaging. Profunda Femoral Vein: No evidence of thrombus. Normal compressibility and flow on color Doppler imaging. Femoral Vein: No evidence of thrombus. Normal compressibility, respiratory phasicity and response to augmentation. Popliteal Vein: No evidence of thrombus. Normal compressibility, respiratory phasicity and response to augmentation. Calf Veins: No evidence of thrombus. Normal compressibility and flow on color Doppler imaging. Superficial Great Saphenous Vein: No evidence of thrombus. Normal compressibility and flow on color Doppler imaging. Venous Reflux:  None. Other Findings:  None. IMPRESSION: No evidence of DVT within either lower extremity. Electronically Signed   By: Sandi Mariscal M.D.   On: 05/18/2016 13:31      Medications:   . sodium chloride 50 mL/hr at 05/20/16 0600  . milrinone 0.25 mcg/kg/min (05/20/16 0600)   . aspirin  81 mg Oral Daily  . atorvastatin  40 mg Oral q1800  . carvedilol  6.25 mg Oral BID WC  . clopidogrel  75 mg Oral Daily  . heparin  5,000 Units Subcutaneous Q8H  . insulin aspart  0-5 Units Subcutaneous QHS  . insulin aspart  0-9 Units Subcutaneous TID WC  . insulin aspart  3 Units Subcutaneous TID WC  . insulin glargine  10 Units Subcutaneous QHS  . isosorbide mononitrate  30 mg Oral BID  . potassium chloride  20 mEq Oral BID  . sodium chloride flush  3 mL Intravenous Q12H  . sodium chloride flush  3 mL Intravenous Q12H     Assessment/ Plan:  [name] is a 65 y.o. white female with coronary artery disease, diabetes mellitus type II, congestive heart failure, atrial fibrillation, anemia, hypertension, hyperlipidemia  1. Acute renal failure on Chronic kidney disease stage III with proteinuria: chronic kidney disease secondary to hypertension and diabetes. Baseline creatinine of 1.6 GFR of 34 on 3/17 Acute renal failure from acute cardio-renal syndrome.  - gentle iv hydration to prevent  contrast nephopathy - continue losartan - Cr now at baseline  2. Hypertension and acute on chronic systolic congestive heart failure with EF of 20-25%. Atrial fibrillation Blood pressure at goal. - metoprolol, losartan and furosemide.  - placed on milrinone  - appreciate cards input. coronary angiography today   3. Diabetes mellitus type II with chronic kidney disease: insulin dependent.   Not well controlled. Hemoglobin A1c 11.4%  4. LE edema - left greater than right monitor   LOS: 5 Raden Byington 12/11/20179:15 AM

## 2016-05-20 NOTE — H&P (Addendum)
History and Physical  Patient ID: Theresa Rogers MRN: [id number], DOB: 05/08/1951 Admit Date: (Not on file) Date of Encounter: 05/20/2016, 4:11 PM Primary Physician: Pcp Not In System Primary Cardiologist: Dr. Fletcher Anon, MD  Chief Complaint: SOB/LE swelling Reason for Admission: NSTEMI with severe 3 vessel CAD  HPI: 65 y.o. female with h/o CAD s/p remote PCI in 2009 s/p NSTEMI with PCI/DES to mid LCx in 06/2015, ICM/chronic systolic CHF, PAF (diagnosed 06/2015), DM2, CKD stage III, HTN, HLD, and anemia who presented to [facility] on 12/4 with increased LE swelling and SOB in the setting of cardiorenal syndrome.   Previously hospitalized in 12/2092 for acute systolic CHF in the setting of NSTEMI and new onset Afib. Echo showed an EF of 30-35%. She was noted to have an elevated SCr on presentation. She ultimately underwent LHC that showed 70% stenosis in the mid LAD, 80% stenosis in the mid LCx and multiple 80% lesions in the RCA. She underwent staged revascularization after a few days with FFR guidance. LAD FFR was not significant and thus PCI was deferred. LCx PCI and DES was performed without complications. She was not started on anticoagulation given her CKD and anemia in the setting of her maintaining sinus rhythm, especially in the setting of DAPT. She did attempt to have staged PCI of the RCA in late February 2017, though this was complicated by porforation of the radial artery after attempting to advance the guiding catheter and this was able to be managed conservatively with external pressure and did not require any surgery or other procedures. At her last follow up in 08/2015 she was doing well without SOB or chest discomfort. She was seen in the ED on 11/20 with increased SOB x 1 week. She had been doubling her Lasix at home that week. She was felt to have PNA and started on doxycycline without improvement in her cough. However, shortly after improvement in her cough she began to note LE swelling.   She  was admitted to [facility] on 12/4 with bilateral LE swelling with increased SOB for the week prior. Dry weight of 135, over the past 2 weeks prior to her admission her weight had increased to 150 pounds. She has been conmpliant with her Lasix, though does not feel like this has helped her fluid status. Over the past coupled of days she had been doubling her home dose of Lasix from 40 mg daily to 40 mg bid. She drinks less than 2 L of fluids during the day and watches her salt intake. Upon her arrival to the ED she was found to have SCr 1.81 (approximate baseline), K+ 4.7, WBC 7.0, HGB 12.2, PLT 316, BNP 2790, troponin peaked at 0.56. CXR showed bilateral small pleural effusion with bilateral basilar atelectasis or infiltrate. EKG showed NSR, 91 bpm, prior infarct along inferoanterior regions. She was started on IV Lasix, though this lead to a slight increase in her renal function to 2.05. Lasix was decreased to 20 mg bid IV with reanl function maintaining at 1.91. She did not note significant UOP at only 1.3 L for the admission at that time. Echo was repeated and showed a worsening EF at 20-25% (previously 30-35%), severe global HK with likely regions of AK in the anterior, anteroseptal, and apical regions. GR1DD, mild MR, left atrium moderately dilated, PASP 54 mmHg. Case was discussed with her primary cardiologist and she was started on low-dose milrinone infusion with IV Lasix in an effort to diurese her and improve renal function for  planned cardiac cath on 12/11. With milrinone and IV Lasix she diuresed well, though measurements are not accurate as she has been voiding in the toilet. Renal function improved to 1.66. She underwent R/LHC on 12/11 that showed significant 3-vessel CAD with patent stent int he LCx. Significant porgression of mid LAD sdisease over the last 10 months and diffuse obstructive disease affecting the RCA in multiple areas. It was felt given her aggressvies diabetic disease she should be  evaluated at [facility] for possible CABG. If not felt to be a bypass candidate then PCI fo the LAD/RCA could be considered. She last took Plavix on the morning of 05/20/2016.   Past Medical History:  Diagnosis Date  . Anemia   . CAD (coronary artery disease)    a. s/p MI/syncope in 2009 s/p remote stenting; b. NSTEMI 06/2015, cardiac cath 06/2015 pLAD 70% FFR 0.88 not clinically sig, dLAD 90%, mLCx 80% s/p PCI/DES 0%, pRCA 70%, mRCA 80%, dRCA 80%; c. cath 05/2016 mLAD 95%, dLAD 90%, dLAD 90%, pRCA 70%, mRCA 90%, dRCA 80%  . Chronic systolic CHF (congestive heart failure) ([place])   . CKD (chronic kidney disease), stage III   . Diabetes mellitus without complication ([place])   . HLD (hyperlipidemia)   . HTN (hypertension)   . Ischemic cardiomyopathy    a. echo 06/2015: EF 30-35%, diffuse HK, nable to exclude RWMA, LV diastolic function parameters were normal, mild MR, left atrium was mildly dilated, PASP 28 mm Hg, IVC was dilated c/w elevated CVP; b. echo 05/2016: EF 20%, severe global HK with AK in ant, anteroseptal, apical wall, GR1DD, mild MR, mod dilated LA, RV sys fxn mod reduced, PASP 53 mmHg, triv pericardial effusion, L pleural effusion  . PAF (paroxysmal atrial fibrillation) ([place])    a. new onset 06/2015; b. CHADS2VASc = 5 (CHF, HTN, age x 1, vascular dz, female); c. not on long term full dose anticoagulation in an effort to avoid triple therapy given her anemia      Most Recent Cardiac Studies: [facility] 05/20/2016: Conclusion     Prox RCA lesion, 70 %stenosed.  Dist RCA lesion, 80 %stenosed.  Prox RCA to Mid RCA lesion, 0 %stenosed.  Mid Cx lesion, 0 %stenosed.  Dist LAD lesion, 90 %stenosed.  Mid LAD lesion, 95 %stenosed.  Prox Cx lesion, 30 %stenosed.  Mid RCA lesion, 90 %stenosed.  Hemodynamic findings consistent with moderate pulmonary hypertension.   1. Severely elevated filling pressures with an LVEDP of 33 mmHg, moderate pulmonary hypertension with a mean pressure of 40 mmHg  and mildly reduced cardiac output at 3.98 with a cardiac index of 2.26. These numbers were on milrinone infusion and multiple days of IV diuresis.  2. Significant three-vessel coronary artery disease with patent stent in the left circumflex. Significant progression of mid LAD disease over the last 10 months and diffuse obstructive disease affecting the right coronary artery in multiple areas.  Recommendations: The patient has very aggressive diabetic disease. She should be evaluated for CABG. There seems to be a graftable area in the mid LAD and distal right coronary artery. PCI of LAD/RCA can be considered if deemed not a good candidate for CABG. The patient will require few days of diuresis. I will transfer the patient to [facility] for [facility].   Echo 05/15/2016: Study Conclusions  - Left ventricle: The cavity size was mildly dilated. Systolic   function was severely reduced. The estimated ejection fraction   was in the range of 20%. Severe global hypokinesis, likely  with   regions of akinesis in the anterior, anteroseptal and apical   regions. Wall motion of basal inferior/posterior/lateral wall   best preserved though still depressed. Regional wall motion   abnormalities cannot be excluded. Doppler parameters are   consistent with abnormal left ventricular relaxation (grade 1   diastolic dysfunction). - Aortic valve: Valve area (Vmax): 1.36 cm^2. - Mitral valve: There was mild regurgitation. - Left atrium: The atrium was moderately dilated. - Right ventricle: Systolic function was moderately reduced. - Pulmonary arteries: PA peak pressure: 53 mm Hg (S). - Pericardium, extracardiac: A trivial pericardial effusion was   identified.  Impressions:  - Left pleural effusion noted.   Surgical History:  Past Surgical History:  Procedure Laterality Date  . CARDIAC CATHETERIZATION N/A 06/22/2015   Procedure: Coronary Angiogram;  Surgeon: Minna Merritts, MD;  Location: [place] [facility];  Service: Cardiovascular;  Laterality: N/A;  . CARDIAC CATHETERIZATION N/A 06/26/2015   Procedure: Coronary Stent Intervention;  Surgeon: Wellington Hampshire, MD;  Location: [place] [facility];  Service: Cardiovascular;  Laterality: N/A;  . CARDIAC CATHETERIZATION N/A 08/03/2015   Procedure: Coronary Stent Intervention;  Surgeon: Wellington Hampshire, MD;  Location: [place] [facility];  Service: Cardiovascular;  Laterality: N/A;  . CARDIAC CATHETERIZATION N/A 05/20/2016   Procedure: Right/Left Heart Cath and Coronary Angiography;  Surgeon: Wellington Hampshire, MD;  Location: [place] [facility];  Service: Cardiovascular;  Laterality: N/A;  . CORONARY ANGIOPLASTY WITH STENT PLACEMENT       Home Meds: Prior to Admission medications   Medication Sig Start Date End Date Taking? Authorizing Provider  aspirin EC 81 MG EC tablet Take 1 tablet (81 mg total) by mouth daily. 06/27/15   Hillary Bow, MD  atorvastatin (LIPITOR) 40 MG tablet Take 1 tablet (40 mg total) by mouth daily at 6 PM. 06/27/15   Hillary Bow, MD  clopidogrel (PLAVIX) 75 MG tablet Take 1 tablet (75 mg total) by mouth daily. 08/07/15   Wellington Hampshire, MD  doxycycline (VIBRAMYCIN) 100 MG capsule Take 1 capsule (100 mg total) by mouth 2 (two) times daily. Patient not taking: Reported on 05/13/2016 04/29/16   Orbie Pyo, MD  furosemide (LASIX) 40 MG tablet Take 1 tablet (40 mg total) by mouth daily. 08/04/15   Rogelia Mire, NP  guaiFENesin-codeine 100-10 MG/5ML syrup Take 5 mLs by mouth 3 (three) times daily as needed for cough. 04/29/16   Orbie Pyo, MD  insulin glargine (LANTUS) 100 UNIT/ML injection Inject 20 Units into the skin at bedtime.    Historical Provider, MD  insulin lispro (HUMALOG) 100 UNIT/ML injection Inject 5-12 Units into the skin 3 (three) times daily before meals.     Historical Provider, MD  isosorbide mononitrate (IMDUR) 30 MG 24 hr tablet Take 1 tablet (30 mg total) by  mouth 2 (two) times daily. Patient not taking: Reported on 05/13/2016 06/27/15   Hillary Bow, MD  losartan (COZAAR) 50 MG tablet Take 50 mg by mouth daily.    Historical Provider, MD  Metoprolol Tartrate 75 MG TABS Take 75 mg by mouth 2 (two) times daily. 06/27/15   Hillary Bow, MD    Allergies:  Allergies  Allergen Reactions  . Hydralazine Hcl Swelling    Social History   Social History  . Marital status: Divorced    Spouse name: N/A  . Number of children: N/A  . Years of education: N/A   Occupational History  . Not on file.  Social History Main Topics  . Smoking status: Never Smoker  . Smokeless tobacco: Never Used  . Alcohol use No  . Drug use: No  . Sexual activity: Not on file   Other Topics Concern  . Not on file   Social History Narrative  . No narrative on file     Family History  Problem Relation Age of Onset  . CAD Mother   . Alzheimer's disease Mother   . Prostate cancer Father   . Diabetes Brother   . Kidney failure Brother     Review of Systems: Review of Systems  Constitutional: Positive for malaise/fatigue. Negative for chills, diaphoresis, fever and weight loss.  HENT: Negative for congestion.   Eyes: Negative for discharge and redness.  Respiratory: Positive for shortness of breath. Negative for cough, hemoptysis, sputum production and wheezing.   Cardiovascular: Positive for leg swelling. Negative for chest pain, palpitations, orthopnea, claudication and PND.  Gastrointestinal: Negative for abdominal pain, blood in stool, heartburn, melena, nausea and vomiting.  Genitourinary: Negative for hematuria.  Musculoskeletal: Negative for falls and myalgias.  Skin: Negative for rash.  Neurological: Positive for weakness. Negative for dizziness, tingling, tremors, sensory change, speech change, focal weakness and loss of consciousness.  Endo/Heme/Allergies: Does not bruise/bleed easily.  Psychiatric/Behavioral: Negative for substance abuse. The  patient is not nervous/anxious.   All other systems reviewed and are negative.   Labs:   Lab Results  Component Value Date   WBC 6.8 05/19/2016   HGB 9.7 (L) 05/19/2016   HCT 29.5 (L) 05/19/2016   MCV 82.3 05/19/2016   PLT 293 05/19/2016    Recent Labs Lab 05/18/16 1104  05/20/16 0433  NA  --   < > 133*  K  --   < > 4.2  CL  --   < > 97*  CO2  --   < > 30  BUN  --   < > 55*  CREATININE  --   < > 1.66*  CALCIUM  --   < > 8.3*  PROT 5.9*  --   --   BILITOT 0.6  --   --   ALKPHOS 278*  --   --   ALT 29  --   --   AST 21  --   --   GLUCOSE  --   < > 301*  < > = values in this interval not displayed. No results for input(s): CKTOTAL, CKMB, TROPONINI in the last 72 hours. Lab Results  Component Value Date   CHOL 222 (H) 06/21/2015   HDL 41 06/21/2015   LDLCALC 149 (H) 06/21/2015   TRIG 162 (H) 06/21/2015   No results found for: DDIMER  Radiology/Studies:  Dg Chest 2 View  Result Date: 05/14/2016 CLINICAL DATA:  Congestive heart failure. Dilated cardiomyopathy. Productive cough. EXAM: CHEST  2 VIEW COMPARISON:  05/13/2016 FINDINGS: Improved aeration of both lungs is seen with decreased bibasilar atelectasis. Cardiomegaly and pulmonary vascular congestion are stable. Small bilateral pleural effusions show no significant change. No pneumothorax visualized. IMPRESSION: Improved aeration of both lungs with decreased bibasilar atelectasis. No significant change in small bilateral pleural effusions. Stable cardiomegaly and pulmonary vascular congestion. Electronically Signed   By: Earle Gell M.D.   On: 05/14/2016 07:12   Dg Chest 2 View  Result Date: 05/13/2016 CLINICAL DATA:  Swelling bilateral lower extremities EXAM: CHEST  2 VIEW COMPARISON:  04/29/2016 FINDINGS: Borderline cardiomegaly. Bilateral small pleural effusion with bilateral basilar atelectasis or infiltrate. Again noted linear  atelectasis, scarring or infiltrate in lingula. No convincing pulmonary edema. IMPRESSION:  Bilateral small pleural effusion with bilateral basilar atelectasis or infiltrate. Again noted linear atelectasis, scarring or infiltrate in lingula. No convincing pulmonary edema. Electronically Signed   By: Lahoma Crocker M.D.   On: 05/13/2016 10:20   Dg Chest 2 View  Result Date: 04/29/2016 CLINICAL DATA:  Cough and shortness of breath EXAM: CHEST  2 VIEW COMPARISON:  07/03/2015 FINDINGS: Small pleural effusions and streaky basilar opacities. Cardiopericardial enlargement that is similar to prior. No edema or pneumothorax. Negative upper mediastinal contours. Mild leftward deviation of the trachea, also seen previously. IMPRESSION: 1. Small pleural effusions and bibasilar atelectasis. Superimposed infection could be obscured. 2. Chronic cardiomegaly.  No pulmonary edema. 3. Mild leftward deviation of the trachea, also seen previously. Correlate with thyroid exam. Electronically Signed   By: Monte Fantasia M.D.   On: 04/29/2016 10:48   US Venous Img Lower Bilateral  Result Date: 05/18/2016 CLINICAL DATA:  Bilateral lower extremity edema. History of pulmonary embolism. Evaluate for DVT. EXAM: BILATERAL LOWER EXTREMITY VENOUS DOPPLER ULTRASOUND TECHNIQUE: Gray-scale sonography with graded compression, as well as color Doppler and duplex ultrasound were performed to evaluate the lower extremity deep venous systems from the level of the common femoral vein and including the common femoral, femoral, profunda femoral, popliteal and calf veins including the posterior tibial, peroneal and gastrocnemius veins when visible. The superficial great saphenous vein was also interrogated. Spectral Doppler was utilized to evaluate flow at rest and with distal augmentation maneuvers in the common femoral, femoral and popliteal veins. COMPARISON:  None. FINDINGS: RIGHT LOWER EXTREMITY Common Femoral Vein: No evidence of thrombus. Normal compressibility, respiratory phasicity and response to augmentation. Saphenofemoral  Junction: No evidence of thrombus. Normal compressibility and flow on color Doppler imaging. Profunda Femoral Vein: No evidence of thrombus. Normal compressibility and flow on color Doppler imaging. Femoral Vein: No evidence of thrombus. Normal compressibility, respiratory phasicity and response to augmentation. Popliteal Vein: No evidence of thrombus. Normal compressibility, respiratory phasicity and response to augmentation. Calf Veins: No evidence of thrombus. Normal compressibility and flow on color Doppler imaging. Superficial Great Saphenous Vein: No evidence of thrombus. Normal compressibility and flow on color Doppler imaging. Venous Reflux:  None. Other Findings:  None. LEFT LOWER EXTREMITY Common Femoral Vein: No evidence of thrombus. Normal compressibility, respiratory phasicity and response to augmentation. Saphenofemoral Junction: No evidence of thrombus. Normal compressibility and flow on color Doppler imaging. Profunda Femoral Vein: No evidence of thrombus. Normal compressibility and flow on color Doppler imaging. Femoral Vein: No evidence of thrombus. Normal compressibility, respiratory phasicity and response to augmentation. Popliteal Vein: No evidence of thrombus. Normal compressibility, respiratory phasicity and response to augmentation. Calf Veins: No evidence of thrombus. Normal compressibility and flow on color Doppler imaging. Superficial Great Saphenous Vein: No evidence of thrombus. Normal compressibility and flow on color Doppler imaging. Venous Reflux:  None. Other Findings:  None. IMPRESSION: No evidence of DVT within either lower extremity. Electronically Signed   By: Sandi Mariscal M.D.   On: 05/18/2016 13:31     EKG: Interpreted by me showed: sinus rhythm with poor R-wave progression in the anterior leads and no significant ST or T wave changes Telemetry: Interpreted by me showed: Normal sinus rhythm with heart rate from 60-80 bpm. No significant arrhythmias  Weights: Filed Weights    05/18/16 0441 05/19/16 0425 05/20/16 0500  Weight: 164 lb 0.4 oz (74.4 kg) 159 lb 9.8 oz (72.4 kg) 155 lb 3.3 oz (  70.4 kg)     Physical Exam: BP: 125/63 P: 74 R 20, Pulse ox 94% RA  General: Well developed, well nourished, in no acute distress. Head: Normocephalic, atraumatic, sclera non-icteric, no xanthomas, nares are without discharge.  Neck: Negative for carotid bruits. JVD elevated approximately 8 cm with positive HJR. Lungs: Clear bilaterally to auscultation without wheezes, rales, or rhonchi. Breathing is unlabored. Heart: RRR with S1 S2. No murmurs, rubs, or gallops appreciated. Abdomen: Soft, non-tender, non-distended with normoactive bowel sounds. No hepatomegaly. No rebound/guarding. No obvious abdominal masses. Msk:  Strength and tone appear normal for age. Extremities: No clubbing or cyanosis. Trace bilateral LE edema to the mid shins. Distal pedal pulses are 2+ and equal bilaterally. Neuro: Alert and oriented X 3. No focal deficit. No facial asymmetry. Moves all extremities spontaneously. Psych:  Responds to questions appropriately with a normal affect.    ASSESSMENT AND PLAN:  Principal Problem:   Acute on chronic systolic CHF (congestive heart failure) (HCC) Active Problems:   CKD (chronic kidney disease), stage III   Ischemic cardiomyopathy   CAD (coronary artery disease)   Diabetes mellitus with complication (HCC)   Pulmonary hypertension   HLD (hyperlipidemia)   HTN (hypertension)   Acute on chronic combined systolic and diastolic heart failure secondary to ischemic cardiomyopathy/cardiorenal syndrome: Echocardiogram was notable for biventricular failure and moderate pulmonary hypertension. Given decline in LV systolic function since January, there is concern for progression of CAD contributing to her declining LVEF. She underwent R/LHC on 12/11 that showed severe 3 vessel CAD as above. Severely elevated filling pressures with an LVEDP of 33 mmHg, moderate  pulmonary hypertension with a mean pressure of 40 mmHg and mildly reduced cardiac output at 3.98 with a cardiac index of 2.26. These numbers were on milrinone infusion and s/p multiple days of inpatient IV diuresis.    Given the above findings in the setting of her aggressive diabetic disease it was best felt she be evaluated at [facility] for possible cardiac bypass surgery. If she was felt to be not be a candidate for cardiac bypass surgery she should be evaluated for PCI of the LAD/RCA disease.   Continue milrinone at 0.25 mcg/kg/min and IV lasix 60 mg bid with KCl repletion  Continue Coreg 6.25 mg bid  Given underlying chronic kidney disease, consider a combination of hydralazine/Imdur. If renal function improves, we can consider an ACE inhibitor..  Coronary artery disease: No symptoms of ongoing ischemia.  Was continued on DAPT with aspirin and clopidogrel this admission. Upon undergoing cardiac cath as above and seeing she may need CABG her Plavix was held. Last dose of Plavix was AM of 05/20/2016.   Continue ASA.  Continue isosorbide mononitrate 30 mg twice a day and carvedilol 6.25 mg twice a day  Left and right heart catheterization as above with recommendations for possible CABG as [facility].   Chronic kidney disease: Renal function continues to slightly improve on milrinone infusion with IV diuresis. Continue close monitoring with diuresis.   Uncontrolled diabetes mellitus: Long-term problem likely complicating her CAD, cardiomyopathy, and renal disease.   Continued SSI.  Continue patient education regarding medication compliance.  Hyperlipidemia: Patient has declined statins in the past despite strong indications for use, CAD and diabetes mellitus. Tolerating Lipitor 40 mg this admission.      Signed, Christell Faith, PA-C [place] Pager: [phone] 05/20/2016, 4:11 PM  Patient seen and examined. H&P by Christell Faith, PA-C reviewed.  I agree with the  assessment and  plan as stated above. Case also discussed with Dr. Fletcher Anon.  65 y/o woman with CKD admitted with systolic HF. Found to have EF 20% with severe 2v CAD and severe volume overload despite milrinone support and IV diuresis. Transferred from [facility] for further management of HF and possible CABG vs high-risk 2v PCI. Will continue milrinone and IV diuresis and discuss with [facility] in am.   Shanikqua Zarzycki,MD 12:37 AM

## 2016-05-20 NOTE — Progress Notes (Signed)
Report to [facility].  Check right groin for bleeding or hematoma.  Patient will be on bedrest for 1 hours post sheath pull---out of bed at 14:35.  Bilateral pulses are 2's DP's_.

## 2016-05-20 NOTE — Progress Notes (Signed)
Previous note written by Sharen Heck was written in error on the wrong patient.

## 2016-05-20 NOTE — Discharge Summary (Signed)
[facility] [place] NAME: Theresa Rogers    MR#:  [id number]  DATE OF BIRTH:  20-Jun-1950  DATE OF ADMISSION:  05/13/2016   ADMITTING PHYSICIAN: Vaughan Basta, MD  DATE OF DISCHARGE: 05/20/2016  PRIMARY CARE PHYSICIAN: Pcp Not In System   ADMISSION DIAGNOSIS:  CHF (congestive heart failure) (HCC) [I50.9] Bilateral lower extremity edema [R60.0] Acute on chronic congestive heart failure, unspecified congestive heart failure type (HCC) [I50.9] Dyspnea, unspecified type [R06.00] DISCHARGE DIAGNOSIS:  Principal Problem:   Acute on chronic systolic CHF (congestive heart failure) (HCC) Active Problems:   Bilateral lower extremity edema   Dyspnea   Coronary artery disease of bypass graft of native heart with stable angina pectoris (HCC)   Cardiomyopathy, ischemic   Systolic dysfunction   Acute renal failure superimposed on stage 3 chronic kidney disease ([place])   Poorly controlled type 2 diabetes mellitus with complication (HCC)   Cardiorenal syndrome   Heart failure ([place])  SECONDARY DIAGNOSIS:   Past Medical History:  Diagnosis Date  . Anemia   . CAD (coronary artery disease)    a. s/p MI/syncope in 2009 s/p remote stenting; b. NSTEMI 06/2015, cardiac cath 06/2015 pLAD 70% FFR 0.88 not clinically sig, dLAD 90%, mLCx 80% s/p PCI/DES 0%, pRCA 70%, mRCA 80%, dRCA 80%; c. cath 05/2016 mLAD 95%, dLAD 90%, dLAD 90%, pRCA 70%, mRCA 90%, dRCA 80%  . Chronic systolic CHF (congestive heart failure) ([place])   . CKD (chronic kidney disease), stage III   . Diabetes mellitus without complication ([place])   . HLD (hyperlipidemia)   . HTN (hypertension)   . Ischemic cardiomyopathy    a. echo 06/2015: EF 30-35%, diffuse HK, nable to exclude RWMA, LV diastolic function parameters were normal, mild MR, left atrium was mildly dilated, PASP 28 mm Hg, IVC was dilated c/w elevated CVP; b. echo 05/2016: EF 20%, severe global HK with AK in ant, anteroseptal,  apical wall, GR1DD, mild MR, mod dilated LA, RV sys fxn mod reduced, PASP 53 mmHg, triv pericardial effusion, L pleural effusion  . PAF (paroxysmal atrial fibrillation) ([place])    a. new onset 06/2015; b. CHADS2VASc = 5 (CHF, HTN, age x 1, vascular dz, female); c. not on long term full dose anticoagulation in an effort to avoid triple therapy given her anemia    HOSPITAL COURSE:  65 year old female with past medical history of ischemic cardiomyopathy, paroxysmal A fibrillation, hypertension, hyperlipidemia, diabetes, chronic kidney disease stage III, chronic systolic CHF admitted due to shortness of breath and worsening lower extremity edema.  1. Acute on chronic systolic & diastolic CHF- secondary to ischemic cardiomyopathy/cardiorenal syndrome Echocardiogram was notable for biventricular failure and moderate pulmonary hypertension. Given decline in LV systolic function since January, there is concern for progression of CAD contributing to her declining LVEF. She underwent R/LHC on 12/11 that showed severe 3 vessel CAD. Severely elevated filling pressures, moderate pulmonary hypertension. These numbers were on milrinone infusion and s/p multiple days of inpatient IV diuresis.  - Cardio recommends transfer to [facility] for possible CABG and if not, may consider PCI of the LAD/RCA disease.   2. Acute on CKD 3 - cardiorenal in nature due to Cardiomyopathy.   3. Ischemic CM - EF has reduced to 20% from 30-35%.  S/p cath showing severe 3 vessel CAD requiring [facility]  4. Uncontrolled DM - cont. Lantus, Novolog with meals and SSI  - BS stable.    5. Hyperlipidemia -  cont. Atorvastatin.   6. CAD: asymptomatic at this time. Left and right heart catheterization as above ON 12/11 with recommendations for possible CABG as [facility]  D/W Cardio and patient. Agreeable with transfer to [facility] DISCHARGE CONDITIONS:  stable CONSULTS OBTAINED:  Treatment Team:  Wellington Hampshire,  MD Lavonia Dana, MD DRUG ALLERGIES:   Allergies  Allergen Reactions  . Hydralazine Hcl Swelling   DISCHARGE MEDICATIONS:     Medication List    STOP taking these medications   clopidogrel 75 MG tablet Commonly known as:  PLAVIX   doxycycline 100 MG capsule Commonly known as:  VIBRAMYCIN   losartan 50 MG tablet Commonly known as:  COZAAR   Metoprolol Tartrate 75 MG Tabs     TAKE these medications   aspirin 81 MG EC tablet Take 1 tablet (81 mg total) by mouth daily.   atorvastatin 40 MG tablet Commonly known as:  LIPITOR Take 1 tablet (40 mg total) by mouth daily at 6 PM.   carvedilol 6.25 MG tablet Commonly known as:  COREG Take 1 tablet (6.25 mg total) by mouth 2 (two) times daily with a meal.   furosemide 40 MG tablet Commonly known as:  LASIX Take 1.5 tablets (60 mg total) by mouth 2 (two) times daily. What changed:  how much to take  when to take this   guaiFENesin-codeine 100-10 MG/5ML syrup Take 5 mLs by mouth 3 (three) times daily as needed for cough.   insulin glargine 100 UNIT/ML injection Commonly known as:  LANTUS Inject 20 Units into the skin at bedtime.   insulin lispro 100 UNIT/ML injection Commonly known as:  HUMALOG Inject 5-12 Units into the skin 3 (three) times daily before meals.   isosorbide mononitrate 30 MG 24 hr tablet Commonly known as:  IMDUR Take 1 tablet (30 mg total) by mouth 2 (two) times daily.   milrinone 20 MG/100 ML Soln infusion Commonly known as:  PRIMACOR Inject 18.85 mcg/min into the vein continuous.   potassium chloride SA 20 MEQ tablet Commonly known as:  K-DUR,KLOR-CON Take 1 tablet (20 mEq total) by mouth 2 (two) times daily.        DISCHARGE INSTRUCTIONS:   DIET:  Cardiac diet DISCHARGE CONDITION:  Good ACTIVITY:  Activity as tolerated OXYGEN:  Home Oxygen: No.  Oxygen Delivery: room air DISCHARGE LOCATION:  [place]   If you experience worsening of your admission symptoms, develop  shortness of breath, life threatening emergency, suicidal or homicidal thoughts you must seek medical attention immediately by calling 911 or calling your MD immediately  if symptoms less severe.  You Must read complete instructions/literature along with all the possible adverse reactions/side effects for all the Medicines you take and that have been prescribed to you. Take any new Medicines after you have completely understood and accpet all the possible adverse reactions/side effects.   Please note  You were cared for by a hospitalist during your hospital stay. If you have any questions about your discharge medications or the care you received while you were in the hospital after you are discharged, you can call the unit and asked to speak with the hospitalist on call if the hospitalist that took care of you is not available. Once you are discharged, your primary care physician will handle any further medical issues. Please note that NO REFILLS for any discharge medications will be authorized once you are discharged, as it is imperative that you return to your primary care physician (or establish a relationship  with a primary care physician if you do not have one) for your aftercare needs so that they can reassess your need for medications and monitor your lab values.    On the day of Discharge:  VITAL SIGNS:  Blood pressure (!) 143/79, pulse 76, temperature 97.7 F (36.5 C), temperature source Oral, resp. rate 20, height 5\' 4"  (1.626 m), weight 70.4 kg (155 lb 3.3 oz), SpO2 93 %. PHYSICAL EXAMINATION:  GENERAL:  65 y.o.-year-old patient lying in the bed with no acute distress.  EYES: Pupils equal, round, reactive to light and accommodation. No scleral icterus. Extraocular muscles intact.  HEENT: Head atraumatic, normocephalic. Oropharynx and nasopharynx clear.  NECK:  Supple, no jugular venous distention. No thyroid enlargement, no tenderness.  LUNGS: Normal breath sounds bilaterally, no  wheezing, rales,rhonchi or crepitation. No use of accessory muscles of respiration.  CARDIOVASCULAR: S1, S2 normal. No murmurs, rubs, or gallops.  ABDOMEN: Soft, non-tender, non-distended. Bowel sounds present. No organomegaly or mass.  EXTREMITIES: No pedal edema, cyanosis, or clubbing.  NEUROLOGIC: Cranial nerves II through XII are intact. Muscle strength 5/5 in all extremities. Sensation intact. Gait not checked.  PSYCHIATRIC: The patient is alert and oriented x 3.  SKIN: No obvious rash, lesion, or ulcer.  DATA REVIEW:   CBC  Recent Labs Lab 05/19/16 0434  WBC 6.8  HGB 9.7*  HCT 29.5*  PLT 293    Chemistries   Recent Labs Lab 05/18/16 1104  05/20/16 0433  NA  --   < > 133*  K  --   < > 4.2  CL  --   < > 97*  CO2  --   < > 30  GLUCOSE  --   < > 301*  BUN  --   < > 55*  CREATININE  --   < > 1.66*  CALCIUM  --   < > 8.3*  AST 21  --   --   ALT 29  --   --   ALKPHOS 278*  --   --   BILITOT 0.6  --   --   < > = values in this interval not displayed.    Management plans discussed with the patient, family and they are in agreement.  CODE STATUS: FULL CODE   TOTAL TIME TAKING CARE OF THIS PATIENT: 45 minutes.    Max Sane M.D on 05/20/2016 at 4:46 PM  Between 7am to 6pm - Pager - [phone]  After 6pm go to [email or web address] - Proofreader  [employer] [facility]  Office  [phone]  CC: Primary care physician; Pcp Not In System Kathlyn Sacramento MD, [facility]  Note: This dictation was prepared with Dragon dictation along with smaller phrase technology. Any transcriptional errors that result from this process are unintentional.

## 2016-05-20 NOTE — Progress Notes (Signed)
Called [facility] [address] at [employer] [facility]. Nurse stated report received from [place] was satisfactory. No further questions.

## 2016-05-20 NOTE — Progress Notes (Signed)
Inpatient Diabetes Program Recommendations  AACE/ADA: New Consensus Statement on Inpatient Glycemic Control (2015)  Target Ranges:  Prepandial:   less than 140 mg/dL      Peak postprandial:   less than 180 mg/dL (1-2 hours)      Critically ill patients:  140 - 180 mg/dL   Results for Theresa Rogers, Theresa Rogers (MRN [id number]) as of 05/20/2016 07:55  Ref. Range 05/19/2016 07:20 05/19/2016 11:50 05/19/2016 16:27 05/19/2016 21:27  Glucose-Capillary Latest Ref Range: 65 - 99 mg/dL 275 (H) 189 (H) 250 (H) 278 (H)   Results for Theresa Rogers, Theresa Rogers (MRN [id number]) as of 05/20/2016 07:55  Ref. Range 05/20/2016 07:29  Glucose-Capillary Latest Ref Range: 65 - 99 mg/dL 250 (H)    Home DM Meds: Lantus 20 units QHS       Humalog 5-12 units TID  Current Insulin Orders: Lantus 10 units QHS      Novolog Sensitive Correction Scale/ SSI (0-9 units) TID AC + HS      Novolog 3 units TID        MD- Please consider the following in-hospital insulin adjustments:  1. Increase Lantus to 15 units QHS  2. Increase Novolog Meal Coverage to: Novolog 5 units TID with meals (hold if pt eats <50% of meal)     --Will follow patient during hospitalization--  Wyn Quaker RN, MSN, CDE Diabetes Coordinator Inpatient Glycemic Control Team Team Pager: [phone] (8a-5p)

## 2016-05-21 ENCOUNTER — Other Ambulatory Visit: Payer: Self-pay | Admitting: *Deleted

## 2016-05-21 DIAGNOSIS — I481 Persistent atrial fibrillation: Secondary | ICD-10-CM | POA: Diagnosis not present

## 2016-05-21 DIAGNOSIS — N179 Acute kidney failure, unspecified: Secondary | ICD-10-CM | POA: Diagnosis not present

## 2016-05-21 DIAGNOSIS — N184 Chronic kidney disease, stage 4 (severe): Secondary | ICD-10-CM | POA: Diagnosis present

## 2016-05-21 DIAGNOSIS — L03115 Cellulitis of right lower limb: Secondary | ICD-10-CM | POA: Diagnosis not present

## 2016-05-21 DIAGNOSIS — I255 Ischemic cardiomyopathy: Secondary | ICD-10-CM | POA: Diagnosis present

## 2016-05-21 DIAGNOSIS — I9581 Postprocedural hypotension: Secondary | ICD-10-CM | POA: Diagnosis not present

## 2016-05-21 DIAGNOSIS — N170 Acute kidney failure with tubular necrosis: Secondary | ICD-10-CM | POA: Diagnosis not present

## 2016-05-21 DIAGNOSIS — J90 Pleural effusion, not elsewhere classified: Secondary | ICD-10-CM | POA: Diagnosis not present

## 2016-05-21 DIAGNOSIS — I5022 Chronic systolic (congestive) heart failure: Secondary | ICD-10-CM | POA: Diagnosis not present

## 2016-05-21 DIAGNOSIS — Z951 Presence of aortocoronary bypass graft: Secondary | ICD-10-CM | POA: Diagnosis not present

## 2016-05-21 DIAGNOSIS — I214 Non-ST elevation (NSTEMI) myocardial infarction: Secondary | ICD-10-CM | POA: Diagnosis present

## 2016-05-21 DIAGNOSIS — I517 Cardiomegaly: Secondary | ICD-10-CM | POA: Diagnosis not present

## 2016-05-21 DIAGNOSIS — I272 Pulmonary hypertension, unspecified: Secondary | ICD-10-CM | POA: Diagnosis present

## 2016-05-21 DIAGNOSIS — I2510 Atherosclerotic heart disease of native coronary artery without angina pectoris: Secondary | ICD-10-CM

## 2016-05-21 DIAGNOSIS — E1129 Type 2 diabetes mellitus with other diabetic kidney complication: Secondary | ICD-10-CM | POA: Diagnosis not present

## 2016-05-21 DIAGNOSIS — E876 Hypokalemia: Secondary | ICD-10-CM | POA: Diagnosis not present

## 2016-05-21 DIAGNOSIS — L03116 Cellulitis of left lower limb: Secondary | ICD-10-CM | POA: Diagnosis not present

## 2016-05-21 DIAGNOSIS — Z6825 Body mass index (BMI) 25.0-25.9, adult: Secondary | ICD-10-CM | POA: Diagnosis not present

## 2016-05-21 DIAGNOSIS — D62 Acute posthemorrhagic anemia: Secondary | ICD-10-CM | POA: Diagnosis not present

## 2016-05-21 DIAGNOSIS — I480 Paroxysmal atrial fibrillation: Secondary | ICD-10-CM | POA: Diagnosis not present

## 2016-05-21 DIAGNOSIS — E785 Hyperlipidemia, unspecified: Secondary | ICD-10-CM | POA: Diagnosis present

## 2016-05-21 DIAGNOSIS — I5023 Acute on chronic systolic (congestive) heart failure: Secondary | ICD-10-CM | POA: Diagnosis not present

## 2016-05-21 DIAGNOSIS — Z01810 Encounter for preprocedural cardiovascular examination: Secondary | ICD-10-CM | POA: Diagnosis not present

## 2016-05-21 DIAGNOSIS — J449 Chronic obstructive pulmonary disease, unspecified: Secondary | ICD-10-CM | POA: Diagnosis present

## 2016-05-21 DIAGNOSIS — I259 Chronic ischemic heart disease, unspecified: Secondary | ICD-10-CM | POA: Diagnosis not present

## 2016-05-21 DIAGNOSIS — J189 Pneumonia, unspecified organism: Secondary | ICD-10-CM | POA: Diagnosis not present

## 2016-05-21 DIAGNOSIS — E875 Hyperkalemia: Secondary | ICD-10-CM | POA: Diagnosis present

## 2016-05-21 DIAGNOSIS — E871 Hypo-osmolality and hyponatremia: Secondary | ICD-10-CM | POA: Diagnosis not present

## 2016-05-21 DIAGNOSIS — E1122 Type 2 diabetes mellitus with diabetic chronic kidney disease: Secondary | ICD-10-CM | POA: Diagnosis not present

## 2016-05-21 DIAGNOSIS — E1165 Type 2 diabetes mellitus with hyperglycemia: Secondary | ICD-10-CM | POA: Diagnosis present

## 2016-05-21 DIAGNOSIS — I130 Hypertensive heart and chronic kidney disease with heart failure and stage 1 through stage 4 chronic kidney disease, or unspecified chronic kidney disease: Secondary | ICD-10-CM | POA: Diagnosis not present

## 2016-05-21 DIAGNOSIS — N183 Chronic kidney disease, stage 3 (moderate): Secondary | ICD-10-CM | POA: Diagnosis not present

## 2016-05-21 DIAGNOSIS — E11649 Type 2 diabetes mellitus with hypoglycemia without coma: Secondary | ICD-10-CM | POA: Diagnosis not present

## 2016-05-21 DIAGNOSIS — I5043 Acute on chronic combined systolic (congestive) and diastolic (congestive) heart failure: Secondary | ICD-10-CM | POA: Diagnosis not present

## 2016-05-21 DIAGNOSIS — I5082 Biventricular heart failure: Secondary | ICD-10-CM | POA: Diagnosis present

## 2016-05-21 LAB — URINALYSIS, ROUTINE W REFLEX MICROSCOPIC
Bacteria, UA: NONE SEEN
Bilirubin Urine: NEGATIVE
Glucose, UA: 150 mg/dL — AB
Hgb urine dipstick: NEGATIVE
Ketones, ur: NEGATIVE mg/dL
Leukocytes, UA: NEGATIVE
Nitrite: NEGATIVE
Protein, ur: 100 mg/dL — AB
Specific Gravity, Urine: 1.011 (ref 1.005–1.030)
pH: 5 (ref 5.0–8.0)

## 2016-05-21 LAB — BASIC METABOLIC PANEL
Anion gap: 6 (ref 5–15)
BUN: 46 mg/dL — AB (ref 6–20)
CALCIUM: 8.6 mg/dL — AB (ref 8.9–10.3)
CHLORIDE: 96 mmol/L — AB (ref 101–111)
CO2: 31 mmol/L (ref 22–32)
CREATININE: 1.87 mg/dL — AB (ref 0.44–1.00)
GFR calc non Af Amer: 27 mL/min — ABNORMAL LOW (ref >60)
GFR, EST AFRICAN AMERICAN: 32 mL/min — AB (ref >60)
GLUCOSE: 427 mg/dL — AB (ref 65–99)
Potassium: 5.2 mmol/L — ABNORMAL HIGH (ref 3.5–5.1)
Sodium: 133 mmol/L — ABNORMAL LOW (ref 135–145)

## 2016-05-21 LAB — CBC
HCT: 30 % — ABNORMAL LOW (ref 36.0–46.0)
Hemoglobin: 9.5 g/dL — ABNORMAL LOW (ref 12.0–15.0)
MCH: 26.7 pg (ref 26.0–34.0)
MCHC: 31.7 g/dL (ref 30.0–36.0)
MCV: 84.3 fL (ref 78.0–100.0)
PLATELETS: 286 10*3/uL (ref 150–400)
RBC: 3.56 MIL/uL — AB (ref 3.87–5.11)
RDW: 15.1 % (ref 11.5–15.5)
WBC: 5.8 10*3/uL (ref 4.0–10.5)

## 2016-05-21 LAB — PLATELET INHIBITION P2Y12: Platelet Function  P2Y12: 109 [PRU] — ABNORMAL LOW (ref 194–418)

## 2016-05-21 LAB — GLUCOSE, CAPILLARY
GLUCOSE-CAPILLARY: 319 mg/dL — AB (ref 65–99)
Glucose-Capillary: 433 mg/dL — ABNORMAL HIGH (ref 65–99)
Glucose-Capillary: 220 mg/dL — ABNORMAL HIGH (ref 65–99)
Glucose-Capillary: 306 mg/dL — ABNORMAL HIGH (ref 65–99)

## 2016-05-21 LAB — MRSA PCR SCREENING: MRSA by PCR: NEGATIVE

## 2016-05-21 MED ORDER — INSULIN GLARGINE 100 UNIT/ML ~~LOC~~ SOLN
10.0000 [IU] | Freq: Once | SUBCUTANEOUS | Status: AC
  Administered 2016-05-21: 10 [IU] via SUBCUTANEOUS
  Filled 2016-05-21: qty 0.1

## 2016-05-21 MED ORDER — INSULIN GLARGINE 100 UNIT/ML ~~LOC~~ SOLN
15.0000 [IU] | Freq: Two times a day (BID) | SUBCUTANEOUS | Status: DC
  Administered 2016-05-21 – 2016-05-26 (×9): 15 [IU] via SUBCUTANEOUS
  Filled 2016-05-21 (×11): qty 0.15

## 2016-05-21 MED ORDER — FUROSEMIDE 10 MG/ML IJ SOLN
80.0000 mg | Freq: Two times a day (BID) | INTRAMUSCULAR | Status: DC
  Administered 2016-05-21 – 2016-05-26 (×10): 80 mg via INTRAVENOUS
  Filled 2016-05-21 (×10): qty 8

## 2016-05-21 MED ORDER — SODIUM CHLORIDE 0.9% FLUSH
10.0000 mL | INTRAVENOUS | Status: DC | PRN
  Administered 2016-05-24: 10 mL
  Filled 2016-05-21: qty 40

## 2016-05-21 NOTE — Progress Notes (Signed)
Peripherally Inserted Central Catheter/Midline Placement  The IV Nurse has discussed with the patient and/or persons authorized to consent for the patient, the purpose of this procedure and the potential benefits and risks involved with this procedure.  The benefits include less needle sticks, lab draws from the catheter, and the patient may be discharged home with the catheter. Risks include, but not limited to, infection, bleeding, blood clot (thrombus formation), and puncture of an artery; nerve damage and irregular heartbeat and possibility to perform a PICC exchange if needed/ordered by physician.  Alternatives to this procedure were also discussed.  Bard Power PICC patient education guide, fact sheet on infection prevention and patient information card has been provided to patient /or left at bedside.    PICC/Midline Placement Documentation  PICC Double Lumen 75/30/05 PICC Right Basilic 39 cm 0 cm (Active)  Indication for Insertion or Continuance of Line Vasoactive infusions 05/21/2016 10:35 PM  Exposed Catheter (cm) 0 cm 05/21/2016 10:35 PM  Site Assessment Clean;Dry;Intact 05/21/2016 10:35 PM  Lumen #1 Status Blood return noted;Flushed;Saline locked 05/21/2016 10:35 PM  Lumen #2 Status Blood return noted;Flushed;Saline locked 05/21/2016 10:35 PM  Dressing Type Transparent 05/21/2016 10:35 PM  Dressing Status Clean;Dry;Intact;Antimicrobial disc in place 05/21/2016 10:35 PM  Dressing Intervention New dressing 05/21/2016 10:35 PM  Dressing Change Due 05/28/16 05/21/2016 10:35 PM       Aldona Lento L 05/21/2016, 10:52 PM

## 2016-05-21 NOTE — Progress Notes (Signed)
Darrick Grinder, NP notified of need for Renal MD to verify PICC placement. Advised her that this RN spoke with Dr. Joelyn Oms who gave verbal approval but is not officially consulting on case. Amy advised this RN that she was speaking with Dr. Tempie Hoist who will write clarifying orders to have PICC placed.

## 2016-05-21 NOTE — Consult Note (Signed)
[address]       [place] [address]             [phone]        Theresa Rogers [place] Medical Record #[id number] Date of Birth: [birth year]  Referring: Dr. Glori Bickers Primary Care: Pcp Not In System  Chief Complaint:   Edema and shortness of breath  History of Present Illness:     Patient examined, coronary angiogram and 2-D echocardiogram personally reviewed and counseled with patient. I have discussed the patient with the patient's cardiologist for coordination of care and plan for surgical revascularization probably next week  Very nice 65 year old Caucasian female with severe diabetic coronary artery disease, previous PCI, ischemic artery myopathy with class IV heart failure, and decline in functional status which led to her admission to [facility] 1 week ago.  Cardiac catheterization demonstrated patent PCI to the circumflex but progression of disease with high-grade stenoses in her RCA and LAD circulation. LVEDP was 35 mmHg, cardiac index was preserved at 2.2. She was placed on milrinone and underwent aggressive diuresis with improved symptoms and decrease in swelling. She was transferred to this hospital for further support and for evaluation for surgical coronary revascularization. The patient has chronic renal failure and her creatinine is being carefully monitored after her dye load yesterday. The patient has been on long-term Plavix for a year and her Plavix was stopped yesterday.  The patient is right-hand dominant and had her right radial artery used for previous catheterization with apparent injury to the radial artery not requiring surgical repair and the patient currently has a good right radial pulse.]  The patient blood sugars are poorly controlled with CABG 300-400. Hemoglobin A1c is pending.  Echocardiogram showed biventricular dysfunction but no significant valvular abnormality.  She has had some intermittent  atrial fibrillation with admissions for advanced heart failure but has never been on long-term and a correlation with Coumadin or  Eliquis . She is currently in sinus rhythm.   Current Activity/ Functional Status: Patient is retired and lives alone and takes care of her grandchildren age 19 and twins age 68-1/2   Zubrod Score: At the time of surgery this patient's most appropriate activity status/level should be described as: []     0    Normal activity, no symptoms []     1    Restricted in physical strenuous activity but ambulatory, able to do out light work [x]     2    Ambulatory and capable of self care, unable to do work activities, up and about                 more than 50%  Of the time                            []     3    Only limited self care, in bed greater than 50% of waking hours []     4    Completely disabled, no self care, confined to bed or chair []     5    Moribund  Past Medical History:  Diagnosis Date  . Anemia   . CAD (coronary artery disease)    a. s/p MI/syncope in 2009 s/p remote stenting; b. NSTEMI 06/2015, cardiac cath 06/2015 pLAD 70% FFR 0.88 not clinically sig, dLAD 90%, mLCx 80% s/p PCI/DES 0%, pRCA 70%, mRCA 80%, dRCA 80%; c. cath 05/2016 mLAD  95%, dLAD 90%, dLAD 90%, pRCA 70%, mRCA 90%, dRCA 80%  . Chronic systolic CHF (congestive heart failure) ([place])   . CKD (chronic kidney disease), stage III   . Diabetes mellitus without complication ([place])   . HLD (hyperlipidemia)   . HTN (hypertension)   . Ischemic cardiomyopathy    a. echo 06/2015: EF 30-35%, diffuse HK, nable to exclude RWMA, LV diastolic function parameters were normal, mild MR, left atrium was mildly dilated, PASP 28 mm Hg, IVC was dilated c/w elevated CVP; b. echo 05/2016: EF 20%, severe global HK with AK in ant, anteroseptal, apical wall, GR1DD, mild MR, mod dilated LA, RV sys fxn mod reduced, PASP 53 mmHg, triv pericardial effusion, L pleural effusion  . PAF (paroxysmal atrial fibrillation) ([place])    a.  new onset 06/2015; b. CHADS2VASc = 5 (CHF, HTN, age x 1, vascular dz, female); c. not on long term full dose anticoagulation in an effort to avoid triple therapy given her anemia     Past Surgical History:  Procedure Laterality Date  . CARDIAC CATHETERIZATION N/A 06/22/2015   Procedure: Coronary Angiogram;  Surgeon: Minna Merritts, MD;  Location: [place] [facility];  Service: Cardiovascular;  Laterality: N/A;  . CARDIAC CATHETERIZATION N/A 06/26/2015   Procedure: Coronary Stent Intervention;  Surgeon: Wellington Hampshire, MD;  Location: [place] [facility];  Service: Cardiovascular;  Laterality: N/A;  . CARDIAC CATHETERIZATION N/A 08/03/2015   Procedure: Coronary Stent Intervention;  Surgeon: Wellington Hampshire, MD;  Location: [place] [facility];  Service: Cardiovascular;  Laterality: N/A;  . CARDIAC CATHETERIZATION N/A 05/20/2016   Procedure: Right/Left Heart Cath and Coronary Angiography;  Surgeon: Wellington Hampshire, MD;  Location: [place] [facility];  Service: Cardiovascular;  Laterality: N/A;  . CORONARY ANGIOPLASTY WITH STENT PLACEMENT      History  Smoking Status  . Never Smoker  Smokeless Tobacco  . Never Used    History  Alcohol Use No    Social History   Social History  . Marital status: Divorced    Spouse name: N/A  . Number of children: N/A  . Years of education: N/A   Occupational History  . Not on file.   Social History Main Topics  . Smoking status: Never Smoker  . Smokeless tobacco: Never Used  . Alcohol use No  . Drug use: No  . Sexual activity: Not on file   Other Topics Concern  . Not on file   Social History Narrative  . No narrative on file    Allergies  Allergen Reactions  . Hydralazine Hcl Swelling    Current Facility-Administered Medications  Medication Dose Route Frequency Provider Last Rate Last Dose  . 0.9 %  sodium chloride infusion   Intravenous Continuous Rogelia Mire, NP 10 mL/hr at 05/21/16 1100    . acetaminophen  (TYLENOL) tablet 650 mg  650 mg Oral Q4H PRN Bhavinkumar Bhagat, PA      . aspirin EC tablet 81 mg  81 mg Oral Daily Bhavinkumar Bhagat, PA   81 mg at 05/21/16 0845  . atorvastatin (LIPITOR) tablet 40 mg  40 mg Oral q1800 Bhavinkumar Bhagat, PA      . carvedilol (COREG) tablet 6.25 mg  6.25 mg Oral BID WC Bhavinkumar Bhagat, PA   6.25 mg at 05/21/16 0845  . furosemide (LASIX) injection 80 mg  80 mg Intravenous BID Amy D Clegg, NP      . heparin injection 5,000 Units  5,000 Units Subcutaneous  Q8H Bhavinkumar Bhagat, PA   5,000 Units at 05/20/16 2300  . insulin aspart (novoLOG) injection 0-9 Units  0-9 Units Subcutaneous TID WC Bhavinkumar Bhagat, PA   9 Units at 05/21/16 0845  . insulin glargine (LANTUS) injection 15 Units  15 Units Subcutaneous BID Ivin Poot, MD      . isosorbide mononitrate (IMDUR) 24 hr tablet 30 mg  30 mg Oral BID Bhavinkumar Bhagat, PA   30 mg at 05/21/16 0845  . milrinone (PRIMACOR) 20 MG/100 ML (0.2 mg/mL) infusion  0.25 mcg/kg/min Intravenous Continuous Bhavinkumar Bhagat, PA 5.3 mL/hr at 05/21/16 1100 0.25 mcg/kg/min at 05/21/16 1100  . nitroGLYCERIN (NITROSTAT) SL tablet 0.4 mg  0.4 mg Sublingual Q5 Min x 3 PRN Bhavinkumar Bhagat, PA      . ondansetron (ZOFRAN) injection 4 mg  4 mg Intravenous Q6H PRN Bhavinkumar Bhagat, PA        Prescriptions Prior to Admission  Medication Sig Dispense Refill Last Dose  . aspirin EC 81 MG EC tablet Take 1 tablet (81 mg total) by mouth daily.   05/20/2016 at Unknown time  . atorvastatin (LIPITOR) 40 MG tablet Take 1 tablet (40 mg total) by mouth daily at 6 PM. 30 tablet 0 05/20/2016 at Unknown time  . carvedilol (COREG) 6.25 MG tablet Take 1 tablet (6.25 mg total) by mouth 2 (two) times daily with a meal. 60 tablet 0 05/20/2016 at 0700  . clopidogrel (PLAVIX) 75 MG tablet Take 75 mg by mouth daily.   05/20/2016 at 0700  . furosemide (LASIX) 40 MG tablet Take 1.5 tablets (60 mg total) by mouth 2 (two) times daily. 60 tablet 0 Past  Week at Unknown time  . guaiFENesin-codeine 100-10 MG/5ML syrup Take 5 mLs by mouth 3 (three) times daily as needed for cough. 60 mL 0 Past Week at Unknown time  . insulin glargine (LANTUS) 100 UNIT/ML injection Inject 15 Units into the skin at bedtime.    05/19/2016 at Unknown time  . insulin lispro (HUMALOG) 100 UNIT/ML injection Inject 5-12 Units into the skin 3 (three) times daily before meals. Per sliding scale   05/20/2016 at Unknown time  . isosorbide mononitrate (IMDUR) 30 MG 24 hr tablet Take 1 tablet (30 mg total) by mouth 2 (two) times daily. 60 tablet 0 05/20/2016 at Unknown time  . potassium chloride SA (K-DUR,KLOR-CON) 20 MEQ tablet Take 1 tablet (20 mEq total) by mouth 2 (two) times daily. 60 tablet 0 05/20/2016 at Unknown time    Family History  Problem Relation Age of Onset  . CAD Mother   . Alzheimer's disease Mother   . Prostate cancer Father   . Diabetes Brother   . Kidney failure Brother      Review of Systems:       Cardiac Review of Systems: Y or N  Chest Pain [  No  ]  Resting SOB [no   ] Exertional SOB  [place]  ]  Orthopnea [ yes ]   Pedal Edema [place]   ]    Palpitations [place]  ] Syncope  [ no ]   Presyncope [ no  ] positive for abdominal swelling  General Review of Systems: [Y] = yes [  ]=no Constitional: recent weight change [  ]; anorexia [  ]; fatigue [  ]; nausea [  ]; night sweats [  ]; fever [  ]; or chills [  ]  Dental: poor dentition[ 2 broken teeth  ]; Last Dentist visit: One year   Eye : blurred vision [  ]; diplopia [   ]; vision changes [  ];  Amaurosis fugax[  ]; Resp: cough [  ];  wheezing[  ];  hemoptysis[  ]; shortness of breath[ yes  ]; paroxysmal nocturnal dyspnea[  ]; dyspnea on exertion[ yes ]; or orthopnea[ yes ];  GI:  gallstones[  ], vomiting[  ];  dysphagia[  ]; melena[  ];  hematochezia [  ]; heartburn[ yes  ];   Hx of  Colonoscopy[  ]; GU: kidney stones [  ]; hematuria[  ];    dysuria [  ];  nocturia[  ];  history of     obstruction [  ]; urinary frequency [  ]             Skin: rash, swelling[  ];, hair loss[  ];  peripheral edema[  ];  or itching[  ]; Musculosketetal: myalgias[  ];  joint swelling[  ];  joint erythema[  ];  joint pain[  ];  back pain[  ];  Heme/Lymph: bruising[  ];  bleeding[  ];  anemia[  ];  Neuro: TIA[  ];  headaches[  ];  stroke[  ];  vertigo[  ];  seizures[  ];   paresthesias[  ];  difficulty walking[  ];  Psych:depression[  ]; anxiety[  ];  Endocrine: diabetes[ yes ];  thyroid dysfunction[  ];  Immunizations: Flu [  ]; Pneumococcal[  ];  Other: Right-hand dominant, no history of thoracic trauma or pneumothorax  Physical Exam: BP 126/68   Pulse 73   Temp 97.6 F (36.4 C)   Resp 18   Ht 5\' 4"  (1.626 m)   Wt 151 lb 4.8 oz (68.6 kg)   SpO2 95%   BMI 25.97 kg/m        Physical Exam  General: Alert and responsive very nice middle-aged female no acute distress who appears chronically ill HEENT: Normocephalic pupils equal , dentition adequate Neck: Supple without JVD, adenopathy, or bruit Chest: Clear to auscultation, symmetrical breath sounds, no rhonchi, no tenderness             or deformity Cardiovascular: Regular rate and rhythm, no murmur, + S3 gallop, peripheral pulses             palpable in all extremities Abdomen:  Soft, nontender, no palpable mass or organomegaly Extremities: Warm, well-perfused, no clubbing cyanosis edema or tenderness, varicose veins in the right leg              no venous stasis changes of the legs Rectal/GU: Deferred Neuro: Grossly non--focal and symmetrical throughout Skin: Clean and dry without rash or ulceration   Diagnostic Studies & Laboratory data:     Recent Radiology Findings:   Chest x-ray taken at [place] shows bilateral mild pleural effusions, repeat pending   I have independently reviewed the above radiologic studies.  Recent Lab Findings: Lab Results  Component Value  Date   WBC 5.8 05/21/2016   HGB 9.5 (L) 05/21/2016   HCT 30.0 (L) 05/21/2016   PLT 286 05/21/2016   GLUCOSE 427 (H) 05/21/2016   CHOL 222 (H) 06/21/2015   TRIG 162 (H) 06/21/2015   HDL 41 06/21/2015   LDLCALC 149 (H) 06/21/2015   ALT 29 05/18/2016   AST 21 05/18/2016   NA 133 (L) 05/21/2016   K 5.2 (H) 05/21/2016   CL 96 (L)  05/21/2016   CREATININE 1.87 (H) 05/21/2016   BUN 46 (H) 05/21/2016   CO2 31 05/21/2016   TSH 8.859 (H) 06/19/2015   INR 1.01 05/20/2016   HGBA1C 11.3 (H) 05/15/2016      Assessment / Plan:     Ischemic cardiomyopathy class IV CHF Severe three-vessel coronary disease in a diabetic pattern with previous PCI Chronic renal insufficiency History of intermittent atrial fibrillation  Patient has severe coronary disease suboptimal targets with poor biventricular function. Her best long-term therapy would be surgical coronary revascularization but this would be at increased risk due to her severe LV dysfunction, renal failure, poorly controlled diabetes. We'll follow patient and review results of myocardial viability study. We'll obtain pre-CABG Dopplers PFTs and optimize her insulin regimen to better  control blood sugars     I    @ME1 @ 05/21/2016 1:05 PM

## 2016-05-21 NOTE — Progress Notes (Signed)
Advanced Heart Failure Rounding Note   Subjective:   Transferred from [facility] with volume overload despite IV diuresis + milrinone.   Denies SOB/PND/CP  RHC/LHC LVEP 33 CO 3.98/2.26  Prox RCA lesion, 70 %stenosed.  Dist RCA lesion, 80 %stenosed.  Prox RCA to Mid RCA lesion, 0 %stenosed.  Mid Cx lesion, 0 %stenosed.  Dist LAD lesion, 90 %stenosed.  Mid LAD lesion, 95 %stenosed.  Prox Cx lesion, 30 %stenosed.  Mid RCA lesion, 90 %stenosed.  Objective:   Weight Range:  Vital Signs:   Temp:  [97.7 F (36.5 C)-98.7 F (37.1 C)] 98.5 F (36.9 C) (12/12 0700) Pulse Rate:  [73-84] 79 (12/12 0900) Resp:  [11-24] 24 (12/12 0900) BP: (119-162)/(59-98) 147/86 (12/12 0900) SpO2:  [91 %-98 %] 91 % (12/12 0900) Weight:  [151 lb 4.8 oz (68.6 kg)-151 lb 7.3 oz (68.7 kg)] 151 lb 4.8 oz (68.6 kg) (12/12 0300) Last BM Date: 05/19/16  Weight change: Filed Weights   05/20/16 2025 05/21/16 0300  Weight: 151 lb 7.3 oz (68.7 kg) 151 lb 4.8 oz (68.6 kg)    Intake/Output:   Intake/Output Summary (Last 24 hours) at 05/21/16 1017 Last data filed at 05/21/16 0900  Gross per 24 hour  Intake           360.45 ml  Output             1425 ml  Net         -1064.55 ml     Physical Exam: General:  Well appearing. No resp difficulty. In bed  HEENT: normal Neck: supple. JVP to jaw  . Carotids 2+ bilat; no bruits. No lymphadenopathy or thryomegaly appreciated. Cor: PMI nondisplaced. Regular rate & rhythm. No rubs, gallops or murmurs. Lungs: clear Abdomen: soft, nontender, nondistended. No hepatosplenomegaly. No bruits or masses. Good bowel sounds. Extremities: no cyanosis, clubbing, rash, R and LLE 1+ edema Neuro: alert & orientedx3, cranial nerves grossly intact. moves all 4 extremities w/o difficulty. Affect pleasant  Telemetry: NSR 70s   Labs: Basic Metabolic Panel:  Recent Labs Lab 05/17/16 0318 05/18/16 0524 05/19/16 0434 05/20/16 0433 05/20/16 2005 05/21/16 0320  NA  136 135 132* 133*  --  133*  K 3.8 4.6 4.4 4.2  --  5.2*  CL 99* 98* 96* 97*  --  96*  CO2 31 31 31 30   --  31  GLUCOSE 136* 249* 309* 301*  --  427*  BUN 61* 57* 57* 55*  --  46*  CREATININE 1.75* 1.81* 1.76* 1.66* 1.53* 1.87*  CALCIUM 8.1* 8.5* 8.3* 8.3*  --  8.6*  PHOS  --   --   --  3.4  --   --     Liver Function Tests:  Recent Labs Lab 05/18/16 1104 05/20/16 0433  AST 21  --   ALT 29  --   ALKPHOS 278*  --   BILITOT 0.6  --   PROT 5.9*  --   ALBUMIN 2.7* 2.5*   No results for input(s): LIPASE, AMYLASE in the last 168 hours. No results for input(s): AMMONIA in the last 168 hours.  CBC:  Recent Labs Lab 05/19/16 0434 05/20/16 2005 05/21/16 0320  WBC 6.8 7.3 5.8  HGB 9.7* 9.9* 9.5*  HCT 29.5* 30.9* 30.0*  MCV 82.3 83.1 84.3  PLT 293 306 286    Cardiac Enzymes: No results for input(s): CKTOTAL, CKMB, CKMBINDEX, TROPONINI in the last 168 hours.  BNP: BNP (last 3 results)  Recent Labs  06/19/15 0923 05/13/16 0905  BNP 1,782.0* 2,790.0*    ProBNP (last 3 results) No results for input(s): PROBNP in the last 8760 hours.    Other results:  Imaging:  No results found.   Medications:     Scheduled Medications: . aspirin EC  81 mg Oral Daily  . atorvastatin  40 mg Oral q1800  . carvedilol  6.25 mg Oral BID WC  . furosemide  60 mg Intravenous BID  . heparin  5,000 Units Subcutaneous Q8H  . insulin aspart  0-9 Units Subcutaneous TID WC  . insulin glargine  15 Units Subcutaneous QHS  . isosorbide mononitrate  30 mg Oral BID  . potassium chloride SA  20 mEq Oral BID     Infusions: . sodium chloride 10 mL/hr at 05/21/16 0900  . milrinone 0.25 mcg/kg/min (05/21/16 0900)     PRN Medications:  acetaminophen, nitroGLYCERIN, ondansetron (ZOFRAN) IV   Assessment:   1. A/C Systolic Heart Failure  2. CAD-2 vessel diseased LAD RCA 3. CKD 4.Uncontrolled DM- Hgb AC 11.3  5. Hyperkalemia    Plan/Discussion:   Admitted with volume overload  and severe 2vCAD. Transferred from [facility] for further management of HF and possible CABG vs high-risk 2v PCI.  ECHO Ef 20-25%.   Volume overloaded. Increase lasix to 80 mg twice a day. Stop potassium. Continue carvedilol 6.25 mg twice a day. No hydralazine with allergy. Continue imdur 30 mg twice a day. Follow renal function closely. Creatinine up from 1.5>1.8. ? CIN. No spiro or dig     Place PICC for CVP/CO-OX   Dr Darcey Nora following.    Length of Stay: 1   Amy Clegg NP-C  05/21/2016, 10:17 AM  Advanced Heart Failure Team Pager [phone] (M-F; 7a - 4p)  Please contact [facility] for night-coverage after hours (4p -7a ) and weekends on [email or web address]  Patient seen and examined with Darrick Grinder, NP. We discussed all aspects of the encounter. I agree with the assessment and plan as stated above.   65 y/o woman with CKD admitted with systolic HF. Found to have EF 20% with severe 2v CAD and severe volume overload despite milrinone support and IV diuresis. Transferred from [facility] for further management of HF and possible CABG vs high-risk 2v PCI. I have reviewed echo and cardiac cath films with Dr. Prescott Gum. Her targets are marginal but probably acceptable particularly in LAD territory. Will continue milrinone and IV diuresis. If renal function improves will get cMRI. Will place PICC to follow CVPs and co-ox.   Hold off on ACE/ARB and b-blocker. Possible CABG Monday.  Shakira Los,MD 4:32 PM

## 2016-05-21 NOTE — Progress Notes (Signed)
Chaplain was making his rounds and visited with pt in room IC-4. Provided the ministry of prayer.    05/20/16 1140  Clinical Encounter Type  Visited With Patient  Visit Type Initial;Spiritual support  Referral From Nurse  Spiritual Encounters  Spiritual Needs Prayer

## 2016-05-21 NOTE — Progress Notes (Signed)
CARDIAC REHAB PHASE I   PRE:  Rate/Rhythm: 73 SR  BP:  Supine:   Sitting: 136/95  Standing:    SaO2: 95%RA  MODE:  Ambulation: 350 ft   POST:  Rate/Rhythm: 75 SR  BP:  Supine:   Sitting: 124/61  Standing:    SaO2: 94%RA 1335-1445 Pt walked 350 ft on RA with steady gait with minimal asst. No DOE. Able to talk and walk. Gave pt OHS booklet and care guide. Wrote down how to view pre op video. Discussed purpose of mobility and IS. Does not have IS yet. Discussed sternal precautions. Back to bed after walk. Discussed with daughters need for pt to have someone with her after surgery 24/7 first week.   Graylon Good, RN BSN  05/21/2016 2:40 PM

## 2016-05-21 NOTE — Progress Notes (Signed)
Spoke with IV team regarding ETA for PICC placement. Advised that she will advise the night shift RN to work her in that there are emergent cases pending.

## 2016-05-21 NOTE — Progress Notes (Signed)
Inpatient Diabetes Program Recommendations  AACE/ADA: New Consensus Statement on Inpatient Glycemic Control (2015)  Target Ranges:  Prepandial:   less than 140 mg/dL      Peak postprandial:   less than 180 mg/dL (1-2 hours)      Critically ill patients:  140 - 180 mg/dL   Lab Results  Component Value Date   GLUCAP 433 (H) 05/21/2016   HGBA1C 11.3 (H) 05/15/2016    Review of Glycemic Control Results for Theresa Rogers, Theresa Rogers (MRN [id number]) as of 05/21/2016 09:59  Ref. Range 05/20/2016 07:29 05/20/2016 11:56 05/20/2016 16:17 05/20/2016 21:35 05/21/2016 08:06  Glucose-Capillary Latest Ref Range: 65 - 99 mg/dL 250 (H) 233 (H) 269 (H) 303 (H) 433 (H)   Outpatient Diabetes medications: Lantus 20 units + Humalog 5-12 units tid Current orders for Inpatient glycemic control: Lantus 15 units + Novolog correction 0-9 units tid  Inpatient Diabetes Program Recommendations:  Noted elevated CBGs.  Please consider: -Increase Lantus to 20 units -Add Novolog 5 units tid meal coverage if eats 50%  Thank you, Bethena Roys E. Kishana Battey, RN, MSN, CDE Inpatient Glycemic Control Team Team Pager [phone] (8am-5pm) 05/21/2016 10:02 AM

## 2016-05-22 ENCOUNTER — Inpatient Hospital Stay (HOSPITAL_COMMUNITY): Payer: Medicaid Other

## 2016-05-22 DIAGNOSIS — Z01810 Encounter for preprocedural cardiovascular examination: Secondary | ICD-10-CM

## 2016-05-22 LAB — BASIC METABOLIC PANEL
Anion gap: 9 (ref 5–15)
BUN: 46 mg/dL — AB (ref 6–20)
CALCIUM: 8.6 mg/dL — AB (ref 8.9–10.3)
CO2: 30 mmol/L (ref 22–32)
CREATININE: 1.78 mg/dL — AB (ref 0.44–1.00)
Chloride: 98 mmol/L — ABNORMAL LOW (ref 101–111)
GFR calc non Af Amer: 29 mL/min — ABNORMAL LOW (ref >60)
GFR, EST AFRICAN AMERICAN: 34 mL/min — AB (ref >60)
Glucose, Bld: 214 mg/dL — ABNORMAL HIGH (ref 65–99)
Potassium: 3.9 mmol/L (ref 3.5–5.1)
SODIUM: 137 mmol/L (ref 135–145)

## 2016-05-22 LAB — PROTIME-INR
INR: 0.91
PROTHROMBIN TIME: 12.3 s (ref 11.4–15.2)

## 2016-05-22 LAB — BLOOD GAS, ARTERIAL
Acid-Base Excess: 6.4 mmol/L — ABNORMAL HIGH (ref 0.0–2.0)
Bicarbonate: 30.5 mmol/L — ABNORMAL HIGH (ref 20.0–28.0)
Drawn by: 398981
O2 Content: 1.5 L/min
O2 Saturation: 96 %
Patient temperature: 98.6
pCO2 arterial: 44.8 mmHg (ref 32.0–48.0)
pH, Arterial: 7.448 (ref 7.350–7.450)
pO2, Arterial: 80.4 mmHg — ABNORMAL LOW (ref 83.0–108.0)

## 2016-05-22 LAB — COOXEMETRY PANEL
Carboxyhemoglobin: 1.9 % — ABNORMAL HIGH (ref 0.5–1.5)
Methemoglobin: 0.9 % (ref 0.0–1.5)
O2 SAT: 68.4 %
Total hemoglobin: 9.5 g/dL — ABNORMAL LOW (ref 12.0–16.0)

## 2016-05-22 LAB — GLUCOSE, CAPILLARY
GLUCOSE-CAPILLARY: 284 mg/dL — AB (ref 65–99)
GLUCOSE-CAPILLARY: 245 mg/dL — AB (ref 65–99)
Glucose-Capillary: 137 mg/dL — ABNORMAL HIGH (ref 65–99)

## 2016-05-22 LAB — T4, FREE: FREE T4: 0.95 ng/dL (ref 0.61–1.12)

## 2016-05-22 LAB — TSH: TSH: 8.764 u[IU]/mL — ABNORMAL HIGH (ref 0.350–4.500)

## 2016-05-22 LAB — PREALBUMIN: Prealbumin: 13.1 mg/dL — ABNORMAL LOW (ref 18–38)

## 2016-05-22 MED ORDER — INSULIN ASPART 100 UNIT/ML ~~LOC~~ SOLN
0.0000 [IU] | Freq: Every day | SUBCUTANEOUS | Status: DC
  Administered 2016-05-23 – 2016-05-26 (×2): 2 [IU] via SUBCUTANEOUS

## 2016-05-22 MED ORDER — INSULIN ASPART 100 UNIT/ML ~~LOC~~ SOLN
3.0000 [IU] | Freq: Three times a day (TID) | SUBCUTANEOUS | Status: DC
  Administered 2016-05-22 – 2016-05-26 (×8): 3 [IU] via SUBCUTANEOUS

## 2016-05-22 MED ORDER — MILRINONE LACTATE IN DEXTROSE 20-5 MG/100ML-% IV SOLN
0.2500 ug/kg/min | INTRAVENOUS | Status: DC
  Administered 2016-05-22 – 2016-05-26 (×6): 0.25 ug/kg/min via INTRAVENOUS
  Filled 2016-05-22 (×6): qty 100

## 2016-05-22 MED ORDER — POTASSIUM CHLORIDE CRYS ER 20 MEQ PO TBCR
20.0000 meq | EXTENDED_RELEASE_TABLET | Freq: Once | ORAL | Status: AC
  Administered 2016-05-22: 20 meq via ORAL
  Filled 2016-05-22: qty 1

## 2016-05-22 NOTE — Progress Notes (Signed)
Pre-op Cardiac Surgery  Carotid Findings:  Findings suggest 1-39% internal carotid artery stenosis bilaterally. Vertebral arteries are patent with antegrade flow.  Upper Extremity Right Left  Brachial Pressures Triphasic 147-Triphasic  Radial Waveforms Triphasic Triphasic  Ulnar Waveforms Triphasic Triphasic  Palmar Arch (Allen's Test) Signal decreases <50% with radial compression, is unaffected with ulnar compression. Signal is unaffected with radial compression, obliterates with ulnar compression.    Lower  Extremity Right Left  Dorsalis Pedis Triphasic Triphasic  Posterior Tibial Triphasic Triphasic   05/22/2016 4:20 PM Maudry Mayhew, BS, RVT, RDCS, RDMS

## 2016-05-22 NOTE — Progress Notes (Signed)
CARDIAC REHAB PHASE I   PRE:  Rate/Rhythm: 73 SR  BP:  Supine:   Sitting:   Standing: 115/60   SaO2: 95%RA  MODE:  Ambulation: 740 ft   POST:  Rate/Rhythm: 77 SR  BP:  Supine:   Sitting: 144/74  Standing:    SaO2: 95%RA 1100-1125 Up in room. Walked 740 ft with steady gait. No CP. Did c/o leg a little tight from heparin injection and a little sore. To bed after walk. Tolerated well.   Graylon Good, RN BSN  05/22/2016 11:21 AM

## 2016-05-22 NOTE — Progress Notes (Signed)
Advanced Heart Failure Rounding Note   Subjective:    Transferred from [facility] with volume overload despite IV diuresis + milrinone. Continues to diurese with IV lasix. CVP down to 10.  Denies SOB/PND/CP  Creatinine 1.5>1.8   RHC/LHC 05/20/2016  LVEP 33 CO 3.98/2.26  Prox RCA lesion, 70 %stenosed.  Dist RCA lesion, 80 %stenosed.  Prox RCA to Mid RCA lesion, 0 %stenosed.  Mid Cx lesion, 0 %stenosed.  Dist LAD lesion, 90 %stenosed.  Mid LAD lesion, 95 %stenosed.  Prox Cx lesion, 30 %stenosed.  Mid RCA lesion, 90 %stenosed.  Objective:   Weight Range:  Vital Signs:   Temp:  [97.6 F (36.4 C)-98.9 F (37.2 C)] 98.2 F (36.8 C) (12/13 0400) Pulse Rate:  [70-80] 70 (12/13 0700) Resp:  [13-24] 15 (12/13 0700) BP: (117-152)/(56-115) 149/73 (12/13 0700) SpO2:  [91 %-98 %] 94 % (12/13 0700) Weight:  [150 lb 12.7 oz (68.4 kg)] 150 lb 12.7 oz (68.4 kg) (12/13 0500) Last BM Date: 05/19/16  Weight change: Filed Weights   05/20/16 2025 05/21/16 0300 05/22/16 0500  Weight: 151 lb 7.3 oz (68.7 kg) 151 lb 4.8 oz (68.6 kg) 150 lb 12.7 oz (68.4 kg)    Intake/Output:   Intake/Output Summary (Last 24 hours) at 05/22/16 7564 Last data filed at 05/22/16 0600  Gross per 24 hour  Intake           1071.9 ml  Output             1070 ml  Net              1.9 ml     Physical Exam: CVP 10 General:  Well appearing. No resp difficulty. In bed  HEENT: normal Neck: supple. JVP~10  . Carotids 2+ bilat; no bruits. No lymphadenopathy or thryomegaly appreciated. Cor: PMI nondisplaced. Regular rate & rhythm. No rubs, gallops or murmurs. Lungs: clear Abdomen: soft, nontender, nondistended. No hepatosplenomegaly. No bruits or masses. Good bowel sounds. Extremities: no cyanosis, clubbing, rash, R and LLE 1+ edema Neuro: alert & orientedx3, cranial nerves grossly intact. moves all 4 extremities w/o difficulty. Affect pleasant  Telemetry: NSR 70s   Labs: Basic Metabolic  Panel:  Recent Labs Lab 05/17/16 0318 05/18/16 0524 05/19/16 0434 05/20/16 0433 05/20/16 2005 05/21/16 0320  NA 136 135 132* 133*  --  133*  K 3.8 4.6 4.4 4.2  --  5.2*  CL 99* 98* 96* 97*  --  96*  CO2 31 31 31 30   --  31  GLUCOSE 136* 249* 309* 301*  --  427*  BUN 61* 57* 57* 55*  --  46*  CREATININE 1.75* 1.81* 1.76* 1.66* 1.53* 1.87*  CALCIUM 8.1* 8.5* 8.3* 8.3*  --  8.6*  PHOS  --   --   --  3.4  --   --     Liver Function Tests:  Recent Labs Lab 05/18/16 1104 05/20/16 0433  AST 21  --   ALT 29  --   ALKPHOS 278*  --   BILITOT 0.6  --   PROT 5.9*  --   ALBUMIN 2.7* 2.5*   No results for input(s): LIPASE, AMYLASE in the last 168 hours. No results for input(s): AMMONIA in the last 168 hours.  CBC:  Recent Labs Lab 05/19/16 0434 05/20/16 2005 05/21/16 0320  WBC 6.8 7.3 5.8  HGB 9.7* 9.9* 9.5*  HCT 29.5* 30.9* 30.0*  MCV 82.3 83.1 84.3  PLT 293 306 286  Cardiac Enzymes: No results for input(s): CKTOTAL, CKMB, CKMBINDEX, TROPONINI in the last 168 hours.  BNP: BNP (last 3 results)  Recent Labs  06/19/15 0923 05/13/16 0905  BNP 1,782.0* 2,790.0*    ProBNP (last 3 results) No results for input(s): PROBNP in the last 8760 hours.    Other results:  Imaging: Dg Chest 2 View  Result Date: 05/22/2016 CLINICAL DATA:  65 year old female with CHF and shortness of breath EXAM: CHEST  2 VIEW COMPARISON:  Chest radiograph dated 05/14/2016 FINDINGS: There has been interval placement of a right-sided PICC with tip at the cavoatrial junction. Interval improvement in aeration of the lungs with decreased bibasilar airspace densities. Small bilateral pleural effusions with no significant interval change. No pneumothorax. There is cardiomegaly similar to the prior exam. Osteopenia with degenerative changes of the spine. No acute fracture. IMPRESSION: Interval placement of a right-sided PICC with tip at the cavoatrial junction. Overall improvement of the  aeration of the lungs with decreased bibasilar atelectasis. Small bilateral pleural effusions. Stable cardiomegaly. Electronically Signed   By: Anner Crete M.D.   On: 05/22/2016 05:23     Medications:     Scheduled Medications: . aspirin EC  81 mg Oral Daily  . atorvastatin  40 mg Oral q1800  . carvedilol  6.25 mg Oral BID WC  . furosemide  80 mg Intravenous BID  . heparin  5,000 Units Subcutaneous Q8H  . insulin aspart  0-9 Units Subcutaneous TID WC  . insulin glargine  15 Units Subcutaneous BID  . isosorbide mononitrate  30 mg Oral BID    Infusions: . sodium chloride 10 mL/hr at 05/21/16 2000  . milrinone 0.25 mcg/kg/min (05/21/16 2000)    PRN Medications: acetaminophen, nitroGLYCERIN, ondansetron (ZOFRAN) IV, sodium chloride flush   Assessment:   1. A/C Systolic Heart Failure  2. CAD-2 vessel diseased LAD RCA 3. CKD 4.Uncontrolled DM- Hgb AC 11.3  5. Hyperkalemia  6. Elevated TSH   Plan/Discussion:   Admitted with volume overload and severe 2vCAD. Transferred from [facility] for further management of HF and possible CABG vs high-risk 2v PCI.  ECHO Ef 20-25%.   Volume improving. CVP 10. Continue lasix to 80 mg twice a day. Stop potassium. Continue carvedilol 6.25 mg twice a day. No hydralazine with allergy. Continue imdur 30 mg twice a day. BMET and CO-OX pending.  Creatinine up from 1.5>1.8>. ? CIN. No spiro or dig with elevated renal function.     Hgb A1C 11.3. Lantus adjusted. Continue sliding scale.    Dr Darcey Nora following. Plan for CABG Monday   Length of Stay: 2   Amy Clegg NP-C  05/22/2016, 7:12 AM  Advanced Heart Failure Team Pager [phone] (M-F; 7a - 4p)  Please contact [facility] for night-coverage after hours (4p -7a ) and weekends on [email or web address]  Patient seen and examined with Darrick Grinder, NP. We discussed all aspects of the encounter. I agree with the assessment and plan as stated above.   CVP remains elevated. Co-ox improved. Will continue  milrinone and IV diuresis. Diabetes coordinator to see. Can go to SDU. Probable CABG Monday. Will get cMRI if creatinine <= 1.5  Dave Mannes,MD 10:15 AM

## 2016-05-23 ENCOUNTER — Inpatient Hospital Stay (HOSPITAL_COMMUNITY): Payer: Medicaid Other

## 2016-05-23 LAB — GLUCOSE, CAPILLARY
GLUCOSE-CAPILLARY: 173 mg/dL — AB (ref 65–99)
GLUCOSE-CAPILLARY: 98 mg/dL (ref 65–99)
Glucose-Capillary: 143 mg/dL — ABNORMAL HIGH (ref 65–99)
Glucose-Capillary: 131 mg/dL — ABNORMAL HIGH (ref 65–99)
Glucose-Capillary: 102 mg/dL — ABNORMAL HIGH (ref 65–99)
Glucose-Capillary: 152 mg/dL — ABNORMAL HIGH (ref 65–99)
Glucose-Capillary: 238 mg/dL — ABNORMAL HIGH (ref 65–99)

## 2016-05-23 LAB — COOXEMETRY PANEL
Carboxyhemoglobin: 1.1 % (ref 0.5–1.5)
Methemoglobin: 1.2 % (ref 0.0–1.5)
O2 Saturation: 58.3 %
Total hemoglobin: 9.6 g/dL — ABNORMAL LOW (ref 12.0–16.0)

## 2016-05-23 LAB — CBC
HCT: 28.6 % — ABNORMAL LOW (ref 36.0–46.0)
Hemoglobin: 9 g/dL — ABNORMAL LOW (ref 12.0–15.0)
MCH: 26.1 pg (ref 26.0–34.0)
MCHC: 31.5 g/dL (ref 30.0–36.0)
MCV: 82.9 fL (ref 78.0–100.0)
Platelets: 284 10*3/uL (ref 150–400)
RBC: 3.45 MIL/uL — AB (ref 3.87–5.11)
RDW: 14.8 % (ref 11.5–15.5)
WBC: 7.2 10*3/uL (ref 4.0–10.5)

## 2016-05-23 LAB — SPIROMETRY WITH GRAPH
FEF 25-75 Post: 0.81 L/sec
FEF 25-75 Pre: 1.43 L/sec
FEF2575-%Change-Post: -43 %
FEF2575-%Pred-Post: 37 %
FEF2575-%Pred-Pre: 67 %
FEV1-%Change-Post: -10 %
FEV1-%Pred-Post: 48 %
FEV1-%Pred-Pre: 53 %
FEV1-Post: 1.17 L
FEV1-Pre: 1.3 L
FEV1FVC-%Change-Post: 4 %
FEV1FVC-%Pred-Pre: 106 %
FEV6-%Change-Post: -13 %
FEV6-%Pred-Post: 44 %
FEV6-%Pred-Pre: 52 %
FEV6-Post: 1.37 L
FEV6-Pre: 1.59 L
FEV6FVC-%Pred-Post: 104 %
FEV6FVC-%Pred-Pre: 104 %
FVC-%Change-Post: -13 %
FVC-%Pred-Post: 43 %
FVC-%Pred-Pre: 50 %
FVC-Post: 1.37 L
FVC-Pre: 1.59 L
Post FEV1/FVC ratio: 85 %
Post FEV6/FVC ratio: 100 %
Pre FEV1/FVC ratio: 82 %
Pre FEV6/FVC Ratio: 100 %

## 2016-05-23 LAB — VAS US DOPPLER PRE CABG
LEFT ECA DIAS: -12 cm/s
LEFT VERTEBRAL DIAS: 24 cm/s
Left CCA dist dias: 13 cm/s
Left CCA dist sys: 82 cm/s
Left CCA prox dias: 10 cm/s
Left CCA prox sys: 76 cm/s
Left ICA dist dias: -17 cm/s
Left ICA dist sys: -53 cm/s
Left ICA prox dias: -17 cm/s
Left ICA prox sys: -93 cm/s
RIGHT ECA DIAS: 11 cm/s
RIGHT VERTEBRAL DIAS: 17 cm/s
Right CCA prox dias: 13 cm/s
Right CCA prox sys: 69 cm/s
Right cca dist sys: -79 cm/s

## 2016-05-23 LAB — BASIC METABOLIC PANEL
Anion gap: 7 (ref 5–15)
BUN: 45 mg/dL — AB (ref 6–20)
CALCIUM: 8.4 mg/dL — AB (ref 8.9–10.3)
CO2: 33 mmol/L — AB (ref 22–32)
CREATININE: 1.71 mg/dL — AB (ref 0.44–1.00)
Chloride: 97 mmol/L — ABNORMAL LOW (ref 101–111)
GFR calc Af Amer: 35 mL/min — ABNORMAL LOW (ref >60)
GFR calc non Af Amer: 30 mL/min — ABNORMAL LOW (ref >60)
Glucose, Bld: 131 mg/dL — ABNORMAL HIGH (ref 65–99)
Potassium: 3.7 mmol/L (ref 3.5–5.1)
Sodium: 137 mmol/L (ref 135–145)

## 2016-05-23 LAB — HEMOGLOBIN A1C
Hgb A1c MFr Bld: 11.2 % — ABNORMAL HIGH (ref 4.8–5.6)
Mean Plasma Glucose: 275 mg/dL

## 2016-05-23 MED ORDER — POTASSIUM CHLORIDE CRYS ER 20 MEQ PO TBCR
40.0000 meq | EXTENDED_RELEASE_TABLET | Freq: Once | ORAL | Status: AC
  Administered 2016-05-23: 40 meq via ORAL
  Filled 2016-05-23: qty 2

## 2016-05-23 MED ORDER — SPIRONOLACTONE 25 MG PO TABS
12.5000 mg | ORAL_TABLET | Freq: Every day | ORAL | Status: DC
  Administered 2016-05-23 – 2016-05-25 (×3): 12.5 mg via ORAL
  Filled 2016-05-23 (×3): qty 1

## 2016-05-23 MED ORDER — ALBUTEROL SULFATE (2.5 MG/3ML) 0.083% IN NEBU
2.5000 mg | INHALATION_SOLUTION | Freq: Once | RESPIRATORY_TRACT | Status: AC
  Administered 2016-05-23: 2.5 mg via RESPIRATORY_TRACT

## 2016-05-23 MED ORDER — HYDRALAZINE HCL 25 MG PO TABS
12.5000 mg | ORAL_TABLET | Freq: Three times a day (TID) | ORAL | Status: DC
  Administered 2016-05-23 – 2016-05-27 (×12): 12.5 mg via ORAL
  Filled 2016-05-23 (×12): qty 1

## 2016-05-23 NOTE — Progress Notes (Signed)
CARDIAC REHAB PHASE I   PRE:  Rate/Rhythm: 72 SR    BP: sitting 126/62    SaO2: 94 RA  MODE:  Ambulation: 940 ft   POST:  Rate/Rhythm: 77 SR    BP: sitting 129/58     SaO2: 91 RA  Tolerated well, no c/o. Encouraged more walking independently. [place], ACSM 05/23/2016 12:06 PM

## 2016-05-23 NOTE — Progress Notes (Signed)
Advanced Heart Failure Rounding Note   Subjective:    Transferred from [facility] with volume overload despite IV diuresis + milrinone. Continues to diurese with IV lasix. CVP down to 9. Co-ox 58% on milrinone 0.62mcg/kg/min  Denies SOB/PND/CP  PFTs FEV1 1.30 (53%) FVC 1.59  Creatinine 1.5>1.8>1.7    RHC/LHC 05/20/2016  LVEP 33 CO 3.98/2.26  Prox RCA lesion, 70 %stenosed.  Dist RCA lesion, 80 %stenosed.  Prox RCA to Mid RCA lesion, 0 %stenosed.  Mid Cx lesion, 0 %stenosed.  Dist LAD lesion, 90 %stenosed.  Mid LAD lesion, 95 %stenosed.  Prox Cx lesion, 30 %stenosed.  Mid RCA lesion, 90 %stenosed.  Objective:   Weight Range:  Vital Signs:   Temp:  [98 F (36.7 C)-99 F (37.2 C)] 98.5 F (36.9 C) (12/14 0733) Pulse Rate:  [71-76] 74 (12/14 0425) Resp:  [16-25] 18 (12/14 0733) BP: (115-152)/(60-76) 132/75 (12/14 0733) SpO2:  [93 %-98 %] 93 % (12/14 0733) Weight:  [149 lb 14.4 oz (68 kg)] 149 lb 14.4 oz (68 kg) (12/14 0100) Last BM Date: 05/19/16  Weight change: Filed Weights   05/21/16 0300 05/22/16 0500 05/23/16 0100  Weight: 151 lb 4.8 oz (68.6 kg) 150 lb 12.7 oz (68.4 kg) 149 lb 14.4 oz (68 kg)    Intake/Output:   Intake/Output Summary (Last 24 hours) at 05/23/16 0858 Last data filed at 05/23/16 0700  Gross per 24 hour  Intake           1142.3 ml  Output             3300 ml  Net          -2157.7 ml     Physical Exam: CVP 9  General:  Well appearing. No resp difficulty. In bed  HEENT: normal Neck: supple. JVP~9-10  . Carotids 2+ bilat; no bruits. No lymphadenopathy or thryomegaly appreciated. Cor: PMI nondisplaced. Regular rate & rhythm. No rubs, gallops or murmurs. Lungs: clear Abdomen: soft, nontender, nondistended. No hepatosplenomegaly. No bruits or masses. Good bowel sounds. Extremities: no cyanosis, clubbing, rash, R and LLE 1+ edema Neuro: alert & orientedx3, cranial nerves grossly intact. moves all 4 extremities w/o difficulty. Affect  pleasant  Telemetry: NSR 70s   Labs: Basic Metabolic Panel:  Recent Labs Lab 05/19/16 0434 05/20/16 0433 05/20/16 2005 05/21/16 0320 05/22/16 0634 05/23/16 0328  NA 132* 133*  --  133* 137 137  K 4.4 4.2  --  5.2* 3.9 3.7  CL 96* 97*  --  96* 98* 97*  CO2 31 30  --  31 30 33*  GLUCOSE 309* 301*  --  427* 214* 131*  BUN 57* 55*  --  46* 46* 45*  CREATININE 1.76* 1.66* 1.53* 1.87* 1.78* 1.71*  CALCIUM 8.3* 8.3*  --  8.6* 8.6* 8.4*  PHOS  --  3.4  --   --   --   --     Liver Function Tests:  Recent Labs Lab 05/18/16 1104 05/20/16 0433  AST 21  --   ALT 29  --   ALKPHOS 278*  --   BILITOT 0.6  --   PROT 5.9*  --   ALBUMIN 2.7* 2.5*   No results for input(s): LIPASE, AMYLASE in the last 168 hours. No results for input(s): AMMONIA in the last 168 hours.  CBC:  Recent Labs Lab 05/19/16 0434 05/20/16 2005 05/21/16 0320 05/23/16 0328  WBC 6.8 7.3 5.8 7.2  HGB 9.7* 9.9* 9.5* 9.0*  HCT 29.5* 30.9*  30.0* 28.6*  MCV 82.3 83.1 84.3 82.9  PLT 293 306 286 284    Cardiac Enzymes: No results for input(s): CKTOTAL, CKMB, CKMBINDEX, TROPONINI in the last 168 hours.  BNP: BNP (last 3 results)  Recent Labs  06/19/15 0923 05/13/16 0905  BNP 1,782.0* 2,790.0*    ProBNP (last 3 results) No results for input(s): PROBNP in the last 8760 hours.    Other results:  Imaging: Dg Chest 2 View  Result Date: 05/22/2016 CLINICAL DATA:  65 year old female with CHF and shortness of breath EXAM: CHEST  2 VIEW COMPARISON:  Chest radiograph dated 05/14/2016 FINDINGS: There has been interval placement of a right-sided PICC with tip at the cavoatrial junction. Interval improvement in aeration of the lungs with decreased bibasilar airspace densities. Small bilateral pleural effusions with no significant interval change. No pneumothorax. There is cardiomegaly similar to the prior exam. Osteopenia with degenerative changes of the spine. No acute fracture. IMPRESSION: Interval  placement of a right-sided PICC with tip at the cavoatrial junction. Overall improvement of the aeration of the lungs with decreased bibasilar atelectasis. Small bilateral pleural effusions. Stable cardiomegaly. Electronically Signed   By: Anner Crete M.D.   On: 05/22/2016 05:23     Medications:     Scheduled Medications: . aspirin EC  81 mg Oral Daily  . atorvastatin  40 mg Oral q1800  . carvedilol  6.25 mg Oral BID WC  . furosemide  80 mg Intravenous BID  . heparin  5,000 Units Subcutaneous Q8H  . insulin aspart  0-5 Units Subcutaneous QHS  . insulin aspart  0-9 Units Subcutaneous TID WC  . insulin aspart  3 Units Subcutaneous TID WC  . insulin glargine  15 Units Subcutaneous BID  . isosorbide mononitrate  30 mg Oral BID    Infusions: . milrinone 0.25 mcg/kg/min (05/23/16 0538)    PRN Medications: acetaminophen, nitroGLYCERIN, ondansetron (ZOFRAN) IV, sodium chloride flush   Assessment:   1. A/C Systolic Heart Failure  2. CAD-severe 2 vessel disease LAD RCA 3. CKD 4.Uncontrolled DM- Hgb AC 11.3  5. Hyperkalemia  6. Elevated TSH 7. COPD   Plan/Discussion:    Admitted with volume overload and severe 2vCAD. Transferred from [facility] for further management of HF and possible CABG vs high-risk 2v PCI.  ECHO Ef 20-25%.   Todays CO-OX is 58%. Continue milrinone 0.25 mcg. Volume improving. CVP 9. Continue lasix to 80 mg twice a day. Continue carvedilol 6.25 mg twice a day. No hydralazine with allergy. Continue imdur 30 mg twice a day. Creatinine coming down a little 1.8>1.7. No spiro or dig with elevated renal function.   Add ted hose.   Hgb A1C 11.3. Lantus adjusted. Continue sliding scale.   Would like to get CMRI but creatinine 1.7. Need creatinine less than or = 1.5.    Dr Darcey Nora following. Plan for CABG Monday. Cardiac rehab following.    Length of Stay: 3   Amy Clegg NP-C  05/23/2016, 8:58 AM  Advanced Heart Failure Team Pager [phone] (M-F; 7a - 4p)    Please contact [facility] for night-coverage after hours (4p -7a ) and weekends on [email or web address]  Patient seen and examined with Darrick Grinder, NP. We discussed all aspects of the encounter. I agree with the assessment and plan as stated above.   Volume status improving. Co-ox marginal on milrinone but tolerating low-dose carvedilol. No ACE/ARB due to CKD currently would like to use on d/c. Would like to use hydralazine but has allergy of "swelling" -  will investigate further. K is down. Will use low-dose spiro carefully. PFTs reviewed personally. Continue CR. CABG Monday.   Erik Nessel,MD 10:00 AM

## 2016-05-24 ENCOUNTER — Ambulatory Visit: Payer: Medicaid Other | Admitting: Family

## 2016-05-24 ENCOUNTER — Encounter (HOSPITAL_COMMUNITY): Payer: Self-pay | Admitting: *Deleted

## 2016-05-24 LAB — BASIC METABOLIC PANEL
Anion gap: 7 (ref 5–15)
BUN: 43 mg/dL — AB (ref 6–20)
CHLORIDE: 98 mmol/L — AB (ref 101–111)
CO2: 30 mmol/L (ref 22–32)
Calcium: 8.3 mg/dL — ABNORMAL LOW (ref 8.9–10.3)
Creatinine, Ser: 1.68 mg/dL — ABNORMAL HIGH (ref 0.44–1.00)
GFR calc Af Amer: 36 mL/min — ABNORMAL LOW (ref >60)
GFR calc non Af Amer: 31 mL/min — ABNORMAL LOW (ref >60)
GLUCOSE: 144 mg/dL — AB (ref 65–99)
POTASSIUM: 3.6 mmol/L (ref 3.5–5.1)
SODIUM: 135 mmol/L (ref 135–145)

## 2016-05-24 LAB — PLATELET INHIBITION P2Y12: Platelet Function  P2Y12: 186 [PRU] — ABNORMAL LOW (ref 194–418)

## 2016-05-24 LAB — COOXEMETRY PANEL
Carboxyhemoglobin: 1.2 % (ref 0.5–1.5)
Methemoglobin: 1.2 % (ref 0.0–1.5)
O2 Saturation: 65.3 %
Total hemoglobin: 8.7 g/dL — ABNORMAL LOW (ref 12.0–16.0)

## 2016-05-24 LAB — CBC
HEMATOCRIT: 28.3 % — AB (ref 36.0–46.0)
HEMOGLOBIN: 8.8 g/dL — AB (ref 12.0–15.0)
MCH: 26.2 pg (ref 26.0–34.0)
MCHC: 31.1 g/dL (ref 30.0–36.0)
MCV: 84.2 fL (ref 78.0–100.0)
Platelets: 288 10*3/uL (ref 150–400)
RBC: 3.36 MIL/uL — ABNORMAL LOW (ref 3.87–5.11)
RDW: 15.2 % (ref 11.5–15.5)
WBC: 7.5 10*3/uL (ref 4.0–10.5)

## 2016-05-24 LAB — SURGICAL PCR SCREEN
MRSA, PCR: NEGATIVE
Staphylococcus aureus: NEGATIVE

## 2016-05-24 LAB — GLUCOSE, CAPILLARY
GLUCOSE-CAPILLARY: 97 mg/dL (ref 65–99)
GLUCOSE-CAPILLARY: 282 mg/dL — AB (ref 65–99)
GLUCOSE-CAPILLARY: 199 mg/dL — AB (ref 65–99)
Glucose-Capillary: 56 mg/dL — ABNORMAL LOW (ref 65–99)

## 2016-05-24 LAB — T3, FREE: T3 FREE: 2.1 pg/mL (ref 2.0–4.4)

## 2016-05-24 NOTE — Progress Notes (Signed)
CARDIAC REHAB PHASE I   PRE:  Rate/Rhythm: 77 SR  BP:  Sitting: 125/86        SaO2: 95 RA  MODE:  Ambulation: 970 ft   POST:  Rate/Rhythm: 87 SR  BP:  Sitting: 127/73         SaO2: 97 RA  Pt states she has made 8 laps around the unit today independently. Pt agreeable to walk, states she is bored. Pt ambulated 970 ft on RA, IV (which she pushed), steady gait, tolerated well . Pt c/o mild DOE (was talking entire time), denies any other complaints, declined rest stop. Pt to bed per pt request after walk, call bell within reach. Will follow.   [place], RN, BSN 05/24/2016 2:53 PM

## 2016-05-24 NOTE — Progress Notes (Signed)
Advanced Heart Failure Rounding Note   Subjective:    Transferred from [facility] with volume overload despite IV diuresis + milrinone. Continues to diurese with IV lasix. CVP down to 11 Co-ox 65% on milrinone 0.44mcg/kg/min  Denies SOB/PND/CP  PFTs FEV1 1.30 (53%) FVC 1.59  Creatinine 1.5>1.8>1.7>1.68   RHC/LHC 05/20/2016  LVEP 33 CO 3.98/2.26  Prox RCA lesion, 70 %stenosed.  Dist RCA lesion, 80 %stenosed.  Prox RCA to Mid RCA lesion, 0 %stenosed.  Mid Cx lesion, 0 %stenosed.  Dist LAD lesion, 90 %stenosed.  Mid LAD lesion, 95 %stenosed.  Prox Cx lesion, 30 %stenosed.  Mid RCA lesion, 90 %stenosed.  Objective:   Weight Range:  Vital Signs:   Temp:  [97.9 F (36.6 C)-99.1 F (37.3 C)] 97.9 F (36.6 C) (12/15 0806) Pulse Rate:  [71-86] 76 (12/15 0420) Resp:  [18] 18 (12/15 0806) BP: (124-134)/(60-71) 124/60 (12/15 0806) SpO2:  [90 %-95 %] 90 % (12/15 0806) Weight:  [147 lb 9.6 oz (67 kg)] 147 lb 9.6 oz (67 kg) (12/15 0420) Last BM Date: 05/19/16  Weight change: Filed Weights   05/22/16 0500 05/23/16 0100 05/24/16 0420  Weight: 150 lb 12.7 oz (68.4 kg) 149 lb 14.4 oz (68 kg) 147 lb 9.6 oz (67 kg)    Intake/Output:   Intake/Output Summary (Last 24 hours) at 05/24/16 1036 Last data filed at 05/24/16 1000  Gross per 24 hour  Intake            367.5 ml  Output                0 ml  Net            367.5 ml     Physical Exam: CVP 10 General:  Well appearing. No resp difficulty. Sitting on the side of the bed. HEENT: normal Neck: supple. JVP~9-10  . Carotids 2+ bilat; no bruits. No lymphadenopathy or thryomegaly appreciated. Cor: PMI nondisplaced. Regular rate & rhythm. No rubs, gallops or murmurs. Lungs: clear Abdomen: soft, nontender, nondistended. No hepatosplenomegaly. No bruits or masses. Good bowel sounds. Extremities: no cyanosis, clubbing, rash, R and LLE trace-1+edema Neuro: alert & orientedx3, cranial nerves grossly intact. moves all 4  extremities w/o difficulty. Affect pleasant  Telemetry: NSR 70s   Labs: Basic Metabolic Panel:  Recent Labs Lab 05/20/16 0433 05/20/16 2005 05/21/16 0320 05/22/16 0634 05/23/16 0328 05/24/16 0430  NA 133*  --  133* 137 137 135  K 4.2  --  5.2* 3.9 3.7 3.6  CL 97*  --  96* 98* 97* 98*  CO2 30  --  31 30 33* 30  GLUCOSE 301*  --  427* 214* 131* 144*  BUN 55*  --  46* 46* 45* 43*  CREATININE 1.66* 1.53* 1.87* 1.78* 1.71* 1.68*  CALCIUM 8.3*  --  8.6* 8.6* 8.4* 8.3*  PHOS 3.4  --   --   --   --   --     Liver Function Tests:  Recent Labs Lab 05/18/16 1104 05/20/16 0433  AST 21  --   ALT 29  --   ALKPHOS 278*  --   BILITOT 0.6  --   PROT 5.9*  --   ALBUMIN 2.7* 2.5*   No results for input(s): LIPASE, AMYLASE in the last 168 hours. No results for input(s): AMMONIA in the last 168 hours.  CBC:  Recent Labs Lab 05/19/16 0434 05/20/16 2005 05/21/16 0320 05/23/16 0328 05/24/16 0430  WBC 6.8 7.3 5.8 7.2 7.5  HGB 9.7* 9.9* 9.5* 9.0* 8.8*  HCT 29.5* 30.9* 30.0* 28.6* 28.3*  MCV 82.3 83.1 84.3 82.9 84.2  PLT 293 306 286 284 288    Cardiac Enzymes: No results for input(s): CKTOTAL, CKMB, CKMBINDEX, TROPONINI in the last 168 hours.  BNP: BNP (last 3 results)  Recent Labs  06/19/15 0923 05/13/16 0905  BNP 1,782.0* 2,790.0*    ProBNP (last 3 results) No results for input(s): PROBNP in the last 8760 hours.    Other results:  Imaging: No results found.   Medications:     Scheduled Medications: . aspirin EC  81 mg Oral Daily  . atorvastatin  40 mg Oral q1800  . carvedilol  6.25 mg Oral BID WC  . furosemide  80 mg Intravenous BID  . heparin  5,000 Units Subcutaneous Q8H  . hydrALAZINE  12.5 mg Oral Q8H  . insulin aspart  0-5 Units Subcutaneous QHS  . insulin aspart  0-9 Units Subcutaneous TID WC  . insulin aspart  3 Units Subcutaneous TID WC  . insulin glargine  15 Units Subcutaneous BID  . isosorbide mononitrate  30 mg Oral BID  .  spironolactone  12.5 mg Oral Daily    Infusions: . milrinone 0.25 mcg/kg/min (05/23/16 2216)    PRN Medications: acetaminophen, nitroGLYCERIN, ondansetron (ZOFRAN) IV, sodium chloride flush   Assessment:   1. A/C Systolic Heart Failure  2. CAD-severe 2 vessel disease LAD RCA 3. CKD 4.Uncontrolled DM- Hgb AC 11.3  5. Hyperkalemia  6. Elevated TSH 7. COPD   Plan/Discussion:    Admitted with volume overload and severe 2vCAD. Transferred from [facility] for further management of HF and possible CABG vs high-risk 2v PCI.  ECHO Ef 20-25%.   Todays CO-OX is 65%. Continue milrinone 0.25 mcg. CVP 10. Continue lasix 80 mg twice a day. Continue carvedilol 6.25 mg twice a day. Tolerating hydralazine/imdur. Creatinine coming down a little 1.8>1.7>1.68. Tolerated spiro.  BP better. No with elevated renal function.   Add ted hose.   Hgb A1C 11.3. Lantus adjusted. Continue sliding scale.   Would like to get CMRI but creatinine 1.68. Need creatinine less than or = 1.5.    Dr Darcey Nora following. Plan for CABG Monday. Cardiac rehab following.    Length of Stay: 4   Amy Clegg NP-C  05/24/2016, 10:36 AM  Advanced Heart Failure Team Pager [phone] (M-F; 7a - 4p)  Please contact [facility] for night-coverage after hours (4p -7a ) and weekends on [email or web address]  Patient seen and examined with Darrick Grinder, NP. We discussed all aspects of the encounter. I agree with the assessment and plan as stated above.   Much improved. CVP coming down. Co-ox looks good. Will continue milrinone. Continue IV lasix at least one more day. Tolerating hydral/imdur. No ACE prior to CABG with CKS. Can try after. No b-blocker with milrinone. Continue to ambulate.   Fay Bagg,MD 3:30 PM

## 2016-05-25 ENCOUNTER — Inpatient Hospital Stay (HOSPITAL_COMMUNITY): Payer: Medicaid Other

## 2016-05-25 DIAGNOSIS — I2510 Atherosclerotic heart disease of native coronary artery without angina pectoris: Secondary | ICD-10-CM

## 2016-05-25 LAB — GLUCOSE, CAPILLARY
GLUCOSE-CAPILLARY: 64 mg/dL — AB (ref 65–99)
GLUCOSE-CAPILLARY: 298 mg/dL — AB (ref 65–99)
GLUCOSE-CAPILLARY: 155 mg/dL — AB (ref 65–99)
Glucose-Capillary: 176 mg/dL — ABNORMAL HIGH (ref 65–99)

## 2016-05-25 LAB — COMPREHENSIVE METABOLIC PANEL
ALT: 24 U/L (ref 14–54)
AST: 23 U/L (ref 15–41)
Albumin: 2.4 g/dL — ABNORMAL LOW (ref 3.5–5.0)
Alkaline Phosphatase: 204 U/L — ABNORMAL HIGH (ref 38–126)
Anion gap: 10 (ref 5–15)
BUN: 47 mg/dL — ABNORMAL HIGH (ref 6–20)
CO2: 30 mmol/L (ref 22–32)
Calcium: 8.2 mg/dL — ABNORMAL LOW (ref 8.9–10.3)
Chloride: 95 mmol/L — ABNORMAL LOW (ref 101–111)
Creatinine, Ser: 1.78 mg/dL — ABNORMAL HIGH (ref 0.44–1.00)
GFR calc Af Amer: 34 mL/min — ABNORMAL LOW (ref >60)
GFR calc non Af Amer: 29 mL/min — ABNORMAL LOW (ref >60)
Glucose, Bld: 250 mg/dL — ABNORMAL HIGH (ref 65–99)
Potassium: 4.1 mmol/L (ref 3.5–5.1)
Sodium: 135 mmol/L (ref 135–145)
Total Bilirubin: 0.6 mg/dL (ref 0.3–1.2)
Total Protein: 5.6 g/dL — ABNORMAL LOW (ref 6.5–8.1)

## 2016-05-25 LAB — COOXEMETRY PANEL
CARBOXYHEMOGLOBIN: 1.2 % (ref 0.5–1.5)
Methemoglobin: 1 % (ref 0.0–1.5)
O2 Saturation: 61.4 %
Total hemoglobin: 8.8 g/dL — ABNORMAL LOW (ref 12.0–16.0)

## 2016-05-25 LAB — CBC
HEMATOCRIT: 29.2 % — AB (ref 36.0–46.0)
HEMOGLOBIN: 9.3 g/dL — AB (ref 12.0–15.0)
MCH: 26.4 pg (ref 26.0–34.0)
MCHC: 31.8 g/dL (ref 30.0–36.0)
MCV: 83 fL (ref 78.0–100.0)
Platelets: 300 10*3/uL (ref 150–400)
RBC: 3.52 MIL/uL — ABNORMAL LOW (ref 3.87–5.11)
RDW: 15 % (ref 11.5–15.5)
WBC: 7.2 10*3/uL (ref 4.0–10.5)

## 2016-05-25 NOTE — Progress Notes (Signed)
Patient Name: Theresa Rogers      SUBJECTIVE: admitted 12.11 from [facility] because of refractory heart failure  On Milrinone, and carvediolol and hydralazine   Underwent CATH >>RHC/LHC 05/20/2016   LVEP 33  CO 3.98/2.26  Prox RCA lesion, 70 %stenosed.  Dist RCA lesion, 80 %stenosed.  Prox RCA to Mid RCA lesion, 0 %stenosed.  Mid Cx lesion, 0 %stenosed.  Dist LAD lesion, 90 %stenosed.  Mid LAD lesion, 95 %stenosed.  Prox Cx lesion, 30 %stenosed.   Mid RCA lesion, 90 %stenosed.  Echo >>  EF 20%  ECG personally reviewed >> Narrow QRS  CABG anticipated MOnday   Without chest pain or shortness of breath, min edema   Past Medical History:  Diagnosis Date  . Anemia   . CAD (coronary artery disease)    a. s/p MI/syncope in 2009 s/p remote stenting; b. NSTEMI 06/2015, cardiac cath 06/2015 pLAD 70% FFR 0.88 not clinically sig, dLAD 90%, mLCx 80% s/p PCI/DES 0%, pRCA 70%, mRCA 80%, dRCA 80%; c. cath 05/2016 mLAD 95%, dLAD 90%, dLAD 90%, pRCA 70%, mRCA 90%, dRCA 80%  . Chronic systolic CHF (congestive heart failure) ([place])   . CKD (chronic kidney disease), stage III   . Diabetes mellitus without complication ([place])   . HLD (hyperlipidemia)   . HTN (hypertension)   . Ischemic cardiomyopathy    a. echo 06/2015: EF 30-35%, diffuse HK, nable to exclude RWMA, LV diastolic function parameters were normal, mild MR, left atrium was mildly dilated, PASP 28 mm Hg, IVC was dilated c/w elevated CVP; b. echo 05/2016: EF 20%, severe global HK with AK in ant, anteroseptal, apical wall, GR1DD, mild MR, mod dilated LA, RV sys fxn mod reduced, PASP 53 mmHg, triv pericardial effusion, L pleural effusion  . PAF (paroxysmal atrial fibrillation) ([place])    a. new onset 06/2015; b. CHADS2VASc = 5 (CHF, HTN, age x 1, vascular dz, female); c. not on long term full dose anticoagulation in an effort to avoid triple therapy given her anemia     Scheduled Meds:  Scheduled Meds: . aspirin EC  81 mg Oral  Daily  . atorvastatin  40 mg Oral q1800  . carvedilol  6.25 mg Oral BID WC  . furosemide  80 mg Intravenous BID  . heparin  5,000 Units Subcutaneous Q8H  . hydrALAZINE  12.5 mg Oral Q8H  . insulin aspart  0-5 Units Subcutaneous QHS  . insulin aspart  0-9 Units Subcutaneous TID WC  . insulin aspart  3 Units Subcutaneous TID WC  . insulin glargine  15 Units Subcutaneous BID  . isosorbide mononitrate  30 mg Oral BID  . spironolactone  12.5 mg Oral Daily   Continuous Infusions: . milrinone 0.25 mcg/kg/min (05/25/16 0900)   acetaminophen, nitroGLYCERIN, ondansetron (ZOFRAN) IV, sodium chloride flush    PHYSICAL EXAM Vitals:   05/24/16 2222 05/25/16 0000 05/25/16 0342 05/25/16 0734  BP: (!) 145/80  135/80 139/69  Pulse:  78 73   Resp:    18  Temp:  98.6 F (37 C) 98.4 F (36.9 C) 98.6 F (37 C)  TempSrc:  Oral Oral Oral  SpO2:  94% 95% 97%  Weight:   144 lb 9.6 oz (65.6 kg)   Height:        Well developed and nourished in no acute distress HENT normal Neck supple with JVP-flat Clear Regular rate and rhythm, no murmurs or gallops Abd-soft with active BS No Clubbing cyanosis tr edema Skin-warm  and dry A & Oriented  Grossly normal sensory and motor function   TELEMETRY: Reviewed personnally pt in  Sinus with rare OPVC :  COOX 61   Intake/Output Summary (Last 24 hours) at 05/25/16 1002 Last data filed at 05/25/16 0800  Gross per 24 hour  Intake            112.2 ml  Output                0 ml  Net            112.2 ml    LABS: Basic Metabolic Panel:  Recent Labs Lab 05/19/16 0434 05/20/16 0433 05/20/16 2005 05/21/16 0320 05/22/16 0634 05/23/16 0328 05/24/16 0430 05/25/16 0412  NA 132* 133*  --  133* 137 137 135 135  K 4.4 4.2  --  5.2* 3.9 3.7 3.6 4.1  CL 96* 97*  --  96* 98* 97* 98* 95*  CO2 31 30  --  31 30 33* 30 30  GLUCOSE 309* 301*  --  427* 214* 131* 144* 250*  BUN 57* 55*  --  46* 46* 45* 43* 47*  CREATININE 1.76* 1.66* 1.53* 1.87* 1.78*  1.71* 1.68* 1.78*  CALCIUM 8.3* 8.3*  --  8.6* 8.6* 8.4* 8.3* 8.2*  PHOS  --  3.4  --   --   --   --   --   --    Cardiac Enzymes: No results for input(s): CKTOTAL, CKMB, CKMBINDEX, TROPONINI in the last 72 hours. CBC:  Recent Labs Lab 05/19/16 0434 05/20/16 2005 05/21/16 0320 05/23/16 0328 05/24/16 0430 05/25/16 0412  WBC 6.8 7.3 5.8 7.2 7.5 7.2  HGB 9.7* 9.9* 9.5* 9.0* 8.8* 9.3*  HCT 29.5* 30.9* 30.0* 28.6* 28.3* 29.2*  MCV 82.3 83.1 84.3 82.9 84.2 83.0  PLT 293 306 286 284 288 300   PROTIME: No results for input(s): LABPROT, INR in the last 72 hours. Liver Function Tests:  Recent Labs  05/25/16 0412  AST 23  ALT 24  ALKPHOS 204*  BILITOT 0.6  PROT 5.6*  ALBUMIN 2.4*   No results for input(s): LIPASE, AMYLASE in the last 72 hours. BNP: BNP (last 3 results)  Recent Labs  06/19/15 0923 05/13/16 0905  BNP 1,782.0* 2,790.0*    ProBNP (last 3 results) No results for input(s): PROBNP in the last 8760 hours.    ASSESSMENT AND PLAN:  Principal Problem:   Acute on chronic systolic CHF (congestive heart failure) (HCC) Active Problems:   HLD (hyperlipidemia)   HTN (hypertension)   CKD (chronic kidney disease), stage III   Ischemic cardiomyopathy   CAD (coronary artery disease)   Diabetes mellitus with complication ([place])   Pulmonary hypertension  With renal insufficiency  GFR< 30 will stop aldactone BB is on board, despite CHF notes seems to be tolerating stable for CABG    Signed, Virl Axe MD  05/25/2016

## 2016-05-25 NOTE — Progress Notes (Signed)
Patient ID: Theresa Rogers, female   DOB: 1950-12-06, 65 y.o.   MRN: [id number]      [address]       [place] [address]             [phone]                   Procedure(s) (LRB): CORONARY ARTERY BYPASS GRAFTING (CABG) (N/A) TRANSESOPHAGEAL ECHOCARDIOGRAM (TEE) (N/A)  LOS: 5 days   Subjective: Alert, breathing better, no chest pain   Objective: Vital signs in last 24 hours: Patient Vitals for the past 24 hrs:  BP Temp Temp src Pulse Resp SpO2 Weight  05/25/16 0734 139/69 98.6 F (37 C) Oral - 18 97 % -  05/25/16 0342 135/80 98.4 F (36.9 C) Oral 73 - 95 % 144 lb 9.6 oz (65.6 kg)  05/25/16 0000 - 98.6 F (37 C) Oral 78 - 94 % -  05/24/16 2222 (!) 145/80 - - - - - -  05/24/16 1941 130/87 98.7 F (37.1 C) Oral 85 19 95 % -  05/24/16 1119 135/74 98.4 F (36.9 C) Oral - 19 92 % -    Filed Weights   05/23/16 0100 05/24/16 0420 05/25/16 0342  Weight: 149 lb 14.4 oz (68 kg) 147 lb 9.6 oz (67 kg) 144 lb 9.6 oz (65.6 kg)    Hemodynamic parameters for last 24 hours: CVP:  [6 mmHg-11 mmHg] 6 mmHg  Intake/Output from previous day: 12/15 0701 - 12/16 0700 In: 347.1 [P.O.:240; I.V.:107.1] Out: -  Intake/Output this shift: Total I/O In: 20.4 [I.V.:20.4] Out: -   Scheduled Meds: . aspirin EC  81 mg Oral Daily  . atorvastatin  40 mg Oral q1800  . carvedilol  6.25 mg Oral BID WC  . furosemide  80 mg Intravenous BID  . heparin  5,000 Units Subcutaneous Q8H  . hydrALAZINE  12.5 mg Oral Q8H  . insulin aspart  0-5 Units Subcutaneous QHS  . insulin aspart  0-9 Units Subcutaneous TID WC  . insulin aspart  3 Units Subcutaneous TID WC  . insulin glargine  15 Units Subcutaneous BID  . isosorbide mononitrate  30 mg Oral BID   Continuous Infusions: . milrinone 0.25 mcg/kg/min (05/25/16 0900)   PRN Meds:.acetaminophen, nitroGLYCERIN, ondansetron (ZOFRAN) IV, sodium chloride flush  General appearance: alert and cooperative Neurologic: intact Heart: regular  rate and rhythm, S1, S2 normal, no murmur, click, rub or gallop Lungs: clear to auscultation bilaterally Abdomen: soft, non-tender; bowel sounds normal; no masses,  no organomegaly Extremities: extremities normal, atraumatic, no cyanosis or edema and Homans sign is negative, no sign of DVT  Lab Results: CBC: Recent Labs  05/24/16 0430 05/25/16 0412  WBC 7.5 7.2  HGB 8.8* 9.3*  HCT 28.3* 29.2*  PLT 288 300   BMET:  Recent Labs  05/24/16 0430 05/25/16 0412  NA 135 135  K 3.6 4.1  CL 98* 95*  CO2 30 30  GLUCOSE 144* 250*  BUN 43* 47*  CREATININE 1.68* 1.78*  CALCIUM 8.3* 8.2*    PT/INR: No results for input(s): LABPROT, INR in the last 72 hours.   Radiology No results found.   Assessment/Plan: S/P Procedure(s) (LRB): CORONARY ARTERY BYPASS GRAFTING (CABG) (N/A) TRANSESOPHAGEAL ECHOCARDIOGRAM (TEE) (N/A) Diuresis Labs in am including t& s ordered for am Off aldactone and ace prior to surgery   Grace Isaac MD 05/25/2016 10:35 AM

## 2016-05-25 NOTE — Progress Notes (Signed)
CARDIAC REHAB PHASE I   PRE:  Rate/Rhythm: 64 sinus rhythm  BP:  Supine:   Sitting: 114/50  Standing:    SaO2: 94 % RA  MODE:  Ambulation: 470 ft   POST:  Rate/Rhythem: 73 sinus rhythm  BP:  Supine:   Sitting: 125/57  Standing:    SaO2: 95% RA  0942-1002  Pt ambulated in hallway x 1 assist however very independent.  Pt has been ambulating on her own.  Steady gait, asymptomatic.  Pt returned to bed, call light in reach.    Pre op teaching reinforced.  Pt verbalized understanding.   [employer]

## 2016-05-26 ENCOUNTER — Inpatient Hospital Stay (HOSPITAL_COMMUNITY): Payer: Medicaid Other

## 2016-05-26 LAB — BASIC METABOLIC PANEL
Anion gap: 8 (ref 5–15)
BUN: 45 mg/dL — ABNORMAL HIGH (ref 6–20)
CALCIUM: 7.3 mg/dL — AB (ref 8.9–10.3)
CO2: 26 mmol/L (ref 22–32)
CREATININE: 1.46 mg/dL — AB (ref 0.44–1.00)
Chloride: 102 mmol/L (ref 101–111)
GFR, EST AFRICAN AMERICAN: 43 mL/min — AB (ref >60)
GFR, EST NON AFRICAN AMERICAN: 37 mL/min — AB (ref >60)
Glucose, Bld: 176 mg/dL — ABNORMAL HIGH (ref 65–99)
Potassium: 3.9 mmol/L (ref 3.5–5.1)
Sodium: 136 mmol/L (ref 135–145)

## 2016-05-26 LAB — CBC
HEMATOCRIT: 29.1 % — AB (ref 36.0–46.0)
HEMOGLOBIN: 9 g/dL — AB (ref 12.0–15.0)
MCH: 25.9 pg — ABNORMAL LOW (ref 26.0–34.0)
MCHC: 30.9 g/dL (ref 30.0–36.0)
MCV: 83.6 fL (ref 78.0–100.0)
Platelets: 355 10*3/uL (ref 150–400)
RBC: 3.48 MIL/uL — ABNORMAL LOW (ref 3.87–5.11)
RDW: 15 % (ref 11.5–15.5)
WBC: 7.5 10*3/uL (ref 4.0–10.5)

## 2016-05-26 LAB — COMPREHENSIVE METABOLIC PANEL WITH GFR
ALT: 25 U/L (ref 14–54)
AST: 34 U/L (ref 15–41)
Albumin: 2.6 g/dL — ABNORMAL LOW (ref 3.5–5.0)
Alkaline Phosphatase: 194 U/L — ABNORMAL HIGH (ref 38–126)
Anion gap: 9 (ref 5–15)
BUN: 52 mg/dL — ABNORMAL HIGH (ref 6–20)
CO2: 30 mmol/L (ref 22–32)
Calcium: 8.5 mg/dL — ABNORMAL LOW (ref 8.9–10.3)
Chloride: 96 mmol/L — ABNORMAL LOW (ref 101–111)
Creatinine, Ser: 1.92 mg/dL — ABNORMAL HIGH (ref 0.44–1.00)
GFR calc Af Amer: 31 mL/min — ABNORMAL LOW
GFR calc non Af Amer: 26 mL/min — ABNORMAL LOW
Glucose, Bld: 205 mg/dL — ABNORMAL HIGH (ref 65–99)
Potassium: 5.4 mmol/L — ABNORMAL HIGH (ref 3.5–5.1)
Sodium: 135 mmol/L (ref 135–145)
Total Bilirubin: 1.1 mg/dL (ref 0.3–1.2)
Total Protein: 5.8 g/dL — ABNORMAL LOW (ref 6.5–8.1)

## 2016-05-26 LAB — COOXEMETRY PANEL
Carboxyhemoglobin: 1.2 % (ref 0.5–1.5)
Methemoglobin: 1 % (ref 0.0–1.5)
O2 Saturation: 61.7 %
Total hemoglobin: 9.1 g/dL — ABNORMAL LOW (ref 12.0–16.0)

## 2016-05-26 LAB — PREPARE RBC (CROSSMATCH)

## 2016-05-26 LAB — GLUCOSE, CAPILLARY
GLUCOSE-CAPILLARY: 142 mg/dL — AB (ref 65–99)
GLUCOSE-CAPILLARY: 159 mg/dL — AB (ref 65–99)
Glucose-Capillary: 156 mg/dL — ABNORMAL HIGH (ref 65–99)
Glucose-Capillary: 211 mg/dL — ABNORMAL HIGH (ref 65–99)

## 2016-05-26 LAB — ABO/RH: ABO/RH(D): O POS

## 2016-05-26 LAB — PROTIME-INR
INR: 0.99
Prothrombin Time: 13.1 s (ref 11.4–15.2)

## 2016-05-26 MED ORDER — SODIUM CHLORIDE 0.9 % IV SOLN
INTRAVENOUS | Status: DC
  Filled 2016-05-26: qty 30

## 2016-05-26 MED ORDER — TRANEXAMIC ACID 1000 MG/10ML IV SOLN
1.5000 mg/kg/h | INTRAVENOUS | Status: DC
  Administered 2016-05-27: 1.5 mg/kg/h via INTRAVENOUS
  Filled 2016-05-26: qty 25

## 2016-05-26 MED ORDER — CHLORHEXIDINE GLUCONATE 4 % EX LIQD
60.0000 mL | Freq: Once | CUTANEOUS | Status: AC
  Administered 2016-05-26: 4 via TOPICAL
  Filled 2016-05-26: qty 60

## 2016-05-26 MED ORDER — CHLORHEXIDINE GLUCONATE 4 % EX LIQD
60.0000 mL | Freq: Once | CUTANEOUS | Status: AC
  Administered 2016-05-27: 4 via TOPICAL
  Filled 2016-05-26: qty 60

## 2016-05-26 MED ORDER — TEMAZEPAM 15 MG PO CAPS: 15.0000 mg | ORAL_CAPSULE | Freq: Once | ORAL | Status: DC | PRN

## 2016-05-26 MED ORDER — DEXTROSE 5 % IV SOLN
0.0000 ug/min | INTRAVENOUS | Status: DC
  Filled 2016-05-26: qty 4

## 2016-05-26 MED ORDER — VANCOMYCIN HCL 10 G IV SOLR
1250.0000 mg | INTRAVENOUS | Status: DC
  Administered 2016-05-27: 1250 mg via INTRAVENOUS
  Filled 2016-05-26: qty 1250

## 2016-05-26 MED ORDER — BISACODYL 5 MG PO TBEC: 5.0000 mg | DELAYED_RELEASE_TABLET | Freq: Once | ORAL | Status: DC

## 2016-05-26 MED ORDER — TRANEXAMIC ACID (OHS) BOLUS VIA INFUSION
15.0000 mg/kg | INTRAVENOUS | Status: DC
  Filled 2016-05-26: qty 975

## 2016-05-26 MED ORDER — MAGNESIUM SULFATE 50 % IJ SOLN
40.0000 meq | INTRAMUSCULAR | Status: DC
  Filled 2016-05-26: qty 10

## 2016-05-26 MED ORDER — DEXMEDETOMIDINE HCL IN NACL 400 MCG/100ML IV SOLN
0.1000 ug/kg/h | INTRAVENOUS | Status: DC
Start: 2016-05-27 — End: 2016-05-27
  Administered 2016-05-27: .3 ug/kg/h via INTRAVENOUS
  Filled 2016-05-26: qty 100

## 2016-05-26 MED ORDER — DEXTROSE 5 % IV SOLN
1.5000 g | INTRAVENOUS | Status: DC
  Administered 2016-05-27: .75 g via INTRAVENOUS
  Administered 2016-05-27: 1.5 g via INTRAVENOUS
  Filled 2016-05-26: qty 1.5

## 2016-05-26 MED ORDER — TRANEXAMIC ACID (OHS) PUMP PRIME SOLUTION
2.0000 mg/kg | INTRAVENOUS | Status: DC
  Filled 2016-05-26: qty 1.3

## 2016-05-26 MED ORDER — METOPROLOL TARTRATE 12.5 MG HALF TABLET
12.5000 mg | ORAL_TABLET | Freq: Once | ORAL | Status: AC
  Administered 2016-05-27: 12.5 mg via ORAL
  Filled 2016-05-26: qty 1

## 2016-05-26 MED ORDER — PHENYLEPHRINE HCL 10 MG/ML IJ SOLN
30.0000 ug/min | INTRAVENOUS | Status: DC
  Administered 2016-05-27: 20 ug/min via INTRAVENOUS
  Filled 2016-05-26: qty 2

## 2016-05-26 MED ORDER — POTASSIUM CHLORIDE 2 MEQ/ML IV SOLN
80.0000 meq | INTRAVENOUS | Status: DC
  Filled 2016-05-26: qty 40

## 2016-05-26 MED ORDER — CHLORHEXIDINE GLUCONATE 0.12 % MT SOLN
15.0000 mL | Freq: Once | OROMUCOSAL | Status: AC
  Administered 2016-05-27: 15 mL via OROMUCOSAL
  Filled 2016-05-26: qty 15

## 2016-05-26 MED ORDER — DOPAMINE-DEXTROSE 3.2-5 MG/ML-% IV SOLN
0.0000 ug/kg/min | INTRAVENOUS | Status: DC
  Filled 2016-05-26: qty 250

## 2016-05-26 MED ORDER — PAPAVERINE HCL 30 MG/ML IJ SOLN
INTRAMUSCULAR | Status: DC
  Filled 2016-05-26: qty 2.5

## 2016-05-26 MED ORDER — NITROGLYCERIN IN D5W 200-5 MCG/ML-% IV SOLN
2.0000 ug/min | INTRAVENOUS | Status: DC
  Administered 2016-05-27: 5 ug/min via INTRAVENOUS
  Filled 2016-05-26: qty 250

## 2016-05-26 MED ORDER — DIAZEPAM 5 MG PO TABS
5.0000 mg | ORAL_TABLET | Freq: Once | ORAL | Status: AC
  Administered 2016-05-27: 5 mg via ORAL
  Filled 2016-05-26: qty 1

## 2016-05-26 MED ORDER — DEXTROSE 5 % IV SOLN
750.0000 mg | INTRAVENOUS | Status: DC
  Filled 2016-05-26: qty 750

## 2016-05-26 MED ORDER — INSULIN REGULAR HUMAN 100 UNIT/ML IJ SOLN
INTRAMUSCULAR | Status: DC
  Filled 2016-05-26: qty 2.5

## 2016-05-26 NOTE — Progress Notes (Signed)
Patient Name: Theresa Rogers      SUBJECTIVE: admitted 12.11 from [facility] because of refractory heart failure  On Milrinone, and carvediolol and hydralazine   Underwent CATH >>RHC/LHC 05/20/2016   LVEP 33  CO 3.98/2.26  Prox RCA lesion, 70 %stenosed.  Dist RCA lesion, 80 %stenosed.  Prox RCA to Mid RCA lesion, 0 %stenosed.  Mid Cx lesion, 0 %stenosed.  Dist LAD lesion, 90 %stenosed.  Mid LAD lesion, 95 %stenosed.  Prox Cx lesion, 30 %stenosed.   Mid RCA lesion, 90 %stenosed.  Echo >>  EF 20%  ECG personally reviewed >> Narrow QRS  CABG anticipated MOnday   Without chest pain or shortness of breath   Past Medical History:  Diagnosis Date  . Anemia   . CAD (coronary artery disease)    a. s/p MI/syncope in 2009 s/p remote stenting; b. NSTEMI 06/2015, cardiac cath 06/2015 pLAD 70% FFR 0.88 not clinically sig, dLAD 90%, mLCx 80% s/p PCI/DES 0%, pRCA 70%, mRCA 80%, dRCA 80%; c. cath 05/2016 mLAD 95%, dLAD 90%, dLAD 90%, pRCA 70%, mRCA 90%, dRCA 80%  . Chronic systolic CHF (congestive heart failure) ([place])   . CKD (chronic kidney disease), stage III   . Diabetes mellitus without complication ([place])   . HLD (hyperlipidemia)   . HTN (hypertension)   . Ischemic cardiomyopathy    a. echo 06/2015: EF 30-35%, diffuse HK, nable to exclude RWMA, LV diastolic function parameters were normal, mild MR, left atrium was mildly dilated, PASP 28 mm Hg, IVC was dilated c/w elevated CVP; b. echo 05/2016: EF 20%, severe global HK with AK in ant, anteroseptal, apical wall, GR1DD, mild MR, mod dilated LA, RV sys fxn mod reduced, PASP 53 mmHg, triv pericardial effusion, L pleural effusion  . PAF (paroxysmal atrial fibrillation) ([place])    a. new onset 06/2015; b. CHADS2VASc = 5 (CHF, HTN, age x 1, vascular dz, female); c. not on long term full dose anticoagulation in an effort to avoid triple therapy given her anemia     Scheduled Meds:  Scheduled Meds: . aspirin EC  81 mg Oral Daily  .  atorvastatin  40 mg Oral q1800  . carvedilol  6.25 mg Oral BID WC  . furosemide  80 mg Intravenous BID  . heparin  5,000 Units Subcutaneous Q8H  . hydrALAZINE  12.5 mg Oral Q8H  . insulin aspart  0-5 Units Subcutaneous QHS  . insulin aspart  0-9 Units Subcutaneous TID WC  . insulin aspart  3 Units Subcutaneous TID WC  . insulin glargine  15 Units Subcutaneous BID  . isosorbide mononitrate  30 mg Oral BID   Continuous Infusions: . milrinone 0.25 mcg/kg/min (05/26/16 0400)   acetaminophen, nitroGLYCERIN, ondansetron (ZOFRAN) IV, sodium chloride flush    PHYSICAL EXAM Vitals:   05/25/16 2318 05/26/16 0351 05/26/16 0448 05/26/16 0743  BP: (!) 128/59  (!) 135/94 137/76  Pulse:      Resp: 18  20 19   Temp: 99 F (37.2 C)  98.5 F (36.9 C) 98.4 F (36.9 C)  TempSrc: Oral  Oral Oral  SpO2: 98%  93% 98%  Weight:  143 lb 4.8 oz (65 kg)    Height:        Well developed and nourished in no acute distress HENT normal Neck supple with JVP-flat Clear Regular rate and rhythm, no murmurs or gallops Abd-soft with active BS No Clubbing cyanosis no  edema Skin-warm and dry A & Oriented  Grossly normal  sensory and motor function   TELEMETRY: Reviewed personnally pt in  Sinus with rare OPVC :  COOX 61 stable    Intake/Output Summary (Last 24 hours) at 05/26/16 0833 Last data filed at 05/26/16 0400  Gross per 24 hour  Intake              102 ml  Output                0 ml  Net              102 ml    LABS: Basic Metabolic Panel:  Recent Labs Lab 05/20/16 0433 05/20/16 2005 05/21/16 0320 05/22/16 0634 05/23/16 0328 05/24/16 0430 05/25/16 0412 05/26/16 0430  NA 133*  --  133* 137 137 135 135 135  K 4.2  --  5.2* 3.9 3.7 3.6 4.1 5.4*  CL 97*  --  96* 98* 97* 98* 95* 96*  CO2 30  --  31 30 33* 30 30 30   GLUCOSE 301*  --  427* 214* 131* 144* 250* 205*  BUN 55*  --  46* 46* 45* 43* 47* 52*  CREATININE 1.66* 1.53* 1.87* 1.78* 1.71* 1.68* 1.78* 1.92*  CALCIUM 8.3*  --   8.6* 8.6* 8.4* 8.3* 8.2* 8.5*  PHOS 3.4  --   --   --   --   --   --   --    Cardiac Enzymes: No results for input(s): CKTOTAL, CKMB, CKMBINDEX, TROPONINI in the last 72 hours. CBC:  Recent Labs Lab 05/20/16 2005 05/21/16 0320 05/23/16 0328 05/24/16 0430 05/25/16 0412 05/26/16 0430  WBC 7.3 5.8 7.2 7.5 7.2 7.5  HGB 9.9* 9.5* 9.0* 8.8* 9.3* 9.0*  HCT 30.9* 30.0* 28.6* 28.3* 29.2* 29.1*  MCV 83.1 84.3 82.9 84.2 83.0 83.6  PLT 306 286 284 288 300 355   PROTIME:  Recent Labs  05/26/16 0430  LABPROT 13.1  INR 0.99   Liver Function Tests:  Recent Labs  05/25/16 0412 05/26/16 0430  AST 23 34  ALT 24 25  ALKPHOS 204* 194*  BILITOT 0.6 1.1  PROT 5.6* 5.8*  ALBUMIN 2.4* 2.6*   No results for input(s): LIPASE, AMYLASE in the last 72 hours. BNP: BNP (last 3 results)  Recent Labs  06/19/15 0923 05/13/16 0905  BNP 1,782.0* 2,790.0*    ProBNP (last 3 results) No results for input(s): PROBNP in the last 8760 hours.    ASSESSMENT AND PLAN:  Principal Problem:   Acute on chronic systolic CHF (congestive heart failure) (HCC) Active Problems:   HLD (hyperlipidemia)   HTN (hypertension)   CKD (chronic kidney disease), stage III   Ischemic cardiomyopathy   CAD (coronary artery disease)   Diabetes mellitus with complication (H[facility])   Pulmonary hypertension  Hyperkalemia  With renal insufficiency  Worse today Spoke with Dr Gerilyn Pilgrim  Will stop diuresis and recheck BMET tonight Unfortunately she got diruetic this am, so will push po fluids Weight down 15 /lbs over last few days  Will stop BB and continue other meds BB is on board, despite CHF notes seems to be tolerating stable for CABG    Signed, Virl Axe MD  05/26/2016

## 2016-05-26 NOTE — Progress Notes (Signed)
Patient ID: Theresa Rogers, female   DOB: [birth year], 65 y.o.   MRN: [id number]      [address]       [place] [address]             [phone]                   Procedure(s) (LRB): CORONARY ARTERY BYPASS GRAFTING (CABG) (N/A) TRANSESOPHAGEAL ECHOCARDIOGRAM (TEE) (N/A)  LOS: 6 days   Subjective: Alert, breathing better, no chest pain , no edema  Objective: Vital signs in last 24 hours: Patient Vitals for the past 24 hrs:  BP Temp Temp src Resp SpO2 Weight  05/26/16 1602 (!) 141/74 97.9 F (36.6 C) Oral 16 97 % -  05/26/16 1140 136/72 97.9 F (36.6 C) Oral 15 97 % -  05/26/16 0743 137/76 98.4 F (36.9 C) Oral 19 98 % -  05/26/16 0448 (!) 135/94 98.5 F (36.9 C) Oral 20 93 % -  05/26/16 0351 - - - - - 143 lb 4.8 oz (65 kg)  05/25/16 2318 (!) 128/59 99 F (37.2 C) Oral 18 98 % -  05/25/16 2146 (!) 145/74 - - - - -  05/25/16 2000 124/60 98.7 F (37.1 C) Oral 20 96 % -    Filed Weights   05/24/16 0420 05/25/16 0342 05/26/16 0351  Weight: 147 lb 9.6 oz (67 kg) 144 lb 9.6 oz (65.6 kg) 143 lb 4.8 oz (65 kg)    Hemodynamic parameters for last 24 hours: CVP:  [8 mmHg-10 mmHg] 8 mmHg  Intake/Output from previous day: 12/16 0701 - 12/17 0700 In: 122.4 [I.V.:122.4] Out: -  Intake/Output this shift: Total I/O In: 630 [P.O.:770; I.V.:51] Out: -   Scheduled Meds: . aspirin EC  81 mg Oral Daily  . atorvastatin  40 mg Oral q1800  . bisacodyl  5 mg Oral Once  . chlorhexidine  60 mL Topical Once   And  . [START ON 05/27/2016] chlorhexidine  60 mL Topical Once  . [START ON 05/27/2016] chlorhexidine  15 mL Mouth/Throat Once  . [START ON 05/27/2016] diazepam  5 mg Oral Once  . heparin  5,000 Units Subcutaneous Q8H  . hydrALAZINE  12.5 mg Oral Q8H  . insulin aspart  0-5 Units Subcutaneous QHS  . insulin aspart  0-9 Units Subcutaneous TID WC  . insulin aspart  3 Units Subcutaneous TID WC  . isosorbide mononitrate  30 mg Oral BID  . [START ON 05/27/2016]  metoprolol tartrate  12.5 mg Oral Once   Continuous Infusions: . milrinone 0.25 mcg/kg/min (05/26/16 0400)   PRN Meds:.acetaminophen, nitroGLYCERIN, ondansetron (ZOFRAN) IV, sodium chloride flush, temazepam  General appearance: alert and cooperative Neurologic: intact Heart: regular rate and rhythm, S1, S2 normal, no murmur, click, rub or gallop Lungs: clear to auscultation bilaterally Abdomen: soft, non-tender; bowel sounds normal; no masses,  no organomegaly Extremities: extremities normal, atraumatic, no cyanosis or edema and Homans sign is negative, no sign of DVT  Lab Results: CBC:  Recent Labs  05/25/16 0412 05/26/16 0430  WBC 7.2 7.5  HGB 9.3* 9.0*  HCT 29.2* 29.1*  PLT 300 355   BMET:   Recent Labs  05/26/16 0430 05/26/16 1704  NA 135 136  K 5.4* 3.9  CL 96* 102  CO2 30 26  GLUCOSE 205* 176*  BUN 52* 45*  CREATININE 1.92* 1.46*  CALCIUM 8.5* 7.3*    PT/INR:   Recent Labs  05/26/16 0430  LABPROT 13.1  INR  0.99     Radiology Dg Chest 2 View  Result Date: 05/25/2016 CLINICAL DATA:  CHF EXAM: CHEST  2 VIEW COMPARISON:  05/22/2016 FINDINGS: There is cardiomegaly. No overt edema. Small bilateral pleural effusions, left greater than right, similar to prior study. No acute bony abnormality appear IMPRESSION: Cardiomegaly. Bilateral pleural effusions, left greater than right, stable since prior study Electronically Signed   By: Rolm Baptise M.D.   On: 05/25/2016 10:36    Assessment/Plan: S/P Procedure(s) (LRB): CORONARY ARTERY BYPASS GRAFTING (CABG) (N/A) TRANSESOPHAGEAL ECHOCARDIOGRAM (TEE) (N/A) Repeat bmp- cr 1.4  Off aldactone and ace prior to surgery  Dicussed with Dr Everlene Balls, plan to proceed with cabg in am  Grace Isaac MD 05/26/2016 6:28 PM

## 2016-05-27 ENCOUNTER — Inpatient Hospital Stay (HOSPITAL_COMMUNITY): Payer: Medicaid Other | Admitting: Certified Registered Nurse Anesthetist

## 2016-05-27 ENCOUNTER — Encounter (HOSPITAL_COMMUNITY)
Admission: AD | Disposition: A | Discharge status: Home Health | Payer: Self-pay | Source: Other Acute Inpatient Hospital | Attending: Cardiothoracic Surgery

## 2016-05-27 ENCOUNTER — Inpatient Hospital Stay (HOSPITAL_COMMUNITY): Payer: Medicaid Other

## 2016-05-27 DIAGNOSIS — Z951 Presence of aortocoronary bypass graft: Secondary | ICD-10-CM

## 2016-05-27 HISTORY — PX: TEE WITHOUT CARDIOVERSION: SHX5443

## 2016-05-27 HISTORY — PX: CORONARY ARTERY BYPASS GRAFT: SHX141

## 2016-05-27 LAB — CBC
HCT: 27.8 % — ABNORMAL LOW (ref 36.0–46.0)
HCT: 29.7 % — ABNORMAL LOW (ref 36.0–46.0)
HEMATOCRIT: 28.9 % — AB (ref 36.0–46.0)
Hemoglobin: 9.3 g/dL — ABNORMAL LOW (ref 12.0–15.0)
Hemoglobin: 9.3 g/dL — ABNORMAL LOW (ref 12.0–15.0)
Hemoglobin: 9.6 g/dL — ABNORMAL LOW (ref 12.0–15.0)
MCH: 27 pg (ref 26.0–34.0)
MCH: 27.3 pg (ref 26.0–34.0)
MCH: 27.2 pg (ref 26.0–34.0)
MCHC: 32.2 g/dL (ref 30.0–36.0)
MCHC: 33.5 g/dL (ref 30.0–36.0)
MCHC: 32.3 g/dL (ref 30.0–36.0)
MCV: 83.8 fL (ref 78.0–100.0)
MCV: 81.5 fL (ref 78.0–100.0)
MCV: 84.1 fL (ref 78.0–100.0)
PLATELETS: 358 10*3/uL (ref 150–400)
PLATELETS: 250 10*3/uL (ref 150–400)
Platelets: 257 10*3/uL (ref 150–400)
RBC: 3.45 MIL/uL — ABNORMAL LOW (ref 3.87–5.11)
RBC: 3.41 MIL/uL — ABNORMAL LOW (ref 3.87–5.11)
RBC: 3.53 MIL/uL — ABNORMAL LOW (ref 3.87–5.11)
RDW: 15.2 % (ref 11.5–15.5)
RDW: 15.2 % (ref 11.5–15.5)
RDW: 15.8 % — ABNORMAL HIGH (ref 11.5–15.5)
WBC: 6.9 10*3/uL (ref 4.0–10.5)
WBC: 12 10*3/uL — ABNORMAL HIGH (ref 4.0–10.5)
WBC: 13.8 10*3/uL — ABNORMAL HIGH (ref 4.0–10.5)

## 2016-05-27 LAB — POCT I-STAT 3, ART BLOOD GAS (G3+)
Acid-Base Excess: 6 mmol/L — ABNORMAL HIGH (ref 0.0–2.0)
Acid-Base Excess: 7 mmol/L — ABNORMAL HIGH (ref 0.0–2.0)
Acid-base deficit: 1 mmol/L (ref 0.0–2.0)
BICARBONATE: 30.9 mmol/L — AB (ref 20.0–28.0)
Bicarbonate: 30.1 mmol/L — ABNORMAL HIGH (ref 20.0–28.0)
Bicarbonate: 23.1 mmol/L (ref 20.0–28.0)
O2 SAT: 100 %
O2 Saturation: 100 %
O2 Saturation: 99 %
PCO2 ART: 39 mmHg (ref 32.0–48.0)
PCO2 ART: 40.6 mmHg (ref 32.0–48.0)
PCO2 ART: 32.3 mmHg (ref 32.0–48.0)
PH ART: 7.495 — AB (ref 7.350–7.450)
PO2 ART: 347 mmHg — AB (ref 83.0–108.0)
PO2 ART: 488 mmHg — AB (ref 83.0–108.0)
TCO2: 31 mmol/L (ref 0–100)
TCO2: 32 mmol/L (ref 0–100)
TCO2: 24 mmol/L (ref 0–100)
pH, Arterial: 7.49 — ABNORMAL HIGH (ref 7.350–7.450)
pH, Arterial: 7.46 — ABNORMAL HIGH (ref 7.350–7.450)
pO2, Arterial: 133 mmHg — ABNORMAL HIGH (ref 83.0–108.0)

## 2016-05-27 LAB — POCT I-STAT, CHEM 8
BUN: 45 mg/dL — ABNORMAL HIGH (ref 6–20)
BUN: 42 mg/dL — AB (ref 6–20)
BUN: 45 mg/dL — ABNORMAL HIGH (ref 6–20)
BUN: 42 mg/dL — AB (ref 6–20)
BUN: 40 mg/dL — ABNORMAL HIGH (ref 6–20)
BUN: 40 mg/dL — ABNORMAL HIGH (ref 6–20)
CALCIUM ION: 1.14 mmol/L — AB (ref 1.15–1.40)
CALCIUM ION: 1.31 mmol/L (ref 1.15–1.40)
CALCIUM ION: 1.24 mmol/L (ref 1.15–1.40)
CALCIUM ION: 1.24 mmol/L (ref 1.15–1.40)
CHLORIDE: 96 mmol/L — AB (ref 101–111)
CHLORIDE: 100 mmol/L — AB (ref 101–111)
CHLORIDE: 103 mmol/L (ref 101–111)
Calcium, Ion: 1.01 mmol/L — ABNORMAL LOW (ref 1.15–1.40)
Calcium, Ion: 1.14 mmol/L — ABNORMAL LOW (ref 1.15–1.40)
Chloride: 97 mmol/L — ABNORMAL LOW (ref 101–111)
Chloride: 97 mmol/L — ABNORMAL LOW (ref 101–111)
Chloride: 99 mmol/L — ABNORMAL LOW (ref 101–111)
Creatinine, Ser: 1.4 mg/dL — ABNORMAL HIGH (ref 0.44–1.00)
Creatinine, Ser: 1.3 mg/dL — ABNORMAL HIGH (ref 0.44–1.00)
Creatinine, Ser: 1.4 mg/dL — ABNORMAL HIGH (ref 0.44–1.00)
Creatinine, Ser: 1.2 mg/dL — ABNORMAL HIGH (ref 0.44–1.00)
Creatinine, Ser: 1.4 mg/dL — ABNORMAL HIGH (ref 0.44–1.00)
Creatinine, Ser: 1.4 mg/dL — ABNORMAL HIGH (ref 0.44–1.00)
GLUCOSE: 156 mg/dL — AB (ref 65–99)
Glucose, Bld: 171 mg/dL — ABNORMAL HIGH (ref 65–99)
Glucose, Bld: 156 mg/dL — ABNORMAL HIGH (ref 65–99)
Glucose, Bld: 156 mg/dL — ABNORMAL HIGH (ref 65–99)
Glucose, Bld: 147 mg/dL — ABNORMAL HIGH (ref 65–99)
Glucose, Bld: 107 mg/dL — ABNORMAL HIGH (ref 65–99)
HCT: 26 % — ABNORMAL LOW (ref 36.0–46.0)
HCT: 29 % — ABNORMAL LOW (ref 36.0–46.0)
HEMATOCRIT: 28 % — AB (ref 36.0–46.0)
HEMATOCRIT: 23 % — AB (ref 36.0–46.0)
HEMATOCRIT: 24 % — AB (ref 36.0–46.0)
HEMATOCRIT: 26 % — AB (ref 36.0–46.0)
HEMOGLOBIN: 8.2 g/dL — AB (ref 12.0–15.0)
HEMOGLOBIN: 8.8 g/dL — AB (ref 12.0–15.0)
HEMOGLOBIN: 9.9 g/dL — AB (ref 12.0–15.0)
Hemoglobin: 9.5 g/dL — ABNORMAL LOW (ref 12.0–15.0)
Hemoglobin: 7.8 g/dL — ABNORMAL LOW (ref 12.0–15.0)
Hemoglobin: 8.8 g/dL — ABNORMAL LOW (ref 12.0–15.0)
POTASSIUM: 3.4 mmol/L — AB (ref 3.5–5.1)
POTASSIUM: 3.9 mmol/L (ref 3.5–5.1)
POTASSIUM: 4.6 mmol/L (ref 3.5–5.1)
Potassium: 3.7 mmol/L (ref 3.5–5.1)
Potassium: 4.1 mmol/L (ref 3.5–5.1)
Potassium: 3.5 mmol/L (ref 3.5–5.1)
SODIUM: 136 mmol/L (ref 135–145)
SODIUM: 136 mmol/L (ref 135–145)
SODIUM: 137 mmol/L (ref 135–145)
SODIUM: 138 mmol/L (ref 135–145)
SODIUM: 139 mmol/L (ref 135–145)
SODIUM: 136 mmol/L (ref 135–145)
TCO2: 29 mmol/L (ref 0–100)
TCO2: 29 mmol/L (ref 0–100)
TCO2: 30 mmol/L (ref 0–100)
TCO2: 29 mmol/L (ref 0–100)
TCO2: 28 mmol/L (ref 0–100)
TCO2: 26 mmol/L (ref 0–100)

## 2016-05-27 LAB — GLUCOSE, CAPILLARY
GLUCOSE-CAPILLARY: 66 mg/dL (ref 65–99)
GLUCOSE-CAPILLARY: 86 mg/dL (ref 65–99)
GLUCOSE-CAPILLARY: 65 mg/dL (ref 65–99)
GLUCOSE-CAPILLARY: 107 mg/dL — AB (ref 65–99)
GLUCOSE-CAPILLARY: 156 mg/dL — AB (ref 65–99)
GLUCOSE-CAPILLARY: 141 mg/dL — AB (ref 65–99)
Glucose-Capillary: 165 mg/dL — ABNORMAL HIGH (ref 65–99)
Glucose-Capillary: 109 mg/dL — ABNORMAL HIGH (ref 65–99)
Glucose-Capillary: 126 mg/dL — ABNORMAL HIGH (ref 65–99)
Glucose-Capillary: 147 mg/dL — ABNORMAL HIGH (ref 65–99)
Glucose-Capillary: 134 mg/dL — ABNORMAL HIGH (ref 65–99)

## 2016-05-27 LAB — MAGNESIUM: Magnesium: 3 mg/dL — ABNORMAL HIGH (ref 1.7–2.4)

## 2016-05-27 LAB — PROTIME-INR
INR: 1.31
PROTHROMBIN TIME: 16.4 s — AB (ref 11.4–15.2)

## 2016-05-27 LAB — BASIC METABOLIC PANEL
Anion gap: 7 (ref 5–15)
BUN: 51 mg/dL — ABNORMAL HIGH (ref 6–20)
CO2: 32 mmol/L (ref 22–32)
Calcium: 8.4 mg/dL — ABNORMAL LOW (ref 8.9–10.3)
Chloride: 95 mmol/L — ABNORMAL LOW (ref 101–111)
Creatinine, Ser: 1.7 mg/dL — ABNORMAL HIGH (ref 0.44–1.00)
GFR calc Af Amer: 36 mL/min — ABNORMAL LOW (ref >60)
GFR calc non Af Amer: 31 mL/min — ABNORMAL LOW (ref >60)
Glucose, Bld: 208 mg/dL — ABNORMAL HIGH (ref 65–99)
Potassium: 4.2 mmol/L (ref 3.5–5.1)
Sodium: 134 mmol/L — ABNORMAL LOW (ref 135–145)

## 2016-05-27 LAB — COOXEMETRY PANEL
Carboxyhemoglobin: 1.3 % (ref 0.5–1.5)
Methemoglobin: 1.1 % (ref 0.0–1.5)
O2 SAT: 66.1 %
TOTAL HEMOGLOBIN: 9.5 g/dL — AB (ref 12.0–16.0)

## 2016-05-27 LAB — APTT: aPTT: 36 seconds (ref 24–36)

## 2016-05-27 LAB — CREATININE, SERUM
Creatinine, Ser: 1.61 mg/dL — ABNORMAL HIGH (ref 0.44–1.00)
GFR calc Af Amer: 38 mL/min — ABNORMAL LOW (ref >60)
GFR calc non Af Amer: 33 mL/min — ABNORMAL LOW (ref >60)

## 2016-05-27 LAB — PREPARE RBC (CROSSMATCH)

## 2016-05-27 LAB — PLATELET COUNT: Platelets: 241 10*3/uL (ref 150–400)

## 2016-05-27 LAB — HEMOGLOBIN AND HEMATOCRIT, BLOOD
HCT: 25.1 % — ABNORMAL LOW (ref 36.0–46.0)
Hemoglobin: 8.4 g/dL — ABNORMAL LOW (ref 12.0–15.0)

## 2016-05-27 SURGERY — CORONARY ARTERY BYPASS GRAFTING (CABG)
Anesthesia: General | Site: Chest

## 2016-05-27 MED ORDER — CHLORHEXIDINE GLUCONATE 0.12% ORAL RINSE (MEDLINE KIT)
15.0000 mL | Freq: Two times a day (BID) | OROMUCOSAL | Status: DC
  Administered 2016-05-27 – 2016-05-28 (×2): 15 mL via OROMUCOSAL

## 2016-05-27 MED ORDER — PROTAMINE SULFATE 10 MG/ML IV SOLN
INTRAVENOUS | Status: AC
  Filled 2016-05-27: qty 5

## 2016-05-27 MED ORDER — PHENYLEPHRINE 40 MCG/ML (10ML) SYRINGE FOR IV PUSH (FOR BLOOD PRESSURE SUPPORT)
PREFILLED_SYRINGE | INTRAVENOUS | Status: AC
  Filled 2016-05-27: qty 10

## 2016-05-27 MED ORDER — SODIUM CHLORIDE 0.9 % IV SOLN
30.0000 meq | Freq: Once | INTRAVENOUS | Status: AC
  Administered 2016-05-27: 30 meq via INTRAVENOUS
  Filled 2016-05-27: qty 15

## 2016-05-27 MED ORDER — ACETAMINOPHEN 160 MG/5ML PO SOLN: 650.0000 mg | Freq: Once | ORAL | Status: AC

## 2016-05-27 MED ORDER — FENTANYL CITRATE (PF) 250 MCG/5ML IJ SOLN
INTRAMUSCULAR | Status: DC | PRN
  Administered 2016-05-27: 100 ug via INTRAVENOUS
  Administered 2016-05-27: 200 ug via INTRAVENOUS
  Administered 2016-05-27: 50 ug via INTRAVENOUS
  Administered 2016-05-27: 100 ug via INTRAVENOUS
  Administered 2016-05-27: 150 ug via INTRAVENOUS
  Administered 2016-05-27: 100 ug via INTRAVENOUS
  Administered 2016-05-27 (×4): 50 ug via INTRAVENOUS
  Administered 2016-05-27: 250 ug via INTRAVENOUS
  Administered 2016-05-27: 100 ug via INTRAVENOUS

## 2016-05-27 MED ORDER — INSULIN REGULAR BOLUS VIA INFUSION
0.0000 [IU] | Freq: Three times a day (TID) | INTRAVENOUS | Status: DC
  Filled 2016-05-27: qty 10

## 2016-05-27 MED ORDER — DEXTROSE 5 % IV SOLN
1.5000 g | Freq: Two times a day (BID) | INTRAVENOUS | Status: AC
  Administered 2016-05-27 – 2016-05-29 (×4): 1.5 g via INTRAVENOUS
  Filled 2016-05-27 (×4): qty 1.5

## 2016-05-27 MED ORDER — 0.9 % SODIUM CHLORIDE (POUR BTL) OPTIME
TOPICAL | Status: DC | PRN
Start: 2016-05-27 — End: 2016-05-27
  Administered 2016-05-27: 6000 mL

## 2016-05-27 MED ORDER — SODIUM CHLORIDE 0.9 % IV SOLN
INTRAVENOUS | Status: DC
  Administered 2016-05-29: 12:00:00 via INTRAVENOUS

## 2016-05-27 MED ORDER — LACTATED RINGERS IV SOLN: 500.0000 mL | Freq: Once | INTRAVENOUS | Status: DC | PRN

## 2016-05-27 MED ORDER — DEXTROSE 50 % IV SOLN
14.0000 mL | Freq: Once | INTRAVENOUS | Status: AC
  Administered 2016-05-27: 14 mL via INTRAVENOUS

## 2016-05-27 MED ORDER — MORPHINE SULFATE (PF) 2 MG/ML IV SOLN
1.0000 mg | INTRAVENOUS | Status: AC | PRN
  Administered 2016-05-27: 4 mg via INTRAVENOUS
  Administered 2016-05-27: 2 mg via INTRAVENOUS
  Administered 2016-05-27: 4 mg via INTRAVENOUS

## 2016-05-27 MED ORDER — MAGNESIUM SULFATE 4 GM/100ML IV SOLN
4.0000 g | Freq: Once | INTRAVENOUS | Status: AC
  Administered 2016-05-27: 4 g via INTRAVENOUS
  Filled 2016-05-27: qty 100

## 2016-05-27 MED ORDER — ARTIFICIAL TEARS OP OINT
TOPICAL_OINTMENT | OPHTHALMIC | Status: AC
  Filled 2016-05-27: qty 3.5

## 2016-05-27 MED ORDER — METOPROLOL TARTRATE 25 MG/10 ML ORAL SUSPENSION
12.5000 mg | Freq: Two times a day (BID) | ORAL | Status: DC
  Filled 2016-05-27: qty 5

## 2016-05-27 MED ORDER — PANTOPRAZOLE SODIUM 40 MG PO TBEC
40.0000 mg | DELAYED_RELEASE_TABLET | Freq: Every day | ORAL | Status: DC
  Administered 2016-05-29 – 2016-06-08 (×10): 40 mg via ORAL
  Filled 2016-05-27 (×15): qty 1

## 2016-05-27 MED ORDER — LACTATED RINGERS IV SOLN: INTRAVENOUS | Status: DC

## 2016-05-27 MED ORDER — METOPROLOL TARTRATE 5 MG/5ML IV SOLN
2.5000 mg | INTRAVENOUS | Status: DC | PRN
  Administered 2016-05-27 (×2): 2.5 mg via INTRAVENOUS
  Administered 2016-05-30 (×2): 5 mg via INTRAVENOUS
  Filled 2016-05-27 (×2): qty 5

## 2016-05-27 MED ORDER — SODIUM CHLORIDE 0.9 % IV SOLN
Freq: Once | INTRAVENOUS | Status: AC
  Administered 2016-05-27: 12:00:00 via INTRAVENOUS

## 2016-05-27 MED ORDER — ALBUMIN HUMAN 5 % IV SOLN
250.0000 mL | INTRAVENOUS | Status: AC | PRN
  Administered 2016-05-27: 250 mL via INTRAVENOUS

## 2016-05-27 MED ORDER — PROTAMINE SULFATE 10 MG/ML IV SOLN
INTRAVENOUS | Status: AC
  Filled 2016-05-27: qty 25

## 2016-05-27 MED ORDER — EPINEPHRINE PF 1 MG/ML IJ SOLN
0.0000 ug/min | INTRAMUSCULAR | Status: DC
  Filled 2016-05-27: qty 4

## 2016-05-27 MED ORDER — EPINEPHRINE PF 1 MG/ML IJ SOLN
INTRAVENOUS | Status: DC | PRN
  Administered 2016-05-27: 3 ug/min via INTRAVENOUS

## 2016-05-27 MED ORDER — HEPARIN SODIUM (PORCINE) 1000 UNIT/ML IJ SOLN
INTRAMUSCULAR | Status: AC
  Filled 2016-05-27: qty 1

## 2016-05-27 MED ORDER — DOCUSATE SODIUM 100 MG PO CAPS
200.0000 mg | ORAL_CAPSULE | Freq: Every day | ORAL | Status: DC
Start: 2016-05-28 — End: 2016-06-14
  Administered 2016-05-28 – 2016-06-01 (×5): 200 mg via ORAL
  Filled 2016-05-27 (×15): qty 2

## 2016-05-27 MED ORDER — ONDANSETRON HCL 4 MG/2ML IJ SOLN
4.0000 mg | Freq: Four times a day (QID) | INTRAMUSCULAR | Status: DC | PRN
  Administered 2016-05-28 – 2016-06-02 (×4): 4 mg via INTRAVENOUS
  Filled 2016-05-27 (×4): qty 2

## 2016-05-27 MED ORDER — ACETAMINOPHEN 500 MG PO TABS
1000.0000 mg | ORAL_TABLET | Freq: Four times a day (QID) | ORAL | Status: AC
  Administered 2016-05-28 – 2016-06-01 (×13): 1000 mg via ORAL
  Filled 2016-05-27 (×13): qty 2

## 2016-05-27 MED ORDER — HEMOSTATIC AGENTS (NO CHARGE) OPTIME
TOPICAL | Status: DC | PRN
  Administered 2016-05-27: 1 via TOPICAL

## 2016-05-27 MED ORDER — TRAMADOL HCL 50 MG PO TABS
50.0000 mg | ORAL_TABLET | ORAL | Status: DC | PRN
  Administered 2016-05-30: 50 mg via ORAL
  Filled 2016-05-27: qty 1

## 2016-05-27 MED ORDER — SODIUM CHLORIDE 0.9 % IV SOLN
INTRAVENOUS | Status: DC
  Administered 2016-05-27: 1 [IU]/h via INTRAVENOUS
  Filled 2016-05-27 (×2): qty 2.5

## 2016-05-27 MED ORDER — SODIUM CHLORIDE 0.9 % IJ SOLN
INTRAMUSCULAR | Status: DC | PRN
  Administered 2016-05-27: 09:00:00 via TOPICAL

## 2016-05-27 MED ORDER — HEPARIN SODIUM (PORCINE) 1000 UNIT/ML IJ SOLN
INTRAMUSCULAR | Status: DC | PRN
  Administered 2016-05-27: 27000 [IU] via INTRAVENOUS
  Administered 2016-05-27: 3000 [IU] via INTRAVENOUS

## 2016-05-27 MED ORDER — ROCURONIUM BROMIDE 10 MG/ML (PF) SYRINGE
PREFILLED_SYRINGE | INTRAVENOUS | Status: DC | PRN
Start: 2016-05-27 — End: 2016-05-27
  Administered 2016-05-27 (×5): 50 mg via INTRAVENOUS

## 2016-05-27 MED ORDER — FUROSEMIDE 10 MG/ML IJ SOLN
INTRAMUSCULAR | Status: AC
  Filled 2016-05-27: qty 4

## 2016-05-27 MED ORDER — SODIUM CHLORIDE 0.9 % IJ SOLN
OROMUCOSAL | Status: DC | PRN
  Administered 2016-05-27: 09:00:00 via TOPICAL

## 2016-05-27 MED ORDER — ASPIRIN 81 MG PO CHEW
324.0000 mg | CHEWABLE_TABLET | Freq: Every day | ORAL | Status: DC
Start: 2016-05-28 — End: 2016-05-29

## 2016-05-27 MED ORDER — OXYCODONE HCL 5 MG PO TABS
5.0000 mg | ORAL_TABLET | ORAL | Status: DC | PRN
  Administered 2016-05-28 – 2016-05-30 (×3): 5 mg via ORAL
  Filled 2016-05-27 (×3): qty 1

## 2016-05-27 MED ORDER — VANCOMYCIN HCL IN DEXTROSE 1-5 GM/200ML-% IV SOLN
1000.0000 mg | Freq: Once | INTRAVENOUS | Status: DC
  Filled 2016-05-27: qty 200

## 2016-05-27 MED ORDER — MIDAZOLAM HCL 2 MG/2ML IJ SOLN: 2.0000 mg | INTRAMUSCULAR | Status: DC | PRN

## 2016-05-27 MED ORDER — MILRINONE LACTATE IN DEXTROSE 20-5 MG/100ML-% IV SOLN
0.1250 ug/kg/min | INTRAVENOUS | Status: DC
  Administered 2016-05-27 – 2016-05-29 (×3): 0.3 ug/kg/min via INTRAVENOUS
  Administered 2016-05-29: 0.2 ug/kg/min via INTRAVENOUS
  Administered 2016-05-31: 0.125 ug/kg/min via INTRAVENOUS
  Filled 2016-05-27 (×6): qty 100

## 2016-05-27 MED ORDER — ACETAMINOPHEN 160 MG/5ML PO SOLN
1000.0000 mg | Freq: Four times a day (QID) | ORAL | Status: DC
  Administered 2016-05-27: 1000 mg
  Filled 2016-05-27: qty 40.6

## 2016-05-27 MED ORDER — ROCURONIUM BROMIDE 50 MG/5ML IV SOSY
PREFILLED_SYRINGE | INTRAVENOUS | Status: AC
  Filled 2016-05-27: qty 10

## 2016-05-27 MED ORDER — PHENYLEPHRINE HCL 10 MG/ML IJ SOLN
0.0000 ug/min | INTRAMUSCULAR | Status: DC
  Filled 2016-05-27: qty 2

## 2016-05-27 MED ORDER — CHLORHEXIDINE GLUCONATE 0.12 % MT SOLN
15.0000 mL | OROMUCOSAL | Status: AC
  Administered 2016-05-27: 15 mL via OROMUCOSAL

## 2016-05-27 MED ORDER — ORAL CARE MOUTH RINSE
15.0000 mL | Freq: Four times a day (QID) | OROMUCOSAL | Status: DC
  Administered 2016-05-27 – 2016-05-28 (×4): 15 mL via OROMUCOSAL

## 2016-05-27 MED ORDER — FAMOTIDINE IN NACL 20-0.9 MG/50ML-% IV SOLN
20.0000 mg | Freq: Two times a day (BID) | INTRAVENOUS | Status: AC
  Administered 2016-05-27 (×2): 20 mg via INTRAVENOUS
  Filled 2016-05-27: qty 50

## 2016-05-27 MED ORDER — EPHEDRINE 5 MG/ML INJ
INTRAVENOUS | Status: AC
  Filled 2016-05-27: qty 10

## 2016-05-27 MED ORDER — VANCOMYCIN HCL 500 MG IV SOLR
500.0000 mg | Freq: Two times a day (BID) | INTRAVENOUS | Status: AC
  Administered 2016-05-27 – 2016-05-28 (×3): 500 mg via INTRAVENOUS
  Filled 2016-05-27 (×3): qty 500

## 2016-05-27 MED ORDER — FUROSEMIDE 10 MG/ML IJ SOLN
40.0000 mg | Freq: Once | INTRAMUSCULAR | Status: AC
Start: 2016-05-27 — End: 2016-05-27
  Administered 2016-05-27: 40 mg via INTRAVENOUS

## 2016-05-27 MED ORDER — ROCURONIUM BROMIDE 50 MG/5ML IV SOSY
PREFILLED_SYRINGE | INTRAVENOUS | Status: AC
  Filled 2016-05-27: qty 5

## 2016-05-27 MED ORDER — ACETAMINOPHEN 650 MG RE SUPP
650.0000 mg | Freq: Once | RECTAL | Status: AC
  Administered 2016-05-27: 650 mg via RECTAL

## 2016-05-27 MED ORDER — ASPIRIN EC 325 MG PO TBEC
325.0000 mg | DELAYED_RELEASE_TABLET | Freq: Every day | ORAL | Status: DC
  Administered 2016-05-28: 325 mg via ORAL
  Filled 2016-05-27: qty 1

## 2016-05-27 MED ORDER — LACTATED RINGERS IV SOLN
INTRAVENOUS | Status: DC | PRN
  Administered 2016-05-27 (×3): via INTRAVENOUS

## 2016-05-27 MED ORDER — SODIUM CHLORIDE 0.9 % IV SOLN: Freq: Once | INTRAVENOUS | Status: DC

## 2016-05-27 MED ORDER — BISACODYL 5 MG PO TBEC
10.0000 mg | DELAYED_RELEASE_TABLET | Freq: Every day | ORAL | Status: DC
  Administered 2016-05-28 – 2016-06-01 (×5): 10 mg via ORAL
  Filled 2016-05-27 (×14): qty 2

## 2016-05-27 MED ORDER — FENTANYL CITRATE (PF) 250 MCG/5ML IJ SOLN
INTRAMUSCULAR | Status: AC
  Filled 2016-05-27: qty 25

## 2016-05-27 MED ORDER — SODIUM CHLORIDE 0.9% FLUSH: 3.0000 mL | INTRAVENOUS | Status: DC | PRN

## 2016-05-27 MED ORDER — ARTIFICIAL TEARS OP OINT
TOPICAL_OINTMENT | OPHTHALMIC | Status: DC | PRN
  Administered 2016-05-27: 1 via OPHTHALMIC

## 2016-05-27 MED ORDER — DEXTROSE 50 % IV SOLN
INTRAVENOUS | Status: AC
  Filled 2016-05-27: qty 50

## 2016-05-27 MED ORDER — PLASMA-LYTE 148 IV SOLN
INTRAVENOUS | Status: DC | PRN
  Administered 2016-05-27: 400 mL via INTRAVASCULAR

## 2016-05-27 MED ORDER — PROPOFOL 10 MG/ML IV BOLUS
INTRAVENOUS | Status: DC | PRN
  Administered 2016-05-27: 50 mg via INTRAVENOUS

## 2016-05-27 MED ORDER — PROTAMINE SULFATE 10 MG/ML IV SOLN
INTRAVENOUS | Status: DC | PRN
  Administered 2016-05-27: 300 mg via INTRAVENOUS

## 2016-05-27 MED ORDER — SODIUM CHLORIDE 0.9 % IV SOLN
500.0000 mg | Freq: Once | INTRAVENOUS | Status: DC
  Filled 2016-05-27: qty 500

## 2016-05-27 MED ORDER — BISACODYL 10 MG RE SUPP
10.0000 mg | Freq: Every day | RECTAL | Status: DC
Start: 2016-05-28 — End: 2016-06-14

## 2016-05-27 MED ORDER — MIDAZOLAM HCL 5 MG/5ML IJ SOLN
INTRAMUSCULAR | Status: DC | PRN
  Administered 2016-05-27: 1 mg via INTRAVENOUS
  Administered 2016-05-27: 2 mg via INTRAVENOUS
  Administered 2016-05-27: 3 mg via INTRAVENOUS
  Administered 2016-05-27 (×2): 2 mg via INTRAVENOUS

## 2016-05-27 MED ORDER — DEXMEDETOMIDINE HCL IN NACL 200 MCG/50ML IV SOLN
0.0000 ug/kg/h | INTRAVENOUS | Status: DC
  Filled 2016-05-27: qty 50

## 2016-05-27 MED ORDER — SODIUM CHLORIDE 0.9% FLUSH
3.0000 mL | Freq: Two times a day (BID) | INTRAVENOUS | Status: DC
  Administered 2016-05-28 – 2016-05-30 (×4): 3 mL via INTRAVENOUS

## 2016-05-27 MED ORDER — NITROGLYCERIN IN D5W 200-5 MCG/ML-% IV SOLN: 0.0000 ug/min | INTRAVENOUS | Status: DC

## 2016-05-27 MED ORDER — SODIUM CHLORIDE 0.9 % IV SOLN
INTRAVENOUS | Status: DC | PRN
  Administered 2016-05-27: 1 [IU]/h via INTRAVENOUS

## 2016-05-27 MED ORDER — NITROGLYCERIN 0.2 MG/ML ON CALL CATH LAB
INTRAVENOUS | Status: DC | PRN
  Administered 2016-05-27 (×2): 40 ug via INTRAVENOUS

## 2016-05-27 MED ORDER — ETOMIDATE 2 MG/ML IV SOLN
INTRAVENOUS | Status: DC | PRN
  Administered 2016-05-27: 16 mg via INTRAVENOUS

## 2016-05-27 MED ORDER — DOPAMINE-DEXTROSE 3.2-5 MG/ML-% IV SOLN
3.0000 ug/kg/min | INTRAVENOUS | Status: DC
  Administered 2016-05-28: 2 ug/kg/min via INTRAVENOUS
  Administered 2016-05-29: 3 ug/kg/min via INTRAVENOUS
  Filled 2016-05-27 (×2): qty 250

## 2016-05-27 MED ORDER — LACTATED RINGERS IV SOLN: INTRAVENOUS | Status: DC | PRN

## 2016-05-27 MED ORDER — SODIUM CHLORIDE 0.45 % IV SOLN: INTRAVENOUS | Status: DC | PRN

## 2016-05-27 MED ORDER — SODIUM CHLORIDE 0.9 % IV SOLN: 250.0000 mL | INTRAVENOUS | Status: DC

## 2016-05-27 MED ORDER — MORPHINE SULFATE (PF) 2 MG/ML IV SOLN
2.0000 mg | INTRAVENOUS | Status: DC | PRN
  Administered 2016-05-28 – 2016-05-29 (×2): 2 mg via INTRAVENOUS
  Filled 2016-05-27 (×2): qty 2
  Filled 2016-05-27 (×3): qty 1

## 2016-05-27 MED ORDER — METOPROLOL TARTRATE 12.5 MG HALF TABLET
12.5000 mg | ORAL_TABLET | Freq: Two times a day (BID) | ORAL | Status: DC
  Administered 2016-05-28 – 2016-05-31 (×6): 12.5 mg via ORAL
  Filled 2016-05-27 (×6): qty 1

## 2016-05-27 MED ORDER — MIDAZOLAM HCL 10 MG/2ML IJ SOLN
INTRAMUSCULAR | Status: AC
  Filled 2016-05-27: qty 2

## 2016-05-27 MED ORDER — PROPOFOL 10 MG/ML IV BOLUS
INTRAVENOUS | Status: AC
  Filled 2016-05-27: qty 20

## 2016-05-27 SURGICAL SUPPLY — 104 items
ADAPTER CARDIO PERF ANTE/RETRO (ADAPTER) ×4 IMPLANT
BAG DECANTER FOR FLEXI CONT (MISCELLANEOUS) ×4 IMPLANT
BANDAGE ACE 4X5 VEL STRL LF (GAUZE/BANDAGES/DRESSINGS) ×4 IMPLANT
BANDAGE ACE 6X5 VEL STRL LF (GAUZE/BANDAGES/DRESSINGS) ×4 IMPLANT
BASKET HEART  (ORDER IN 25'S) (MISCELLANEOUS) ×1
BASKET HEART (ORDER IN 25'S) (MISCELLANEOUS) ×1
BASKET HEART (ORDER IN 25S) (MISCELLANEOUS) ×2 IMPLANT
BLADE STERNUM SYSTEM 6 (BLADE) ×4 IMPLANT
BLADE SURG 12 STRL SS (BLADE) ×4 IMPLANT
BLADE SURG ROTATE 9660 (MISCELLANEOUS) IMPLANT
BNDG GAUZE ELAST 4 BULKY (GAUZE/BANDAGES/DRESSINGS) ×4 IMPLANT
CANISTER SUCTION 2500CC (MISCELLANEOUS) ×4 IMPLANT
CANNULA GUNDRY RCSP 15FR (MISCELLANEOUS) ×4 IMPLANT
CATH CPB KIT VANTRIGT (MISCELLANEOUS) ×4 IMPLANT
CATH ROBINSON RED A/P 18FR (CATHETERS) ×12 IMPLANT
CATH THORACIC 36FR RT ANG (CATHETERS) ×4 IMPLANT
CLIP TI WIDE RED SMALL 24 (CLIP) ×4 IMPLANT
CRADLE DONUT ADULT HEAD (MISCELLANEOUS) ×4 IMPLANT
DRAIN CHANNEL 32F RND 10.7 FF (WOUND CARE) ×4 IMPLANT
DRAPE CARDIOVASCULAR INCISE (DRAPES) ×2
DRAPE SLUSH/WARMER DISC (DRAPES) ×4 IMPLANT
DRAPE SRG 135X102X78XABS (DRAPES) ×2 IMPLANT
DRSG AQUACEL AG ADV 3.5X14 (GAUZE/BANDAGES/DRESSINGS) ×4 IMPLANT
ELECT BLADE 4.0 EZ CLEAN MEGAD (MISCELLANEOUS) ×4
ELECT BLADE 6.5 EXT (BLADE) ×4 IMPLANT
ELECT CAUTERY BLADE 6.4 (BLADE) ×4 IMPLANT
ELECT REM PT RETURN 9FT ADLT (ELECTROSURGICAL) ×8
ELECTRODE BLDE 4.0 EZ CLN MEGD (MISCELLANEOUS) ×2 IMPLANT
ELECTRODE REM PT RTRN 9FT ADLT (ELECTROSURGICAL) ×4 IMPLANT
FELT TEFLON 1X6 (MISCELLANEOUS) ×8 IMPLANT
GAUZE SPONGE 4X4 12PLY STRL (GAUZE/BANDAGES/DRESSINGS) ×8 IMPLANT
GLOVE BIO SURGEON STRL SZ7.5 (GLOVE) ×12 IMPLANT
GLOVE BIOGEL M 7.0 STRL (GLOVE) ×16 IMPLANT
GLOVE BIOGEL M STRL SZ7.5 (GLOVE) ×8 IMPLANT
GLOVE BIOGEL PI IND STRL 6 (GLOVE) ×6 IMPLANT
GLOVE BIOGEL PI IND STRL 6.5 (GLOVE) ×4 IMPLANT
GLOVE BIOGEL PI IND STRL 7.0 (GLOVE) ×4 IMPLANT
GLOVE BIOGEL PI INDICATOR 6 (GLOVE) ×6
GLOVE BIOGEL PI INDICATOR 6.5 (GLOVE) ×4
GLOVE BIOGEL PI INDICATOR 7.0 (GLOVE) ×4
GOWN STRL REUS W/ TWL LRG LVL3 (GOWN DISPOSABLE) ×20 IMPLANT
GOWN STRL REUS W/TWL LRG LVL3 (GOWN DISPOSABLE) ×20
HEMOSTAT POWDER SURGIFOAM 1G (HEMOSTASIS) ×12 IMPLANT
HEMOSTAT SURGICEL 2X14 (HEMOSTASIS) ×4 IMPLANT
INSERT FOGARTY XLG (MISCELLANEOUS) IMPLANT
KIT BASIN OR (CUSTOM PROCEDURE TRAY) ×4 IMPLANT
KIT ROOM TURNOVER OR (KITS) ×4 IMPLANT
KIT SUCTION CATH 14FR (SUCTIONS) ×4 IMPLANT
KIT VASOVIEW HEMOPRO VH 3000 (KITS) ×4 IMPLANT
LEAD PACING MYOCARDI (MISCELLANEOUS) ×4 IMPLANT
MARKER GRAFT CORONARY BYPASS (MISCELLANEOUS) ×12 IMPLANT
NS IRRIG 1000ML POUR BTL (IV SOLUTION) ×24 IMPLANT
PACK OPEN HEART (CUSTOM PROCEDURE TRAY) ×4 IMPLANT
PAD ARMBOARD 7.5X6 YLW CONV (MISCELLANEOUS) ×8 IMPLANT
PAD ELECT DEFIB RADIOL ZOLL (MISCELLANEOUS) ×4 IMPLANT
PENCIL BUTTON HOLSTER BLD 10FT (ELECTRODE) ×4 IMPLANT
PUNCH AORTIC ROTATE  4.5MM 8IN (MISCELLANEOUS) ×4 IMPLANT
PUNCH AORTIC ROTATE 4.0MM (MISCELLANEOUS) IMPLANT
PUNCH AORTIC ROTATE 4.5MM 8IN (MISCELLANEOUS) IMPLANT
PUNCH AORTIC ROTATE 5MM 8IN (MISCELLANEOUS) IMPLANT
SENSOR MYOCARDIAL TEMP (MISCELLANEOUS) ×4 IMPLANT
SET CARDIOPLEGIA MPS 5001102 (MISCELLANEOUS) ×4 IMPLANT
SHEATH BRITE TIP 8FR 23CM (MISCELLANEOUS) IMPLANT
SPONGE LAP 18X18 X RAY DECT (DISPOSABLE) ×8 IMPLANT
SPONGE LAP 4X18 X RAY DECT (DISPOSABLE) ×4 IMPLANT
STOPCOCK 4 WAY LG BORE MALE ST (IV SETS) ×4 IMPLANT
SURGIFLO W/THROMBIN 8M KIT (HEMOSTASIS) ×4 IMPLANT
SUT BONE WAX W31G (SUTURE) ×4 IMPLANT
SUT MNCRL AB 4-0 PS2 18 (SUTURE) IMPLANT
SUT PROLENE 3 0 SH DA (SUTURE) IMPLANT
SUT PROLENE 3 0 SH1 36 (SUTURE) IMPLANT
SUT PROLENE 4 0 RB 1 (SUTURE) ×2
SUT PROLENE 4 0 SH DA (SUTURE) ×4 IMPLANT
SUT PROLENE 4-0 RB1 .5 CRCL 36 (SUTURE) ×2 IMPLANT
SUT PROLENE 5 0 C 1 36 (SUTURE) ×4 IMPLANT
SUT PROLENE 6 0 C 1 30 (SUTURE) IMPLANT
SUT PROLENE 6 0 CC (SUTURE) ×12 IMPLANT
SUT PROLENE 8 0 BV175 6 (SUTURE) ×12 IMPLANT
SUT PROLENE BLUE 7 0 (SUTURE) ×8 IMPLANT
SUT SILK  1 MH (SUTURE)
SUT SILK 1 MH (SUTURE) IMPLANT
SUT SILK 2 0 SH CR/8 (SUTURE) ×4 IMPLANT
SUT SILK 3 0 SH CR/8 (SUTURE) IMPLANT
SUT STEEL 6MS V (SUTURE) ×4 IMPLANT
SUT STEEL SZ 6 DBL 3X14 BALL (SUTURE) ×4 IMPLANT
SUT VIC AB 1 CTX 36 (SUTURE) ×4
SUT VIC AB 1 CTX36XBRD ANBCTR (SUTURE) ×4 IMPLANT
SUT VIC AB 2-0 CT1 27 (SUTURE) ×2
SUT VIC AB 2-0 CT1 TAPERPNT 27 (SUTURE) ×2 IMPLANT
SUT VIC AB 2-0 CTX 27 (SUTURE) IMPLANT
SUT VIC AB 3-0 X1 27 (SUTURE) ×4 IMPLANT
SUTURE E-PAK OPEN HEART (SUTURE) ×4 IMPLANT
SYSTEM SAHARA CHEST DRAIN ATS (WOUND CARE) ×4 IMPLANT
TAPE CLOTH SURG 4X10 WHT LF (GAUZE/BANDAGES/DRESSINGS) ×4 IMPLANT
TAPE PAPER 2X10 WHT MICROPORE (GAUZE/BANDAGES/DRESSINGS) ×4 IMPLANT
TOWEL OR 17X24 6PK STRL BLUE (TOWEL DISPOSABLE) ×8 IMPLANT
TOWEL OR 17X26 10 PK STRL BLUE (TOWEL DISPOSABLE) ×8 IMPLANT
TRAY CATH LUMEN 1 20CM STRL (SET/KITS/TRAYS/PACK) ×4 IMPLANT
TRAY FOLEY IC TEMP SENS 16FR (CATHETERS) ×4 IMPLANT
TUBING ART PRESS 72  MALE/FEM (TUBING) ×4
TUBING ART PRESS 72 MALE/FEM (TUBING) ×4 IMPLANT
TUBING INSUFFLATION (TUBING) ×4 IMPLANT
UNDERPAD 30X30 (UNDERPADS AND DIAPERS) ×4 IMPLANT
WATER STERILE IRR 1000ML POUR (IV SOLUTION) ×8 IMPLANT

## 2016-05-27 NOTE — Progress Notes (Signed)
Weaning initiated RR 4 40%. Tolerating it well no distress noted.

## 2016-05-27 NOTE — Transfer of Care (Signed)
Immediate Anesthesia Transfer of Care Note  Patient: Theresa Rogers  Procedure(s) Performed: Procedure(s): CORONARY ARTERY BYPASS GRAFTING (CABG) x3 using left internal mammary artery and left greater saphenous vein harvested endoscopically (N/A) TRANSESOPHAGEAL ECHOCARDIOGRAM (TEE) (N/A)  Patient Location: ICU  Anesthesia Type:General  Level of Consciousness: sedated and Patient remains intubated per anesthesia plan  Airway & Oxygen Therapy: Patient remains intubated per anesthesia plan and Patient placed on Ventilator (see vital sign flow sheet for setting)  Post-op Assessment: Report given to RN and Post -op Vital signs reviewed and stable  Post vital signs: Reviewed and stable  Last Vitals:  BP 103/68 HR 90 (paced AOO) RR see vent flowsheet PAD 19 SpO2 96% on 50%FiO2 T 36.5  Last Pain:  Vitals:   05/27/16 0400  TempSrc: Oral  PainSc: 0-No pain         Complications: No apparent anesthesia complications

## 2016-05-27 NOTE — Progress Notes (Signed)
Weaning per protocol. Pt is now on CPAP/PS tolerating it well. RN bedside.

## 2016-05-27 NOTE — Progress Notes (Signed)
The patient was examined and preop studies reviewed. There has been no change from the prior exam and the patient is ready for surgery.  plan CABG on L Theresa Rogers for severe CAD and LV dysfunction

## 2016-05-27 NOTE — Progress Notes (Signed)
Pt passed pulmonary mechanics NIF -24 on second attempt, first was -16 with poor effort, VC 974ml. Pt was unable to hold her head off the pillow for even a second. Possible anesthesia still in pt upper body. Pt still appears very weak, pt is able to follow commands with some hesitation. Pt does have a small cuff leak. Not as audible as I would like but there is one.  Holding off on Extubation per RRT/ protocol for unable to pass all weaning criteria.  Pt  will be reassess for weaning criteria later. Pt will undergo resting at this time.

## 2016-05-27 NOTE — Progress Notes (Signed)
CT surgery p.m. Rounds  Patient recovering from CABG today for her severe CAD and ischemic cardiomyopathy She remains on ventilator sedated but with stable hemodynamics and cardiac index greater than 2.0 Chest tube drainage less than 40 cc/h P.m. labs at 8 PM We'll DC femoral A-line this p.m.

## 2016-05-27 NOTE — Progress Notes (Signed)
Report called to OR Pre-Op. Patient waiting patiently for stretcher to get here to take her to OR. Valium 5mg  given PO this morning. Family in room with patient. All questions answered and Pre-Op scrubs and clipping finished. Milrinone infusing at scheduled dose. All personal belongings given to family member. VSS. Patient ready for OR.

## 2016-05-27 NOTE — Anesthesia Procedure Notes (Signed)
Procedures

## 2016-05-27 NOTE — Anesthesia Procedure Notes (Signed)
Anesthesia Procedure Image      R internal jugular access

## 2016-05-27 NOTE — Anesthesia Procedure Notes (Signed)
Procedure Name: Intubation Date/Time: 05/27/2016 7:50 AM Performed by: Willeen Cass P Pre-anesthesia Checklist: Patient identified, Emergency Drugs available, Suction available and Patient being monitored Patient Re-evaluated:Patient Re-evaluated prior to inductionOxygen Delivery Method: Circle system utilized Preoxygenation: Pre-oxygenation with 100% oxygen Intubation Type: IV induction Ventilation: Mask ventilation without difficulty and Oral airway inserted - appropriate to patient size Laryngoscope Size: Sabra Heck and 2 Grade View: Grade I Tube type: Oral Tube size: 8.0 mm Number of attempts: 1 Airway Equipment and Method: Stylet Placement Confirmation: ETT inserted through vocal cords under direct vision,  positive ETCO2 and breath sounds checked- equal and bilateral Secured at: 23 cm Tube secured with: Tape Dental Injury: Teeth and Oropharynx as per pre-operative assessment

## 2016-05-27 NOTE — Brief Op Note (Signed)
05/20/2016 - 05/27/2016  11:48 AM  PATIENT:  Theresa Rogers  65 y.o. female  PRE-OPERATIVE DIAGNOSIS:  CAD HEART FAILURE  POST-OPERATIVE DIAGNOSIS:  CAD HEART FAILURE  PROCEDURE:  Procedure(s): CORONARY ARTERY BYPASS GRAFTING (CABG) x3 using left internal mammary artery and left greater saphenous vein harvested endoscopically (N/A) TRANSESOPHAGEAL ECHOCARDIOGRAM (TEE) (N/A) LIMA-LAD SVG-DIAG SVG-RCA  SURGEON:  Surgeon(s) and Role:    * Ivin Poot, MD - Primary  PHYSICIAN ASSISTANT: Othella Slappey PA-C  ANESTHESIA:   general  EBL:  Total I/O In: -  Out: 220 [Urine:220]  BLOOD ADMINISTERED:2 FFP and 1 PLTS  DRAINS: ROUTINE   LOCAL MEDICATIONS USED:  NONE  SPECIMEN:  No Specimen  DISPOSITION OF SPECIMEN:  N/A  COUNTS:  YES  TOURNIQUET:  * No tourniquets in log *  DICTATION: .Other Dictation: Dictation Number PENDING  PLAN OF CARE: Admit to inpatient   PATIENT DISPOSITION:  ICU - intubated and hemodynamically stable.   Delay start of Pharmacological VTE agent (>24hrs) due to surgical blood loss or risk of bleeding: yes  COMPLICATIONS: NO KNOWN

## 2016-05-27 NOTE — Anesthesia Preprocedure Evaluation (Signed)
Anesthesia Evaluation  Patient identified by MRN, date of birth, ID band Patient awake    Reviewed: Allergy & Precautions, NPO status , Patient's Chart, lab work & pertinent test results  Airway Mallampati: II   Neck ROM: full    Dental   Pulmonary shortness of breath,    breath sounds clear to auscultation       Cardiovascular hypertension, + angina + CAD, + Past MI, + CABG, + Peripheral Vascular Disease and +CHF   Rhythm:regular Rate:Normal     Neuro/Psych    GI/Hepatic   Endo/Other  diabetes, Type 2  Renal/GU Renal InsufficiencyRenal disease     Musculoskeletal   Abdominal   Peds  Hematology  (+) anemia ,   Anesthesia Other Findings   Reproductive/Obstetrics                             Anesthesia Physical Anesthesia Plan  ASA: III  Anesthesia Plan: General   Post-op Pain Management:    Induction: Intravenous  Airway Management Planned: Oral ETT  Additional Equipment: Arterial line, CVP, PA Cath, TEE and Ultrasound Guidance Line Placement  Intra-op Plan:   Post-operative Plan: Post-operative intubation/ventilation  Informed Consent: I have reviewed the patients History and Physical, chart, labs and discussed the procedure including the risks, benefits and alternatives for the proposed anesthesia with the patient or authorized representative who has indicated his/her understanding and acceptance.     Plan Discussed with: CRNA, Anesthesiologist and Surgeon  Anesthesia Plan Comments:         Anesthesia Quick Evaluation

## 2016-05-28 ENCOUNTER — Inpatient Hospital Stay (HOSPITAL_COMMUNITY): Payer: Medicaid Other

## 2016-05-28 ENCOUNTER — Encounter (HOSPITAL_COMMUNITY): Payer: Self-pay | Admitting: Cardiothoracic Surgery

## 2016-05-28 DIAGNOSIS — I214 Non-ST elevation (NSTEMI) myocardial infarction: Principal | ICD-10-CM

## 2016-05-28 LAB — PREPARE PLATELET PHERESIS: Unit division: 0

## 2016-05-28 LAB — POCT I-STAT 3, ART BLOOD GAS (G3+)
Acid-Base Excess: 1 mmol/L (ref 0.0–2.0)
Acid-Base Excess: 2 mmol/L (ref 0.0–2.0)
BICARBONATE: 25.9 mmol/L (ref 20.0–28.0)
BICARBONATE: 28.5 mmol/L — AB (ref 20.0–28.0)
O2 Saturation: 96 %
O2 Saturation: 99 %
PCO2 ART: 50.8 mmHg — AB (ref 32.0–48.0)
Patient temperature: 36.2
Patient temperature: 36.6
TCO2: 27 mmol/L (ref 0–100)
TCO2: 30 mmol/L (ref 0–100)
pCO2 arterial: 39.4 mmHg (ref 32.0–48.0)
pH, Arterial: 7.353 (ref 7.350–7.450)
pH, Arterial: 7.425 (ref 7.350–7.450)
pO2, Arterial: 126 mmHg — ABNORMAL HIGH (ref 83.0–108.0)
pO2, Arterial: 77 mmHg — ABNORMAL LOW (ref 83.0–108.0)

## 2016-05-28 LAB — GLUCOSE, CAPILLARY
GLUCOSE-CAPILLARY: 108 mg/dL — AB (ref 65–99)
GLUCOSE-CAPILLARY: 116 mg/dL — AB (ref 65–99)
GLUCOSE-CAPILLARY: 117 mg/dL — AB (ref 65–99)
GLUCOSE-CAPILLARY: 119 mg/dL — AB (ref 65–99)
GLUCOSE-CAPILLARY: 120 mg/dL — AB (ref 65–99)
GLUCOSE-CAPILLARY: 127 mg/dL — AB (ref 65–99)
GLUCOSE-CAPILLARY: 128 mg/dL — AB (ref 65–99)
GLUCOSE-CAPILLARY: 139 mg/dL — AB (ref 65–99)
GLUCOSE-CAPILLARY: 162 mg/dL — AB (ref 65–99)
Glucose-Capillary: 121 mg/dL — ABNORMAL HIGH (ref 65–99)
Glucose-Capillary: 122 mg/dL — ABNORMAL HIGH (ref 65–99)
Glucose-Capillary: 122 mg/dL — ABNORMAL HIGH (ref 65–99)
Glucose-Capillary: 129 mg/dL — ABNORMAL HIGH (ref 65–99)
Glucose-Capillary: 132 mg/dL — ABNORMAL HIGH (ref 65–99)
Glucose-Capillary: 132 mg/dL — ABNORMAL HIGH (ref 65–99)
Glucose-Capillary: 171 mg/dL — ABNORMAL HIGH (ref 65–99)
Glucose-Capillary: 222 mg/dL — ABNORMAL HIGH (ref 65–99)

## 2016-05-28 LAB — CBC
HEMATOCRIT: 28.4 % — AB (ref 36.0–46.0)
HEMATOCRIT: 28.3 % — AB (ref 36.0–46.0)
HEMOGLOBIN: 9.2 g/dL — AB (ref 12.0–15.0)
Hemoglobin: 9.1 g/dL — ABNORMAL LOW (ref 12.0–15.0)
MCH: 26.7 pg (ref 26.0–34.0)
MCH: 26.7 pg (ref 26.0–34.0)
MCHC: 32.4 g/dL (ref 30.0–36.0)
MCHC: 32.2 g/dL (ref 30.0–36.0)
MCV: 82.6 fL (ref 78.0–100.0)
MCV: 83 fL (ref 78.0–100.0)
Platelets: 296 10*3/uL (ref 150–400)
Platelets: 302 10*3/uL (ref 150–400)
RBC: 3.44 MIL/uL — ABNORMAL LOW (ref 3.87–5.11)
RBC: 3.41 MIL/uL — ABNORMAL LOW (ref 3.87–5.11)
RDW: 15.4 % (ref 11.5–15.5)
RDW: 15.7 % — AB (ref 11.5–15.5)
WBC: 13.2 10*3/uL — ABNORMAL HIGH (ref 4.0–10.5)
WBC: 16.8 10*3/uL — AB (ref 4.0–10.5)

## 2016-05-28 LAB — POCT I-STAT, CHEM 8
BUN: 44 mg/dL — AB (ref 6–20)
CALCIUM ION: 1.21 mmol/L (ref 1.15–1.40)
CREATININE: 2.1 mg/dL — AB (ref 0.44–1.00)
Chloride: 102 mmol/L (ref 101–111)
GLUCOSE: 171 mg/dL — AB (ref 65–99)
HCT: 26 % — ABNORMAL LOW (ref 36.0–46.0)
HEMOGLOBIN: 8.8 g/dL — AB (ref 12.0–15.0)
Potassium: 4.3 mmol/L (ref 3.5–5.1)
Sodium: 138 mmol/L (ref 135–145)
TCO2: 26 mmol/L (ref 0–100)

## 2016-05-28 LAB — BASIC METABOLIC PANEL
ANION GAP: 8 (ref 5–15)
BUN: 43 mg/dL — ABNORMAL HIGH (ref 6–20)
CALCIUM: 8.4 mg/dL — AB (ref 8.9–10.3)
CHLORIDE: 104 mmol/L (ref 101–111)
CO2: 27 mmol/L (ref 22–32)
CREATININE: 1.71 mg/dL — AB (ref 0.44–1.00)
GFR calc non Af Amer: 30 mL/min — ABNORMAL LOW (ref >60)
GFR, EST AFRICAN AMERICAN: 35 mL/min — AB (ref >60)
GLUCOSE: 134 mg/dL — AB (ref 65–99)
Potassium: 4.3 mmol/L (ref 3.5–5.1)
Sodium: 139 mmol/L (ref 135–145)

## 2016-05-28 LAB — PREPARE FRESH FROZEN PLASMA
UNIT DIVISION: 0
Unit division: 0

## 2016-05-28 LAB — COOXEMETRY PANEL
Carboxyhemoglobin: 1.1 % (ref 0.5–1.5)
Methemoglobin: 1.1 % (ref 0.0–1.5)
O2 SAT: 98.7 %
Total hemoglobin: 9.5 g/dL — ABNORMAL LOW (ref 12.0–16.0)

## 2016-05-28 LAB — CREATININE, SERUM
Creatinine, Ser: 2.36 mg/dL — ABNORMAL HIGH (ref 0.44–1.00)
GFR, EST AFRICAN AMERICAN: 24 mL/min — AB (ref >60)
GFR, EST NON AFRICAN AMERICAN: 21 mL/min — AB (ref >60)

## 2016-05-28 LAB — MAGNESIUM
MAGNESIUM: 2.8 mg/dL — AB (ref 1.7–2.4)
Magnesium: 2.8 mg/dL — ABNORMAL HIGH (ref 1.7–2.4)

## 2016-05-28 MED ORDER — INSULIN ASPART 100 UNIT/ML ~~LOC~~ SOLN
0.0000 [IU] | SUBCUTANEOUS | Status: DC
  Administered 2016-05-28: 4 [IU] via SUBCUTANEOUS
  Administered 2016-05-28: 8 [IU] via SUBCUTANEOUS
  Administered 2016-05-28: 4 [IU] via SUBCUTANEOUS
  Administered 2016-05-29 (×3): 2 [IU] via SUBCUTANEOUS
  Administered 2016-05-29: 12 [IU] via SUBCUTANEOUS

## 2016-05-28 MED ORDER — GUAIFENESIN ER 600 MG PO TB12
600.0000 mg | ORAL_TABLET | Freq: Two times a day (BID) | ORAL | Status: DC
Start: 2016-05-28 — End: 2016-06-13
  Administered 2016-05-28 – 2016-06-07 (×19): 600 mg via ORAL
  Filled 2016-05-28 (×26): qty 1

## 2016-05-28 MED ORDER — METOCLOPRAMIDE HCL 5 MG/ML IJ SOLN
10.0000 mg | Freq: Four times a day (QID) | INTRAMUSCULAR | Status: DC
  Administered 2016-05-28 – 2016-06-03 (×22): 10 mg via INTRAVENOUS
  Filled 2016-05-28 (×25): qty 2

## 2016-05-28 MED ORDER — FUROSEMIDE 10 MG/ML IJ SOLN
40.0000 mg | Freq: Once | INTRAMUSCULAR | Status: AC
  Administered 2016-05-28: 40 mg via INTRAVENOUS

## 2016-05-28 MED ORDER — INSULIN DETEMIR 100 UNIT/ML ~~LOC~~ SOLN
10.0000 [IU] | Freq: Two times a day (BID) | SUBCUTANEOUS | Status: DC
  Administered 2016-05-28 – 2016-05-31 (×5): 10 [IU] via SUBCUTANEOUS
  Filled 2016-05-28 (×7): qty 0.1

## 2016-05-28 MED ORDER — FUROSEMIDE 10 MG/ML IJ SOLN
40.0000 mg | Freq: Once | INTRAMUSCULAR | Status: AC
  Filled 2016-05-28: qty 4

## 2016-05-28 MED ORDER — PROMETHAZINE HCL 25 MG/ML IJ SOLN
12.5000 mg | Freq: Four times a day (QID) | INTRAMUSCULAR | Status: DC | PRN
  Administered 2016-05-28 – 2016-06-04 (×2): 12.5 mg via INTRAVENOUS
  Filled 2016-05-28 (×2): qty 1

## 2016-05-28 MED FILL — Heparin Sodium (Porcine) Inj 1000 Unit/ML: INTRAMUSCULAR | Qty: 30 | Status: AC

## 2016-05-28 MED FILL — Potassium Chloride Inj 2 mEq/ML: INTRAVENOUS | Qty: 40 | Status: AC

## 2016-05-28 MED FILL — Magnesium Sulfate Inj 50%: INTRAMUSCULAR | Qty: 10 | Status: AC

## 2016-05-28 NOTE — Progress Notes (Signed)
Attempted to wean patient.  Patient did not meet parameters: unable to hold up head; unable to maintain spontaneous breaths.  Patient put back on vent support, will allow patient to rest and will attempt to extubate in early AM.  Clyda Hurdle RN

## 2016-05-28 NOTE — Progress Notes (Signed)
Patient ID: Theresa Rogers, female   DOB: April 18, 1951, 65 y.o.   MRN: [id number] EVENING ROUNDS NOTE :     [address]       [place] [address]             [phone]                 1 Day Post-Op Procedure(s) (LRB): CORONARY ARTERY BYPASS GRAFTING (CABG) x3 using left internal mammary artery and left greater saphenous vein harvested endoscopically (N/A) TRANSESOPHAGEAL ECHOCARDIOGRAM (TEE) (N/A)  Total Length of Stay:  LOS: 8 days  BP (!) 101/53   Pulse 85   Temp 97.8 F (36.6 C) (Oral)   Resp 17   Ht 5\' 4"  (1.626 m)   Wt 149 lb 11.1 oz (67.9 kg)   SpO2 100%   BMI 25.69 kg/m   .Intake/Output      12/18 0701 - 12/19 0700 12/19 0701 - 12/20 0700   P.O.     I.V. (mL/kg) 3057.1 (45) 465.2 (6.9)   Blood 1239    IV Piggyback 100    Total Intake(mL/kg) 4396.1 (64.7) 465.2 (6.9)   Urine (mL/kg/hr) 1005 (0.6) 120 (0.2)   Emesis/NG output 0 (0)    Blood 1400 (0.9)    Chest Tube 480 (0.3) 110 (0.1)   Total Output 2885 230   Net +1511.1 +235.2          . sodium chloride    . sodium chloride    . sodium chloride 20 mL/hr at 05/28/16 1600  . DOPamine 2.5 mcg/kg/min (05/28/16 1742)  . insulin (NOVOLIN-R) infusion Stopped (05/28/16 1405)  . lactated ringers    . lactated ringers 20 mL/hr at 05/28/16 1600  . milrinone 0.3 mcg/kg/min (05/28/16 1600)  . nitroGLYCERIN Stopped (05/28/16 0700)  . phenylephrine (NEO-SYNEPHRINE) Adult infusion Stopped (05/28/16 0513)     Lab Results  Component Value Date   WBC 13.2 (H) 05/28/2016   HGB 9.2 (L) 05/28/2016   HCT 28.4 (L) 05/28/2016   PLT 296 05/28/2016   GLUCOSE 134 (H) 05/28/2016   CHOL 222 (H) 06/21/2015   TRIG 162 (H) 06/21/2015   HDL 41 06/21/2015   LDLCALC 149 (H) 06/21/2015   ALT 25 05/26/2016   AST 34 05/26/2016   NA 139 05/28/2016   K 4.3 05/28/2016   CL 104 05/28/2016   CREATININE 1.71 (H) 05/28/2016   BUN 43 (H) 05/28/2016   CO2 27 05/28/2016   TSH 8.764 (H) 05/22/2016   INR 1.31 05/27/2016    HGBA1C 11.2 (H) 05/22/2016   Still on dopamine and milrinone  Stable day  TSH elevated - not on thyroid replacement preop  Grace Isaac MD  Beeper [phone] Office [phone] 05/28/2016 5:58 PM

## 2016-05-28 NOTE — Procedures (Signed)
Extubation Procedure Note  Patient Details:   Name: Theresa Rogers DOB: 04/08/1951 MRN: [id number]   Airway Documentation:     Evaluation  O2 sats: stable throughout Complications: No apparent complications Patient did tolerate procedure well. Bilateral Breath Sounds: Clear   Yes  Instructions of what to expect was given to patient prior to extubation. Patient nodded her head for understanding. Pt was extubated without complications. Pt was titrated to a 4L O2 humidity provided Nasal Cannula. Pt has a very strong cough post extubation. Pt able to state her name post extubation. No distress or complications noted.   Leigh Aurora, BS, RRT, RCP 05/28/2016, 5:23 AM

## 2016-05-28 NOTE — Progress Notes (Signed)
Pt is back on a Rate and tolerating it well.

## 2016-05-28 NOTE — Progress Notes (Signed)
Pt is now more awake and able to perform task without complications noted. Pt was able to lift head off the bed for greater than 5 seconds. ABG is good. PH 7.40. Pulmonary mechanics were reassess and achieved. Pt has an audible cuff leak   NIF: -32 VC: 1.3L

## 2016-05-28 NOTE — Progress Notes (Signed)
1 Day Post-Op Procedure(s) (LRB): CORONARY ARTERY BYPASS GRAFTING (CABG) x3 using left internal mammary artery and left greater saphenous vein harvested endoscopically (N/A) TRANSESOPHAGEAL ECHOCARDIOGRAM (TEE) (N/A) Subjective: CABG for ischemic cardiomyopathy, heart failure Preoperative chronic renal failure baseline creatinine 1.7 Patient with postoperative nausea Postoperative urine output has been low  bjective: Vital signs in last 24 hours: Temp:  [96.4 F (35.8 C)-97.9 F (36.6 C)] 97.8 F (36.6 C) (12/19 1145) Pulse Rate:  [58-86] 85 (12/19 1530) Cardiac Rhythm: Normal sinus rhythm (12/19 1600) Resp:  [10-28] 17 (12/19 1530) BP: (82-136)/(51-80) 101/53 (12/19 1530) SpO2:  [92 %-100 %] 100 % (12/19 1530) Arterial Line BP: (100-172)/(55-80) 136/66 (12/19 0615) FiO2 (%):  [40 %-50 %] 40 % (12/19 0501) Weight:  [149 lb 11.1 oz (67.9 kg)] 149 lb 11.1 oz (67.9 kg) (12/19 0630)  Hemodynamic parameters for last 24 hours: PAP: (23-49)/(10-28) 33/17 CO:  [3.3 L/min-4 L/min] 3.9 L/min CI:  [1.9 L/min/m2-2.3 L/min/m2] 2.3 L/min/m2  Intake/Output from previous day: 12/18 0701 - 12/19 0700 In: 4396.1 [I.V.:3057.1; Blood:1239; IV Piggyback:100] Out: 2885 [Urine:1005; Blood:1400; Chest Tube:480] Intake/Output this shift: Total I/O In: 465.2 [I.V.:465.2] Out: 230 [Urine:120; Chest Tube:110]       Exam    General- alert and comfortable   Lungs- clear without rales, wheezes   Cor- regular rate and rhythm, no murmur , gallop   Abdomen- soft, non-tender   Extremities - warm, non-tender, minimal edema   Neuro- oriented, appropriate, no focal weakness   Lab Results:  Recent Labs  05/27/16 2000 05/27/16 2027 05/28/16 0420  WBC 13.8*  --  13.2*  HGB 9.6* 9.9* 9.2*  HCT 29.7* 29.0* 28.4*  PLT 257  --  296   BMET:  Recent Labs  05/27/16 0439  05/27/16 2027 05/28/16 0420  NA 134*  < > 136 139  K 4.2  < > 4.6 4.3  CL 95*  < > 103 104  CO2 32  --   --  27  GLUCOSE  208*  < > 156* 134*  BUN 51*  < > 40* 43*  CREATININE 1.70*  < > 1.40* 1.71*  CALCIUM 8.4*  --   --  8.4*  < > = values in this interval not displayed.  PT/INR:  Recent Labs  05/27/16 1312  LABPROT 16.4*  INR 1.31   ABG    Component Value Date/Time   PHART 7.425 05/28/2016 0512   HCO3 25.9 05/28/2016 0512   TCO2 27 05/28/2016 0512   ACIDBASEDEF 1.0 05/27/2016 1417   O2SAT 98.7 05/28/2016 1148   CBG (last 3)   Recent Labs  05/28/16 1147 05/28/16 1307 05/28/16 1601  GLUCAP 117* 120* 162*    Assessment/Plan: S/P Procedure(s) (LRB): CORONARY ARTERY BYPASS GRAFTING (CABG) x3 using left internal mammary artery and left greater saphenous vein harvested endoscopically (N/A) TRANSESOPHAGEAL ECHOCARDIOGRAM (TEE) (N/A) And renal dose dopamine and continue to follow creatinine   mobilize patient out of bed today Diabetes controlled with Levemir insulin plus sliding scale   LOS: 8 days    Theresa Rogers 05/28/2016

## 2016-05-28 NOTE — Op Note (Signed)
Theresa Rogers, HOGAN NO.:  [id number]  MEDICAL RECORD NO.:  [id number]  LOCATION:  [id number]                       FACILITY:  [facility]  PHYSICIAN:  Ivin Poot, M.D.  DATE OF BIRTH:  09/21/1950  DATE OF PROCEDURE:  05/27/2016 DATE OF DISCHARGE:                              OPERATIVE REPORT   OPERATION: 1. Coronary artery bypass grafting x3 (left internal mammary artery to     left anterior descending, saphenous vein graft to diagonal,     saphenous vein graft to distal right coronary artery). 2. Endoscopic harvest of left leg greater saphenous vein. 3. Placement of left femoral A-line for blood pressure monitoring.  SURGEON:  Ivin Poot, MD.  ASSISTANT:  John Giovanni, PA-C.  PREOPERATIVE DIAGNOSES:  Class 4 congestive heart failure, ischemic cardiomyopathy, severe three-vessel coronary artery disease with a diabetic pattern.  POSTOPERATIVE DIAGNOSES:  Class 4 congestive heart failure, ischemic cardiomyopathy, severe three-vessel coronary artery disease with a diabetic pattern.  ANESTHESIA:  General by Dr. Albertha Ghee.  CLINICAL NOTE:  The patient is a 65 year old obese, poorly controlled diabetic with previous history of CAD-she had a PCI of her circumflex at [facility] earlier in the year.  She re-presented with symptoms of heart failure and fluid overload and had positive cardiac enzymes. Echo showed poor LV function with ejection fraction of 20%.  Mixed venous saturation was low consistent with poor cardiac output.  Her creatinine was elevated.  After she was optimized, she underwent repeat coronary arteriograms, which showed patent stent-PCI to the circumflex, but high-grade stenosis 95% of the LAD, diagonal, and 95% stenosis of the distal RCA.  LVEDP was 35 mmHg, and she was transferred to this hospital to the Heart Failure Service for Milrinone diuresis and preoperative optimization.  I examined the patient in her CPU room  and reviewed the results of the most recent cardiac cath and echo with the patient.  I discussed the indications and expected benefits of coronary artery bypass surgery for treatment of her ischemic cardiomyopathy and symptoms of advanced heart failure.  I discussed the major aspects of the surgery including the use of general anesthesia and cardiopulmonary bypass, the expected hospital recovery, and the potential risks.  We discussed the risks of stroke, MI, bleeding requiring blood transfusion, postoperative ventilator dependence, pneumonia and pleural effusion, postoperative infection, postoperative renal failure requiring dialysis, and death.  After reviewing these issues, she demonstrated understanding under what I felt was an informed consent.  She agreed to proceed with surgery.  We held her Plavix for 6 days prior to surgery.  OPERATIVE PROCEDURE:  The patient was brought to the operating room and placed supine on the operating table where general anesthesia was induced under invasive hemodynamic monitoring.  A transesophageal echo probe was placed by the anesthesiologist.  This confirmed the preoperative diagnosis of ischemic cardiomyopathy without significant MR.  The patient was prepped and draped as a sterile field.  A proper time-out was performed.  A left femoral A-line was placed and secured. A sternotomy was performed as the saphenous vein was harvested endoscopically from the left leg.  The internal mammary artery was harvested as a pedicle graft from its origin at the  subclavian vessels. It was a 1.5-mm vessel with good flow.  The sternal retractor was placed and the pericardium was opened.  There was a large pericardial effusion. Both pleural spaces were drained of fairly large pleural effusions.  The pursestrings for cardiopulmonary bypass were placed in the ascending aorta and right atrium.  After the vein was harvested and heparin was administered, the ACT was  documented as being therapeutic.  The patient was then cannulated and placed on cardiopulmonary bypass.  The coronaries were identified for grafting and cardioplegia cannulas were placed for both antegrade and retrograde cold blood cardioplegia.  The patient was cooled to 32 degrees and an aortic cross-clamp was applied. One liter of cold blood cardioplegia was delivered in split doses between the antegrade aortic and retrograde coronary sinus catheters. There was good cardioplegic arrest and septal temperature dropped less than 14 degrees.  Cardioplegia was delivered every 20 minutes.  The distal RCA was dissected and was diffusely diseased.  The anastomosis was placed just before the bifurcation of the posterior descending.  This was a 1.5-mm vessel and a 1-mm probe passed distally and proximally.  A reverse saphenous vein was sewn end-to-side with running 7-0 Prolene, and there was good flow through the graft. Cardioplegia was re-dosed.  The second distal anastomosis was to the second diagonal branch of the LAD.  This had a proximal 95% stenosis.  It was a 1.2-mm vessel.  A reverse saphenous vein was sewn end-to-side with running 8-0 Prolene. There was adequate flow through the graft.  A 1-mm probe passed distally.  Cardioplegia was re-dosed.  The third distal anastomosis was the distal third of the LAD before it became small and atretic.  It was a 1.5-mm vessel.  A 1-mm probe passed proximally and distally.  The left IMA pedicle was brought through an opening in the left lateral pericardium and was brought down onto the LAD and sewn end-to-side with running 8-0 Prolene.  There was good flow through the anastomosis after briefly releasing the pedicle bulldog on the mammary artery.  The bulldog was re-applied and the pedicle was secured to the epicardium with 6-0 Prolenes.  Cardioplegia was re-dosed.  With the cross-clamp still in place, 2 proximal vein anastomoses were performed  on the ascending aorta using a 4.5-mm punch running 6-0 Prolene.  Prior to tying down the final proximal anastomosis, air was vented from the coronaries with a dose of retrograde warm blood cardioplegia.  The cross-clamp was removed.  The vein grafts were de-aired and opened.  Each had good flow. Hemostasis was documented at the proximal and distal anastomoses.  The cardioplegia cannulas were removed.  The patient was rewarmed and reperfused.  Temporary pacing wires were applied.  The lungs were expanded.  The ventilator was resumed.  The patient was then weaned off cardiopulmonary bypass on Milrinone.  LV function appeared improved. Cardiac output and hemodynamics were stable.  The patient received protamine without adverse reaction.  The patient remained stable.  The cannulas were removed.  The mediastinum was irrigated.  Anterior mediastinal and left pleural chest tubes were placed and brought out through separate incisions.  The pericardium was closed.  The sternum was closed with interrupted steel wire.  The pectoralis fascia was closed with running #1 Vicryl.  The subcutaneous and skin layers were closed with running Vicryl and sterile dressings were applied.  Total cardiopulmonary bypass time was 100 minutes.     Ivin Poot, M.D.     PV/MEDQ  D:  05/27/2016  T:  05/28/2016  Job:  [id number]  cc:   Shaune Pascal. Bensimhon, MD

## 2016-05-28 NOTE — Progress Notes (Signed)
  Advanced Heart Failure Rounding Note   Subjective:    Transferred from [facility] with volume overload despite IV diuresis + milrinone. Continues to diurese with IV lasix. CVP down to 11 Co-ox 65% on milrinone 0.25mcg/kg/min  Now s/p CABG x 3 on 05/27/16  Doing well. Extubated. Has already dangled. Chest sore. Otherwise feels ok.   Objective:   Weight Range:  Vital Signs:   Temp:  [96.4 F (35.8 C)-97.9 F (36.6 C)] 97.3 F (36.3 C) (12/19 0900) Pulse Rate:  [58-91] 79 (12/19 0914) Resp:  [10-28] 17 (12/19 0900) BP: (85-136)/(52-80) 136/58 (12/19 0914) SpO2:  [94 %-99 %] 96 % (12/19 0900) Arterial Line BP: (100-172)/(55-80) 136/66 (12/19 0615) FiO2 (%):  [40 %-50 %] 40 % (12/19 0501) Weight:  [67.9 kg (149 lb 11.1 oz)] 67.9 kg (149 lb 11.1 oz) (12/19 0630) Last BM Date: 05/19/16  Weight change: Filed Weights   05/27/16 0400 05/28/16 0500 05/28/16 0630  Weight: 65.1 kg (143 lb 8.3 oz) 67.9 kg (149 lb 11.1 oz) 67.9 kg (149 lb 11.1 oz)    Intake/Output:   Intake/Output Summary (Last 24 hours) at 05/28/16 1037 Last data filed at 05/28/16 0900  Gross per 24 hour  Intake          4541.46 ml  Output             2875 ml  Net          1666.46 ml     Physical Exam: CVP 8-9 General:  Lying in bed AND HEENT: normal Neck: supple. JVP~8-9 . Carotids 2+ bilat; no bruits. No lymphadenopathy or thryomegaly appreciated. Cor: sternal dressing RRR   + CTs Lungs: clear anteriorls Abdomen: soft, nontender, + distended. hypoactivebowel sounds. Extremities: no cyanosis, clubbing, rash, R and LLE trace edema Neuro: alert & orientedx3, cranial nerves grossly intact. moves all 4 extremities w/o difficulty. Affect pleasant  Telemetry: NSR 70s   Labs: Basic Metabolic Panel:  Recent Labs Lab 05/25/16 0412 05/26/16 0430 05/26/16 1704 05/27/16 0439  05/27/16 1020 05/27/16 1219 05/27/16 1349 05/27/16 2000 05/27/16 2027 05/28/16 0420  NA 135 135 136 134*  < > 137 138 139  --   136 139  K 4.1 5.4* 3.9 4.2  < > 3.9 4.1 3.5  --  4.6 4.3  CL 95* 96* 102 95*  < > 96* 100* 99*  --  103 104  CO2 30 30 26 32  --   --   --   --   --   --  27  GLUCOSE 250* 205* 176* 208*  < > 156* 147* 107*  --  156* 134*  BUN 47* 52* 45* 51*  < > 42* 42* 40*  --  40* 43*  CREATININE 1.78* 1.92* 1.46* 1.70*  < > 1.30* 1.20* 1.40* 1.61* 1.40* 1.71*  CALCIUM 8.2* 8.5* 7.3* 8.4*  --   --   --   --   --   --  8.4*  MG  --   --   --   --   --   --   --   --  3.0*  --  2.8*  < > = values in this interval not displayed.  Liver Function Tests:  Recent Labs Lab 05/25/16 0412 05/26/16 0430  AST 23 34  ALT 24 25  ALKPHOS 204* 194*  BILITOT 0.6 1.1  PROT 5.6* 5.8*  ALBUMIN 2.4* 2.6*   No results for input(s): LIPASE, AMYLASE in the last 168 hours.   No results for input(s): AMMONIA in the last 168 hours.  CBC:  Recent Labs Lab 05/26/16 0430 05/27/16 0439  05/27/16 1127  05/27/16 1312 05/27/16 1349 05/27/16 2000 05/27/16 2027 05/28/16 0420  WBC 7.5 6.9  --   --   --  12.0*  --  13.8*  --  13.2*  HGB 9.0* 9.3*  < > 8.4*  < > 9.3* 8.8* 9.6* 9.9* 9.2*  HCT 29.1* 28.9*  < > 25.1*  < > 27.8* 26.0* 29.7* 29.0* 28.4*  MCV 83.6 83.8  --   --   --  81.5  --  84.1  --  82.6  PLT 355 358  --  241  --  250  --  257  --  296  < > = values in this interval not displayed.  Cardiac Enzymes: No results for input(s): CKTOTAL, CKMB, CKMBINDEX, TROPONINI in the last 168 hours.  BNP: BNP (last 3 results)  Recent Labs  06/19/15 0923 05/13/16 0905  BNP 1,782.0* 2,790.0*    ProBNP (last 3 results) No results for input(s): PROBNP in the last 8760 hours.    Other results:  Imaging: Dg Chest Port 1 View  Result Date: 05/28/2016 CLINICAL DATA:  65-year-old female post CABG. Hypertension. Diabetes. Cardiomyopathy. Subsequent encounter. EXAM: PORTABLE CHEST 1 VIEW COMPARISON:  05/27/2016 chest x-ray. FINDINGS: Endotracheal tube and nasogastric tube and been removed. Left chest tube in place  without evidence a pneumothorax. Swan-Ganz catheter enters from the right with tip in the region of the mid to distal aspect of the right main pulmonary artery. Two other right central lines are in place with tips in the region of the distal superior vena cava and right atrium. Post CABG.  Mediastinal drain in place. Cardiomegaly. Pulmonary vascular congestion most notable centrally. Retrocardiac opacity may represent atelectasis. Small left-sided pleural effusion cannot be excluded. IMPRESSION: Endotracheal tube and nasogastric tube and been removed. Cardiomegaly. Pulmonary vascular prominence most notable centrally. Opacification retrocardiac region may represent atelectasis although a small left-sided pleural effusion not excluded. Electronically Signed   By: Steven  Olson M.D.   On: 05/28/2016 08:15   Dg Chest Port 1 View  Result Date: 05/27/2016 CLINICAL DATA:  Hypertension, atrial fibrillation. EXAM: PORTABLE CHEST 1 VIEW COMPARISON:  Radiograph of same day. FINDINGS: Endotracheal and nasogastric tubes are unchanged in position. Right internal jugular Swan-Ganz catheter is noted with tip in right pulmonary artery. Left-sided chest tube is noted without pneumothorax. Status post coronary artery bypass graft. Stable position of right-sided PICC line with tip at cavoatrial junction. Stable position of right subclavian catheter with tip in right atrium. Mild bibasilar subsegmental atelectasis is noted. No significant pleural effusion is noted. Bony thorax is unremarkable. IMPRESSION: Stable support apparatus. Left-sided chest tube is noted without pneumothorax. Stable mild bibasilar subsegmental atelectasis. Right subclavian catheter tip is again noted in right atrium; withdrawal by 2-3 cm is recommended. Electronically Signed   By: James  Green Jr, M.D.   On: 05/27/2016 15:30   Dg Chest Portable 1 View  Result Date: 05/27/2016 CLINICAL DATA:  Status post CABG EXAM: PORTABLE CHEST 1 VIEW COMPARISON:   Chest radiograph 05/25/2016 all all FINDINGS: Right internal jugular pulmonary arterial catheter tip overlies the right pulmonary artery. Tip of a right subclavian line overlies the right atrium. Right IJ approach central venous catheters of also overlies the right atrium. Two electrodes overlie the right lung apex. There is a left-sided chest tube. Endotracheal tube tip is just below the level of the clavicular   heads. Nasogastric tube tip and side port overlie the gastric body. The cardiomediastinal contours are unchanged. There are not CABG markers and median sternotomy wires. There is no focal airspace consolidation or pulmonary edema. There is left basilar atelectasis. IMPRESSION: 1. PA catheter tip overlying the right pulmonary artery. 2. Radiographically appropriate positioning of endotracheal tube. 3. No focal consolidation or pulmonary edema. Left basilar atelectasis. Electronically Signed   By: Kevin  Herman M.D.   On: 05/27/2016 13:35     Medications:     Scheduled Medications: . acetaminophen  1,000 mg Oral Q6H   Or  . acetaminophen (TYLENOL) oral liquid 160 mg/5 mL  1,000 mg Per Tube Q6H  . aspirin EC  325 mg Oral Daily   Or  . aspirin  324 mg Per Tube Daily  . atorvastatin  40 mg Oral q1800  . bisacodyl  10 mg Oral Daily   Or  . bisacodyl  10 mg Rectal Daily  . cefUROXime (ZINACEF)  IV  1.5 g Intravenous Q12H  . chlorhexidine gluconate (MEDLINE KIT)  15 mL Mouth Rinse BID  . docusate sodium  200 mg Oral Daily  . insulin aspart  0-24 Units Subcutaneous Q4H  . insulin detemir  10 Units Subcutaneous BID  . mouth rinse  15 mL Mouth Rinse QID  . metoCLOPramide (REGLAN) injection  10 mg Intravenous Q6H  . metoprolol tartrate  12.5 mg Oral BID   Or  . metoprolol tartrate  12.5 mg Per Tube BID  . [START ON 05/29/2016] pantoprazole  40 mg Oral Daily  . sodium chloride flush  3 mL Intravenous Q12H  . vancomycin  500 mg Intravenous Q12H    Infusions: . sodium chloride    . sodium  chloride    . sodium chloride 20 mL/hr at 05/28/16 0900  . DOPamine    . insulin (NOVOLIN-R) infusion 0.6 Units/hr (05/28/16 0900)  . lactated ringers    . lactated ringers 20 mL/hr at 05/28/16 0900  . milrinone 0.3 mcg/kg/min (05/28/16 0916)  . nitroGLYCERIN 5 mcg/min (05/28/16 0900)  . phenylephrine (NEO-SYNEPHRINE) Adult infusion Stopped (05/28/16 0513)    PRN Medications: sodium chloride, albumin human, metoprolol, midazolam, morphine injection, ondansetron (ZOFRAN) IV, oxyCODONE, sodium chloride flush, traMADol   Assessment:   1. A/C Systolic Heart Failure  2. NSTEMI with CAD-severe 2 vessel disease LAD RCA    --s/pg CABG 12/18 with LIMA-LAD, SVG-RCA and SVG-diagonal 3. CKD 4.Uncontrolled DM- Hgb AC 11.3  5. Hyperkalemia  6. Elevated TSH 7. COPD   Plan/Discussion:    Doing well s/p CABG. Remains on milrinone. Will get co-ox and try to wean. Weight up only about 5 pounds. Diurese gently.   Renal function stable. Will consider starting ARB/ARNI in next day or two. Continue low-dose b-blocker as tolerated. Continue ASA and statin Mobilize.   Maintaining NSR.    Length of Stay: 8   Bensimhon, Daniel MD 05/28/2016, 10:37 AM  Advanced Heart Failure Team Pager [phone] (M-F; 7a - 4p)  Please contact [facility] for night-coverage after hours (4p -7a ) and weekends on [email or web address]   

## 2016-05-28 NOTE — Anesthesia Postprocedure Evaluation (Signed)
Anesthesia Post Note  Patient: Theresa Rogers  Procedure(s) Performed: Procedure(s) (LRB): CORONARY ARTERY BYPASS GRAFTING (CABG) x3 using left internal mammary artery and left greater saphenous vein harvested endoscopically (N/A) TRANSESOPHAGEAL ECHOCARDIOGRAM (TEE) (N/A)  Patient location during evaluation: ICU Anesthesia Type: General Level of consciousness: patient remains intubated per anesthesia plan and sedated Pain management: pain level controlled Vital Signs Assessment: post-procedure vital signs reviewed and stable Respiratory status: patient on ventilator - see flowsheet for VS and patient remains intubated per anesthesia plan Cardiovascular status: stable Anesthetic complications: no       Last Vitals:  Vitals:   05/28/16 0900 05/28/16 0914  BP: (!) 112/59 (!) 136/58  Pulse: 81 79  Resp: 17   Temp: 36.3 C     Last Pain:  Vitals:   05/28/16 0400  TempSrc: Core (Comment)  PainSc:                  [place]

## 2016-05-28 NOTE — Progress Notes (Signed)
Pt breathing the rate on the vent. Rate increase to 12 per protocol initial wean parameters. No distress noted. Pt is very restless, holding off on wean at this time

## 2016-05-29 ENCOUNTER — Inpatient Hospital Stay (HOSPITAL_COMMUNITY): Payer: Medicaid Other

## 2016-05-29 DIAGNOSIS — N179 Acute kidney failure, unspecified: Secondary | ICD-10-CM

## 2016-05-29 LAB — GLUCOSE, CAPILLARY
GLUCOSE-CAPILLARY: 157 mg/dL — AB (ref 65–99)
GLUCOSE-CAPILLARY: 252 mg/dL — AB (ref 65–99)
GLUCOSE-CAPILLARY: 94 mg/dL (ref 65–99)
Glucose-Capillary: 155 mg/dL — ABNORMAL HIGH (ref 65–99)
Glucose-Capillary: 119 mg/dL — ABNORMAL HIGH (ref 65–99)
Glucose-Capillary: 157 mg/dL — ABNORMAL HIGH (ref 65–99)

## 2016-05-29 LAB — POCT I-STAT, CHEM 8
BUN: 67 mg/dL — ABNORMAL HIGH (ref 6–20)
CALCIUM ION: 1.22 mmol/L (ref 1.15–1.40)
Chloride: 98 mmol/L — ABNORMAL LOW (ref 101–111)
Creatinine, Ser: 3 mg/dL — ABNORMAL HIGH (ref 0.44–1.00)
GLUCOSE: 201 mg/dL — AB (ref 65–99)
HCT: 25 % — ABNORMAL LOW (ref 36.0–46.0)
Hemoglobin: 8.5 g/dL — ABNORMAL LOW (ref 12.0–15.0)
Potassium: 4.5 mmol/L (ref 3.5–5.1)
SODIUM: 134 mmol/L — AB (ref 135–145)
TCO2: 28 mmol/L (ref 0–100)

## 2016-05-29 LAB — BASIC METABOLIC PANEL
Anion gap: 9 (ref 5–15)
BUN: 54 mg/dL — ABNORMAL HIGH (ref 6–20)
CO2: 27 mmol/L (ref 22–32)
Calcium: 8.7 mg/dL — ABNORMAL LOW (ref 8.9–10.3)
Chloride: 102 mmol/L (ref 101–111)
Creatinine, Ser: 2.84 mg/dL — ABNORMAL HIGH (ref 0.44–1.00)
GFR calc Af Amer: 19 mL/min — ABNORMAL LOW (ref >60)
GFR calc non Af Amer: 16 mL/min — ABNORMAL LOW (ref >60)
Glucose, Bld: 156 mg/dL — ABNORMAL HIGH (ref 65–99)
Potassium: 4.4 mmol/L (ref 3.5–5.1)
Sodium: 138 mmol/L (ref 135–145)

## 2016-05-29 LAB — COOXEMETRY PANEL
Carboxyhemoglobin: 1.1 % (ref 0.5–1.5)
Methemoglobin: 1.3 % (ref 0.0–1.5)
O2 Saturation: 70.3 %
Total hemoglobin: 8.8 g/dL — ABNORMAL LOW (ref 12.0–16.0)

## 2016-05-29 LAB — CBC
HCT: 28.2 % — ABNORMAL LOW (ref 36.0–46.0)
Hemoglobin: 9.1 g/dL — ABNORMAL LOW (ref 12.0–15.0)
MCH: 26.9 pg (ref 26.0–34.0)
MCHC: 32.3 g/dL (ref 30.0–36.0)
MCV: 83.4 fL (ref 78.0–100.0)
Platelets: 337 10*3/uL (ref 150–400)
RBC: 3.38 MIL/uL — ABNORMAL LOW (ref 3.87–5.11)
RDW: 15.8 % — ABNORMAL HIGH (ref 11.5–15.5)
WBC: 18.1 10*3/uL — ABNORMAL HIGH (ref 4.0–10.5)

## 2016-05-29 MED ORDER — ASPIRIN EC 81 MG PO TBEC
81.0000 mg | DELAYED_RELEASE_TABLET | Freq: Every day | ORAL | Status: DC
  Administered 2016-05-30 – 2016-06-08 (×10): 81 mg via ORAL
  Filled 2016-05-29 (×10): qty 1

## 2016-05-29 MED ORDER — CLOPIDOGREL BISULFATE 75 MG PO TABS
75.0000 mg | ORAL_TABLET | Freq: Every day | ORAL | Status: DC
  Administered 2016-05-30 – 2016-06-04 (×6): 75 mg via ORAL
  Filled 2016-05-29 (×6): qty 1

## 2016-05-29 MED ORDER — SODIUM CHLORIDE 0.9% FLUSH: 10.0000 mL | INTRAVENOUS | Status: DC | PRN

## 2016-05-29 MED ORDER — SODIUM CHLORIDE 0.9% FLUSH
10.0000 mL | Freq: Two times a day (BID) | INTRAVENOUS | Status: DC
  Administered 2016-05-29: 30 mL
  Administered 2016-05-29: 40 mL
  Administered 2016-05-30: 10 mL

## 2016-05-29 NOTE — Progress Notes (Signed)
Patients urine output has decreased to 10-15cc/hour for two hours. Dr. Servando Snare notified. No new orders given. Will continue to monitor.

## 2016-05-29 NOTE — Progress Notes (Signed)
Advanced Heart Failure Rounding Note   Subjective:    Transferred from [facility] with volume overload despite IV diuresis + milrinone. Continues to diurese with IV lasix. CVP down to 11 Co-ox 65% on milrinone 0.17mcg/kg/min  Now s/p CABG x 3 on 05/27/16  Doing well. Mildly sore. Chest and MT still draining some. Creatinine up 2.1->2.8. Denies dyspnea. BP stable  Objective:   Weight Range:  Vital Signs:   Temp:  [97.8 F (36.6 C)-98.6 F (37 C)] 98.3 F (36.8 C) (12/20 0816) Pulse Rate:  [68-98] 88 (12/20 0800) Resp:  [9-24] 17 (12/20 0800) BP: (82-129)/(42-87) 129/59 (12/20 0800) SpO2:  [91 %-100 %] 91 % (12/20 0800) Weight:  [69.3 kg (152 lb 12.5 oz)] 69.3 kg (152 lb 12.5 oz) (12/20 0500) Last BM Date: 05/19/16  Weight change: Filed Weights   05/28/16 0500 05/28/16 0630 05/29/16 0500  Weight: 67.9 kg (149 lb 11.1 oz) 67.9 kg (149 lb 11.1 oz) 69.3 kg (152 lb 12.5 oz)    Intake/Output:   Intake/Output Summary (Last 24 hours) at 05/29/16 0930 Last data filed at 05/29/16 0800  Gross per 24 hour  Intake          1505.39 ml  Output              715 ml  Net           790.39 ml     Physical Exam: General:  Lying in bed NAD HEENT: normal Neck: supple. JVP~6-7 . Carotids 2+ bilat; no bruits. No lymphadenopathy or thryomegaly appreciated. Cor: sternal dressing RRR   + CTs Lungs: clear anteriorls Abdomen: soft, nontender, + distended. hypoactivebowel sounds. Extremities: no cyanosis, clubbing, rash, R and LLE trace edema Neuro: alert & orientedx3, cranial nerves grossly intact. moves all 4 extremities w/o difficulty. Affect pleasant  Telemetry: NSR 70s   Labs: Basic Metabolic Panel:  Recent Labs Lab 05/26/16 0430 05/26/16 1704 05/27/16 0439  05/27/16 1349 05/27/16 2000 05/27/16 2027 05/28/16 0420 05/28/16 1800 05/28/16 1804 05/29/16 0400  NA 135 136 134*  < > 139  --  136 139  --  138 138  K 5.4* 3.9 4.2  < > 3.5  --  4.6 4.3  --  4.3 4.4  CL 96* 102 95*   < > 99*  --  103 104  --  102 102  CO2 30 26 32  --   --   --   --  27  --   --  27  GLUCOSE 205* 176* 208*  < > 107*  --  156* 134*  --  171* 156*  BUN 52* 45* 51*  < > 40*  --  40* 43*  --  44* 54*  CREATININE 1.92* 1.46* 1.70*  < > 1.40* 1.61* 1.40* 1.71* 2.36* 2.10* 2.84*  CALCIUM 8.5* 7.3* 8.4*  --   --   --   --  8.4*  --   --  8.7*  MG  --   --   --   --   --  3.0*  --  2.8* 2.8*  --   --   < > = values in this interval not displayed.  Liver Function Tests:  Recent Labs Lab 05/25/16 0412 05/26/16 0430  AST 23 34  ALT 24 25  ALKPHOS 204* 194*  BILITOT 0.6 1.1  PROT 5.6* 5.8*  ALBUMIN 2.4* 2.6*   No results for input(s): LIPASE, AMYLASE in the last 168 hours. No results for input(s):  AMMONIA in the last 168 hours.  CBC:  Recent Labs Lab 05/27/16 1312  05/27/16 2000 05/27/16 2027 05/28/16 0420 05/28/16 1800 05/28/16 1804 05/29/16 0400  WBC 12.0*  --  13.8*  --  13.2* 16.8*  --  18.1*  HGB 9.3*  < > 9.6* 9.9* 9.2* 9.1* 8.8* 9.1*  HCT 27.8*  < > 29.7* 29.0* 28.4* 28.3* 26.0* 28.2*  MCV 81.5  --  84.1  --  82.6 83.0  --  83.4  PLT 250  --  257  --  296 302  --  337  < > = values in this interval not displayed.  Cardiac Enzymes: No results for input(s): CKTOTAL, CKMB, CKMBINDEX, TROPONINI in the last 168 hours.  BNP: BNP (last 3 results)  Recent Labs  06/19/15 0923 05/13/16 0905  BNP 1,782.0* 2,790.0*    ProBNP (last 3 results) No results for input(s): PROBNP in the last 8760 hours.    Other results:  Imaging: Dg Chest Port 1 View  Result Date: 05/29/2016 CLINICAL DATA:  Pneumothorax EXAM: PORTABLE CHEST 1 VIEW COMPARISON:  05/28/2016 FINDINGS: Swan-Ganz catheter are removed with the right jugular introducer left in place. Right subclavian central venous catheter are, right PICC, and left chest tube stable. No pneumothorax. Cardiomegaly persists. Hazy central and basilar airspace disease persists. Small right pleural effusion. IMPRESSION: No  significant change. Bilateral hazy airspace disease persists. No sign of pneumothorax with left chest tube in place. Electronically Signed   By: Marybelle Killings M.D.   On: 05/29/2016 08:41   Dg Chest Port 1 View  Result Date: 05/28/2016 CLINICAL DATA:  66 year old female post CABG. Hypertension. Diabetes. Cardiomyopathy. Subsequent encounter. EXAM: PORTABLE CHEST 1 VIEW COMPARISON:  05/27/2016 chest x-ray. FINDINGS: Endotracheal tube and nasogastric tube and been removed. Left chest tube in place without evidence a pneumothorax. Swan-Ganz catheter enters from the right with tip in the region of the mid to distal aspect of the right main pulmonary artery. Two other right central lines are in place with tips in the region of the distal superior vena cava and right atrium. Post CABG.  Mediastinal drain in place. Cardiomegaly. Pulmonary vascular congestion most notable centrally. Retrocardiac opacity may represent atelectasis. Small left-sided pleural effusion cannot be excluded. IMPRESSION: Endotracheal tube and nasogastric tube and been removed. Cardiomegaly. Pulmonary vascular prominence most notable centrally. Opacification retrocardiac region may represent atelectasis although a small left-sided pleural effusion not excluded. Electronically Signed   By: Genia Del M.D.   On: 05/28/2016 08:15   Dg Chest Port 1 View  Result Date: 05/27/2016 CLINICAL DATA:  Hypertension, atrial fibrillation. EXAM: PORTABLE CHEST 1 VIEW COMPARISON:  Radiograph of same day. FINDINGS: Endotracheal and nasogastric tubes are unchanged in position. Right internal jugular Swan-Ganz catheter is noted with tip in right pulmonary artery. Left-sided chest tube is noted without pneumothorax. Status post coronary artery bypass graft. Stable position of right-sided PICC line with tip at cavoatrial junction. Stable position of right subclavian catheter with tip in right atrium. Mild bibasilar subsegmental atelectasis is noted. No  significant pleural effusion is noted. Bony thorax is unremarkable. IMPRESSION: Stable support apparatus. Left-sided chest tube is noted without pneumothorax. Stable mild bibasilar subsegmental atelectasis. Right subclavian catheter tip is again noted in right atrium; withdrawal by 2-3 cm is recommended. Electronically Signed   By: Marijo Conception, M.D.   On: 05/27/2016 15:30   Dg Chest Portable 1 View  Result Date: 05/27/2016 CLINICAL DATA:  Status post CABG EXAM: PORTABLE CHEST 1 VIEW  COMPARISON:  Chest radiograph 05/25/2016 all all FINDINGS: Right internal jugular pulmonary arterial catheter tip overlies the right pulmonary artery. Tip of a right subclavian line overlies the right atrium. Right IJ approach central venous catheters of also overlies the right atrium. Two electrodes overlie the right lung apex. There is a left-sided chest tube. Endotracheal tube tip is just below the level of the clavicular heads. Nasogastric tube tip and side port overlie the gastric body. The cardiomediastinal contours are unchanged. There are not CABG markers and median sternotomy wires. There is no focal airspace consolidation or pulmonary edema. There is left basilar atelectasis. IMPRESSION: 1. PA catheter tip overlying the right pulmonary artery. 2. Radiographically appropriate positioning of endotracheal tube. 3. No focal consolidation or pulmonary edema. Left basilar atelectasis. Electronically Signed   By: Ulyses Jarred M.D.   On: 05/27/2016 13:35     Medications:     Scheduled Medications: . acetaminophen  1,000 mg Oral Q6H   Or  . acetaminophen (TYLENOL) oral liquid 160 mg/5 mL  1,000 mg Per Tube Q6H  . [START ON 05/30/2016] aspirin EC  81 mg Oral Daily  . atorvastatin  40 mg Oral q1800  . bisacodyl  10 mg Oral Daily   Or  . bisacodyl  10 mg Rectal Daily  . [START ON 05/30/2016] clopidogrel  75 mg Oral Daily  . docusate sodium  200 mg Oral Daily  . guaiFENesin  600 mg Oral BID  . insulin aspart   0-24 Units Subcutaneous Q4H  . insulin detemir  10 Units Subcutaneous BID  . metoCLOPramide (REGLAN) injection  10 mg Intravenous Q6H  . metoprolol tartrate  12.5 mg Oral BID   Or  . metoprolol tartrate  12.5 mg Per Tube BID  . pantoprazole  40 mg Oral Daily  . sodium chloride flush  10-40 mL Intracatheter Q12H  . sodium chloride flush  3 mL Intravenous Q12H    Infusions: . sodium chloride    . sodium chloride    . sodium chloride 20 mL/hr at 05/29/16 0800  . DOPamine 3 mcg/kg/min (05/29/16 0904)  . lactated ringers    . lactated ringers 20 mL/hr at 05/29/16 0800  . milrinone 0.3 mcg/kg/min (05/29/16 0800)  . nitroGLYCERIN Stopped (05/28/16 0700)  . phenylephrine (NEO-SYNEPHRINE) Adult infusion Stopped (05/28/16 0513)    PRN Medications: sodium chloride, metoprolol, midazolam, morphine injection, ondansetron (ZOFRAN) IV, oxyCODONE, promethazine, sodium chloride flush, sodium chloride flush, traMADol   Assessment:   1. A/C Systolic Heart Failure  2. NSTEMI with CAD-severe 2 vessel disease LAD RCA    --s/pg CABG 12/18 with LIMA-LAD, SVG-RCA and SVG-diagonal 3. AKI on CKD 4.Uncontrolled DM- Hgb AC 11.3  5. Hyperkalemia  6. Elevated TSH 7. COPD   Plan/Discussion:    Doing well s/p CABG. Co-ox looks good. Will wean milrinone. Can wean dopamine as tolerated. Creatinine up. Suspect she may be dry. Hold lasix.Marland Kitchen Hook up CVP line.   Continue low-dose b-blocker as tolerated. No ACE/ARB with AKI. Continue ASA and statin  Management of CTs per TCTS  Maintaining NSR.    Length of Stay: 9   Cylis Ayars MD 05/29/2016, 9:30 AM  Advanced Heart Failure Team Pager [phone] (M-F; [place])  Please contact [facility] for night-coverage after hours (4p -7a ) and weekends on [email or web address]

## 2016-05-29 NOTE — Progress Notes (Signed)
2 Days Post-Op Procedure(s) (LRB): CORONARY ARTERY BYPASS GRAFTING (CABG) x3 using left internal mammary artery and left greater saphenous vein harvested endoscopically (N/A) TRANSESOPHAGEAL ECHOCARDIOGRAM (TEE) (N/A) Subjective: Less nausea Wants to walk Acute on chronic postop renal failure creat 2.8- cont renal dobamine DM controlled lantus + SSI  Objective: Vital signs in last 24 hours: Temp:  [97.8 F (36.6 C)-98.6 F (37 C)] 98.3 F (36.8 C) (12/20 0816) Pulse Rate:  [78-98] 88 (12/20 0800) Cardiac Rhythm: Normal sinus rhythm (12/20 0800) Resp:  [9-22] 17 (12/20 0800) BP: (82-129)/(42-87) 129/59 (12/20 0800) SpO2:  [91 %-100 %] 91 % (12/20 0800) Weight:  [152 lb 12.5 oz (69.3 kg)] 152 lb 12.5 oz (69.3 kg) (12/20 0500)  Hemodynamic parameters for last 24 hours:  nsr  Intake/Output from previous day: 12/19 0701 - 12/20 0700 In: 1308.3 [P.O.:240; I.V.:908.3; IV Piggyback:150] Out: 800 [Urine:510; Chest Tube:290] Intake/Output this shift: Total I/O In: 338 [I.V.:338] Out: 25 [Urine:25]       Exam    General- alert and comfortable   Lungs- clear without rales, wheezes   Cor- regular rate and rhythm, no murmur , gallop   Abdomen- soft, non-tender   Extremities - warm, non-tender, minimal edema   Neuro- oriented, appropriate, no focal weakness   Lab Results:  Recent Labs  05/28/16 1800 05/28/16 1804 05/29/16 0400  WBC 16.8*  --  18.1*  HGB 9.1* 8.8* 9.1*  HCT 28.3* 26.0* 28.2*  PLT 302  --  337   BMET:  Recent Labs  05/28/16 0420  05/28/16 1804 05/29/16 0400  NA 139  --  138 138  K 4.3  --  4.3 4.4  CL 104  --  102 102  CO2 27  --   --  27  GLUCOSE 134*  --  171* 156*  BUN 43*  --  44* 54*  CREATININE 1.71*  < > 2.10* 2.84*  CALCIUM 8.4*  --   --  8.7*  < > = values in this interval not displayed.  PT/INR:  Recent Labs  05/27/16 1312  LABPROT 16.4*  INR 1.31   ABG    Component Value Date/Time   PHART 7.425 05/28/2016 0512   HCO3 25.9  05/28/2016 0512   TCO2 26 05/28/2016 1804   ACIDBASEDEF 1.0 05/27/2016 1417   O2SAT 70.3 05/29/2016 0410   CBG (last 3)   Recent Labs  05/28/16 2349 05/29/16 0352 05/29/16 0813  GLUCAP 222* 155* 119*    Assessment/Plan: S/P Procedure(s) (LRB): CORONARY ARTERY BYPASS GRAFTING (CABG) x3 using left internal mammary artery and left greater saphenous vein harvested endoscopically (N/A) TRANSESOPHAGEAL ECHOCARDIOGRAM (TEE) (N/A) Mobilize Diabetes control Continue foley due to urinary output monitoring cont renal dopamine   LOS: 9 days    [name] 05/29/2016

## 2016-05-29 NOTE — Progress Notes (Signed)
CT surgery p.m. Rounds  Patient had good day walked twice in hallway Maintaining sinus rhythm Blood glucose adequately controlled with Levemir plus sliding scale P.m. creatinine 3.0, starting to plateau Urine output adequate-holding Lasix and continuing renal dose dopamine

## 2016-05-30 ENCOUNTER — Inpatient Hospital Stay (HOSPITAL_COMMUNITY): Payer: Medicaid Other

## 2016-05-30 DIAGNOSIS — I480 Paroxysmal atrial fibrillation: Secondary | ICD-10-CM

## 2016-05-30 DIAGNOSIS — Z951 Presence of aortocoronary bypass graft: Secondary | ICD-10-CM

## 2016-05-30 LAB — BASIC METABOLIC PANEL
Anion gap: 7 (ref 5–15)
BUN: 65 mg/dL — ABNORMAL HIGH (ref 6–20)
CO2: 27 mmol/L (ref 22–32)
Calcium: 8.6 mg/dL — ABNORMAL LOW (ref 8.9–10.3)
Chloride: 102 mmol/L (ref 101–111)
Creatinine, Ser: 2.76 mg/dL — ABNORMAL HIGH (ref 0.44–1.00)
GFR calc Af Amer: 20 mL/min — ABNORMAL LOW (ref >60)
GFR calc non Af Amer: 17 mL/min — ABNORMAL LOW (ref >60)
Glucose, Bld: 83 mg/dL (ref 65–99)
Potassium: 4.3 mmol/L (ref 3.5–5.1)
Sodium: 136 mmol/L (ref 135–145)

## 2016-05-30 LAB — TYPE AND SCREEN
Blood Product Expiration Date: 201712302359
Blood Product Expiration Date: 201712302359
Blood Product Expiration Date: 201712302359
Blood Product Expiration Date: 201801012359
ISSUE DATE / TIME: 201712180806
ISSUE DATE / TIME: 201712180806
ISSUE DATE / TIME: 201712181009
ISSUE DATE / TIME: 201712181009
Unit Type and Rh: 5100
Unit Type and Rh: 5100
Unit Type and Rh: 5100
Unit Type and Rh: 5100

## 2016-05-30 LAB — CBC
HCT: 28 % — ABNORMAL LOW (ref 36.0–46.0)
Hemoglobin: 9 g/dL — ABNORMAL LOW (ref 12.0–15.0)
MCH: 26.9 pg (ref 26.0–34.0)
MCHC: 32.1 g/dL (ref 30.0–36.0)
MCV: 83.6 fL (ref 78.0–100.0)
Platelets: 346 10*3/uL (ref 150–400)
RBC: 3.35 MIL/uL — ABNORMAL LOW (ref 3.87–5.11)
RDW: 16.1 % — ABNORMAL HIGH (ref 11.5–15.5)
WBC: 16.6 10*3/uL — ABNORMAL HIGH (ref 4.0–10.5)

## 2016-05-30 LAB — GLUCOSE, CAPILLARY
GLUCOSE-CAPILLARY: 146 mg/dL — AB (ref 65–99)
GLUCOSE-CAPILLARY: 232 mg/dL — AB (ref 65–99)
GLUCOSE-CAPILLARY: 162 mg/dL — AB (ref 65–99)
Glucose-Capillary: 44 mg/dL — CL (ref 65–99)
Glucose-Capillary: 71 mg/dL (ref 65–99)
Glucose-Capillary: 200 mg/dL — ABNORMAL HIGH (ref 65–99)
Glucose-Capillary: 174 mg/dL — ABNORMAL HIGH (ref 65–99)

## 2016-05-30 LAB — COOXEMETRY PANEL
Carboxyhemoglobin: 1.3 % (ref 0.5–1.5)
Methemoglobin: 1.2 % (ref 0.0–1.5)
O2 Saturation: 64.1 %
Total hemoglobin: 9.1 g/dL — ABNORMAL LOW (ref 12.0–16.0)

## 2016-05-30 MED ORDER — DEXTROSE 50 % IV SOLN
INTRAVENOUS | Status: AC
  Administered 2016-05-30: 50 mL
  Filled 2016-05-30: qty 50

## 2016-05-30 MED ORDER — INSULIN ASPART 100 UNIT/ML ~~LOC~~ SOLN
0.0000 [IU] | Freq: Three times a day (TID) | SUBCUTANEOUS | Status: DC
  Administered 2016-05-30: 5 [IU] via SUBCUTANEOUS
  Administered 2016-05-30: 3 [IU] via SUBCUTANEOUS
  Administered 2016-05-31: 8 [IU] via SUBCUTANEOUS
  Administered 2016-05-31 (×2): 15 [IU] via SUBCUTANEOUS
  Administered 2016-06-01: 3 [IU] via SUBCUTANEOUS
  Administered 2016-06-01 (×2): 5 [IU] via SUBCUTANEOUS
  Administered 2016-06-02: 3 [IU] via SUBCUTANEOUS
  Administered 2016-06-02: 5 [IU] via SUBCUTANEOUS
  Administered 2016-06-03: 11 [IU] via SUBCUTANEOUS
  Administered 2016-06-03: 8 [IU] via SUBCUTANEOUS
  Administered 2016-06-03: 11 [IU] via SUBCUTANEOUS
  Administered 2016-06-04: 2 [IU] via SUBCUTANEOUS
  Administered 2016-06-04 – 2016-06-05 (×3): 3 [IU] via SUBCUTANEOUS
  Administered 2016-06-05: 5 [IU] via SUBCUTANEOUS
  Administered 2016-06-06: 2 [IU] via SUBCUTANEOUS
  Administered 2016-06-06: 3 [IU] via SUBCUTANEOUS
  Administered 2016-06-07: 5 [IU] via SUBCUTANEOUS
  Administered 2016-06-07: 8 [IU] via SUBCUTANEOUS
  Administered 2016-06-08: 5 [IU] via SUBCUTANEOUS
  Administered 2016-06-08: 8 [IU] via SUBCUTANEOUS
  Administered 2016-06-09: 3 [IU] via SUBCUTANEOUS

## 2016-05-30 MED ORDER — AMIODARONE HCL IN DEXTROSE 360-4.14 MG/200ML-% IV SOLN
30.0000 mg/h | INTRAVENOUS | Status: DC
  Administered 2016-05-30: 30 mg/h via INTRAVENOUS
  Filled 2016-05-30 (×2): qty 200

## 2016-05-30 MED ORDER — INSULIN ASPART 100 UNIT/ML ~~LOC~~ SOLN
0.0000 [IU] | Freq: Every day | SUBCUTANEOUS | Status: DC
  Administered 2016-05-31: 3 [IU] via SUBCUTANEOUS

## 2016-05-30 MED ORDER — AMIODARONE IV BOLUS ONLY 150 MG/100ML
150.0000 mg | Freq: Once | INTRAVENOUS | Status: AC
  Administered 2016-05-30: 150 mg via INTRAVENOUS

## 2016-05-30 MED ORDER — AMIODARONE IV BOLUS ONLY 150 MG/100ML
150.0000 mg | Freq: Once | INTRAVENOUS | Status: AC
  Administered 2016-05-30: 150 mg via INTRAVENOUS
  Filled 2016-05-30: qty 100

## 2016-05-30 MED ORDER — DOPAMINE-DEXTROSE 3.2-5 MG/ML-% IV SOLN
2.5000 ug/kg/min | INTRAVENOUS | Status: DC
  Administered 2016-05-30: 2.5 ug/kg/min via INTRAVENOUS
  Filled 2016-05-30: qty 250

## 2016-05-30 MED ORDER — AMIODARONE HCL 200 MG PO TABS
200.0000 mg | ORAL_TABLET | Freq: Two times a day (BID) | ORAL | Status: DC
  Administered 2016-05-30: 200 mg via ORAL
  Filled 2016-05-30: qty 1

## 2016-05-30 MED FILL — Lidocaine HCl IV Inj 20 MG/ML: INTRAVENOUS | Qty: 5 | Status: AC

## 2016-05-30 MED FILL — Sodium Bicarbonate IV Soln 8.4%: INTRAVENOUS | Qty: 50 | Status: AC

## 2016-05-30 MED FILL — Heparin Sodium (Porcine) Inj 1000 Unit/ML: INTRAMUSCULAR | Qty: 10 | Status: AC

## 2016-05-30 MED FILL — Sodium Chloride IV Soln 0.9%: INTRAVENOUS | Qty: 2000 | Status: AC

## 2016-05-30 MED FILL — Electrolyte-R (PH 7.4) Solution: INTRAVENOUS | Qty: 4000 | Status: AC

## 2016-05-30 MED FILL — Mannitol IV Soln 20%: INTRAVENOUS | Qty: 500 | Status: AC

## 2016-05-30 MED FILL — Calcium Chloride Inj 10%: INTRAVENOUS | Qty: 10 | Status: AC

## 2016-05-30 NOTE — Progress Notes (Signed)
Advanced Heart Failure Rounding Note   Subjective:    Transferred from [facility] with volume overload despite IV diuresis + milrinone. Continues to diurese with IV lasix. CVP down to 11 Co-ox 65% on milrinone 0.51mcg/kg/min  Now s/p CABG x 3 on 05/27/16  Diuretics held yesterday due to AKI. Creatinine up to 3.00 now back to 2.76.  Milrinone decreased to 0.2 yesterday. Co-ox 64% CVP 12  Had breif AF last night treated with IV amio. Now back in NSR.   Objective:   Weight Range:  Vital Signs:   Temp:  [98.3 F (36.8 C)-98.7 F (37.1 C)] 98.3 F (36.8 C) (12/21 0403) Pulse Rate:  [55-93] 69 (12/21 1600) Resp:  [14-27] 22 (12/21 1600) BP: (84-156)/(57-95) 117/64 (12/21 1600) SpO2:  [85 %-98 %] 95 % (12/21 1600) Weight:  [153 lb 3.5 oz (69.5 kg)] 153 lb 3.5 oz (69.5 kg) (12/21 0500) Last BM Date: 05/19/16  Weight change: Filed Weights   05/28/16 0630 05/29/16 0500 05/30/16 0500  Weight: 149 lb 11.1 oz (67.9 kg) 152 lb 12.5 oz (69.3 kg) 153 lb 3.5 oz (69.5 kg)    Intake/Output:   Intake/Output Summary (Last 24 hours) at 05/30/16 1739 Last data filed at 05/30/16 1600  Gross per 24 hour  Intake           4429.6 ml  Output             1040 ml  Net           3389.6 ml     Physical Exam: General:  Lying in bed NAD HEENT: normal Neck: supple. JVP ~10 . Carotids 2+ bilat; no bruits. No lymphadenopathy or thryomegaly appreciated. Cor: sternal dressing RRR   + CTs Lungs: clear anteriorls Abdomen: soft, nontender, + distended. hypoactivebowel sounds. Extremities: no cyanosis, clubbing, rash, R and LLE trace edema Neuro: alert & orientedx3, cranial nerves grossly intact. moves all 4 extremities w/o difficulty. Affect pleasant  Telemetry: NSR 70s   Labs: Basic Metabolic Panel:  Recent Labs Lab 05/26/16 1704 05/27/16 0439  05/27/16 2000  05/28/16 0420 05/28/16 1800 05/28/16 1804 05/29/16 0400 05/29/16 1703 05/30/16 0500  NA 136 134*  < >  --   < > 139  --  138 138  134* 136  K 3.9 4.2  < >  --   < > 4.3  --  4.3 4.4 4.5 4.3  CL 102 95*  < >  --   < > 104  --  102 102 98* 102  CO2 26 32  --   --   --  27  --   --  27  --  27  GLUCOSE 176* 208*  < >  --   < > 134*  --  171* 156* 201* 83  BUN 45* 51*  < >  --   < > 43*  --  44* 54* 67* 65*  CREATININE 1.46* 1.70*  < > 1.61*  < > 1.71* 2.36* 2.10* 2.84* 3.00* 2.76*  CALCIUM 7.3* 8.4*  --   --   --  8.4*  --   --  8.7*  --  8.6*  MG  --   --   --  3.0*  --  2.8* 2.8*  --   --   --   --   < > = values in this interval not displayed.  Liver Function Tests:  Recent Labs Lab 05/25/16 0412 05/26/16 0430  AST 23 34  ALT 24  25  ALKPHOS 204* 194*  BILITOT 0.6 1.1  PROT 5.6* 5.8*  ALBUMIN 2.4* 2.6*   No results for input(s): LIPASE, AMYLASE in the last 168 hours. No results for input(s): AMMONIA in the last 168 hours.  CBC:  Recent Labs Lab 05/27/16 2000  05/28/16 0420 05/28/16 1800 05/28/16 1804 05/29/16 0400 05/29/16 1703 05/30/16 0500  WBC 13.8*  --  13.2* 16.8*  --  18.1*  --  16.6*  HGB 9.6*  < > 9.2* 9.1* 8.8* 9.1* 8.5* 9.0*  HCT 29.7*  < > 28.4* 28.3* 26.0* 28.2* 25.0* 28.0*  MCV 84.1  --  82.6 83.0  --  83.4  --  83.6  PLT 257  --  296 302  --  337  --  346  < > = values in this interval not displayed.  Cardiac Enzymes: No results for input(s): CKTOTAL, CKMB, CKMBINDEX, TROPONINI in the last 168 hours.  BNP: BNP (last 3 results)  Recent Labs  06/19/15 0923 05/13/16 0905  BNP 1,782.0* 2,790.0*    ProBNP (last 3 results) No results for input(s): PROBNP in the last 8760 hours.    Other results:  Imaging: Dg Chest Port 1 View  Result Date: 05/30/2016 CLINICAL DATA:  65 year old female post CABG.  Subsequent encounter. EXAM: PORTABLE CHEST 1 VIEW COMPARISON:  05/29/2016 FINDINGS: Right introducer catheter has been removed. Two right-sided central lines remain in place with tips in the region of the distal superior vena cava and right atrium. Left-sided chest tube in  place without pneumothorax detected. Post CABG.  Cardiomegaly. Central pulmonary vascular prominence. Left base subsegmental atelectasis. Small pleural effusion suspected and unchanged. IMPRESSION: Right-sided introducer catheter has been removed. Otherwise no significant change. Electronically Signed   By: Genia Del M.D.   On: 05/30/2016 07:34   Dg Chest Port 1 View  Result Date: 05/29/2016 CLINICAL DATA:  Pneumothorax EXAM: PORTABLE CHEST 1 VIEW COMPARISON:  05/28/2016 FINDINGS: Swan-Ganz catheter are removed with the right jugular introducer left in place. Right subclavian central venous catheter are, right PICC, and left chest tube stable. No pneumothorax. Cardiomegaly persists. Hazy central and basilar airspace disease persists. Small right pleural effusion. IMPRESSION: No significant change. Bilateral hazy airspace disease persists. No sign of pneumothorax with left chest tube in place. Electronically Signed   By: Marybelle Killings M.D.   On: 05/29/2016 08:41     Medications:     Scheduled Medications: . acetaminophen  1,000 mg Oral Q6H   Or  . acetaminophen (TYLENOL) oral liquid 160 mg/5 mL  1,000 mg Per Tube Q6H  . amiodarone  200 mg Oral BID  . aspirin EC  81 mg Oral Daily  . atorvastatin  40 mg Oral q1800  . bisacodyl  10 mg Oral Daily   Or  . bisacodyl  10 mg Rectal Daily  . clopidogrel  75 mg Oral Daily  . docusate sodium  200 mg Oral Daily  . guaiFENesin  600 mg Oral BID  . insulin aspart  0-15 Units Subcutaneous TID WC  . insulin aspart  0-5 Units Subcutaneous QHS  . insulin detemir  10 Units Subcutaneous BID  . metoCLOPramide (REGLAN) injection  10 mg Intravenous Q6H  . metoprolol tartrate  12.5 mg Oral BID   Or  . metoprolol tartrate  12.5 mg Per Tube BID  . pantoprazole  40 mg Oral Daily  . sodium chloride flush  10-40 mL Intracatheter Q12H  . sodium chloride flush  3 mL Intravenous Q12H  Infusions: . sodium chloride    . sodium chloride    . sodium chloride  20 mL/hr at 05/30/16 0400  . lactated ringers    . lactated ringers Stopped (05/29/16 1300)  . milrinone 0.2 mcg/kg/min (05/30/16 0400)  . nitroGLYCERIN Stopped (05/28/16 0700)  . phenylephrine (NEO-SYNEPHRINE) Adult infusion Stopped (05/28/16 0513)    PRN Medications: sodium chloride, metoprolol, midazolam, morphine injection, ondansetron (ZOFRAN) IV, oxyCODONE, promethazine, sodium chloride flush, sodium chloride flush, traMADol   Assessment:   1. A/C Systolic Heart Failure  2. NSTEMI with CAD-severe 2 vessel disease LAD RCA    --s/pg CABG 12/18 with LIMA-LAD, SVG-RCA and SVG-diagonal 3. AKI on CKD 4.Uncontrolled DM- Hgb AC 11.3  5. Hyperkalemia  6. Elevated TSH 7. COPD  8. PAF on 12/20 - resolved with IV amio   Plan/Discussion:    Doing well s/p CABG. Co-ox looks good. Will wean milrinone to 0.1. Stop dopamine. Creatinine improving. Continue to hold lasix.   Continue low-dose b-blocker as tolerated. No ACE/ARB with AKI. Continue ASA and statin  CTs to be pulled today per TCTS  Back in NSR after IV amio. Now on po. If recurs will need AC.    Length of Stay: 10   Glori Bickers MD 05/30/2016, 5:39 PM  Advanced Heart Failure Team Pager [phone] (M-F; [place])  Please contact [facility] for night-coverage after hours (4p -7a ) and weekends on [email or web address]

## 2016-05-30 NOTE — Progress Notes (Signed)
Hypoglycemic Event  CBG: 44  Treatment: D50 IV 50 mL  Symptoms: Sweaty  Follow-up CBG: [id number] CBG Result:146  Possible Reasons for Event: Unknown  Comments/MD notified:NA    Samule Ohm

## 2016-05-30 NOTE — Progress Notes (Signed)
Pt now rapid afib, rate 150 - 180. BP WNL, and pt asymptomatic. Dr Servando Snare notified and orders received. I will cont to monitor.

## 2016-05-30 NOTE — Progress Notes (Signed)
3 Days Post-Op Procedure(s) (LRB): CORONARY ARTERY BYPASS GRAFTING (CABG) x3 using left internal mammary artery and left greater saphenous vein harvested endoscopically (N/A) TRANSESOPHAGEAL ECHOCARDIOGRAM (TEE) (N/A) Subjective: postop renal failure ischemic cardiomyopathy Cont renal dopamine Objective: Vital signs in last 24 hours: Temp:  [98.3 F (36.8 C)-98.7 F (37.1 C)] 98.3 F (36.8 C) (12/21 0403) Pulse Rate:  [55-91] 65 (12/21 1700) Cardiac Rhythm: Normal sinus rhythm (12/21 0800) Resp:  [14-27] 21 (12/21 1700) BP: (84-156)/(57-95) 119/62 (12/21 1700) SpO2:  [85 %-98 %] 94 % (12/21 1700) Weight:  [153 lb 3.5 oz (69.5 kg)] 153 lb 3.5 oz (69.5 kg) (12/21 0500)  Hemodynamic parameters for last 24 hours: CVP:  [9 mmHg-10 mmHg] 10 mmHg  Intake/Output from previous day: 12/20 0701 - 12/21 0700 In: 1230.8 [P.O.:180; I.V.:1050.8] Out: 995 [Urine:875; Chest Tube:120] Intake/Output this shift: Total I/O In: 3890.8 [I.V.:3890.8] Out: 325 [Urine:325]  .pvteamo   Lab Results:  Recent Labs  05/29/16 0400 05/29/16 1703 05/30/16 0500  WBC 18.1*  --  16.6*  HGB 9.1* 8.5* 9.0*  HCT 28.2* 25.0* 28.0*  PLT 337  --  346   BMET:  Recent Labs  05/29/16 0400 05/29/16 1703 05/30/16 0500  NA 138 134* 136  K 4.4 4.5 4.3  CL 102 98* 102  CO2 27  --  27  GLUCOSE 156* 201* 83  BUN 54* 67* 65*  CREATININE 2.84* 3.00* 2.76*  CALCIUM 8.7*  --  8.6*    PT/INR: No results for input(s): LABPROT, INR in the last 72 hours. ABG    Component Value Date/Time   PHART 7.425 05/28/2016 0512   HCO3 25.9 05/28/2016 0512   TCO2 28 05/29/2016 1703   ACIDBASEDEF 1.0 05/27/2016 1417   O2SAT 64.1 05/30/2016 0505   CBG (last 3)   Recent Labs  05/30/16 1210 05/30/16 1244 05/30/16 1601  GLUCAP 232* 200* 162*    Assessment/Plan: S/P Procedure(s) (LRB): CORONARY ARTERY BYPASS GRAFTING (CABG) x3 using left internal mammary artery and left greater saphenous vein harvested  endoscopically (N/A) TRANSESOPHAGEAL ECHOCARDIOGRAM (TEE) (N/A) Mobilize Diabetes control Plan for transfer to step-down: see transfer orders renal dopamine   LOS: 10 days    Theresa Rogers 05/30/2016

## 2016-05-30 NOTE — Progress Notes (Signed)
Patient ID: Theresa Rogers, female   DOB: [birth year], 65 y.o.   MRN: [id number] EVENING ROUNDS NOTE :     [address]       [place] [address]             [phone]                 3 Days Post-Op Procedure(s) (LRB): CORONARY ARTERY BYPASS GRAFTING (CABG) x3 using left internal mammary artery and left greater saphenous vein harvested endoscopically (N/A) TRANSESOPHAGEAL ECHOCARDIOGRAM (TEE) (N/A)  Total Length of Stay:  LOS: 10 days  BP 123/65   Pulse 67   Temp 98.3 F (36.8 C) (Oral)   Resp 17   Ht 5\' 4"  (1.626 m)   Wt 153 lb 3.5 oz (69.5 kg)   SpO2 92%   BMI 26.30 kg/m   .Intake/Output      12/20 0701 - 12/21 0700 12/21 0701 - 12/22 0700   P.O. 180    I.V. (mL/kg) 1050.8 (15.1) 3962.5 (57)   Other     IV Piggyback     Total Intake(mL/kg) 1230.8 (17.7) 3962.5 (57)   Urine (mL/kg/hr) 875 (0.5) 325 (0.4)   Chest Tube 120 (0.1)    Total Output 995 325   Net +235.8 +3637.5          . sodium chloride    . sodium chloride    . sodium chloride 20 mL/hr at 05/30/16 0400  . DOPamine    . lactated ringers    . lactated ringers Stopped (05/29/16 1300)  . milrinone 0.125 mcg/kg/min (05/30/16 1801)  . nitroGLYCERIN Stopped (05/28/16 0700)  . phenylephrine (NEO-SYNEPHRINE) Adult infusion Stopped (05/28/16 0513)     Lab Results  Component Value Date   WBC 16.6 (H) 05/30/2016   HGB 9.0 (L) 05/30/2016   HCT 28.0 (L) 05/30/2016   PLT 346 05/30/2016   GLUCOSE 83 05/30/2016   CHOL 222 (H) 06/21/2015   TRIG 162 (H) 06/21/2015   HDL 41 06/21/2015   LDLCALC 149 (H) 06/21/2015   ALT 25 05/26/2016   AST 34 05/26/2016   NA 136 05/30/2016   K 4.3 05/30/2016   CL 102 05/30/2016   CREATININE 2.76 (H) 05/30/2016   BUN 65 (H) 05/30/2016   CO2 27 05/30/2016   TSH 8.764 (H) 05/22/2016   INR 1.31 05/27/2016   HGBA1C 11.2 (H) 05/22/2016   Cr 2.73, up in chair, feels faint when tries to walk  Grace Isaac MD  Beeper [phone] Office  [phone] 05/30/2016 6:44 PM

## 2016-05-31 ENCOUNTER — Inpatient Hospital Stay (HOSPITAL_COMMUNITY): Payer: Medicaid Other

## 2016-05-31 LAB — BASIC METABOLIC PANEL
Anion gap: 10 (ref 5–15)
BUN: 79 mg/dL — ABNORMAL HIGH (ref 6–20)
CO2: 23 mmol/L (ref 22–32)
Calcium: 8.3 mg/dL — ABNORMAL LOW (ref 8.9–10.3)
Chloride: 98 mmol/L — ABNORMAL LOW (ref 101–111)
Creatinine, Ser: 3.12 mg/dL — ABNORMAL HIGH (ref 0.44–1.00)
GFR calc Af Amer: 17 mL/min — ABNORMAL LOW (ref >60)
GFR calc non Af Amer: 15 mL/min — ABNORMAL LOW (ref >60)
Glucose, Bld: 333 mg/dL — ABNORMAL HIGH (ref 65–99)
Potassium: 5.2 mmol/L — ABNORMAL HIGH (ref 3.5–5.1)
Sodium: 131 mmol/L — ABNORMAL LOW (ref 135–145)

## 2016-05-31 LAB — CBC
HCT: 27.1 % — ABNORMAL LOW (ref 36.0–46.0)
Hemoglobin: 8.7 g/dL — ABNORMAL LOW (ref 12.0–15.0)
MCH: 27.2 pg (ref 26.0–34.0)
MCHC: 32.1 g/dL (ref 30.0–36.0)
MCV: 84.7 fL (ref 78.0–100.0)
Platelets: 344 10*3/uL (ref 150–400)
RBC: 3.2 MIL/uL — ABNORMAL LOW (ref 3.87–5.11)
RDW: 16.4 % — ABNORMAL HIGH (ref 11.5–15.5)
WBC: 13.4 10*3/uL — ABNORMAL HIGH (ref 4.0–10.5)

## 2016-05-31 LAB — GLUCOSE, CAPILLARY
GLUCOSE-CAPILLARY: 272 mg/dL — AB (ref 65–99)
Glucose-Capillary: 321 mg/dL — ABNORMAL HIGH (ref 65–99)
Glucose-Capillary: 397 mg/dL — ABNORMAL HIGH (ref 65–99)
Glucose-Capillary: 272 mg/dL — ABNORMAL HIGH (ref 65–99)

## 2016-05-31 LAB — COOXEMETRY PANEL
Carboxyhemoglobin: 1 % (ref 0.5–1.5)
Methemoglobin: 1.2 % (ref 0.0–1.5)
O2 Saturation: 64.6 %
Total hemoglobin: 9.2 g/dL — ABNORMAL LOW (ref 12.0–16.0)

## 2016-05-31 MED ORDER — AMIODARONE HCL 200 MG PO TABS
400.0000 mg | ORAL_TABLET | Freq: Two times a day (BID) | ORAL | Status: DC
  Administered 2016-05-31 – 2016-06-03 (×7): 400 mg via ORAL
  Filled 2016-05-31 (×7): qty 2

## 2016-05-31 MED ORDER — INSULIN DETEMIR 100 UNIT/ML ~~LOC~~ SOLN
15.0000 [IU] | Freq: Two times a day (BID) | SUBCUTANEOUS | Status: DC
  Administered 2016-05-31 – 2016-06-01 (×3): 15 [IU] via SUBCUTANEOUS
  Filled 2016-05-31 (×5): qty 0.15

## 2016-05-31 MED ORDER — DOPAMINE-DEXTROSE 3.2-5 MG/ML-% IV SOLN: 2.5000 ug/kg/min | INTRAVENOUS | Status: DC

## 2016-05-31 MED ORDER — ENOXAPARIN SODIUM 30 MG/0.3ML ~~LOC~~ SOLN
30.0000 mg | SUBCUTANEOUS | Status: DC
  Administered 2016-05-31 – 2016-06-07 (×8): 30 mg via SUBCUTANEOUS
  Filled 2016-05-31 (×8): qty 0.3

## 2016-05-31 NOTE — Progress Notes (Addendum)
Inpatient Diabetes Program Recommendations  AACE/ADA: New Consensus Statement on Inpatient Glycemic Control (2015)  Target Ranges:  Prepandial:   less than 140 mg/dL      Peak postprandial:   less than 180 mg/dL (1-2 hours)      Critically ill patients:  140 - 180 mg/dL   Lab Results  Component Value Date   GLUCAP 397 (H) 05/31/2016   HGBA1C 11.2 (H) 05/22/2016    Review of Glycemic Control:  Results for CANDISS, GALEANA (MRN [id number]) as of 05/31/2016 14:18  Ref. Range 05/30/2016 16:01 05/30/2016 19:28 05/31/2016 08:04 05/31/2016 12:02  Glucose-Capillary Latest Ref Range: 65 - 99 mg/dL 162 (H) 174 (H) 321 (H) 397 (H)   Outpatient Diabetes medications: Lantus 15 units daily, Humalog 5-12 units tid with meals Current orders for Inpatient glycemic control:  Levemir 10 units bid, Novolog moderate tid with meals  Inpatient Diabetes Program Recommendations:    Note that patient did not receive Levemir on 05/30/16 likely due to lows. Agree with current order of Levemir 10 units bid. Consider also adding Novolog meal coverage 3 units tid with meals.  Thanks, Adah Perl, RN, BC-ADM Inpatient Diabetes Coordinator Pager [phone] (8a-5p)

## 2016-05-31 NOTE — Progress Notes (Signed)
Advanced Heart Failure Rounding Note   Subjective:    Transferred from [facility] with volume overload despite IV diuresis + milrinone. Continues to diurese with IV lasix. CVP down to 11 Co-ox 65% on milrinone 0.71mcg/kg/min  Now s/p CABG x 3 on 05/27/16  Diuretics on hold due to AKI. Dopamine stopped yesterday. Remains on milrinone 0.1. Creatinine up to 3.00 -> 2.76 -> 3.12. Weight up 4 pounds overnight. 14 pounds from baseline  Co-ox 65% CVP 12  Had recurrent rapid AF last night. Now on IV amio.   Objective:   Weight Range:  Vital Signs:   Temp:  [97.4 F (36.3 C)-98.4 F (36.9 C)] 97.4 F (36.3 C) (12/22 0806) Pulse Rate:  [65-155] 70 (12/22 0600) Resp:  [12-27] 14 (12/22 0600) BP: (90-146)/(57-88) 138/74 (12/22 0600) SpO2:  [85 %-100 %] 98 % (12/22 0600) Weight:  [71.3 kg (157 lb 3 oz)] 71.3 kg (157 lb 3 oz) (12/22 0500) Last BM Date: 05/19/16  Weight change: Filed Weights   05/29/16 0500 05/30/16 0500 05/31/16 0500  Weight: 69.3 kg (152 lb 12.5 oz) 69.5 kg (153 lb 3.5 oz) 71.3 kg (157 lb 3 oz)    Intake/Output:   Intake/Output Summary (Last 24 hours) at 05/31/16 0920 Last data filed at 05/31/16 0700  Gross per 24 hour  Intake          4534.37 ml  Output              520 ml  Net          4014.37 ml     Physical Exam: General:  Lying in bed NAD HEENT: normal Neck: supple. JVP ~10 . Carotids 2+ bilat; no bruits. No lymphadenopathy or thryomegaly appreciated. Cor: sternal dressing RRR   + CTs Lungs: clear anteriorls Abdomen: soft, nontender, + distended. hypoactivebowel sounds. Extremities: no cyanosis, clubbing, rash, R and LLE trace edema Neuro: alert & orientedx3, cranial nerves grossly intact. moves all 4 extremities w/o difficulty. Affect pleasant  Telemetry: NSR 70s   Labs: Basic Metabolic Panel:  Recent Labs Lab 05/27/16 0439  05/27/16 2000  05/28/16 0420 05/28/16 1800 05/28/16 1804 05/29/16 0400 05/29/16 1703 05/30/16 0500 05/31/16 0500    NA 134*  < >  --   < > 139  --  138 138 134* 136 131*  K 4.2  < >  --   < > 4.3  --  4.3 4.4 4.5 4.3 5.2*  CL 95*  < >  --   < > 104  --  102 102 98* 102 98*  CO2 32  --   --   --  27  --   --  27  --  27 23  GLUCOSE 208*  < >  --   < > 134*  --  171* 156* 201* 83 333*  BUN 51*  < >  --   < > 43*  --  44* 54* 67* 65* 79*  CREATININE 1.70*  < > 1.61*  < > 1.71* 2.36* 2.10* 2.84* 3.00* 2.76* 3.12*  CALCIUM 8.4*  --   --   --  8.4*  --   --  8.7*  --  8.6* 8.3*  MG  --   --  3.0*  --  2.8* 2.8*  --   --   --   --   --   < > = values in this interval not displayed.  Liver Function Tests:  Recent Labs Lab 05/25/16 6962 05/26/16  0430  AST 23 34  ALT 24 25  ALKPHOS 204* 194*  BILITOT 0.6 1.1  PROT 5.6* 5.8*  ALBUMIN 2.4* 2.6*   No results for input(s): LIPASE, AMYLASE in the last 168 hours. No results for input(s): AMMONIA in the last 168 hours.  CBC:  Recent Labs Lab 05/28/16 0420 05/28/16 1800 05/28/16 1804 05/29/16 0400 05/29/16 1703 05/30/16 0500 05/31/16 0500  WBC 13.2* 16.8*  --  18.1*  --  16.6* 13.4*  HGB 9.2* 9.1* 8.8* 9.1* 8.5* 9.0* 8.7*  HCT 28.4* 28.3* 26.0* 28.2* 25.0* 28.0* 27.1*  MCV 82.6 83.0  --  83.4  --  83.6 84.7  PLT 296 302  --  337  --  346 344    Cardiac Enzymes: No results for input(s): CKTOTAL, CKMB, CKMBINDEX, TROPONINI in the last 168 hours.  BNP: BNP (last 3 results)  Recent Labs  06/19/15 0923 05/13/16 0905  BNP 1,782.0* 2,790.0*    ProBNP (last 3 results) No results for input(s): PROBNP in the last 8760 hours.    Other results:  Imaging: Dg Chest Port 1 View  Result Date: 05/31/2016 CLINICAL DATA:  CABG EXAM: PORTABLE CHEST 1 VIEW COMPARISON:  05/30/2016 FINDINGS: Left chest tube removed.  Negative for pneumothorax. Right arm PICC tip in the SVC. Right subclavian central venous catheter tip in the SVC. Bibasilar consolidation and small effusions bilaterally unchanged. Pulmonary vascular congestion shows slight progression.  IMPRESSION: Left chest tube removed.  No pneumothorax Progression of pulmonary vascular congestion Bibasilar consolidation and small pleural effusions unchanged. Electronically Signed   By: Franchot Gallo M.D.   On: 05/31/2016 07:20   Dg Chest Port 1 View  Result Date: 05/30/2016 CLINICAL DATA:  65 year old female post CABG.  Subsequent encounter. EXAM: PORTABLE CHEST 1 VIEW COMPARISON:  05/29/2016 FINDINGS: Right introducer catheter has been removed. Two right-sided central lines remain in place with tips in the region of the distal superior vena cava and right atrium. Left-sided chest tube in place without pneumothorax detected. Post CABG.  Cardiomegaly. Central pulmonary vascular prominence. Left base subsegmental atelectasis. Small pleural effusion suspected and unchanged. IMPRESSION: Right-sided introducer catheter has been removed. Otherwise no significant change. Electronically Signed   By: Genia Del M.D.   On: 05/30/2016 07:34     Medications:     Scheduled Medications: . acetaminophen  1,000 mg Oral Q6H   Or  . acetaminophen (TYLENOL) oral liquid 160 mg/5 mL  1,000 mg Per Tube Q6H  . amiodarone  400 mg Oral BID  . aspirin EC  81 mg Oral Daily  . atorvastatin  40 mg Oral q1800  . bisacodyl  10 mg Oral Daily   Or  . bisacodyl  10 mg Rectal Daily  . clopidogrel  75 mg Oral Daily  . docusate sodium  200 mg Oral Daily  . guaiFENesin  600 mg Oral BID  . insulin aspart  0-15 Units Subcutaneous TID WC  . insulin aspart  0-5 Units Subcutaneous QHS  . insulin detemir  10 Units Subcutaneous BID  . metoCLOPramide (REGLAN) injection  10 mg Intravenous Q6H  . metoprolol tartrate  12.5 mg Oral BID   Or  . metoprolol tartrate  12.5 mg Per Tube BID  . pantoprazole  40 mg Oral Daily  . sodium chloride flush  10-40 mL Intracatheter Q12H  . sodium chloride flush  3 mL Intravenous Q12H    Infusions: . sodium chloride    . sodium chloride    . sodium chloride 20  mL/hr at 05/31/16 0400    . amiodarone 30 mg/hr (05/31/16 0400)  . DOPamine 2.5 mcg/kg/min (05/31/16 0400)  . lactated ringers    . lactated ringers Stopped (05/29/16 1300)  . milrinone 0.125 mcg/kg/min (05/31/16 0400)  . nitroGLYCERIN Stopped (05/28/16 0700)  . phenylephrine (NEO-SYNEPHRINE) Adult infusion Stopped (05/28/16 0513)    PRN Medications: sodium chloride, metoprolol, midazolam, morphine injection, ondansetron (ZOFRAN) IV, oxyCODONE, promethazine, sodium chloride flush, sodium chloride flush, traMADol   Assessment:   1. A/C Systolic Heart Failure  2. NSTEMI with CAD-severe 2 vessel disease LAD RCA    --s/pg CABG 12/18 with LIMA-LAD, SVG-RCA and SVG-diagonal 3. AKI on CKD 4.Uncontrolled DM- Hgb AC 11.3  5. Hyperkalemia  6. Elevated TSH 7. COPD  8. PAF on 12/20 and 12/21 - resolved with IV amio   Plan/Discussion:    Doing well s/p CABG. Co-ox looks good. Will continue low-dose dopamine and milrinone until renal function stabilizes. Stop metoprolol.  Continue to hold lasix.   No ACE/ARB with AKI. Continue ASA and statin and plavix (previous stent)  Back in NSR after IV amio. If recurs will need to consider anticoagulation - discussed with Dr. Prescott Gum.   CBGs remain elevated. Insulin increased.  Can got to 2W. Ambulate.   Denika Krone,MD 7:36 PM   Length of Stay: 11   Aneesah Hernan MD 05/31/2016, 9:20 AM  Advanced Heart Failure Team Pager [phone] (M-F; [place])  Please contact [facility] for night-coverage after hours (4p -7a ) and weekends on [email or web address]

## 2016-05-31 NOTE — Discharge Summary (Signed)
Physician Discharge Summary       [address]       [place] [address]             [phone]    Patient ID: Theresa Rogers MRN: [id number] DOB/AGE: July 14, 1950 65 y.o.  Admit date: 05/20/2016 Discharge date: 06/14/2016  Admission Diagnoses: 1. Ischemic cardiomyopathy 2. NSTEMI 3. Coronary artery disease 4.  Acute on chronic systolic CHF (congestive heart failure) ([place])  Active Diagnoses:  1. HLD (hyperlipidemia) 2. HTN (hypertension) 3. CKD (chronic kidney disease), stage III 4. Diabetes mellitus with complication ([place]) 5. Pulmonary hypertension 6. PAF (paroxysmal atrial fibrillation) ([place]) 7. ABL anemia  Procedure (s):  Right/Left Heart Cath and Coronary Angiography by Arida on 05/20/2016:  Conclusion     Prox RCA lesion, 70 %stenosed.  Dist RCA lesion, 80 %stenosed.  Prox RCA to Mid RCA lesion, 0 %stenosed.  Mid Cx lesion, 0 %stenosed.  Dist LAD lesion, 90 %stenosed.  Mid LAD lesion, 95 %stenosed.  Prox Cx lesion, 30 %stenosed.  Mid RCA lesion, 90 %stenosed.  Hemodynamic findings consistent with moderate pulmonary hypertension.   1. Severely elevated filling pressures with an LVEDP of 33 mmHg, moderate pulmonary hypertension with a mean pressure of 40 mmHg and mildly reduced cardiac output at 3.98 with a cardiac index of 2.26. These numbers were on milrinone infusion and multiple days of IV diuresis.  2. Significant three-vessel coronary artery disease with patent stent in the left circumflex. Significant progression of mid LAD disease over the last 10 months and diffuse obstructive disease affecting the right coronary artery in multiple areas.  Recommendations: The patient has very aggressive diabetic disease. She should be evaluated for CABG. There seems to be a graftable area in the mid LAD and distal right coronary artery. PCI of LAD/RCA can be considered if deemed not a good candidate for CABG. The patient will require few days  of diuresis. I will transfer the patient to [facility] for [facility].    1. Coronary artery bypass grafting x3 (left internal mammary artery to     left anterior descending, saphenous vein graft to diagonal,     saphenous vein graft to distal right coronary artery). 2. Endoscopic harvest of left leg greater saphenous vein. 3. Placement of left femoral A-line for blood pressure monitoring by Dr. Prescott Gum on 05/27/2016.  History of Presenting Illness: Patient examined, coronary angiogram and 2-D echocardiogram personally reviewed and counseled with patient. Dr. Prescott Gum discussed the patient with the patient's cardiologist for coordination of care and plan for surgical revascularization probably next week  Very nice 65 year old Caucasian female with severe diabetic coronary artery disease, previous PCI, ischemic artery myopathy with class IV heart failure, and decline in functional status which led to her admission to [facility] on 05/13/2016.  Cardiac catheterization demonstrated patent PCI to the circumflex but progression of disease with high-grade stenoses in her RCA and LAD circulation. LVEDP was 35 mmHg, cardiac index was preserved at 2.2. She was placed on milrinone and underwent aggressive diuresis with improved symptoms and decrease in swelling. She was transferred to this hospital for further support and for evaluation for surgical coronary revascularization. The patient has chronic renal failure and her creatinine is being carefully monitored after her dye load yesterday. The patient has been on long-term Plavix for a year and her Plavix was stopped  05/20/2016.  The patient is right-hand dominant and had her right radial artery used for previous catheterization with apparent injury  to the radial artery not requiring surgical repair and the patient currently has a good right radial pulse.]  The patient blood sugars are poorly controlled with CABG 300-400.  Hemoglobin A1c is pending.  Echocardiogram showed biventricular dysfunction but no significant valvular abnormality.  She has had some intermittent atrial fibrillation with admissions for advanced heart failure but has never been on long-term and a correlation with Coumadin or Eliquis. She is currently in sinus rhythm.   Dr. Prescott Gum discussed the need for coronary artery bypass grafting surgery. Potential risks, benefits, and complications were discussed with the patient and she agreed to proceed with surgery. Pre operative carotid duplex US showed no significant internal carotid artery stenosis bilaterally. She underwent a CABG x 3 on 05/27/2016.  Brief Hospital Course:  The patient was extubated the morning of post perative day one. She remained afebrile and hemodynamically stable. She was on aMilrinone drip and heart failure followed her post operatively. Co ox % on 12/22 was 64.6. Milrinone was stopped. Gordy Councilman, a line, and foley were removed early in the post operative course. Chest tubes were removed on 12/22. Lopressor was started and titrated accordingly. She was volume over loaded and diuresed. She had ABL anemia. She did not require a post op transfusion. Last H and H was 9.2/27.4. She was weaned off the insulin drip. The patient's HGA1C pre op was 11.2.  The patient's glucose remained well controlled. She will need to obtain a medical doctor for further management of her diabetes and surveillance of HGA1C 11.2. Her creatinine upon admission was 1.81. She was on renal dopamine drip post op. Her creatinine went as high as 3.81 post op. She ultimately required a HD catheter and underwent hemodialysis. Nephrology continued to follow her closely. The temporary HD catheter was removed on 06/10/2016. She went into atrial fibrillation and was put on IV Amiodarone. The patient was felt surgically stable for transfer from the ICU to [facility] for further convalescence on 05/31/2016. She continues to  progress with cardiac rehab. She was ambulating on room air. She has been tolerating a diet and has had a bowel movement. She was placed on oral Amiodarone and remained in atrial fibrillation with a controlled ventricular rate. Epicardial pacing wires were removed. Chest tube sutures were removed the day of discharge. The patient is felt surgically stable for discharge today. Home health will draw an INR on Monday 06/17/2016 and report results to Dr. Haroldine Laws. After that, she will follow-up with the [facility] coumadin clinic for future INR draws.    Latest Vital Signs: Blood pressure (!) 120/59, pulse 60, temperature 97.6 F (36.4 C), temperature source Axillary, resp. rate 18, height 5\' 4"  (1.626 m), weight 146 lb 14.4 oz (66.6 kg), SpO2 95 %.  Physical Exam: General- alert and comfortable   Lungs- clear without rales, wheezes   Cor- regular rate and rhythm, no murmur , gallop   Abdomen- soft, non-tender   Extremities - warm, non-tender, minimal edema   Neuro- oriented, appropriate, no focal weakness  Discharge Condition:Stable and discharged to home with home health.  Recent laboratory studies:  Lab Results  Component Value Date   WBC 11.1 (H) 06/11/2016   HGB 9.2 (L) 06/11/2016   HCT 27.4 (L) 06/11/2016   MCV 78.7 06/11/2016   PLT 422 (H) 06/11/2016   Lab Results  Component Value Date   NA 133 (L) 06/14/2016   K 3.0 (L) 06/14/2016   CL 84 (L) 06/14/2016   CO2 35 (H) 06/14/2016  CREATININE 3.39 (H) 06/14/2016   GLUCOSE 48 (L) 06/14/2016    Diagnostic Studies:  US Venous Img Lower Bilateral  Result Date: 05/18/2016 CLINICAL DATA:  Bilateral lower extremity edema. History of pulmonary embolism. Evaluate for DVT. EXAM: BILATERAL LOWER EXTREMITY VENOUS DOPPLER ULTRASOUND TECHNIQUE: Gray-scale sonography with graded compression, as well as color Doppler and duplex ultrasound were performed to evaluate the lower extremity deep venous systems from the level of the common femoral  vein and including the common femoral, femoral, profunda femoral, popliteal and calf veins including the posterior tibial, peroneal and gastrocnemius veins when visible. The superficial great saphenous vein was also interrogated. Spectral Doppler was utilized to evaluate flow at rest and with distal augmentation maneuvers in the common femoral, femoral and popliteal veins. COMPARISON:  None. FINDINGS: RIGHT LOWER EXTREMITY Common Femoral Vein: No evidence of thrombus. Normal compressibility, respiratory phasicity and response to augmentation. Saphenofemoral Junction: No evidence of thrombus. Normal compressibility and flow on color Doppler imaging. Profunda Femoral Vein: No evidence of thrombus. Normal compressibility and flow on color Doppler imaging. Femoral Vein: No evidence of thrombus. Normal compressibility, respiratory phasicity and response to augmentation. Popliteal Vein: No evidence of thrombus. Normal compressibility, respiratory phasicity and response to augmentation. Calf Veins: No evidence of thrombus. Normal compressibility and flow on color Doppler imaging. Superficial Great Saphenous Vein: No evidence of thrombus. Normal compressibility and flow on color Doppler imaging. Venous Reflux:  None. Other Findings:  None. LEFT LOWER EXTREMITY Common Femoral Vein: No evidence of thrombus. Normal compressibility, respiratory phasicity and response to augmentation. Saphenofemoral Junction: No evidence of thrombus. Normal compressibility and flow on color Doppler imaging. Profunda Femoral Vein: No evidence of thrombus. Normal compressibility and flow on color Doppler imaging. Femoral Vein: No evidence of thrombus. Normal compressibility, respiratory phasicity and response to augmentation. Popliteal Vein: No evidence of thrombus. Normal compressibility, respiratory phasicity and response to augmentation. Calf Veins: No evidence of thrombus. Normal compressibility and flow on color Doppler imaging. Superficial  Great Saphenous Vein: No evidence of thrombus. Normal compressibility and flow on color Doppler imaging. Venous Reflux:  None. Other Findings:  None. IMPRESSION: No evidence of DVT within either lower extremity. Electronically Signed   By: Sandi Mariscal M.D.   On: 05/18/2016 13:31   Dg Chest Port 1 View  Result Date: 05/31/2016 CLINICAL DATA:  CABG EXAM: PORTABLE CHEST 1 VIEW COMPARISON:  05/30/2016 FINDINGS: Left chest tube removed.  Negative for pneumothorax. Right arm PICC tip in the SVC. Right subclavian central venous catheter tip in the SVC. Bibasilar consolidation and small effusions bilaterally unchanged. Pulmonary vascular congestion shows slight progression. IMPRESSION: Left chest tube removed.  No pneumothorax Progression of pulmonary vascular congestion Bibasilar consolidation and small pleural effusions unchanged. Electronically Signed   By: Franchot Gallo M.D.   On: 05/31/2016 07:20  Discharge Medications: Allergies as of 06/14/2016      Reactions   No Known Allergies       Medication List    STOP taking these medications   aspirin 81 MG EC tablet   carvedilol 6.25 MG tablet Commonly known as:  COREG   clopidogrel 75 MG tablet Commonly known as:  PLAVIX   guaiFENesin-codeine 100-10 MG/5ML syrup   isosorbide mononitrate 30 MG 24 hr tablet Commonly known as:  IMDUR     TAKE these medications   amiodarone 200 MG tablet Commonly known as:  PACERONE Take 1 tablet (200 mg total) by mouth 2 (two) times daily.   atorvastatin 40 MG tablet Commonly known  as:  LIPITOR Take 1 tablet (40 mg total) by mouth daily at 6 PM.   cephALEXin 500 MG capsule Commonly known as:  KEFLEX Take 1 capsule (500 mg total) by mouth every 8 (eight) hours.   coumadin book Misc 1 each by Does not apply route once.   furosemide 80 MG tablet Commonly known as:  LASIX Take 1 tablet (80 mg total) by mouth 2 (two) times daily. What changed:  medication strength  how much to take   insulin  glargine 100 UNIT/ML injection Commonly known as:  LANTUS Inject 15 Units into the skin at bedtime.   insulin lispro 100 UNIT/ML injection Commonly known as:  HUMALOG Inject 5-12 Units into the skin 3 (three) times daily before meals. Per sliding scale   oxyCODONE 5 MG immediate release tablet Commonly known as:  Oxy IR/ROXICODONE Take 1 tablet (5 mg total) by mouth every 6 (six) hours as needed for severe pain.   potassium chloride SA 20 MEQ tablet Commonly known as:  K-DUR,KLOR-CON Take 1 tablet (20 mEq total) by mouth 2 (two) times daily.   warfarin 2 MG tablet Commonly known as:  COUMADIN Take 1 tablet (2 mg total) by mouth daily at 6 PM.      The patient has been discharged on:   1.Beta Blocker:  Yes [   ]                              No   [ x  ]                              If No, reason: Bradycardia  2.Ace Inhibitor/ARB: Yes [   ]                                     No  [ x   ]                                     If No, reason: dialysis patient  3.Statin:   Yes [  x ]                  No  [   ]                  If No, reason:  4.Ecasa:  Yes  [x   ]                  No   [   ]                  If No, reason: Follow Up Appointments: Follow-up Information    Kathlyn Sacramento, MD Follow up.   Specialty:  Cardiology Contact information: [address] [place] [address] [phone]        Tharon Aquas Kerby Less III, MD Follow up on 07/10/2016.   Specialty:  Cardiothoracic Surgery Why:  PA/LAT CXR to be taken (at [facility] which is in the same building as Dr. Lucianne Lei Trigt's office) on 07/11/2015 at 11:30 am;Appointment time is at 12:00 pm Contact information: [address] [place] [address] [phone]  Medical Doctor Follow up.   Why:  Please obtain a medical doctor for further diabetes treatment and surveillanc of HGA1C 11.2.       [facility] Follow up on  06/19/2016.   Specialty:  Cardiology Why:  at 200 pm for post hospital follow up. Please bring all of your medications to your visit. The code for parking is 5000. Enter through the construction entrance off of [address].  Underground parking will be on your right.  Contact information: [address] [id number] [facility] [place] Follow up.   Why:  HHRN arranged- they will call you to set up home visits (She will need a PT/INR drawn on Monday 1/8. Results to Dr. Glori Bickers.) Contact information: [address] [place] [address] [phone]        Lavonia Dana, MD. Call in 1 day(s).   Specialty:  Internal Medicine Why:  Please follow-up in 1 week.  Contact information: [place] [address] [phone]        Lavonia Dana, MD Follow up.   Specialty:  Internal Medicine Contact information: [address] [place] [address] [phone]        [facility] coumadin clinic. Call in 1 day(s).   Why:  You will need to call for an appointment after the initial INR draw on Monday 06/17/2016 Contact information: [place] [address] [place]. [place] [address]  phone: [phone]          Signed: Terance Hart ContePA-C 06/14/2016, 12:27 PM

## 2016-05-31 NOTE — Progress Notes (Signed)
Pt has arrived to 2w from 2s. Telemetry box applied and CCMD notified. Pt arrived with epicardial pacing wires attached to external pacer. Pacer is set at atrial pacing at a rate of 90 at 13 milliamps. Pt has milrinone and dopamine drips running. Vitals are stable. Pt has been oriented to room. Pt reports no needs at this time. Will continue current plan of care.   Grant Fontana BSN, RN

## 2016-05-31 NOTE — Progress Notes (Signed)
4 Days Post-Op Procedure(s) (LRB): CORONARY ARTERY BYPASS GRAFTING (CABG) x3 using left internal mammary artery and left greater saphenous vein harvested endoscopically (N/A) TRANSESOPHAGEAL ECHOCARDIOGRAM (TEE) (N/A) Subjective: Status post CABG for ischemic cardiomyopathy, severe LV dysfunction and preop class IV heart failure on milrinone\ Poorly controlled diabetes A1c 11.3 Preoperative chronic renal insufficiency now with acute on chronic renal insufficiency creatinine back to 3.1 Carboxyhemoglobin satisfactory on low-dose milrinone Urine output adequate on renal dopamine, Lasix on hold Brief episode of atrial fibrillation last night, oral dose increased   Forward milligrams by mouth twice a day Patient's blood sugars greater than 300 this a.m.--will increase Levemir dosing twice a day  Objective: Vital signs in last 24 hours: Temp:  [97.4 F (36.3 C)-98.4 F (36.9 C)] 97.4 F (36.3 C) (12/22 0806) Pulse Rate:  [65-155] 70 (12/22 0600) Cardiac Rhythm: Normal sinus rhythm (12/22 0400) Resp:  [12-27] 14 (12/22 0600) BP: (90-146)/(57-88) 138/74 (12/22 0600) SpO2:  [85 %-100 %] 98 % (12/22 0600) Weight:  [157 lb 3 oz (71.3 kg)] 157 lb 3 oz (71.3 kg) (12/22 0500)  Hemodynamic parameters for last 24 hours:  sinus rhythm  Intake/Output from previous day: 12/21 0701 - 12/22 0700 In: 4562 [P.O.:120; I.V.:4442] Out: 620 [Urine:620] Intake/Output this shift: No intake/output data recorded.       Exam    General- alert and comfortable   Lungs- clear without rales, wheezes   Cor- regular rate and rhythm, no murmur , gallop   Abdomen- soft, non-tender   Extremities - warm, non-tender, minimal edema   Neuro- oriented, appropriate, no focal weakness   Lab Results:  Recent Labs  05/30/16 0500 05/31/16 0500  WBC 16.6* 13.4*  HGB 9.0* 8.7*  HCT 28.0* 27.1*  PLT 346 344   BMET:  Recent Labs  05/30/16 0500 05/31/16 0500  NA 136 131*  K 4.3 5.2*  CL 102 98*  CO2 27 23   GLUCOSE 83 333*  BUN 65* 79*  CREATININE 2.76* 3.12*  CALCIUM 8.6* 8.3*    PT/INR: No results for input(s): LABPROT, INR in the last 72 hours. ABG    Component Value Date/Time   PHART 7.425 05/28/2016 0512   HCO3 25.9 05/28/2016 0512   TCO2 28 05/29/2016 1703   ACIDBASEDEF 1.0 05/27/2016 1417   O2SAT 64.6 05/31/2016 0500   CBG (last 3)   Recent Labs  05/30/16 1601 05/30/16 1928 05/31/16 0804  GLUCAP 162* 174* 321*   Chest x-ray with minimal edema no significant effusion  Assessment/Plan: S/P Procedure(s) (LRB): CORONARY ARTERY BYPASS GRAFTING (CABG) x3 using left internal mammary artery and left greater saphenous vein harvested endoscopically (N/A) TRANSESOPHAGEAL ECHOCARDIOGRAM (TEE) (N/A) Acute on chronic renal failure-continue renal dopamine until creatinine shows significant improvement-baseline creatinine 1.7 Patient resumed on Plavix for preoperative PCI-hold Coumadin  for atrial fibrillation at this time. Keep in ICU for her acute renal failure, heart failure, poor LV function, poorly controlled diabetes  LOS: 11 days    Theresa Rogers 05/31/2016

## 2016-06-01 ENCOUNTER — Inpatient Hospital Stay (HOSPITAL_COMMUNITY): Payer: Medicaid Other

## 2016-06-01 LAB — URINALYSIS, ROUTINE W REFLEX MICROSCOPIC
BILIRUBIN URINE: NEGATIVE
Bacteria, UA: NONE SEEN
GLUCOSE, UA: NEGATIVE mg/dL
Ketones, ur: NEGATIVE mg/dL
Nitrite: NEGATIVE
PH: 5 (ref 5.0–8.0)
Protein, ur: 100 mg/dL — AB
SPECIFIC GRAVITY, URINE: 1.017 (ref 1.005–1.030)
SQUAMOUS EPITHELIAL / LPF: NONE SEEN

## 2016-06-01 LAB — CBC
HCT: 27.6 % — ABNORMAL LOW (ref 36.0–46.0)
Hemoglobin: 9.2 g/dL — ABNORMAL LOW (ref 12.0–15.0)
MCH: 27 pg (ref 26.0–34.0)
MCHC: 33.3 g/dL (ref 30.0–36.0)
MCV: 80.9 fL (ref 78.0–100.0)
Platelets: 437 10*3/uL — ABNORMAL HIGH (ref 150–400)
RBC: 3.41 MIL/uL — ABNORMAL LOW (ref 3.87–5.11)
RDW: 15.9 % — ABNORMAL HIGH (ref 11.5–15.5)
WBC: 14.9 10*3/uL — ABNORMAL HIGH (ref 4.0–10.5)

## 2016-06-01 LAB — COOXEMETRY PANEL
Carboxyhemoglobin: 1.3 % (ref 0.5–1.5)
Methemoglobin: 0.7 % (ref 0.0–1.5)
O2 Saturation: 46 %
Total hemoglobin: 9.3 g/dL — ABNORMAL LOW (ref 12.0–16.0)

## 2016-06-01 LAB — GLUCOSE, CAPILLARY
GLUCOSE-CAPILLARY: 156 mg/dL — AB (ref 65–99)
GLUCOSE-CAPILLARY: 238 mg/dL — AB (ref 65–99)
GLUCOSE-CAPILLARY: 144 mg/dL — AB (ref 65–99)
Glucose-Capillary: 210 mg/dL — ABNORMAL HIGH (ref 65–99)

## 2016-06-01 LAB — BASIC METABOLIC PANEL
Anion gap: 9 (ref 5–15)
BUN: 90 mg/dL — ABNORMAL HIGH (ref 6–20)
CO2: 24 mmol/L (ref 22–32)
Calcium: 8.5 mg/dL — ABNORMAL LOW (ref 8.9–10.3)
Chloride: 99 mmol/L — ABNORMAL LOW (ref 101–111)
Creatinine, Ser: 3 mg/dL — ABNORMAL HIGH (ref 0.44–1.00)
GFR calc Af Amer: 18 mL/min — ABNORMAL LOW (ref >60)
GFR calc non Af Amer: 15 mL/min — ABNORMAL LOW (ref >60)
Glucose, Bld: 164 mg/dL — ABNORMAL HIGH (ref 65–99)
Potassium: 4.5 mmol/L (ref 3.5–5.1)
Sodium: 132 mmol/L — ABNORMAL LOW (ref 135–145)

## 2016-06-01 MED ORDER — FUROSEMIDE 10 MG/ML IJ SOLN
80.0000 mg | Freq: Two times a day (BID) | INTRAMUSCULAR | Status: DC
  Administered 2016-06-01 – 2016-06-02 (×4): 80 mg via INTRAVENOUS
  Filled 2016-06-01 (×4): qty 8

## 2016-06-01 MED ORDER — AMIODARONE IV BOLUS ONLY 150 MG/100ML
150.0000 mg | Freq: Once | INTRAVENOUS | Status: AC
  Administered 2016-06-01: 150 mg via INTRAVENOUS
  Filled 2016-06-01: qty 100

## 2016-06-01 MED ORDER — AMIODARONE HCL IN DEXTROSE 360-4.14 MG/200ML-% IV SOLN
30.0000 mg/h | INTRAVENOUS | Status: DC
  Administered 2016-06-01 – 2016-06-05 (×7): 30 mg/h via INTRAVENOUS
  Filled 2016-06-01 (×9): qty 200

## 2016-06-01 MED ORDER — AMIODARONE LOAD VIA INFUSION: 150.0000 mg | Freq: Once | INTRAVENOUS | Status: DC

## 2016-06-01 MED ORDER — MILRINONE LACTATE IN DEXTROSE 20-5 MG/100ML-% IV SOLN
0.1250 ug/kg/min | INTRAVENOUS | Status: DC
  Administered 2016-06-02 – 2016-06-11 (×14): 0.25 ug/kg/min via INTRAVENOUS
  Filled 2016-06-01 (×15): qty 100

## 2016-06-01 MED ORDER — AMIODARONE HCL IN DEXTROSE 360-4.14 MG/200ML-% IV SOLN
60.0000 mg/h | INTRAVENOUS | Status: AC
  Administered 2016-06-01: 60 mg/h via INTRAVENOUS

## 2016-06-01 MED ORDER — AMIODARONE HCL IN DEXTROSE 360-4.14 MG/200ML-% IV SOLN
60.0000 mg/h | INTRAVENOUS | Status: DC
  Administered 2016-06-01: 60 mg/h via INTRAVENOUS

## 2016-06-01 MED ORDER — AMIODARONE HCL IN DEXTROSE 360-4.14 MG/200ML-% IV SOLN: 30.0000 mg/h | INTRAVENOUS | Status: DC

## 2016-06-01 NOTE — Progress Notes (Addendum)
Theresa Rogers       [place] [address]             [phone]      5 Days Post-Op Procedure(s) (LRB): CORONARY ARTERY BYPASS GRAFTING (CABG) x3 using left internal mammary artery and left greater saphenous vein harvested endoscopically (N/A) TRANSESOPHAGEAL ECHOCARDIOGRAM (TEE) (N/A)   Subjective:  No specific complaints.  She does have a cough with difficulty expectorating sputum.  She is on Mucinex.  Objective: Vital signs in last 24 hours: Temp:  [98 F (36.7 C)-99 F (37.2 C)] 98.5 F (36.9 C) (12/23 0407) Pulse Rate:  [67-90] 89 (12/23 0407) Cardiac Rhythm: Atrial paced (12/22 1900) Resp:  [0-19] 18 (12/23 0407) BP: (109-142)/(56-97) 142/81 (12/23 0407) SpO2:  [89 %-98 %] 90 % (12/23 0407) Weight:  [160 lb (72.6 kg)] 160 lb (72.6 kg) (12/23 0407)  Hemodynamic parameters for last 24 hours: CVP:  [7 mmHg] 7 mmHg  Intake/Output from previous day: 12/22 0701 - 12/23 0700 In: 942.3 [P.O.:720; I.V.:222.3] Out: 630 [Urine:630]  General appearance: alert, cooperative and no distress Heart: regular rate and rhythm Lungs: clear to auscultation bilaterally Abdomen: soft, non-tender; bowel sounds normal; no masses,  no organomegaly Extremities: edema trace Wound: clean and dry  Lab Results:  Recent Labs  05/31/16 0500 06/01/16 0412  WBC 13.4* 14.9*  HGB 8.7* 9.2*  HCT 27.1* 27.6*  PLT 344 437*   BMET:  Recent Labs  05/31/16 0500 06/01/16 0412  NA 131* 132*  K 5.2* 4.5  CL 98* 99*  CO2 23 24  GLUCOSE 333* 164*  BUN 79* 90*  CREATININE 3.12* 3.00*  CALCIUM 8.3* 8.5*    PT/INR: No results for input(s): LABPROT, INR in the last 72 hours. ABG    Component Value Date/Time   PHART 7.425 05/28/2016 0512   HCO3 25.9 05/28/2016 0512   TCO2 28 05/29/2016 1703   ACIDBASEDEF 1.0 05/27/2016 1417   O2SAT 46.0 06/01/2016 0420   CBG (last 3)   Recent Labs  05/31/16 1634 05/31/16 2127 06/01/16 0624  GLUCAP 272* 272* 156*     Assessment/Plan: S/P Procedure(s) (LRB): CORONARY ARTERY BYPASS GRAFTING (CABG) x3 using left internal mammary artery and left greater saphenous vein harvested endoscopically (N/A) TRANSESOPHAGEAL ECHOCARDIOGRAM (TEE) (N/A)  1. CV- A/C Systolic HF, CO-OX 50%  Hypertensive at times, Previous A. Fib maintaining NSR- currently on IV Milrinone, Dopamine... Amiodarone... Also remains on temp pacer at 90 2. Pulm- no acute issues, continue IS 3. Renal- weight is trending up, mild edema on exam. Creatinine is at 3.00- per HF Lasix drip restarted, may ultimately benefit from Nephrology consult 4. GU- UA ordered, + Blood, no definitive infection on dipstick, culture ordered by Dr. Haroldine Laws, no ABX at this time 5. DM- uncontrolled, sugar high all day yesterday, this mornings was okay... Continue Levemir at new regimen, if sugars remain high will adjust meal coverage 6. Dispo- patient remains on drips per HF, rising creatinine, may benefit from Nephrology consult, watch DM, continue current care   LOS: 12 days    BARRETT, ERIN 06/01/2016  I have seen and examined the patient and agree with the assessment and plan as outlined.  Now in Afib w/ HR 130.  Temporary pacer still firing inappropriately - set on AAI pacing at 90.  Will turn pacer off, stop dopamine, and resume amiodarone drip w/ IV bolus.  May need to consider starting warfarin if Afib persists but will hold off for now since  she's on DAPT and low dose lovenox.  Rexene Alberts, MD 06/01/2016 10:20 AM

## 2016-06-01 NOTE — Progress Notes (Signed)
Advanced Heart Failure Rounding Note   Subjective:    Transferred from [facility] with volume overload despite IV diuresis + milrinone. Continues to diurese with IV lasix. CVP down to 11 Co-ox 65% on milrinone 0.32mcg/kg/min  Now s/p CABG x 3 on 05/27/16  Diuretics on hold due to AKI. Remains on low dose dopamine and milrinone 0.1. On IV amio for PAF. Feels ok. Wants to go home.  Co-ox down to 46%  Creatinine 3.00 -> 2.76 -> 3.12-> 3.0 . Weight up another 3 pounds overnight. 17 pounds from baseline  WBC remains elevated    Objective:   Weight Range:  Vital Signs:   Temp:  [97.4 F (36.3 C)-99 F (37.2 C)] 98.5 F (36.9 C) (12/23 0407) Pulse Rate:  [67-90] 89 (12/23 0407) Resp:  [0-22] 18 (12/23 0407) BP: (109-142)/(56-97) 142/81 (12/23 0407) SpO2:  [89 %-98 %] 90 % (12/23 0407) Weight:  [72.6 kg (160 lb)] 72.6 kg (160 lb) (12/23 0407) Last BM Date: 05/25/16  Weight change: Filed Weights   05/30/16 0500 05/31/16 0500 06/01/16 0407  Weight: 69.5 kg (153 lb 3.5 oz) 71.3 kg (157 lb 3 oz) 72.6 kg (160 lb)    Intake/Output:   Intake/Output Summary (Last 24 hours) at 06/01/16 0533 Last data filed at 06/01/16 0300  Gross per 24 hour  Intake           1027.1 ml  Output              595 ml  Net            432.1 ml     Physical Exam: General:  Lying in bed NAD HEENT: normal Neck: supple. JVP ~10 . Carotids 2+ bilat; no bruits. No lymphadenopathy or thryomegaly appreciated. Cor: sternal dressing RRR   + CTs Lungs: clear anteriorly Abdomen: soft, nontender, + distended. hypoactivebowel sounds. Extremities: no cyanosis, clubbing, rash, 1+ edema Neuro: alert & orientedx3, cranial nerves grossly intact. moves all 4 extremities w/o difficulty. Affect pleasant  Telemetry: NSR 70s   Labs: Basic Metabolic Panel:  Recent Labs Lab 05/27/16 2000  05/28/16 0420 05/28/16 1800  05/29/16 0400 05/29/16 1703 05/30/16 0500 05/31/16 0500 06/01/16 0412  NA  --   < > 139  --    < > 138 134* 136 131* 132*  K  --   < > 4.3  --   < > 4.4 4.5 4.3 5.2* 4.5  CL  --   < > 104  --   < > 102 98* 102 98* 99*  CO2  --   --  27  --   --  27  --  27 23 24   GLUCOSE  --   < > 134*  --   < > 156* 201* 83 333* 164*  BUN  --   < > 43*  --   < > 54* 67* 65* 79* 90*  CREATININE 1.61*  < > 1.71* 2.36*  < > 2.84* 3.00* 2.76* 3.12* 3.00*  CALCIUM  --   --  8.4*  --   --  8.7*  --  8.6* 8.3* 8.5*  MG 3.0*  --  2.8* 2.8*  --   --   --   --   --   --   < > = values in this interval not displayed.  Liver Function Tests:  Recent Labs Lab 05/26/16 0430  AST 34  ALT 25  ALKPHOS 194*  BILITOT 1.1  PROT 5.8*  ALBUMIN  2.6*   No results for input(s): LIPASE, AMYLASE in the last 168 hours. No results for input(s): AMMONIA in the last 168 hours.  CBC:  Recent Labs Lab 05/28/16 1800  05/29/16 0400 05/29/16 1703 05/30/16 0500 05/31/16 0500 06/01/16 0412  WBC 16.8*  --  18.1*  --  16.6* 13.4* 14.9*  HGB 9.1*  < > 9.1* 8.5* 9.0* 8.7* 9.2*  HCT 28.3*  < > 28.2* 25.0* 28.0* 27.1* 27.6*  MCV 83.0  --  83.4  --  83.6 84.7 80.9  PLT 302  --  337  --  346 344 437*  < > = values in this interval not displayed.  Cardiac Enzymes: No results for input(s): CKTOTAL, CKMB, CKMBINDEX, TROPONINI in the last 168 hours.  BNP: BNP (last 3 results)  Recent Labs  06/19/15 0923 05/13/16 0905  BNP 1,782.0* 2,790.0*    ProBNP (last 3 results) No results for input(s): PROBNP in the last 8760 hours.    Other results:  Imaging: Dg Chest Port 1 View  Result Date: 05/31/2016 CLINICAL DATA:  CABG EXAM: PORTABLE CHEST 1 VIEW COMPARISON:  05/30/2016 FINDINGS: Left chest tube removed.  Negative for pneumothorax. Right arm PICC tip in the SVC. Right subclavian central venous catheter tip in the SVC. Bibasilar consolidation and small effusions bilaterally unchanged. Pulmonary vascular congestion shows slight progression. IMPRESSION: Left chest tube removed.  No pneumothorax Progression of  pulmonary vascular congestion Bibasilar consolidation and small pleural effusions unchanged. Electronically Signed   By: Franchot Gallo M.D.   On: 05/31/2016 07:20   Dg Chest Port 1 View  Result Date: 05/30/2016 CLINICAL DATA:  65 year old female post CABG.  Subsequent encounter. EXAM: PORTABLE CHEST 1 VIEW COMPARISON:  05/29/2016 FINDINGS: Right introducer catheter has been removed. Two right-sided central lines remain in place with tips in the region of the distal superior vena cava and right atrium. Left-sided chest tube in place without pneumothorax detected. Post CABG.  Cardiomegaly. Central pulmonary vascular prominence. Left base subsegmental atelectasis. Small pleural effusion suspected and unchanged. IMPRESSION: Right-sided introducer catheter has been removed. Otherwise no significant change. Electronically Signed   By: Genia Del M.D.   On: 05/30/2016 07:34     Medications:     Scheduled Medications: . acetaminophen  1,000 mg Oral Q6H   Or  . acetaminophen (TYLENOL) oral liquid 160 mg/5 mL  1,000 mg Per Tube Q6H  . amiodarone  400 mg Oral BID  . aspirin EC  81 mg Oral Daily  . atorvastatin  40 mg Oral q1800  . bisacodyl  10 mg Oral Daily   Or  . bisacodyl  10 mg Rectal Daily  . clopidogrel  75 mg Oral Daily  . docusate sodium  200 mg Oral Daily  . enoxaparin (LOVENOX) injection  30 mg Subcutaneous Q24H  . guaiFENesin  600 mg Oral BID  . insulin aspart  0-15 Units Subcutaneous TID WC  . insulin aspart  0-5 Units Subcutaneous QHS  . insulin detemir  15 Units Subcutaneous BID  . metoCLOPramide (REGLAN) injection  10 mg Intravenous Q6H  . pantoprazole  40 mg Oral Daily  . sodium chloride flush  10-40 mL Intracatheter Q12H  . sodium chloride flush  3 mL Intravenous Q12H    Infusions: . sodium chloride    . sodium chloride 20 mL/hr at 05/31/16 0400  . DOPamine 2.5 mcg/kg/min (05/31/16 1500)  . milrinone 0.125 mcg/kg/min (05/31/16 1506)    PRN  Medications: ondansetron (ZOFRAN) IV, oxyCODONE, promethazine, sodium chloride  flush, sodium chloride flush, traMADol   Assessment:   1. A/C Systolic Heart Failure  2. NSTEMI with CAD-severe 2 vessel disease LAD RCA    --s/pg CABG 12/18 with LIMA-LAD, SVG-RCA and SVG-diagonal 3. AKI on CKD 4.Uncontrolled DM- Hgb AC 11.3  5. Hyperkalemia  6. Elevated TSH 7. COPD  8. PAF on 12/20 and 12/21 - resolved with IV amio   Plan/Discussion:    Volume status worsening. Co-ox down. Will restart IV lasix. Increase milrinone to 0.25  No ACE/ARB with AKI. Continue ASA and statin and plavix (previous stent)  Back in NSR after IV amio. If recurs will need to consider anticoagulation - discussed with Dr. Prescott Gum. Continue IV amio  CBGs remain elevated. Insulin increased. Now improved  WBC elevated. Check UA.   Ambulate.   Ilean Spradlin,MD 5:33 AM   Length of Stay: 12   Saraia Platner MD 06/01/2016, 5:33 AM  Advanced Heart Failure Team Pager [phone] (M-F; 7a - 4p)  Please contact [facility] for night-coverage after hours (4p -7a ) and weekends on [email or web address]

## 2016-06-01 NOTE — Progress Notes (Signed)
Cardiac Rehab Progress Note: Cardiac Rehab in to see patient. It is noted HR 140s. Spoke with primary floor RN. Jeneen Rinks requested that we not walk with patient. Attempting to notify MD for further orders. Will follow up with patient on Tuesday. Patient encouraged to ambulate once cleared by RN or MD.

## 2016-06-02 LAB — CBC
HCT: 23.8 % — ABNORMAL LOW (ref 36.0–46.0)
Hemoglobin: 7.8 g/dL — ABNORMAL LOW (ref 12.0–15.0)
MCH: 26.5 pg (ref 26.0–34.0)
MCHC: 32.8 g/dL (ref 30.0–36.0)
MCV: 81 fL (ref 78.0–100.0)
Platelets: 429 10*3/uL — ABNORMAL HIGH (ref 150–400)
RBC: 2.94 MIL/uL — ABNORMAL LOW (ref 3.87–5.11)
RDW: 16 % — ABNORMAL HIGH (ref 11.5–15.5)
WBC: 14.2 10*3/uL — ABNORMAL HIGH (ref 4.0–10.5)

## 2016-06-02 LAB — PREPARE RBC (CROSSMATCH)

## 2016-06-02 LAB — BASIC METABOLIC PANEL
Anion gap: 9 (ref 5–15)
BUN: 93 mg/dL — ABNORMAL HIGH (ref 6–20)
CO2: 23 mmol/L (ref 22–32)
Calcium: 8.2 mg/dL — ABNORMAL LOW (ref 8.9–10.3)
Chloride: 99 mmol/L — ABNORMAL LOW (ref 101–111)
Creatinine, Ser: 3.08 mg/dL — ABNORMAL HIGH (ref 0.44–1.00)
GFR calc Af Amer: 17 mL/min — ABNORMAL LOW (ref >60)
GFR calc non Af Amer: 15 mL/min — ABNORMAL LOW (ref >60)
Glucose, Bld: 57 mg/dL — ABNORMAL LOW (ref 65–99)
Potassium: 3.8 mmol/L (ref 3.5–5.1)
Sodium: 131 mmol/L — ABNORMAL LOW (ref 135–145)

## 2016-06-02 LAB — COOXEMETRY PANEL
Carboxyhemoglobin: 0.7 % (ref 0.5–1.5)
Methemoglobin: 0.9 % (ref 0.0–1.5)
O2 Saturation: 51.8 %
Total hemoglobin: 16.7 g/dL — ABNORMAL HIGH (ref 12.0–16.0)

## 2016-06-02 LAB — GLUCOSE, CAPILLARY
GLUCOSE-CAPILLARY: 237 mg/dL — AB (ref 65–99)
Glucose-Capillary: 60 mg/dL — ABNORMAL LOW (ref 65–99)
Glucose-Capillary: 236 mg/dL — ABNORMAL HIGH (ref 65–99)
Glucose-Capillary: 172 mg/dL — ABNORMAL HIGH (ref 65–99)

## 2016-06-02 LAB — URINE CULTURE: Culture: NO GROWTH

## 2016-06-02 MED ORDER — METOLAZONE 2.5 MG PO TABS
2.5000 mg | ORAL_TABLET | Freq: Once | ORAL | Status: AC
  Administered 2016-06-02: 2.5 mg via ORAL
  Filled 2016-06-02: qty 1

## 2016-06-02 MED ORDER — WARFARIN SODIUM 2.5 MG PO TABS
2.5000 mg | ORAL_TABLET | Freq: Every day | ORAL | Status: DC
  Administered 2016-06-02 – 2016-06-09 (×8): 2.5 mg via ORAL
  Filled 2016-06-02 (×8): qty 1

## 2016-06-02 MED ORDER — COUMADIN BOOK
Freq: Once | Status: AC
  Administered 2016-06-02: 13:00:00
  Filled 2016-06-02: qty 1

## 2016-06-02 MED ORDER — POTASSIUM CHLORIDE CRYS ER 20 MEQ PO TBCR
40.0000 meq | EXTENDED_RELEASE_TABLET | Freq: Every day | ORAL | Status: DC
  Administered 2016-06-02: 40 meq via ORAL
  Filled 2016-06-02: qty 2

## 2016-06-02 MED ORDER — SODIUM CHLORIDE 0.9 % IV SOLN
Freq: Once | INTRAVENOUS | Status: AC
  Administered 2016-06-02: 11:00:00 via INTRAVENOUS

## 2016-06-02 MED ORDER — WARFARIN - PHARMACIST DOSING INPATIENT: Freq: Every day | Status: DC

## 2016-06-02 MED ORDER — SODIUM CHLORIDE 0.9% FLUSH
10.0000 mL | INTRAVENOUS | Status: DC | PRN
  Administered 2016-06-02: 30 mL
  Administered 2016-06-04: 10 mL
  Administered 2016-06-05: 20 mL
  Administered 2016-06-07 – 2016-06-14 (×5): 10 mL
  Filled 2016-06-02 (×7): qty 40

## 2016-06-02 MED ORDER — LACTULOSE 10 GM/15ML PO SOLN
20.0000 g | Freq: Once | ORAL | Status: DC
  Filled 2016-06-02 (×3): qty 30

## 2016-06-02 MED ORDER — WARFARIN VIDEO
Freq: Once | Status: AC
  Administered 2016-06-02: 13:00:00

## 2016-06-02 MED ORDER — INSULIN DETEMIR 100 UNIT/ML ~~LOC~~ SOLN
15.0000 [IU] | Freq: Every day | SUBCUTANEOUS | Status: DC
  Administered 2016-06-03 – 2016-06-06 (×4): 15 [IU] via SUBCUTANEOUS
  Filled 2016-06-02 (×4): qty 0.15

## 2016-06-02 NOTE — Progress Notes (Addendum)
[address]       [place] [address]             [phone]      6 Days Post-Op Procedure(s) (LRB): CORONARY ARTERY BYPASS GRAFTING (CABG) x3 using left internal mammary artery and left greater saphenous vein harvested endoscopically (N/A) TRANSESOPHAGEAL ECHOCARDIOGRAM (TEE) (N/A)   Subjective:  Patient had a rough day yesterday.  Also states her sugar is low.  She is ambulating with assistance.  Has not yet moved her bowels.  Objective: Vital signs in last 24 hours: Temp:  [97.8 F (36.6 C)-98.2 F (36.8 C)] 97.8 F (36.6 C) (12/24 0351) Pulse Rate:  [69-80] 80 (12/24 0351) Cardiac Rhythm: Normal sinus rhythm (12/23 1900) Resp:  [18] 18 (12/24 0351) BP: (101-121)/(67-68) 114/68 (12/24 0351) SpO2:  [88 %-98 %] 96 % (12/24 0351) Weight:  [161 lb 9.6 oz (73.3 kg)] 161 lb 9.6 oz (73.3 kg) (12/24 0351)  Intake/Output from previous day: 12/23 0701 - 12/24 0700 In: 240 [P.O.:240] Out: 700 [Urine:700]  General appearance: alert, cooperative and no distress Heart: irregularly irregular rhythm Lungs: clear to auscultation bilaterally Abdomen: soft, non-tender; bowel sounds normal; no masses,  no organomegaly Extremities: edema trace Wound: clean and dry  Lab Results:  Recent Labs  06/01/16 0412 06/02/16 0425  WBC 14.9* 14.2*  HGB 9.2* 7.8*  HCT 27.6* 23.8*  PLT 437* 429*   BMET:  Recent Labs  06/01/16 0412 06/02/16 0425  NA 132* 131*  K 4.5 3.8  CL 99* 99*  CO2 24 23  GLUCOSE 164* 57*  BUN 90* 93*  CREATININE 3.00* 3.08*  CALCIUM 8.5* 8.2*    PT/INR: No results for input(s): LABPROT, INR in the last 72 hours. ABG    Component Value Date/Time   PHART 7.425 05/28/2016 0512   HCO3 25.9 05/28/2016 0512   TCO2 28 05/29/2016 1703   ACIDBASEDEF 1.0 05/27/2016 1417   O2SAT 51.8 06/02/2016 0500   CBG (last 3)   Recent Labs  06/01/16 1631 06/01/16 2302 06/02/16 0724  GLUCAP 210* 144* 60*    Assessment/Plan: S/P Procedure(s)  (LRB): CORONARY ARTERY BYPASS GRAFTING (CABG) x3 using left internal mammary artery and left greater saphenous vein harvested endoscopically (N/A) TRANSESOPHAGEAL ECHOCARDIOGRAM (TEE) (N/A)  1. CV- A/C Systolic HF- A. Fib yesterday, brief conversion to NSR, currently back in A. Fib this morning, rate in the 110s, Co-ox 51%- on Milrinone per HF, continue Amiodarone drip for now, patient may ultimately need anti-coagulation 2. Pulm- wean oxygen as tolerated, continue IS 3. Renal- creatinine up to 3.08. On IV Lasix per HF, K down to 3.8, will supplement, repeat BMET in AM 4. Post operative blood loss anemia- Hgb down from 9.2-7.8, no acute source of blood loss, with persistent A.Fib may benefit from blood transfusion 5. DM- sugars under better control, Am sugar a little low this morning, will monitor but may need to scale back on Levemir 6. Dispo-patient back in A. Fib this morning, on IV Amiodarone, diuretics per HF will supplement potassium, Hgb down to 7.8 may benefit from blood transfusion, watch creatinine   LOS: 13 days    BARRETT, ERIN 06/02/2016  I have seen and examined the patient and agree with the assessment and plan as outlined.  Start warfarin due to recurrent atrial fibrillation.  Continue Plavix and stop ASA once therapeutic on warfarin.  Although weight remains up she doesn't look fluid overloaded on exam.  Agree w/ plans for transfusion per  Dr Haroldine Laws.  CBG low this morning.  Will stop HS sliding scale and decrease levemir   Rexene Alberts, MD 06/02/2016 10:36 AM

## 2016-06-02 NOTE — Progress Notes (Signed)
Advanced Heart Failure Rounding Note   Subjective:    Transferred from [facility] with volume overload despite IV diuresis + milrinone. Continues to diurese with IV lasix. CVP down to 11 Co-ox 65% on milrinone 0.56mcg/kg/min  Now s/p CABG x 3 on 05/27/16  Milrinone and IV lasix restarted 12/23. Feeling better. Diureses picking up but still about 15 pounds above baseline weight. Walking halls. Co-ox 52% on milrinone 0.25 but hgb 7.5  Had recurrent AF yesterday. Now back in NSR.   Creatinine 3.00 -> 2.76 -> 3.12-> 3.0 -> 3.1 .     Objective:   Weight Range:  Vital Signs:   Temp:  [97.8 F (36.6 C)-98.2 F (36.8 C)] 97.8 F (36.6 C) (12/24 0351) Pulse Rate:  [69-80] 80 (12/24 0351) Resp:  [18] 18 (12/24 0351) BP: (101-121)/(67-68) 114/68 (12/24 0351) SpO2:  [88 %-98 %] 96 % (12/24 0351) Weight:  [73.3 kg (161 lb 9.6 oz)] 73.3 kg (161 lb 9.6 oz) (12/24 0351) Last BM Date: 05/27/16 (pt reported before the surgery)  Weight change: Filed Weights   05/31/16 0500 06/01/16 0407 06/02/16 0351  Weight: 71.3 kg (157 lb 3 oz) 72.6 kg (160 lb) 73.3 kg (161 lb 9.6 oz)    Intake/Output:   Intake/Output Summary (Last 24 hours) at 06/02/16 1105 Last data filed at 06/02/16 0730  Gross per 24 hour  Intake              360 ml  Output              700 ml  Net             -340 ml     Physical Exam: General:  Lying in bed NAD HEENT: normal Neck: supple. JVP to jaw . Carotids 2+ bilat; no bruits. No lymphadenopathy or thryomegaly appreciated. Cor: sternal dressing RRR   + CTs Lungs: clear anteriorly Abdomen: soft, nontender, + mildly distended. hypoactivebowel sounds. Extremities: no cyanosis, clubbing, rash, trace edema Neuro: alert & orientedx3, cranial nerves grossly intact. moves all 4 extremities w/o difficulty. Affect pleasant  Telemetry: NSR 70s   Labs: Basic Metabolic Panel:  Recent Labs Lab 05/27/16 2000  05/28/16 0420 05/28/16 1800  05/29/16 0400 05/29/16 1703  05/30/16 0500 05/31/16 0500 06/01/16 0412 06/02/16 0425  NA  --   < > 139  --   < > 138 134* 136 131* 132* 131*  K  --   < > 4.3  --   < > 4.4 4.5 4.3 5.2* 4.5 3.8  CL  --   < > 104  --   < > 102 98* 102 98* 99* 99*  CO2  --   --  27  --   --  27  --  27 23 24 23   GLUCOSE  --   < > 134*  --   < > 156* 201* 83 333* 164* 57*  BUN  --   < > 43*  --   < > 54* 67* 65* 79* 90* 93*  CREATININE 1.61*  < > 1.71* 2.36*  < > 2.84* 3.00* 2.76* 3.12* 3.00* 3.08*  CALCIUM  --   --  8.4*  --   --  8.7*  --  8.6* 8.3* 8.5* 8.2*  MG 3.0*  --  2.8* 2.8*  --   --   --   --   --   --   --   < > = values in this interval not displayed.  Liver Function Tests: No results for input(s): AST, ALT, ALKPHOS, BILITOT, PROT, ALBUMIN in the last 168 hours. No results for input(s): LIPASE, AMYLASE in the last 168 hours. No results for input(s): AMMONIA in the last 168 hours.  CBC:  Recent Labs Lab 05/29/16 0400 05/29/16 1703 05/30/16 0500 05/31/16 0500 06/01/16 0412 06/02/16 0425  WBC 18.1*  --  16.6* 13.4* 14.9* 14.2*  HGB 9.1* 8.5* 9.0* 8.7* 9.2* 7.8*  HCT 28.2* 25.0* 28.0* 27.1* 27.6* 23.8*  MCV 83.4  --  83.6 84.7 80.9 81.0  PLT 337  --  346 344 437* 429*    Cardiac Enzymes: No results for input(s): CKTOTAL, CKMB, CKMBINDEX, TROPONINI in the last 168 hours.  BNP: BNP (last 3 results)  Recent Labs  06/19/15 0923 05/13/16 0905  BNP 1,782.0* 2,790.0*    ProBNP (last 3 results) No results for input(s): PROBNP in the last 8760 hours.    Other results:  Imaging: Dg Chest Port 1 View  Result Date: 06/01/2016 CLINICAL DATA:  History of CABG. EXAM: PORTABLE CHEST 1 VIEW COMPARISON:  05/31/2016 FINDINGS: Right-sided PICC line right simple central venous catheters are unchanged. Sternotomy wires unchanged. Lungs are adequately inflated with persistent left base opacification likely effusion with atelectasis. Minimal right base opacification likely small effusion with atelectasis. Stable  cardiomegaly. Remainder of the exam is unchanged. IMPRESSION: Stable mild right base and moderate left base opacification likely effusions with atelectasis. Stable cardiomegaly. Tubes and lines unchanged. Electronically Signed   By: Marin Olp M.D.   On: 06/01/2016 09:48     Medications:     Scheduled Medications: . sodium chloride   Intravenous Once  . amiodarone  400 mg Oral BID  . aspirin EC  81 mg Oral Daily  . atorvastatin  40 mg Oral q1800  . bisacodyl  10 mg Oral Daily   Or  . bisacodyl  10 mg Rectal Daily  . clopidogrel  75 mg Oral Daily  . docusate sodium  200 mg Oral Daily  . enoxaparin (LOVENOX) injection  30 mg Subcutaneous Q24H  . furosemide  80 mg Intravenous BID  . guaiFENesin  600 mg Oral BID  . insulin aspart  0-15 Units Subcutaneous TID WC  . [START ON 06/03/2016] insulin detemir  15 Units Subcutaneous QHS  . lactulose  20 g Oral Once  . metoCLOPramide (REGLAN) injection  10 mg Intravenous Q6H  . metolazone  2.5 mg Oral Once  . pantoprazole  40 mg Oral Daily  . potassium chloride  40 mEq Oral Daily  . warfarin  2.5 mg Oral q1800  . Warfarin - Pharmacist Dosing Inpatient   Does not apply q1800    Infusions: . amiodarone 30 mg/hr (06/02/16 0659)  . milrinone 0.25 mcg/kg/min (06/02/16 1033)    PRN Medications: ondansetron (ZOFRAN) IV, oxyCODONE, promethazine, traMADol   Assessment:   1. A/C Systolic Heart Failure  2. NSTEMI with CAD-severe 2 vessel disease LAD RCA    --s/pg CABG 12/18 with LIMA-LAD, SVG-RCA and SVG-diagonal 3. AKI on CKD 4.Uncontrolled DM- Hgb AC 11.3  5. Hyperkalemia  6. Elevated TSH 7. COPD  8. PAF on 12/20 and 12/21 - resolved with IV amio   Plan/Discussion:    Volume status starting to improve. Continue milrinone and IV lasix.   Transfuse 1u RBCs.   No ACE/ARB with AKI. No b-blocker with low output. Continue ASA and statin and plavix (previous stent)   Back in NSR after IV amio. Given several recurrences will start  coumadin.  Once INR >= 2.0 can stop ASA and use Plavix/warfarin. D/w Dr. Roxy Manns.   Ambulate.   Kruz Chiu,MD 11:05 AM   Length of Stay: 13  Shamir Tuzzolino MD 06/02/2016, 11:05 AM  Advanced Heart Failure Team Pager [phone] (M-F; [place])  Please contact [facility] for night-coverage after hours (4p -7a ) and weekends on [email or web address]

## 2016-06-03 ENCOUNTER — Inpatient Hospital Stay (HOSPITAL_COMMUNITY): Payer: Medicaid Other

## 2016-06-03 DIAGNOSIS — N179 Acute kidney failure, unspecified: Secondary | ICD-10-CM

## 2016-06-03 LAB — BASIC METABOLIC PANEL
Anion gap: 11 (ref 5–15)
BUN: 97 mg/dL — ABNORMAL HIGH (ref 6–20)
CO2: 22 mmol/L (ref 22–32)
Calcium: 8 mg/dL — ABNORMAL LOW (ref 8.9–10.3)
Chloride: 95 mmol/L — ABNORMAL LOW (ref 101–111)
Creatinine, Ser: 3.81 mg/dL — ABNORMAL HIGH (ref 0.44–1.00)
GFR calc Af Amer: 13 mL/min — ABNORMAL LOW (ref >60)
GFR calc non Af Amer: 12 mL/min — ABNORMAL LOW (ref >60)
Glucose, Bld: 313 mg/dL — ABNORMAL HIGH (ref 65–99)
Potassium: 5.2 mmol/L — ABNORMAL HIGH (ref 3.5–5.1)
Sodium: 128 mmol/L — ABNORMAL LOW (ref 135–145)

## 2016-06-03 LAB — GLUCOSE, CAPILLARY
GLUCOSE-CAPILLARY: 223 mg/dL — AB (ref 65–99)
Glucose-Capillary: 313 mg/dL — ABNORMAL HIGH (ref 65–99)
Glucose-Capillary: 349 mg/dL — ABNORMAL HIGH (ref 65–99)
Glucose-Capillary: 266 mg/dL — ABNORMAL HIGH (ref 65–99)

## 2016-06-03 LAB — CBC
HCT: 26.2 % — ABNORMAL LOW (ref 36.0–46.0)
Hemoglobin: 8.7 g/dL — ABNORMAL LOW (ref 12.0–15.0)
MCH: 26.4 pg (ref 26.0–34.0)
MCHC: 33.2 g/dL (ref 30.0–36.0)
MCV: 79.6 fL (ref 78.0–100.0)
Platelets: 454 10*3/uL — ABNORMAL HIGH (ref 150–400)
RBC: 3.29 MIL/uL — ABNORMAL LOW (ref 3.87–5.11)
RDW: 15.9 % — ABNORMAL HIGH (ref 11.5–15.5)
WBC: 12.2 10*3/uL — ABNORMAL HIGH (ref 4.0–10.5)

## 2016-06-03 LAB — COOXEMETRY PANEL
Carboxyhemoglobin: 0.9 % (ref 0.5–1.5)
Methemoglobin: 1.1 % (ref 0.0–1.5)
O2 Saturation: 57.6 %
Total hemoglobin: 12 g/dL (ref 12.0–16.0)

## 2016-06-03 LAB — TYPE AND SCREEN
BLOOD PRODUCT EXPIRATION DATE: 201801052359
ISSUE DATE / TIME: 201712241358
UNIT TYPE AND RH: 5100

## 2016-06-03 LAB — PROTIME-INR
INR: 1.13
PROTHROMBIN TIME: 14.5 s (ref 11.4–15.2)

## 2016-06-03 MED ORDER — WARFARIN - PHYSICIAN DOSING INPATIENT
Freq: Every day | Status: DC
  Administered 2016-06-06 – 2016-06-07 (×2)

## 2016-06-03 MED ORDER — GUAIFENESIN-DM 100-10 MG/5ML PO SYRP
15.0000 mL | ORAL_SOLUTION | Freq: Four times a day (QID) | ORAL | Status: DC | PRN
  Administered 2016-06-03: 15 mL via ORAL
  Filled 2016-06-03: qty 15

## 2016-06-03 NOTE — Progress Notes (Addendum)
Advanced Heart Failure Rounding Note   Subjective:    Transferred from [facility] with volume overload despite IV diuresis + milrinone. Continues to diurese with IV lasix. CVP down to 11 Co-ox 65% on milrinone 0.68mcg/kg/min  Now s/p CABG x 3 on 05/27/16  Milrinone and IV lasix restarted 12/23. Urine output remains poor despite IV lasix. Creatinine up to 3.8. Weight continues to go up - now 20 pounds over pre-op weight. Feels swollen. Mildly confused. Refusing Reglan due to nausea. Co-ox 58% on milrinone.   Remains on IV. In NSR. Warfarin started 12/24  Creatinine 3.00 -> 2.76 -> 3.12-> 3.0 -> 3.1-> 3.8 .     Objective:   Weight Range:  Vital Signs:   Temp:  [97.7 F (36.5 C)-98.1 F (36.7 C)] 97.7 F (36.5 C) (12/25 0501) Pulse Rate:  [65-118] 65 (12/25 0501) Resp:  [16-18] 18 (12/25 0501) BP: (111-149)/(64-88) 149/72 (12/25 0501) SpO2:  [91 %-93 %] 92 % (12/25 0501) Weight:  [74 kg (163 lb 1.6 oz)] 74 kg (163 lb 1.6 oz) (12/25 0501) Last BM Date: 05/27/16  Weight change: Filed Weights   06/01/16 0407 06/02/16 0351 06/03/16 0501  Weight: 72.6 kg (160 lb) 73.3 kg (161 lb 9.6 oz) 74 kg (163 lb 1.6 oz)    Intake/Output:   Intake/Output Summary (Last 24 hours) at 06/03/16 0755 Last data filed at 06/03/16 0459  Gross per 24 hour  Intake              335 ml  Output              675 ml  Net             -340 ml     Physical Exam: General:  Lying in bed NAD HEENT: normal Neck: supple. JVP to jaw . Carotids 2+ bilat; no bruits. No lymphadenopathy or thryomegaly appreciated. Cor: sternal dressing RRR Lungs: clear anteriorly Abdomen: soft, nontender, Extremities: no cyanosis, clubbing, rash, 2+ edema Neuro: alert & orientedx3, cranial nerves grossly intact. moves all 4 extremities w/o difficulty. Affect pleasant  Telemetry: NSR 60-70s   Labs: Basic Metabolic Panel:  Recent Labs Lab 05/27/16 2000  05/28/16 0420 05/28/16 1800  05/30/16 0500 05/31/16 0500  06/01/16 0412 06/02/16 0425 06/03/16 0408  NA  --   < > 139  --   < > 136 131* 132* 131* 128*  K  --   < > 4.3  --   < > 4.3 5.2* 4.5 3.8 5.2*  CL  --   < > 104  --   < > 102 98* 99* 99* 95*  CO2  --   --  27  --   < > 27 23 24 23 22   GLUCOSE  --   < > 134*  --   < > 83 333* 164* 57* 313*  BUN  --   < > 43*  --   < > 65* 79* 90* 93* 97*  CREATININE 1.61*  < > 1.71* 2.36*  < > 2.76* 3.12* 3.00* 3.08* 3.81*  CALCIUM  --   --  8.4*  --   < > 8.6* 8.3* 8.5* 8.2* 8.0*  MG 3.0*  --  2.8* 2.8*  --   --   --   --   --   --   < > = values in this interval not displayed.  Liver Function Tests: No results for input(s): AST, ALT, ALKPHOS, BILITOT, PROT, ALBUMIN in the last 168  hours. No results for input(s): LIPASE, AMYLASE in the last 168 hours. No results for input(s): AMMONIA in the last 168 hours.  CBC:  Recent Labs Lab 05/30/16 0500 05/31/16 0500 06/01/16 0412 06/02/16 0425 06/03/16 0408  WBC 16.6* 13.4* 14.9* 14.2* 12.2*  HGB 9.0* 8.7* 9.2* 7.8* 8.7*  HCT 28.0* 27.1* 27.6* 23.8* 26.2*  MCV 83.6 84.7 80.9 81.0 79.6  PLT 346 344 437* 429* 454*    Cardiac Enzymes: No results for input(s): CKTOTAL, CKMB, CKMBINDEX, TROPONINI in the last 168 hours.  BNP: BNP (last 3 results)  Recent Labs  06/19/15 0923 05/13/16 0905  BNP 1,782.0* 2,790.0*    ProBNP (last 3 results) No results for input(s): PROBNP in the last 8760 hours.    Other results:  Imaging: No results found.   Medications:     Scheduled Medications: . amiodarone  400 mg Oral BID  . aspirin EC  81 mg Oral Daily  . atorvastatin  40 mg Oral q1800  . bisacodyl  10 mg Oral Daily   Or  . bisacodyl  10 mg Rectal Daily  . clopidogrel  75 mg Oral Daily  . docusate sodium  200 mg Oral Daily  . enoxaparin (LOVENOX) injection  30 mg Subcutaneous Q24H  . furosemide  80 mg Intravenous BID  . guaiFENesin  600 mg Oral BID  . insulin aspart  0-15 Units Subcutaneous TID WC  . insulin detemir  15 Units  Subcutaneous QHS  . lactulose  20 g Oral Once  . metoCLOPramide (REGLAN) injection  10 mg Intravenous Q6H  . pantoprazole  40 mg Oral Daily  . potassium chloride  40 mEq Oral Daily  . warfarin  2.5 mg Oral q1800  . Warfarin - Pharmacist Dosing Inpatient   Does not apply q1800    Infusions: . amiodarone 30 mg/hr (06/03/16 0236)  . milrinone 0.25 mcg/kg/min (06/03/16 0521)    PRN Medications: guaiFENesin-dextromethorphan, ondansetron (ZOFRAN) IV, oxyCODONE, promethazine, sodium chloride flush, traMADol   Assessment:   1. A/C Systolic Heart Failure  2. NSTEMI with CAD-severe 2 vessel disease LAD RCA    --s/pg CABG 12/18 with LIMA-LAD, SVG-RCA and SVG-diagonal 3. AKI on CKD 4.Uncontrolled DM- Hgb AC 11.3  5. Hyperkalemia  6. Elevated TSH 7. COPD  8. PAF on 12/20 and 12/21 - resolved with IV amio   Plan/Discussion:    Volume status continues to worsen. Weight up 20 pounds. Creatinine up.  I think she is starting to have some uremic symptoms. Likely need Renal to see soon. Hold diuretics to give kidneys chance to recover. Will get renal u/s. Will not treat BP aggressively to maintain renal perfusion.  Co-ox ok. Will continue milrinone.   No ACE/ARB with AKI. No b-blocker with low output. Continue ASA and statin and plavix (previous stent)   Back in NSR after IV amio. Given several recurrences coumadin started 12/24. Once INR >= 2.0 can stop ASA and use Plavix/warfarin. D/w Dr. Roxy Manns.   Ambulate.   Careem Yasui,MD 7:55 AM   Length of Stay: 14  Tayla Panozzo MD 06/03/2016, 7:55 AM  Advanced Heart Failure Team Pager [phone] (M-F; [place])  Please contact [facility] for night-coverage after hours (4p -7a ) and weekends on [email or web address]

## 2016-06-03 NOTE — Progress Notes (Signed)
      [address]       [place] [address]             [phone]     CARDIOTHORACIC SURGERY PROGRESS NOTE  7 Days Post-Op  S/P Procedure(s) (LRB): CORONARY ARTERY BYPASS GRAFTING (CABG) x3 using left internal mammary artery and left greater saphenous vein harvested endoscopically (N/A) TRANSESOPHAGEAL ECHOCARDIOGRAM (TEE) (N/A)  Subjective: Looks okay and reports feeling a little better.  Denies SOB.  Minimal pain.  Still some LE edema  Objective: Vital signs in last 24 hours: Temp:  [97.7 F (36.5 C)-98.1 F (36.7 C)] 97.7 F (36.5 C) (12/25 0501) Pulse Rate:  [65-118] 65 (12/25 0501) Cardiac Rhythm: Normal sinus rhythm;Bundle branch block (12/24 1900) Resp:  [16-18] 18 (12/25 0501) BP: (111-149)/(64-88) 149/72 (12/25 0501) SpO2:  [91 %-93 %] 92 % (12/25 0501) Weight:  [163 lb 1.6 oz (74 kg)] 163 lb 1.6 oz (74 kg) (12/25 0501)  Physical Exam:  Rhythm:   sinus  Breath sounds: Diminished at bases  Heart sounds:  RRR  Incisions:  Clean and dry  Abdomen:  Soft, non-distended, non-tender  Extremities:  Warm, well-perfused, + LE edema   Intake/Output from previous day: 12/24 0701 - 12/25 0700 In: 575 [P.O.:240; Blood:335] Out: 675 [Urine:675] Intake/Output this shift: No intake/output data recorded.  Lab Results:  Recent Labs  06/02/16 0425 06/03/16 0408  WBC 14.2* 12.2*  HGB 7.8* 8.7*  HCT 23.8* 26.2*  PLT 429* 454*   BMET:  Recent Labs  06/02/16 0425 06/03/16 0408  NA 131* 128*  K 3.8 5.2*  CL 99* 95*  CO2 23 22  GLUCOSE 57* 313*  BUN 93* 97*  CREATININE 3.08* 3.81*  CALCIUM 8.2* 8.0*    CBG (last 3)   Recent Labs  06/02/16 1618 06/02/16 2319 06/03/16 0559  GLUCAP 172* 237* 313*   PT/INR:   Recent Labs  06/03/16 0408  LABPROT 14.5  INR 1.13    CXR:  N/A  Assessment/Plan: S/P Procedure(s) (LRB): CORONARY ARTERY BYPASS GRAFTING (CABG) x3 using left internal mammary artery and left greater saphenous vein harvested  endoscopically (N/A) TRANSESOPHAGEAL ECHOCARDIOGRAM (TEE) (N/A)  Overall stable and now maintaining NSR w/ stable BP, co-ox improved after transfusion Renal function continues to deteriorate w/ rising BUN/Cr and poor response to diuretics Anemia improved after transfusion Maintaining NSR on IV amiodarone   Agree w/ plans outlined by Dr Haroldine Laws  Hold diuretics today and watch renal function  Consider nephrology consult if not improved soon  Mobilize  Continue amiodarone IV for now  Coumadin, ASA and Plavix - D/C ASA once therapeutic on Coumadin   Rexene Alberts, MD 06/03/2016 9:00 AM

## 2016-06-04 ENCOUNTER — Encounter (HOSPITAL_COMMUNITY): Payer: Self-pay | Admitting: Interventional Radiology

## 2016-06-04 ENCOUNTER — Inpatient Hospital Stay (HOSPITAL_COMMUNITY): Payer: Medicaid Other

## 2016-06-04 HISTORY — PX: IR GENERIC HISTORICAL: IMG1180011

## 2016-06-04 LAB — CBC
HCT: 28.1 % — ABNORMAL LOW (ref 36.0–46.0)
HEMOGLOBIN: 9.5 g/dL — AB (ref 12.0–15.0)
MCH: 26.8 pg (ref 26.0–34.0)
MCHC: 33.8 g/dL (ref 30.0–36.0)
MCV: 79.2 fL (ref 78.0–100.0)
PLATELETS: 532 10*3/uL — AB (ref 150–400)
RBC: 3.55 MIL/uL — ABNORMAL LOW (ref 3.87–5.11)
RDW: 15.9 % — ABNORMAL HIGH (ref 11.5–15.5)
WBC: 11.6 10*3/uL — ABNORMAL HIGH (ref 4.0–10.5)

## 2016-06-04 LAB — COMPREHENSIVE METABOLIC PANEL
ALBUMIN: 2.6 g/dL — AB (ref 3.5–5.0)
ALK PHOS: 373 U/L — AB (ref 38–126)
ALT: 36 U/L (ref 14–54)
ANION GAP: 12 (ref 5–15)
AST: 31 U/L (ref 15–41)
BUN: 111 mg/dL — ABNORMAL HIGH (ref 6–20)
CALCIUM: 8.3 mg/dL — AB (ref 8.9–10.3)
CHLORIDE: 94 mmol/L — AB (ref 101–111)
CO2: 22 mmol/L (ref 22–32)
Creatinine, Ser: 4.68 mg/dL — ABNORMAL HIGH (ref 0.44–1.00)
GFR calc non Af Amer: 9 mL/min — ABNORMAL LOW (ref >60)
GFR, EST AFRICAN AMERICAN: 10 mL/min — AB (ref >60)
GLUCOSE: 124 mg/dL — AB (ref 65–99)
POTASSIUM: 4.9 mmol/L (ref 3.5–5.1)
SODIUM: 128 mmol/L — AB (ref 135–145)
Total Bilirubin: 0.5 mg/dL (ref 0.3–1.2)
Total Protein: 5.9 g/dL — ABNORMAL LOW (ref 6.5–8.1)

## 2016-06-04 LAB — GLUCOSE, CAPILLARY
GLUCOSE-CAPILLARY: 213 mg/dL — AB (ref 65–99)
GLUCOSE-CAPILLARY: 163 mg/dL — AB (ref 65–99)
GLUCOSE-CAPILLARY: 166 mg/dL — AB (ref 65–99)
Glucose-Capillary: 146 mg/dL — ABNORMAL HIGH (ref 65–99)
Glucose-Capillary: 186 mg/dL — ABNORMAL HIGH (ref 65–99)

## 2016-06-04 LAB — PROTIME-INR
INR: 1.43
PROTHROMBIN TIME: 17.6 s — AB (ref 11.4–15.2)

## 2016-06-04 LAB — BRAIN NATRIURETIC PEPTIDE: B NATRIURETIC PEPTIDE 5: 3366.3 pg/mL — AB (ref 0.0–100.0)

## 2016-06-04 LAB — PREALBUMIN: Prealbumin: 17.4 mg/dL — ABNORMAL LOW (ref 18–38)

## 2016-06-04 MED ORDER — ALTEPLASE 2 MG IJ SOLR
2.0000 mg | Freq: Once | INTRAMUSCULAR | Status: AC
  Administered 2016-06-04: 2 mg

## 2016-06-04 MED ORDER — LIDOCAINE HCL (PF) 1 % IJ SOLN
INTRAMUSCULAR | Status: DC | PRN
  Administered 2016-06-04: 10 mL via INTRADERMAL

## 2016-06-04 MED ORDER — LIDOCAINE HCL (PF) 1 % IJ SOLN
INTRAMUSCULAR | Status: AC
  Filled 2016-06-04: qty 30

## 2016-06-04 MED ORDER — HEPARIN SODIUM (PORCINE) 1000 UNIT/ML IJ SOLN
INTRAMUSCULAR | Status: AC
  Administered 2016-06-04: 2800 [IU]
  Filled 2016-06-04: qty 1

## 2016-06-04 MED ORDER — ALTEPLASE 2 MG IJ SOLR: 2.0000 mg | Freq: Once | INTRAMUSCULAR | Status: DC

## 2016-06-04 MED ORDER — FUROSEMIDE 10 MG/ML IJ SOLN
160.0000 mg | Freq: Three times a day (TID) | INTRAMUSCULAR | Status: DC
Start: 2016-06-04 — End: 2016-06-11
  Administered 2016-06-04 – 2016-06-11 (×20): 160 mg via INTRAVENOUS
  Filled 2016-06-04 (×22): qty 16

## 2016-06-04 NOTE — Progress Notes (Signed)
Advanced Heart Failure Rounding Note   Subjective:    Transferred from [facility] with volume overload despite IV diuresis + milrinone. Continues to diurese with IV lasix. CVP down to 11 Co-ox 65% on milrinone 0.46mcg/kg/min  Now s/p CABG x 3 on 05/27/16  Milrinone and IV lasix restarted 12/23.   Urine output minimal. Diuretics held yesterday with worsening renal functional.  Creatinine now up to 4.6. Weight > 20 lbs over pre-op weight.   Coox pending on milrinone 0.25 mcg/kg/min.   Denies CP or SOB. Not peeing very much.   Remains on IV. In NSR. Warfarin started 12/24  Creatinine 3.00 -> 2.76 -> 3.12-> 3.0 -> 3.1-> 3.8 -> 4.6    Objective:   Weight Range:  Vital Signs:   Temp:  [97.4 F (36.3 C)-97.8 F (36.6 C)] 97.7 F (36.5 C) (12/26 0551) Pulse Rate:  [63-68] 63 (12/26 0551) Resp:  [18] 18 (12/26 0551) BP: (122-153)/(55-77) 122/55 (12/26 0551) SpO2:  [91 %-95 %] 91 % (12/26 0551) Weight:  [166 lb 11.2 oz (75.6 kg)] 166 lb 11.2 oz (75.6 kg) (12/26 0551) Last BM Date: 06/03/16  Weight change: Filed Weights   06/02/16 0351 06/03/16 0501 06/04/16 0551  Weight: 161 lb 9.6 oz (73.3 kg) 163 lb 1.6 oz (74 kg) 166 lb 11.2 oz (75.6 kg)    Intake/Output:   Intake/Output Summary (Last 24 hours) at 06/04/16 1203 Last data filed at 06/04/16 0200  Gross per 24 hour  Intake              240 ml  Output              200 ml  Net               40 ml     Physical Exam: General:  Lying in bed. NAD HEENT: Normal Neck: supple. JVP remains elevated to jaw. Carotids 2+ bilat; no bruits. No thyromegaly or nodule noted.  Cor: sternal dressing RRR Lungs: Diminished basilar sounds.  Abdomen: soft, NT, ND, no HSM. No bruits or masses. +BS  Extremities: no cyanosis, clubbing, rash, 2-3+ edema up into thighs.  Neuro: alert & orientedx3, cranial nerves grossly intact. moves all 4 extremities w/o difficulty. Affect pleasant  Telemetry: Reviewed, NSR 60-70s    Labs: Basic  Metabolic Panel:  Recent Labs Lab 05/28/16 1800  05/31/16 0500 06/01/16 0412 06/02/16 0425 06/03/16 0408 06/04/16 0834  NA  --   < > 131* 132* 131* 128* 128*  K  --   < > 5.2* 4.5 3.8 5.2* 4.9  CL  --   < > 98* 99* 99* 95* 94*  CO2  --   < > 23 24 23 22 22   GLUCOSE  --   < > 333* 164* 57* 313* 124*  BUN  --   < > 79* 90* 93* 97* 111*  CREATININE 2.36*  < > 3.12* 3.00* 3.08* 3.81* 4.68*  CALCIUM  --   < > 8.3* 8.5* 8.2* 8.0* 8.3*  MG 2.8*  --   --   --   --   --   --   < > = values in this interval not displayed.  Liver Function Tests:  Recent Labs Lab 06/04/16 0834  AST 31  ALT 36  ALKPHOS 373*  BILITOT 0.5  PROT 5.9*  ALBUMIN 2.6*   No results for input(s): LIPASE, AMYLASE in the last 168 hours. No results for input(s): AMMONIA in the last 168 hours.  CBC:  Recent Labs Lab 05/31/16 0500 06/01/16 0412 06/02/16 0425 06/03/16 0408 06/04/16 0834  WBC 13.4* 14.9* 14.2* 12.2* 11.6*  HGB 8.7* 9.2* 7.8* 8.7* 9.5*  HCT 27.1* 27.6* 23.8* 26.2* 28.1*  MCV 84.7 80.9 81.0 79.6 79.2  PLT 344 437* 429* 454* 532*    Cardiac Enzymes: No results for input(s): CKTOTAL, CKMB, CKMBINDEX, TROPONINI in the last 168 hours.  BNP: BNP (last 3 results)  Recent Labs  06/19/15 0923 05/13/16 0905 06/04/16 0834  BNP 1,782.0* 2,790.0* 3,366.3*    ProBNP (last 3 results) No results for input(s): PROBNP in the last 8760 hours.    Other results:  Imaging: Dg Chest 2 View  Result Date: 06/04/2016 CLINICAL DATA:  Atelectasis. EXAM: CHEST  2 VIEW COMPARISON:  06/01/2016. FINDINGS: Right PICC line in stable position. Prior CABG. Stable cardiomegaly. Partial clearing of bilateral infiltrates. Low lung volumes with basilar atelectasis. No pneumothorax. IMPRESSION: 1.  Right PICC line in stable position. 2. Prior CABG.  Stable cardiomegaly. 3. Partial clearing of bilateral infiltrates. Low lung volumes with mild basilar atelectasis. Electronically Signed   By: Marcello Moores  Register    On: 06/04/2016 07:35   US Renal  Result Date: 06/03/2016 CLINICAL DATA:  Acute kidney injury EXAM: RENAL / URINARY TRACT ULTRASOUND COMPLETE COMPARISON:  07/04/2015 FINDINGS: Right Kidney: Length: 10.8 cm. Echogenic renal parenchyma. No mass or hydronephrosis. Left Kidney: Length: 10.3 cm. Echogenic renal parenchyma. 4 mm lower pole calculus. No hydronephrosis. Bladder: Poorly visualized/ underdistended. Additional comments: Large right pleural effusion. Mild perihepatic ascites. Layering gallbladder sludge. IMPRESSION: 4 mm left lower pole renal calculus.  No hydronephrosis. Echogenic renal parenchyma bilaterally, suggesting medical renal disease. Electronically Signed   By: Julian Hy M.D.   On: 06/03/2016 16:30     Medications:     Scheduled Medications: . alteplase  2 mg Intracatheter Once  . aspirin EC  81 mg Oral Daily  . atorvastatin  40 mg Oral q1800  . bisacodyl  10 mg Oral Daily   Or  . bisacodyl  10 mg Rectal Daily  . clopidogrel  75 mg Oral Daily  . docusate sodium  200 mg Oral Daily  . enoxaparin (LOVENOX) injection  30 mg Subcutaneous Q24H  . guaiFENesin  600 mg Oral BID  . insulin aspart  0-15 Units Subcutaneous TID WC  . insulin detemir  15 Units Subcutaneous QHS  . lactulose  20 g Oral Once  . metoCLOPramide (REGLAN) injection  10 mg Intravenous Q6H  . pantoprazole  40 mg Oral Daily  . warfarin  2.5 mg Oral q1800  . Warfarin - Physician Dosing Inpatient   Does not apply q1800    Infusions: . amiodarone 30 mg/hr (06/04/16 0129)  . milrinone 0.25 mcg/kg/min (06/04/16 0038)    PRN Medications: guaiFENesin-dextromethorphan, ondansetron (ZOFRAN) IV, oxyCODONE, promethazine, sodium chloride flush, traMADol   Assessment:   1. A/C Systolic Heart Failure  2. NSTEMI with CAD-severe 2 vessel disease LAD RCA    --s/pg CABG 12/18 with LIMA-LAD, SVG-RCA and SVG-diagonal 3. AKI on CKD 4.Uncontrolled DM- Hgb AC 11.3  5. Hyperkalemia  6. Elevated TSH 7. COPD    8. PAF on 12/20 and 12/21 - resolved with IV amio   Plan/Discussion:    Volume status continues to trend up. Creatinine 4.6 this am and BUN > 100. Renal US without hydronephrosis 06/03/16.  Renal has been consulted.  IR to place Trialysis cath.   If needs tunneled cath, will have to hold Plavix for 5 days,  so will hold for now.    Co-ox pending on milrinone.    No ACE/ARB with AKI. No b-blocker with low output. Continue ASA and statin. OK to stop plavix as stent was in 06/2015.    Remains in NSR after IV amio. Given several recurrences coumadin started 12/24. INR 1.43 this am. Once INR > 2.0 can stop ASA and use warfarin alone.    Length of Stay: [place], [place]  06/04/2016, 12:03 PM  Advanced Heart Failure Team Pager [phone] (M-F; 7a - 4p)  Please contact [facility] for night-coverage after hours (4p -7a ) and weekends on [email or web address]  Patient seen and examined with Oda Kilts, PA-C. We discussed all aspects of the encounter. I agree with the assessment and plan as stated above.   Renal function continues to worsen. She is very volume overloaded. Renal u/s with cortical thinning. Seen by Renal and will max out lasix today. If no response will start CVVHD tomorrow. Trialysis cath has been placed by IR. Long discussion about timeline for watching for renal recovery vs. Potential need long term HD (hopefully can avoid). Continue milrinone for now.   Kaleea Penner,MD 4:47 PM

## 2016-06-04 NOTE — Progress Notes (Addendum)
      [address]       [place] [address]             [phone]        8 Days Post-Op Procedure(s) (LRB): CORONARY ARTERY BYPASS GRAFTING (CABG) x3 using left internal mammary artery and left greater saphenous vein harvested endoscopically (N/A) TRANSESOPHAGEAL ECHOCARDIOGRAM (TEE) (N/A)  Subjective: Patient tired this am.  Objective: Vital signs in last 24 hours: Temp:  [97.4 F (36.3 C)-97.8 F (36.6 C)] 97.7 F (36.5 C) (12/26 0551) Pulse Rate:  [63-68] 63 (12/26 0551) Cardiac Rhythm: Normal sinus rhythm (12/25 1900) Resp:  [18] 18 (12/26 0551) BP: (122-153)/(55-77) 122/55 (12/26 0551) SpO2:  [91 %-95 %] 91 % (12/26 0551) Weight:  [166 lb 11.2 oz (75.6 kg)] 166 lb 11.2 oz (75.6 kg) (12/26 0551)  Pre op weight 65 kg Current Weight  06/04/16 166 lb 11.2 oz (75.6 kg)      Intake/Output from previous day: 12/25 0701 - 12/26 0700 In: 240 [P.O.:240] Out: 200 [Urine:200]   Physical Exam:  Cardiovascular: RRR Pulmonary: Somewhat coarse this am. Abdomen: Soft, non tender, bowel sounds present. Extremities: Mild bilateral lower extremity edema. Wounds: Clean and dry.  No erythema or signs of infection.  Lab Results: CBC: Recent Labs  06/02/16 0425 06/03/16 0408  WBC 14.2* 12.2*  HGB 7.8* 8.7*  HCT 23.8* 26.2*  PLT 429* 454*   BMET:  Recent Labs  06/02/16 0425 06/03/16 0408  NA 131* 128*  K 3.8 5.2*  CL 99* 95*  CO2 23 22  GLUCOSE 57* 313*  BUN 93* 97*  CREATININE 3.08* 3.81*  CALCIUM 8.2* 8.0*    PT/INR:  Lab Results  Component Value Date   INR 1.13 06/03/2016   INR 1.31 05/27/2016   INR 0.99 05/26/2016   ABG:  INR: Will add last result for INR, ABG once components are confirmed Will add last 4 CBG results once components are confirmed  Assessment/Plan:  1. CV - Had a fib yesterday. SR in the 60's. On Amiodarone and Milrinone drips. Also, on Plavix 75 mg daily and Coumadin. No INR result for today yet (PICC line  needs TPA so labs not drawn yet). Hope to stop Amiodarone drip soon. Await co ox to help determine titration of Milrinone drip. Heart failure following. 2.  Pulmonary - On room air. Encourage incentive spirometer. 3.  Acute blood loss anemia - Last H and H stable at 8.7 and 26.2. Had transfusion. 4. DM-CBGs 223/213/163. On Pre op HGA1C 11.3. Will need close medical follow up after discharge. 5. Acute on chronic renal failure-Creatinine up to 3.81 yesterday. Renal US yesterday showed 4 mm left renal calculus but no hydronephrosis. Also showed echogenic renal parenchyma bilaterally, suggesting medical renal disease. May need renal consult depending on creatinine result today.  ZIMMERMAN,DONIELLE MPA-C 06/04/2016,7:15 AM  Patient seen and examined, agree with above Her CBG were markedly elevated yesterday, lantus was held on evening of 12/24. She did receive lantus last night so we will follow for now Labs pending due to inability to draw from PICC line  Remo Lipps C. Roxan Hockey, MD [facility] [phone]

## 2016-06-04 NOTE — Progress Notes (Signed)
  Request seen for temporary hemodialysis catheter placement.  Patient on Plavix and Coumadin.   If renal function does not recover and tunneled catheter is needed, patient will need to be off Plavix x 5 days and off coumadin x 3 with INR = or < 1.5.  Discussed with Oda Kilts with heart failure clinic and he is aware.  Robert Sperl S Ameliya Nicotra PA-C 06/04/2016 10:29 AM

## 2016-06-04 NOTE — Procedures (Signed)
Interventional Radiology Procedure Note  Procedure: Placement of a right IJ approach temporary HD catheter.  Tip is positioned at the superior cavoatrial junction and catheter is ready for immediate use. OK for conversion if tunneled HD is needed.  Complications: None Recommendations:  - Ok to use - Do not submerge  - Routine line care   Signed,  Dulcy Fanny. Earleen Newport, DO

## 2016-06-04 NOTE — Progress Notes (Signed)
CARDIAC REHAB PHASE I   PRE:  Rate/Rhythm: 64 SR  BP:  Supine:   Sitting: 130/78  Standing:    SaO2: 94%RA  MODE:  Ambulation: 150 ft   POST:  Rate/Rhythm: 64 SR  BP:  Supine:   Sitting: 112/57  Standing:    SaO2: 91%RA 0905-0930 Pt stated legs felt heavy and swollen today. Walked 150 ft on RA with rolling walker with steady gait. Did not want to go farther at this time. To sitting on side of bed after walk. Encouraged IS and to keep legs up.    Graylon Good, RN BSN  06/04/2016 9:24 AM

## 2016-06-04 NOTE — Consult Note (Signed)
Theresa Rogers is an 65 y.o. female referred by Dr Prescott Gum   Chief Complaint: Acute on CKD HPI: [id number] WF with CKD and baseline Scr 1.5-2 admitted 05/13/16 and underwent cath on 05/20/16 and then CABG on 05/27/16.  Scr 1.4-1.7 pre-op and since surgery it has progressively increased to 4.6 today.  Some hypotension post op with SBP in the 80's.  UO marginal last 6d with UO 500-800cc/d.  Was on small doses of IV lasix (ie 80mg  bid) that was [facility] 06/03/16.  Renal US shows Rt 10.8cm Lt 10.3CM with increased echogenicity but no hydro.  Had Rt IJ PC placed today.   Past Medical History:  Diagnosis Date  . Anemia   . CAD (coronary artery disease)    a. s/p MI/syncope in 2009 s/p remote stenting; b. NSTEMI 06/2015, cardiac cath 06/2015 pLAD 70% FFR 0.88 not clinically sig, dLAD 90%, mLCx 80% s/p PCI/DES 0%, pRCA 70%, mRCA 80%, dRCA 80%; c. cath 05/2016 mLAD 95%, dLAD 90%, dLAD 90%, pRCA 70%, mRCA 90%, dRCA 80%  . Chronic systolic CHF (congestive heart failure) ([place])   . CKD (chronic kidney disease), stage III   . Diabetes mellitus without complication ([name])   . HLD (hyperlipidemia)   . HTN (hypertension)   . Ischemic cardiomyopathy    a. echo 06/2015: EF 30-35%, diffuse HK, nable to exclude RWMA, LV diastolic function parameters were normal, mild MR, left atrium was mildly dilated, PASP 28 mm Hg, IVC was dilated c/w elevated CVP; b. echo 05/2016: EF 20%, severe global HK with AK in ant, anteroseptal, apical wall, GR1DD, mild MR, mod dilated LA, RV sys fxn mod reduced, PASP 53 mmHg, triv pericardial effusion, L pleural effusion  . PAF (paroxysmal atrial fibrillation) ([place])    a. new onset 06/2015; b. CHADS2VASc = 5 (CHF, HTN, age x 1, vascular dz, female); c. not on long term full dose anticoagulation in an effort to avoid triple therapy given her anemia     Past Surgical History:  Procedure Laterality Date  . CARDIAC CATHETERIZATION N/A 06/22/2015   Procedure: Coronary Angiogram;  Surgeon: Minna Merritts, MD;  Location: [place] [facility];  Service: Cardiovascular;  Laterality: N/A;  . CARDIAC CATHETERIZATION N/A 06/26/2015   Procedure: Coronary Stent Intervention;  Surgeon: Wellington Hampshire, MD;  Location: [place] [facility];  Service: Cardiovascular;  Laterality: N/A;  . CARDIAC CATHETERIZATION N/A 08/03/2015   Procedure: Coronary Stent Intervention;  Surgeon: Wellington Hampshire, MD;  Location: [place] [facility];  Service: Cardiovascular;  Laterality: N/A;  . CARDIAC CATHETERIZATION N/A 05/20/2016   Procedure: Right/Left Heart Cath and Coronary Angiography;  Surgeon: Wellington Hampshire, MD;  Location: [place] [facility];  Service: Cardiovascular;  Laterality: N/A;  . CORONARY ANGIOPLASTY WITH STENT PLACEMENT    . CORONARY ARTERY BYPASS GRAFT N/A 05/27/2016   Procedure: CORONARY ARTERY BYPASS GRAFTING (CABG) x3 using left internal mammary artery and left greater saphenous vein harvested endoscopically;  Surgeon: Ivin Poot, MD;  Location: [place];  Service: Open Heart Surgery;  Laterality: N/A;  . TEE WITHOUT CARDIOVERSION N/A 05/27/2016   Procedure: TRANSESOPHAGEAL ECHOCARDIOGRAM (TEE);  Surgeon: Ivin Poot, MD;  Location: [place];  Service: Open Heart Surgery;  Laterality: N/A;    Family History  Problem Relation Age of Onset  . CAD Mother   . Alzheimer's disease Mother   . Prostate cancer Father   . Diabetes Brother   . Kidney failure Brother    Social History:  reports that she has never smoked. She has never used smokeless tobacco. She reports that she does not drink alcohol or use drugs.  Allergies:  Allergies  Allergen Reactions  . No Known Allergies     Medications Prior to Admission  Medication Sig Dispense Refill  . aspirin EC 81 MG EC tablet Take 1 tablet (81 mg total) by mouth daily.    Marland Kitchen atorvastatin (LIPITOR) 40 MG tablet Take 1 tablet (40 mg total) by mouth daily at 6 PM. 30 tablet 0  . carvedilol (COREG) 6.25 MG tablet Take 1 tablet (6.25 mg total)  by mouth 2 (two) times daily with a meal. 60 tablet 0  . clopidogrel (PLAVIX) 75 MG tablet Take 75 mg by mouth daily.    . furosemide (LASIX) 40 MG tablet Take 1.5 tablets (60 mg total) by mouth 2 (two) times daily. 60 tablet 0  . guaiFENesin-codeine 100-10 MG/5ML syrup Take 5 mLs by mouth 3 (three) times daily as needed for cough. 60 mL 0  . insulin glargine (LANTUS) 100 UNIT/ML injection Inject 15 Units into the skin at bedtime.     . insulin lispro (HUMALOG) 100 UNIT/ML injection Inject 5-12 Units into the skin 3 (three) times daily before meals. Per sliding scale    . isosorbide mononitrate (IMDUR) 30 MG 24 hr tablet Take 1 tablet (30 mg total) by mouth 2 (two) times daily. 60 tablet 0  . potassium chloride SA (K-DUR,KLOR-CON) 20 MEQ tablet Take 1 tablet (20 mEq total) by mouth 2 (two) times daily. 60 tablet 0     Lab Results: UA: wbc 6-30, 100mg  protein TNTC RBC's done 06/01/16  Recent Labs  06/02/16 0425 06/03/16 0408 06/04/16 0834  WBC 14.2* 12.2* 11.6*  HGB 7.8* 8.7* 9.5*  HCT 23.8* 26.2* 28.1*  PLT 429* 454* 532*   BMET  Recent Labs  06/02/16 0425 06/03/16 0408 06/04/16 0834  NA 131* 128* 128*  K 3.8 5.2* 4.9  CL 99* 95* 94*  CO2 23 22 22   GLUCOSE 57* 313* 124*  BUN 93* 97* 111*  CREATININE 3.08* 3.81* 4.68*  CALCIUM 8.2* 8.0* 8.3*   LFT  Recent Labs  06/04/16 0834  PROT 5.9*  ALBUMIN 2.6*  AST 31  ALT 36  ALKPHOS 373*  BILITOT 0.5   Dg Chest 2 View  Result Date: 06/04/2016 CLINICAL DATA:  Atelectasis. EXAM: CHEST  2 VIEW COMPARISON:  06/01/2016. FINDINGS: Right PICC line in stable position. Prior CABG. Stable cardiomegaly. Partial clearing of bilateral infiltrates. Low lung volumes with basilar atelectasis. No pneumothorax. IMPRESSION: 1.  Right PICC line in stable position. 2. Prior CABG.  Stable cardiomegaly. 3. Partial clearing of bilateral infiltrates. Low lung volumes with mild basilar atelectasis. Electronically Signed   By: Marcello Moores  Register    On: 06/04/2016 07:35   US Renal  Result Date: 06/03/2016 CLINICAL DATA:  Acute kidney injury EXAM: RENAL / URINARY TRACT ULTRASOUND COMPLETE COMPARISON:  07/04/2015 FINDINGS: Right Kidney: Length: 10.8 cm. Echogenic renal parenchyma. No mass or hydronephrosis. Left Kidney: Length: 10.3 cm. Echogenic renal parenchyma. 4 mm lower pole calculus. No hydronephrosis. Bladder: Poorly visualized/ underdistended. Additional comments: Large right pleural effusion. Mild perihepatic ascites. Layering gallbladder sludge. IMPRESSION: 4 mm left lower pole renal calculus.  No hydronephrosis. Echogenic renal parenchyma bilaterally, suggesting medical renal disease. Electronically Signed   By: Julian Hy M.D.   On: 06/03/2016 16:30    ROS: Appetite marginal since surgery No SOB Incisional CP No Abd pain No dysuria No new  arthritic CO  PHYSICAL EXAM: Blood pressure (!) 122/55, pulse 63, temperature 97.7 F (36.5 C), temperature source Oral, resp. rate 18, height 5\' 4"  (1.626 m), weight 75.6 kg (166 lb 11.2 oz), SpO2 91 %. HEENT: PERRLA EOMI NECK: Mild JVD LUNGS:Decreased BS bases CARDIAC:RRR wo MRG ABD:+ BS NTND No HSM EXT:2+ edema + prescaral edema NEURO:CNI Ox3 No asterixis Rt IJ temp HD cath  Assessment: 1. Acute on CKD 3 post CABG  (followed by nephrology in [place]) 2. SP cath and CABG 3. DM 4. Ischemic cardiomyopathy  EF 20-25%  PLAN: 1. Will resume lasix as her dose was not maxed out.  If still does not diurese then HD 2. Daily labs 3.  Renal diet   Malon Branton T 06/04/2016, 2:39 PM

## 2016-06-05 LAB — RENAL FUNCTION PANEL
ALBUMIN: 2.5 g/dL — AB (ref 3.5–5.0)
ANION GAP: 11 (ref 5–15)
BUN: 118 mg/dL — ABNORMAL HIGH (ref 6–20)
CO2: 24 mmol/L (ref 22–32)
Calcium: 8.3 mg/dL — ABNORMAL LOW (ref 8.9–10.3)
Chloride: 92 mmol/L — ABNORMAL LOW (ref 101–111)
Creatinine, Ser: 5.16 mg/dL — ABNORMAL HIGH (ref 0.44–1.00)
GFR calc Af Amer: 9 mL/min — ABNORMAL LOW (ref >60)
GFR calc non Af Amer: 8 mL/min — ABNORMAL LOW (ref >60)
GLUCOSE: 136 mg/dL — AB (ref 65–99)
PHOSPHORUS: 5.7 mg/dL — AB (ref 2.5–4.6)
POTASSIUM: 4.8 mmol/L (ref 3.5–5.1)
Sodium: 127 mmol/L — ABNORMAL LOW (ref 135–145)

## 2016-06-05 LAB — COOXEMETRY PANEL
Carboxyhemoglobin: 1.2 % (ref 0.5–1.5)
Methemoglobin: 0.7 % (ref 0.0–1.5)
O2 Saturation: 49.8 %
Total hemoglobin: 11 g/dL — ABNORMAL LOW (ref 12.0–16.0)

## 2016-06-05 LAB — GLUCOSE, CAPILLARY
GLUCOSE-CAPILLARY: 218 mg/dL — AB (ref 65–99)
Glucose-Capillary: 104 mg/dL — ABNORMAL HIGH (ref 65–99)
Glucose-Capillary: 157 mg/dL — ABNORMAL HIGH (ref 65–99)
Glucose-Capillary: 194 mg/dL — ABNORMAL HIGH (ref 65–99)

## 2016-06-05 LAB — PROTIME-INR
INR: 1.87
PROTHROMBIN TIME: 21.7 s — AB (ref 11.4–15.2)

## 2016-06-05 LAB — ALT: ALT: 34 U/L (ref 14–54)

## 2016-06-05 MED ORDER — AMIODARONE LOAD VIA INFUSION
150.0000 mg | Freq: Once | INTRAVENOUS | Status: AC
Start: 2016-06-05 — End: 2016-06-05
  Administered 2016-06-05: 150 mg via INTRAVENOUS
  Filled 2016-06-05: qty 83.34

## 2016-06-05 MED ORDER — AMIODARONE HCL 200 MG PO TABS: 400.0000 mg | ORAL_TABLET | Freq: Two times a day (BID) | ORAL | Status: DC

## 2016-06-05 MED ORDER — AMIODARONE HCL IN DEXTROSE 360-4.14 MG/200ML-% IV SOLN
30.0000 mg/h | INTRAVENOUS | Status: DC
  Administered 2016-06-05 – 2016-06-07 (×5): 30 mg/h via INTRAVENOUS
  Filled 2016-06-05 (×5): qty 200

## 2016-06-05 MED ORDER — ALBUTEROL SULFATE (2.5 MG/3ML) 0.083% IN NEBU
INHALATION_SOLUTION | RESPIRATORY_TRACT | Status: AC
  Filled 2016-06-05: qty 3

## 2016-06-05 NOTE — Progress Notes (Signed)
CARDIAC REHAB PHASE I   PRE:  Rate/Rhythm: 66 SR  BP:  Sitting: 145/77        SaO2: 95 RA  MODE:  Ambulation: 300 ft   POST:  Rate/Rhythm: 120 a fib  BP:  Sitting: 124/89         SaO2: 92 RA  Pt ambulated 300 ft on RA, rolling walker, IV, assist x1, steady gait, tolerated well. Pt c/o DOE, denies any other complaints, declined rest stop. Pt appears to be in a fib upon return to room, RN notified. Pt to edge of bed per pt request after walk, call bell within reach. Will follow.  [phone] Lenna Sciara, RN, BSN 06/05/2016 10:36 AM

## 2016-06-05 NOTE — Progress Notes (Signed)
Pt is back in afib. MD notified. Orders received to bolus pt with amiodarone and continue drip. Will continue to monitor pt.  Grant Fontana BSN, RN

## 2016-06-05 NOTE — Progress Notes (Signed)
Epicardial pacing wires removed per order. Wires intact upon removal. Pt tolerated well. Gauze dressings placed over sites. BP being monitored per protocol. Pt on bed rest for 1 hour. Will continue to monitor pt.   Grant Fontana BSN, RN

## 2016-06-05 NOTE — Progress Notes (Addendum)
      [address]       [place] [address]             [phone]      9 Days Post-Op Procedure(s) (LRB): CORONARY ARTERY BYPASS GRAFTING (CABG) x3 using left internal mammary artery and left greater saphenous vein harvested endoscopically (N/A) TRANSESOPHAGEAL ECHOCARDIOGRAM (TEE) (N/A) Subjective: Feels okay this morning.  Objective: Vital signs in last 24 hours: Temp:  [97.5 F (36.4 C)-97.9 F (36.6 C)] 97.5 F (36.4 C) (12/27 0535) Pulse Rate:  [63-66] 64 (12/27 0535) Cardiac Rhythm: Normal sinus rhythm (12/27 0700) Resp:  [19-20] 20 (12/27 0535) BP: (139-141)/(66-73) 139/66 (12/27 0535) SpO2:  [93 %-97 %] 96 % (12/27 0535) Weight:  [169 lb 1.6 oz (76.7 kg)] 169 lb 1.6 oz (76.7 kg) (12/27 0535)     Intake/Output from previous day: 12/26 0701 - 12/27 0700 In: 649.3 [P.O.:240; I.V.:409.3] Out: 600 [Urine:600] Intake/Output this shift: Total I/O In: -  Out: 125 [Urine:125]  General appearance: alert, cooperative and no distress Heart: regular rate and rhythm Lungs: clear to auscultation bilaterally Abdomen: soft, non-tender; bowel sounds normal; no masses,  no organomegaly Extremities: no edema Wound: clean and dry  Lab Results:  Recent Labs  06/03/16 0408 06/04/16 0834  WBC 12.2* 11.6*  HGB 8.7* 9.5*  HCT 26.2* 28.1*  PLT 454* 532*   BMET:  Recent Labs  06/03/16 0408 06/04/16 0834  NA 128* 128*  K 5.2* 4.9  CL 95* 94*  CO2 22 22  GLUCOSE 313* 124*  BUN 97* 111*  CREATININE 3.81* 4.68*  CALCIUM 8.0* 8.3*    PT/INR:  Recent Labs  06/05/16 0506  LABPROT 21.7*  INR 1.87   ABG    Component Value Date/Time   PHART 7.425 05/28/2016 0512   HCO3 25.9 05/28/2016 0512   TCO2 28 05/29/2016 1703   ACIDBASEDEF 1.0 05/27/2016 1417   O2SAT 49.8 06/05/2016 0700   CBG (last 3)   Recent Labs  06/04/16 1640 06/04/16 2104 06/05/16 0613  GLUCAP 166* 186* 104*    Assessment/Plan: S/P Procedure(s) (LRB): CORONARY ARTERY  BYPASS GRAFTING (CABG) x3 using left internal mammary artery and left greater saphenous vein harvested endoscopically (N/A) TRANSESOPHAGEAL ECHOCARDIOGRAM (TEE) (N/A)  1. CV - Had a fib 12/25. SR in the 60's. On Amiodarone and Milrinone drips. Also, on Plavix 75 mg daily and Coumadin. INR 1.87. coox 49.8%. Heart failure is following. Epicardial pacing wires in place.  2.  Pulmonary - On room air. Encourage incentive spirometer.  3.  Acute blood loss anemia - Last H and H stable. 4. DM-CBGs 166/186/104. On Pre op HGA1C 11.3. Will need close medical follow up after discharge. 5. Acute on chronic renal failure-Creatinine up to 4.68 yesterday. Renal US yesterday showed 4 mm left renal calculus but no hydronephrosis. Also showed echogenic renal parenchyma bilaterally, suggesting medical renal disease. Will likely need HD.   Plan: Discontinue epicardial pacing wires.  Likely will require HD today. Weaning of Milrinone per heart failure. Remains on IV Amio since 12/23, discussed with heart failure.     LOS: 16 days    Theresa Rogers 06/05/2016  Agree with above. Wt is 20 lbs over preop. She will need HD. Co-ox is marginal at 50 on milrinone. Heart failure team will decide about milrinone and amio.

## 2016-06-05 NOTE — Progress Notes (Signed)
Advanced Heart Failure Rounding Note   Subjective:    Transferred from [facility] with volume overload despite IV diuresis + milrinone. Continues to diurese with IV lasix. CVP down to 11 Co-ox 65% on milrinone 0.96mcg/kg/min  Now s/p CABG x 3 on 05/27/16  Milrinone and IV lasix restarted 12/23.   Remains very frustrated. Continues to swell.  U/o about 600 ml. Weight up another 3 pounds.  Co-ox 50% on milrinone. Creatinine 5.2   Objective:   Weight Range:  Vital Signs:   Temp:  [97.5 F (36.4 C)-97.9 F (36.6 C)] 97.5 F (36.4 C) (12/27 0535) Pulse Rate:  [63-66] 64 (12/27 0535) Resp:  [19-20] 20 (12/27 0535) BP: (139-141)/(66-73) 139/66 (12/27 0535) SpO2:  [93 %-97 %] 96 % (12/27 0535) Weight:  [76.7 kg (169 lb 1.6 oz)] 76.7 kg (169 lb 1.6 oz) (12/27 0535) Last BM Date: 06/04/16  Weight change: Filed Weights   06/03/16 0501 06/04/16 0551 06/05/16 0535  Weight: 74 kg (163 lb 1.6 oz) 75.6 kg (166 lb 11.2 oz) 76.7 kg (169 lb 1.6 oz)    Intake/Output:   Intake/Output Summary (Last 24 hours) at 06/05/16 0959 Last data filed at 06/05/16 0824  Gross per 24 hour  Intake           649.26 ml  Output              725 ml  Net           -75.74 ml     Physical Exam: General:  Sitting in bed. NAD HEENT: Normal Neck: supple. JVP remains elevated to jaw. Carotids 2+ bilat; no bruits. No thyromegaly or nodule noted.  Cor: sternal dressing RRR Lungs: Diminished basilar sounds.  Abdomen: soft, NT, ND, no HSM. No bruits or masses. +BS  Extremities: no cyanosis, clubbing, rash, 2-3+ edema up into thighs.  Neuro: alert & orientedx3, cranial nerves grossly intact. moves all 4 extremities w/o difficulty. Affect pleasant  Telemetry: Reviewed, NSR 60-70s    Labs: Basic Metabolic Panel:  Recent Labs Lab 06/01/16 0412 06/02/16 0425 06/03/16 0408 06/04/16 0834 06/05/16 0801  NA 132* 131* 128* 128* 127*  K 4.5 3.8 5.2* 4.9 4.8  CL 99* 99* 95* 94* 92*  CO2 24 23 22 22 24     GLUCOSE 164* 57* 313* 124* 136*  BUN 90* 93* 97* 111* 118*  CREATININE 3.00* 3.08* 3.81* 4.68* 5.16*  CALCIUM 8.5* 8.2* 8.0* 8.3* 8.3*  PHOS  --   --   --   --  5.7*    Liver Function Tests:  Recent Labs Lab 06/04/16 0834 06/05/16 0801  AST 31  --   ALT 36  --   ALKPHOS 373*  --   BILITOT 0.5  --   PROT 5.9*  --   ALBUMIN 2.6* 2.5*   No results for input(s): LIPASE, AMYLASE in the last 168 hours. No results for input(s): AMMONIA in the last 168 hours.  CBC:  Recent Labs Lab 05/31/16 0500 06/01/16 0412 06/02/16 0425 06/03/16 0408 06/04/16 0834  WBC 13.4* 14.9* 14.2* 12.2* 11.6*  HGB 8.7* 9.2* 7.8* 8.7* 9.5*  HCT 27.1* 27.6* 23.8* 26.2* 28.1*  MCV 84.7 80.9 81.0 79.6 79.2  PLT 344 437* 429* 454* 532*    Cardiac Enzymes: No results for input(s): CKTOTAL, CKMB, CKMBINDEX, TROPONINI in the last 168 hours.  BNP: BNP (last 3 results)  Recent Labs  06/19/15 0923 05/13/16 0905 06/04/16 0834  BNP 1,782.0* 2,790.0* 3,366.3*    ProBNP (  last 3 results) No results for input(s): PROBNP in the last 8760 hours.    Other results:  Imaging: Dg Chest 2 View  Result Date: 06/04/2016 CLINICAL DATA:  Atelectasis. EXAM: CHEST  2 VIEW COMPARISON:  06/01/2016. FINDINGS: Right PICC line in stable position. Prior CABG. Stable cardiomegaly. Partial clearing of bilateral infiltrates. Low lung volumes with basilar atelectasis. No pneumothorax. IMPRESSION: 1.  Right PICC line in stable position. 2. Prior CABG.  Stable cardiomegaly. 3. Partial clearing of bilateral infiltrates. Low lung volumes with mild basilar atelectasis. Electronically Signed   By: Marcello Moores  Register   On: 06/04/2016 07:35   US Renal  Result Date: 06/03/2016 CLINICAL DATA:  Acute kidney injury EXAM: RENAL / URINARY TRACT ULTRASOUND COMPLETE COMPARISON:  07/04/2015 FINDINGS: Right Kidney: Length: 10.8 cm. Echogenic renal parenchyma. No mass or hydronephrosis. Left Kidney: Length: 10.3 cm. Echogenic renal  parenchyma. 4 mm lower pole calculus. No hydronephrosis. Bladder: Poorly visualized/ underdistended. Additional comments: Large right pleural effusion. Mild perihepatic ascites. Layering gallbladder sludge. IMPRESSION: 4 mm left lower pole renal calculus.  No hydronephrosis. Echogenic renal parenchyma bilaterally, suggesting medical renal disease. Electronically Signed   By: Julian Hy M.D.   On: 06/03/2016 16:30   Ir Fluoro Guide Cv Line Right  Result Date: 06/04/2016 INDICATION: 65 year old female with a history of renal failure EXAM: TUNNELED CENTRAL VENOUS HEMODIALYSIS CATHETER PLACEMENT WITH ULTRASOUND AND FLUOROSCOPIC GUIDANCE MEDICATIONS: None ANESTHESIA/SEDATION: None FLUOROSCOPY TIME:  Fluoroscopy Time: 0 minutes 6 seconds (0.4 mGy). COMPLICATIONS: None PROCEDURE: Informed written consent was obtained from the patient's family after a discussion of the risks, benefits, and alternatives to treatment. Questions regarding the procedure were encouraged and answered. The right neck was prepped with chlorhexidine in a sterile fashion, and a sterile drape was applied covering the operative field. Maximum barrier sterile technique with sterile gowns and gloves were used for the procedure. A timeout was performed prior to the initiation of the procedure. A single wall needle was used access the right internal jugular vein under direct, real-time ultrasound guidance after the overlying soft tissues were anesthetized with 1% lidocaine with epinephrine. Ultrasound image documentation was performed. Wire was advanced to the level of the IVC. A 19 cm hemodialysis catheter was then placed over the wire after serial dilation of the soft tissues. Final catheter positioning was confirmed and documented with a spot radiographic image. The catheter aspirates and flushes normally. The catheter was flushed with appropriate volume heparin dwells. Dressings were applied. The patient tolerated the procedure well without  immediate post procedural complication. IMPRESSION: Status post temporary hemodialysis catheter at the right IJ. Catheter can be easily converted if need be. Signed, Dulcy Fanny. Earleen Newport, DO [facility] Electronically Signed   By: Corrie Mckusick D.O.   On: 06/04/2016 17:03   Ir US Guide Vasc Access Right  Result Date: 06/04/2016 INDICATION: 66 year old female with a history of renal failure EXAM: TUNNELED CENTRAL VENOUS HEMODIALYSIS CATHETER PLACEMENT WITH ULTRASOUND AND FLUOROSCOPIC GUIDANCE MEDICATIONS: None ANESTHESIA/SEDATION: None FLUOROSCOPY TIME:  Fluoroscopy Time: 0 minutes 6 seconds (0.4 mGy). COMPLICATIONS: None PROCEDURE: Informed written consent was obtained from the patient's family after a discussion of the risks, benefits, and alternatives to treatment. Questions regarding the procedure were encouraged and answered. The right neck was prepped with chlorhexidine in a sterile fashion, and a sterile drape was applied covering the operative field. Maximum barrier sterile technique with sterile gowns and gloves were used for the procedure. A timeout was performed prior to the  initiation of the procedure. A single wall needle was used access the right internal jugular vein under direct, real-time ultrasound guidance after the overlying soft tissues were anesthetized with 1% lidocaine with epinephrine. Ultrasound image documentation was performed. Wire was advanced to the level of the IVC. A 19 cm hemodialysis catheter was then placed over the wire after serial dilation of the soft tissues. Final catheter positioning was confirmed and documented with a spot radiographic image. The catheter aspirates and flushes normally. The catheter was flushed with appropriate volume heparin dwells. Dressings were applied. The patient tolerated the procedure well without immediate post procedural complication. IMPRESSION: Status post temporary hemodialysis catheter  at the right IJ. Catheter can be easily converted if need be. Signed, Dulcy Fanny. Earleen Newport, DO [facility] Radiology Electronically Signed   By: Corrie Mckusick D.O.   On: 06/04/2016 17:03     Medications:     Scheduled Medications: . alteplase  2 mg Intracatheter Once  . aspirin EC  81 mg Oral Daily  . atorvastatin  40 mg Oral q1800  . bisacodyl  10 mg Oral Daily   Or  . bisacodyl  10 mg Rectal Daily  . docusate sodium  200 mg Oral Daily  . enoxaparin (LOVENOX) injection  30 mg Subcutaneous Q24H  . furosemide  160 mg Intravenous Q8H  . guaiFENesin  600 mg Oral BID  . insulin aspart  0-15 Units Subcutaneous TID WC  . insulin detemir  15 Units Subcutaneous QHS  . lactulose  20 g Oral Once  . metoCLOPramide (REGLAN) injection  10 mg Intravenous Q6H  . pantoprazole  40 mg Oral Daily  . warfarin  2.5 mg Oral q1800  . Warfarin - Physician Dosing Inpatient   Does not apply q1800    Infusions: . amiodarone 30 mg/hr (06/05/16 0007)  . milrinone 0.25 mcg/kg/min (06/05/16 0955)    PRN Medications: guaiFENesin-dextromethorphan, lidocaine (PF), ondansetron (ZOFRAN) IV, oxyCODONE, promethazine, sodium chloride flush, traMADol   Assessment:   1. A/C Systolic Heart Failure  2. NSTEMI with CAD-severe 2 vessel disease LAD RCA    --s/pg CABG 12/18 with LIMA-LAD, SVG-RCA and SVG-diagonal 3. AKI on CKD 4.Uncontrolled DM- Hgb AC 11.3  5. Hyperkalemia  6. Elevated TSH 7. COPD  8. PAF on 12/20 and 12/21 - resolved with IV amio   Plan/Discussion:    Volume status continues to trend up. Creatinine 5.2 this am and BUN > 100. Renal US without hydronephrosis 06/03/16 + medicorenal disease. Trialysis cath in place.  Will start HD today.  If needs tunneled cath, will have to hold Plavix for 5 days, so will hold for now.    Co-ox marginal on milrinone. Hopefully will improve with volume removal.   No ACE/ARB with AKI. No b-blocker with low output.  Continue ASA and statin. OK to stop plavix as stent was in 06/2015.    Remains in NSR after IV amio. Given several recurrences coumadin started 12/24. INR 1.87 this am. Once INR > 2.0 can stop ASA and use warfarin alone.  Holding Plavix due to possible need for tunneled HD cath. Hopefully Kidneys will improve. Can switch to po amio.    Length of Stay: 16  Glori Bickers, MD  06/05/2016, 9:59 AM  Advanced Heart Failure Team Pager [phone] (M-F; [place])  Please contact [facility] for night-coverage after hours (4p -7a ) and weekends on [email or web address]

## 2016-06-05 NOTE — Progress Notes (Signed)
S:No changes, appetite marginal.  Wants to go home O:BP 139/66 (BP Location: Left Arm)   Pulse 64   Temp 97.5 F (36.4 C) (Oral)   Resp 20   Ht 5\' 4"  (1.626 m)   Wt 76.7 kg (169 lb 1.6 oz)   SpO2 96%   BMI 29.03 kg/m   Intake/Output Summary (Last 24 hours) at 06/05/16 0651 Last data filed at 06/05/16 0559  Gross per 24 hour  Intake           649.26 ml  Output              500 ml  Net           149.26 ml   Weight change: 1.089 kg (2 lb 6.4 oz) QHU:TMLYY and alert CVS:RRR Resp:decreased BS bases Abd:+ BS NTND Ext:3+ edema NEURO:CNI Ox3, no asterixis Rt IJ Temp HD cath   . alteplase  2 mg Intracatheter Once  . aspirin EC  81 mg Oral Daily  . atorvastatin  40 mg Oral q1800  . bisacodyl  10 mg Oral Daily   Or  . bisacodyl  10 mg Rectal Daily  . docusate sodium  200 mg Oral Daily  . enoxaparin (LOVENOX) injection  30 mg Subcutaneous Q24H  . furosemide  160 mg Intravenous Q8H  . guaiFENesin  600 mg Oral BID  . insulin aspart  0-15 Units Subcutaneous TID WC  . insulin detemir  15 Units Subcutaneous QHS  . lactulose  20 g Oral Once  . metoCLOPramide (REGLAN) injection  10 mg Intravenous Q6H  . pantoprazole  40 mg Oral Daily  . warfarin  2.5 mg Oral q1800  . Warfarin - Physician Dosing Inpatient   Does not apply q1800   Dg Chest 2 View  Result Date: 06/04/2016 CLINICAL DATA:  Atelectasis. EXAM: CHEST  2 VIEW COMPARISON:  06/01/2016. FINDINGS: Right PICC line in stable position. Prior CABG. Stable cardiomegaly. Partial clearing of bilateral infiltrates. Low lung volumes with basilar atelectasis. No pneumothorax. IMPRESSION: 1.  Right PICC line in stable position. 2. Prior CABG.  Stable cardiomegaly. 3. Partial clearing of bilateral infiltrates. Low lung volumes with mild basilar atelectasis. Electronically Signed   By: Marcello Moores  Register   On: 06/04/2016 07:35   US Renal  Result Date: 06/03/2016 CLINICAL DATA:  Acute kidney injury EXAM: RENAL / URINARY TRACT ULTRASOUND  COMPLETE COMPARISON:  07/04/2015 FINDINGS: Right Kidney: Length: 10.8 cm. Echogenic renal parenchyma. No mass or hydronephrosis. Left Kidney: Length: 10.3 cm. Echogenic renal parenchyma. 4 mm lower pole calculus. No hydronephrosis. Bladder: Poorly visualized/ underdistended. Additional comments: Large right pleural effusion. Mild perihepatic ascites. Layering gallbladder sludge. IMPRESSION: 4 mm left lower pole renal calculus.  No hydronephrosis. Echogenic renal parenchyma bilaterally, suggesting medical renal disease. Electronically Signed   By: Julian Hy M.D.   On: 06/03/2016 16:30   Ir Fluoro Guide Cv Line Right  Result Date: 06/04/2016 INDICATION: 65 year old female with a history of renal failure EXAM: TUNNELED CENTRAL VENOUS HEMODIALYSIS CATHETER PLACEMENT WITH ULTRASOUND AND FLUOROSCOPIC GUIDANCE MEDICATIONS: None ANESTHESIA/SEDATION: None FLUOROSCOPY TIME:  Fluoroscopy Time: 0 minutes 6 seconds (0.4 mGy). COMPLICATIONS: None PROCEDURE: Informed written consent was obtained from the patient's family after a discussion of the risks, benefits, and alternatives to treatment. Questions regarding the procedure were encouraged and answered. The right neck was prepped with chlorhexidine in a sterile fashion, and a sterile drape was applied covering the operative field. Maximum barrier sterile technique with sterile gowns and gloves were used for  the procedure. A timeout was performed prior to the initiation of the procedure. A single wall needle was used access the right internal jugular vein under direct, real-time ultrasound guidance after the overlying soft tissues were anesthetized with 1% lidocaine with epinephrine. Ultrasound image documentation was performed. Wire was advanced to the level of the IVC. A 19 cm hemodialysis catheter was then placed over the wire after serial dilation of the soft tissues. Final catheter positioning was confirmed and documented with a spot radiographic image. The  catheter aspirates and flushes normally. The catheter was flushed with appropriate volume heparin dwells. Dressings were applied. The patient tolerated the procedure well without immediate post procedural complication. IMPRESSION: Status post temporary hemodialysis catheter at the right IJ. Catheter can be easily converted if need be. Signed, Dulcy Fanny. Earleen Newport, DO [facility] Radiology Electronically Signed   By: Corrie Mckusick D.O.   On: 06/04/2016 17:03   Ir US Guide Vasc Access Right  Result Date: 06/04/2016 INDICATION: 65 year old female with a history of renal failure EXAM: TUNNELED CENTRAL VENOUS HEMODIALYSIS CATHETER PLACEMENT WITH ULTRASOUND AND FLUOROSCOPIC GUIDANCE MEDICATIONS: None ANESTHESIA/SEDATION: None FLUOROSCOPY TIME:  Fluoroscopy Time: 0 minutes 6 seconds (0.4 mGy). COMPLICATIONS: None PROCEDURE: Informed written consent was obtained from the patient's family after a discussion of the risks, benefits, and alternatives to treatment. Questions regarding the procedure were encouraged and answered. The right neck was prepped with chlorhexidine in a sterile fashion, and a sterile drape was applied covering the operative field. Maximum barrier sterile technique with sterile gowns and gloves were used for the procedure. A timeout was performed prior to the initiation of the procedure. A single wall needle was used access the right internal jugular vein under direct, real-time ultrasound guidance after the overlying soft tissues were anesthetized with 1% lidocaine with epinephrine. Ultrasound image documentation was performed. Wire was advanced to the level of the IVC. A 19 cm hemodialysis catheter was then placed over the wire after serial dilation of the soft tissues. Final catheter positioning was confirmed and documented with a spot radiographic image. The catheter aspirates and flushes normally. The catheter was flushed with appropriate volume  heparin dwells. Dressings were applied. The patient tolerated the procedure well without immediate post procedural complication. IMPRESSION: Status post temporary hemodialysis catheter at the right IJ. Catheter can be easily converted if need be. Signed, Dulcy Fanny. Earleen Newport, DO [facility] Radiology Electronically Signed   By: Corrie Mckusick D.O.   On: 06/04/2016 17:03   BMET    Component Value Date/Time   NA 128 (L) 06/04/2016 0834   K 4.9 06/04/2016 0834   CL 94 (L) 06/04/2016 0834   CO2 22 06/04/2016 0834   GLUCOSE 124 (H) 06/04/2016 0834   BUN 111 (H) 06/04/2016 0834   CREATININE 4.68 (H) 06/04/2016 0834   CALCIUM 8.3 (L) 06/04/2016 0834   GFRNONAA 9 (L) 06/04/2016 0834   GFRAA 10 (L) 06/04/2016 0834   CBC    Component Value Date/Time   WBC 11.6 (H) 06/04/2016 0834   RBC 3.55 (L) 06/04/2016 0834   HGB 9.5 (L) 06/04/2016 0834   HCT 28.1 (L) 06/04/2016 0834   PLT 532 (H) 06/04/2016 0834   MCV 79.2 06/04/2016 0834   MCH 26.8 06/04/2016 0834   MCHC 33.8 06/04/2016 0834   RDW 15.9 (H) 06/04/2016 0834   LYMPHSABS 0.6 (L) 04/29/2016 1031   MONOABS 0.7 04/29/2016 1031   EOSABS 0.1 04/29/2016 1031   BASOSABS  0.1 04/29/2016 1031     Assessment:  1. Acute on CKD 3 post CABG.  UO from yest incomplete 2. SP Cath and CABG 3. DM 4. Ischemic cardiomyopathy  EF 20-25%  Plan: 1.  I cannot tell if a renal profile was done?  If not will run stat.  Discussed with pt that if Scr higher then will do HD today.  She is in agreement.   Alexine Pilant T

## 2016-06-06 LAB — RENAL FUNCTION PANEL
ANION GAP: 10 (ref 5–15)
Albumin: 2.2 g/dL — ABNORMAL LOW (ref 3.5–5.0)
BUN: 81 mg/dL — ABNORMAL HIGH (ref 6–20)
CHLORIDE: 93 mmol/L — AB (ref 101–111)
CO2: 25 mmol/L (ref 22–32)
Calcium: 7.6 mg/dL — ABNORMAL LOW (ref 8.9–10.3)
Creatinine, Ser: 3.95 mg/dL — ABNORMAL HIGH (ref 0.44–1.00)
GFR calc non Af Amer: 11 mL/min — ABNORMAL LOW (ref >60)
GFR, EST AFRICAN AMERICAN: 13 mL/min — AB (ref >60)
Glucose, Bld: 164 mg/dL — ABNORMAL HIGH (ref 65–99)
POTASSIUM: 3.9 mmol/L (ref 3.5–5.1)
Phosphorus: 4.7 mg/dL — ABNORMAL HIGH (ref 2.5–4.6)
Sodium: 128 mmol/L — ABNORMAL LOW (ref 135–145)

## 2016-06-06 LAB — HEPATITIS B SURFACE ANTIBODY, QUANTITATIVE

## 2016-06-06 LAB — GLUCOSE, CAPILLARY
GLUCOSE-CAPILLARY: 198 mg/dL — AB (ref 65–99)
Glucose-Capillary: 148 mg/dL — ABNORMAL HIGH (ref 65–99)
Glucose-Capillary: 119 mg/dL — ABNORMAL HIGH (ref 65–99)
Glucose-Capillary: 215 mg/dL — ABNORMAL HIGH (ref 65–99)

## 2016-06-06 LAB — COOXEMETRY PANEL
Carboxyhemoglobin: 1.4 % (ref 0.5–1.5)
Methemoglobin: 1 % (ref 0.0–1.5)
O2 Saturation: 60.9 %
Total hemoglobin: 9.7 g/dL — ABNORMAL LOW (ref 12.0–16.0)

## 2016-06-06 LAB — PROTIME-INR
INR: 1.87
Prothrombin Time: 21.8 seconds — ABNORMAL HIGH (ref 11.4–15.2)

## 2016-06-06 LAB — HEPATITIS B SURFACE ANTIGEN: HEP B S AG: NEGATIVE

## 2016-06-06 MED ORDER — METOCLOPRAMIDE HCL 5 MG/ML IJ SOLN
5.0000 mg | Freq: Four times a day (QID) | INTRAMUSCULAR | Status: DC
  Filled 2016-06-06: qty 2

## 2016-06-06 NOTE — Procedures (Signed)
Pt seen on HD.  Ap 70 Vp 90.  BFR 250.  SBP 154.  Tolerating HD well so far.

## 2016-06-06 NOTE — Progress Notes (Addendum)
      [address]       ,[place] [id number]             [phone]        10 Days Post-Op Procedure(s) (LRB): CORONARY ARTERY BYPASS GRAFTING (CABG) x3 using left internal mammary artery and left greater saphenous vein harvested endoscopically (N/A) TRANSESOPHAGEAL ECHOCARDIOGRAM (TEE) (N/A)  Subjective: Patient tired this am.  Objective: Vital signs in last 24 hours: Temp:  [97.7 F (36.5 C)-98.4 F (36.9 C)] 97.8 F (36.6 C) (12/28 0451) Pulse Rate:  [63-113] 64 (12/28 0451) Cardiac Rhythm: Other (Comment) (12/28 0352) Resp:  [18-20] 20 (12/28 0451) BP: (109-139)/(57-93) 134/64 (12/28 0451) SpO2:  [92 %-97 %] 93 % (12/28 0451) Weight:  [166 lb 7.2 oz (75.5 kg)-168 lb 6.9 oz (76.4 kg)] 166 lb 7.2 oz (75.5 kg) (12/28 0451)  Pre op weight 65 kg Current Weight  06/06/16 166 lb 7.2 oz (75.5 kg)      Intake/Output from previous day: 12/27 0701 - 12/28 0700 In: -  Out: 1625 [Urine:125]   Physical Exam:  Cardiovascular: RRR Pulmonary: Diminished at bases Abdomen: Soft, non tender, bowel sounds present. Extremities: Bilateral lower extremity edema. Wounds: Clean and dry.  No erythema or signs of infection.  Lab Results: CBC:  Recent Labs  06/04/16 0834  WBC 11.6*  HGB 9.5*  HCT 28.1*  PLT 532*   BMET:   Recent Labs  06/05/16 0801 06/06/16 0408  NA 127* 128*  K 4.8 3.9  CL 92* 93*  CO2 24 25  GLUCOSE 136* 164*  BUN 118* 81*  CREATININE 5.16* 3.95*  CALCIUM 8.3* 7.6*    PT/INR:  Lab Results  Component Value Date   INR 1.87 06/06/2016   INR 1.87 06/05/2016   INR 1.43 06/04/2016   ABG:  INR: Will add last result for INR, ABG once components are confirmed Will add last 4 CBG results once components are confirmed  Assessment/Plan:  1. CV - Tried to stop Amiodarone drip and put on oral, but had more a fib yesterday. SR in the 60's. Continue Amiodarone and Milrinone drips. Also, on  Coumadin. INR 1.87. Heart failure  following. 2.  Pulmonary - On room air. Encourage incentive spirometer. 3.  Acute blood loss anemia - Last H and H stable at 8.7 and 26.2. Had transfusion. 4. DM-CBGs 157/194/148. On Insulin. On Pre op HGA1C 11.3. Will need close medical follow up after discharge. 5. Acute on chronic renal failure-Creatinine down to 3.95. Had HD yesterday and is scheduled for HD today and in the am 6. Volume Overload-160 mg IV tid  ZIMMERMAN,DONIELLE MPA-C 06/06/2016,7:30 AM  I have seen and examined the patient and agree with the assessment and plan as outlined.  Rexene Alberts, MD

## 2016-06-06 NOTE — Progress Notes (Signed)
S: Did well with HD yest O:BP 134/64 (BP Location: Left Arm)   Pulse 64   Temp 97.8 F (36.6 C) (Oral)   Resp 20   Ht 5\' 4"  (1.626 m)   Wt 75.5 kg (166 lb 7.2 oz)   SpO2 93%   BMI 28.57 kg/m   Intake/Output Summary (Last 24 hours) at 06/06/16 0641 Last data filed at 06/05/16 1641  Gross per 24 hour  Intake                0 ml  Output             1725 ml  Net            -1725 ml   Weight change: -0.303 kg (-10.7 oz) DTO:IZTIW and alert CVS:RRR Resp:decreased BS bases Abd:+ BS NTND Ext:3+ edema NEURO:CNI Ox3, no asterixis Rt IJ Temp HD cath   . alteplase  2 mg Intracatheter Once  . aspirin EC  81 mg Oral Daily  . atorvastatin  40 mg Oral q1800  . bisacodyl  10 mg Oral Daily   Or  . bisacodyl  10 mg Rectal Daily  . docusate sodium  200 mg Oral Daily  . enoxaparin (LOVENOX) injection  30 mg Subcutaneous Q24H  . furosemide  160 mg Intravenous Q8H  . guaiFENesin  600 mg Oral BID  . insulin aspart  0-15 Units Subcutaneous TID WC  . insulin detemir  15 Units Subcutaneous QHS  . lactulose  20 g Oral Once  . metoCLOPramide (REGLAN) injection  10 mg Intravenous Q6H  . pantoprazole  40 mg Oral Daily  . warfarin  2.5 mg Oral q1800  . Warfarin - Physician Dosing Inpatient   Does not apply q1800   Ir Fluoro Guide Cv Line Right  Result Date: 06/04/2016 INDICATION: 65 year old female with a history of renal failure EXAM: TUNNELED CENTRAL VENOUS HEMODIALYSIS CATHETER PLACEMENT WITH ULTRASOUND AND FLUOROSCOPIC GUIDANCE MEDICATIONS: None ANESTHESIA/SEDATION: None FLUOROSCOPY TIME:  Fluoroscopy Time: 0 minutes 6 seconds (0.4 mGy). COMPLICATIONS: None PROCEDURE: Informed written consent was obtained from the patient's family after a discussion of the risks, benefits, and alternatives to treatment. Questions regarding the procedure were encouraged and answered. The right neck was prepped with chlorhexidine in a sterile fashion, and a sterile drape was applied covering the operative field.  Maximum barrier sterile technique with sterile gowns and gloves were used for the procedure. A timeout was performed prior to the initiation of the procedure. A single wall needle was used access the right internal jugular vein under direct, real-time ultrasound guidance after the overlying soft tissues were anesthetized with 1% lidocaine with epinephrine. Ultrasound image documentation was performed. Wire was advanced to the level of the IVC. A 19 cm hemodialysis catheter was then placed over the wire after serial dilation of the soft tissues. Final catheter positioning was confirmed and documented with a spot radiographic image. The catheter aspirates and flushes normally. The catheter was flushed with appropriate volume heparin dwells. Dressings were applied. The patient tolerated the procedure well without immediate post procedural complication. IMPRESSION: Status post temporary hemodialysis catheter at the right IJ. Catheter can be easily converted if need be. Signed, Dulcy Fanny. Earleen Newport, DO [facility] Radiology Electronically Signed   By: Corrie Mckusick D.O.   On: 06/04/2016 17:03   Ir US Guide Vasc Access Right  Result Date: 06/04/2016 INDICATION: 65 year old female with a history of renal failure EXAM: TUNNELED CENTRAL VENOUS HEMODIALYSIS CATHETER PLACEMENT WITH  ULTRASOUND AND FLUOROSCOPIC GUIDANCE MEDICATIONS: None ANESTHESIA/SEDATION: None FLUOROSCOPY TIME:  Fluoroscopy Time: 0 minutes 6 seconds (0.4 mGy). COMPLICATIONS: None PROCEDURE: Informed written consent was obtained from the patient's family after a discussion of the risks, benefits, and alternatives to treatment. Questions regarding the procedure were encouraged and answered. The right neck was prepped with chlorhexidine in a sterile fashion, and a sterile drape was applied covering the operative field. Maximum barrier sterile technique with sterile gowns and gloves were used for the procedure. A  timeout was performed prior to the initiation of the procedure. A single wall needle was used access the right internal jugular vein under direct, real-time ultrasound guidance after the overlying soft tissues were anesthetized with 1% lidocaine with epinephrine. Ultrasound image documentation was performed. Wire was advanced to the level of the IVC. A 19 cm hemodialysis catheter was then placed over the wire after serial dilation of the soft tissues. Final catheter positioning was confirmed and documented with a spot radiographic image. The catheter aspirates and flushes normally. The catheter was flushed with appropriate volume heparin dwells. Dressings were applied. The patient tolerated the procedure well without immediate post procedural complication. IMPRESSION: Status post temporary hemodialysis catheter at the right IJ. Catheter can be easily converted if need be. Signed, Dulcy Fanny. Earleen Newport, DO [facility] Radiology Electronically Signed   By: Corrie Mckusick D.O.   On: 06/04/2016 17:03   BMET    Component Value Date/Time   NA 128 (L) 06/06/2016 0408   K 3.9 06/06/2016 0408   CL 93 (L) 06/06/2016 0408   CO2 25 06/06/2016 0408   GLUCOSE 164 (H) 06/06/2016 0408   BUN 81 (H) 06/06/2016 0408   CREATININE 3.95 (H) 06/06/2016 0408   CALCIUM 7.6 (L) 06/06/2016 0408   GFRNONAA 11 (L) 06/06/2016 0408   GFRAA 13 (L) 06/06/2016 0408   CBC    Component Value Date/Time   WBC 11.6 (H) 06/04/2016 0834   RBC 3.55 (L) 06/04/2016 0834   HGB 9.5 (L) 06/04/2016 0834   HCT 28.1 (L) 06/04/2016 0834   PLT 532 (H) 06/04/2016 0834   MCV 79.2 06/04/2016 0834   MCH 26.8 06/04/2016 0834   MCHC 33.8 06/04/2016 0834   RDW 15.9 (H) 06/04/2016 0834   LYMPHSABS 0.6 (L) 04/29/2016 1031   MONOABS 0.7 04/29/2016 1031   EOSABS 0.1 04/29/2016 1031   BASOSABS 0.1 04/29/2016 1031     Assessment:  1. Acute on CKD 3 sec ischemic ATN post CABG.  2. SP Cath and CABG 3.  DM 4. Ischemic cardiomyopathy  EF 20-25%  Plan: 1.  Plan HD today and then again tomorrow and plan to give weekend off to see if renal fx recovering provided this floor can collect and record her UO 2. Daily labs Theresa Rogers T

## 2016-06-06 NOTE — Progress Notes (Signed)
CARDIAC REHAB PHASE I   PRE:  Rate/Rhythm: 68 SR  BP:  Supine:   Sitting: 141/66  Standing:    SaO2: 93%RA  MODE:  Ambulation: 150 ft   POST:  Rate/Rhythm: 68 SR  BP:  Supine:   Sitting: 122/63  Standing:    SaO2: 93%RA 1357-1430 Pt walked 150 ft on RA with rolling walker and minimal asst. Tolerated well. No DOE noted. To sitting on side of bed after walk. Only walked short distance due to just completed dialysis.   Graylon Good, RN BSN  06/06/2016 2:25 PM

## 2016-06-06 NOTE — Progress Notes (Signed)
Advanced Heart Failure Rounding Note   Subjective:    Transferred from [facility] with volume overload despite IV diuresis + milrinone. Continues to diurese with IV lasix. CVP down to 11 Co-ox 65% on milrinone 0.16mcg/kg/min  Now s/p CABG x 3 on 05/27/16  Milrinone and IV lasix restarted 12/23.   Started HD yesterday. Tolerated well. Feels better but still swollen. No SOB. Had another bout of AF yesterday so IV amio restarted. Now back in NSR  Objective:   Weight Range:  Vital Signs:   Temp:  [97.7 F (36.5 C)-98.4 F (36.9 C)] 97.8 F (36.6 C) (12/28 0451) Pulse Rate:  [63-113] 64 (12/28 0451) Resp:  [18-20] 20 (12/28 0451) BP: (109-139)/(57-93) 134/64 (12/28 0451) SpO2:  [92 %-97 %] 93 % (12/28 0451) Weight:  [75.5 kg (166 lb 7.2 oz)-76.4 kg (168 lb 6.9 oz)] 75.5 kg (166 lb 7.2 oz) (12/28 0451) Last BM Date: 06/04/16  Weight change: Filed Weights   06/05/16 1400 06/05/16 1641 06/06/16 0451  Weight: 76.4 kg (168 lb 6.9 oz) 76 kg (167 lb 8.8 oz) 75.5 kg (166 lb 7.2 oz)    Intake/Output:   Intake/Output Summary (Last 24 hours) at 06/06/16 0833 Last data filed at 06/06/16 0739  Gross per 24 hour  Intake                0 ml  Output             1800 ml  Net            -1800 ml     Physical Exam: General:  Sitting in bed. NAD HEENT: Normal Neck: supple. JVP remains elevated to jaw. Carotids 2+ bilat; no bruits. No thyromegaly or nodule noted.  Cor: sternal dressing RRR Lungs: Clear but diminished basilar sounds.  Abdomen: soft, NT, ND, no HSM. No bruits or masses. +BS  Extremities: no cyanosis, clubbing, rash, 2-3+ edema up into thighs.  Neuro: alert & orientedx3, cranial nerves grossly intact. moves all 4 extremities w/o difficulty. Affect pleasant  Telemetry: Reviewed, NSR 60-70s    Labs: Basic Metabolic Panel:  Recent Labs Lab 06/02/16 0425 06/03/16 0408 06/04/16 0834 06/05/16 0801 06/06/16 0408  NA 131* 128* 128* 127* 128*  K 3.8 5.2* 4.9 4.8 3.9  CL  99* 95* 94* 92* 93*  CO2 23 22 22 24 25   GLUCOSE 57* 313* 124* 136* 164*  BUN 93* 97* 111* 118* 81*  CREATININE 3.08* 3.81* 4.68* 5.16* 3.95*  CALCIUM 8.2* 8.0* 8.3* 8.3* 7.6*  PHOS  --   --   --  5.7* 4.7*    Liver Function Tests:  Recent Labs Lab 06/04/16 0834 06/05/16 0801 06/05/16 1428 06/06/16 0408  AST 31  --   --   --   ALT 36  --  34  --   ALKPHOS 373*  --   --   --   BILITOT 0.5  --   --   --   PROT 5.9*  --   --   --   ALBUMIN 2.6* 2.5*  --  2.2*   No results for input(s): LIPASE, AMYLASE in the last 168 hours. No results for input(s): AMMONIA in the last 168 hours.  CBC:  Recent Labs Lab 05/31/16 0500 06/01/16 0412 06/02/16 0425 06/03/16 0408 06/04/16 0834  WBC 13.4* 14.9* 14.2* 12.2* 11.6*  HGB 8.7* 9.2* 7.8* 8.7* 9.5*  HCT 27.1* 27.6* 23.8* 26.2* 28.1*  MCV 84.7 80.9 81.0 79.6 79.2  PLT 344  437* 429* 454* 532*    Cardiac Enzymes: No results for input(s): CKTOTAL, CKMB, CKMBINDEX, TROPONINI in the last 168 hours.  BNP: BNP (last 3 results)  Recent Labs  06/19/15 0923 05/13/16 0905 06/04/16 0834  BNP 1,782.0* 2,790.0* 3,366.3*    ProBNP (last 3 results) No results for input(s): PROBNP in the last 8760 hours.    Other results:  Imaging: Ir Fluoro Guide Cv Line Right  Result Date: 06/04/2016 INDICATION: 65 year old female with a history of renal failure EXAM: TUNNELED CENTRAL VENOUS HEMODIALYSIS CATHETER PLACEMENT WITH ULTRASOUND AND FLUOROSCOPIC GUIDANCE MEDICATIONS: None ANESTHESIA/SEDATION: None FLUOROSCOPY TIME:  Fluoroscopy Time: 0 minutes 6 seconds (0.4 mGy). COMPLICATIONS: None PROCEDURE: Informed written consent was obtained from the patient's family after a discussion of the risks, benefits, and alternatives to treatment. Questions regarding the procedure were encouraged and answered. The right neck was prepped with chlorhexidine in a sterile fashion, and a sterile drape was applied covering the operative field. Maximum barrier  sterile technique with sterile gowns and gloves were used for the procedure. A timeout was performed prior to the initiation of the procedure. A single wall needle was used access the right internal jugular vein under direct, real-time ultrasound guidance after the overlying soft tissues were anesthetized with 1% lidocaine with epinephrine. Ultrasound image documentation was performed. Wire was advanced to the level of the IVC. A 19 cm hemodialysis catheter was then placed over the wire after serial dilation of the soft tissues. Final catheter positioning was confirmed and documented with a spot radiographic image. The catheter aspirates and flushes normally. The catheter was flushed with appropriate volume heparin dwells. Dressings were applied. The patient tolerated the procedure well without immediate post procedural complication. IMPRESSION: Status post temporary hemodialysis catheter at the right IJ. Catheter can be easily converted if need be. Signed, Dulcy Fanny. Earleen Newport, DO [facility] Electronically Signed   By: Corrie Mckusick D.O.   On: 06/04/2016 17:03   Ir US Guide Vasc Access Right  Result Date: 06/04/2016 INDICATION: 65 year old female with a history of renal failure EXAM: TUNNELED CENTRAL VENOUS HEMODIALYSIS CATHETER PLACEMENT WITH ULTRASOUND AND FLUOROSCOPIC GUIDANCE MEDICATIONS: None ANESTHESIA/SEDATION: None FLUOROSCOPY TIME:  Fluoroscopy Time: 0 minutes 6 seconds (0.4 mGy). COMPLICATIONS: None PROCEDURE: Informed written consent was obtained from the patient's family after a discussion of the risks, benefits, and alternatives to treatment. Questions regarding the procedure were encouraged and answered. The right neck was prepped with chlorhexidine in a sterile fashion, and a sterile drape was applied covering the operative field. Maximum barrier sterile technique with sterile gowns and gloves were used for the procedure. A timeout was  performed prior to the initiation of the procedure. A single wall needle was used access the right internal jugular vein under direct, real-time ultrasound guidance after the overlying soft tissues were anesthetized with 1% lidocaine with epinephrine. Ultrasound image documentation was performed. Wire was advanced to the level of the IVC. A 19 cm hemodialysis catheter was then placed over the wire after serial dilation of the soft tissues. Final catheter positioning was confirmed and documented with a spot radiographic image. The catheter aspirates and flushes normally. The catheter was flushed with appropriate volume heparin dwells. Dressings were applied. The patient tolerated the procedure well without immediate post procedural complication. IMPRESSION: Status post temporary hemodialysis catheter at the right IJ. Catheter can be easily converted if need be. Signed, Dulcy Fanny. Earleen Newport, DO [facility] Electronically Signed   By:  Corrie Mckusick D.O.   On: 06/04/2016 17:03     Medications:     Scheduled Medications: . alteplase  2 mg Intracatheter Once  . aspirin EC  81 mg Oral Daily  . atorvastatin  40 mg Oral q1800  . bisacodyl  10 mg Oral Daily   Or  . bisacodyl  10 mg Rectal Daily  . docusate sodium  200 mg Oral Daily  . enoxaparin (LOVENOX) injection  30 mg Subcutaneous Q24H  . furosemide  160 mg Intravenous Q8H  . guaiFENesin  600 mg Oral BID  . insulin aspart  0-15 Units Subcutaneous TID WC  . insulin detemir  15 Units Subcutaneous QHS  . lactulose  20 g Oral Once  . metoCLOPramide (REGLAN) injection  10 mg Intravenous Q6H  . pantoprazole  40 mg Oral Daily  . warfarin  2.5 mg Oral q1800  . Warfarin - Physician Dosing Inpatient   Does not apply q1800    Infusions: . amiodarone 30 mg/hr (06/06/16 0657)  . milrinone 0.25 mcg/kg/min (06/06/16 0100)    PRN Medications: guaiFENesin-dextromethorphan, lidocaine (PF), ondansetron  (ZOFRAN) IV, oxyCODONE, promethazine, sodium chloride flush, traMADol   Assessment:   1. A/C Systolic Heart Failure  2. NSTEMI with CAD-severe 2 vessel disease LAD RCA    --s/pg CABG 12/18 with LIMA-LAD, SVG-RCA and SVG-diagonal 3. AKI on CKD 4.Uncontrolled DM- Hgb AC 11.3  5. Hyperkalemia  6. Elevated TSH 7. COPD  8. PAF on 12/20 and 12/21 - resolved with IV amio   Plan/Discussion:    Volume status continues to be elevated. Now on CVVHD. Renal plans to dialyze today and tomorrow and then follow over weekend. Still making urie so hopeful for recover. Baseline creatinine around 2.5  Co-ox improved today with volume removal. Continue milrinone.   No ACE/ARB with AKI. No b-blocker with low output. Continue ASA and statin. OK to stop plavix now that coumadin is started - stent was in 06/2015.    Back in NSR after IV amio restarted. Given several recurrences coumadin started 12/24. INR 1.87 this am. Once INR > 2.0 can stop ASA and use warfarin alone.  Holding Plavix due to possible need for tunneled HD cath.    Length of Stay: 17  Glori Bickers, MD  06/06/2016, 8:33 AM  Advanced Heart Failure Team Pager [phone] (M-F; 7a - 4p)  Please contact [facility] for night-coverage after hours (4p -7a ) and weekends on [email or web address]

## 2016-06-07 LAB — RENAL FUNCTION PANEL
Albumin: 2.1 g/dL — ABNORMAL LOW (ref 3.5–5.0)
Anion gap: 7 (ref 5–15)
BUN: 45 mg/dL — ABNORMAL HIGH (ref 6–20)
CO2: 29 mmol/L (ref 22–32)
Calcium: 7.6 mg/dL — ABNORMAL LOW (ref 8.9–10.3)
Chloride: 94 mmol/L — ABNORMAL LOW (ref 101–111)
Creatinine, Ser: 2.72 mg/dL — ABNORMAL HIGH (ref 0.44–1.00)
GFR calc Af Amer: 20 mL/min — ABNORMAL LOW
GFR calc non Af Amer: 17 mL/min — ABNORMAL LOW
Glucose, Bld: 91 mg/dL (ref 65–99)
Phosphorus: 3.6 mg/dL (ref 2.5–4.6)
Potassium: 3 mmol/L — ABNORMAL LOW (ref 3.5–5.1)
Sodium: 130 mmol/L — ABNORMAL LOW (ref 135–145)

## 2016-06-07 LAB — GLUCOSE, CAPILLARY
GLUCOSE-CAPILLARY: 298 mg/dL — AB (ref 65–99)
GLUCOSE-CAPILLARY: 209 mg/dL — AB (ref 65–99)
Glucose-Capillary: 59 mg/dL — ABNORMAL LOW (ref 65–99)
Glucose-Capillary: 84 mg/dL (ref 65–99)
Glucose-Capillary: 75 mg/dL (ref 65–99)

## 2016-06-07 LAB — COOXEMETRY PANEL
Carboxyhemoglobin: 1.4 % (ref 0.5–1.5)
Methemoglobin: 0.8 % (ref 0.0–1.5)
O2 Saturation: 56.8 %
Total hemoglobin: 9.6 g/dL — ABNORMAL LOW (ref 12.0–16.0)

## 2016-06-07 LAB — PROTIME-INR
INR: 1.89
Prothrombin Time: 22 s — ABNORMAL HIGH (ref 11.4–15.2)

## 2016-06-07 LAB — HEPATITIS B CORE ANTIBODY, TOTAL: HEP B C TOTAL AB: NEGATIVE

## 2016-06-07 MED ORDER — POTASSIUM CHLORIDE CRYS ER 20 MEQ PO TBCR
40.0000 meq | EXTENDED_RELEASE_TABLET | Freq: Once | ORAL | Status: AC
  Administered 2016-06-07: 40 meq via ORAL
  Filled 2016-06-07: qty 2

## 2016-06-07 MED ORDER — POTASSIUM CHLORIDE CRYS ER 20 MEQ PO TBCR
20.0000 meq | EXTENDED_RELEASE_TABLET | Freq: Once | ORAL | Status: AC
  Administered 2016-06-07: 20 meq via ORAL
  Filled 2016-06-07: qty 1

## 2016-06-07 MED ORDER — AMIODARONE HCL 200 MG PO TABS
400.0000 mg | ORAL_TABLET | Freq: Two times a day (BID) | ORAL | Status: DC
  Administered 2016-06-07 – 2016-06-08 (×3): 400 mg via ORAL
  Filled 2016-06-07 (×3): qty 2

## 2016-06-07 MED ORDER — INSULIN DETEMIR 100 UNIT/ML ~~LOC~~ SOLN
12.0000 [IU] | Freq: Every day | SUBCUTANEOUS | Status: DC
  Administered 2016-06-07 – 2016-06-08 (×2): 12 [IU] via SUBCUTANEOUS
  Filled 2016-06-07 (×2): qty 0.12

## 2016-06-07 MED ORDER — INSULIN ASPART 100 UNIT/ML ~~LOC~~ SOLN
6.0000 [IU] | Freq: Three times a day (TID) | SUBCUTANEOUS | Status: DC
  Administered 2016-06-07: 6 [IU] via SUBCUTANEOUS

## 2016-06-07 NOTE — Progress Notes (Addendum)
      [address]       [place] [address]             [phone]      11 Days Post-Op Procedure(s) (LRB): CORONARY ARTERY BYPASS GRAFTING (CABG) x3 using left internal mammary artery and left greater saphenous vein harvested endoscopically (N/A) TRANSESOPHAGEAL ECHOCARDIOGRAM (TEE) (N/A) Subjective: Feels okay today. She shares she would like to go home asap.   Objective: Vital signs in last 24 hours: Temp:  [97.8 F (36.6 C)-98.2 F (36.8 C)] 97.8 F (36.6 C) (12/29 0445) Pulse Rate:  [63-67] 65 (12/29 0445) Cardiac Rhythm: Normal sinus rhythm (12/28 1900) Resp:  [18-24] 18 (12/29 0445) BP: (128-153)/(55-76) 128/73 (12/29 0445) SpO2:  [90 %-95 %] 95 % (12/29 0445) Weight:  [161 lb 12.8 oz (73.4 kg)-166 lb 14.2 oz (75.7 kg)] 161 lb 12.8 oz (73.4 kg) (12/29 0445)     Intake/Output from previous day: 12/28 0701 - 12/29 0700 In: 76 [I.V.:10; IV Piggyback:66] Out: 4400 [Urine:2400] Intake/Output this shift: No intake/output data recorded.  General appearance: alert, cooperative and no distress Heart: regular rate and rhythm, S1, S2 normal, no murmur, click, rub or gallop Lungs: clear to auscultation bilaterally Abdomen: soft, non-tender; bowel sounds normal; no masses,  no organomegaly Extremities: 2+ pitting pedal edema bilaterally  Wound: clean and dry  Lab Results:  Recent Labs  06/04/16 0834  WBC 11.6*  HGB 9.5*  HCT 28.1*  PLT 532*   BMET:  Recent Labs  06/06/16 0408 06/07/16 0423  NA 128* 130*  K 3.9 3.0*  CL 93* 94*  CO2 25 29  GLUCOSE 164* 91  BUN 81* 45*  CREATININE 3.95* 2.72*  CALCIUM 7.6* 7.6*    PT/INR:  Recent Labs  06/07/16 0423  LABPROT 22.0*  INR 1.89   ABG    Component Value Date/Time   PHART 7.425 05/28/2016 0512   HCO3 25.9 05/28/2016 0512   TCO2 28 05/29/2016 1703   ACIDBASEDEF 1.0 05/27/2016 1417   O2SAT 56.8 06/07/2016 0445   CBG (last 3)   Recent Labs  06/06/16 2117 06/07/16 0637  06/07/16 0657  GLUCAP 215* 59* 84    Assessment/Plan: S/P Procedure(s) (LRB): CORONARY ARTERY BYPASS GRAFTING (CABG) x3 using left internal mammary artery and left greater saphenous vein harvested endoscopically (N/A) TRANSESOPHAGEAL ECHOCARDIOGRAM (TEE) (N/A)  1. CV-NSR in the 60s. EPW removed. Remains on milrinone for inotropic support. coox is 56.8, which is improved. NSR in the 60s. Remains on PO and IV Amio.  2. Pulm-tolerating room air without issue 3. Renal-creatinine 2.72, hypokalemic. Will refer to nephrology for replacement. HD per renal 4. Endo-generally hyperglycemic at night and hypoglycemic in the morning. Will decrease Levemir at night to 12 units. May benefit from daytime long-acting coverage.  5. Volume overload-Lasix drip, heart failure following  Plan: Continue telemetry unit care. Weaning drips per heart failure and renal. Decreased Levemir due to hypoglycemia this morning. Holding HD today. Continue TID ambulation.     LOS: 18 days    Elgie Collard 06/07/2016    Chart reviewed, patient examined, agree with above. She is diuresing well with lasix and creat is dropping.  Glucose 298 after breakfast. Will add meal coverage to SSI and bedtime Levemir.

## 2016-06-07 NOTE — Progress Notes (Signed)
CARDIAC REHAB PHASE I   PRE:  Rate/Rhythm: 56 SR                    Up at door MODE:  Ambulation: 150 ft   POST:  Rate/Rhythm: 81 SR  BP:  Supine:   Sitting: 122/54  Standing:    SaO2: 88-90%RA  94% sitting on bed [phone] Pt up at door ready to walk. Pt walked 150 ft on RA pushing IV pole with steady gait. Did not want to walk farther as she had low sugar this morning prior to breakfast. Encouraged her to walk farther as she feels better. Discussed CRP 2 and will refer to [facility]. Sats a little low but no SOB per pt.  Sats better as she sat and rested. Encouraged IS.   Graylon Good, RN BSN  06/07/2016 9:50 AM

## 2016-06-07 NOTE — Progress Notes (Signed)
Patient ID: Theresa Rogers, female   DOB: [birth year], 65 y.o.   MRN: [id number]    Advanced Heart Failure Rounding Note   Subjective:    Transferred from [facility] with volume overload despite IV diuresis + milrinone.   Now s/p CABG x 3 on 05/27/16  Milrinone and IV lasix restarted 12/23, required initiation of CVVH.  He remains on milrinone 0.25.  Co-ox 57% today.  Good UOP yesterday, 2400 cc in addition to 2000 cc off via HD (had HD#2 on 12/28).   No atrial fibrillation, remains in NSR.  Still feels swollen/tight but breathing improving.   Objective:   Weight Range:  Vital Signs:   Temp:  [97.8 F (36.6 C)-98.2 F (36.8 C)] 97.8 F (36.6 C) (12/29 0445) Pulse Rate:  [63-67] 65 (12/29 0445) Resp:  [18-24] 18 (12/29 0445) BP: (128-153)/(55-76) 128/73 (12/29 0445) SpO2:  [90 %-95 %] 95 % (12/29 0445) Weight:  [161 lb 12.8 oz (73.4 kg)-166 lb 14.2 oz (75.7 kg)] 161 lb 12.8 oz (73.4 kg) (12/29 0445) Last BM Date: 06/05/16  Weight change: Filed Weights   06/06/16 0918 06/06/16 1228 06/07/16 0445  Weight: 166 lb 14.2 oz (75.7 kg) 161 lb 13.1 oz (73.4 kg) 161 lb 12.8 oz (73.4 kg)    Intake/Output:   Intake/Output Summary (Last 24 hours) at 06/07/16 0742 Last data filed at 06/07/16 0447  Gross per 24 hour  Intake               76 ml  Output             4100 ml  Net            -4024 ml     Physical Exam: General:  Sitting in bed. NAD HEENT: Normal Neck: supple. JVP remains elevated to jaw. Carotids 2+ bilat; no bruits. No thyromegaly or nodule noted.  Cor: sternal dressing RRR Lungs: Clear but diminished basilar sounds.  Abdomen: soft, NT, ND, no HSM. No bruits or masses. +BS  Extremities: no cyanosis, clubbing, rash, 2+ edema up into thighs.  Neuro: alert & orientedx3, cranial nerves grossly intact. moves all 4 extremities w/o difficulty. Affect pleasant  Telemetry: Reviewed, NSR 60-70s    Labs: Basic Metabolic Panel:  Recent Labs Lab 06/03/16 0408 06/04/16 0834  06/05/16 0801 06/06/16 0408 06/07/16 0423  NA 128* 128* 127* 128* 130*  K 5.2* 4.9 4.8 3.9 3.0*  CL 95* 94* 92* 93* 94*  CO2 22 22 24 25 29   GLUCOSE 313* 124* 136* 164* 91  BUN 97* 111* 118* 81* 45*  CREATININE 3.81* 4.68* 5.16* 3.95* 2.72*  CALCIUM 8.0* 8.3* 8.3* 7.6* 7.6*  PHOS  --   --  5.7* 4.7* 3.6    Liver Function Tests:  Recent Labs Lab 06/04/16 0834 06/05/16 0801 06/05/16 1428 06/06/16 0408 06/07/16 0423  AST 31  --   --   --   --   ALT 36  --  34  --   --   ALKPHOS 373*  --   --   --   --   BILITOT 0.5  --   --   --   --   PROT 5.9*  --   --   --   --   ALBUMIN 2.6* 2.5*  --  2.2* 2.1*   No results for input(s): LIPASE, AMYLASE in the last 168 hours. No results for input(s): AMMONIA in the last 168 hours.  CBC:  Recent Labs Lab 06/01/16 0412 06/02/16 0425  06/03/16 0408 06/04/16 0834  WBC 14.9* 14.2* 12.2* 11.6*  HGB 9.2* 7.8* 8.7* 9.5*  HCT 27.6* 23.8* 26.2* 28.1*  MCV 80.9 81.0 79.6 79.2  PLT 437* 429* 454* 532*    Cardiac Enzymes: No results for input(s): CKTOTAL, CKMB, CKMBINDEX, TROPONINI in the last 168 hours.  BNP: BNP (last 3 results)  Recent Labs  06/19/15 0923 05/13/16 0905 06/04/16 0834  BNP 1,782.0* 2,790.0* 3,366.3*    ProBNP (last 3 results) No results for input(s): PROBNP in the last 8760 hours.    Other results:  Imaging: No results found.   Medications:     Scheduled Medications: . alteplase  2 mg Intracatheter Once  . amiodarone  400 mg Oral BID  . aspirin EC  81 mg Oral Daily  . atorvastatin  40 mg Oral q1800  . bisacodyl  10 mg Oral Daily   Or  . bisacodyl  10 mg Rectal Daily  . docusate sodium  200 mg Oral Daily  . enoxaparin (LOVENOX) injection  30 mg Subcutaneous Q24H  . furosemide  160 mg Intravenous Q8H  . guaiFENesin  600 mg Oral BID  . insulin aspart  0-15 Units Subcutaneous TID WC  . insulin detemir  15 Units Subcutaneous QHS  . lactulose  20 g Oral Once  . metoCLOPramide (REGLAN)  injection  5 mg Intravenous Q6H  . pantoprazole  40 mg Oral Daily  . potassium chloride  20 mEq Oral Once  . potassium chloride  40 mEq Oral Once  . warfarin  2.5 mg Oral q1800  . Warfarin - Physician Dosing Inpatient   Does not apply q1800    Infusions: . milrinone 0.25 mcg/kg/min (06/06/16 1442)    PRN Medications: guaiFENesin-dextromethorphan, lidocaine (PF), ondansetron (ZOFRAN) IV, oxyCODONE, promethazine, sodium chloride flush, traMADol   Assessment:   1. A/C Systolic Heart Failure: Ischemic cardiomyopathy, EF 20% with mild LV dilation on 12/17 echo.  2. NSTEMI with CAD-severe 2 vessel disease LAD RCA    - s/p CABG 12/18 with LIMA-LAD, SVG-RCA and SVG-diagonal 3. AKI on CKD: Required CVVH then HD.  4. Uncontrolled DM- Hgb AC 11.3  5. Hyperkalemia  6. Elevated TSH 7. COPD  8. PAF: resolved with IV amio   Plan/Discussion:    Volume status continues to be elevated on exam.  She was on CVVH, now has had 2 sessions of HD.  She had good UOP yesterday with Lasix 160 mg IV every 8 hrs.  Weight is down.  - Continue IV Lasix today. Will gently replace K.  - Discussed with Dr Mercy Moore, no HD today.   Co-ox 57%, continue milrinone 0.25.   No ACE/ARB with AKI. No b-blocker with low output. Continue ASA and statin.   Remains in NSR. Given several recurrences of atrial fibrillation, coumadin started 12/24. INR 1.89 this am.  - Once INR > 2.0 can stop ASA and use warfarin alone.  - Can stop IV amiodarone this am and start amiodarone 400 mg bid.   Length of Stay: 35  Loralie Champagne, MD  06/07/2016, 7:42 AM  Advanced Heart Failure Team Pager [phone] (M-F; 7a - 4p)  Please contact [facility] for night-coverage after hours (4p -7a ) and weekends on [email or web address]

## 2016-06-07 NOTE — Progress Notes (Signed)
S: Did well with HD yest.  Pulled 2L O:BP 128/73 (BP Location: Left Arm)   Pulse 65   Temp 97.8 F (36.6 C) (Oral)   Resp 18   Ht 5\' 4"  (1.626 m)   Wt 73.4 kg (161 lb 12.8 oz)   SpO2 95%   BMI 27.77 kg/m   Intake/Output Summary (Last 24 hours) at 06/07/16 0704 Last data filed at 06/07/16 0447  Gross per 24 hour  Intake               76 ml  Output             4400 ml  Net            -4324 ml   Weight change: -0.7 kg (-1 lb 8.7 oz) IDP:OEUMP and alert CVS:RRR Resp:decreased BS bases Abd:+ BS NTND Ext:2-3+ edema NEURO:CNI Ox3, no asterixis Rt IJ Temp HD cath   . alteplase  2 mg Intracatheter Once  . aspirin EC  81 mg Oral Daily  . atorvastatin  40 mg Oral q1800  . bisacodyl  10 mg Oral Daily   Or  . bisacodyl  10 mg Rectal Daily  . docusate sodium  200 mg Oral Daily  . enoxaparin (LOVENOX) injection  30 mg Subcutaneous Q24H  . furosemide  160 mg Intravenous Q8H  . guaiFENesin  600 mg Oral BID  . insulin aspart  0-15 Units Subcutaneous TID WC  . insulin detemir  15 Units Subcutaneous QHS  . lactulose  20 g Oral Once  . metoCLOPramide (REGLAN) injection  5 mg Intravenous Q6H  . pantoprazole  40 mg Oral Daily  . warfarin  2.5 mg Oral q1800  . Warfarin - Physician Dosing Inpatient   Does not apply q1800   No results found. BMET    Component Value Date/Time   NA 130 (L) 06/07/2016 0423   K 3.0 (L) 06/07/2016 0423   CL 94 (L) 06/07/2016 0423   CO2 29 06/07/2016 0423   GLUCOSE 91 06/07/2016 0423   BUN 45 (H) 06/07/2016 0423   CREATININE 2.72 (H) 06/07/2016 0423   CALCIUM 7.6 (L) 06/07/2016 0423   GFRNONAA 17 (L) 06/07/2016 0423   GFRAA 20 (L) 06/07/2016 0423   CBC    Component Value Date/Time   WBC 11.6 (H) 06/04/2016 0834   RBC 3.55 (L) 06/04/2016 0834   HGB 9.5 (L) 06/04/2016 0834   HCT 28.1 (L) 06/04/2016 0834   PLT 532 (H) 06/04/2016 0834   MCV 79.2 06/04/2016 0834   MCH 26.8 06/04/2016 0834   MCHC 33.8 06/04/2016 0834   RDW 15.9 (H) 06/04/2016 0834    LYMPHSABS 0.6 (L) 04/29/2016 1031   MONOABS 0.7 04/29/2016 1031   EOSABS 0.1 04/29/2016 1031   BASOSABS 0.1 04/29/2016 1031     Assessment:  1. Acute on CKD 3 sec ischemic ATN post CABG.  2. SP Cath and CABG 3. DM 4. Ischemic cardiomyopathy  EF 20-25%  Plan: 1. She does not want to do HD today but will tomorrow if Scr higher.  UO great 2. Daily labs 3. Replace K Theresa Rogers T

## 2016-06-08 LAB — CBC
HCT: 27 % — ABNORMAL LOW (ref 36.0–46.0)
HEMOGLOBIN: 9.1 g/dL — AB (ref 12.0–15.0)
MCH: 26.7 pg (ref 26.0–34.0)
MCHC: 33.7 g/dL (ref 30.0–36.0)
MCV: 79.2 fL (ref 78.0–100.0)
PLATELETS: 443 10*3/uL — AB (ref 150–400)
RBC: 3.41 MIL/uL — AB (ref 3.87–5.11)
RDW: 16.6 % — ABNORMAL HIGH (ref 11.5–15.5)
WBC: 13.3 10*3/uL — AB (ref 4.0–10.5)

## 2016-06-08 LAB — BASIC METABOLIC PANEL
ANION GAP: 10 (ref 5–15)
BUN: 51 mg/dL — ABNORMAL HIGH (ref 6–20)
CALCIUM: 7.8 mg/dL — AB (ref 8.9–10.3)
CHLORIDE: 93 mmol/L — AB (ref 101–111)
CO2: 28 mmol/L (ref 22–32)
CREATININE: 2.85 mg/dL — AB (ref 0.44–1.00)
GFR calc non Af Amer: 16 mL/min — ABNORMAL LOW (ref >60)
GFR, EST AFRICAN AMERICAN: 19 mL/min — AB (ref >60)
Glucose, Bld: 122 mg/dL — ABNORMAL HIGH (ref 65–99)
Potassium: 3.4 mmol/L — ABNORMAL LOW (ref 3.5–5.1)
SODIUM: 131 mmol/L — AB (ref 135–145)

## 2016-06-08 LAB — RENAL FUNCTION PANEL
Albumin: 2.3 g/dL — ABNORMAL LOW (ref 3.5–5.0)
Anion gap: 12 (ref 5–15)
BUN: 51 mg/dL — ABNORMAL HIGH (ref 6–20)
CHLORIDE: 94 mmol/L — AB (ref 101–111)
CO2: 26 mmol/L (ref 22–32)
CREATININE: 2.81 mg/dL — AB (ref 0.44–1.00)
Calcium: 7.9 mg/dL — ABNORMAL LOW (ref 8.9–10.3)
GFR calc Af Amer: 19 mL/min — ABNORMAL LOW (ref >60)
GFR calc non Af Amer: 17 mL/min — ABNORMAL LOW (ref >60)
GLUCOSE: 120 mg/dL — AB (ref 65–99)
Phosphorus: 3.5 mg/dL (ref 2.5–4.6)
Potassium: 3.4 mmol/L — ABNORMAL LOW (ref 3.5–5.1)
Sodium: 132 mmol/L — ABNORMAL LOW (ref 135–145)

## 2016-06-08 LAB — PROTIME-INR
INR: 2.09
PROTHROMBIN TIME: 23.8 s — AB (ref 11.4–15.2)

## 2016-06-08 LAB — COOXEMETRY PANEL
CARBOXYHEMOGLOBIN: 0.9 % (ref 0.5–1.5)
CARBOXYHEMOGLOBIN: 1 % (ref 0.5–1.5)
METHEMOGLOBIN: 1.1 % (ref 0.0–1.5)
Methemoglobin: 1.1 % (ref 0.0–1.5)
O2 SAT: 56.5 %
O2 Saturation: 47.7 %
TOTAL HEMOGLOBIN: 10 g/dL — AB (ref 12.0–16.0)
Total hemoglobin: 9.4 g/dL — ABNORMAL LOW (ref 12.0–16.0)

## 2016-06-08 LAB — GLUCOSE, CAPILLARY
Glucose-Capillary: 108 mg/dL — ABNORMAL HIGH (ref 65–99)
Glucose-Capillary: 262 mg/dL — ABNORMAL HIGH (ref 65–99)
Glucose-Capillary: 246 mg/dL — ABNORMAL HIGH (ref 65–99)
Glucose-Capillary: 294 mg/dL — ABNORMAL HIGH (ref 65–99)

## 2016-06-08 MED ORDER — AMIODARONE HCL IN DEXTROSE 360-4.14 MG/200ML-% IV SOLN
30.0000 mg/h | INTRAVENOUS | Status: DC
  Administered 2016-06-08 – 2016-06-11 (×5): 30 mg/h via INTRAVENOUS
  Filled 2016-06-08 (×6): qty 200

## 2016-06-08 MED ORDER — AMIODARONE HCL IN DEXTROSE 360-4.14 MG/200ML-% IV SOLN
60.0000 mg/h | INTRAVENOUS | Status: AC
  Administered 2016-06-08: 60 mg/h via INTRAVENOUS
  Filled 2016-06-08: qty 200

## 2016-06-08 MED ORDER — POTASSIUM CHLORIDE CRYS ER 20 MEQ PO TBCR
20.0000 meq | EXTENDED_RELEASE_TABLET | Freq: Two times a day (BID) | ORAL | Status: AC
  Administered 2016-06-08 (×2): 20 meq via ORAL
  Filled 2016-06-08 (×2): qty 1

## 2016-06-08 NOTE — Progress Notes (Signed)
Patient ID: Theresa Rogers, female   DOB: 1950-12-12, 65 y.o.   MRN: [id number]    Advanced Heart Failure Rounding Note   Subjective:    Transferred from [facility] with volume overload despite IV diuresis + milrinone.   Now s/p CABG x 3 on 05/27/16  Milrinone and IV lasix restarted 12/23, required initiation of CVVH then HD.  She remains on milrinone 0.25.  Co-ox lower at 48% when done this morning.  Good UOP again yesterday, last HD was 12/28.   She is back in atrial fibrillation with rate in 100s today.  Still feels swollen/tight but breathing improving.   Objective:   Weight Range:  Vital Signs:   Temp:  [97.6 F (36.4 C)-97.7 F (36.5 C)] 97.6 F (36.4 C) (12/30 0553) Pulse Rate:  [64-66] 65 (12/30 0553) Resp:  [18-19] 18 (12/30 0553) BP: (145-148)/(68-77) 148/73 (12/30 0553) SpO2:  [96 %-98 %] 96 % (12/30 0553) Weight:  [160 lb 8 oz (72.8 kg)-161 lb 11 oz (73.3 kg)] 160 lb 8 oz (72.8 kg) (12/30 0543) Last BM Date: 06/05/16  Weight change: Filed Weights   06/07/16 0445 06/08/16 0339 06/08/16 0543  Weight: 161 lb 12.8 oz (73.4 kg) 161 lb 11 oz (73.3 kg) 160 lb 8 oz (72.8 kg)    Intake/Output:   Intake/Output Summary (Last 24 hours) at 06/08/16 0954 Last data filed at 06/08/16 0551  Gross per 24 hour  Intake               10 ml  Output             2400 ml  Net            -2390 ml     Physical Exam: General:  Sitting in bed. NAD HEENT: Normal Neck: supple. JVP 12 cm. Carotids 2+ bilat; no bruits. No thyromegaly or nodule noted.  Cor: sternal dressing irregular S1S2, mildly tachy.  Lungs: Clear but diminished basilar sounds.  Abdomen: soft, NT, ND, no HSM. No bruits or masses. +BS  Extremities: no cyanosis, clubbing, rash, 2+ edema up to knees.  Neuro: alert & orientedx3, cranial nerves grossly intact. moves all 4 extremities w/o difficulty. Affect pleasant  Telemetry: Reviewed, atrial fibrillation in 100s    Labs: Basic Metabolic Panel:  Recent Labs Lab  06/04/16 0834 06/05/16 0801 06/06/16 0408 06/07/16 0423 06/08/16 0516  NA 128* 127* 128* 130* 131*  132*  K 4.9 4.8 3.9 3.0* 3.4*  3.4*  CL 94* 92* 93* 94* 93*  94*  CO2 22 24 25 29 28  26   GLUCOSE 124* 136* 164* 91 122*  120*  BUN 111* 118* 81* 45* 51*  51*  CREATININE 4.68* 5.16* 3.95* 2.72* 2.85*  2.81*  CALCIUM 8.3* 8.3* 7.6* 7.6* 7.8*  7.9*  PHOS  --  5.7* 4.7* 3.6 3.5    Liver Function Tests:  Recent Labs Lab 06/04/16 0834 06/05/16 0801 06/05/16 1428 06/06/16 0408 06/07/16 0423 06/08/16 0516  AST 31  --   --   --   --   --   ALT 36  --  34  --   --   --   ALKPHOS 373*  --   --   --   --   --   BILITOT 0.5  --   --   --   --   --   PROT 5.9*  --   --   --   --   --   ALBUMIN 2.6* 2.5*  --  2.2* 2.1* 2.3*   No results for input(s): LIPASE, AMYLASE in the last 168 hours. No results for input(s): AMMONIA in the last 168 hours.  CBC:  Recent Labs Lab 06/02/16 0425 06/03/16 0408 06/04/16 0834 06/08/16 0516  WBC 14.2* 12.2* 11.6* 13.3*  HGB 7.8* 8.7* 9.5* 9.1*  HCT 23.8* 26.2* 28.1* 27.0*  MCV 81.0 79.6 79.2 79.2  PLT 429* 454* 532* 443*    Cardiac Enzymes: No results for input(s): CKTOTAL, CKMB, CKMBINDEX, TROPONINI in the last 168 hours.  BNP: BNP (last 3 results)  Recent Labs  06/19/15 0923 05/13/16 0905 06/04/16 0834  BNP 1,782.0* 2,790.0* 3,366.3*    ProBNP (last 3 results) No results for input(s): PROBNP in the last 8760 hours.    Other results:  Imaging: No results found.   Medications:     Scheduled Medications: . alteplase  2 mg Intracatheter Once  . atorvastatin  40 mg Oral q1800  . bisacodyl  10 mg Oral Daily   Or  . bisacodyl  10 mg Rectal Daily  . docusate sodium  200 mg Oral Daily  . furosemide  160 mg Intravenous Q8H  . guaiFENesin  600 mg Oral BID  . insulin aspart  0-15 Units Subcutaneous TID WC  . insulin aspart  6 Units Subcutaneous TID WC  . insulin detemir  12 Units Subcutaneous QHS  . lactulose   20 g Oral Once  . metoCLOPramide (REGLAN) injection  5 mg Intravenous Q6H  . pantoprazole  40 mg Oral Daily  . potassium chloride  20 mEq Oral BID  . warfarin  2.5 mg Oral q1800  . Warfarin - Physician Dosing Inpatient   Does not apply q1800    Infusions: . amiodarone    . amiodarone    . milrinone 0.25 mcg/kg/min (06/08/16 0842)    PRN Medications: guaiFENesin-dextromethorphan, lidocaine (PF), ondansetron (ZOFRAN) IV, oxyCODONE, promethazine, sodium chloride flush, traMADol   Assessment:   1. A/C Systolic Heart Failure: Ischemic cardiomyopathy, EF 20% with mild LV dilation on 12/17 echo.  2. NSTEMI with CAD-severe 2 vessel disease LAD RCA    - s/p CABG 12/18 with LIMA-LAD, SVG-RCA and SVG-diagonal 3. AKI on CKD: Required CVVH then HD.  4. Uncontrolled DM- Hgb AC 11.3  5. Hyperkalemia  6. Elevated TSH 7. COPD  8. PAF: on amiodarone.    Plan/Discussion:    Volume status continues to be elevated on exam.  She was on CVVH, now has had 2 sessions of HD but none since 12/28.  She had good UOP yesterday with Lasix 160 mg IV every 8 hrs.  Weight is down 1 lb.  - Continue IV Lasix today. Will gently replace K.  - Continue to follow for HD need.  Creatinine appears to have plateaued and UOP is good, hopefully can avoid.   Co-ox 48% early this am on milrinone 0.25. Was better yesterday.  Will resend this morning now that she is up and moving around.   No ACE/ARB with AKI. No b-blocker with low output. Continue statin.  Can stop ASA today with therapeutic INR.    Back in atrial fibrillation this morning, coumadin started 12/24. INR 2.09  - Stop ASA and off heparin gtt.  - Back to amiodarone IV this morning with recurrent atrial fibrillation.  Need to load more amiodarone. Would likely help to come off milrinone, but with low co-ox we do not appear to be at a point where we can safely do that.   Length of Stay: 40  Loralie Champagne, MD  06/08/2016, 9:54 AM  Advanced Heart Failure  Team Pager [phone] (M-F; 7a - 4p)  Please contact [facility] for night-coverage after hours (4p -7a ) and weekends on [email or web address]

## 2016-06-08 NOTE — Progress Notes (Signed)
S: No new CO except wants to go home.  Told her she is not quite ready yet O:BP (!) 148/73 (BP Location: Right Arm)   Pulse 65   Temp 97.6 F (36.4 C) (Oral)   Resp 18   Ht 5\' 4"  (1.626 m)   Wt 72.8 kg (160 lb 8 oz)   SpO2 96%   BMI 27.55 kg/m   Intake/Output Summary (Last 24 hours) at 06/08/16 0647 Last data filed at 06/08/16 0551  Gross per 24 hour  Intake               10 ml  Output             2400 ml  Net            -2390 ml   Weight change: -2.358 kg (-5 lb 3.2 oz) FOY:DXAJO and alert CVS:RRR Resp:decreased BS bases Abd:+ BS NTND Ext:2-3+ edema NEURO:CNI Ox3, no asterixis Rt IJ Temp HD cath   . alteplase  2 mg Intracatheter Once  . amiodarone  400 mg Oral BID  . aspirin EC  81 mg Oral Daily  . atorvastatin  40 mg Oral q1800  . bisacodyl  10 mg Oral Daily   Or  . bisacodyl  10 mg Rectal Daily  . docusate sodium  200 mg Oral Daily  . enoxaparin (LOVENOX) injection  30 mg Subcutaneous Q24H  . furosemide  160 mg Intravenous Q8H  . guaiFENesin  600 mg Oral BID  . insulin aspart  0-15 Units Subcutaneous TID WC  . insulin aspart  6 Units Subcutaneous TID WC  . insulin detemir  12 Units Subcutaneous QHS  . lactulose  20 g Oral Once  . metoCLOPramide (REGLAN) injection  5 mg Intravenous Q6H  . pantoprazole  40 mg Oral Daily  . warfarin  2.5 mg Oral q1800  . Warfarin - Physician Dosing Inpatient   Does not apply q1800   No results found. BMET    Component Value Date/Time   NA 131 (L) 06/08/2016 0516   NA 132 (L) 06/08/2016 0516   K 3.4 (L) 06/08/2016 0516   K 3.4 (L) 06/08/2016 0516   CL 93 (L) 06/08/2016 0516   CL 94 (L) 06/08/2016 0516   CO2 28 06/08/2016 0516   CO2 26 06/08/2016 0516   GLUCOSE 122 (H) 06/08/2016 0516   GLUCOSE 120 (H) 06/08/2016 0516   BUN 51 (H) 06/08/2016 0516   BUN 51 (H) 06/08/2016 0516   CREATININE 2.85 (H) 06/08/2016 0516   CREATININE 2.81 (H) 06/08/2016 0516   CALCIUM 7.8 (L) 06/08/2016 0516   CALCIUM 7.9 (L) 06/08/2016 0516    GFRNONAA 16 (L) 06/08/2016 0516   GFRNONAA 17 (L) 06/08/2016 0516   GFRAA 19 (L) 06/08/2016 0516   GFRAA 19 (L) 06/08/2016 0516   CBC    Component Value Date/Time   WBC 13.3 (H) 06/08/2016 0516   RBC 3.41 (L) 06/08/2016 0516   HGB 9.1 (L) 06/08/2016 0516   HCT 27.0 (L) 06/08/2016 0516   PLT 443 (H) 06/08/2016 0516   MCV 79.2 06/08/2016 0516   MCH 26.7 06/08/2016 0516   MCHC 33.7 06/08/2016 0516   RDW 16.6 (H) 06/08/2016 0516   LYMPHSABS 0.6 (L) 04/29/2016 1031   MONOABS 0.7 04/29/2016 1031   EOSABS 0.1 04/29/2016 1031   BASOSABS 0.1 04/29/2016 1031     Assessment:  1. Acute on CKD 3 sec ischemic ATN post CABG.  UO good, Scr fairly stable 2. SP  Cath and CABG 3. DM 4. Ischemic cardiomyopathy  EF 20-25% on milrinone gtt  Plan: 1. Will hold off on HD as Scr stable and UO good.  Told her we are not out of the woods yet on the need for more HD but stable Scr is encouraging 2. Daily labs 3. Replace K Gaberial Cada T

## 2016-06-08 NOTE — Progress Notes (Addendum)
[address]       [place] [address]             [phone]      12 Days Post-Op Procedure(s) (LRB): CORONARY ARTERY BYPASS GRAFTING (CABG) x3 using left internal mammary artery and left greater saphenous vein harvested endoscopically (N/A) TRANSESOPHAGEAL ECHOCARDIOGRAM (TEE) (N/A) Subjective: Feeling pretty well, frustrated being here for so long  Objective: Vital signs in last 24 hours: Temp:  [97.6 F (36.4 C)-97.7 F (36.5 C)] 97.6 F (36.4 C) (12/30 0553) Pulse Rate:  [64-66] 65 (12/30 0553) Cardiac Rhythm: Normal sinus rhythm (12/29 1900) Resp:  [18-19] 18 (12/30 0553) BP: (145-148)/(68-77) 148/73 (12/30 0553) SpO2:  [96 %-98 %] 96 % (12/30 0553) Weight:  [160 lb 8 oz (72.8 kg)-161 lb 11 oz (73.3 kg)] 160 lb 8 oz (72.8 kg) (12/30 0543)  Hemodynamic parameters for last 24 hours:    Intake/Output from previous day: 12/29 0701 - 12/30 0700 In: 10 [I.V.:10] Out: 2400 [Urine:2400] Intake/Output this shift: No intake/output data recorded.  General appearance: alert, cooperative and no distress Heart: regular rate and rhythm Lungs: mildly dim in bases Abdomen: benign Extremities: + pitting edema Wound: incis healing well  Lab Results:  Recent Labs  06/08/16 0516  WBC 13.3*  HGB 9.1*  HCT 27.0*  PLT 443*   BMET:  Recent Labs  06/07/16 0423 06/08/16 0516  NA 130* 131*  132*  K 3.0* 3.4*  3.4*  CL 94* 93*  94*  CO2 29 28  26   GLUCOSE 91 122*  120*  BUN 45* 51*  51*  CREATININE 2.72* 2.85*  2.81*  CALCIUM 7.6* 7.8*  7.9*    PT/INR:  Recent Labs  06/08/16 0516  LABPROT 23.8*  INR 2.09   ABG    Component Value Date/Time   PHART 7.425 05/28/2016 0512   HCO3 25.9 05/28/2016 0512   TCO2 28 05/29/2016 1703   ACIDBASEDEF 1.0 05/27/2016 1417   O2SAT 47.7 06/08/2016 0530   CBG (last 3)   Recent Labs  06/07/16 1616 06/07/16 2102 06/08/16 0613  GLUCAP 209* 75 108*    Meds Scheduled Meds: . alteplase  2  mg Intracatheter Once  . amiodarone  400 mg Oral BID  . aspirin EC  81 mg Oral Daily  . atorvastatin  40 mg Oral q1800  . bisacodyl  10 mg Oral Daily   Or  . bisacodyl  10 mg Rectal Daily  . docusate sodium  200 mg Oral Daily  . enoxaparin (LOVENOX) injection  30 mg Subcutaneous Q24H  . furosemide  160 mg Intravenous Q8H  . guaiFENesin  600 mg Oral BID  . insulin aspart  0-15 Units Subcutaneous TID WC  . insulin aspart  6 Units Subcutaneous TID WC  . insulin detemir  12 Units Subcutaneous QHS  . lactulose  20 g Oral Once  . metoCLOPramide (REGLAN) injection  5 mg Intravenous Q6H  . pantoprazole  40 mg Oral Daily  . potassium chloride  20 mEq Oral BID  . warfarin  2.5 mg Oral q1800  . Warfarin - Physician Dosing Inpatient   Does not apply q1800   Continuous Infusions: . milrinone 0.25 mcg/kg/min (06/07/16 1023)   PRN Meds:.guaiFENesin-dextromethorphan, lidocaine (PF), ondansetron (ZOFRAN) IV, oxyCODONE, promethazine, sodium chloride flush, traMADol  Xrays No results found.  Assessment/Plan: S/P Procedure(s) (LRB): CORONARY ARTERY BYPASS GRAFTING (CABG) x3 using left internal mammary artery and left greater saphenous vein harvested endoscopically (N/A) TRANSESOPHAGEAL  ECHOCARDIOGRAM (TEE) (N/A)  1 clinically stable on current RX, CoOX today 47 on milrinone. Making good UO on lasix gtt. Creat is stable 2 nephrology and AHF team managing their domains 3 sugars controlled 4 rhythm currently sinus but had afib yest, PAC's and pvc's- cont amio po 5 potassium replacement- nephrology is dosing 6 cont current coumadin dose  LOS: 19 days    GOLD,WAYNE E 06/08/2016 In SR presently Feels better this afternoon than she did earlier today  Esto. Roxan Hockey, MD [facility] [phone]

## 2016-06-09 LAB — CBC
HCT: 27.8 % — ABNORMAL LOW (ref 36.0–46.0)
Hemoglobin: 9.1 g/dL — ABNORMAL LOW (ref 12.0–15.0)
MCH: 25.9 pg — AB (ref 26.0–34.0)
MCHC: 32.7 g/dL (ref 30.0–36.0)
MCV: 79.2 fL (ref 78.0–100.0)
PLATELETS: 444 10*3/uL — AB (ref 150–400)
RBC: 3.51 MIL/uL — AB (ref 3.87–5.11)
RDW: 16.5 % — ABNORMAL HIGH (ref 11.5–15.5)
WBC: 14.5 10*3/uL — ABNORMAL HIGH (ref 4.0–10.5)

## 2016-06-09 LAB — URINALYSIS, ROUTINE W REFLEX MICROSCOPIC
BILIRUBIN URINE: NEGATIVE
Glucose, UA: >=500 mg/dL — AB
KETONES UR: NEGATIVE mg/dL
LEUKOCYTES UA: NEGATIVE
Nitrite: NEGATIVE
PROTEIN: NEGATIVE mg/dL
Specific Gravity, Urine: 1.006 (ref 1.005–1.030)
pH: 5 (ref 5.0–8.0)

## 2016-06-09 LAB — PROTIME-INR
INR: 2.23
PROTHROMBIN TIME: 25.1 s — AB (ref 11.4–15.2)

## 2016-06-09 LAB — COOXEMETRY PANEL
Carboxyhemoglobin: 1 % (ref 0.5–1.5)
Methemoglobin: 1 % (ref 0.0–1.5)
O2 Saturation: 50.5 %
Total hemoglobin: 9.2 g/dL — ABNORMAL LOW (ref 12.0–16.0)

## 2016-06-09 LAB — RENAL FUNCTION PANEL
Albumin: 2.4 g/dL — ABNORMAL LOW (ref 3.5–5.0)
Anion gap: 8 (ref 5–15)
BUN: 60 mg/dL — AB (ref 6–20)
CHLORIDE: 90 mmol/L — AB (ref 101–111)
CO2: 31 mmol/L (ref 22–32)
CREATININE: 2.92 mg/dL — AB (ref 0.44–1.00)
Calcium: 8.2 mg/dL — ABNORMAL LOW (ref 8.9–10.3)
GFR calc non Af Amer: 16 mL/min — ABNORMAL LOW (ref >60)
GFR, EST AFRICAN AMERICAN: 18 mL/min — AB (ref >60)
Glucose, Bld: 183 mg/dL — ABNORMAL HIGH (ref 65–99)
Phosphorus: 3.6 mg/dL (ref 2.5–4.6)
Potassium: 3.8 mmol/L (ref 3.5–5.1)
Sodium: 129 mmol/L — ABNORMAL LOW (ref 135–145)

## 2016-06-09 LAB — GLUCOSE, CAPILLARY
GLUCOSE-CAPILLARY: 148 mg/dL — AB (ref 65–99)
GLUCOSE-CAPILLARY: 341 mg/dL — AB (ref 65–99)
Glucose-Capillary: 187 mg/dL — ABNORMAL HIGH (ref 65–99)
Glucose-Capillary: 378 mg/dL — ABNORMAL HIGH (ref 65–99)

## 2016-06-09 MED ORDER — INSULIN ASPART 100 UNIT/ML ~~LOC~~ SOLN
12.0000 [IU] | Freq: Three times a day (TID) | SUBCUTANEOUS | Status: DC
  Administered 2016-06-09: 8 [IU] via SUBCUTANEOUS
  Administered 2016-06-10: 6 [IU] via SUBCUTANEOUS

## 2016-06-09 MED ORDER — INSULIN DETEMIR 100 UNIT/ML ~~LOC~~ SOLN
15.0000 [IU] | Freq: Every day | SUBCUTANEOUS | Status: DC
  Administered 2016-06-09: 15 [IU] via SUBCUTANEOUS
  Filled 2016-06-09 (×2): qty 0.15

## 2016-06-09 MED ORDER — POTASSIUM CHLORIDE CRYS ER 20 MEQ PO TBCR
40.0000 meq | EXTENDED_RELEASE_TABLET | Freq: Once | ORAL | Status: AC
  Administered 2016-06-09: 40 meq via ORAL
  Filled 2016-06-09: qty 2

## 2016-06-09 MED ORDER — METOLAZONE 5 MG PO TABS
5.0000 mg | ORAL_TABLET | Freq: Once | ORAL | Status: AC
  Administered 2016-06-09: 5 mg via ORAL
  Filled 2016-06-09: qty 1

## 2016-06-09 NOTE — Progress Notes (Signed)
Patient ID: Theresa Rogers, female   DOB: Aug 15, 1950, 65 y.o.   MRN: [id number] S:No new complaints but a little concerned about not getting a lot of fluid off over the last 24 hours. O:BP 129/72 (BP Location: Left Arm)   Pulse 97   Temp 97.5 F (36.4 C) (Oral)   Resp 18   Ht 5\' 4"  (1.626 m)   Wt 74.2 kg (163 lb 9.6 oz)   SpO2 98%   BMI 28.08 kg/m   Intake/Output Summary (Last 24 hours) at 06/09/16 0941 Last data filed at 06/09/16 0105  Gross per 24 hour  Intake          1149.81 ml  Output             1551 ml  Net          -401.19 ml   Intake/Output: I/O last 3 completed shifts: In: 1639.8 [P.O.:1440; I.V.:199.8] Out: 9935 [Urine:3150; Stool:1]  Intake/Output this shift:  No intake/output data recorded. Weight change: 0.866 kg (1 lb 14.6 oz) Gen:WD WN WF in NAD CVS:no rub Resp:cta TSV:[id number] Ext:2+ edema   Recent Labs Lab 06/03/16 0408 06/04/16 0834 06/05/16 0801 06/05/16 1428 06/06/16 0408 06/07/16 0423 06/08/16 0516 06/09/16 0551  NA 128* 128* 127*  --  128* 130* 131*  132* 129*  K 5.2* 4.9 4.8  --  3.9 3.0* 3.4*  3.4* 3.8  CL 95* 94* 92*  --  93* 94* 93*  94* 90*  CO2 22 22 24   --  25 29 28  26 31   GLUCOSE 313* 124* 136*  --  164* 91 122*  120* 183*  BUN 97* 111* 118*  --  81* 45* 51*  51* 60*  CREATININE 3.81* 4.68* 5.16*  --  3.95* 2.72* 2.85*  2.81* 2.92*  ALBUMIN  --  2.6* 2.5*  --  2.2* 2.1* 2.3* 2.4*  CALCIUM 8.0* 8.3* 8.3*  --  7.6* 7.6* 7.8*  7.9* 8.2*  PHOS  --   --  5.7*  --  4.7* 3.6 3.5 3.6  AST  --  31  --   --   --   --   --   --   ALT  --  36  --  34  --   --   --   --    Liver Function Tests:  Recent Labs Lab 06/04/16 0834  06/05/16 1428  06/07/16 0423 06/08/16 0516 06/09/16 0551  AST 31  --   --   --   --   --   --   ALT 36  --  34  --   --   --   --   ALKPHOS 373*  --   --   --   --   --   --   BILITOT 0.5  --   --   --   --   --   --   PROT 5.9*  --   --   --   --   --   --   ALBUMIN 2.6*  < >  --   < > 2.1* 2.3* 2.4*   < > = values in this interval not displayed. No results for input(s): LIPASE, AMYLASE in the last 168 hours. No results for input(s): AMMONIA in the last 168 hours. CBC:  Recent Labs Lab 06/03/16 0408 06/04/16 0834 06/08/16 0516 06/09/16 0552  WBC 12.2* 11.6* 13.3* 14.5*  HGB 8.7* 9.5* 9.1* 9.1*  HCT 26.2* 28.1* 27.0* 27.8*  MCV 79.6 79.2 79.2 79.2  PLT 454* 532* 443* 444*   Cardiac Enzymes: No results for input(s): CKTOTAL, CKMB, CKMBINDEX, TROPONINI in the last 168 hours. CBG:  Recent Labs Lab 06/08/16 0613 06/08/16 1054 06/08/16 1652 06/08/16 2054 06/09/16 0605  GLUCAP 108* 262* 246* 294* 187*    Iron Studies: No results for input(s): IRON, TIBC, TRANSFERRIN, FERRITIN in the last 72 hours. Studies/Results: No results found. Marland Kitchen alteplase  2 mg Intracatheter Once  . atorvastatin  40 mg Oral q1800  . bisacodyl  10 mg Oral Daily   Or  . bisacodyl  10 mg Rectal Daily  . docusate sodium  200 mg Oral Daily  . furosemide  160 mg Intravenous Q8H  . guaiFENesin  600 mg Oral BID  . insulin aspart  0-15 Units Subcutaneous TID WC  . insulin aspart  6 Units Subcutaneous TID WC  . insulin detemir  12 Units Subcutaneous QHS  . lactulose  20 g Oral Once  . metoCLOPramide (REGLAN) injection  5 mg Intravenous Q6H  . pantoprazole  40 mg Oral Daily  . warfarin  2.5 mg Oral q1800  . Warfarin - Physician Dosing Inpatient   Does not apply q1800    BMET    Component Value Date/Time   NA 129 (L) 06/09/2016 0551   K 3.8 06/09/2016 0551   CL 90 (L) 06/09/2016 0551   CO2 31 06/09/2016 0551   GLUCOSE 183 (H) 06/09/2016 0551   BUN 60 (H) 06/09/2016 0551   CREATININE 2.92 (H) 06/09/2016 0551   CALCIUM 8.2 (L) 06/09/2016 0551   GFRNONAA 16 (L) 06/09/2016 0551   GFRAA 18 (L) 06/09/2016 0551   CBC    Component Value Date/Time   WBC 14.5 (H) 06/09/2016 0552   RBC 3.51 (L) 06/09/2016 0552   HGB 9.1 (L) 06/09/2016 0552   HCT 27.8 (L) 06/09/2016 0552   PLT 444 (H) 06/09/2016  0552   MCV 79.2 06/09/2016 0552   MCH 25.9 (L) 06/09/2016 0552   MCHC 32.7 06/09/2016 0552   RDW 16.5 (H) 06/09/2016 0552   LYMPHSABS 0.6 (L) 04/29/2016 1031   MONOABS 0.7 04/29/2016 1031   EOSABS 0.1 04/29/2016 1031   BASOSABS 0.1 04/29/2016 1031     Assessment/Plan:  1. AKI/CKD stage 3 in setting of ischemic ATN following CABG.  Baseline Scr 1.4-1.7 pre-op and peaked at 5.16.  S/p HD on 12/27 and 12/28.    Scr hovering around 2.8-2.9.  Good UOP.  Hold off on further HD for now and follow UOP and daily Scr. 2. NSTEMI with CAD s/p CABG 05/27/16- 3 vessel doing well 3. Ischemic cardiomyopathy- still with marked volume overload.  Unclear why she did not have such a brisk diuresis over the last 24 hours.  Continue to follow and cont with high dose Lasix, IV.  May need to add metolazone prior to a dose of IV lasix if no sig increase in UOP. 4. ABLA- follow and transfuse prn.  5. DM- poorly controlled 6. HTN- stable  Donetta Potts, MD [employer] [phone]

## 2016-06-09 NOTE — Progress Notes (Addendum)
[address]       [place] [address]             [phone]      13 Days Post-Op Procedure(s) (LRB): CORONARY ARTERY BYPASS GRAFTING (CABG) x3 using left internal mammary artery and left greater saphenous vein harvested endoscopically (N/A) TRANSESOPHAGEAL ECHOCARDIOGRAM (TEE) (N/A) Subjective:  feels ok  Objective: Vital signs in last 24 hours: Temp:  [97.5 F (36.4 C)-98 F (36.7 C)] 97.5 F (36.4 C) (12/31 0421) Pulse Rate:  [63-97] 97 (12/31 0421) Cardiac Rhythm: Normal sinus rhythm (12/30 1900) Resp:  [18] 18 (12/31 0421) BP: (120-139)/(47-72) 129/72 (12/31 0421) SpO2:  [96 %-98 %] 98 % (12/31 0421) Weight:  [163 lb 9.6 oz (74.2 kg)] 163 lb 9.6 oz (74.2 kg) (12/31 0415)  Hemodynamic parameters for last 24 hours:    Intake/Output from previous day: 12/30 0701 - 12/31 0700 In: 1629.8 [P.O.:1440; I.V.:189.8] Out: 1551 [Urine:1550; Stool:1] Intake/Output this shift: No intake/output data recorded.  General appearance: alert, cooperative and no distress Heart: regular rate and rhythm Lungs: dim in bases Abdomen: benign Extremities: + edema Wound: incis healing well  Lab Results:  Recent Labs  06/08/16 0516 06/09/16 0552  WBC 13.3* 14.5*  HGB 9.1* 9.1*  HCT 27.0* 27.8*  PLT 443* 444*   BMET:  Recent Labs  06/08/16 0516 06/09/16 0551  NA 131*  132* 129*  K 3.4*  3.4* 3.8  CL 93*  94* 90*  CO2 28  26 31   GLUCOSE 122*  120* 183*  BUN 51*  51* 60*  CREATININE 2.85*  2.81* 2.92*  CALCIUM 7.8*  7.9* 8.2*    PT/INR:  Recent Labs  06/09/16 0552  LABPROT 25.1*  INR 2.23   ABG    Component Value Date/Time   PHART 7.425 05/28/2016 0512   HCO3 25.9 05/28/2016 0512   TCO2 28 05/29/2016 1703   ACIDBASEDEF 1.0 05/27/2016 1417   O2SAT 50.5 06/09/2016 0530   CBG (last 3)   Recent Labs  06/08/16 1652 06/08/16 2054 06/09/16 0605  GLUCAP 246* 294* 187*    Meds Scheduled Meds: . alteplase  2 mg Intracatheter  Once  . atorvastatin  40 mg Oral q1800  . bisacodyl  10 mg Oral Daily   Or  . bisacodyl  10 mg Rectal Daily  . docusate sodium  200 mg Oral Daily  . furosemide  160 mg Intravenous Q8H  . guaiFENesin  600 mg Oral BID  . insulin aspart  0-15 Units Subcutaneous TID WC  . insulin aspart  6 Units Subcutaneous TID WC  . insulin detemir  12 Units Subcutaneous QHS  . lactulose  20 g Oral Once  . metoCLOPramide (REGLAN) injection  5 mg Intravenous Q6H  . pantoprazole  40 mg Oral Daily  . warfarin  2.5 mg Oral q1800  . Warfarin - Physician Dosing Inpatient   Does not apply q1800   Continuous Infusions: . amiodarone 30 mg/hr (06/09/16 0419)  . milrinone 0.25 mcg/kg/min (06/09/16 0418)   PRN Meds:.guaiFENesin-dextromethorphan, lidocaine (PF), ondansetron (ZOFRAN) IV, oxyCODONE, promethazine, sodium chloride flush, traMADol  Xrays No results found.  Assessment/Plan: S/P Procedure(s) (LRB): CORONARY ARTERY BYPASS GRAFTING (CABG) x3 using left internal mammary artery and left greater saphenous vein harvested endoscopically (N/A) TRANSESOPHAGEAL ECHOCARDIOGRAM (TEE) (N/A)  1 remains significantly volume overloaded- more hyponatremic- may be some component of pseudohyponatremia with elevated sugars. , creat slightly increased- good UO - nephrology to eval for poss benefit of  HD again. 2 CBC shows sl increase in leukocytosisto 14.5 No fevers, push pulm toilet /rehab as able, will repeat UA with culture 3 CO-OX a little better- wean milrinone per AHF team management. In sinus on amio gtt- prob transition back to po soon  4 cont current coumadin dosing 5 increase insulin as sugars not as well controlled  LOS: 20 days    GOLD,WAYNE E 06/09/2016 Patient seen and examined, agree with above Dr. Aundra Dubin adjusted diuretic regimen  Revonda Standard. Roxan Hockey, MD [facility] [phone]

## 2016-06-09 NOTE — Progress Notes (Signed)
Patient ID: Theresa Rogers, female   DOB: 1950/09/20, 65 y.o.   MRN: [id number]    Advanced Heart Failure Rounding Note   Subjective:    Transferred from [facility] with volume overload despite IV diuresis + milrinone.   Now s/p CABG x 3 on 05/27/16  Milrinone and IV lasix restarted 12/23, required initiation of CVVH then HD.  She remains on milrinone 0.25.  Co-ox 51% when done this morning (56% yesterday).  UOP down yesterday despite same Lasix dosing. Last HD was 12/28.  Creatinine up marginally to 2.92. Weight up 3 lbs, feels more swollen.   She is back in NSR today on IV amiodarone.    Objective:   Weight Range:  Vital Signs:   Temp:  [97.5 F (36.4 C)-98 F (36.7 C)] 97.5 F (36.4 C) (12/31 0421) Pulse Rate:  [63-97] 97 (12/31 0421) Resp:  [18] 18 (12/31 0421) BP: (120-139)/(47-72) 129/72 (12/31 0421) SpO2:  [96 %-98 %] 98 % (12/31 0421) Weight:  [163 lb 9.6 oz (74.2 kg)] 163 lb 9.6 oz (74.2 kg) (12/31 0415) Last BM Date: 06/08/16  Weight change: Filed Weights   06/08/16 0339 06/08/16 0543 06/09/16 0415  Weight: 161 lb 11 oz (73.3 kg) 160 lb 8 oz (72.8 kg) 163 lb 9.6 oz (74.2 kg)    Intake/Output:   Intake/Output Summary (Last 24 hours) at 06/09/16 1025 Last data filed at 06/09/16 0105  Gross per 24 hour  Intake          1149.81 ml  Output             1551 ml  Net          -401.19 ml     Physical Exam: General:  Sitting in bed. NAD HEENT: Normal Neck: supple. JVP 12 cm. Carotids 2+ bilat; no bruits. No thyromegaly or nodule noted.  Cor: sternal dressing irregular S1S2, mildly tachy.  Lungs: Clear but diminished basilar sounds.  Abdomen: soft, NT, ND, no HSM. No bruits or masses. +BS  Extremities: no cyanosis, clubbing, rash, 2+ edema up to knees.  Neuro: alert & orientedx3, cranial nerves grossly intact. moves all 4 extremities w/o difficulty. Affect pleasant  Telemetry: Reviewed, atrial fibrillation in 100s    Labs: Basic Metabolic Panel:  Recent  Labs Lab 06/05/16 0801 06/06/16 0408 06/07/16 0423 06/08/16 0516 06/09/16 0551  NA 127* 128* 130* 131*  132* 129*  K 4.8 3.9 3.0* 3.4*  3.4* 3.8  CL 92* 93* 94* 93*  94* 90*  CO2 24 25 29 28  26 31   GLUCOSE 136* 164* 91 122*  120* 183*  BUN 118* 81* 45* 51*  51* 60*  CREATININE 5.16* 3.95* 2.72* 2.85*  2.81* 2.92*  CALCIUM 8.3* 7.6* 7.6* 7.8*  7.9* 8.2*  PHOS 5.7* 4.7* 3.6 3.5 3.6    Liver Function Tests:  Recent Labs Lab 06/04/16 0834 06/05/16 0801 06/05/16 1428 06/06/16 0408 06/07/16 0423 06/08/16 0516 06/09/16 0551  AST 31  --   --   --   --   --   --   ALT 36  --  34  --   --   --   --   ALKPHOS 373*  --   --   --   --   --   --   BILITOT 0.5  --   --   --   --   --   --   PROT 5.9*  --   --   --   --   --   --  ALBUMIN 2.6* 2.5*  --  2.2* 2.1* 2.3* 2.4*   No results for input(s): LIPASE, AMYLASE in the last 168 hours. No results for input(s): AMMONIA in the last 168 hours.  CBC:  Recent Labs Lab 06/03/16 0408 06/04/16 0834 06/08/16 0516 06/09/16 0552  WBC 12.2* 11.6* 13.3* 14.5*  HGB 8.7* 9.5* 9.1* 9.1*  HCT 26.2* 28.1* 27.0* 27.8*  MCV 79.6 79.2 79.2 79.2  PLT 454* 532* 443* 444*    Cardiac Enzymes: No results for input(s): CKTOTAL, CKMB, CKMBINDEX, TROPONINI in the last 168 hours.  BNP: BNP (last 3 results)  Recent Labs  06/19/15 0923 05/13/16 0905 06/04/16 0834  BNP 1,782.0* 2,790.0* 3,366.3*    ProBNP (last 3 results) No results for input(s): PROBNP in the last 8760 hours.    Other results:  Imaging: No results found.   Medications:     Scheduled Medications: . alteplase  2 mg Intracatheter Once  . atorvastatin  40 mg Oral q1800  . bisacodyl  10 mg Oral Daily   Or  . bisacodyl  10 mg Rectal Daily  . docusate sodium  200 mg Oral Daily  . furosemide  160 mg Intravenous Q8H  . guaiFENesin  600 mg Oral BID  . insulin aspart  12 Units Subcutaneous TID WC  . insulin detemir  15 Units Subcutaneous QHS  . lactulose   20 g Oral Once  . metoCLOPramide (REGLAN) injection  5 mg Intravenous Q6H  . metolazone  5 mg Oral Once  . pantoprazole  40 mg Oral Daily  . potassium chloride  40 mEq Oral Once  . warfarin  2.5 mg Oral q1800  . Warfarin - Physician Dosing Inpatient   Does not apply q1800    Infusions: . amiodarone 30 mg/hr (06/09/16 0419)  . milrinone 0.25 mcg/kg/min (06/09/16 0418)    PRN Medications: guaiFENesin-dextromethorphan, lidocaine (PF), ondansetron (ZOFRAN) IV, oxyCODONE, promethazine, sodium chloride flush, traMADol   Assessment:   1. A/C Systolic Heart Failure: Ischemic cardiomyopathy, EF 20% with mild LV dilation on 12/17 echo.  2. NSTEMI with CAD-severe 2 vessel disease LAD RCA    - s/p CABG 12/18 with LIMA-LAD, SVG-RCA and SVG-diagonal 3. AKI on CKD: Required CVVH then HD.  4. Uncontrolled DM- Hgb AC 11.3  5. Hyperkalemia  6. Elevated TSH 7. COPD  8. PAF: on amiodarone.    Plan/Discussion:    She was on CVVH, now has had 2 sessions of HD but none since 12/28.  Weight is up 3 lbs and UOP down.  She is volume overloaded on exam.  - Continue IV Lasix today. Will gently replace K.  - I will add a dose of metolazone 5 mg po x 1 before her next dose of Lasix.  - Continue to follow for HD need.     Co-ox 51% early this am on milrinone 0.25. 56.5% yesterday.  Will not change milrinone.   No ACE/ARB with AKI. No b-blocker with low output. Continue statin.     Back in NSR this morning, coumadin started 12/24. INR therapeutic.  Continue to load amiodarone IV.  Ranolazine is a consideration but would like to see renal function stabilized before using.   Length of Stay: [place], [place]  06/09/2016, 10:25 AM  Advanced Heart Failure Team Pager [phone] (M-F; 7a - 4p)  Please contact [facility] for night-coverage after hours (4p -7a ) and weekends on [email or web address]

## 2016-06-10 DIAGNOSIS — I219 Acute myocardial infarction, unspecified: Secondary | ICD-10-CM

## 2016-06-10 HISTORY — PX: EYE SURGERY: SHX253

## 2016-06-10 HISTORY — DX: Acute myocardial infarction, unspecified: I21.9

## 2016-06-10 LAB — COMPREHENSIVE METABOLIC PANEL
ALT: 31 U/L (ref 14–54)
ANION GAP: 13 (ref 5–15)
AST: 27 U/L (ref 15–41)
Albumin: 2.3 g/dL — ABNORMAL LOW (ref 3.5–5.0)
Alkaline Phosphatase: 307 U/L — ABNORMAL HIGH (ref 38–126)
BUN: 67 mg/dL — ABNORMAL HIGH (ref 6–20)
CALCIUM: 8.3 mg/dL — AB (ref 8.9–10.3)
CHLORIDE: 87 mmol/L — AB (ref 101–111)
CO2: 28 mmol/L (ref 22–32)
Creatinine, Ser: 2.96 mg/dL — ABNORMAL HIGH (ref 0.44–1.00)
GFR calc non Af Amer: 16 mL/min — ABNORMAL LOW (ref >60)
GFR, EST AFRICAN AMERICAN: 18 mL/min — AB (ref >60)
Glucose, Bld: 315 mg/dL — ABNORMAL HIGH (ref 65–99)
Potassium: 3.5 mmol/L (ref 3.5–5.1)
SODIUM: 128 mmol/L — AB (ref 135–145)
Total Bilirubin: 0.5 mg/dL (ref 0.3–1.2)
Total Protein: 5.5 g/dL — ABNORMAL LOW (ref 6.5–8.1)

## 2016-06-10 LAB — CBC
HCT: 27.3 % — ABNORMAL LOW (ref 36.0–46.0)
Hemoglobin: 9.2 g/dL — ABNORMAL LOW (ref 12.0–15.0)
MCH: 26.6 pg (ref 26.0–34.0)
MCHC: 33.7 g/dL (ref 30.0–36.0)
MCV: 78.9 fL (ref 78.0–100.0)
Platelets: 416 10*3/uL — ABNORMAL HIGH (ref 150–400)
RBC: 3.46 MIL/uL — AB (ref 3.87–5.11)
RDW: 16.4 % — ABNORMAL HIGH (ref 11.5–15.5)
WBC: 12.9 10*3/uL — ABNORMAL HIGH (ref 4.0–10.5)

## 2016-06-10 LAB — PHOSPHORUS: Phosphorus: 4.2 mg/dL (ref 2.5–4.6)

## 2016-06-10 LAB — GLUCOSE, CAPILLARY
GLUCOSE-CAPILLARY: 269 mg/dL — AB (ref 65–99)
GLUCOSE-CAPILLARY: 67 mg/dL (ref 65–99)
GLUCOSE-CAPILLARY: 155 mg/dL — AB (ref 65–99)
GLUCOSE-CAPILLARY: 241 mg/dL — AB (ref 65–99)
GLUCOSE-CAPILLARY: 296 mg/dL — AB (ref 65–99)

## 2016-06-10 LAB — COOXEMETRY PANEL
Carboxyhemoglobin: 1.1 % (ref 0.5–1.5)
Methemoglobin: 1 % (ref 0.0–1.5)
O2 Saturation: 65.3 %
Total hemoglobin: 9.1 g/dL — ABNORMAL LOW (ref 12.0–16.0)

## 2016-06-10 LAB — PROTIME-INR
INR: 3.18
Prothrombin Time: 33.3 seconds — ABNORMAL HIGH (ref 11.4–15.2)

## 2016-06-10 LAB — ALBUMIN: Albumin: 2.3 g/dL — ABNORMAL LOW (ref 3.5–5.0)

## 2016-06-10 MED ORDER — INSULIN DETEMIR 100 UNIT/ML ~~LOC~~ SOLN
25.0000 [IU] | Freq: Every day | SUBCUTANEOUS | Status: DC
  Administered 2016-06-10: 25 [IU] via SUBCUTANEOUS
  Filled 2016-06-10: qty 0.25

## 2016-06-10 MED ORDER — METOLAZONE 2.5 MG PO TABS: 2.5000 mg | ORAL_TABLET | Freq: Once | ORAL | Status: DC

## 2016-06-10 MED ORDER — POTASSIUM CHLORIDE CRYS ER 20 MEQ PO TBCR
40.0000 meq | EXTENDED_RELEASE_TABLET | Freq: Once | ORAL | Status: AC
  Administered 2016-06-10: 40 meq via ORAL
  Filled 2016-06-10: qty 2

## 2016-06-10 MED ORDER — METOLAZONE 5 MG PO TABS
5.0000 mg | ORAL_TABLET | Freq: Once | ORAL | Status: AC
  Administered 2016-06-10: 5 mg via ORAL
  Filled 2016-06-10: qty 1

## 2016-06-10 MED ORDER — INSULIN ASPART 100 UNIT/ML ~~LOC~~ SOLN: 15.0000 [IU] | Freq: Three times a day (TID) | SUBCUTANEOUS | Status: DC

## 2016-06-10 MED ORDER — INSULIN ASPART 100 UNIT/ML ~~LOC~~ SOLN
4.0000 [IU] | Freq: Three times a day (TID) | SUBCUTANEOUS | Status: DC
  Administered 2016-06-10: 4 [IU] via SUBCUTANEOUS

## 2016-06-10 NOTE — Progress Notes (Signed)
Patient ID: Theresa Rogers, female   DOB: December 01, 1950, 66 y.o.   MRN: [id number]    Advanced Heart Failure Rounding Note   Subjective:    Transferred from [facility] with volume overload despite IV diuresis + milrinone.   Now s/p CABG x 3 on 05/27/16  Milrinone and IV lasix restarted 12/23, required initiation of CVVH then HD.  She remains on milrinone 0.25.  Co-ox better today at 65%, better UOP with addition of metolazone.  Weight down 3 lbs.  Creatinine up marginally to 2.96.   She remains in NSR today on IV amiodarone.    Frustrated, ready to go home, wants catheters out.   Objective:   Weight Range:  Vital Signs:   Temp:  [97.5 F (36.4 C)-97.9 F (36.6 C)] 97.5 F (36.4 C) (01/01 0446) Pulse Rate:  [63-87] 63 (01/01 0446) Resp:  [18-19] 18 (01/01 0446) BP: (129-146)/(58-73) 146/73 (01/01 0446) SpO2:  [94 %-99 %] 99 % (01/01 0446) Weight:  [160 lb 1.6 oz (72.6 kg)] 160 lb 1.6 oz (72.6 kg) (01/01 0446) Last BM Date: 06/09/16  Weight change: Filed Weights   06/08/16 0543 06/09/16 0415 06/10/16 0446  Weight: 160 lb 8 oz (72.8 kg) 163 lb 9.6 oz (74.2 kg) 160 lb 1.6 oz (72.6 kg)    Intake/Output:   Intake/Output Summary (Last 24 hours) at 06/10/16 0935 Last data filed at 06/10/16 0901  Gross per 24 hour  Intake              600 ml  Output             3300 ml  Net            -2700 ml     Physical Exam: General:  Sitting in bed. NAD HEENT: Normal Neck: supple. JVP 12+ cm. Carotids 2+ bilat; no bruits. No thyromegaly or nodule noted.  Cor: Regular S1S2, no murmur, no S3/S4 Lungs: Clear but diminished basilar sounds.  Abdomen: soft, NT, ND, no HSM. No bruits or masses. +BS  Extremities: no cyanosis, clubbing, rash, 2+ edema up to knees.  Neuro: alert & orientedx3, cranial nerves grossly intact. moves all 4 extremities w/o difficulty. Affect pleasant  Telemetry: Reviewed, atrial fibrillation in 100s    Labs: Basic Metabolic Panel:  Recent Labs Lab 06/05/16 0801  06/06/16 0408 06/07/16 0423 06/08/16 0516 06/09/16 0551 06/10/16 0533  NA 127* 128* 130* 131*  132* 129* 128*  K 4.8 3.9 3.0* 3.4*  3.4* 3.8 3.5  CL 92* 93* 94* 93*  94* 90* 87*  CO2 24 25 29 28  26 31 28   GLUCOSE 136* 164* 91 122*  120* 183* 315*  BUN 118* 81* 45* 51*  51* 60* 67*  CREATININE 5.16* 3.95* 2.72* 2.85*  2.81* 2.92* 2.96*  CALCIUM 8.3* 7.6* 7.6* 7.8*  7.9* 8.2* 8.3*  PHOS 5.7* 4.7* 3.6 3.5 3.6  --     Liver Function Tests:  Recent Labs Lab 06/04/16 0834  06/05/16 1428 06/06/16 0408 06/07/16 0423 06/08/16 0516 06/09/16 0551 06/10/16 0533  AST 31  --   --   --   --   --   --  27  ALT 36  --  34  --   --   --   --  31  ALKPHOS 373*  --   --   --   --   --   --  307*  BILITOT 0.5  --   --   --   --   --   --  0.5  PROT 5.9*  --   --   --   --   --   --  5.5*  ALBUMIN 2.6*  < >  --  2.2* 2.1* 2.3* 2.4* 2.3*  < > = values in this interval not displayed. No results for input(s): LIPASE, AMYLASE in the last 168 hours. No results for input(s): AMMONIA in the last 168 hours.  CBC:  Recent Labs Lab 06/04/16 0834 06/08/16 0516 06/09/16 0552 06/10/16 0533  WBC 11.6* 13.3* 14.5* 12.9*  HGB 9.5* 9.1* 9.1* 9.2*  HCT 28.1* 27.0* 27.8* 27.3*  MCV 79.2 79.2 79.2 78.9  PLT 532* 443* 444* 416*    Cardiac Enzymes: No results for input(s): CKTOTAL, CKMB, CKMBINDEX, TROPONINI in the last 168 hours.  BNP: BNP (last 3 results)  Recent Labs  06/19/15 0923 05/13/16 0905 06/04/16 0834  BNP 1,782.0* 2,790.0* 3,366.3*    ProBNP (last 3 results) No results for input(s): PROBNP in the last 8760 hours.    Other results:  Imaging: No results found.   Medications:     Scheduled Medications: . alteplase  2 mg Intracatheter Once  . atorvastatin  40 mg Oral q1800  . bisacodyl  10 mg Oral Daily   Or  . bisacodyl  10 mg Rectal Daily  . docusate sodium  200 mg Oral Daily  . furosemide  160 mg Intravenous Q8H  . guaiFENesin  600 mg Oral BID  .  insulin aspart  15 Units Subcutaneous TID WC  . insulin detemir  25 Units Subcutaneous QHS  . lactulose  20 g Oral Once  . metoCLOPramide (REGLAN) injection  5 mg Intravenous Q6H  . metolazone  2.5 mg Oral Once  . pantoprazole  40 mg Oral Daily  . potassium chloride  40 mEq Oral Once  . Warfarin - Physician Dosing Inpatient   Does not apply q1800    Infusions: . amiodarone 30 mg/hr (06/10/16 0436)  . milrinone 0.25 mcg/kg/min (06/09/16 2255)    PRN Medications: guaiFENesin-dextromethorphan, lidocaine (PF), ondansetron (ZOFRAN) IV, oxyCODONE, promethazine, sodium chloride flush, traMADol   Assessment:   1. A/C Systolic Heart Failure: Ischemic cardiomyopathy, EF 20% with mild LV dilation on 12/17 echo.  2. NSTEMI with CAD-severe 2 vessel disease LAD RCA    - s/p CABG 12/18 with LIMA-LAD, SVG-RCA and SVG-diagonal 3. AKI on CKD: Required CVVH then HD.  4. Uncontrolled DM- Hgb AC 11.3  5. Hyperkalemia  6. Elevated TSH 7. COPD  8. PAF: on amiodarone.    Plan/Discussion:    She was on CVVH, now has had 2 sessions of HD but none since 12/28.  She remains quite volume overloaded on exam.  She diuresed better yesterday with addition of metolazone, creatinine marginally higher.  - Continue IV Lasix today. Will gently replace K.  - I will add a dose of metolazone 5 mg po x 1 before her next dose of Lasix again today.  - Continue to follow for HD need.     Co-ox 65% early this am on milrinone 0.25. This is improved.  Once she is better-diuresed, will start titrating down on milrinone slowly.    No ACE/ARB with AKI. No b-blocker with low output. Continue statin.     Back in NSR this morning, coumadin started 12/24. INR supratherapeutic.  Continue to load amiodarone IV.  Ranolazine is a consideration but would like to see renal function stabilized before using.   She has been in the hospital a long time and is anxious  to go home.  I explained to her that there are still a number of  outstanding issues that must be resolved before she can safely go home.   Length of Stay: 77  Loralie Champagne, MD  06/10/2016, 9:35 AM  Advanced Heart Failure Team Pager [phone] (M-F; 7a - 4p)  Please contact [facility] for night-coverage after hours (4p -7a ) and weekends on [email or web address]

## 2016-06-10 NOTE — Progress Notes (Signed)
Inpatient Diabetes Program Recommendations  AACE/ADA: New Consensus Statement on Inpatient Glycemic Control (2015)  Target Ranges:  Prepandial:   less than 140 mg/dL      Peak postprandial:   less than 180 mg/dL (1-2 hours)      Critically ill patients:  140 - 180 mg/dL   Lab Results  Component Value Date   GLUCAP 155 (H) 06/10/2016   HGBA1C 11.2 (H) 05/22/2016    Review of Glycemic Control Results for Theresa Rogers, Theresa Rogers (MRN [id number]) as of 06/10/2016 13:17  Ref. Range 06/09/2016 16:39 06/09/2016 21:37 06/10/2016 06:13 06/10/2016 08:54 06/10/2016 11:21  Glucose-Capillary Latest Ref Range: 65 - 99 mg/dL 341 (H) 378 (H) 269 (H) 67 155 (H)   Inpatient Diabetes Program Recommendations:  Noted CBG dropped  To 67post Novolog 6 units correction this am.  Please consider decrease in Novolog meal coverage to 3-4 units tid if eats 50%. Spoke with [name] PA by phone and received verbal order to decrease meal coverage to 4 units tid if eats 50%.  Thank you, [name]. [name], RN, MSN, CDE Inpatient Glycemic Control Team Team Pager [phone] (8am-5pm) 06/10/2016 1:20 PM

## 2016-06-10 NOTE — Progress Notes (Signed)
Patient ID: Theresa Rogers, female   DOB: 19-Dec-1950, 66 y.o.   MRN: [id number] S:feeling better today O:BP (!) 146/73 (BP Location: Left Arm)   Pulse 63   Temp 97.5 F (36.4 C) (Oral)   Resp 18   Ht 5\' 4"  (1.626 m)   Wt 72.6 kg (160 lb 1.6 oz)   SpO2 99%   BMI 27.48 kg/m   Intake/Output Summary (Last 24 hours) at 06/10/16 0941 Last data filed at 06/10/16 0901  Gross per 24 hour  Intake              600 ml  Output             3300 ml  Net            -2700 ml   Intake/Output: I/O last 3 completed shifts: In: 960 [P.O.:960] Out: 2850 [Urine:2850]  Intake/Output this shift:  Total I/O In: -  Out: 1000 [Urine:1000] Weight change: -1.588 kg (-3 lb 8 oz) Gen:wd wn wf in nad CVS:no rub Resp:bibasilar crackles UXN:ATFTDD Ext: 1+ edema   Recent Labs Lab 06/04/16 0834 06/05/16 0801 06/05/16 1428 06/06/16 0408 06/07/16 0423 06/08/16 0516 06/09/16 0551 06/10/16 0533  NA 128* 127*  --  128* 130* 131*  132* 129* 128*  K 4.9 4.8  --  3.9 3.0* 3.4*  3.4* 3.8 3.5  CL 94* 92*  --  93* 94* 93*  94* 90* 87*  CO2 22 24  --  25 29 28  26 31 28   GLUCOSE 124* 136*  --  164* 91 122*  120* 183* 315*  BUN 111* 118*  --  81* 45* 51*  51* 60* 67*  CREATININE 4.68* 5.16*  --  3.95* 2.72* 2.85*  2.81* 2.92* 2.96*  ALBUMIN 2.6* 2.5*  --  2.2* 2.1* 2.3* 2.4* 2.3*  CALCIUM 8.3* 8.3*  --  7.6* 7.6* 7.8*  7.9* 8.2* 8.3*  PHOS  --  5.7*  --  4.7* 3.6 3.5 3.6  --   AST 31  --   --   --   --   --   --  27  ALT 36  --  34  --   --   --   --  31   Liver Function Tests:  Recent Labs Lab 06/04/16 0834  06/05/16 1428  06/08/16 0516 06/09/16 0551 06/10/16 0533  AST 31  --   --   --   --   --  27  ALT 36  --  34  --   --   --  31  ALKPHOS 373*  --   --   --   --   --  307*  BILITOT 0.5  --   --   --   --   --  0.5  PROT 5.9*  --   --   --   --   --  5.5*  ALBUMIN 2.6*  < >  --   < > 2.3* 2.4* 2.3*  < > = values in this interval not displayed. No results for input(s): LIPASE,  AMYLASE in the last 168 hours. No results for input(s): AMMONIA in the last 168 hours. CBC:  Recent Labs Lab 06/04/16 0834 06/08/16 0516 06/09/16 0552 06/10/16 0533  WBC 11.6* 13.3* 14.5* 12.9*  HGB 9.5* 9.1* 9.1* 9.2*  HCT 28.1* 27.0* 27.8* 27.3*  MCV 79.2 79.2 79.2 78.9  PLT 532* 443* 444* 416*   Cardiac Enzymes: No results for input(s): CKTOTAL, CKMB,  CKMBINDEX, TROPONINI in the last 168 hours. CBG:  Recent Labs Lab 06/09/16 1102 06/09/16 1639 06/09/16 2137 06/10/16 0613 06/10/16 0854  GLUCAP 148* 341* 378* 269* 67    Iron Studies: No results for input(s): IRON, TIBC, TRANSFERRIN, FERRITIN in the last 72 hours. Studies/Results: No results found. Marland Kitchen alteplase  2 mg Intracatheter Once  . atorvastatin  40 mg Oral q1800  . bisacodyl  10 mg Oral Daily   Or  . bisacodyl  10 mg Rectal Daily  . docusate sodium  200 mg Oral Daily  . furosemide  160 mg Intravenous Q8H  . guaiFENesin  600 mg Oral BID  . insulin aspart  15 Units Subcutaneous TID WC  . insulin detemir  25 Units Subcutaneous QHS  . lactulose  20 g Oral Once  . metoCLOPramide (REGLAN) injection  5 mg Intravenous Q6H  . metolazone  2.5 mg Oral Once  . pantoprazole  40 mg Oral Daily  . potassium chloride  40 mEq Oral Once  . Warfarin - Physician Dosing Inpatient   Does not apply q1800    BMET    Component Value Date/Time   NA 128 (L) 06/10/2016 0533   K 3.5 06/10/2016 0533   CL 87 (L) 06/10/2016 0533   CO2 28 06/10/2016 0533   GLUCOSE 315 (H) 06/10/2016 0533   BUN 67 (H) 06/10/2016 0533   CREATININE 2.96 (H) 06/10/2016 0533   CALCIUM 8.3 (L) 06/10/2016 0533   GFRNONAA 16 (L) 06/10/2016 0533   GFRAA 18 (L) 06/10/2016 0533   CBC    Component Value Date/Time   WBC 12.9 (H) 06/10/2016 0533   RBC 3.46 (L) 06/10/2016 0533   HGB 9.2 (L) 06/10/2016 0533   HCT 27.3 (L) 06/10/2016 0533   PLT 416 (H) 06/10/2016 0533   MCV 78.9 06/10/2016 0533   MCH 26.6 06/10/2016 0533   MCHC 33.7 06/10/2016 0533    RDW 16.4 (H) 06/10/2016 0533   LYMPHSABS 0.6 (L) 04/29/2016 1031   MONOABS 0.7 04/29/2016 1031   EOSABS 0.1 04/29/2016 1031   BASOSABS 0.1 04/29/2016 1031     Assessment/Plan:  1. AKI/CKD stage 3 in setting of ischemic ATN following CABG.  Baseline Scr 1.4-1.7 pre-op and peaked at 5.16.  S/p HD on 12/27 and 12/28.    Scr hovering around 2.8-2.9.  Good UOP.  Hold off on further HD for now and follow UOP and daily Scr. 1. Ok to dc temp HD catheter. 2. NSTEMI with CAD s/p CABG 05/27/16- 3 vessel doing well 3. Ischemic cardiomyopathy- still with marked volume overload.  Unclear why she did not have such a brisk diuresis over the last 24 hours.  Continue to follow and cont with high dose Lasix, IV.  May need to add metolazone prior to a dose of IV lasix if no sig increase in UOP. 4. Hyponatremia- due to chf/volume xs 5. ABLA- follow and transfuse prn.  6. DM- poorly controlled 7. HTN- stable  Donetta Potts, MD [employer] [phone]

## 2016-06-10 NOTE — Progress Notes (Addendum)
[address]       [place] [address]             [phone]      14 Days Post-Op Procedure(s) (LRB): CORONARY ARTERY BYPASS GRAFTING (CABG) x3 using left internal mammary artery and left greater saphenous vein harvested endoscopically (N/A) TRANSESOPHAGEAL ECHOCARDIOGRAM (TEE) (N/A) Subjective: Feeling fairly well   Objective: Vital signs in last 24 hours: Temp:  [97.5 F (36.4 C)-97.9 F (36.6 C)] 97.5 F (36.4 C) (01/01 0446) Pulse Rate:  [63-87] 63 (01/01 0446) Cardiac Rhythm: Heart block (01/01 0700) Resp:  [18-19] 18 (01/01 0446) BP: (129-146)/(58-73) 146/73 (01/01 0446) SpO2:  [94 %-99 %] 99 % (01/01 0446) Weight:  [160 lb 1.6 oz (72.6 kg)] 160 lb 1.6 oz (72.6 kg) (01/01 0446)  Hemodynamic parameters for last 24 hours:    Intake/Output from previous day: 12/31 0701 - 01/01 0700 In: 960 [P.O.:960] Out: 2300 [Urine:2300] Intake/Output this shift: No intake/output data recorded.  General appearance: alert, cooperative and no distress Heart: regular rate and rhythm Lungs: mildly dim in bases Abdomen: benign Extremities: + pitting edema persists Wound: incis ok, healing well  Lab Results:  Recent Labs  06/09/16 0552 06/10/16 0533  WBC 14.5* 12.9*  HGB 9.1* 9.2*  HCT 27.8* 27.3*  PLT 444* 416*   BMET:  Recent Labs  06/09/16 0551 06/10/16 0533  NA 129* 128*  K 3.8 3.5  CL 90* 87*  CO2 31 28  GLUCOSE 183* 315*  BUN 60* 67*  CREATININE 2.92* 2.96*  CALCIUM 8.2* 8.3*    PT/INR:  Recent Labs  06/10/16 0533  LABPROT 33.3*  INR 3.18   ABG    Component Value Date/Time   PHART 7.425 05/28/2016 0512   HCO3 25.9 05/28/2016 0512   TCO2 28 05/29/2016 1703   ACIDBASEDEF 1.0 05/27/2016 1417   O2SAT 65.3 06/10/2016 0500   CBG (last 3)   Recent Labs  06/09/16 1639 06/09/16 2137 06/10/16 0613  GLUCAP 341* 378* 269*    Meds Scheduled Meds: . alteplase  2 mg Intracatheter Once  . atorvastatin  40 mg Oral q1800  .  bisacodyl  10 mg Oral Daily   Or  . bisacodyl  10 mg Rectal Daily  . docusate sodium  200 mg Oral Daily  . furosemide  160 mg Intravenous Q8H  . guaiFENesin  600 mg Oral BID  . insulin aspart  12 Units Subcutaneous TID WC  . insulin detemir  15 Units Subcutaneous QHS  . lactulose  20 g Oral Once  . metoCLOPramide (REGLAN) injection  5 mg Intravenous Q6H  . pantoprazole  40 mg Oral Daily  . warfarin  2.5 mg Oral q1800  . Warfarin - Physician Dosing Inpatient   Does not apply q1800   Continuous Infusions: . amiodarone 30 mg/hr (06/10/16 0436)  . milrinone 0.25 mcg/kg/min (06/09/16 2255)   PRN Meds:.guaiFENesin-dextromethorphan, lidocaine (PF), ondansetron (ZOFRAN) IV, oxyCODONE, promethazine, sodium chloride flush, traMADol  Xrays No results found.  Assessment/Plan: S/P Procedure(s) (LRB): CORONARY ARTERY BYPASS GRAFTING (CABG) x3 using left internal mammary artery and left greater saphenous vein harvested endoscopically (N/A) TRANSESOPHAGEAL ECHOCARDIOGRAM (TEE) (N/A)  1 conts with interm afib, cont amio, poss po soon 2 leukocytosis improved, UA neg, no fevers 3 hyponatremia, hypochloremia sl worse, sugars poorly controllled. May be intravasc dry- nutrition (Decrease albumin) playing a part in edema/third spacing. Adjust insulin dose 4 nephrology and AHF team managing primary medical issues of CHF/renal failure- CO-OX  65, wean milrinone as able 5 hold coumadin today  '  LOS: 21 days    GOLD,WAYNE E 06/10/2016 Patient seen and examined, agree with above Co-ox better today INR jumped significantly- coumadin on hold  Steven C. Roxan Hockey, MD [facility] [phone]

## 2016-06-11 ENCOUNTER — Inpatient Hospital Stay (HOSPITAL_COMMUNITY): Payer: Medicaid Other

## 2016-06-11 LAB — RENAL FUNCTION PANEL
Albumin: 2.6 g/dL — ABNORMAL LOW (ref 3.5–5.0)
Anion gap: 11 (ref 5–15)
BUN: 70 mg/dL — ABNORMAL HIGH (ref 6–20)
CO2: 33 mmol/L — ABNORMAL HIGH (ref 22–32)
Calcium: 8.7 mg/dL — ABNORMAL LOW (ref 8.9–10.3)
Chloride: 87 mmol/L — ABNORMAL LOW (ref 101–111)
Creatinine, Ser: 2.83 mg/dL — ABNORMAL HIGH (ref 0.44–1.00)
GFR calc Af Amer: 19 mL/min — ABNORMAL LOW
GFR calc non Af Amer: 17 mL/min — ABNORMAL LOW
Glucose, Bld: 54 mg/dL — ABNORMAL LOW (ref 65–99)
Phosphorus: 4.8 mg/dL — ABNORMAL HIGH (ref 2.5–4.6)
Potassium: 3.1 mmol/L — ABNORMAL LOW (ref 3.5–5.1)
Sodium: 131 mmol/L — ABNORMAL LOW (ref 135–145)

## 2016-06-11 LAB — CBC
HCT: 27.4 % — ABNORMAL LOW (ref 36.0–46.0)
HEMOGLOBIN: 9.2 g/dL — AB (ref 12.0–15.0)
MCH: 26.4 pg (ref 26.0–34.0)
MCHC: 33.6 g/dL (ref 30.0–36.0)
MCV: 78.7 fL (ref 78.0–100.0)
PLATELETS: 422 10*3/uL — AB (ref 150–400)
RBC: 3.48 MIL/uL — AB (ref 3.87–5.11)
RDW: 16.6 % — ABNORMAL HIGH (ref 11.5–15.5)
WBC: 11.1 10*3/uL — AB (ref 4.0–10.5)

## 2016-06-11 LAB — COOXEMETRY PANEL
CARBOXYHEMOGLOBIN: 0.9 % (ref 0.5–1.5)
Carboxyhemoglobin: 1 % (ref 0.5–1.5)
Methemoglobin: 1.1 % (ref 0.0–1.5)
Methemoglobin: 1.1 % (ref 0.0–1.5)
O2 Saturation: 56.9 %
O2 Saturation: 49.7 %
Total hemoglobin: 13.4 g/dL (ref 12.0–16.0)
Total hemoglobin: 9.9 g/dL — ABNORMAL LOW (ref 12.0–16.0)

## 2016-06-11 LAB — BASIC METABOLIC PANEL
Anion gap: 15 (ref 5–15)
BUN: 73 mg/dL — AB (ref 6–20)
CALCIUM: 8.4 mg/dL — AB (ref 8.9–10.3)
CO2: 30 mmol/L (ref 22–32)
CREATININE: 2.89 mg/dL — AB (ref 0.44–1.00)
Chloride: 85 mmol/L — ABNORMAL LOW (ref 101–111)
GFR calc Af Amer: 19 mL/min — ABNORMAL LOW (ref >60)
GFR calc non Af Amer: 16 mL/min — ABNORMAL LOW (ref >60)
Glucose, Bld: 222 mg/dL — ABNORMAL HIGH (ref 65–99)
POTASSIUM: 3.5 mmol/L (ref 3.5–5.1)
SODIUM: 130 mmol/L — AB (ref 135–145)

## 2016-06-11 LAB — PROTIME-INR
INR: 3.46
Prothrombin Time: 35.6 s — ABNORMAL HIGH (ref 11.4–15.2)

## 2016-06-11 LAB — GLUCOSE, CAPILLARY
GLUCOSE-CAPILLARY: 40 mg/dL — AB (ref 65–99)
GLUCOSE-CAPILLARY: 190 mg/dL — AB (ref 65–99)
GLUCOSE-CAPILLARY: 314 mg/dL — AB (ref 65–99)
GLUCOSE-CAPILLARY: 367 mg/dL — AB (ref 65–99)
Glucose-Capillary: 69 mg/dL (ref 65–99)

## 2016-06-11 MED ORDER — METOLAZONE 5 MG PO TABS
5.0000 mg | ORAL_TABLET | Freq: Once | ORAL | Status: AC
  Administered 2016-06-11: 5 mg via ORAL
  Filled 2016-06-11: qty 1

## 2016-06-11 MED ORDER — WARFARIN - PHYSICIAN DOSING INPATIENT: Freq: Every day | Status: DC

## 2016-06-11 MED ORDER — INSULIN DETEMIR 100 UNIT/ML ~~LOC~~ SOLN
12.0000 [IU] | Freq: Every day | SUBCUTANEOUS | Status: DC
  Administered 2016-06-11: 12 [IU] via SUBCUTANEOUS
  Filled 2016-06-11: qty 0.12

## 2016-06-11 MED ORDER — INSULIN ASPART 100 UNIT/ML ~~LOC~~ SOLN
0.0000 [IU] | Freq: Every day | SUBCUTANEOUS | Status: DC
  Administered 2016-06-11: 5 [IU] via SUBCUTANEOUS
  Administered 2016-06-12: 3 [IU] via SUBCUTANEOUS
  Administered 2016-06-13: 4 [IU] via SUBCUTANEOUS

## 2016-06-11 MED ORDER — INSULIN DETEMIR 100 UNIT/ML ~~LOC~~ SOLN: 18.0000 [IU] | Freq: Every day | SUBCUTANEOUS | Status: DC

## 2016-06-11 MED ORDER — POTASSIUM CHLORIDE CRYS ER 20 MEQ PO TBCR
40.0000 meq | EXTENDED_RELEASE_TABLET | Freq: Once | ORAL | Status: AC
  Administered 2016-06-11: 40 meq via ORAL
  Filled 2016-06-11: qty 2

## 2016-06-11 MED ORDER — INSULIN ASPART 100 UNIT/ML ~~LOC~~ SOLN
0.0000 [IU] | Freq: Three times a day (TID) | SUBCUTANEOUS | Status: DC
  Administered 2016-06-11: 2 [IU] via SUBCUTANEOUS
  Administered 2016-06-11: 7 [IU] via SUBCUTANEOUS
  Administered 2016-06-12: 3 [IU] via SUBCUTANEOUS
  Administered 2016-06-12: 1 [IU] via SUBCUTANEOUS
  Administered 2016-06-12: 3 [IU] via SUBCUTANEOUS
  Administered 2016-06-13: 2 [IU] via SUBCUTANEOUS
  Administered 2016-06-13: 1 [IU] via SUBCUTANEOUS
  Administered 2016-06-13: 7 [IU] via SUBCUTANEOUS
  Administered 2016-06-14: 3 [IU] via SUBCUTANEOUS

## 2016-06-11 MED ORDER — AMIODARONE HCL 200 MG PO TABS
200.0000 mg | ORAL_TABLET | Freq: Two times a day (BID) | ORAL | Status: DC
  Administered 2016-06-11: 200 mg via ORAL
  Filled 2016-06-11: qty 1

## 2016-06-11 MED ORDER — AMIODARONE HCL 200 MG PO TABS
400.0000 mg | ORAL_TABLET | Freq: Two times a day (BID) | ORAL | Status: DC
  Administered 2016-06-11 – 2016-06-13 (×5): 400 mg via ORAL
  Filled 2016-06-11 (×6): qty 2

## 2016-06-11 MED ORDER — FUROSEMIDE 10 MG/ML IJ SOLN
160.0000 mg | Freq: Two times a day (BID) | INTRAVENOUS | Status: DC
  Administered 2016-06-11 – 2016-06-13 (×4): 160 mg via INTRAVENOUS
  Filled 2016-06-11 (×4): qty 16

## 2016-06-11 MED ORDER — FUROSEMIDE 10 MG/ML IJ SOLN: 160.0000 mg | Freq: Two times a day (BID) | INTRAMUSCULAR | Status: DC

## 2016-06-11 NOTE — Procedures (Signed)
Hypoglycemic Event  CBG:40  Treatment:2 cups orange juice,graham crackers  Symptoms:weakness  Follow-up CBG: [id number] CBG Result:69  Possible Reasons for Event:high dose of insulin?,unknown  Comments/MD notified:no    Netta Corrigan, [place] [employer]

## 2016-06-11 NOTE — Progress Notes (Signed)
Patient ID: Nashae Maudlin, female   DOB: 1950-12-09, 66 y.o.   MRN: [id number] S:Feels better O:BP (!) 129/56 (BP Location: Left Arm)   Pulse 65   Temp 97.9 F (36.6 C) (Oral)   Resp 18   Ht 5\' 4"  (1.626 m)   Wt 69.4 kg (153 lb)   SpO2 98%   BMI 26.26 kg/m   Intake/Output Summary (Last 24 hours) at 06/11/16 1011 Last data filed at 06/11/16 0900  Gross per 24 hour  Intake              754 ml  Output             4450 ml  Net            -3696 ml   Intake/Output: I/O last 3 completed shifts: In: 394 [I.V.:264; IV Piggyback:130] Out: 2355 [Urine:6150]  Intake/Output this shift:  Total I/O In: 360 [P.O.:360] Out: 800 [Urine:800] Weight change: -3.221 kg (-7 lb 1.6 oz) Gen:WD WN WF in NAd CVS:no rub Resp:decreased BS at bases L>R DDU:KGURKY Ext:2+ edema   Recent Labs Lab 06/05/16 0801 06/05/16 1428 06/06/16 0408 06/07/16 0423 06/08/16 0516 06/09/16 0551 06/10/16 0533 06/10/16 1131 06/11/16 0546  NA 127*  --  128* 130* 131*  132* 129* 128*  --  131*  K 4.8  --  3.9 3.0* 3.4*  3.4* 3.8 3.5  --  3.1*  CL 92*  --  93* 94* 93*  94* 90* 87*  --  87*  CO2 24  --  25 29 28  26 31 28   --  33*  GLUCOSE 136*  --  164* 91 122*  120* 183* 315*  --  54*  BUN 118*  --  81* 45* 51*  51* 60* 67*  --  70*  CREATININE 5.16*  --  3.95* 2.72* 2.85*  2.81* 2.92* 2.96*  --  2.83*  ALBUMIN 2.5*  --  2.2* 2.1* 2.3* 2.4* 2.3* 2.3* 2.6*  CALCIUM 8.3*  --  7.6* 7.6* 7.8*  7.9* 8.2* 8.3*  --  8.7*  PHOS 5.7*  --  4.7* 3.6 3.5 3.6  --  4.2 4.8*  AST  --   --   --   --   --   --  27  --   --   ALT  --  34  --   --   --   --  31  --   --    Liver Function Tests:  Recent Labs Lab 06/05/16 1428  06/10/16 0533 06/10/16 1131 06/11/16 0546  AST  --   --  27  --   --   ALT 34  --  31  --   --   ALKPHOS  --   --  307*  --   --   BILITOT  --   --  0.5  --   --   PROT  --   --  5.5*  --   --   ALBUMIN  --   < > 2.3* 2.3* 2.6*  < > = values in this interval not displayed. No results  for input(s): LIPASE, AMYLASE in the last 168 hours. No results for input(s): AMMONIA in the last 168 hours. CBC:  Recent Labs Lab 06/08/16 0516 06/09/16 0552 06/10/16 0533 06/11/16 0546  WBC 13.3* 14.5* 12.9* 11.1*  HGB 9.1* 9.1* 9.2* 9.2*  HCT 27.0* 27.8* 27.3* 27.4*  MCV 79.2 79.2 78.9 78.7  PLT 443* 444* 416* 422*  Cardiac Enzymes: No results for input(s): CKTOTAL, CKMB, CKMBINDEX, TROPONINI in the last 168 hours. CBG:  Recent Labs Lab 06/10/16 1121 06/10/16 1609 06/10/16 2002 06/11/16 0616 06/11/16 0655  GLUCAP 155* 241* 296* 40* 69    Iron Studies: No results for input(s): IRON, TIBC, TRANSFERRIN, FERRITIN in the last 72 hours. Studies/Results: Dg Chest 2 View  Result Date: 06/11/2016 CLINICAL DATA:  Status post CABG on May 27, 2016 EXAM: CHEST  2 VIEW COMPARISON:  PA chest x-ray dated June 04, 2016 FINDINGS: The lungs remain mildly hypoinflated. Small bilateral pleural effusions persist. Bibasilar atelectasis persist greatest on the left. A small air bronchogram is visible in the left lower lobe. The cardiac silhouette remains enlarged. The pulmonary vascularity is less engorged. The sternal wires are intact. The right-sided PICC line tip projects over the distal third of the SVC. IMPRESSION: Left lower lobe pneumonia. Bibasilar atelectasis, stable. Small bilateral pleural effusions, also stable. Slight interval improvement in pulmonary vascular congestion. Electronically Signed   By: David  Martinique M.D.   On: 06/11/2016 07:17   . alteplase  2 mg Intracatheter Once  . amiodarone  200 mg Oral BID  . atorvastatin  40 mg Oral q1800  . bisacodyl  10 mg Oral Daily   Or  . bisacodyl  10 mg Rectal Daily  . docusate sodium  200 mg Oral Daily  . furosemide  160 mg Intravenous Q8H  . guaiFENesin  600 mg Oral BID  . insulin aspart  0-5 Units Subcutaneous QHS  . insulin aspart  0-9 Units Subcutaneous TID WC  . insulin detemir  12 Units Subcutaneous QHS  . lactulose   20 g Oral Once  . metoCLOPramide (REGLAN) injection  5 mg Intravenous Q6H  . pantoprazole  40 mg Oral Daily  . Warfarin - Physician Dosing Inpatient   Does not apply q1800    BMET    Component Value Date/Time   NA 131 (L) 06/11/2016 0546   K 3.1 (L) 06/11/2016 0546   CL 87 (L) 06/11/2016 0546   CO2 33 (H) 06/11/2016 0546   GLUCOSE 54 (L) 06/11/2016 0546   BUN 70 (H) 06/11/2016 0546   CREATININE 2.83 (H) 06/11/2016 0546   CALCIUM 8.7 (L) 06/11/2016 0546   GFRNONAA 17 (L) 06/11/2016 0546   GFRAA 19 (L) 06/11/2016 0546   CBC    Component Value Date/Time   WBC 11.1 (H) 06/11/2016 0546   RBC 3.48 (L) 06/11/2016 0546   HGB 9.2 (L) 06/11/2016 0546   HCT 27.4 (L) 06/11/2016 0546   PLT 422 (H) 06/11/2016 0546   MCV 78.7 06/11/2016 0546   MCH 26.4 06/11/2016 0546   MCHC 33.6 06/11/2016 0546   RDW 16.6 (H) 06/11/2016 0546   LYMPHSABS 0.6 (L) 04/29/2016 1031   MONOABS 0.7 04/29/2016 1031   EOSABS 0.1 04/29/2016 1031   BASOSABS 0.1 04/29/2016 1031     Assessment/Plan:  1. AKI/CKD stage 3 in setting of ischemic ATN following CABG. Baseline Scr 1.4-1.7 pre-op and peaked at 5.16. S/p HD on 12/27 and 12/28. Scr hovering around 2.8-2.9. Good UOP. 1. S/p removal of temp HD catheter 06/10/16. 2. No indication for further dialysis at this time. 2. NSTEMI with CAD s/p CABG 05/27/16- 3 vessel doing well 3. Ischemic cardiomyopathy (EF 20%)- on milrinone and amiodarone 1. marked diuresis over the last 48 hours with addition of metolazone.   2. Continue to follow and cont with high dose Lasix, IV but since she is negative over 4 liters will  decrease frequency of lasix to bid.  4. Hyponatremia- due to chf/volume xs 5. ABLA- follow and transfuse prn.  6. DM- poorly controlled 7. HTN- stable  Donetta Potts, MD [employer] [phone]

## 2016-06-11 NOTE — Care Management Note (Addendum)
Case Management Note  Patient Details  Name: Theresa Rogers MRN: [id number] Date of Birth: 1950-12-17  Subjective/Objective:   Pt is s/p CABG                 Action/Plan:  Pt alert and oriented during assessment.  PTA pt independent from home with adult daughter.     Expected Discharge Date:                  Expected Discharge Plan:  [place]  In-House Referral:     Discharge planning Services  CM Consult  Post Acute Care Choice:    Choice offered to:     DME Arranged:    DME Agency:     HH Arranged:    [place] Agency:     Status of Service:  In process, will continue to follow  If discussed at Long Length of Stay Meetings, dates discussed:    Additional Comments: Pt remains on milrinone, IV lasix and metolazone.  Pt will also be new HD pt.  CM will continue to follow for discharge needs Maryclare Labrador, RN 06/11/2016, 10:26 AM

## 2016-06-11 NOTE — Progress Notes (Addendum)
      [address]       [place], [address]             [phone]        15 Days Post-Op Procedure(s) (LRB): CORONARY ARTERY BYPASS GRAFTING (CABG) x3 using left internal mammary artery and left greater saphenous vein harvested endoscopically (N/A) TRANSESOPHAGEAL ECHOCARDIOGRAM (TEE) (N/A)  Subjective: Patient's states blood sugar was low again this am.  Objective: Vital signs in last 24 hours: Temp:  [97.6 F (36.4 C)-97.9 F (36.6 C)] 97.9 F (36.6 C) (01/02 0534) Pulse Rate:  [64-65] 65 (01/02 0534) Cardiac Rhythm: Normal sinus rhythm (01/01 1930) Resp:  [12-18] 18 (01/02 0534) BP: (129-140)/(54-62) 129/56 (01/02 0534) SpO2:  [96 %-98 %] 98 % (01/02 0534) Weight:  [153 lb (69.4 kg)] 153 lb (69.4 kg) (01/02 0538)  Pre op weight 65 kg Current Weight  06/11/16 153 lb (69.4 kg)      Intake/Output from previous day: 01/01 0701 - 01/02 0700 In: 394 [I.V.:264; IV Piggyback:130] Out: 4650 [Urine:4650]   Physical Exam:  Cardiovascular: IRRR IRRR Pulmonary: Diminished at bases Abdomen: Soft, non tender, bowel sounds present. Extremities: ++ bilateral lower extremity edema. Wounds: Clean and dry.  No erythema or signs of infection.  Lab Results: CBC:  Recent Labs  06/10/16 0533 06/11/16 0546  WBC 12.9* 11.1*  HGB 9.2* 9.2*  HCT 27.3* 27.4*  PLT 416* 422*   BMET:   Recent Labs  06/10/16 0533 06/11/16 0546  NA 128* 131*  K 3.5 3.1*  CL 87* 87*  CO2 28 33*  GLUCOSE 315* 54*  BUN 67* 70*  CREATININE 2.96* 2.83*  CALCIUM 8.3* 8.7*    PT/INR:  Lab Results  Component Value Date   INR 3.46 06/11/2016   INR 3.18 06/10/2016   INR 2.23 06/09/2016   ABG:  INR: Will add last result for INR, ABG once components are confirmed Will add last 4 CBG results once components are confirmed  Assessment/Plan:  1. CV - In a fib with a CVR.  Continue Amiodarone and Milrinone drips. Co ox 56.9 this am. Also, on  Coumadin. INR up 3.46. No  Coumadin again tonight. Heart failure following. 2.  Pulmonary - On room air. Encourage incentive spirometer. 3.  Acute blood loss anemia - Last H and H stable at 9.2 and 27.4 Had transfusion. 4. DM-CBGs 296/40/69. On Insulin but will decrease to avoid further hypoglycemia. On Pre op HGA1C 11.3. Will need close medical follow up after discharge. 5. Acute on chronic renal failure-Creatinine down to 2.83. Had HD yesterday and is scheduled for HD today and in the am 6. Volume Overload-To be given Metolazone 5 mg followed by 160 mg IV tid 7. Gently supplement potassium  Theresa Rogers-C 06/11/2016,7:16 AM  Wean milrinone per HF service Convert to po amio Coumadin for inr goal 2.0-2.5 for postop afib patient examined and medical record reviewed,agree with above note. Theresa Rogers 06/11/2016

## 2016-06-11 NOTE — Progress Notes (Signed)
Patient ID: Theresa Rogers, female   DOB: 1950-09-16, 66 y.o.   MRN: [id number]    Advanced Heart Failure Rounding Note   Subjective:    Transferred from [facility] with volume overload despite IV diuresis + milrinone.   Now s/p CABG x 3 on 05/27/16  Milrinone and IV lasix restarted 12/23, required initiation of CVVH then HD.  She remains on milrinone 0.25.  Co-ox 57%r today.  Brisk diuresis noted. Weight down 7 lbs.  Creatinine coming down 2.96>2.8   Denies SOB. Complaining of leg edema.      Objective:   Weight Range:  Vital Signs:   Temp:  [97.6 F (36.4 C)-97.9 F (36.6 C)] 97.9 F (36.6 C) (01/02 0534) Pulse Rate:  [64-65] 65 (01/02 0534) Resp:  [12-18] 18 (01/02 0534) BP: (129-140)/(54-62) 129/56 (01/02 0534) SpO2:  [96 %-98 %] 98 % (01/02 0534) Weight:  [153 lb (69.4 kg)] 153 lb (69.4 kg) (01/02 0538) Last BM Date: 06/10/16  Weight change: Filed Weights   06/09/16 0415 06/10/16 0446 06/11/16 0538  Weight: 163 lb 9.6 oz (74.2 kg) 160 lb 1.6 oz (72.6 kg) 153 lb (69.4 kg)    Intake/Output:   Intake/Output Summary (Last 24 hours) at 06/11/16 0713 Last data filed at 06/11/16 0657  Gross per 24 hour  Intake              394 ml  Output             4650 ml  Net            -4256 ml     Physical Exam: General:  Sitting on the side of the bed. NAD HEENT: Normal Neck: supple. JVP ~10+ cm. Carotids 2+ bilat; no bruits. No thyromegaly or nodule noted.  Cor: Irregular S1S2, no murmur, no S3/S4 Lungs: Clear but diminished basilar sounds.  Abdomen: soft, NT, ND, no HSM. No bruits or masses. +BS  Extremities: no cyanosis, clubbing, rash, R and LLE ted hose 2-3+ edema  Neuro: alert & orientedx3, cranial nerves grossly intact. moves all 4 extremities w/o difficulty. Affect pleasant  Telemetry: Reviewed, atrial fibrillation in 80s    Labs: Basic Metabolic Panel:  Recent Labs Lab 06/07/16 0423 06/08/16 0516 06/09/16 0551 06/10/16 0533 06/10/16 1131 06/11/16 0546  NA  130* 131*  132* 129* 128*  --  131*  K 3.0* 3.4*  3.4* 3.8 3.5  --  3.1*  CL 94* 93*  94* 90* 87*  --  87*  CO2 29 28  26 31 28   --  33*  GLUCOSE 91 122*  120* 183* 315*  --  54*  BUN 45* 51*  51* 60* 67*  --  70*  CREATININE 2.72* 2.85*  2.81* 2.92* 2.96*  --  2.83*  CALCIUM 7.6* 7.8*  7.9* 8.2* 8.3*  --  8.7*  PHOS 3.6 3.5 3.6  --  4.2 4.8*    Liver Function Tests:  Recent Labs Lab 06/04/16 0834  06/05/16 1428  06/08/16 0516 06/09/16 0551 06/10/16 0533 06/10/16 1131 06/11/16 0546  AST 31  --   --   --   --   --  27  --   --   ALT 36  --  34  --   --   --  31  --   --   ALKPHOS 373*  --   --   --   --   --  307*  --   --   BILITOT 0.5  --   --   --   --   --  0.5  --   --   PROT 5.9*  --   --   --   --   --  5.5*  --   --   ALBUMIN 2.6*  < >  --   < > 2.3* 2.4* 2.3* 2.3* 2.6*  < > = values in this interval not displayed. No results for input(s): LIPASE, AMYLASE in the last 168 hours. No results for input(s): AMMONIA in the last 168 hours.  CBC:  Recent Labs Lab 06/04/16 0834 06/08/16 0516 06/09/16 0552 06/10/16 0533 06/11/16 0546  WBC 11.6* 13.3* 14.5* 12.9* 11.1*  HGB 9.5* 9.1* 9.1* 9.2* 9.2*  HCT 28.1* 27.0* 27.8* 27.3* 27.4*  MCV 79.2 79.2 79.2 78.9 78.7  PLT 532* 443* 444* 416* 422*    Cardiac Enzymes: No results for input(s): CKTOTAL, CKMB, CKMBINDEX, TROPONINI in the last 168 hours.  BNP: BNP (last 3 results)  Recent Labs  06/19/15 0923 05/13/16 0905 06/04/16 0834  BNP 1,782.0* 2,790.0* 3,366.3*    ProBNP (last 3 results) No results for input(s): PROBNP in the last 8760 hours.    Other results:  Imaging: No results found.   Medications:     Scheduled Medications: . alteplase  2 mg Intracatheter Once  . atorvastatin  40 mg Oral q1800  . bisacodyl  10 mg Oral Daily   Or  . bisacodyl  10 mg Rectal Daily  . docusate sodium  200 mg Oral Daily  . furosemide  160 mg Intravenous Q8H  . guaiFENesin  600 mg Oral BID  . insulin  aspart  4 Units Subcutaneous TID WC  . insulin detemir  25 Units Subcutaneous QHS  . lactulose  20 g Oral Once  . metoCLOPramide (REGLAN) injection  5 mg Intravenous Q6H  . pantoprazole  40 mg Oral Daily  . Warfarin - Physician Dosing Inpatient   Does not apply q1800    Infusions: . amiodarone 30 mg/hr (06/11/16 0126)  . milrinone 0.25 mcg/kg/min (06/11/16 0541)    PRN Medications: guaiFENesin-dextromethorphan, lidocaine (PF), ondansetron (ZOFRAN) IV, oxyCODONE, promethazine, sodium chloride flush, traMADol   Assessment:   1. A/C Systolic Heart Failure: Ischemic cardiomyopathy, EF 20% with mild LV dilation on 12/17 echo.  2. NSTEMI with CAD-severe 2 vessel disease LAD RCA    - s/p CABG 12/18 with LIMA-LAD, SVG-RCA and SVG-diagonal 3. AKI on CKD: Required CVVH then HD.  4. Uncontrolled DM- Hgb AC 11.3  5. Hyperkalemia  6. Elevated TSH 7. COPD  8. PAF: on amiodarone.  9. Hypokalemia  10. Hyponatremia  Plan/Discussion:    She was on CVVH, now has had 2 sessions of HD but none since 12/28.  Temp HD cath removed 1/1. Brisk diuresis noted. Volume status  improving. Will need another day of IV lasix + metolazone. Will gently replace K.   Co-ox 57% early this am on milrinone 0.25. Once she is better-diuresed, will start titrating down on milrinone slowly.    No ACE/ARB with AKI. No b-blocker with low output. Continue statin.     In  A Fib with controlled rate. Continue IV amio. Hopefully if milrinone can be weaned this will improve.   INR supratherapeutic.  Continue to load amiodarone IV.  Ranolazine is a consideration but would like to see renal function stabilized before using.   Length of Stay: [place], NP  06/11/2016, 7:13 AM  Advanced Heart Failure Team Pager [phone] (M-F; [place])  Please contact [facility] for night-coverage after hours (4p -  7a ) and weekends on [email or web address]  Patient seen with NP, agree with the above note.  Good diuresis yesterday though she  remains volume overloaded.  Renal function appears to have plateaued.  Co-ox marginal but acceptable.  - Continue IV Lasix + dose of metolazone again today.   May be able to attempt slow titration of milrinone tomorrow if she diureses well again.   Back in atrial fibrillation, rate controlled.  She was transitioned to po amiodarone.  Would use 400 mg bid.  She is on warfarin.   Loralie Champagne 06/11/2016 1:07 PM

## 2016-06-11 NOTE — Progress Notes (Signed)
CARDIAC REHAB PHASE I   PRE:  Rate/Rhythm: 103 a fib  BP:  Sitting: 117/77        SaO2: 96 AR  MODE:  Ambulation: 300 ft   POST:  Rate/Rhythm: 98 a fib  BP:  Sitting: 115/56         SaO2: 95 RA  Pt ready to walk. Pt ambulated 300 ft on RA, IV, assist x1 to push IV pole, mostly steady gait, tolerated well with no complaints. Pt did have occasional mild loss of balance, pt states she is used to pushing the IV pole, states she felt "a little wobbly" without it. Encouraged additional ambulation today. Pt to edge of bed after walk, call bell within reach. Will follow.   [id number] Lenna Sciara, RN, BSN 06/11/2016 11:25 AM

## 2016-06-12 ENCOUNTER — Inpatient Hospital Stay (HOSPITAL_COMMUNITY): Payer: Medicaid Other

## 2016-06-12 DIAGNOSIS — I517 Cardiomegaly: Secondary | ICD-10-CM

## 2016-06-12 LAB — RENAL FUNCTION PANEL
ANION GAP: 12 (ref 5–15)
Albumin: 2.6 g/dL — ABNORMAL LOW (ref 3.5–5.0)
BUN: 81 mg/dL — ABNORMAL HIGH (ref 6–20)
CALCIUM: 8.7 mg/dL — AB (ref 8.9–10.3)
CO2: 34 mmol/L — ABNORMAL HIGH (ref 22–32)
Chloride: 86 mmol/L — ABNORMAL LOW (ref 101–111)
Creatinine, Ser: 3.39 mg/dL — ABNORMAL HIGH (ref 0.44–1.00)
GFR, EST AFRICAN AMERICAN: 15 mL/min — AB (ref >60)
GFR, EST NON AFRICAN AMERICAN: 13 mL/min — AB (ref >60)
Glucose, Bld: 245 mg/dL — ABNORMAL HIGH (ref 65–99)
PHOSPHORUS: 4.6 mg/dL (ref 2.5–4.6)
Potassium: 3.4 mmol/L — ABNORMAL LOW (ref 3.5–5.1)
SODIUM: 132 mmol/L — AB (ref 135–145)

## 2016-06-12 LAB — GLUCOSE, CAPILLARY
GLUCOSE-CAPILLARY: 216 mg/dL — AB (ref 65–99)
GLUCOSE-CAPILLARY: 297 mg/dL — AB (ref 65–99)
Glucose-Capillary: 149 mg/dL — ABNORMAL HIGH (ref 65–99)
Glucose-Capillary: 220 mg/dL — ABNORMAL HIGH (ref 65–99)

## 2016-06-12 LAB — COOXEMETRY PANEL
Carboxyhemoglobin: 1.7 % — ABNORMAL HIGH (ref 0.5–1.5)
Methemoglobin: 0.9 % (ref 0.0–1.5)
O2 Saturation: 60.3 %
Total hemoglobin: 9.1 g/dL — ABNORMAL LOW (ref 12.0–16.0)

## 2016-06-12 LAB — ECHOCARDIOGRAM COMPLETE
Height: 64 in
Weight: 2435.2 oz

## 2016-06-12 LAB — PROTIME-INR
INR: 3.03
Prothrombin Time: 32 seconds — ABNORMAL HIGH (ref 11.4–15.2)

## 2016-06-12 MED ORDER — INSULIN DETEMIR 100 UNIT/ML ~~LOC~~ SOLN
16.0000 [IU] | Freq: Every day | SUBCUTANEOUS | Status: DC
  Administered 2016-06-12 – 2016-06-13 (×2): 16 [IU] via SUBCUTANEOUS
  Filled 2016-06-12 (×2): qty 0.16

## 2016-06-12 MED ORDER — CEPHALEXIN 500 MG PO CAPS
500.0000 mg | ORAL_CAPSULE | Freq: Three times a day (TID) | ORAL | Status: DC
  Administered 2016-06-12 – 2016-06-14 (×8): 500 mg via ORAL
  Filled 2016-06-12 (×9): qty 1

## 2016-06-12 MED ORDER — AMIODARONE IV BOLUS ONLY 150 MG/100ML
150.0000 mg | Freq: Once | INTRAVENOUS | Status: AC
  Administered 2016-06-12: 150 mg via INTRAVENOUS
  Filled 2016-06-12: qty 100

## 2016-06-12 MED ORDER — AMIODARONE LOAD VIA INFUSION
150.0000 mg | Freq: Once | INTRAVENOUS | Status: DC
  Filled 2016-06-12: qty 83.34

## 2016-06-12 MED ORDER — MILRINONE LACTATE IN DEXTROSE 20-5 MG/100ML-% IV SOLN
0.1250 ug/kg/min | INTRAVENOUS | Status: DC
  Administered 2016-06-13: 0.125 ug/kg/min via INTRAVENOUS
  Filled 2016-06-12: qty 100

## 2016-06-12 NOTE — Progress Notes (Signed)
Patient ambulated in hallway independently back in room will monitor patient. Yumiko Alkins, Bettina Gavia RN

## 2016-06-12 NOTE — Progress Notes (Signed)
Echocardiogram 2D Echocardiogram has been performed.  Theresa Rogers 06/12/2016, 3:10 PM

## 2016-06-12 NOTE — Progress Notes (Signed)
Patient ID: Theresa Rogers, female   DOB: [birth year], 66 y.o.   MRN: [id number] S:Feels better- says swelling is better- has put out 8.5 liters last 48 hours- crt did bump today some   O:BP 134/62 (BP Location: Left Arm)   Pulse 68   Temp 97.7 F (36.5 C) (Oral)   Resp 20   Ht 5\' 4"  (1.626 m)   Wt 69 kg (152 lb 3.2 oz)   SpO2 93%   BMI 26.13 kg/m   Intake/Output Summary (Last 24 hours) at 06/12/16 1107 Last data filed at 06/12/16 0949  Gross per 24 hour  Intake              800 ml  Output             3900 ml  Net            -3100 ml   Intake/Output: I/O last 3 completed shifts: In: 5009 [P.O.:1160; I.V.:264; IV Piggyback:130] Out: 3818 [Urine:7150]  Intake/Output this shift:  Total I/O In: -  Out: 800 [Urine:800] Weight change: -0.363 kg (-12.8 oz) Gen:WD WN WF in NAd CVS:no rub Resp:decreased BS at bases L>R EXH:BZJIRC Ext:2+ edema- but only to knees    Recent Labs Lab 06/05/16 1428  06/06/16 0408 06/07/16 0423 06/08/16 0516 06/09/16 0551 06/10/16 0533 06/10/16 1131 06/11/16 0546 06/11/16 1129 06/12/16 0423  NA  --   < > 128* 130* 131*  132* 129* 128*  --  131* 130* 132*  K  --   < > 3.9 3.0* 3.4*  3.4* 3.8 3.5  --  3.1* 3.5 3.4*  CL  --   < > 93* 94* 93*  94* 90* 87*  --  87* 85* 86*  CO2  --   < > 25 29 28  26 31 28   --  33* 30 34*  GLUCOSE  --   < > 164* 91 122*  120* 183* 315*  --  54* 222* 245*  BUN  --   < > 81* 45* 51*  51* 60* 67*  --  70* 73* 81*  CREATININE  --   < > 3.95* 2.72* 2.85*  2.81* 2.92* 2.96*  --  2.83* 2.89* 3.39*  ALBUMIN  --   < > 2.2* 2.1* 2.3* 2.4* 2.3* 2.3* 2.6*  --  2.6*  CALCIUM  --   < > 7.6* 7.6* 7.8*  7.9* 8.2* 8.3*  --  8.7* 8.4* 8.7*  PHOS  --   --  4.7* 3.6 3.5 3.6  --  4.2 4.8*  --  4.6  AST  --   --   --   --   --   --  27  --   --   --   --   ALT 34  --   --   --   --   --  31  --   --   --   --   < > = values in this interval not displayed. Liver Function Tests:  Recent Labs Lab 06/05/16 1428   06/10/16 0533 06/10/16 1131 06/11/16 0546 06/12/16 0423  AST  --   --  27  --   --   --   ALT 34  --  31  --   --   --   ALKPHOS  --   --  307*  --   --   --   BILITOT  --   --  0.5  --   --   --  PROT  --   --  5.5*  --   --   --   ALBUMIN  --   < > 2.3* 2.3* 2.6* 2.6*  < > = values in this interval not displayed. No results for input(s): LIPASE, AMYLASE in the last 168 hours. No results for input(s): AMMONIA in the last 168 hours. CBC:  Recent Labs Lab 06/08/16 0516 06/09/16 0552 06/10/16 0533 06/11/16 0546  WBC 13.3* 14.5* 12.9* 11.1*  HGB 9.1* 9.1* 9.2* 9.2*  HCT 27.0* 27.8* 27.3* 27.4*  MCV 79.2 79.2 78.9 78.7  PLT 443* 444* 416* 422*   Cardiac Enzymes: No results for input(s): CKTOTAL, CKMB, CKMBINDEX, TROPONINI in the last 168 hours. CBG:  Recent Labs Lab 06/11/16 1110 06/11/16 1612 06/11/16 2110 06/12/16 0617 06/12/16 1055  GLUCAP 190* 314* 367* 216* 149*    Iron Studies: No results for input(s): IRON, TIBC, TRANSFERRIN, FERRITIN in the last 72 hours. Studies/Results: Dg Chest 2 View  Result Date: 06/11/2016 CLINICAL DATA:  Status post CABG on May 27, 2016 EXAM: CHEST  2 VIEW COMPARISON:  PA chest x-ray dated June 04, 2016 FINDINGS: The lungs remain mildly hypoinflated. Small bilateral pleural effusions persist. Bibasilar atelectasis persist greatest on the left. A small air bronchogram is visible in the left lower lobe. The cardiac silhouette remains enlarged. The pulmonary vascularity is less engorged. The sternal wires are intact. The right-sided PICC line tip projects over the distal third of the SVC. IMPRESSION: Left lower lobe pneumonia. Bibasilar atelectasis, stable. Small bilateral pleural effusions, also stable. Slight interval improvement in pulmonary vascular congestion. Electronically Signed   By: David  Martinique M.D.   On: 06/11/2016 07:17   . alteplase  2 mg Intracatheter Once  . amiodarone  150 mg Intravenous Once  . amiodarone  400 mg  Oral BID  . atorvastatin  40 mg Oral q1800  . bisacodyl  10 mg Oral Daily   Or  . bisacodyl  10 mg Rectal Daily  . cephALEXin  500 mg Oral Q8H  . docusate sodium  200 mg Oral Daily  . furosemide  160 mg Intravenous Q12H  . guaiFENesin  600 mg Oral BID  . insulin aspart  0-5 Units Subcutaneous QHS  . insulin aspart  0-9 Units Subcutaneous TID WC  . insulin detemir  16 Units Subcutaneous QHS  . lactulose  20 g Oral Once  . metoCLOPramide (REGLAN) injection  5 mg Intravenous Q6H  . pantoprazole  40 mg Oral Daily  . Warfarin - Physician Dosing Inpatient   Does not apply q1800    BMET    Component Value Date/Time   NA 132 (L) 06/12/2016 0423   K 3.4 (L) 06/12/2016 0423   CL 86 (L) 06/12/2016 0423   CO2 34 (H) 06/12/2016 0423   GLUCOSE 245 (H) 06/12/2016 0423   BUN 81 (H) 06/12/2016 0423   CREATININE 3.39 (H) 06/12/2016 0423   CALCIUM 8.7 (L) 06/12/2016 0423   GFRNONAA 13 (L) 06/12/2016 0423   GFRAA 15 (L) 06/12/2016 0423   CBC    Component Value Date/Time   WBC 11.1 (H) 06/11/2016 0546   RBC 3.48 (L) 06/11/2016 0546   HGB 9.2 (L) 06/11/2016 0546   HCT 27.4 (L) 06/11/2016 0546   PLT 422 (H) 06/11/2016 0546   MCV 78.7 06/11/2016 0546   MCH 26.4 06/11/2016 0546   MCHC 33.6 06/11/2016 0546   RDW 16.6 (H) 06/11/2016 0546   LYMPHSABS 0.6 (L) 04/29/2016 1031   MONOABS 0.7 04/29/2016  1031   EOSABS 0.1 04/29/2016 1031   BASOSABS 0.1 04/29/2016 1031     Assessment/Plan:  1. AKI/CKD stage 3 in setting of ischemic ATN following CABG.  Follows with Dr. Isaias Cowman in [place] as OP. Baseline Scr 1.4-1.7 pre-op and peaked at 5.16. S/p IHD on 12/27 and 12/28. Scr hovering around 2.8-2.9 for last several days. Excellent UOP.  temp HD catheter removed 06/10/16. She continued to diurese very well.  I think the latest bump in creatinine was just too much diuresis in a short amount of time- 8.5 liters in 48 hours.  Her metolazone has been stopped and her lasix decreased slightly- hopefully  we will see some settling out of renal function.  I agree she still needs more out that in but I think was too aggressive.  Patient only wants dialysis as A LAST RESORT and she is not open to it at this time.  Understands that watching her kidney function may keep her in the hospital longer.  2. NSTEMI with CAD s/p CABG 05/27/16- 3 vessel doing well 3. Ischemic cardiomyopathy (EF 20%)- on milrinone and amiodarone 1. marked diuresis over the last 48 hours with addition of metolazone- now stopped.   2. Continue to follow and cont with relatively high dose Lasix, IV but since she is negative over 4 liters have decreased frequency of lasix to bid.  4. Hyponatremia- due to chf/volume xs- better 5. ABLA- follow and transfuse prn. hgb now 9.2- will check iron stores in AM 6. DM- poorly controlled 7. HTN- stable  Dalten Ambrosino A  [employer] [phone]

## 2016-06-12 NOTE — Progress Notes (Addendum)
Patient ID: Theresa Rogers, female   DOB: 1951-06-05, 66 y.o.   MRN: [id number]    Advanced Heart Failure Rounding Note   Subjective:    Transferred from [facility] with volume overload despite IV diuresis + milrinone.   Now s/p CABG x 3 on 05/27/16  Milrinone and IV lasix restarted 12/23, required initiation of CVVH then HD.  She remains on milrinone 0.25.  Co-ox 60% today.  Weight down 1 pound with IV lasix and metlalzone. Creatinine back up to 3.4  Back in AF around 100. Wants to go home.     Objective:   Weight Range:  Vital Signs:   Temp:  [97.6 F (36.4 C)-97.8 F (36.6 C)] 97.7 F (36.5 C) (01/03 0449) Pulse Rate:  [62-75] 68 (01/03 0449) Resp:  [18-20] 20 (01/03 0449) BP: (125-155)/(62-94) 134/62 (01/03 0449) SpO2:  [93 %-100 %] 93 % (01/03 0449) Weight:  [69 kg (152 lb 3.2 oz)] 69 kg (152 lb 3.2 oz) (01/03 0449) Last BM Date: 06/11/16  Weight change: Filed Weights   06/10/16 0446 06/11/16 0538 06/12/16 0449  Weight: 72.6 kg (160 lb 1.6 oz) 69.4 kg (153 lb) 69 kg (152 lb 3.2 oz)    Intake/Output:   Intake/Output Summary (Last 24 hours) at 06/12/16 0747 Last data filed at 06/12/16 0511  Gross per 24 hour  Intake              800 ml  Output             3900 ml  Net            -3100 ml     Physical Exam: General:  Sitting on the side of the bed. NAD HEENT: Normal Neck: supple. JVP ~10+ cm. Carotids 2+ bilat; no bruits. No thyromegaly or nodule noted.  Cor: Irregular S1S2, no murmur, no S3/S4. Scar healing well.  Lungs: Clear but diminished basilar sounds.  Abdomen: soft, NT, ND, no HSM. No bruits or masses. +BS  Extremities: no cyanosis, clubbing, rash, R and LLE  2-3+ edema  Neuro: alert & orientedx3, cranial nerves grossly intact. moves all 4 extremities w/o difficulty. Affect pleasant  Telemetry: Reviewed, atrial fibrillation in 80s    Labs: Basic Metabolic Panel:  Recent Labs Lab 06/08/16 0516 06/09/16 0551 06/10/16 0533 06/10/16 1131  06/11/16 0546 06/11/16 1129 06/12/16 0423  NA 131*  132* 129* 128*  --  131* 130* 132*  K 3.4*  3.4* 3.8 3.5  --  3.1* 3.5 3.4*  CL 93*  94* 90* 87*  --  87* 85* 86*  CO2 28  26 31 28   --  33* 30 34*  GLUCOSE 122*  120* 183* 315*  --  54* 222* 245*  BUN 51*  51* 60* 67*  --  70* 73* 81*  CREATININE 2.85*  2.81* 2.92* 2.96*  --  2.83* 2.89* 3.39*  CALCIUM 7.8*  7.9* 8.2* 8.3*  --  8.7* 8.4* 8.7*  PHOS 3.5 3.6  --  4.2 4.8*  --  4.6    Liver Function Tests:  Recent Labs Lab 06/05/16 1428  06/09/16 0551 06/10/16 0533 06/10/16 1131 06/11/16 0546 06/12/16 0423  AST  --   --   --  27  --   --   --   ALT 34  --   --  31  --   --   --   ALKPHOS  --   --   --  307*  --   --   --  BILITOT  --   --   --  0.5  --   --   --   PROT  --   --   --  5.5*  --   --   --   ALBUMIN  --   < > 2.4* 2.3* 2.3* 2.6* 2.6*  < > = values in this interval not displayed. No results for input(s): LIPASE, AMYLASE in the last 168 hours. No results for input(s): AMMONIA in the last 168 hours.  CBC:  Recent Labs Lab 06/08/16 0516 06/09/16 0552 06/10/16 0533 06/11/16 0546  WBC 13.3* 14.5* 12.9* 11.1*  HGB 9.1* 9.1* 9.2* 9.2*  HCT 27.0* 27.8* 27.3* 27.4*  MCV 79.2 79.2 78.9 78.7  PLT 443* 444* 416* 422*    Cardiac Enzymes: No results for input(s): CKTOTAL, CKMB, CKMBINDEX, TROPONINI in the last 168 hours.  BNP: BNP (last 3 results)  Recent Labs  06/19/15 0923 05/13/16 0905 06/04/16 0834  BNP 1,782.0* 2,790.0* 3,366.3*    ProBNP (last 3 results) No results for input(s): PROBNP in the last 8760 hours.    Other results:  Imaging: Dg Chest 2 View  Result Date: 06/11/2016 CLINICAL DATA:  Status post CABG on May 27, 2016 EXAM: CHEST  2 VIEW COMPARISON:  PA chest x-ray dated June 04, 2016 FINDINGS: The lungs remain mildly hypoinflated. Small bilateral pleural effusions persist. Bibasilar atelectasis persist greatest on the left. A small air bronchogram is visible in the  left lower lobe. The cardiac silhouette remains enlarged. The pulmonary vascularity is less engorged. The sternal wires are intact. The right-sided PICC line tip projects over the distal third of the SVC. IMPRESSION: Left lower lobe pneumonia. Bibasilar atelectasis, stable. Small bilateral pleural effusions, also stable. Slight interval improvement in pulmonary vascular congestion. Electronically Signed   By: David  Martinique M.D.   On: 06/11/2016 07:17     Medications:     Scheduled Medications: . alteplase  2 mg Intracatheter Once  . amiodarone  400 mg Oral BID  . atorvastatin  40 mg Oral q1800  . bisacodyl  10 mg Oral Daily   Or  . bisacodyl  10 mg Rectal Daily  . docusate sodium  200 mg Oral Daily  . furosemide  160 mg Intravenous Q12H  . guaiFENesin  600 mg Oral BID  . insulin aspart  0-5 Units Subcutaneous QHS  . insulin aspart  0-9 Units Subcutaneous TID WC  . insulin detemir  16 Units Subcutaneous QHS  . lactulose  20 g Oral Once  . metoCLOPramide (REGLAN) injection  5 mg Intravenous Q6H  . pantoprazole  40 mg Oral Daily  . Warfarin - Physician Dosing Inpatient   Does not apply q1800    Infusions: . milrinone 0.25 mcg/kg/min (06/11/16 2351)    PRN Medications: guaiFENesin-dextromethorphan, lidocaine (PF), ondansetron (ZOFRAN) IV, oxyCODONE, promethazine, sodium chloride flush, traMADol   Assessment:   1. A/C Systolic Heart Failure: Ischemic cardiomyopathy, EF 20% with mild LV dilation on 12/17 echo.  2. NSTEMI with CAD-severe 2 vessel disease LAD RCA    - s/p CABG 12/18 with LIMA-LAD, SVG-RCA and SVG-diagonal 3. AKI on CKD: Required CVVH then HD.  4. Uncontrolled DM- Hgb AC 11.3  5. Hyperkalemia  6. Elevated TSH 7. COPD  8. PAF: on amiodarone.  9. Hypokalemia  10. Hyponatremia  Plan/Discussion:    She was on CVVH, now has had 2 sessions of HD but none since 12/28.  Temp HD cath removed 1/1.  Diuresis has slowed down and  creatinine back up significantly despite  inotrope support. Weight up 9 pounds from baseline weight. I am worried that it will be difficult for her to maintain euvolemia without dialysis at this point.Wil discuss coordinated plan with Renal.   Co-ox 60% on milrinone. Will wean to 0.125 and follow co-ox,  No ACE/ARB with AKI. No b-blocker with low output. Continue statin.     Was in NSR yesterday. Now back A Fib with controlled rate. Continue po amio. Hopefully if milrinone can be weaned this will improve.   INR therapeutic. Suspect she may need trial of Ranexa.     Length of Stay: 23  Glori Bickers, MD  06/12/2016, 7:47 AM  Advanced Heart Failure Team Pager [phone] (M-F; 7a - 4p)  Please contact [facility] for night-coverage after hours (4p -7a ) and weekends on [email or web address]

## 2016-06-12 NOTE — Progress Notes (Signed)
Patient ambulated in hallway back in room will monitor patient. Fransisca Shawn, Bettina Gavia RN

## 2016-06-12 NOTE — Progress Notes (Addendum)
      [address]       [place] [address]             [phone]        16 Days Post-Op Procedure(s) (LRB): CORONARY ARTERY BYPASS GRAFTING (CABG) x3 using left internal mammary artery and left greater saphenous vein harvested endoscopically (N/A) TRANSESOPHAGEAL ECHOCARDIOGRAM (TEE) (N/A)  Subjective: Patient really wants to go home.  Objective: Vital signs in last 24 hours: Temp:  [97.6 F (36.4 C)-97.8 F (36.6 C)] 97.7 F (36.5 C) (01/03 0449) Pulse Rate:  [62-75] 68 (01/03 0449) Cardiac Rhythm: Normal sinus rhythm (01/02 1930) Resp:  [18-20] 20 (01/03 0449) BP: (125-155)/(62-94) 134/62 (01/03 0449) SpO2:  [93 %-100 %] 93 % (01/03 0449) Weight:  [152 lb 3.2 oz (69 kg)] 152 lb 3.2 oz (69 kg) (01/03 0449)  Pre op weight 65 kg Current Weight  06/12/16 152 lb 3.2 oz (69 kg)      Intake/Output from previous day: 01/02 0701 - 01/03 0700 In: 1160 [P.O.:1160] Out: 3900 [Urine:3900]   Physical Exam:  Cardiovascular: IRRR IRRR Pulmonary: Diminished at bases Abdomen: Soft, non tender, bowel sounds present. Extremities: ++ bilateral lower extremity edema. Some erythema LE R>L Wounds: Clean and dry.  No erythema or signs of infection.  Lab Results: CBC:  Recent Labs  06/10/16 0533 06/11/16 0546  WBC 12.9* 11.1*  HGB 9.2* 9.2*  HCT 27.3* 27.4*  PLT 416* 422*   BMET:   Recent Labs  06/11/16 1129 06/12/16 0423  NA 130* 132*  K 3.5 3.4*  CL 85* 86*  CO2 30 34*  GLUCOSE 222* 245*  BUN 73* 81*  CREATININE 2.89* 3.39*  CALCIUM 8.4* 8.7*    PT/INR:  Lab Results  Component Value Date   INR 3.03 06/12/2016   INR 3.46 06/11/2016   INR 3.18 06/10/2016   ABG:  INR: Will add last result for INR, ABG once components are confirmed Will add last 4 CBG results once components are confirmed  Assessment/Plan:  1. CV - In a fib with a CVR.  On Milrinone drip. Co ox 60.3 this am. On Amiodarone 400 mg bid at QTc ok. INR down to 3.03.  Coumadin held last 24 hours. Will hold again tonight as INR goal 2-2.5. Heart failure following. 2.  Pulmonary - On room air. Encourage incentive spirometer. 3.  Acute blood loss anemia - Last H and H stable at 9.2 and 27.4 Had transfusion. 4. DM-CBGs 314/367/216. On Insulin but will increase for better glucose control. On Pre op HGA1C 11.3. Will need close medical follow up after discharge. 5.AKI/CKD stage 3 in setting of ischemic ATN-Creatinine  increased from 2.89 to 3.39. No need for HD at this time, per nephrology 6. Volume Overload-on Lasix 160 mg IV bid 7. Patient appears to have cellulitis R>L-she has a fair amount of edema. Will discuss with Dr. Prescott Gum if need to start KIeflex  ZIMMERMAN,DONIELLE MPA-C 06/12/2016,7:20 AM  Bolus iv amio for afib, cont po at 400 bid Start keflex for leg cellulitis Keep in hospital for HF and acute on chronic renal failure Check echo Hold coumadin until inr 2.5  patient examined and medical record reviewed,agree with above note. [name] 06/12/2016

## 2016-06-12 NOTE — Progress Notes (Signed)
CARDIAC REHAB PHASE I   PRE:  Rate/Rhythm: 65SR    BP: sitting 148/66    SaO2: 97 RA  MODE:  Ambulation: 550 ft   POST:  Rate/Rhythm: 68 SR    BP: sitting 128/60     SaO2: 93 RA  Tolerated well, no major c/o except general weakness. VSS. Pt asking about CRPII, will refer to [place] CRPII. Encouraged more walking later today. [place], ACSM 06/12/2016 1:52 PM

## 2016-06-13 LAB — GLUCOSE, CAPILLARY
GLUCOSE-CAPILLARY: 162 mg/dL — AB (ref 65–99)
GLUCOSE-CAPILLARY: 140 mg/dL — AB (ref 65–99)
GLUCOSE-CAPILLARY: 349 mg/dL — AB (ref 65–99)
Glucose-Capillary: 345 mg/dL — ABNORMAL HIGH (ref 65–99)
Glucose-Capillary: 432 mg/dL — ABNORMAL HIGH (ref 65–99)

## 2016-06-13 LAB — RENAL FUNCTION PANEL
Albumin: 2.4 g/dL — ABNORMAL LOW (ref 3.5–5.0)
Anion gap: 11 (ref 5–15)
BUN: 88 mg/dL — ABNORMAL HIGH (ref 6–20)
CHLORIDE: 85 mmol/L — AB (ref 101–111)
CO2: 36 mmol/L — AB (ref 22–32)
Calcium: 8.4 mg/dL — ABNORMAL LOW (ref 8.9–10.3)
Creatinine, Ser: 3.46 mg/dL — ABNORMAL HIGH (ref 0.44–1.00)
GFR calc non Af Amer: 13 mL/min — ABNORMAL LOW (ref >60)
GFR, EST AFRICAN AMERICAN: 15 mL/min — AB (ref >60)
GLUCOSE: 202 mg/dL — AB (ref 65–99)
Phosphorus: 5.1 mg/dL — ABNORMAL HIGH (ref 2.5–4.6)
Potassium: 3 mmol/L — ABNORMAL LOW (ref 3.5–5.1)
SODIUM: 132 mmol/L — AB (ref 135–145)

## 2016-06-13 LAB — PROTIME-INR
INR: 2.27
PROTHROMBIN TIME: 25.4 s — AB (ref 11.4–15.2)

## 2016-06-13 LAB — GLUCOSE, RANDOM: GLUCOSE: 381 mg/dL — AB (ref 65–99)

## 2016-06-13 LAB — COOXEMETRY PANEL
Carboxyhemoglobin: 1.3 % (ref 0.5–1.5)
Methemoglobin: 1.1 % (ref 0.0–1.5)
O2 Saturation: 64.5 %
Total hemoglobin: 9.1 g/dL — ABNORMAL LOW (ref 12.0–16.0)

## 2016-06-13 LAB — IRON AND TIBC
Iron: 26 ug/dL — ABNORMAL LOW (ref 28–170)
Saturation Ratios: 7 % — ABNORMAL LOW (ref 10.4–31.8)
TIBC: 357 ug/dL (ref 250–450)
UIBC: 331 ug/dL

## 2016-06-13 LAB — FERRITIN: FERRITIN: 176 ng/mL (ref 11–307)

## 2016-06-13 MED ORDER — INSULIN ASPART 100 UNIT/ML ~~LOC~~ SOLN
10.0000 [IU] | Freq: Once | SUBCUTANEOUS | Status: AC
  Administered 2016-06-13: 10 [IU] via SUBCUTANEOUS

## 2016-06-13 MED ORDER — WARFARIN SODIUM 2.5 MG PO TABS: 2.5000 mg | ORAL_TABLET | Freq: Once | ORAL | Status: DC

## 2016-06-13 MED ORDER — WARFARIN SODIUM 2 MG PO TABS
2.0000 mg | ORAL_TABLET | Freq: Once | ORAL | Status: AC
  Administered 2016-06-13: 2 mg via ORAL
  Filled 2016-06-13: qty 1

## 2016-06-13 MED ORDER — POTASSIUM CHLORIDE CRYS ER 20 MEQ PO TBCR
40.0000 meq | EXTENDED_RELEASE_TABLET | Freq: Once | ORAL | Status: AC
  Administered 2016-06-13: 40 meq via ORAL
  Filled 2016-06-13: qty 2

## 2016-06-13 MED ORDER — FUROSEMIDE 80 MG PO TABS
80.0000 mg | ORAL_TABLET | Freq: Two times a day (BID) | ORAL | Status: DC
  Administered 2016-06-13 – 2016-06-14 (×2): 80 mg via ORAL
  Filled 2016-06-13 (×2): qty 1

## 2016-06-13 NOTE — Progress Notes (Signed)
Patient ID: Theresa Rogers, female   DOB: May 14, 1951, 66 y.o.   MRN: [id number] S:Feels better- says swelling is better- put out another 3 liters- kidney numbers a little worse but not significantly    O:BP (!) 141/59 (BP Location: Left Arm)   Pulse 67   Temp 98.7 F (37.1 C) (Oral)   Resp 18   Ht 5\' 4"  (1.626 m)   Wt 67.3 kg (148 lb 4.8 oz)   SpO2 94%   BMI 25.46 kg/m   Intake/Output Summary (Last 24 hours) at 06/13/16 0927 Last data filed at 06/13/16 0853  Gross per 24 hour  Intake           536.21 ml  Output             3800 ml  Net         -3263.79 ml   Intake/Output: I/O last 3 completed shifts: In: 496.2 [P.O.:200; I.V.:164.2; IV Piggyback:132] Out: 2409 [Urine:4350]  Intake/Output this shift:  Total I/O In: 240 [P.O.:240] Out: 1000 [Urine:1000] Weight change: -1.769 kg (-3 lb 14.4 oz) Gen:WD WN WF in NAd CVS:no rub Resp:decreased BS at bases L>R BDZ:HGDJME Ext:2+ edema- but only to knees    Recent Labs Lab 06/07/16 0423 06/08/16 0516 06/09/16 0551 06/10/16 0533 06/10/16 1131 06/11/16 0546 06/11/16 1129 06/12/16 0423 06/13/16 0413  NA 130* 131*  132* 129* 128*  --  131* 130* 132* 132*  K 3.0* 3.4*  3.4* 3.8 3.5  --  3.1* 3.5 3.4* 3.0*  CL 94* 93*  94* 90* 87*  --  87* 85* 86* 85*  CO2 29 28  26 31 28   --  33* 30 34* 36*  GLUCOSE 91 122*  120* 183* 315*  --  54* 222* 245* 202*  BUN 45* 51*  51* 60* 67*  --  70* 73* 81* 88*  CREATININE 2.72* 2.85*  2.81* 2.92* 2.96*  --  2.83* 2.89* 3.39* 3.46*  ALBUMIN 2.1* 2.3* 2.4* 2.3* 2.3* 2.6*  --  2.6* 2.4*  CALCIUM 7.6* 7.8*  7.9* 8.2* 8.3*  --  8.7* 8.4* 8.7* 8.4*  PHOS 3.6 3.5 3.6  --  4.2 4.8*  --  4.6 5.1*  AST  --   --   --  27  --   --   --   --   --   ALT  --   --   --  31  --   --   --   --   --    Liver Function Tests:  Recent Labs Lab 06/10/16 0533  06/11/16 0546 06/12/16 0423 06/13/16 0413  AST 27  --   --   --   --   ALT 31  --   --   --   --   ALKPHOS 307*  --   --   --   --    BILITOT 0.5  --   --   --   --   PROT 5.5*  --   --   --   --   ALBUMIN 2.3*  < > 2.6* 2.6* 2.4*  < > = values in this interval not displayed. No results for input(s): LIPASE, AMYLASE in the last 168 hours. No results for input(s): AMMONIA in the last 168 hours. CBC:  Recent Labs Lab 06/08/16 0516 06/09/16 0552 06/10/16 0533 06/11/16 0546  WBC 13.3* 14.5* 12.9* 11.1*  HGB 9.1* 9.1* 9.2* 9.2*  HCT 27.0* 27.8* 27.3* 27.4*  MCV 79.2 79.2  78.9 78.7  PLT 443* 444* 416* 422*   Cardiac Enzymes: No results for input(s): CKTOTAL, CKMB, CKMBINDEX, TROPONINI in the last 168 hours. CBG:  Recent Labs Lab 06/12/16 0617 06/12/16 1055 06/12/16 1624 06/12/16 2022 06/13/16 0636  GLUCAP 216* 149* 220* 297* 162*    Iron Studies:   Recent Labs  06/13/16 0413  IRON 26*  TIBC 357  FERRITIN 176   Studies/Results: No results found. Marland Kitchen alteplase  2 mg Intracatheter Once  . amiodarone  400 mg Oral BID  . atorvastatin  40 mg Oral q1800  . bisacodyl  10 mg Oral Daily   Or  . bisacodyl  10 mg Rectal Daily  . cephALEXin  500 mg Oral Q8H  . docusate sodium  200 mg Oral Daily  . furosemide  160 mg Intravenous Q12H  . guaiFENesin  600 mg Oral BID  . insulin aspart  0-5 Units Subcutaneous QHS  . insulin aspart  0-9 Units Subcutaneous TID WC  . insulin detemir  16 Units Subcutaneous QHS  . lactulose  20 g Oral Once  . metoCLOPramide (REGLAN) injection  5 mg Intravenous Q6H  . pantoprazole  40 mg Oral Daily  . warfarin  2.5 mg Oral ONCE-1800  . Warfarin - Physician Dosing Inpatient   Does not apply q1800    BMET    Component Value Date/Time   NA 132 (L) 06/13/2016 0413   K 3.0 (L) 06/13/2016 0413   CL 85 (L) 06/13/2016 0413   CO2 36 (H) 06/13/2016 0413   GLUCOSE 202 (H) 06/13/2016 0413   BUN 88 (H) 06/13/2016 0413   CREATININE 3.46 (H) 06/13/2016 0413   CALCIUM 8.4 (L) 06/13/2016 0413   GFRNONAA 13 (L) 06/13/2016 0413   GFRAA 15 (L) 06/13/2016 0413   CBC    Component  Value Date/Time   WBC 11.1 (H) 06/11/2016 0546   RBC 3.48 (L) 06/11/2016 0546   HGB 9.2 (L) 06/11/2016 0546   HCT 27.4 (L) 06/11/2016 0546   PLT 422 (H) 06/11/2016 0546   MCV 78.7 06/11/2016 0546   MCH 26.4 06/11/2016 0546   MCHC 33.6 06/11/2016 0546   RDW 16.6 (H) 06/11/2016 0546   LYMPHSABS 0.6 (L) 04/29/2016 1031   MONOABS 0.7 04/29/2016 1031   EOSABS 0.1 04/29/2016 1031   BASOSABS 0.1 04/29/2016 1031     Assessment/Plan:  1. AKI/CKD stage 3 in setting of ischemic ATN following CABG.  Follows with Dr. Isaias Cowman in [place] as OP. Baseline Scr 1.4-1.7 pre-op and peaked at 5.16. S/p IHD on 12/27 and 12/28. Scr hovering around 2.8-2.9 for last several days. Excellent UOP.  temp HD catheter removed 06/10/16. She continued to diurese very well.  I think the latest bump in creatinine was just too much diuresis in a short amount of time- 8.5 liters in 48 hours.  Her metolazone has been stopped and her lasix decreased slightly- hopefully we will see some settling out of renal function.  I am going to change her to PO lasix today 80 BID.  Patient only wants dialysis as A LAST RESORT and she is not open to it at this time.  Understands that watching her kidney function may keep her in the hospital longer. Need to see plateau or improvement in kidney function before can be discharged- she is also still on milrinone.  She says she is leaving tomorrow regardless  2. NSTEMI with CAD s/p CABG 05/27/16- 3 vessel doing well 3. Ischemic cardiomyopathy (EF 20%)- on milrinone and amiodarone  1. marked diuresis over the last 48 hours with addition of metolazone- now stopped.   4. Continue to follow and cont with relatively high dose Lasix- changed to PO today- slightly higher than home dose 5. Hyponatremia- due to chf/volume xs- better 6. ABLA- follow and transfuse prn. hgb now 9.2- will check iron stores in AM 7. DM- poorly controlled 8. HTN- stable 9. Hypokalemia- replete with 40 PO today    [facility]  [employer] [phone]

## 2016-06-13 NOTE — Progress Notes (Signed)
CARDIAC REHAB PHASE I   PRE:  Rate/Rhythm: 48 SR c/ PVCs  BP:  Sitting: 158/58        SaO2: 95 RA  MODE:  Ambulation: 550 ft   POST:  Rate/Rhythm: 68 SR  BP:  Sitting: 128/47         SaO2: 94 RA  Pt ready to walk, states she is leaving tomorrow no matter what. Pt ambulated 550 ft on RA, IV, hand held assist, mostly steady gait, tolerated well. Pt c/o fatigue, mild DOE, denies any other complaints, declined rest stop. Began cardiac surgery discharge education. Reviewed IS, sternal precautions, activity progression, exercise, daily weights, and phase 2 cardiac rehab. Left heart healthy, low sodium and diabetes diet handouts for pt to review. Left CHF booklet/zone tool and moving right along booklet for pt to review as well. Pt verbalized understanding. Phase 2 cardiac rehab referral sent to [facility]. Pt to bed per pt request after walk, call bell within reach. Will follow up tomorrow to complete education (review/reinforce diet, CHF).   [place], RN, BSN 06/13/2016 12:28 PM

## 2016-06-13 NOTE — Progress Notes (Signed)
Patient ID: Theresa Rogers, female   DOB: [birth year], 66 y.o.   MRN: [id number]    Advanced Heart Failure Rounding Note   Subjective:    Transferred from [facility] with volume overload despite IV diuresis + milrinone.   Now s/p CABG x 3 on 05/27/16  Milrinone and IV lasix restarted 12/23, required initiation of CVVH then HD.  Feeling OK this am. States she is going home tomorrow regardless of labs or MD recommendations, even if she has to sign out AMA. Legs remains swollen. Mild LE cellulitis.   Not wearing ted hose.   On milrinone 0.125 mcg/kg/min. Coox 64.5% this am.  Weight down 4 lbs out 2.5 L yesterday.     Creatinine 2.89 => 3.39 => 3.46  Objective:   Weight Range:  Vital Signs:   Temp:  [97.5 F (36.4 C)-98.7 F (37.1 C)] 98.7 F (37.1 C) (01/04 0426) Pulse Rate:  [62-67] 67 (01/04 0426) Resp:  [18] 18 (01/04 0426) BP: (140-142)/(59-66) 141/59 (01/04 0426) SpO2:  [94 %-97 %] 94 % (01/04 0426) Weight:  [148 lb 4.8 oz (67.3 kg)] 148 lb 4.8 oz (67.3 kg) (01/04 0426) Last BM Date: 06/11/16  Weight change: Filed Weights   06/11/16 0538 06/12/16 0449 06/13/16 0426  Weight: 153 lb (69.4 kg) 152 lb 3.2 oz (69 kg) 148 lb 4.8 oz (67.3 kg)    Intake/Output:   Intake/Output Summary (Last 24 hours) at 06/13/16 1127 Last data filed at 06/13/16 0853  Gross per 24 hour  Intake           536.21 ml  Output             3000 ml  Net         -2463.79 ml     Physical Exam: General:  Sitting on edge of bed. NAD.  HEENT: Normal Neck: supple. JVP appears ~10 cm. Carotids 2+ bilat; no bruits. No thyromegaly or nodule noted.  Cor: Regular S1S2, no murmur, no S3/S4. Scar healing well.  Lungs: Diminished basilar sounds, otherwise clear.  Abdomen: soft, NT, ND, no HSM. No bruits or masses. +BS  Extremities: no cyanosis, clubbing, rash, BLE 2-3+ edema 2/3 way to knees. RLE erythema Neuro: alert & orientedx3, cranial nerves grossly intact. moves all 4 extremities w/o difficulty. Affect  pleasant  Telemetry: Reviewed personally, NSR 60s     Labs: Basic Metabolic Panel:  Recent Labs Lab 06/09/16 0551 06/10/16 0533 06/10/16 1131 06/11/16 0546 06/11/16 1129 06/12/16 0423 06/13/16 0413  NA 129* 128*  --  131* 130* 132* 132*  K 3.8 3.5  --  3.1* 3.5 3.4* 3.0*  CL 90* 87*  --  87* 85* 86* 85*  CO2 31 28  --  33* 30 34* 36*  GLUCOSE 183* 315*  --  54* 222* 245* 202*  BUN 60* 67*  --  70* 73* 81* 88*  CREATININE 2.92* 2.96*  --  2.83* 2.89* 3.39* 3.46*  CALCIUM 8.2* 8.3*  --  8.7* 8.4* 8.7* 8.4*  PHOS 3.6  --  4.2 4.8*  --  4.6 5.1*    Liver Function Tests:  Recent Labs Lab 06/10/16 0533 06/10/16 1131 06/11/16 0546 06/12/16 0423 06/13/16 0413  AST 27  --   --   --   --   ALT 31  --   --   --   --   ALKPHOS 307*  --   --   --   --   BILITOT 0.5  --   --   --   --  PROT 5.5*  --   --   --   --   ALBUMIN 2.3* 2.3* 2.6* 2.6* 2.4*   No results for input(s): LIPASE, AMYLASE in the last 168 hours. No results for input(s): AMMONIA in the last 168 hours.  CBC:  Recent Labs Lab 06/08/16 0516 06/09/16 0552 06/10/16 0533 06/11/16 0546  WBC 13.3* 14.5* 12.9* 11.1*  HGB 9.1* 9.1* 9.2* 9.2*  HCT 27.0* 27.8* 27.3* 27.4*  MCV 79.2 79.2 78.9 78.7  PLT 443* 444* 416* 422*    Cardiac Enzymes: No results for input(s): CKTOTAL, CKMB, CKMBINDEX, TROPONINI in the last 168 hours.  BNP: BNP (last 3 results)  Recent Labs  06/19/15 0923 05/13/16 0905 06/04/16 0834  BNP 1,782.0* 2,790.0* 3,366.3*    ProBNP (last 3 results) No results for input(s): PROBNP in the last 8760 hours.    Other results:  Imaging: No results found.   Medications:     Scheduled Medications: . alteplase  2 mg Intracatheter Once  . amiodarone  400 mg Oral BID  . atorvastatin  40 mg Oral q1800  . bisacodyl  10 mg Oral Daily   Or  . bisacodyl  10 mg Rectal Daily  . cephALEXin  500 mg Oral Q8H  . docusate sodium  200 mg Oral Daily  . furosemide  80 mg Oral BID  .  guaiFENesin  600 mg Oral BID  . insulin aspart  0-5 Units Subcutaneous QHS  . insulin aspart  0-9 Units Subcutaneous TID WC  . insulin detemir  16 Units Subcutaneous QHS  . lactulose  20 g Oral Once  . metoCLOPramide (REGLAN) injection  5 mg Intravenous Q6H  . pantoprazole  40 mg Oral Daily  . warfarin  2.5 mg Oral ONCE-1800  . Warfarin - Physician Dosing Inpatient   Does not apply q1800    Infusions: . milrinone 0.125 mcg/kg/min (06/13/16 0534)    PRN Medications: guaiFENesin-dextromethorphan, lidocaine (PF), ondansetron (ZOFRAN) IV, oxyCODONE, promethazine, sodium chloride flush, traMADol   Assessment:   1. A/C Systolic Heart Failure: Ischemic cardiomyopathy, EF 20% with mild LV dilation on 12/17 echo.  2. NSTEMI with CAD-severe 2 vessel disease LAD RCA    - s/p CABG 12/18 with LIMA-LAD, SVG-RCA and SVG-diagonal 3. AKI on CKD: Required CVVH then HD.  4. Uncontrolled DM- Hgb AC 11.3  5. Hyperkalemia  6. Elevated TSH 7. COPD  8. PAF: on amiodarone.  9. Hypokalemia  10. Hyponatremia 11. LE cellulitis - on Keflex  Plan/Discussion:    She was on CVVH, now has had 2 sessions of HD but none since 12/28.  Temp HD cath removed 1/1.  Diuresed well over past 2 days. Creatinine trending up. Renal following closely.  Pt refusing dialysis except as "Last resort". Adamant that she would like to go home tomorrow.  Renal has adjusted diuretics.   Co-ox 64.5% on milrinone 0.125 mcg/kg/min. Will stop and follow coox in am.   No ACE/ARB with AKI. No b-blocker with low output. Continue statin.     Remains in Afib with controlled rate. Continue po amio. May need trial of renexa. Will check EKG as well.   Length of Stay: [address]  Theresa Rogers  06/13/2016, 11:27 AM  Advanced Heart Failure Team Pager [phone] (M-F; 7a - 4p)  Please contact [facility] for night-coverage after hours (4p -7a ) and weekends on [email or web address]  Patient seen and examined with Oda Kilts, PA-C. We  discussed all aspects of the encounter. I agree with the  assessment and plan as stated above.   Decent urine output on po lasix. She remains volume overloaded but is making progress with oral diuretics. Appreciate renal input. She is adamant that she is leaving tomorrow no matter what. Will wean milrinone to off. Co-ox ok this am. Maintaining NSR. Continue po amio and warfarin. On keflex for cellulitis. Watch INR closely.  Octavie Westerhold,MD 8:32 PM

## 2016-06-13 NOTE — Progress Notes (Addendum)
      [address]       [place] [address]             [phone]      17 Days Post-Op Procedure(s) (LRB): CORONARY ARTERY BYPASS GRAFTING (CABG) x3 using left internal mammary artery and left greater saphenous vein harvested endoscopically (N/A) TRANSESOPHAGEAL ECHOCARDIOGRAM (TEE) (N/A)   Subjective:  Exhausted, frustrated, wants to go home.  States she isn't doing dialysis full time  Objective: Vital signs in last 24 hours: Temp:  [97.5 F (36.4 C)-98.7 F (37.1 C)] 98.7 F (37.1 C) (01/04 0426) Pulse Rate:  [62-67] 67 (01/04 0426) Cardiac Rhythm: Normal sinus rhythm (01/04 0733) Resp:  [18] 18 (01/04 0426) BP: (140-142)/(59-66) 141/59 (01/04 0426) SpO2:  [94 %-97 %] 94 % (01/04 0426) Weight:  [148 lb 4.8 oz (67.3 kg)] 148 lb 4.8 oz (67.3 kg) (01/04 0426)  Intake/Output from previous day: 01/03 0701 - 01/04 0700 In: 296.2 [I.V.:164.2; IV Piggyback:132] Out: 2800 [Urine:2800]  General appearance: alert, cooperative and no distress Heart: regular rate and rhythm Lungs: clear to auscultation bilaterally Abdomen: soft, non-tender; bowel sounds normal; no masses,  no organomegaly Extremities: edema 3+, erythema present, cellulitis Wound: clean and dry  Lab Results:  Recent Labs  06/11/16 0546  WBC 11.1*  HGB 9.2*  HCT 27.4*  PLT 422*   BMET:  Recent Labs  06/12/16 0423 06/13/16 0413  NA 132* 132*  K 3.4* 3.0*  CL 86* 85*  CO2 34* 36*  GLUCOSE 245* 202*  BUN 81* 88*  CREATININE 3.39* 3.46*  CALCIUM 8.7* 8.4*    PT/INR:  Recent Labs  06/13/16 0413  LABPROT 25.4*  INR 2.27   ABG    Component Value Date/Time   PHART 7.425 05/28/2016 0512   HCO3 25.9 05/28/2016 0512   TCO2 28 05/29/2016 1703   ACIDBASEDEF 1.0 05/27/2016 1417   O2SAT 64.5 06/13/2016 0428   CBG (last 3)   Recent Labs  06/12/16 1624 06/12/16 2022 06/13/16 0636  GLUCAP 220* 297* 162*    Assessment/Plan: S/P Procedure(s) (LRB): CORONARY ARTERY BYPASS  GRAFTING (CABG) x3 using left internal mammary artery and left greater saphenous vein harvested endoscopically (N/A) TRANSESOPHAGEAL ECHOCARDIOGRAM (TEE) (N/A)  1. CV- A. Fib, remains on Milrinone drip, Amiodarone oral regimen-- management per HF 2. INR 2.27, will restart coumadin at 2.5 mg daily as will likely decrease again tomorrow as has been 3 days without a dose.  She ultimately may require 1 mg daily 3. Pulm- no acute issues, continue IS 4. Renal- 3.46, remains edematous on Lasix, Zaroxolyn.. Nephrology on board, there is possibility of needing dialysis full time.Marland Kitchen However patient is not agreeable to this 5. LE Cellulitis- continue Keflex 6. Dispo- continue care per HF, restart coumadin, continue ABX per for cellulitis   LOS: 24 days    BARRETT, ERIN 06/13/2016  Maintaining nsr Coumadin 2 mg daily with HHN to draw INR to coumadin clinic Keflex for 7 days at DC patient examined and medical record reviewed,agree with above note. [name] 06/13/2016

## 2016-06-14 LAB — URINE CULTURE: Culture: >=100000 — AB

## 2016-06-14 LAB — RENAL FUNCTION PANEL
Albumin: 2.6 g/dL — ABNORMAL LOW (ref 3.5–5.0)
Anion gap: 14 (ref 5–15)
BUN: 96 mg/dL — AB (ref 6–20)
CHLORIDE: 84 mmol/L — AB (ref 101–111)
CO2: 35 mmol/L — AB (ref 22–32)
CREATININE: 3.39 mg/dL — AB (ref 0.44–1.00)
Calcium: 8.6 mg/dL — ABNORMAL LOW (ref 8.9–10.3)
GFR calc Af Amer: 15 mL/min — ABNORMAL LOW (ref >60)
GFR calc non Af Amer: 13 mL/min — ABNORMAL LOW (ref >60)
GLUCOSE: 48 mg/dL — AB (ref 65–99)
Phosphorus: 4.8 mg/dL — ABNORMAL HIGH (ref 2.5–4.6)
Potassium: 3 mmol/L — ABNORMAL LOW (ref 3.5–5.1)
SODIUM: 133 mmol/L — AB (ref 135–145)

## 2016-06-14 LAB — COOXEMETRY PANEL
Carboxyhemoglobin: 1.5 % (ref 0.5–1.5)
Methemoglobin: 1.1 % (ref 0.0–1.5)
O2 Saturation: 68.9 %
Total hemoglobin: 8.7 g/dL — ABNORMAL LOW (ref 12.0–16.0)

## 2016-06-14 LAB — PROTIME-INR
INR: 2.18
PROTHROMBIN TIME: 24.6 s — AB (ref 11.4–15.2)

## 2016-06-14 LAB — GLUCOSE, CAPILLARY
GLUCOSE-CAPILLARY: 50 mg/dL — AB (ref 65–99)
GLUCOSE-CAPILLARY: 91 mg/dL (ref 65–99)
Glucose-Capillary: 212 mg/dL — ABNORMAL HIGH (ref 65–99)

## 2016-06-14 MED ORDER — AMIODARONE HCL 200 MG PO TABS: 200.0000 mg | ORAL_TABLET | Freq: Two times a day (BID) | ORAL | 1 refills | Status: DC

## 2016-06-14 MED ORDER — COUMADIN BOOK
Freq: Once | Status: DC
  Filled 2016-06-14: qty 1

## 2016-06-14 MED ORDER — CEPHALEXIN 500 MG PO CAPS: 500.0000 mg | ORAL_CAPSULE | Freq: Three times a day (TID) | ORAL | 0 refills | Status: AC

## 2016-06-14 MED ORDER — POTASSIUM CHLORIDE CRYS ER 20 MEQ PO TBCR
30.0000 meq | EXTENDED_RELEASE_TABLET | Freq: Once | ORAL | Status: AC
  Administered 2016-06-14: 30 meq via ORAL
  Filled 2016-06-14: qty 1

## 2016-06-14 MED ORDER — AMIODARONE HCL 200 MG PO TABS
200.0000 mg | ORAL_TABLET | Freq: Two times a day (BID) | ORAL | Status: DC
  Administered 2016-06-14: 200 mg via ORAL
  Filled 2016-06-14: qty 1

## 2016-06-14 MED ORDER — OXYCODONE HCL 5 MG PO TABS: 5.0000 mg | ORAL_TABLET | Freq: Four times a day (QID) | ORAL | 0 refills | Status: DC | PRN

## 2016-06-14 MED ORDER — COUMADIN BOOK: 1.0000 | Freq: Once | 0 refills | Status: AC

## 2016-06-14 MED ORDER — FUROSEMIDE 80 MG PO TABS: 80.0000 mg | ORAL_TABLET | Freq: Two times a day (BID) | ORAL | 0 refills | Status: DC

## 2016-06-14 MED ORDER — WARFARIN SODIUM 2 MG PO TABS: 2.0000 mg | ORAL_TABLET | Freq: Every day | ORAL | 1 refills | Status: DC

## 2016-06-14 NOTE — Care Management Note (Addendum)
Case Management Note Previous CM note initiated by Maryclare Labrador, RN 06/11/2016, 10:26 AM   Patient Details  Name: Theresa Rogers MRN: [id number] Date of Birth: 1950-12-12  Subjective/Objective:   Pt is s/p CABG                 Action/Plan:  Pt alert and oriented during assessment.  PTA pt independent from home with adult daughter.     Expected Discharge Date:    06/14/16              Expected Discharge Plan:  [place]  In-House Referral:     Discharge planning Services  CM Consult  Post Acute Care Choice:  Home Health Choice offered to:  Patient  DME Arranged:  N/A DME Agency:  NA  HH Arranged:  RN, Disease Management [place] Agency:  [facility]  Status of Service:  Completed, signed off  If discussed at [place] of Stay Meetings, dates discussed:    Discharge Disposition: home with home health   Additional Comments:  06/14/16- 1145- Marvetta Gibbons RN, CM- notified by Nicholes Rough- PA- that pt would need INR check on Monday 1/8- have notified Santiago Glad with [facility] of need for INR- per Santiago Glad they will be able to provide the service and f/u with Dr. B for any further INR needs.   06/14/16- 1000- Cayce Paschal RN, CM- pt for d/c home today- order placed for [facility]- spoke with pt at bedside- pt agreeable to [facility] services- list provided for [facility] agencies in [facility]- per pt she would like to use [facility] for services- pt states she has been walking independently no RW needed. Referral called to Santiago Glad with [facility] for [facility] needs.   Maryclare Labrador, RN 06/11/2016, 10:26 AM-Pt remains on milrinone, IV lasix and metolazone.  Pt will also be new HD pt.  CM will continue to follow for discharge needs   Dawayne Patricia, RN 06/14/2016, 10:25 AM [phone]

## 2016-06-14 NOTE — Progress Notes (Signed)
CRITICAL VALUE ALERT  Critical value received: cbg 50   Date of notification:  06/14/2016  Time of notification:  05:15  Critical value read back:yes  Nurse who received alert:  Dhwani Venkatesh  MD notified (1st page): Treated rechecked, cbg now 91  Time of first page:    MD notified (2nd page):  Time of second page:  Responding MD:   Time MD responded:

## 2016-06-14 NOTE — Progress Notes (Signed)
Patient ID: Theresa Rogers, female   DOB: Mar 04, 1951, 66 y.o.   MRN: [id number]    Advanced Heart Failure Rounding Note   Subjective:    Feels better this am. Says she is going home. Diuresed well. Renal function slightly improved.   Objective:   Weight Range:  Vital Signs:   Temp:  [97.6 F (36.4 C)-98.6 F (37 C)] 98.6 F (37 C) (01/05 0519) Pulse Rate:  [62-66] 62 (01/05 0519) Resp:  [18] 18 (01/05 0519) BP: (136-139)/(56-74) 137/56 (01/05 0519) SpO2:  [92 %-97 %] 92 % (01/05 0519) Weight:  [66.6 kg (146 lb 14.4 oz)] 66.6 kg (146 lb 14.4 oz) (01/05 0519) Last BM Date: 06/13/16  Weight change: Filed Weights   06/12/16 0449 06/13/16 0426 06/14/16 0519  Weight: 69 kg (152 lb 3.2 oz) 67.3 kg (148 lb 4.8 oz) 66.6 kg (146 lb 14.4 oz)    Intake/Output:   Intake/Output Summary (Last 24 hours) at 06/14/16 0754 Last data filed at 06/14/16 0510  Gross per 24 hour  Intake              260 ml  Output             2000 ml  Net            -1740 ml     Physical Exam: General:  Sitting on edge of bed. NAD.  HEENT: Normal Neck: supple. JVP appears 8-9 cm. Carotids 2+ bilat; no bruits. No thyromegaly or nodule noted.  Cor: Regular S1S2, no murmur, no S3/S4. Scar healing well.  Lungs: Diminished basilar sounds, otherwise clear.  Abdomen: soft, NT, ND, no HSM. No bruits or masses. +BS  Extremities: no cyanosis, clubbing, rash, BLE 1-2+ edema. bilate LE erythema Neuro: alert & orientedx3, cranial nerves grossly intact. moves all 4 extremities w/o difficulty. Affect pleasant  Telemetry: Reviewed personally, NSR 60s     Labs: Basic Metabolic Panel:  Recent Labs Lab 06/10/16 1131 06/11/16 0546 06/11/16 1129 06/12/16 0423 06/13/16 0413 06/13/16 2232 06/14/16 0457  NA  --  131* 130* 132* 132*  --  133*  K  --  3.1* 3.5 3.4* 3.0*  --  3.0*  CL  --  87* 85* 86* 85*  --  84*  CO2  --  33* 30 34* 36*  --  35*  GLUCOSE  --  54* 222* 245* 202* 381* 48*  BUN  --  70* 73* 81* 88*   --  96*  CREATININE  --  2.83* 2.89* 3.39* 3.46*  --  3.39*  CALCIUM  --  8.7* 8.4* 8.7* 8.4*  --  8.6*  PHOS 4.2 4.8*  --  4.6 5.1*  --  4.8*    Liver Function Tests:  Recent Labs Lab 06/10/16 0533 06/10/16 1131 06/11/16 0546 06/12/16 0423 06/13/16 0413 06/14/16 0457  AST 27  --   --   --   --   --   ALT 31  --   --   --   --   --   ALKPHOS 307*  --   --   --   --   --   BILITOT 0.5  --   --   --   --   --   PROT 5.5*  --   --   --   --   --   ALBUMIN 2.3* 2.3* 2.6* 2.6* 2.4* 2.6*   No results for input(s): LIPASE, AMYLASE in the last 168 hours. No results for input(s):  AMMONIA in the last 168 hours.  CBC:  Recent Labs Lab 06/08/16 0516 06/09/16 0552 06/10/16 0533 06/11/16 0546  WBC 13.3* 14.5* 12.9* 11.1*  HGB 9.1* 9.1* 9.2* 9.2*  HCT 27.0* 27.8* 27.3* 27.4*  MCV 79.2 79.2 78.9 78.7  PLT 443* 444* 416* 422*    Cardiac Enzymes: No results for input(s): CKTOTAL, CKMB, CKMBINDEX, TROPONINI in the last 168 hours.  BNP: BNP (last 3 results)  Recent Labs  06/19/15 0923 05/13/16 0905 06/04/16 0834  BNP 1,782.0* 2,790.0* 3,366.3*    ProBNP (last 3 results) No results for input(s): PROBNP in the last 8760 hours.    Other results:  Imaging: No results found.   Medications:     Scheduled Medications: . alteplase  2 mg Intracatheter Once  . amiodarone  200 mg Oral BID  . atorvastatin  40 mg Oral q1800  . bisacodyl  10 mg Oral Daily   Or  . bisacodyl  10 mg Rectal Daily  . cephALEXin  500 mg Oral Q8H  . docusate sodium  200 mg Oral Daily  . furosemide  80 mg Oral BID  . insulin aspart  0-5 Units Subcutaneous QHS  . insulin aspart  0-9 Units Subcutaneous TID WC  . insulin detemir  16 Units Subcutaneous QHS  . lactulose  20 g Oral Once  . pantoprazole  40 mg Oral Daily  . potassium chloride  30 mEq Oral Once  . Warfarin - Physician Dosing Inpatient   Does not apply q1800    Infusions:   PRN Medications: guaiFENesin-dextromethorphan,  lidocaine (PF), ondansetron (ZOFRAN) IV, oxyCODONE, promethazine, sodium chloride flush, traMADol   Assessment:   1. A/C Systolic Heart Failure: Ischemic cardiomyopathy, EF 20% with mild LV dilation on 12/17 echo.  2. NSTEMI with CAD-severe 2 vessel disease LAD RCA    - s/p CABG 12/18 with LIMA-LAD, SVG-RCA and SVG-diagonal 3. AKI on CKD: Required CVVH then HD.  4. Uncontrolled DM- Hgb AC 11.3  5. Hyperkalemia  6. Elevated TSH 7. COPD  8. PAF: on amiodarone.  9. Hypokalemia  10. Hyponatremia 11. LE cellulitis - on Keflex  Plan/Discussion:     Decent urine output on po lasix. She remains volume overloaded but is making good progress with oral diuretics. She is adamant that she is leaving today and I think this is fine.   Co-ox ok off milrinone. In NSR. Continue po amio and warfarin. On keflex for cellulitis. Watch INR closely.  I discussed lasix dosing with Dr. Moshe Cipro. Will use 80 bid for 1 week and readjust as needed in clinic. Supp K prior to d/c  D/c meds:  Lasix 80 po bid Amio 200 bid  Warfarin goal INR 2-3 Keflex Atorva 40 daily Kcl 20 bid Insulin per primary team  No b-blocker or ACE due to low output and renal failure  Follow-up  1) HF clinic next week 2. Coumadin clinic - needs INR on Monday   Khamil Lamica,MD 7:54 AM Advanced Heart Failure Team Pager [phone] (M-F; 7a - 4p)  Please contact [facility] for night-coverage after hours (4p -7a ) and weekends on [email or web address]

## 2016-06-14 NOTE — Progress Notes (Signed)
Patient ID: Theresa Rogers, female   DOB: 1951/01/12, 66 y.o.   MRN: [id number] S:Feels better- says swelling is better- put out 2 liters of urine- weight down- BUN up but creatinine down.  Off milrinone  O:BP (!) 137/56 (BP Location: Left Arm)   Pulse 62   Temp 98.6 F (37 C) (Oral)   Resp 18   Ht 5\' 4"  (1.626 m)   Wt 66.6 kg (146 lb 14.4 oz)   SpO2 92%   BMI 25.22 kg/m   Intake/Output Summary (Last 24 hours) at 06/14/16 0743 Last data filed at 06/14/16 0510  Gross per 24 hour  Intake              260 ml  Output             2000 ml  Net            -1740 ml   Intake/Output: I/O last 3 completed shifts: In: 270 [P.O.:240; I.V.:30] Out: 3600 [Urine:3600]  Intake/Output this shift:  No intake/output data recorded. Weight change: -0.635 kg (-1 lb 6.4 oz) Gen:WD WN WF in NAd CVS:no rub Resp:decreased BS at bases L>R WUJ:WJXBJY Ext:2+ edema- but only to knees    Recent Labs Lab 06/08/16 0516 06/09/16 0551 06/10/16 0533 06/10/16 1131 06/11/16 0546 06/11/16 1129 06/12/16 0423 06/13/16 0413 06/13/16 2232 06/14/16 0457  NA 131*  132* 129* 128*  --  131* 130* 132* 132*  --  133*  K 3.4*  3.4* 3.8 3.5  --  3.1* 3.5 3.4* 3.0*  --  3.0*  CL 93*  94* 90* 87*  --  87* 85* 86* 85*  --  84*  CO2 28  26 31 28   --  33* 30 34* 36*  --  35*  GLUCOSE 122*  120* 183* 315*  --  54* 222* 245* 202* 381* 48*  BUN 51*  51* 60* 67*  --  70* 73* 81* 88*  --  96*  CREATININE 2.85*  2.81* 2.92* 2.96*  --  2.83* 2.89* 3.39* 3.46*  --  3.39*  ALBUMIN 2.3* 2.4* 2.3* 2.3* 2.6*  --  2.6* 2.4*  --  2.6*  CALCIUM 7.8*  7.9* 8.2* 8.3*  --  8.7* 8.4* 8.7* 8.4*  --  8.6*  PHOS 3.5 3.6  --  4.2 4.8*  --  4.6 5.1*  --  4.8*  AST  --   --  27  --   --   --   --   --   --   --   ALT  --   --  31  --   --   --   --   --   --   --    Liver Function Tests:  Recent Labs Lab 06/10/16 0533  06/12/16 0423 06/13/16 0413 06/14/16 0457  AST 27  --   --   --   --   ALT 31  --   --   --   --    ALKPHOS 307*  --   --   --   --   BILITOT 0.5  --   --   --   --   PROT 5.5*  --   --   --   --   ALBUMIN 2.3*  < > 2.6* 2.4* 2.6*  < > = values in this interval not displayed. No results for input(s): LIPASE, AMYLASE in the last 168 hours. No results for input(s): AMMONIA in the last  168 hours. CBC:  Recent Labs Lab 06/08/16 0516 06/09/16 0552 06/10/16 0533 06/11/16 0546  WBC 13.3* 14.5* 12.9* 11.1*  HGB 9.1* 9.1* 9.2* 9.2*  HCT 27.0* 27.8* 27.3* 27.4*  MCV 79.2 79.2 78.9 78.7  PLT 443* 444* 416* 422*   Cardiac Enzymes: No results for input(s): CKTOTAL, CKMB, CKMBINDEX, TROPONINI in the last 168 hours. CBG:  Recent Labs Lab 06/13/16 1632 06/13/16 2116 06/13/16 2249 06/14/16 0513 06/14/16 0544  GLUCAP 345* 432* 349* 50* 91    Iron Studies:   Recent Labs  06/13/16 0413  IRON 26*  TIBC 357  FERRITIN 176   Studies/Results: No results found. Marland Kitchen alteplase  2 mg Intracatheter Once  . amiodarone  200 mg Oral BID  . atorvastatin  40 mg Oral q1800  . bisacodyl  10 mg Oral Daily   Or  . bisacodyl  10 mg Rectal Daily  . cephALEXin  500 mg Oral Q8H  . docusate sodium  200 mg Oral Daily  . furosemide  80 mg Oral BID  . insulin aspart  0-5 Units Subcutaneous QHS  . insulin aspart  0-9 Units Subcutaneous TID WC  . insulin detemir  16 Units Subcutaneous QHS  . lactulose  20 g Oral Once  . pantoprazole  40 mg Oral Daily  . potassium chloride  30 mEq Oral Once  . Warfarin - Physician Dosing Inpatient   Does not apply q1800    BMET    Component Value Date/Time   NA 133 (L) 06/14/2016 0457   K 3.0 (L) 06/14/2016 0457   CL 84 (L) 06/14/2016 0457   CO2 35 (H) 06/14/2016 0457   GLUCOSE 48 (L) 06/14/2016 0457   BUN 96 (H) 06/14/2016 0457   CREATININE 3.39 (H) 06/14/2016 0457   CALCIUM 8.6 (L) 06/14/2016 0457   GFRNONAA 13 (L) 06/14/2016 0457   GFRAA 15 (L) 06/14/2016 0457   CBC    Component Value Date/Time   WBC 11.1 (H) 06/11/2016 0546   RBC 3.48 (L)  06/11/2016 0546   HGB 9.2 (L) 06/11/2016 0546   HCT 27.4 (L) 06/11/2016 0546   PLT 422 (H) 06/11/2016 0546   MCV 78.7 06/11/2016 0546   MCH 26.4 06/11/2016 0546   MCHC 33.6 06/11/2016 0546   RDW 16.6 (H) 06/11/2016 0546   LYMPHSABS 0.6 (L) 04/29/2016 1031   MONOABS 0.7 04/29/2016 1031   EOSABS 0.1 04/29/2016 1031   BASOSABS 0.1 04/29/2016 1031     Assessment/Plan:  1. AKI/CKD stage 3 at baseline and AKI in setting of ischemic ATN following CABG.  [place]. [place]bigail Butts in [place]. Baseline Scr 1.4-1.7 pre-op and peaked at 5.16. S/P IHD on 12/27 and 12/28. Scr hovering around 2.8-2.9 with excellent UOP.  temp HD catheter removed 06/10/16. She continued to diurese well.  I think the latest bump in creatinine to the low 3's was just too much diuresis in a short amount of time- 8.5 liters in 48 hours.  Her metolazone has been stopped and her lasix decreased - I think we are seeing some settling out of renal function.  On PO lasix  80 BID with 2 liters UOP.  Patient only wants dialysis as A LAST RESORT and she is not open to further discussion at this time. The fact that her creatinine went down is good.  She says she is leaving.  I will attempt to arrange fairly close follow up with Dr. Abigail Butts.  Will keep on 80 BID for a week then  Dr. Jeffie Pollock to adjust  2. NSTEMI with CAD s/p CABG 05/27/16- 3 vessel doing well 3. Ischemic cardiomyopathy (EF 20%)- on milrinone and amiodarone- now off milrinone   4. Continue to follow and cont with relatively high dose Lasix- changed to PO - slightly higher than home dose 5. Hyponatremia- due to chf/volume xs- better 6. ABLA- follow and transfuse prn. hgb now 9.2- will check iron stores in AM 7. DM- poorly controlled 8. HTN- stable 9. Hypokalemia- repleting with 30 PO today - consider sending her out on 20 meq daily and  arrange follow up labs with Dr. [facility] [phone]

## 2016-06-14 NOTE — Progress Notes (Signed)
Inpatient Diabetes Program Recommendations  AACE/ADA: New Consensus Statement on Inpatient Glycemic Control (2015)  Target Ranges:  Prepandial:   less than 140 mg/dL      Peak postprandial:   less than 180 mg/dL (1-2 hours)      Critically ill patients:  140 - 180 mg/dL   Lab Results  Component Value Date   GLUCAP 91 06/14/2016   HGBA1C 11.2 (H) 05/22/2016   Results for ROSHAWNDA, PECORA (MRN [id number]) as of 06/14/2016 09:36  Ref. Range 06/13/2016 06:36 06/13/2016 10:46 06/13/2016 16:32 06/13/2016 21:16 06/13/2016 22:49 06/14/2016 05:13 06/14/2016 05:44  Glucose-Capillary Latest Ref Range: 65 - 99 mg/dL 162 (H) 140 (H) 345 (H)  Novolog 7 units 432 (H)  Novolog 10 units Detemir 16 units 349 (H)  Novolog 4 units 50 (L) 91   Review of Glycemic Control Diabetes related history: DM, CKD Outpatient Diabetes medications:     Lantus 15 units daily, Humalog 5-12 units tid with meals Current orders for Inpatient glycemic control:     Levemir 16 units QHS, Novolog sensitive tid with meals and QHS  Inpatient Diabetes Program Recommendations:    Please consider changing dosing and schedule of Levemir to 8 units BID.  Thank you,  Windy Carina, RN, MSN Diabetes Coordinator Inpatient Diabetes Program [phone] (Team Pager)

## 2016-06-14 NOTE — Progress Notes (Signed)
Patient in a stable condition, discharge education completed with patient she verbalised understanding, paper prescriptions givne to patient, patient belongings at bedside, piic removed by IV tema, tele dc ccmd notified, patient waiting for family member to come pick her

## 2016-06-14 NOTE — Progress Notes (Addendum)
      [address]       [place] [address]             [phone]        18 Days Post-Op Procedure(s) (LRB): CORONARY ARTERY BYPASS GRAFTING (CABG) x3 using left internal mammary artery and left greater saphenous vein harvested endoscopically (N/A) TRANSESOPHAGEAL ECHOCARDIOGRAM (TEE) (N/A)  Subjective: Patient had low blood sugar this am and feels bad. She is still adamant about going home today.  Objective: Vital signs in last 24 hours: Temp:  [97.6 F (36.4 C)-98.6 F (37 C)] 98.6 F (37 C) (01/05 0519) Pulse Rate:  [62-66] 62 (01/05 0519) Cardiac Rhythm: Normal sinus rhythm (01/04 1900) Resp:  [18] 18 (01/05 0519) BP: (136-139)/(56-74) 137/56 (01/05 0519) SpO2:  [92 %-97 %] 92 % (01/05 0519) Weight:  [146 lb 14.4 oz (66.6 kg)] 146 lb 14.4 oz (66.6 kg) (01/05 0519)  Pre op weight 65 kg Current Weight  06/14/16 146 lb 14.4 oz (66.6 kg)      Intake/Output from previous day: 01/04 0701 - 01/05 0700 In: 260 [P.O.:240; I.V.:20] Out: 2000 [Urine:2000]   Physical Exam:  Cardiovascular: RRR Pulmonary: Diminished at bases Abdomen: Soft, non tender, bowel sounds present. Extremities: ++ bilateral lower extremity edema. Some erythema LE  Wounds: Clean and dry.  No erythema or signs of infection.  Lab Results: CBC: No results for input(s): WBC, HGB, HCT, PLT in the last 72 hours. BMET:   Recent Labs  06/13/16 0413 06/13/16 2232 06/14/16 0457  NA 132*  --  133*  K 3.0*  --  3.0*  CL 85*  --  84*  CO2 36*  --  35*  GLUCOSE 202* 381* 48*  BUN 88*  --  96*  CREATININE 3.46*  --  3.39*  CALCIUM 8.4*  --  8.6*    PT/INR:  Lab Results  Component Value Date   INR 2.18 06/14/2016   INR 2.27 06/13/2016   INR 3.03 06/12/2016   ABG:  INR: Will add last result for INR, ABG once components are confirmed Will add last 4 CBG results once components are confirmed  Assessment/Plan:  1. CV - In SR in the hight 50's to low 60's.  Co ox 68.9  this am. On Amiodarone 400 mg bid but will decrease to 200 mg bid as HR low. NR down to 2.18. Given 2 mg of Coumadin last night so will see result in am.Heart failure following. 2.  Pulmonary - On room air. Encourage incentive spirometer. 3.  Acute blood loss anemia - Last H and H stable at 9.2 and 27.4 Had transfusion. 4. DM-CBGs 349/50/91. On Insulin. On Pre op HGA1C 11.3. Will need close medical follow up after discharge. 5.AKI/CKD stage 3 in setting of ischemic ATN-creatinine 3.39. She only wants HD as a last resort. 6. Volume Overload-on Lasix 80 mg po bid 7.ID- on Keflex for cellulitis of lower extremities. She has a fair amount of edema. 8. Gently supplement potassium  9. Per patient, she is adamant about leaving today. Will discuss with Dr. Prescott Gum  Governor Matos MPA-C 06/14/2016,7:28 AM

## 2016-06-14 NOTE — Progress Notes (Signed)
Patient does not want to drive to [facility] coumadin clinic and [facility] clinic does not offer Monday appointments. I have contacted case management to reach out to home health and see if they are able to draw an INR on Monday 06/17/2016 and then call Dr. Haroldine Laws for dosing. She has an appointment with the heart failure clinic next week and they can coordinate care after her initial  INR draw. She prefers [facility] cou[facility] clinic since it is much closer to where she lives.    Nicholes Rough, PA-C

## 2016-06-14 NOTE — Discharge Instructions (Signed)
Coronary Artery Bypass Grafting, Care After These instructions give you information on caring for yourself after your procedure. Your doctor may also give you more specific instructions. Call your doctor if you have any problems or questions after your procedure. Follow these instructions at home:  Only take medicine as told by your doctor. Take medicines exactly as told. Do not stop taking medicines or start any new medicines without talking to your doctor first.  Take your pulse as told by your doctor.  Do deep breathing as told by your doctor. Use your breathing device (incentive spirometer), if given, to practice deep breathing several times a day. Support your chest with a pillow or your arms when you take deep breaths or cough.  Keep the area clean, dry, and protected where the surgery cuts (incisions) were made. Remove bandages (dressings) only as told by your doctor. If strips were applied to surgical area, do not take them off. They fall off on their own.  Check the surgery area daily for puffiness (swelling), redness, or leaking fluid.  If surgery cuts were made in your legs:  Avoid crossing your legs.  Avoid sitting for long periods of time. Change positions every 30 minutes.  Raise your legs when you are sitting. Place them on pillows.  Wear stockings that help keep blood clots from forming in your legs (compression stockings).  Only take sponge baths until your doctor says it is okay to take showers. Pat the surgery area dry. Do not rub the surgery area with a washcloth or towel. Do not bathe, swim, or use a hot tub until your doctor says it is okay.  Eat foods that are high in fiber. These include raw fruits and vegetables, whole grains, beans, and nuts. Choose lean meats. Avoid canned, processed, and fried foods.  Drink enough fluids to keep your pee (urine) clear or pale yellow.  Weigh yourself every day.  Rest and limit activity as told by your doctor. You may be told  to:  Stop any activity if you have chest pain, shortness of breath, changes in heartbeat, or dizziness. Get help right away if this happens.  Move around often for short amounts of time or take short walks as told by your doctor. Gradually become more active. You may need help to strengthen your muscles and build endurance.  Avoid lifting, pushing, or pulling anything heavier than 10 pounds (4.5 kg) for at least 6 weeks after surgery.  Do not drive until your doctor says it is okay.  Ask your doctor when you can go back to work.  Ask your doctor when you can begin sexual activity again.  Follow up with your doctor as told. Contact a doctor if:  You have puffiness, redness, more pain, or fluid draining from the incision site.  You have a fever.  You have puffiness in your ankles or legs.  You have pain in your legs.  You gain 2 or more pounds (0.9 kg) a day.  You feel sick to your stomach (nauseous) or throw up (vomit).  You have watery poop (diarrhea). Get help right away if:  You have chest pain that goes to your jaw or arms.  You have shortness of breath.  You have a fast or irregular heartbeat.  You notice a "clicking" in your breastbone when you move.  You have numbness or weakness in your arms or legs.  You feel dizzy or light-headed. This information is not intended to replace advice given to you by  your health care provider. Make sure you discuss any questions you have with your health care provider. Document Released: 06/01/2013 Document Revised: 11/02/2015 Document Reviewed: 11/03/2012 Elsevier Interactive Patient Education  2017 [place] on my medicine - Coumadin   (Warfarin)  This medication education was reviewed with me or my healthcare representative as part of my discharge preparation.  The pharmacist that spoke with me during my hospital stay was:  Deboraha Sprang, [facility]  Why was Coumadin prescribed for you? Coumadin was  prescribed for you because you have a blood clot or a medical condition that can cause an increased risk of forming blood clots. Blood clots can cause serious health problems by blocking the flow of blood to the heart, lung, or brain. Coumadin can prevent harmful blood clots from forming. As a reminder your indication for Coumadin is:   Stroke Prevention Because Of Atrial Fibrillation  What test will check on my response to Coumadin? While on Coumadin (warfarin) you will need to have an INR test regularly to ensure that your dose is keeping you in the desired range. The INR (international normalized ratio) number is calculated from the result of the laboratory test called prothrombin time (PT).  If an INR APPOINTMENT HAS NOT ALREADY BEEN MADE FOR YOU please schedule an appointment to have this lab work done by your health care provider within 7 days. Your INR goal is usually a number between:  2 to 3 or your provider may give you a more narrow range like 2-2.5.  Ask your health care provider during an office visit what your goal INR is.  What  do you need to  know  About  COUMADIN? Take Coumadin (warfarin) exactly as prescribed by your healthcare provider about the same time each day.  DO NOT stop taking without talking to the doctor who prescribed the medication.  Stopping without other blood clot prevention medication to take the place of Coumadin may increase your risk of developing a new clot or stroke.  Get refills before you run out.  What do you do if you miss a dose? If you miss a dose, take it as soon as you remember on the same day then continue your regularly scheduled regimen the next day.  Do not take two doses of Coumadin at the same time.  Important Safety Information A possible side effect of Coumadin (Warfarin) is an increased risk of bleeding. You should call your healthcare provider right away if you experience any of the following: ? Bleeding from an injury or your nose that does  not stop. ? Unusual colored urine (red or dark brown) or unusual colored stools (red or black). ? Unusual bruising for unknown reasons. ? A serious fall or if you hit your head (even if there is no bleeding).  Some foods or medicines interact with Coumadin (warfarin) and might alter your response to warfarin. To help avoid this: ? Eat a balanced diet, maintaining a consistent amount of Vitamin K. ? Notify your provider about major diet changes you plan to make. ? Avoid alcohol or limit your intake to 1 drink for women and 2 drinks for men per day. (1 drink is 5 oz. wine, 12 oz. beer, or 1.5 oz. liquor.)  Make sure that ANY health care provider who prescribes medication for you knows that you are taking Coumadin (warfarin).  Also make sure the healthcare provider who is monitoring your Coumadin knows when you have started a new medication including herbals  and non-prescription products.  Coumadin (Warfarin)  Major Drug Interactions  Increased Warfarin Effect Decreased Warfarin Effect  Alcohol (large quantities) Antibiotics (esp. Septra/Bactrim, Flagyl, Cipro) Amiodarone (Cordarone) Aspirin (ASA) Cimetidine (Tagamet) Megestrol (Megace) NSAIDs (ibuprofen, naproxen, etc.) Piroxicam (Feldene) Propafenone (Rythmol SR) Propranolol (Inderal) Isoniazid (INH) Posaconazole (Noxafil) Barbiturates (Phenobarbital) Carbamazepine (Tegretol) Chlordiazepoxide (Librium) Cholestyramine (Questran) Griseofulvin Oral Contraceptives Rifampin Sucralfate (Carafate) Vitamin K   Coumadin (Warfarin) Major Herbal Interactions  Increased Warfarin Effect Decreased Warfarin Effect  Garlic Ginseng Ginkgo biloba Coenzyme Q10 Green tea St. Johns wort    Coumadin (Warfarin) FOOD Interactions  Eat a consistent number of servings per week of foods HIGH in Vitamin K (1 serving =  cup)  Collards (cooked, or boiled & drained) Kale (cooked, or boiled & drained) Mustard greens (cooked, or boiled &  drained) Parsley *serving size only =  cup Spinach (cooked, or boiled & drained) Swiss chard (cooked, or boiled & drained) Turnip greens (cooked, or boiled & drained)  Eat a consistent number of servings per week of foods MEDIUM-HIGH in Vitamin K (1 serving = 1 cup)  Asparagus (cooked, or boiled & drained) Broccoli (cooked, boiled & drained, or raw & chopped) Brussel sprouts (cooked, or boiled & drained) *serving size only =  cup Lettuce, raw (green leaf, endive, romaine) Spinach, raw Turnip greens, raw & chopped   These websites have more information on Coumadin (warfarin):  FailFactory.se; VeganReport.com.au;

## 2016-06-14 NOTE — Progress Notes (Signed)
[id number] Pt has walked once already this morning. Stated she is saving energy for going home. Reviewed CHF booklet with pt and discussed when to call MD with weight gain or symptoms. Gave low sodium handouts and discussed 2000 mg restriction. Discussed 2L FR.  Pt voiced understanding of ed. Graylon Good RN BSN 06/14/2016 11:22 AM

## 2016-06-17 ENCOUNTER — Telehealth: Payer: Self-pay | Admitting: Surgical

## 2016-06-17 NOTE — Telephone Encounter (Signed)
      [address]       [place] [address]             [phone]       Attempted to contact patient to see how she was doing. No answer at this time.  Theresa Rogers E

## 2016-06-19 ENCOUNTER — Telehealth: Payer: Self-pay | Admitting: *Deleted

## 2016-06-19 ENCOUNTER — Telehealth (HOSPITAL_COMMUNITY): Payer: Self-pay | Admitting: *Deleted

## 2016-06-19 ENCOUNTER — Ambulatory Visit (INDEPENDENT_AMBULATORY_CARE_PROVIDER_SITE_OTHER): Payer: Medicaid Other | Admitting: Cardiovascular Disease

## 2016-06-19 ENCOUNTER — Encounter (HOSPITAL_COMMUNITY): Payer: Medicaid Other

## 2016-06-19 DIAGNOSIS — I4891 Unspecified atrial fibrillation: Secondary | ICD-10-CM | POA: Insufficient documentation

## 2016-06-19 LAB — POCT INR: INR: 4.7

## 2016-06-19 NOTE — Telephone Encounter (Signed)
-----   Message from Scarlette Calico, RN sent at 06/19/2016  9:06 AM EST ----- Hey this pt just had cabg and was d/c'd on coumadin, HHRN will call you guys today w/INR results and she will need to be established with you guys please, she lives in [place] so will prob go to [place] office for coumadin checks once [facility] d/c her, thanks

## 2016-06-19 NOTE — Telephone Encounter (Signed)
Sharee Pimple, RN from [facility] called saying they are going to patient's home to set up home health.  They had an order to draw PT/INR labs from Dr. Haroldine Laws on patient this past Monday and Sharee Pimple was asking for a verbal order to draw today instead since today is their first visit with patient.    This is a new patient to our clinic and patient was suppose to be set up for INR checks with the coumadin clinic on her recent discharge from hospital.  Patient was not set up so our office has contacted Coumadin clinic to set patient up today.   I returned [place] call and gave her verbal order to draw labs today but to send results to Coumadin clinic at [phone].  No further questions at this time.

## 2016-06-19 NOTE — Telephone Encounter (Signed)
See coumadin encounter of this date January 10th

## 2016-06-21 ENCOUNTER — Other Ambulatory Visit (HOSPITAL_COMMUNITY): Payer: Self-pay | Admitting: *Deleted

## 2016-06-21 MED ORDER — FUROSEMIDE 80 MG PO TABS: 80.0000 mg | ORAL_TABLET | Freq: Two times a day (BID) | ORAL | 0 refills | Status: DC

## 2016-06-24 ENCOUNTER — Ambulatory Visit (INDEPENDENT_AMBULATORY_CARE_PROVIDER_SITE_OTHER): Payer: Medicaid Other

## 2016-06-24 DIAGNOSIS — I4891 Unspecified atrial fibrillation: Secondary | ICD-10-CM

## 2016-06-24 LAB — POCT INR: INR: 4.3

## 2016-06-24 MED ORDER — WARFARIN SODIUM 1 MG PO TABS: ORAL_TABLET | ORAL | 1 refills | Status: DC

## 2016-06-25 ENCOUNTER — Inpatient Hospital Stay (HOSPITAL_COMMUNITY): Admission: RE | Admit: 2016-06-25 | Payer: Medicaid Other | Source: Ambulatory Visit

## 2016-06-27 LAB — POCT INR: INR: 6

## 2016-06-28 ENCOUNTER — Ambulatory Visit (INDEPENDENT_AMBULATORY_CARE_PROVIDER_SITE_OTHER): Payer: Medicaid Other | Admitting: Pharmacist

## 2016-06-28 DIAGNOSIS — I4891 Unspecified atrial fibrillation: Secondary | ICD-10-CM

## 2016-07-01 ENCOUNTER — Other Ambulatory Visit (HOSPITAL_COMMUNITY): Payer: Self-pay

## 2016-07-01 ENCOUNTER — Telehealth (HOSPITAL_COMMUNITY): Payer: Self-pay

## 2016-07-01 ENCOUNTER — Ambulatory Visit (INDEPENDENT_AMBULATORY_CARE_PROVIDER_SITE_OTHER): Payer: Medicaid Other

## 2016-07-01 DIAGNOSIS — I4891 Unspecified atrial fibrillation: Secondary | ICD-10-CM

## 2016-07-01 LAB — POCT INR: INR: 2.5

## 2016-07-01 MED ORDER — POTASSIUM CHLORIDE CRYS ER 20 MEQ PO TBCR: 20.0000 meq | EXTENDED_RELEASE_TABLET | Freq: Two times a day (BID) | ORAL | 3 refills | Status: DC

## 2016-07-01 NOTE — Telephone Encounter (Signed)
HHRN called to report 4 lb weight gain over past week with scant BLEE. Reports taking lasix 80 mg twice daily as ordered, however, reports she does not take any potassium nor does not have any in the home.  Patient is ordered potassium 20 meq twice daily.  Last serum K checked 2 weeks ago 3.0, Cr 3.4. Per Dr. Aundra Dubin, advised to take extra 40 mg (1/2 tablet) of lasix with normal afternoon dose, and to start potassium tonight as well (sent to preferred pharmacy electronically). Patient has f/u in 2 days with Oda Kilts PA-C in CHF clinic. Will follow up further at that time. Advised to call CHF clinic for worsening s/s. Aware and agreeable to plan as stated above.  Renee Pain, RN

## 2016-07-03 ENCOUNTER — Ambulatory Visit (HOSPITAL_COMMUNITY)
Admission: RE | Admit: 2016-07-03 | Discharge: 2016-07-03 | Disposition: A | Discharge status: Home / Self Care | Payer: Medicare Other | Source: Ambulatory Visit | Attending: Internal Medicine | Admitting: Internal Medicine

## 2016-07-03 ENCOUNTER — Encounter (HOSPITAL_COMMUNITY): Payer: Self-pay

## 2016-07-03 VITALS — BP 152/70 | HR 57 | Wt 159.6 lb

## 2016-07-03 DIAGNOSIS — I10 Essential (primary) hypertension: Secondary | ICD-10-CM | POA: Diagnosis not present

## 2016-07-03 DIAGNOSIS — I5022 Chronic systolic (congestive) heart failure: Secondary | ICD-10-CM

## 2016-07-03 DIAGNOSIS — N183 Chronic kidney disease, stage 3 (moderate): Secondary | ICD-10-CM | POA: Diagnosis not present

## 2016-07-03 DIAGNOSIS — R001 Bradycardia, unspecified: Secondary | ICD-10-CM | POA: Diagnosis not present

## 2016-07-03 DIAGNOSIS — I480 Paroxysmal atrial fibrillation: Secondary | ICD-10-CM

## 2016-07-03 DIAGNOSIS — R9431 Abnormal electrocardiogram [ECG] [EKG]: Secondary | ICD-10-CM | POA: Diagnosis not present

## 2016-07-03 DIAGNOSIS — I5023 Acute on chronic systolic (congestive) heart failure: Secondary | ICD-10-CM | POA: Diagnosis not present

## 2016-07-03 DIAGNOSIS — N179 Acute kidney failure, unspecified: Secondary | ICD-10-CM | POA: Diagnosis not present

## 2016-07-03 DIAGNOSIS — I2510 Atherosclerotic heart disease of native coronary artery without angina pectoris: Secondary | ICD-10-CM | POA: Diagnosis not present

## 2016-07-03 LAB — BASIC METABOLIC PANEL
ANION GAP: 14 (ref 5–15)
BUN: 80 mg/dL — ABNORMAL HIGH (ref 6–20)
CHLORIDE: 87 mmol/L — AB (ref 101–111)
CO2: 26 mmol/L (ref 22–32)
Calcium: 8.5 mg/dL — ABNORMAL LOW (ref 8.9–10.3)
Creatinine, Ser: 2.78 mg/dL — ABNORMAL HIGH (ref 0.44–1.00)
GFR calc Af Amer: 19 mL/min — ABNORMAL LOW (ref >60)
GFR, EST NON AFRICAN AMERICAN: 17 mL/min — AB (ref >60)
GLUCOSE: 266 mg/dL — AB (ref 65–99)
POTASSIUM: 4.9 mmol/L (ref 3.5–5.1)
SODIUM: 127 mmol/L — AB (ref 135–145)

## 2016-07-03 LAB — BRAIN NATRIURETIC PEPTIDE: B Natriuretic Peptide: 3628.8 pg/mL — ABNORMAL HIGH (ref 0.0–100.0)

## 2016-07-03 MED ORDER — METOLAZONE 2.5 MG PO TABS: 2.5000 mg | ORAL_TABLET | ORAL | 2 refills | Status: DC | PRN

## 2016-07-03 MED ORDER — TORSEMIDE 20 MG PO TABS: 40.0000 mg | ORAL_TABLET | Freq: Two times a day (BID) | ORAL | 2 refills | Status: DC

## 2016-07-03 MED ORDER — POTASSIUM CHLORIDE CRYS ER 20 MEQ PO TBCR: 20.0000 meq | EXTENDED_RELEASE_TABLET | Freq: Two times a day (BID) | ORAL | 3 refills | Status: DC

## 2016-07-03 NOTE — Progress Notes (Signed)
[facility] Note   Primary Cardiologist: Dr. Fletcher Anon Primary HF: Dr. Haroldine Laws   HPI:  Theresa Rogers is a 66 y.o. female with CAD s/p CABG x 3 (left internal mammary artery to left anterior descending, saphenous vein graft to diagonal,saphenous vein graft to distal right coronary artery), ICM/chronic systolic CHF, PAF (diagnosed 06/2015), DM2, CKD stage III, HTN, HLD, and anemia.  Transferred from [facility] 05/20/16 with failure to diurese with IV lasix and milrinone. L/RHC as below.  Dr Prescott Gum consulted for CABG consideration. Pt underwent CABG x 3 on 05/27/16 after medical optimization. Pressors weaned as tolerated. Did require IV amiodarone post op for Atrial fibrillation.  Creatinine 1.81 on admission but rose to 3.81 post op requiring HD. Nephrology followed closely. Pt ultimately refused HD moving forward, only to consider as a last resort.  Thought to be making progress but volume overloaded on day of discharge, but pt adamant she was going home so all arrangements made with close follow up. Pt was aware of risk of needed dialysis evenetually.    She presents today for post hospital follow up. Weight up 13 lbs since discharge. Weight at home 142-145. Has been more SOB, but still able to do what she wants. Can walk on flat ground/around house for ~ 10 minutes. Denies orthopnea, PND, CP, palpitations.  Denies dizziness or lightheadedness. Remains very fatigued. Feels about 75% of her "best".  Sees renal first week of February and sees Surgery next week.  Right/Left Heart Cath and Coronary Angiography by Arida on 05/20/2016: Conclusion   Prox RCA lesion, 70 %stenosed.  Dist RCA lesion, 80 %stenosed.  Prox RCA to Mid RCA lesion, 0 %stenosed.  Mid Cx lesion, 0 %stenosed.  Dist LAD lesion, 90 %stenosed.  Mid LAD lesion, 95 %stenosed.  Prox Cx lesion, 30 %stenosed.  Mid RCA lesion, 90 %stenosed.  Hemodynamic findings consistent with moderate pulmonary  hypertension.  1. Severely elevated filling pressures with an LVEDP of 33 mmHg, moderate pulmonary hypertension with a mean pressure of 40 mmHg and mildly reduced cardiac output at 3.98 with a cardiac index of 2.26. These numbers were on milrinone infusion and multiple days of IV diuresis.  2. Significant three-vessel coronary artery disease with patent stent in the left circumflex. Significant progression of mid LAD disease over the last 10 months and diffuse obstructive disease affecting the right coronary artery in multiple areas   Past Medical History:  Diagnosis Date  . Anemia   . CAD (coronary artery disease)    a. s/p MI/syncope in 2009 s/p remote stenting; b. NSTEMI 06/2015, cardiac cath 06/2015 pLAD 70% FFR 0.88 not clinically sig, dLAD 90%, mLCx 80% s/p PCI/DES 0%, pRCA 70%, mRCA 80%, dRCA 80%; c. cath 05/2016 mLAD 95%, dLAD 90%, dLAD 90%, pRCA 70%, mRCA 90%, dRCA 80%  . Chronic systolic CHF (congestive heart failure) ([place])   . CKD (chronic kidney disease), stage III   . Diabetes mellitus without complication ([place])   . HLD (hyperlipidemia)   . HTN (hypertension)   . Ischemic cardiomyopathy    a. echo 06/2015: EF 30-35%, diffuse HK, nable to exclude RWMA, LV diastolic function parameters were normal, mild MR, left atrium was mildly dilated, PASP 28 mm Hg, IVC was dilated c/w elevated CVP; b. echo 05/2016: EF 20%, severe global HK with AK in ant, anteroseptal, apical wall, GR1DD, mild MR, mod dilated LA, RV sys fxn mod reduced, PASP 53 mmHg, triv pericardial effusion, L pleural effusion  . PAF (paroxysmal atrial fibrillation) (  [place])    a. new onset 06/2015; b. CHADS2VASc = 5 (CHF, HTN, age x 1, vascular dz, female); c. not on long term full dose anticoagulation in an effort to avoid triple therapy given her anemia     Current Outpatient Prescriptions  Medication Sig Dispense Refill  . amiodarone (PACERONE) 200 MG tablet Take 1 tablet (200 mg total) by mouth 2 (two) times daily. 30  tablet 1  . atorvastatin (LIPITOR) 40 MG tablet Take 1 tablet (40 mg total) by mouth daily at 6 PM. 30 tablet 0  . furosemide (LASIX) 80 MG tablet Take 1 tablet (80 mg total) by mouth 2 (two) times daily. 14 tablet 0  . insulin glargine (LANTUS) 100 UNIT/ML injection Inject 15 Units into the skin at bedtime.     . insulin lispro (HUMALOG) 100 UNIT/ML injection Inject 5-12 Units into the skin 3 (three) times daily before meals. Per sliding scale    . potassium chloride SA (K-DUR,KLOR-CON) 20 MEQ tablet Take 1 tablet (20 mEq total) by mouth 2 (two) times daily. 60 tablet 3  . warfarin (COUMADIN) 1 MG tablet Take as directed by Coumadin Clinic 30 tablet 1   No current facility-administered medications for this encounter.    Allergies  Allergen Reactions  . No Known Allergies    Social History   Social History  . Marital status: Divorced    Spouse name: N/A  . Number of children: N/A  . Years of education: N/A   Occupational History  . Not on file.   Social History Main Topics  . Smoking status: Never Smoker  . Smokeless tobacco: Never Used  . Alcohol use No  . Drug use: No  . Sexual activity: Not Currently   Other Topics Concern  . Not on file   Social History Narrative  . No narrative on file      Family History  Problem Relation Age of Onset  . CAD Mother   . Alzheimer's disease Mother   . Prostate cancer Father   . Diabetes Brother   . Kidney failure Brother     Vitals:   07/03/16 1438  BP: (!) 152/70  Pulse: (!) 57  SpO2: (!) 89%  Weight: 159 lb 9.6 oz (72.4 kg)   Wt Readings from Last 3 Encounters:  07/03/16 159 lb 9.6 oz (72.4 kg)  06/14/16 146 lb 14.4 oz (66.6 kg)  05/20/16 155 lb 3.3 oz (70.4 kg)     PHYSICAL EXAM: General:  Well appearing. NAD at rest.  HEENT: Normal Neck: supple. JVD elevated to ear.  Carotids 2+ bilat; no bruits. No lymphadenopathy or thyromegaly appreciated. Cor: PMI nondisplaced. Regular rate & rhythm. No rubs, gallops or  murmurs. Sternotomy scar well healed.  Lungs: Diminished basilar sounds.  Abdomen: soft, NT, mildly distended, no HSM. No bruits or masses. +BS  Extremities: no cyanosis, clubbing, rash, edema Neuro: alert & oriented x 3, cranial nerves grossly intact. moves all 4 extremities w/o difficulty. Affect pleasant.  ECG: Sinus bradycardia 55 bpm   ASSESSMENT & PLAN:  1.Acute on chronic systolic HF due to Ischemic cardiomyopathy, EF 20% with mild LV dilation on 12/17 echo.  - Volume overloaded on exam.   - Stop lasix. Start torsemide 40 mg BID and take 2.5 mg metolazone x 2 days with extra 20 meq of potassium. Repeat as needed for weight gain.  - No ACE/ARB/ARNI with CDK IV.  - Had considered hydralazine, but will hold off and preserve renal perfusion for now  with increased diuresis. Consider at next visit or per renal.  2. NSTEMI with CAD-severe 2 vessel disease LAD RCA    - s/p CABG 12/18 with LIMA-LAD, SVG-RCA and SVG-diagonal - No chest pain. Surgical site stable.  - Continue statin.  - Refer Cardiac Rehab.  3. AKI on CKD III-IV: - Required CVVH then HD recent admission - Suspect renal function will remain poor. Sees renal in next 10 days. Suspect will need HD consideration.  4. Uncontrolled DM- Hgb AC 11.3  - Needs to follow up with PCP.  5. PAF: on amiodarone.  - NSR/Brady by tele today - Continue amio and coumadin.  6. HTN - Will not add hydralazine for now with active diuretic adjustment and need to preserve renal perfusion.  - Consider at next visit and depending on renal follow up.   Meds as above. Labs today and repeat next week in [place].  Worry about renal function even short term.  Urged to move up renal appt if she worsens.   Shirley Friar, PA-C 07/03/16   Patient seen and examined with Oda Kilts, PA-C. We discussed all aspects of the encounter. I agree with the assessment and plan as stated above.   She is s/p recent CABG. Denies angina. EF low. Required  milrinone during hospitalization. Volume status and renal function worsening. Will switch lasix to torsemide and give metolazone. Has f/u with Nephrology soon. Suspect she may be headed toward HD but severe LV dysfunction may complicate. [place]ians in NSR on amio. Continue for now. Will need CR at [facility].   Bensimhon, Daniel,MD 10:16 PM

## 2016-07-03 NOTE — Patient Instructions (Addendum)
Labs today (will call for abnormal results, otherwise no news is good news)  STOP taking lasix  START taking Torsemide 40 mg (2 Tablets) Two times daily  Take Metolazone 2.5 mg (1 Tablet) Once Daily for 2 days, increase potassium by 20 mEq (1 Tablet) with your metolazone dose.  You may take Metolazone once a week as needed for weight gain or swelling. Take an extra potassium anytime you take metolazone.  Please take prescription and have labs drawn next week.  Cardiac Rehab has been ordered for you, they will contact you.  Follow up in 4 weeks

## 2016-07-05 ENCOUNTER — Ambulatory Visit (INDEPENDENT_AMBULATORY_CARE_PROVIDER_SITE_OTHER): Payer: Medicaid Other

## 2016-07-05 DIAGNOSIS — I480 Paroxysmal atrial fibrillation: Secondary | ICD-10-CM

## 2016-07-05 LAB — POCT INR: INR: 1.5

## 2016-07-10 ENCOUNTER — Encounter: Payer: Medicaid Other | Admitting: Cardiothoracic Surgery

## 2016-07-11 ENCOUNTER — Ambulatory Visit (INDEPENDENT_AMBULATORY_CARE_PROVIDER_SITE_OTHER): Payer: Medicaid Other | Admitting: Cardiovascular Disease

## 2016-07-11 DIAGNOSIS — I480 Paroxysmal atrial fibrillation: Secondary | ICD-10-CM

## 2016-07-11 LAB — POCT INR: INR: 1.1

## 2016-07-12 ENCOUNTER — Other Ambulatory Visit: Payer: Self-pay | Admitting: Cardiothoracic Surgery

## 2016-07-12 DIAGNOSIS — Z951 Presence of aortocoronary bypass graft: Secondary | ICD-10-CM

## 2016-07-15 ENCOUNTER — Ambulatory Visit (INDEPENDENT_AMBULATORY_CARE_PROVIDER_SITE_OTHER): Payer: Self-pay | Admitting: Physician Assistant

## 2016-07-15 ENCOUNTER — Other Ambulatory Visit: Payer: Self-pay | Admitting: *Deleted

## 2016-07-15 ENCOUNTER — Ambulatory Visit
Admission: RE | Admit: 2016-07-15 | Discharge: 2016-07-15 | Disposition: A | Discharge status: Home / Self Care | Payer: Medicare Other | Source: Ambulatory Visit | Attending: Cardiothoracic Surgery | Admitting: Cardiothoracic Surgery

## 2016-07-15 VITALS — BP 154/66 | HR 70 | Resp 20 | Ht 64.0 in | Wt 158.0 lb

## 2016-07-15 DIAGNOSIS — Z951 Presence of aortocoronary bypass graft: Secondary | ICD-10-CM

## 2016-07-15 DIAGNOSIS — J90 Pleural effusion, not elsewhere classified: Secondary | ICD-10-CM

## 2016-07-15 DIAGNOSIS — R0602 Shortness of breath: Secondary | ICD-10-CM | POA: Diagnosis not present

## 2016-07-15 MED ORDER — AMIODARONE HCL 200 MG PO TABS: 200.0000 mg | ORAL_TABLET | Freq: Two times a day (BID) | ORAL | 1 refills | Status: DC

## 2016-07-15 NOTE — Progress Notes (Signed)
HPI: Patient returns for routine postoperative follow-up having undergone CABG x 3 on 05/27/2016 The patient's early postoperative recovery while in the hospital was notable for HF with prolong use of Milrinone,  Stage IV CKD on HD post operatively but ultimately refused permanent dialysis. A. Fib on Coumadin.  Since hospital discharge the patient reports she is doing well for the most part.  She states she can pretty much do what she wants and has been able to get up and move around more as of late.  She was evaluated by Dr. Sung Amabile on 1/24 at which time adjustments were made to her diuretic regimen as her weight was up since hospital discharge.  The patient states she notices a big difference since starting her new diuretic regimen.  She denies shortness of breath and orthopnea.  She also states she has some swelling at her incision site on her left leg.  Current Outpatient Prescriptions  Medication Sig Dispense Refill  . amiodarone (PACERONE) 200 MG tablet Take 1 tablet (200 mg total) by mouth 2 (two) times daily. 30 tablet 1  . atorvastatin (LIPITOR) 40 MG tablet Take 1 tablet (40 mg total) by mouth daily at 6 PM. 30 tablet 0  . insulin glargine (LANTUS) 100 UNIT/ML injection Inject 15 Units into the skin at bedtime.     . insulin lispro (HUMALOG) 100 UNIT/ML injection Inject 5-12 Units into the skin 3 (three) times daily before meals. Per sliding scale    . metolazone (ZAROXOLYN) 2.5 MG tablet Take 1 tablet (2.5 mg total) by mouth as needed (for weight gain or swelling). 6 tablet 2  . potassium chloride SA (K-DUR,KLOR-CON) 20 MEQ tablet Take 1 tablet (20 mEq total) by mouth 2 (two) times daily. (Patient taking differently: Take 20 mEq by mouth 3 (three) times daily. ) 60 tablet 3  . torsemide (DEMADEX) 20 MG tablet Take 2 tablets (40 mg total) by mouth 2 (two) times daily. 120 tablet 2  . warfarin (COUMADIN) 1 MG tablet Take as directed by Coumadin Clinic 30 tablet 1   No current  facility-administered medications for this visit.     Physical Exam:  BP (!) 154/66   Pulse 70   Resp 20   Ht 5\' 4"  (1.626 m)   Wt 158 lb (71.7 kg)   SpO2 93% Comment: RA  BMI 27.12 kg/m   Gen: no apparent distress, looks great Heart: RRR Lungs: Diminished breath sounds on right base Abd: soft non-tender, non-distended Extemities: mild 1+ pitting edema Incisions: well healed, left EVH incision has small seroma present  Diagnostic Tests:  CXR:  Increase in right pleural effusion, small left pleural effusion  A/P:  1.  Acute on Chronic Systolic CHF due to Ischemic Cardiomyopathy, EF 20%- followed closely by HF clinic.... Gave refill of Amiodarone, diuretics per HF, on Coumadin last INR was 1.1 2. Left Lower Extremity- drained with successful removal of 15 cc of clear serous fluid 3. Right Pleural effusion- will refer for Thoracentesis 4. Stage IV CKD- refused dialysis in hospital for permanent basis, creatinine remains elevated, scheduled to see nephrologist on Monday 5. Dispo- patient looks better that she does clinically on paper, refer for Thoracentesis, HF per AHF clinic... RTC in 4 weeks with CXR with PVT  Ellwood Handler, PA-C [facility] [phone]

## 2016-07-15 NOTE — Patient Instructions (Signed)
You may return to driving an automobile as long as you are no longer requiring oral narcotic pain relievers during the daytime.  It would be wise to start driving only short distances during the daylight and gradually increase from there as you feel comfortable.   You may gradually increase your physical activity as tolerated without any particular limitations at this time.

## 2016-07-18 ENCOUNTER — Ambulatory Visit (INDEPENDENT_AMBULATORY_CARE_PROVIDER_SITE_OTHER): Payer: Medicare Other | Admitting: Cardiovascular Disease

## 2016-07-18 DIAGNOSIS — I480 Paroxysmal atrial fibrillation: Secondary | ICD-10-CM

## 2016-07-18 LAB — POCT INR: INR: 1.2

## 2016-07-22 ENCOUNTER — Ambulatory Visit (HOSPITAL_COMMUNITY): Payer: Medicare Other

## 2016-07-22 DIAGNOSIS — R809 Proteinuria, unspecified: Secondary | ICD-10-CM | POA: Diagnosis not present

## 2016-07-22 DIAGNOSIS — E1122 Type 2 diabetes mellitus with diabetic chronic kidney disease: Secondary | ICD-10-CM | POA: Diagnosis not present

## 2016-07-22 DIAGNOSIS — D631 Anemia in chronic kidney disease: Secondary | ICD-10-CM | POA: Diagnosis not present

## 2016-07-22 DIAGNOSIS — N183 Chronic kidney disease, stage 3 (moderate): Secondary | ICD-10-CM | POA: Diagnosis not present

## 2016-07-22 DIAGNOSIS — I129 Hypertensive chronic kidney disease with stage 1 through stage 4 chronic kidney disease, or unspecified chronic kidney disease: Secondary | ICD-10-CM | POA: Diagnosis not present

## 2016-07-23 ENCOUNTER — Ambulatory Visit (HOSPITAL_COMMUNITY): Payer: Medicare Other

## 2016-07-26 ENCOUNTER — Ambulatory Visit (HOSPITAL_COMMUNITY)
Admission: RE | Admit: 2016-07-26 | Discharge: 2016-07-26 | Disposition: A | Discharge status: Home / Self Care | Payer: Medicare Other | Source: Ambulatory Visit | Attending: Physician Assistant | Admitting: Physician Assistant

## 2016-07-26 ENCOUNTER — Telehealth: Payer: Self-pay | Admitting: *Deleted

## 2016-07-26 ENCOUNTER — Ambulatory Visit (HOSPITAL_COMMUNITY)
Admission: RE | Admit: 2016-07-26 | Discharge: 2016-07-26 | Disposition: A | Discharge status: Home / Self Care | Payer: Medicare Other | Source: Ambulatory Visit | Attending: Radiology | Admitting: Radiology

## 2016-07-26 DIAGNOSIS — J90 Pleural effusion, not elsewhere classified: Secondary | ICD-10-CM

## 2016-07-26 DIAGNOSIS — Z951 Presence of aortocoronary bypass graft: Secondary | ICD-10-CM | POA: Insufficient documentation

## 2016-07-26 DIAGNOSIS — Z9889 Other specified postprocedural states: Secondary | ICD-10-CM | POA: Insufficient documentation

## 2016-07-26 NOTE — Procedures (Signed)
Ultrasound-guided  therapeutic right thoracentesis performed yielding 1.2 liters of yellow fluid. No immediate complications. Follow-up chest x-ray pending.

## 2016-07-26 NOTE — Telephone Encounter (Signed)
Spoke with [place] with [facility] who states she called pt and she said was having thoracentesis today and was called on Monday and instructed to hold coumadin for 3 days before Thoracentesis INR cancelled today  Spoke with pt and she confirmed the above statement and pt instructed to ask MD doing procedure when to restart coumadin and when instructed to restart coumadin take extra 1/2 tablet for 1 day and then continue on same dose 0.5mg  daily except 1 mg on MWF and pt states understanding of these instructions Dosing was discussed with Tana Coast PharmD  Called and spoke with Larena Glassman again and gave her these orders and to recheck INR  on Thursday  Feb 22nd

## 2016-07-31 ENCOUNTER — Encounter (HOSPITAL_COMMUNITY): Payer: Medicaid Other

## 2016-08-01 ENCOUNTER — Ambulatory Visit (INDEPENDENT_AMBULATORY_CARE_PROVIDER_SITE_OTHER): Payer: Medicare Other | Admitting: Cardiology

## 2016-08-01 DIAGNOSIS — I480 Paroxysmal atrial fibrillation: Secondary | ICD-10-CM

## 2016-08-01 LAB — POCT INR: INR: 1.1

## 2016-08-08 ENCOUNTER — Ambulatory Visit (INDEPENDENT_AMBULATORY_CARE_PROVIDER_SITE_OTHER): Payer: Medicare Other | Admitting: Cardiovascular Disease

## 2016-08-08 DIAGNOSIS — I480 Paroxysmal atrial fibrillation: Secondary | ICD-10-CM

## 2016-08-08 LAB — POCT INR: INR: 1.3

## 2016-08-13 ENCOUNTER — Encounter (HOSPITAL_COMMUNITY): Payer: Self-pay

## 2016-08-13 ENCOUNTER — Ambulatory Visit (HOSPITAL_COMMUNITY)
Admission: RE | Admit: 2016-08-13 | Discharge: 2016-08-13 | Disposition: A | Discharge status: Home / Self Care | Payer: Medicare Other | Source: Ambulatory Visit | Attending: Cardiology | Admitting: Cardiology

## 2016-08-13 VITALS — BP 154/80 | HR 81 | Wt 137.2 lb

## 2016-08-13 DIAGNOSIS — N184 Chronic kidney disease, stage 4 (severe): Secondary | ICD-10-CM | POA: Diagnosis not present

## 2016-08-13 DIAGNOSIS — N183 Chronic kidney disease, stage 3 unspecified: Secondary | ICD-10-CM

## 2016-08-13 DIAGNOSIS — I5022 Chronic systolic (congestive) heart failure: Secondary | ICD-10-CM | POA: Diagnosis not present

## 2016-08-13 DIAGNOSIS — Z79899 Other long term (current) drug therapy: Secondary | ICD-10-CM | POA: Insufficient documentation

## 2016-08-13 DIAGNOSIS — I2510 Atherosclerotic heart disease of native coronary artery without angina pectoris: Secondary | ICD-10-CM | POA: Insufficient documentation

## 2016-08-13 DIAGNOSIS — D649 Anemia, unspecified: Secondary | ICD-10-CM | POA: Insufficient documentation

## 2016-08-13 DIAGNOSIS — E1165 Type 2 diabetes mellitus with hyperglycemia: Secondary | ICD-10-CM | POA: Insufficient documentation

## 2016-08-13 DIAGNOSIS — I480 Paroxysmal atrial fibrillation: Secondary | ICD-10-CM

## 2016-08-13 DIAGNOSIS — Z794 Long term (current) use of insulin: Secondary | ICD-10-CM | POA: Diagnosis not present

## 2016-08-13 DIAGNOSIS — Z7901 Long term (current) use of anticoagulants: Secondary | ICD-10-CM | POA: Diagnosis not present

## 2016-08-13 DIAGNOSIS — Z9889 Other specified postprocedural states: Secondary | ICD-10-CM | POA: Diagnosis not present

## 2016-08-13 DIAGNOSIS — Z951 Presence of aortocoronary bypass graft: Secondary | ICD-10-CM

## 2016-08-13 DIAGNOSIS — I255 Ischemic cardiomyopathy: Secondary | ICD-10-CM | POA: Diagnosis not present

## 2016-08-13 DIAGNOSIS — E1122 Type 2 diabetes mellitus with diabetic chronic kidney disease: Secondary | ICD-10-CM | POA: Diagnosis not present

## 2016-08-13 DIAGNOSIS — E785 Hyperlipidemia, unspecified: Secondary | ICD-10-CM | POA: Diagnosis not present

## 2016-08-13 DIAGNOSIS — I130 Hypertensive heart and chronic kidney disease with heart failure and stage 1 through stage 4 chronic kidney disease, or unspecified chronic kidney disease: Secondary | ICD-10-CM | POA: Insufficient documentation

## 2016-08-13 LAB — COMPREHENSIVE METABOLIC PANEL
ALK PHOS: 398 U/L — AB (ref 38–126)
ALT: 31 U/L (ref 14–54)
ANION GAP: 12 (ref 5–15)
AST: 29 U/L (ref 15–41)
Albumin: 3.2 g/dL — ABNORMAL LOW (ref 3.5–5.0)
BILIRUBIN TOTAL: 1 mg/dL (ref 0.3–1.2)
BUN: 65 mg/dL — AB (ref 6–20)
CALCIUM: 9.1 mg/dL (ref 8.9–10.3)
CO2: 28 mmol/L (ref 22–32)
Chloride: 92 mmol/L — ABNORMAL LOW (ref 101–111)
Creatinine, Ser: 2.29 mg/dL — ABNORMAL HIGH (ref 0.44–1.00)
GFR calc Af Amer: 25 mL/min — ABNORMAL LOW (ref >60)
GFR calc non Af Amer: 21 mL/min — ABNORMAL LOW (ref >60)
Glucose, Bld: 388 mg/dL — ABNORMAL HIGH (ref 65–99)
POTASSIUM: 4.8 mmol/L (ref 3.5–5.1)
Sodium: 132 mmol/L — ABNORMAL LOW (ref 135–145)
TOTAL PROTEIN: 7.1 g/dL (ref 6.5–8.1)

## 2016-08-13 LAB — BRAIN NATRIURETIC PEPTIDE: B NATRIURETIC PEPTIDE 5: 2877.9 pg/mL — AB (ref 0.0–100.0)

## 2016-08-13 LAB — TSH: TSH: 15.711 u[IU]/mL — ABNORMAL HIGH (ref 0.350–4.500)

## 2016-08-13 MED ORDER — AMIODARONE HCL 200 MG PO TABS: 200.0000 mg | ORAL_TABLET | Freq: Every day | ORAL | 1 refills | Status: DC

## 2016-08-13 MED ORDER — CARVEDILOL 3.125 MG PO TABS: 3.1250 mg | ORAL_TABLET | Freq: Two times a day (BID) | ORAL | 3 refills | Status: DC

## 2016-08-13 NOTE — Progress Notes (Signed)
Advanced Heart Failure Medication Review by a Pharmacist  Does the patient  feel that his/her medications are working for him/her?  yes  Has the patient been experiencing any side effects to the medications prescribed?  no  Does the patient measure his/her own blood pressure or blood glucose at home?  yes   Does the patient have any problems obtaining medications due to transportation or finances?   no  Understanding of regimen: good Understanding of indications: good Potential of compliance: good Patient understands to avoid NSAIDs. Patient understands to avoid decongestants.  Issues to address at subsequent visits: None   Pharmacist comments: Theresa Rogers is a pleasant 65 yoF presenting to the advanced HF clinic today. She is taking her medications appropriately, and is only concerned about warfarin. She is frustrated about the dose changing every week. We discussed the importance of this medication, including reducing the risk of stroke in AFib patients. We also talked about how warfarin dosing varies per person, and that it may take awhile before her dose is consistent. We spoke about consistent greens intake and any new medications. She voiced no other concerns or questions.     Time with patient: 10 min Preparation and documentation time: 2 min Total time: 12 min

## 2016-08-13 NOTE — Progress Notes (Signed)
Advanced Heart Failure Clinic Note   Primary Cardiologist: Dr. Fletcher Anon Primary HF: Dr. Haroldine Laws   HPI:  Theresa Rogers is a 66 y.o. female with CAD s/p CABG x 3 (left internal mammary artery to left anterior descending, saphenous vein graft to diagonal,saphenous vein graft to distal right coronary artery), ICM/chronic systolic CHF, PAF (diagnosed 06/2015), DM2, CKD stage III, HTN, HLD, and anemia.  Transferred from [facility] 05/20/16 with failure to diurese with IV lasix and milrinone. L/RHC as below.  Dr Prescott Gum consulted for CABG consideration. Pt underwent CABG x 3 on 05/27/16 after medical optimization. Pressors weaned as tolerated. Did require IV amiodarone post op for Atrial fibrillation.  Creatinine 1.81 on admission but rose to 3.81 post op requiring HD. Nephrology followed closely. Pt ultimately refused HD moving forward, only to consider as a last resort.  Thought to be making progress but volume overloaded on day of discharge, but pt adamant she was going home so all arrangements made with close follow up. Pt was aware of risk of needed dialysis evenetually.    She presents today for follow up. At last visit switched to torsemide. Having a lot of urine output and is clear to yellow. Weight down 22 lbs from that visit ( she was up ~ 10 lbs at that visit). Has seen renal. Saw a few weeks ago.  Stated her renal function improved over past month and recommended follow up in May. Also started her on iron supplementation. Feels 100% back to how she felt prior to CABG. Has been babysitting her 3 grandchildren without any difficulties.  Does get mild SOB with stairs. Denies lightheadedness or dizziness.   Right/Left Heart Cath and Coronary Angiography by Arida on 05/20/2016: Conclusion   Prox RCA lesion, 70 %stenosed.  Dist RCA lesion, 80 %stenosed.  Prox RCA to Mid RCA lesion, 0 %stenosed.  Mid Cx lesion, 0 %stenosed.  Dist LAD lesion, 90 %stenosed.  Mid LAD lesion, 95  %stenosed.  Prox Cx lesion, 30 %stenosed.  Mid RCA lesion, 90 %stenosed.  Hemodynamic findings consistent with moderate pulmonary hypertension.  1. Severely elevated filling pressures with an LVEDP of 33 mmHg, moderate pulmonary hypertension with a mean pressure of 40 mmHg and mildly reduced cardiac output at 3.98 with a cardiac index of 2.26. These numbers were on milrinone infusion and multiple days of IV diuresis.  2. Significant three-vessel coronary artery disease with patent stent in the left circumflex. Significant progression of mid LAD disease over the last 10 months and diffuse obstructive disease affecting the right coronary artery in multiple areas   Past Medical History:  Diagnosis Date  . Anemia   . CAD (coronary artery disease)    a. s/p MI/syncope in 2009 s/p remote stenting; b. NSTEMI 06/2015, cardiac cath 06/2015 pLAD 70% FFR 0.88 not clinically sig, dLAD 90%, mLCx 80% s/p PCI/DES 0%, pRCA 70%, mRCA 80%, dRCA 80%; c. cath 05/2016 mLAD 95%, dLAD 90%, dLAD 90%, pRCA 70%, mRCA 90%, dRCA 80%  . Chronic systolic CHF (congestive heart failure) ([place])   . CKD (chronic kidney disease), stage III   . Diabetes mellitus without complication ([place])   . HLD (hyperlipidemia)   . HTN (hypertension)   . Ischemic cardiomyopathy    a. echo 06/2015: EF 30-35%, diffuse HK, nable to exclude RWMA, LV diastolic function parameters were normal, mild MR, left atrium was mildly dilated, PASP 28 mm Hg, IVC was dilated c/w elevated CVP; b. echo 05/2016: EF 20%, severe global HK with AK in ant,  anteroseptal, apical wall, GR1DD, mild MR, mod dilated LA, RV sys fxn mod reduced, PASP 53 mmHg, triv pericardial effusion, L pleural effusion  . PAF (paroxysmal atrial fibrillation) ([place])    a. new onset 06/2015; b. CHADS2VASc = 5 (CHF, HTN, age x 1, vascular dz, female); c. not on long term full dose anticoagulation in an effort to avoid triple therapy given her anemia     Current Outpatient Prescriptions   Medication Sig Dispense Refill  . amiodarone (PACERONE) 200 MG tablet Take 1 tablet (200 mg total) by mouth 2 (two) times daily. 30 tablet 1  . atorvastatin (LIPITOR) 40 MG tablet Take 1 tablet (40 mg total) by mouth daily at 6 PM. 30 tablet 0  . insulin glargine (LANTUS) 100 UNIT/ML injection Inject 15 Units into the skin at bedtime.     . insulin lispro (HUMALOG) 100 UNIT/ML injection Inject 5-12 Units into the skin 3 (three) times daily before meals. Per sliding scale    . metolazone (ZAROXOLYN) 2.5 MG tablet Take 1 tablet (2.5 mg total) by mouth as needed (for weight gain or swelling). 6 tablet 2  . potassium chloride SA (K-DUR,KLOR-CON) 20 MEQ tablet Take 1 tablet (20 mEq total) by mouth 2 (two) times daily. (Patient taking differently: Take 20 mEq by mouth 3 (three) times daily. ) 60 tablet 3  . torsemide (DEMADEX) 20 MG tablet Take 2 tablets (40 mg total) by mouth 2 (two) times daily. 120 tablet 2  . warfarin (COUMADIN) 1 MG tablet Take as directed by Coumadin Clinic 30 tablet 1   No current facility-administered medications for this encounter.    Allergies  Allergen Reactions  . No Known Allergies    Social History   Social History  . Marital status: Divorced    Spouse name: N/A  . Number of children: N/A  . Years of education: N/A   Occupational History  . Not on file.   Social History Main Topics  . Smoking status: Never Smoker  . Smokeless tobacco: Never Used  . Alcohol use No  . Drug use: No  . Sexual activity: Not Currently   Other Topics Concern  . Not on file   Social History Narrative  . No narrative on file      Family History  Problem Relation Age of Onset  . CAD Mother   . Alzheimer's disease Mother   . Prostate cancer Father   . Diabetes Brother   . Kidney failure Brother     Vitals:   08/13/16 1137  BP: (!) 154/80  Pulse: 81  SpO2: 97%  Weight: 137 lb 4 oz (62.3 kg)   Wt Readings from Last 3 Encounters:  08/13/16 137 lb 4 oz (62.3 kg)   07/15/16 158 lb (71.7 kg)  07/03/16 159 lb 9.6 oz (72.4 kg)     PHYSICAL EXAM: General:  Well appearing. NAD.   HEENT: Normal.  Neck: supple. JVD 8-9 cm. Carotids 2+ bilat; no bruits. No thyromegaly or nodule ntoed.  Cor: PMI nondisplaced. RRR. No M/G/R noted. Sternotomy scar well healed.  Lungs: CTAB, normal effort.  Abdomen: mildly obese, soft, NT, ND, no HSM. No bruits or masses. +BS.   Extremities: no cyanosis, clubbing, rash. No peripheral edema.  Neuro: alert & oriented x 3, cranial nerves grossly intact. moves all 4 extremities w/o difficulty. Affect pleasant.  ASSESSMENT & PLAN:  1. Chronic systolic HF due to Ischemic cardiomyopathy, EF 20% with mild LV dilation on 12/17 echo.  - Volume status  looks good on exam with switch to torsemide.  - Continue torsemide 40 mg BID.  - Only taking metolazone 2.5 mg as needed.  - No ACE/ARB/ARNI with CDK IV.  - Will try on low dose coreg at 3.125 mg BID.  - Reinforced fluid restriction to < 2 L daily, sodium restriction to less than 2000 mg daily, and the importance of daily weights.   2. NSTEMI with CAD-severe 2 vessel disease LAD RCA    - s/p CABG 12/18 with LIMA-LAD, SVG-RCA and SVG-diagonal - No chest pain. Sternotomy scar well healed.   - Continue statin.  - Refer Cardiac Rehab.  3. CKD III-IV: - Stable to improved. Follows with renal. No indications for dialysis at this time.  4. Uncontrolled DM- Hgb AC 11.3  - Will check Glucose via BMET today. Needs to follow PCP.  5. PAF: Worse after CABG, but had isolated incidents prior to.  - NSR on exam.  - Will cut back amiodarone to 200 mg daily.  - Continue coumadin. Dosing per coumadin clinic. Denies bleeding.  6. HTN - Meds as above.   Coreg as above. CMET, TSH, and BNP today. Follow up 2 months. Sooner with symptoms.   Shirley Friar, PA-C 08/13/16   Total time spent > 25 minutes. Over half that spent discussing the above.

## 2016-08-13 NOTE — Patient Instructions (Signed)
DECREASE Amiodarone to 200mg  daily.   START Carvedilol 3.125mg  twice daily.  Routine lab work today. Will notify you of abnormal results  Follow up in 2 months

## 2016-08-14 ENCOUNTER — Ambulatory Visit (INDEPENDENT_AMBULATORY_CARE_PROVIDER_SITE_OTHER): Payer: Medicare Other | Admitting: Interventional Cardiology

## 2016-08-14 DIAGNOSIS — I480 Paroxysmal atrial fibrillation: Secondary | ICD-10-CM

## 2016-08-14 LAB — T3, FREE: T3 FREE: 2.8 pg/mL (ref 2.0–4.4)

## 2016-08-14 LAB — POCT INR: INR: 1.4

## 2016-08-16 ENCOUNTER — Telehealth (HOSPITAL_COMMUNITY): Payer: Self-pay

## 2016-08-16 NOTE — Telephone Encounter (Signed)
T3, free  Order: [id number]  Status:  Final result Visible to patient:  No (Not Released) Dx:  Chronic systolic CHF (congestive hear...  Notes Recorded by Effie Berkshire, RN on 08/16/2016 at 4:11 PM EST LVMTCB. ------  Notes Recorded by Kerry Dory, CMA on 08/14/2016 at 2:19 PM EST Unable to reach patient. No answer unable to leave messageH:[phone]   ------  Notes Recorded by Shirley Friar, PA-C on 08/14/2016 at 1:37 PM EST BMET OK.  TSH elevated but T3 low.    Please have her see her PCP ASAP for thyroid work up.    Legrand Como [address]" [place], PA-C 08/14/2016 1:37 PM

## 2016-08-19 ENCOUNTER — Encounter (HOSPITAL_COMMUNITY): Payer: Self-pay | Admitting: Cardiology

## 2016-08-19 ENCOUNTER — Other Ambulatory Visit: Payer: Self-pay | Admitting: Cardiothoracic Surgery

## 2016-08-19 DIAGNOSIS — Z951 Presence of aortocoronary bypass graft: Secondary | ICD-10-CM

## 2016-08-21 ENCOUNTER — Ambulatory Visit
Admission: RE | Admit: 2016-08-21 | Discharge: 2016-08-21 | Disposition: A | Discharge status: Home / Self Care | Payer: Medicare Other | Source: Ambulatory Visit | Attending: Cardiothoracic Surgery | Admitting: Cardiothoracic Surgery

## 2016-08-21 ENCOUNTER — Ambulatory Visit (INDEPENDENT_AMBULATORY_CARE_PROVIDER_SITE_OTHER): Payer: Self-pay | Admitting: Cardiothoracic Surgery

## 2016-08-21 ENCOUNTER — Ambulatory Visit (INDEPENDENT_AMBULATORY_CARE_PROVIDER_SITE_OTHER): Payer: Medicare Other

## 2016-08-21 VITALS — BP 170/86 | HR 74 | Resp 20 | Ht 64.0 in | Wt 139.0 lb

## 2016-08-21 DIAGNOSIS — I480 Paroxysmal atrial fibrillation: Secondary | ICD-10-CM

## 2016-08-21 DIAGNOSIS — Z5181 Encounter for therapeutic drug level monitoring: Secondary | ICD-10-CM

## 2016-08-21 DIAGNOSIS — I2510 Atherosclerotic heart disease of native coronary artery without angina pectoris: Secondary | ICD-10-CM

## 2016-08-21 DIAGNOSIS — Z951 Presence of aortocoronary bypass graft: Secondary | ICD-10-CM

## 2016-08-21 DIAGNOSIS — J90 Pleural effusion, not elsewhere classified: Secondary | ICD-10-CM | POA: Diagnosis not present

## 2016-08-21 LAB — POCT INR: INR: 2.8

## 2016-08-21 NOTE — Progress Notes (Signed)
PCP is Pcp Not In System Referring Provider is Wellington Hampshire, MD  Chief Complaint  Patient presents with  . Routine Post Op    4 week f/u with CXR    POE:UMPNT postop visit almost 3 months after urgent multivessel CABG for ischemic cardiomyopathy, preoperative cardiogenic shock. The patient is now doing well. She is followed at the outpatient advanced heart failure clinic. She is functional class II. Her postop right pleural effusion has resolved. She has no peripheral edema. She is on Coumadin and amiodarone 200 mg daily for postop atrial fibrillation, currently sinus rhythm. Surgical incisions are well-healed. She has no symptoms of angina or CHF.   Past Medical History:  Diagnosis Date  . Anemia   . CAD (coronary artery disease)    a. s/p MI/syncope in 2009 s/p remote stenting; b. NSTEMI 06/2015, cardiac cath 06/2015 pLAD 70% FFR 0.88 not clinically sig, dLAD 90%, mLCx 80% s/p PCI/DES 0%, pRCA 70%, mRCA 80%, dRCA 80%; c. cath 05/2016 mLAD 95%, dLAD 90%, dLAD 90%, pRCA 70%, mRCA 90%, dRCA 80%  . Chronic systolic CHF (congestive heart failure) ([place])   . CKD (chronic kidney disease), stage III   . Diabetes mellitus without complication ([place])   . HLD (hyperlipidemia)   . HTN (hypertension)   . Ischemic cardiomyopathy    a. echo 06/2015: EF 30-35%, diffuse HK, nable to exclude RWMA, LV diastolic function parameters were normal, mild MR, left atrium was mildly dilated, PASP 28 mm Hg, IVC was dilated c/w elevated CVP; b. echo 05/2016: EF 20%, severe global HK with AK in ant, anteroseptal, apical wall, GR1DD, mild MR, mod dilated LA, RV sys fxn mod reduced, PASP 53 mmHg, triv pericardial effusion, L pleural effusion  . PAF (paroxysmal atrial fibrillation) ([place])    a. new onset 06/2015; b. CHADS2VASc = 5 (CHF, HTN, age x 1, vascular dz, female); c. not on long term full dose anticoagulation in an effort to avoid triple therapy given her anemia     Past Surgical History:  Procedure Laterality  Date  . CARDIAC CATHETERIZATION N/A 06/22/2015   Procedure: Coronary Angiogram;  Surgeon: Minna Merritts, MD;  Location: [facility];  Service: Cardiovascular;  Laterality: N/A;  . CARDIAC CATHETERIZATION N/A 06/26/2015   Procedure: Coronary Stent Intervention;  Surgeon: Wellington Hampshire, MD;  Location: [facility];  Service: Cardiovascular;  Laterality: N/A;  . CARDIAC CATHETERIZATION N/A 08/03/2015   Procedure: Coronary Stent Intervention;  Surgeon: Wellington Hampshire, MD;  Location: [facility];  Service: Cardiovascular;  Laterality: N/A;  . CARDIAC CATHETERIZATION N/A 05/20/2016   Procedure: Right/Left Heart Cath and Coronary Angiography;  Surgeon: Wellington Hampshire, MD;  Location: [facility];  Service: Cardiovascular;  Laterality: N/A;  . CORONARY ANGIOPLASTY WITH STENT PLACEMENT    . CORONARY ARTERY BYPASS GRAFT N/A 05/27/2016   Procedure: CORONARY ARTERY BYPASS GRAFTING (CABG) x3 using left internal mammary artery and left greater saphenous vein harvested endoscopically;  Surgeon: Ivin Poot, MD;  Location: [facility];  Service: Open Heart Surgery;  Laterality: N/A;  . IR GENERIC HISTORICAL  06/04/2016   IR US GUIDE VASC ACCESS RIGHT 06/04/2016 Corrie Mckusick, DO [facility]  . IR GENERIC HISTORICAL  06/04/2016   IR FLUORO GUIDE CV LINE RIGHT 06/04/2016 Corrie Mckusick, DO [facility]  . TEE WITHOUT CARDIOVERSION N/A 05/27/2016   Procedure: TRANSESOPHAGEAL ECHOCARDIOGRAM (TEE);  Surgeon: Ivin Poot, MD;  Location: [facility];  Service: Open Heart Surgery;  Laterality: N/A;    Family History  Problem Relation Age of Onset  . CAD Mother   . Alzheimer's disease Mother   . Prostate cancer Father   . Diabetes Brother   . Kidney failure Brother     Social History Social History  Substance Use Topics  . Smoking status: Never Smoker  . Smokeless tobacco: Never Used  . Alcohol use No    Current Outpatient Prescriptions  Medication Sig Dispense  Refill  . amiodarone (PACERONE) 200 MG tablet Take 1 tablet (200 mg total) by mouth daily. 30 tablet 1  . atorvastatin (LIPITOR) 40 MG tablet Take 1 tablet (40 mg total) by mouth daily at 6 PM. 30 tablet 0  . carvedilol (COREG) 3.125 MG tablet Take 1 tablet (3.125 mg total) by mouth 2 (two) times daily with a meal. 60 tablet 3  . ferrous sulfate 325 (65 FE) MG EC tablet Take 325 mg by mouth daily.    . insulin glargine (LANTUS) 100 UNIT/ML injection Inject 12 Units into the skin at bedtime.    . insulin lispro (HUMALOG) 100 UNIT/ML injection Inject 5-12 Units into the skin 3 (three) times daily before meals. Per sliding scale    . metolazone (ZAROXOLYN) 2.5 MG tablet Take 1 tablet (2.5 mg total) by mouth as needed (for weight gain or swelling). 6 tablet 2  . potassium chloride SA (K-DUR,KLOR-CON) 20 MEQ tablet Take 1 tablet (20 mEq total) by mouth 2 (two) times daily. (Patient taking differently: Take 20 mEq by mouth 3 (three) times daily. ) 60 tablet 3  . torsemide (DEMADEX) 20 MG tablet Take 2 tablets (40 mg total) by mouth 2 (two) times daily. 120 tablet 2  . warfarin (COUMADIN) 1 MG tablet Take as directed by Coumadin Clinic 30 tablet 1   No current facility-administered medications for this visit.     Allergies  Allergen Reactions  . No Known Allergies     Review of Systems  Improved exercise tolerance Good appetite Stable weight No fever  BP (!) 170/86   Pulse 74   Resp 20   Ht 5\' 4"  (1.626 m)   Wt 139 lb (63 kg)   SpO2 98% Comment: RA  BMI 23.86 kg/m  Physical Exam      Exam    General- alert and comfortable   Lungs- clear without rales, wheezes   Cor- regular rate and rhythm, no murmur , gallop   Abdomen- soft, non-tender   Extremities - warm, non-tender, minimal edema   Neuro- oriented, appropriate, no focal weakness  Diagnostic Tests: Chest x-ray clear  Impression: Patient doing very well. Preoperative ejection fraction 20% She has not had a postop echo  yet. She will be followed at the advanced heart failure clinic.  Plan: The patient appears to be rehabilitated fairly well and should not need formal cardiac rehabilitation. She is currently occupied full time with caring for 3 grandchildren She will return as needed. Len Childs, MD [facility] [phone]

## 2016-09-04 ENCOUNTER — Ambulatory Visit (INDEPENDENT_AMBULATORY_CARE_PROVIDER_SITE_OTHER): Payer: Medicare Other

## 2016-09-04 DIAGNOSIS — I2510 Atherosclerotic heart disease of native coronary artery without angina pectoris: Secondary | ICD-10-CM

## 2016-09-04 DIAGNOSIS — Z5181 Encounter for therapeutic drug level monitoring: Secondary | ICD-10-CM | POA: Diagnosis not present

## 2016-09-04 DIAGNOSIS — I480 Paroxysmal atrial fibrillation: Secondary | ICD-10-CM

## 2016-09-04 LAB — POCT INR: INR: 3

## 2016-09-25 ENCOUNTER — Ambulatory Visit (INDEPENDENT_AMBULATORY_CARE_PROVIDER_SITE_OTHER): Payer: Medicare Other

## 2016-09-25 ENCOUNTER — Other Ambulatory Visit: Payer: Self-pay

## 2016-09-25 DIAGNOSIS — I480 Paroxysmal atrial fibrillation: Secondary | ICD-10-CM

## 2016-09-25 DIAGNOSIS — Z5181 Encounter for therapeutic drug level monitoring: Secondary | ICD-10-CM | POA: Diagnosis not present

## 2016-09-25 DIAGNOSIS — I2510 Atherosclerotic heart disease of native coronary artery without angina pectoris: Secondary | ICD-10-CM

## 2016-09-25 LAB — POCT INR: INR: 2.8

## 2016-09-25 MED ORDER — WARFARIN SODIUM 1 MG PO TABS: ORAL_TABLET | ORAL | 3 refills | Status: DC

## 2016-10-08 ENCOUNTER — Other Ambulatory Visit (HOSPITAL_COMMUNITY): Payer: Self-pay | Admitting: Internal Medicine

## 2016-10-08 ENCOUNTER — Other Ambulatory Visit (HOSPITAL_COMMUNITY): Payer: Self-pay | Admitting: Student

## 2016-10-16 ENCOUNTER — Other Ambulatory Visit (HOSPITAL_COMMUNITY): Payer: Self-pay | Admitting: Student

## 2016-10-21 ENCOUNTER — Ambulatory Visit (HOSPITAL_COMMUNITY)
Admission: RE | Admit: 2016-10-21 | Discharge: 2016-10-21 | Disposition: A | Discharge status: Home / Self Care | Payer: Medicare Other | Source: Ambulatory Visit | Attending: Cardiology | Admitting: Cardiology

## 2016-10-21 ENCOUNTER — Encounter (HOSPITAL_COMMUNITY): Payer: Self-pay

## 2016-10-21 ENCOUNTER — Other Ambulatory Visit (HOSPITAL_COMMUNITY): Payer: Self-pay | Admitting: Student

## 2016-10-21 VITALS — BP 142/78 | HR 75 | Wt 142.8 lb

## 2016-10-21 DIAGNOSIS — N183 Chronic kidney disease, stage 3 unspecified: Secondary | ICD-10-CM

## 2016-10-21 DIAGNOSIS — I480 Paroxysmal atrial fibrillation: Secondary | ICD-10-CM | POA: Diagnosis not present

## 2016-10-21 DIAGNOSIS — I5022 Chronic systolic (congestive) heart failure: Secondary | ICD-10-CM | POA: Diagnosis not present

## 2016-10-21 DIAGNOSIS — Z7982 Long term (current) use of aspirin: Secondary | ICD-10-CM | POA: Insufficient documentation

## 2016-10-21 DIAGNOSIS — E118 Type 2 diabetes mellitus with unspecified complications: Secondary | ICD-10-CM | POA: Diagnosis not present

## 2016-10-21 DIAGNOSIS — I255 Ischemic cardiomyopathy: Secondary | ICD-10-CM | POA: Insufficient documentation

## 2016-10-21 DIAGNOSIS — Z951 Presence of aortocoronary bypass graft: Secondary | ICD-10-CM

## 2016-10-21 DIAGNOSIS — Z7901 Long term (current) use of anticoagulants: Secondary | ICD-10-CM | POA: Diagnosis not present

## 2016-10-21 DIAGNOSIS — I2510 Atherosclerotic heart disease of native coronary artery without angina pectoris: Secondary | ICD-10-CM | POA: Diagnosis not present

## 2016-10-21 DIAGNOSIS — I130 Hypertensive heart and chronic kidney disease with heart failure and stage 1 through stage 4 chronic kidney disease, or unspecified chronic kidney disease: Secondary | ICD-10-CM | POA: Diagnosis not present

## 2016-10-21 DIAGNOSIS — E1122 Type 2 diabetes mellitus with diabetic chronic kidney disease: Secondary | ICD-10-CM | POA: Insufficient documentation

## 2016-10-21 DIAGNOSIS — I272 Pulmonary hypertension, unspecified: Secondary | ICD-10-CM | POA: Insufficient documentation

## 2016-10-21 DIAGNOSIS — Z79899 Other long term (current) drug therapy: Secondary | ICD-10-CM | POA: Insufficient documentation

## 2016-10-21 DIAGNOSIS — E785 Hyperlipidemia, unspecified: Secondary | ICD-10-CM | POA: Diagnosis not present

## 2016-10-21 DIAGNOSIS — Z794 Long term (current) use of insulin: Secondary | ICD-10-CM | POA: Insufficient documentation

## 2016-10-21 DIAGNOSIS — Z955 Presence of coronary angioplasty implant and graft: Secondary | ICD-10-CM | POA: Insufficient documentation

## 2016-10-21 DIAGNOSIS — D649 Anemia, unspecified: Secondary | ICD-10-CM | POA: Insufficient documentation

## 2016-10-21 LAB — TSH: TSH: 14.784 u[IU]/mL — ABNORMAL HIGH (ref 0.350–4.500)

## 2016-10-21 LAB — COMPREHENSIVE METABOLIC PANEL
ALBUMIN: 3.4 g/dL — AB (ref 3.5–5.0)
ALK PHOS: 230 U/L — AB (ref 38–126)
ALT: 25 U/L (ref 14–54)
ANION GAP: 15 (ref 5–15)
AST: 29 U/L (ref 15–41)
BUN: 75 mg/dL — ABNORMAL HIGH (ref 6–20)
CO2: 27 mmol/L (ref 22–32)
CREATININE: 2.55 mg/dL — AB (ref 0.44–1.00)
Calcium: 9 mg/dL (ref 8.9–10.3)
Chloride: 89 mmol/L — ABNORMAL LOW (ref 101–111)
GFR calc Af Amer: 22 mL/min — ABNORMAL LOW (ref >60)
GFR calc non Af Amer: 19 mL/min — ABNORMAL LOW (ref >60)
GLUCOSE: 508 mg/dL — AB (ref 65–99)
POTASSIUM: 4.3 mmol/L (ref 3.5–5.1)
SODIUM: 131 mmol/L — AB (ref 135–145)
Total Bilirubin: 0.9 mg/dL (ref 0.3–1.2)
Total Protein: 7 g/dL (ref 6.5–8.1)

## 2016-10-21 LAB — T4, FREE: FREE T4: 0.72 ng/dL (ref 0.61–1.12)

## 2016-10-21 MED ORDER — CARVEDILOL 6.25 MG PO TABS: 6.2500 mg | ORAL_TABLET | Freq: Two times a day (BID) | ORAL | 6 refills | Status: DC

## 2016-10-21 NOTE — Patient Instructions (Signed)
INCREASE Carvedilol (Coreg) to 6.25 mg twice daily. May "double up" on current 3.125 mg tablets (Take 2 tabs twice daily). New Rx has been sent to your pharmacy for 6.25 mg tablets (Take 1 tab twice daily).  Routine lab work today. Will notify you of abnormal results, otherwise no news is good news!  Follow up 3-4 months with echocardiogram and appointment with Dr. Haroldine Laws.  Do the following things EVERYDAY: 1) Weigh yourself in the morning before breakfast. Write it down and keep it in a log. 2) Take your medicines as prescribed 3) Eat low salt foods-Limit salt (sodium) to 2000 mg per day.  4) Stay as active as you can everyday 5) Limit all fluids for the day to less than 2 liters

## 2016-10-21 NOTE — Progress Notes (Signed)
Advanced Heart Failure Clinic Note   PCP: Dr. Magdalen Spatz [facility] Primary) Primary Cardiologist: Dr. Fletcher Anon Primary HF: Dr. Haroldine Laws   HPI:  Theresa Rogers is a 66 y.o. female with CAD s/p CABG x 3 (left internal mammary artery to left anterior descending, saphenous vein graft to diagonal,saphenous vein graft to distal right coronary artery), ICM/chronic systolic CHF, PAF (diagnosed 06/2015), DM2, CKD stage III, HTN, HLD, and anemia.  Transferred from [facility] 05/20/16 with failure to diurese with IV lasix and milrinone. L/RHC as below.  Dr Prescott Gum consulted for CABG consideration. Pt underwent CABG x 3 on 05/27/16 after medical optimization. Pressors weaned as tolerated. Did require IV amiodarone post op for Atrial fibrillation.  Creatinine 1.81 on admission but rose to 3.81 post op requiring HD. Nephrology followed closely. Pt ultimately refused HD moving forward, only to consider as a last resort.  Thought to be making progress but volume overloaded on day of discharge, but pt adamant she was going home so all arrangements made with close follow up. Pt was aware of risk of needed dialysis evenetually.    She presents today for regular follow up. Has been feeling good overall. Weight up 5 lbs from last visit. Breathing has been fine.  Saw nephrology a few months ago and creatinine was fine.  Taking torsemide 40 mg BID. Has not needed any extra torsemide  Denies any DOE. Walks around grocery store fine. No SOB on flat ground. Climbing stairs still causes mild SOB. Denies Orthopnea, PND, CP, lightheadedness or dizziness.   Right/Left Heart Cath and Coronary Angiography by [place] on 05/20/2016: Conclusion   Prox RCA lesion, 70 %stenosed.  Dist RCA lesion, 80 %stenosed.  Prox RCA to Mid RCA lesion, 0 %stenosed.  Mid Cx lesion, 0 %stenosed.  Dist LAD lesion, 90 %stenosed.  Mid LAD lesion, 95 %stenosed.  Prox Cx lesion, 30 %stenosed.  Mid RCA lesion, 90 %stenosed.  Hemodynamic findings  consistent with moderate pulmonary hypertension.  1. Severely elevated filling pressures with an LVEDP of 33 mmHg, moderate pulmonary hypertension with a mean pressure of 40 mmHg and mildly reduced cardiac output at 3.98 with a cardiac index of 2.26. These numbers were on milrinone infusion and multiple days of IV diuresis.  2. Significant three-vessel coronary artery disease with patent stent in the left circumflex. Significant progression of mid LAD disease over the last 10 months and diffuse obstructive disease affecting the right coronary artery in multiple areas   Past Medical History:  Diagnosis Date  . Anemia   . CAD (coronary artery disease)    a. s/p MI/syncope in 2009 s/p remote stenting; b. NSTEMI 06/2015, cardiac cath 06/2015 pLAD 70% FFR 0.88 not clinically sig, dLAD 90%, mLCx 80% s/p PCI/DES 0%, pRCA 70%, mRCA 80%, dRCA 80%; c. cath 05/2016 mLAD 95%, dLAD 90%, dLAD 90%, pRCA 70%, mRCA 90%, dRCA 80%  . Chronic systolic CHF (congestive heart failure) ([place])   . CKD (chronic kidney disease), stage III   . Diabetes mellitus without complication ([place])   . HLD (hyperlipidemia)   . HTN (hypertension)   . Ischemic cardiomyopathy    a. echo 06/2015: EF 30-35%, diffuse HK, nable to exclude RWMA, LV diastolic function parameters were normal, mild MR, left atrium was mildly dilated, PASP 28 mm Hg, IVC was dilated c/w elevated CVP; b. echo 05/2016: EF 20%, severe global HK with AK in ant, anteroseptal, apical wall, GR1DD, mild MR, mod dilated LA, RV sys fxn mod reduced, PASP 53 mmHg, triv pericardial effusion, L  pleural effusion  . PAF (paroxysmal atrial fibrillation) ([place])    a. new onset 06/2015; b. CHADS2VASc = 5 (CHF, HTN, age x 1, vascular dz, female); c. not on long term full dose anticoagulation in an effort to avoid triple therapy given her anemia     Current Outpatient Prescriptions  Medication Sig Dispense Refill  . amiodarone (PACERONE) 200 MG tablet TAKE 1 TABLET BY MOUTH DAILY 30  tablet 3  . aspirin EC 81 MG tablet Take 81 mg by mouth daily.    Marland Kitchen atorvastatin (LIPITOR) 40 MG tablet Take 1 tablet (40 mg total) by mouth daily at 6 PM. 30 tablet 0  . carvedilol (COREG) 6.25 MG tablet Take 1 tablet (6.25 mg total) by mouth 2 (two) times daily with a meal. 60 tablet 6  . ferrous sulfate 325 (65 FE) MG EC tablet Take 325 mg by mouth daily.    . insulin glargine (LANTUS) 100 UNIT/ML injection Inject 12 Units into the skin at bedtime.    . insulin lispro (HUMALOG) 100 UNIT/ML injection Inject 5-12 Units into the skin 3 (three) times daily before meals. Per sliding scale    . metolazone (ZAROXOLYN) 2.5 MG tablet TAKE 1 TABLET BY MOUTH AS NEEDED FOR WEIGHT GAIN OR SWELLING 30 tablet 0  . potassium chloride SA (K-DUR,KLOR-CON) 20 MEQ tablet Take 1 tablet (20 mEq total) by mouth 2 (two) times daily. (Patient taking differently: Take 20 mEq by mouth 3 (three) times daily. ) 60 tablet 3  . torsemide (DEMADEX) 20 MG tablet TAKE 2 TABLETS(40 MG) BY MOUTH TWICE DAILY 360 tablet 3  . warfarin (COUMADIN) 1 MG tablet Take as directed by Coumadin Clinic 45 tablet 3   No current facility-administered medications for this encounter.    Allergies  Allergen Reactions  . No Known Allergies    Social History   Social History  . Marital status: Divorced    Spouse name: N/A  . Number of children: N/A  . Years of education: N/A   Occupational History  . Not on file.   Social History Main Topics  . Smoking status: Never Smoker  . Smokeless tobacco: Never Used  . Alcohol use No  . Drug use: No  . Sexual activity: Not Currently   Other Topics Concern  . Not on file   Social History Narrative  . No narrative on file      Family History  Problem Relation Age of Onset  . CAD Mother   . Alzheimer's disease Mother   . Prostate cancer Father   . Diabetes Brother   . Kidney failure Brother     Vitals:   10/21/16 0950  BP: (!) 142/78  Pulse: 75  SpO2: 96%  Weight: 142 lb  12.8 oz (64.8 kg)   Wt Readings from Last 3 Encounters:  10/21/16 142 lb 12.8 oz (64.8 kg)  08/21/16 139 lb (63 kg)  08/13/16 137 lb 4 oz (62.3 kg)     PHYSICAL EXAM: General: Well appearing. No resp difficulty. HEENT: normal Neck: supple. JVD 7-8. Carotids 2+ bilat; no bruits. No thyromegaly or nodule noted. Cor: PMI nondisplaced. RRR, No M/G/R noted Lungs: CTAB, normal effort. Abdomen: soft, non-tender, distended, no HSM. No bruits or masses. +BS  Extremities: no cyanosis, clubbing, rash, R and LLE no edema.  Neuro: alert & orientedx3, cranial nerves grossly intact. moves all 4 extremities w/o difficulty. Affect pleasant   ASSESSMENT & PLAN:  1. Chronic systolic HF due to Ischemic cardiomyopathy, EF 20%  with mild LV dilation on 12/17 echo.  - Volume status looks stable on exam.  - Continue torsemide 40 mg BID.  Only taking metolazone 2.5 mg as needed.  - No ACE/ARB/ARNI with CDK IV. BMET today.  - Gently increase coreg to 6.25 mg BID.  - Reinforced fluid restriction to < 2 L daily, sodium restriction to less than 2000 mg daily, and the importance of daily weights.   2. NSTEMI with CAD-severe 2 vessel disease LAD RCA    - s/p CABG 12/18 with LIMA-LAD, SVG-RCA and SVG-diagonal - Denies chest pain. Sternotomy scar well healed.   - Continue statin. - She has rehabbed well on her own and is likely pass the point where she would receive any additional benefit from Cardiac rehab, Also, refuses.  3. CKD III-IV: - BMET today. Follows with renal.  4. Uncontrolled DM- Hgb AC 11.3  - Needs to follow up with PCP.  5. PAF: Worse after CABG, but had isolated incidents prior to.  - NSR on exam - Continue amiodarone 200 mg daily. Check LFTs and TSH today.  - Continue coumadin. Dosing per coumadin clinic. No bleeding.  6. HTN - Meds as above.   Coreg as above. CMET and TFTs today. Follow up 3-4 months with Echo on MD side.  Shirley Friar, PA-C 10/21/16   Greater than 50% of  the 25 minute visit was spent in counseling/coordination of care regarding disease state education, fluid/salt restriction, and medication reconciliation.

## 2016-10-22 LAB — T3, FREE: T3, Free: 2.5 pg/mL (ref 2.0–4.4)

## 2016-10-23 ENCOUNTER — Ambulatory Visit (INDEPENDENT_AMBULATORY_CARE_PROVIDER_SITE_OTHER): Payer: Medicare Other | Admitting: *Deleted

## 2016-10-23 DIAGNOSIS — I480 Paroxysmal atrial fibrillation: Secondary | ICD-10-CM | POA: Diagnosis not present

## 2016-10-23 DIAGNOSIS — I2510 Atherosclerotic heart disease of native coronary artery without angina pectoris: Secondary | ICD-10-CM

## 2016-10-23 DIAGNOSIS — Z5181 Encounter for therapeutic drug level monitoring: Secondary | ICD-10-CM | POA: Diagnosis not present

## 2016-10-23 LAB — POCT INR: INR: 2.6

## 2016-10-24 ENCOUNTER — Telehealth (HOSPITAL_COMMUNITY): Payer: Self-pay

## 2016-10-24 ENCOUNTER — Encounter (HOSPITAL_COMMUNITY): Payer: Self-pay

## 2016-10-24 NOTE — Telephone Encounter (Signed)
T4, free  Order: [id number]  Status:  Final result Visible to patient:  No (Not Released) Dx:  Chronic systolic CHF (congestive hear...  Notes recorded by Effie Berkshire, RN on 10/24/2016 at 10:07 AM EDT 4th attempt made to reach patient unsuccessful, letter mailed, forwarded to PCP ------  Notes recorded by Kerry Dory, CMA on 10/23/2016 at 12:30 PM EDT Left message for patient to call back.Y:[phone]  ------  Notes recorded by Harvie Junior, CMA on 10/23/2016 at 12:30 PM EDT Left VM for pt to call for lab results. ------  Notes recorded by Shirley Friar, PA-C on 10/22/2016 at 12:41 PM EDT Please follow up when able.   Thanks!  ------  Notes recorded by Kerry Dory, CMA on 10/21/2016 at 4:37 PM EDT Unable to reach patient. No answer [phone] (H)  ------  Notes recorded by Shirley Friar, PA-C on 10/21/2016 at 2:44 PM EDT Needs to see PCP ASAP for sugars and Hypothyroidism.   Please forward to Dr. Magdalen Spatz with [facility] in [place].   Thanks!   Legrand Como [address] [place], PA-C 10/21/2016 2:44 PM

## 2016-11-13 DIAGNOSIS — N184 Chronic kidney disease, stage 4 (severe): Secondary | ICD-10-CM | POA: Diagnosis not present

## 2016-11-13 DIAGNOSIS — D631 Anemia in chronic kidney disease: Secondary | ICD-10-CM | POA: Diagnosis not present

## 2016-11-13 DIAGNOSIS — N2581 Secondary hyperparathyroidism of renal origin: Secondary | ICD-10-CM | POA: Diagnosis not present

## 2016-11-13 DIAGNOSIS — R809 Proteinuria, unspecified: Secondary | ICD-10-CM | POA: Diagnosis not present

## 2016-11-13 DIAGNOSIS — E1122 Type 2 diabetes mellitus with diabetic chronic kidney disease: Secondary | ICD-10-CM | POA: Diagnosis not present

## 2016-11-13 DIAGNOSIS — I129 Hypertensive chronic kidney disease with stage 1 through stage 4 chronic kidney disease, or unspecified chronic kidney disease: Secondary | ICD-10-CM | POA: Diagnosis not present

## 2016-11-20 DIAGNOSIS — E1122 Type 2 diabetes mellitus with diabetic chronic kidney disease: Secondary | ICD-10-CM | POA: Diagnosis not present

## 2016-11-20 DIAGNOSIS — R809 Proteinuria, unspecified: Secondary | ICD-10-CM | POA: Diagnosis not present

## 2016-11-20 DIAGNOSIS — N183 Chronic kidney disease, stage 3 (moderate): Secondary | ICD-10-CM | POA: Diagnosis not present

## 2016-11-20 DIAGNOSIS — I129 Hypertensive chronic kidney disease with stage 1 through stage 4 chronic kidney disease, or unspecified chronic kidney disease: Secondary | ICD-10-CM | POA: Diagnosis not present

## 2016-11-20 DIAGNOSIS — I5020 Unspecified systolic (congestive) heart failure: Secondary | ICD-10-CM | POA: Diagnosis not present

## 2016-12-04 ENCOUNTER — Ambulatory Visit (INDEPENDENT_AMBULATORY_CARE_PROVIDER_SITE_OTHER): Payer: Medicare Other

## 2016-12-04 DIAGNOSIS — I480 Paroxysmal atrial fibrillation: Secondary | ICD-10-CM

## 2016-12-04 DIAGNOSIS — I2510 Atherosclerotic heart disease of native coronary artery without angina pectoris: Secondary | ICD-10-CM

## 2016-12-04 DIAGNOSIS — Z5181 Encounter for therapeutic drug level monitoring: Secondary | ICD-10-CM | POA: Diagnosis not present

## 2016-12-04 LAB — POCT INR: INR: 2.6

## 2017-01-15 ENCOUNTER — Ambulatory Visit (INDEPENDENT_AMBULATORY_CARE_PROVIDER_SITE_OTHER): Payer: Medicare Other

## 2017-01-15 DIAGNOSIS — Z5181 Encounter for therapeutic drug level monitoring: Secondary | ICD-10-CM | POA: Diagnosis not present

## 2017-01-15 DIAGNOSIS — I2510 Atherosclerotic heart disease of native coronary artery without angina pectoris: Secondary | ICD-10-CM

## 2017-01-15 DIAGNOSIS — I480 Paroxysmal atrial fibrillation: Secondary | ICD-10-CM | POA: Diagnosis not present

## 2017-01-15 LAB — POCT INR: INR: 1.3

## 2017-01-22 ENCOUNTER — Ambulatory Visit (HOSPITAL_COMMUNITY): Admission: RE | Admit: 2017-01-22 | Payer: Medicare Other | Source: Ambulatory Visit

## 2017-01-22 ENCOUNTER — Ambulatory Visit (INDEPENDENT_AMBULATORY_CARE_PROVIDER_SITE_OTHER): Payer: Medicare Other

## 2017-01-22 ENCOUNTER — Encounter (HOSPITAL_COMMUNITY): Payer: Medicare Other | Admitting: Internal Medicine

## 2017-01-22 ENCOUNTER — Other Ambulatory Visit (HOSPITAL_COMMUNITY): Payer: Self-pay | Admitting: *Deleted

## 2017-01-22 DIAGNOSIS — Z5181 Encounter for therapeutic drug level monitoring: Secondary | ICD-10-CM | POA: Diagnosis not present

## 2017-01-22 DIAGNOSIS — I480 Paroxysmal atrial fibrillation: Secondary | ICD-10-CM | POA: Diagnosis not present

## 2017-01-22 DIAGNOSIS — I5022 Chronic systolic (congestive) heart failure: Secondary | ICD-10-CM

## 2017-01-22 DIAGNOSIS — I2510 Atherosclerotic heart disease of native coronary artery without angina pectoris: Secondary | ICD-10-CM

## 2017-01-22 LAB — POCT INR: INR: 1.8

## 2017-02-12 ENCOUNTER — Ambulatory Visit (INDEPENDENT_AMBULATORY_CARE_PROVIDER_SITE_OTHER): Payer: Medicare Other

## 2017-02-12 ENCOUNTER — Other Ambulatory Visit: Payer: Self-pay

## 2017-02-12 DIAGNOSIS — I480 Paroxysmal atrial fibrillation: Secondary | ICD-10-CM | POA: Diagnosis not present

## 2017-02-12 DIAGNOSIS — Z5181 Encounter for therapeutic drug level monitoring: Secondary | ICD-10-CM

## 2017-02-12 LAB — POCT INR: INR: 1.6

## 2017-02-12 MED ORDER — WARFARIN SODIUM 1 MG PO TABS: ORAL_TABLET | ORAL | 3 refills | Status: DC

## 2017-02-13 ENCOUNTER — Other Ambulatory Visit: Payer: Self-pay | Admitting: Internal Medicine

## 2017-02-26 ENCOUNTER — Ambulatory Visit (INDEPENDENT_AMBULATORY_CARE_PROVIDER_SITE_OTHER): Payer: Medicare Other

## 2017-02-26 DIAGNOSIS — Z5181 Encounter for therapeutic drug level monitoring: Secondary | ICD-10-CM

## 2017-02-26 DIAGNOSIS — I480 Paroxysmal atrial fibrillation: Secondary | ICD-10-CM | POA: Diagnosis not present

## 2017-02-26 LAB — POCT INR: INR: 1.8

## 2017-02-27 DIAGNOSIS — N184 Chronic kidney disease, stage 4 (severe): Secondary | ICD-10-CM | POA: Diagnosis not present

## 2017-02-27 DIAGNOSIS — R809 Proteinuria, unspecified: Secondary | ICD-10-CM | POA: Diagnosis not present

## 2017-02-27 DIAGNOSIS — E1122 Type 2 diabetes mellitus with diabetic chronic kidney disease: Secondary | ICD-10-CM | POA: Diagnosis not present

## 2017-02-27 DIAGNOSIS — N2581 Secondary hyperparathyroidism of renal origin: Secondary | ICD-10-CM | POA: Diagnosis not present

## 2017-02-27 DIAGNOSIS — I129 Hypertensive chronic kidney disease with stage 1 through stage 4 chronic kidney disease, or unspecified chronic kidney disease: Secondary | ICD-10-CM | POA: Diagnosis not present

## 2017-03-12 ENCOUNTER — Ambulatory Visit (INDEPENDENT_AMBULATORY_CARE_PROVIDER_SITE_OTHER): Payer: Medicare Other

## 2017-03-12 DIAGNOSIS — Z5181 Encounter for therapeutic drug level monitoring: Secondary | ICD-10-CM

## 2017-03-12 DIAGNOSIS — I480 Paroxysmal atrial fibrillation: Secondary | ICD-10-CM | POA: Diagnosis not present

## 2017-03-12 LAB — POCT INR: INR: 1.1

## 2017-03-13 DIAGNOSIS — I517 Cardiomegaly: Secondary | ICD-10-CM | POA: Diagnosis not present

## 2017-03-13 DIAGNOSIS — I480 Paroxysmal atrial fibrillation: Secondary | ICD-10-CM | POA: Diagnosis not present

## 2017-03-13 DIAGNOSIS — R42 Dizziness and giddiness: Secondary | ICD-10-CM | POA: Diagnosis not present

## 2017-03-13 DIAGNOSIS — I130 Hypertensive heart and chronic kidney disease with heart failure and stage 1 through stage 4 chronic kidney disease, or unspecified chronic kidney disease: Secondary | ICD-10-CM | POA: Diagnosis not present

## 2017-03-13 DIAGNOSIS — R9431 Abnormal electrocardiogram [ECG] [EKG]: Secondary | ICD-10-CM | POA: Diagnosis not present

## 2017-03-13 DIAGNOSIS — I5022 Chronic systolic (congestive) heart failure: Secondary | ICD-10-CM | POA: Diagnosis not present

## 2017-03-13 DIAGNOSIS — N179 Acute kidney failure, unspecified: Secondary | ICD-10-CM | POA: Diagnosis not present

## 2017-03-13 DIAGNOSIS — J9811 Atelectasis: Secondary | ICD-10-CM | POA: Diagnosis not present

## 2017-03-13 DIAGNOSIS — E1022 Type 1 diabetes mellitus with diabetic chronic kidney disease: Secondary | ICD-10-CM | POA: Diagnosis not present

## 2017-03-13 DIAGNOSIS — E871 Hypo-osmolality and hyponatremia: Secondary | ICD-10-CM | POA: Diagnosis not present

## 2017-03-13 DIAGNOSIS — I951 Orthostatic hypotension: Secondary | ICD-10-CM | POA: Diagnosis not present

## 2017-03-13 DIAGNOSIS — E1165 Type 2 diabetes mellitus with hyperglycemia: Secondary | ICD-10-CM | POA: Diagnosis not present

## 2017-03-14 DIAGNOSIS — E785 Hyperlipidemia, unspecified: Secondary | ICD-10-CM | POA: Diagnosis present

## 2017-03-14 DIAGNOSIS — N183 Chronic kidney disease, stage 3 (moderate): Secondary | ICD-10-CM | POA: Diagnosis not present

## 2017-03-14 DIAGNOSIS — D509 Iron deficiency anemia, unspecified: Secondary | ICD-10-CM | POA: Diagnosis present

## 2017-03-14 DIAGNOSIS — I130 Hypertensive heart and chronic kidney disease with heart failure and stage 1 through stage 4 chronic kidney disease, or unspecified chronic kidney disease: Secondary | ICD-10-CM | POA: Diagnosis present

## 2017-03-14 DIAGNOSIS — Z794 Long term (current) use of insulin: Secondary | ICD-10-CM | POA: Diagnosis not present

## 2017-03-14 DIAGNOSIS — E1022 Type 1 diabetes mellitus with diabetic chronic kidney disease: Secondary | ICD-10-CM | POA: Diagnosis present

## 2017-03-14 DIAGNOSIS — E871 Hypo-osmolality and hyponatremia: Secondary | ICD-10-CM | POA: Diagnosis not present

## 2017-03-14 DIAGNOSIS — I480 Paroxysmal atrial fibrillation: Secondary | ICD-10-CM | POA: Diagnosis not present

## 2017-03-14 DIAGNOSIS — I5022 Chronic systolic (congestive) heart failure: Secondary | ICD-10-CM | POA: Diagnosis not present

## 2017-03-14 DIAGNOSIS — E1065 Type 1 diabetes mellitus with hyperglycemia: Secondary | ICD-10-CM | POA: Diagnosis not present

## 2017-03-14 DIAGNOSIS — Z79899 Other long term (current) drug therapy: Secondary | ICD-10-CM | POA: Diagnosis not present

## 2017-03-14 DIAGNOSIS — R791 Abnormal coagulation profile: Secondary | ICD-10-CM | POA: Diagnosis not present

## 2017-03-14 DIAGNOSIS — Z7901 Long term (current) use of anticoagulants: Secondary | ICD-10-CM | POA: Diagnosis not present

## 2017-03-14 DIAGNOSIS — N179 Acute kidney failure, unspecified: Secondary | ICD-10-CM | POA: Diagnosis not present

## 2017-03-14 DIAGNOSIS — Z951 Presence of aortocoronary bypass graft: Secondary | ICD-10-CM | POA: Diagnosis not present

## 2017-03-14 DIAGNOSIS — I2510 Atherosclerotic heart disease of native coronary artery without angina pectoris: Secondary | ICD-10-CM | POA: Diagnosis present

## 2017-03-14 DIAGNOSIS — I951 Orthostatic hypotension: Secondary | ICD-10-CM | POA: Diagnosis not present

## 2017-03-14 DIAGNOSIS — E860 Dehydration: Secondary | ICD-10-CM | POA: Diagnosis present

## 2017-03-14 DIAGNOSIS — I255 Ischemic cardiomyopathy: Secondary | ICD-10-CM | POA: Diagnosis present

## 2017-03-14 DIAGNOSIS — Z66 Do not resuscitate: Secondary | ICD-10-CM | POA: Diagnosis not present

## 2017-03-14 DIAGNOSIS — Z7982 Long term (current) use of aspirin: Secondary | ICD-10-CM | POA: Diagnosis not present

## 2017-03-25 ENCOUNTER — Ambulatory Visit (HOSPITAL_COMMUNITY): Admission: RE | Admit: 2017-03-25 | Payer: Medicare Other | Source: Ambulatory Visit

## 2017-03-25 ENCOUNTER — Encounter (HOSPITAL_COMMUNITY): Payer: Self-pay | Admitting: Internal Medicine

## 2017-03-25 ENCOUNTER — Ambulatory Visit (HOSPITAL_COMMUNITY)
Admission: RE | Admit: 2017-03-25 | Discharge: 2017-03-25 | Disposition: A | Discharge status: Home / Self Care | Payer: Medicare Other | Source: Ambulatory Visit | Attending: Internal Medicine | Admitting: Internal Medicine

## 2017-03-25 VITALS — BP 154/74 | HR 89 | Wt 141.5 lb

## 2017-03-25 DIAGNOSIS — Z8249 Family history of ischemic heart disease and other diseases of the circulatory system: Secondary | ICD-10-CM | POA: Diagnosis not present

## 2017-03-25 DIAGNOSIS — Z794 Long term (current) use of insulin: Secondary | ICD-10-CM | POA: Insufficient documentation

## 2017-03-25 DIAGNOSIS — I214 Non-ST elevation (NSTEMI) myocardial infarction: Secondary | ICD-10-CM | POA: Diagnosis not present

## 2017-03-25 DIAGNOSIS — E785 Hyperlipidemia, unspecified: Secondary | ICD-10-CM | POA: Insufficient documentation

## 2017-03-25 DIAGNOSIS — Z7901 Long term (current) use of anticoagulants: Secondary | ICD-10-CM | POA: Diagnosis not present

## 2017-03-25 DIAGNOSIS — I4891 Unspecified atrial fibrillation: Secondary | ICD-10-CM

## 2017-03-25 DIAGNOSIS — Z833 Family history of diabetes mellitus: Secondary | ICD-10-CM | POA: Diagnosis not present

## 2017-03-25 DIAGNOSIS — Z8042 Family history of malignant neoplasm of prostate: Secondary | ICD-10-CM | POA: Insufficient documentation

## 2017-03-25 DIAGNOSIS — Z79899 Other long term (current) drug therapy: Secondary | ICD-10-CM | POA: Diagnosis not present

## 2017-03-25 DIAGNOSIS — Z7982 Long term (current) use of aspirin: Secondary | ICD-10-CM | POA: Insufficient documentation

## 2017-03-25 DIAGNOSIS — I255 Ischemic cardiomyopathy: Secondary | ICD-10-CM | POA: Insufficient documentation

## 2017-03-25 DIAGNOSIS — I480 Paroxysmal atrial fibrillation: Secondary | ICD-10-CM | POA: Diagnosis not present

## 2017-03-25 DIAGNOSIS — I2510 Atherosclerotic heart disease of native coronary artery without angina pectoris: Secondary | ICD-10-CM | POA: Diagnosis not present

## 2017-03-25 DIAGNOSIS — N183 Chronic kidney disease, stage 3 unspecified: Secondary | ICD-10-CM

## 2017-03-25 DIAGNOSIS — I10 Essential (primary) hypertension: Secondary | ICD-10-CM | POA: Diagnosis not present

## 2017-03-25 DIAGNOSIS — E1122 Type 2 diabetes mellitus with diabetic chronic kidney disease: Secondary | ICD-10-CM | POA: Insufficient documentation

## 2017-03-25 DIAGNOSIS — I130 Hypertensive heart and chronic kidney disease with heart failure and stage 1 through stage 4 chronic kidney disease, or unspecified chronic kidney disease: Secondary | ICD-10-CM | POA: Insufficient documentation

## 2017-03-25 DIAGNOSIS — N184 Chronic kidney disease, stage 4 (severe): Secondary | ICD-10-CM | POA: Diagnosis not present

## 2017-03-25 DIAGNOSIS — Z955 Presence of coronary angioplasty implant and graft: Secondary | ICD-10-CM | POA: Insufficient documentation

## 2017-03-25 DIAGNOSIS — I5022 Chronic systolic (congestive) heart failure: Secondary | ICD-10-CM | POA: Diagnosis not present

## 2017-03-25 LAB — BASIC METABOLIC PANEL
ANION GAP: 10 (ref 5–15)
BUN: 50 mg/dL — ABNORMAL HIGH (ref 6–20)
CALCIUM: 9.6 mg/dL (ref 8.9–10.3)
CO2: 30 mmol/L (ref 22–32)
Chloride: 95 mmol/L — ABNORMAL LOW (ref 101–111)
Creatinine, Ser: 2.31 mg/dL — ABNORMAL HIGH (ref 0.44–1.00)
GFR, EST AFRICAN AMERICAN: 24 mL/min — AB (ref >60)
GFR, EST NON AFRICAN AMERICAN: 21 mL/min — AB (ref >60)
Glucose, Bld: 234 mg/dL — ABNORMAL HIGH (ref 65–99)
Potassium: 4.2 mmol/L (ref 3.5–5.1)
Sodium: 135 mmol/L (ref 135–145)

## 2017-03-25 MED ORDER — CARVEDILOL 3.125 MG PO TABS: 3.1250 mg | ORAL_TABLET | Freq: Two times a day (BID) | ORAL | 3 refills | Status: DC

## 2017-03-25 NOTE — Patient Instructions (Signed)
Start Carvedilol 3.125 mg Twice daily   Lab today  We will contact you in 4-6 months to schedule your next appointment.

## 2017-03-25 NOTE — Progress Notes (Signed)
Advanced Heart Failure Clinic Note   PCP: Dr. Magdalen Spatz [facility] Primary) Primary Cardiologist: Dr. Fletcher Anon Primary HF: Dr. Haroldine Laws   HPI:  Theresa Rogers is a 66 y.o. female with CAD s/p CABG x 3 (left internal mammary artery to left anterior descending, saphenous vein graft to diagonal,saphenous vein graft to distal right coronary artery), ICM/chronic systolic CHF, PAF (diagnosed 06/2015), DM2, CKD stage III, HTN, HLD, and anemia.  Transferred from [facility] 05/20/16 with failure to diurese with IV lasix and milrinone. L/RHC as below.  Dr Prescott Gum consulted for CABG consideration. Pt underwent CABG x 3 on 05/27/16 after medical optimization. Pressors weaned as tolerated. Did require IV amiodarone post op for Atrial fibrillation.  Creatinine 1.81 on admission but rose to 3.81 post op requiring HD. Nephrology followed closely. Pt ultimately refused HD moving forward, only to consider as a last resort.  Thought to be making progress but volume overloaded on day of discharge, but pt adamant she was going home so all arrangements made with close follow up. Pt was aware of risk of needed dialysis evenetually.    Admitted to [facility] 10/4 -> 10/6 with orthostasis. Thought to be 2/2 dehydration. Diuretics, ARB, and BB stopped.   She presents today for regular follow up, recently in [facility] as above. She was instructed to stop torsemide, but has been taking 40 mg daily (was on 40 mg BID PTA). Weight at home 138-141 at home. Breathing is OK. Denies SOB walking grocery store or on flat ground. Mild SOB up steps (lives in 2nd floor apartment.) Denies lightheadedness or dizziness since discharge. Denies orthopnea or PND.   Echo 03/15/17 at [facility] - LVEF 40%, GRade 1 DD, mild LAE, "degenerative MV disease", Mitral annular calcification, Normal RV  Right/Left Heart Cath and Coronary Angiography by [place] on 05/20/2016: Conclusion   Prox RCA lesion, 70 %stenosed.  Dist RCA lesion, 80 %stenosed.  Prox RCA to  Mid RCA lesion, 0 %stenosed.  Mid Cx lesion, 0 %stenosed.  Dist LAD lesion, 90 %stenosed.  Mid LAD lesion, 95 %stenosed.  Prox Cx lesion, 30 %stenosed.  Mid RCA lesion, 90 %stenosed.  Hemodynamic findings consistent with moderate pulmonary hypertension.  1. Severely elevated filling pressures with an LVEDP of 33 mmHg, moderate pulmonary hypertension with a mean pressure of 40 mmHg and mildly reduced cardiac output at 3.98 with a cardiac index of 2.26. These numbers were on milrinone infusion and multiple days of IV diuresis.  2. Significant three-vessel coronary artery disease with patent stent in the left circumflex. Significant progression of mid LAD disease over the last 10 months and diffuse obstructive disease affecting the right coronary artery in multiple areas   Past Medical History:  Diagnosis Date  . Anemia   . CAD (coronary artery disease)    a. s/p MI/syncope in 2009 s/p remote stenting; b. NSTEMI 06/2015, cardiac cath 06/2015 pLAD 70% FFR 0.88 not clinically sig, dLAD 90%, mLCx 80% s/p PCI/DES 0%, pRCA 70%, mRCA 80%, dRCA 80%; c. cath 05/2016 mLAD 95%, dLAD 90%, dLAD 90%, pRCA 70%, mRCA 90%, dRCA 80%  . Chronic systolic CHF (congestive heart failure) ([place])   . CKD (chronic kidney disease), stage III ([place])   . Diabetes mellitus without complication ([place])   . HLD (hyperlipidemia)   . HTN (hypertension)   . Ischemic cardiomyopathy    a. echo 06/2015: EF 30-35%, diffuse HK, nable to exclude RWMA, LV diastolic function parameters were normal, mild MR, left atrium was mildly dilated, PASP 28 mm Hg, IVC  was dilated c/w elevated CVP; b. echo 05/2016: EF 20%, severe global HK with AK in ant, anteroseptal, apical wall, GR1DD, mild MR, mod dilated LA, RV sys fxn mod reduced, PASP 53 mmHg, triv pericardial effusion, L pleural effusion  . PAF (paroxysmal atrial fibrillation) ([place])    a. new onset 06/2015; b. CHADS2VASc = 5 (CHF, HTN, age x 1, vascular dz, female); c. not on long term  full dose anticoagulation in an effort to avoid triple therapy given her anemia     Current Outpatient Prescriptions  Medication Sig Dispense Refill  . amiodarone (PACERONE) 200 MG tablet TAKE 1 TABLET BY MOUTH DAILY 30 tablet 3  . aspirin EC 81 MG tablet Take 81 mg by mouth daily.    Marland Kitchen atorvastatin (LIPITOR) 40 MG tablet Take 1 tablet (40 mg total) by mouth daily at 6 PM. 30 tablet 0  . ferrous sulfate 325 (65 FE) MG EC tablet Take 325 mg by mouth daily.    . hydrALAZINE (APRESOLINE) 25 MG tablet Take 25 mg by mouth every 12 (twelve) hours.    . insulin glargine (LANTUS) 100 UNIT/ML injection Inject 12 Units into the skin at bedtime.    . insulin lispro (HUMALOG) 100 UNIT/ML injection Inject 5-12 Units into the skin 3 (three) times daily before meals. Per sliding scale    . torsemide (DEMADEX) 20 MG tablet Take 40 mg by mouth daily.    Marland Kitchen warfarin (COUMADIN) 1 MG tablet Take as directed by Coumadin Clinic 45 tablet 3   No current facility-administered medications for this encounter.    Allergies  Allergen Reactions  . No Known Allergies    Social History   Social History  . Marital status: Divorced    Spouse name: N/A  . Number of children: N/A  . Years of education: N/A   Occupational History  . Not on file.   Social History Main Topics  . Smoking status: Never Smoker  . Smokeless tobacco: Never Used  . Alcohol use No  . Drug use: No  . Sexual activity: Not Currently   Other Topics Concern  . Not on file   Social History Narrative  . No narrative on file      Family History  Problem Relation Age of Onset  . CAD Mother   . Alzheimer's disease Mother   . Prostate cancer Father   . Diabetes Brother   . Kidney failure Brother     Vitals:   03/25/17 1110  BP: (!) 154/74  Pulse: 89  SpO2: 99%  Weight: 141 lb 8 oz (64.2 kg)   Wt Readings from Last 3 Encounters:  03/25/17 141 lb 8 oz (64.2 kg)  10/21/16 142 lb 12.8 oz (64.8 kg)  08/21/16 139 lb (63 kg)      PHYSICAL EXAM: General: Well appearing. No resp difficulty. HEENT: Normal except for R subconjunctival hemorrhage (atraumatic) Neck: Supple. JVP 6-7 cm. Carotids 2+ bilat; no bruits. No thyromegaly or nodule noted. Cor: PMI nondisplaced. RRR, No M/G/R noted Lungs: CTAB, normal effort. Abdomen: Soft, non-tender, non-distended, no HSM. No bruits or masses. +BS  Extremities: No cyanosis, clubbing, rash, R and LLE no edema.  Neuro: Alert & orientedx3, cranial nerves grossly intact. moves all 4 extremities w/o difficulty. Affect pleasant   ASSESSMENT & PLAN:  1. Chronic systolic HF due to Ischemic cardiomyopathy - Echo 03/15/17 at [facility]  LVEF 40%, GRade 1 DD, mild LAE, "degenerative MV disease", Mitral annular calcification, Normal RV (Improved from EF 20% 05/2016) -  Volume status looks OK on exam - Would continue torsemide 40 mg daily for now. BMET today.  - Resume coreg 3.125 mg daily - No ACE/ARB/ARNI with CDK IV.  - Reinforced fluid restriction to < 2 L daily, sodium restriction to less than 2000 mg daily, and the importance of daily weights.   2. NSTEMI with CAD-severe 2 vessel disease LAD RCA    - s/p CABG 12/18 with LIMA-LAD, SVG-RCA and SVG-diagonal - No s/s of ischemia. Sternotomy scar well healed.   - Continue statin. - Has refused cardiac rehab.  3. CKD III-IV: - Creatinine 3.17 03/15/17 at [facility].  - BMET today.  4. Uncontrolled DM- Hgb AC 11.3  - Poorly controlled. Needs to follow with PCP (next week) - Consider jardiance 5. PAF: Post-op - NSR on exam.  - Continue amiodarone 200 mg daily. LFTs and TSH stable 10/2016.  - Continue coumadin. Sub therapeutic two weeks ago. Has appt in [place] tomorrow.  6. HTN - Meds as above.  - Continue hydralazine 25 mg BID.   Shirley Friar, PA-C 03/25/17   Patient seen and examined with the above-signed Advanced Practice Provider and/or Housestaff. I personally reviewed laboratory data, imaging studies and relevant notes. I  independently examined the patient and formulated the important aspects of the plan. I have edited the note to reflect any of my changes or salient points. I have personally discussed the plan with the patient and/or family.  Doing well. No s/s ischemia. Volume status well controlled. Maintaining NSR. EF improved to 40% on recent echo at [facility]. Meds recently cut back due to overdiuresis. Back on torsemide 40 daily. Wil continue. Restart low dose carvedilol 3.125 bid. Check labs today. Can stop amio. Resume statin.  Glori Bickers, MD  5:12 PM

## 2017-03-26 ENCOUNTER — Ambulatory Visit (INDEPENDENT_AMBULATORY_CARE_PROVIDER_SITE_OTHER): Payer: Medicare Other

## 2017-03-26 DIAGNOSIS — I4891 Unspecified atrial fibrillation: Secondary | ICD-10-CM

## 2017-03-26 DIAGNOSIS — I480 Paroxysmal atrial fibrillation: Secondary | ICD-10-CM | POA: Diagnosis not present

## 2017-03-26 DIAGNOSIS — Z5181 Encounter for therapeutic drug level monitoring: Secondary | ICD-10-CM

## 2017-03-26 LAB — POCT INR: INR: 1.7

## 2017-04-03 DIAGNOSIS — I255 Ischemic cardiomyopathy: Secondary | ICD-10-CM | POA: Diagnosis not present

## 2017-04-03 DIAGNOSIS — I2510 Atherosclerotic heart disease of native coronary artery without angina pectoris: Secondary | ICD-10-CM | POA: Diagnosis not present

## 2017-04-03 DIAGNOSIS — Z951 Presence of aortocoronary bypass graft: Secondary | ICD-10-CM | POA: Diagnosis not present

## 2017-04-03 DIAGNOSIS — E1022 Type 1 diabetes mellitus with diabetic chronic kidney disease: Secondary | ICD-10-CM | POA: Diagnosis not present

## 2017-04-03 DIAGNOSIS — I10 Essential (primary) hypertension: Secondary | ICD-10-CM | POA: Diagnosis not present

## 2017-04-03 DIAGNOSIS — N184 Chronic kidney disease, stage 4 (severe): Secondary | ICD-10-CM | POA: Diagnosis not present

## 2017-04-03 DIAGNOSIS — E785 Hyperlipidemia, unspecified: Secondary | ICD-10-CM | POA: Diagnosis not present

## 2017-04-10 DIAGNOSIS — I480 Paroxysmal atrial fibrillation: Secondary | ICD-10-CM | POA: Diagnosis not present

## 2017-04-24 DIAGNOSIS — I480 Paroxysmal atrial fibrillation: Secondary | ICD-10-CM | POA: Diagnosis not present

## 2017-05-08 DIAGNOSIS — N184 Chronic kidney disease, stage 4 (severe): Secondary | ICD-10-CM | POA: Diagnosis not present

## 2017-05-08 DIAGNOSIS — Z23 Encounter for immunization: Secondary | ICD-10-CM | POA: Diagnosis not present

## 2017-05-08 DIAGNOSIS — E1022 Type 1 diabetes mellitus with diabetic chronic kidney disease: Secondary | ICD-10-CM | POA: Diagnosis not present

## 2017-05-08 DIAGNOSIS — I10 Essential (primary) hypertension: Secondary | ICD-10-CM | POA: Diagnosis not present

## 2017-05-08 DIAGNOSIS — I480 Paroxysmal atrial fibrillation: Secondary | ICD-10-CM | POA: Diagnosis not present

## 2017-05-08 DIAGNOSIS — Z1211 Encounter for screening for malignant neoplasm of colon: Secondary | ICD-10-CM | POA: Diagnosis not present

## 2017-05-22 DIAGNOSIS — I480 Paroxysmal atrial fibrillation: Secondary | ICD-10-CM | POA: Diagnosis not present

## 2017-06-11 DIAGNOSIS — N184 Chronic kidney disease, stage 4 (severe): Secondary | ICD-10-CM | POA: Diagnosis not present

## 2017-06-11 DIAGNOSIS — R809 Proteinuria, unspecified: Secondary | ICD-10-CM | POA: Diagnosis not present

## 2017-06-11 DIAGNOSIS — E875 Hyperkalemia: Secondary | ICD-10-CM | POA: Diagnosis not present

## 2017-06-11 DIAGNOSIS — N2581 Secondary hyperparathyroidism of renal origin: Secondary | ICD-10-CM | POA: Diagnosis not present

## 2017-06-11 DIAGNOSIS — E1122 Type 2 diabetes mellitus with diabetic chronic kidney disease: Secondary | ICD-10-CM | POA: Diagnosis not present

## 2017-06-11 DIAGNOSIS — I129 Hypertensive chronic kidney disease with stage 1 through stage 4 chronic kidney disease, or unspecified chronic kidney disease: Secondary | ICD-10-CM | POA: Diagnosis not present

## 2017-06-19 DIAGNOSIS — I480 Paroxysmal atrial fibrillation: Secondary | ICD-10-CM | POA: Diagnosis not present

## 2017-06-30 IMAGING — XA IR FLUORO GUIDE CV LINE*R*
1 series · 2 of 2 positions shown · non-contrast
Comparison: none

INDICATION: 65-year-old female with a history of renal failure

[Series 1: fl (-) angio · 2 of 2 slices shown]
[im 1/2]
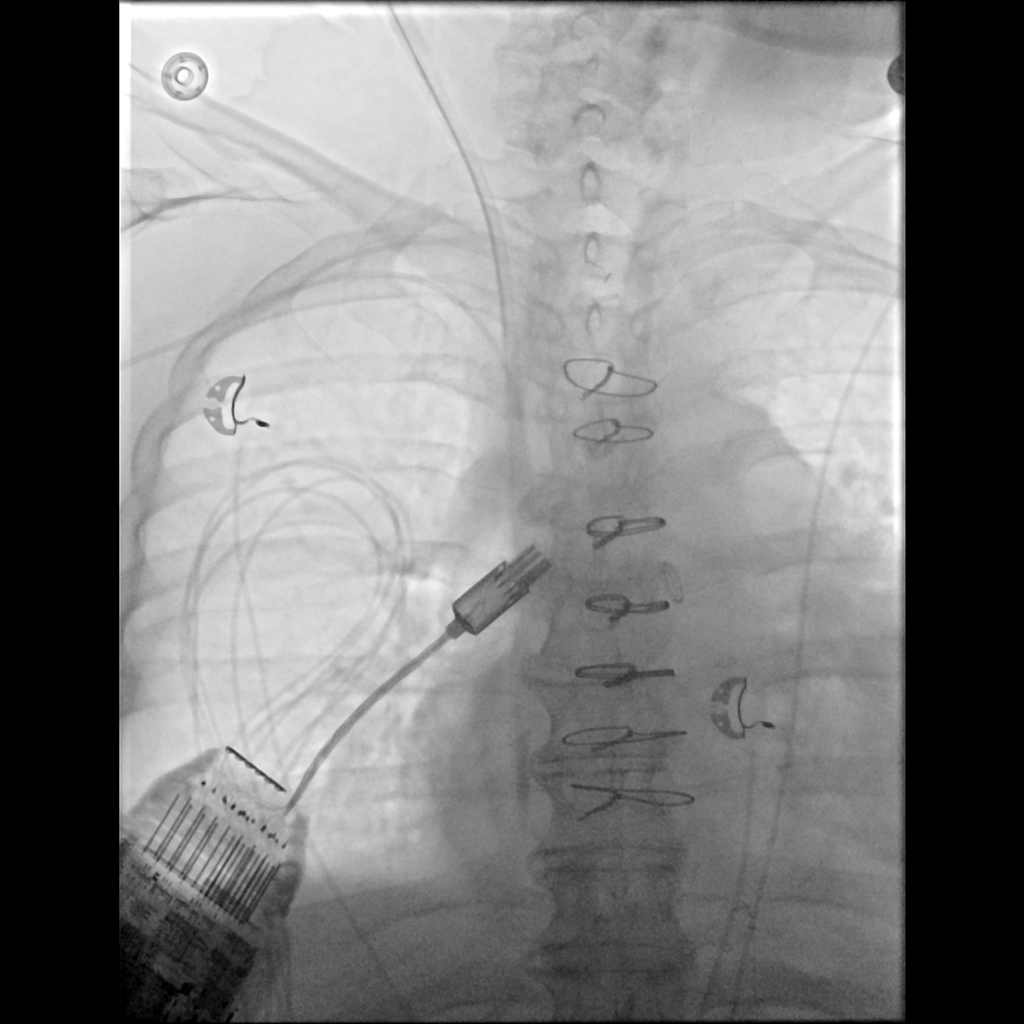
[im 2/2]
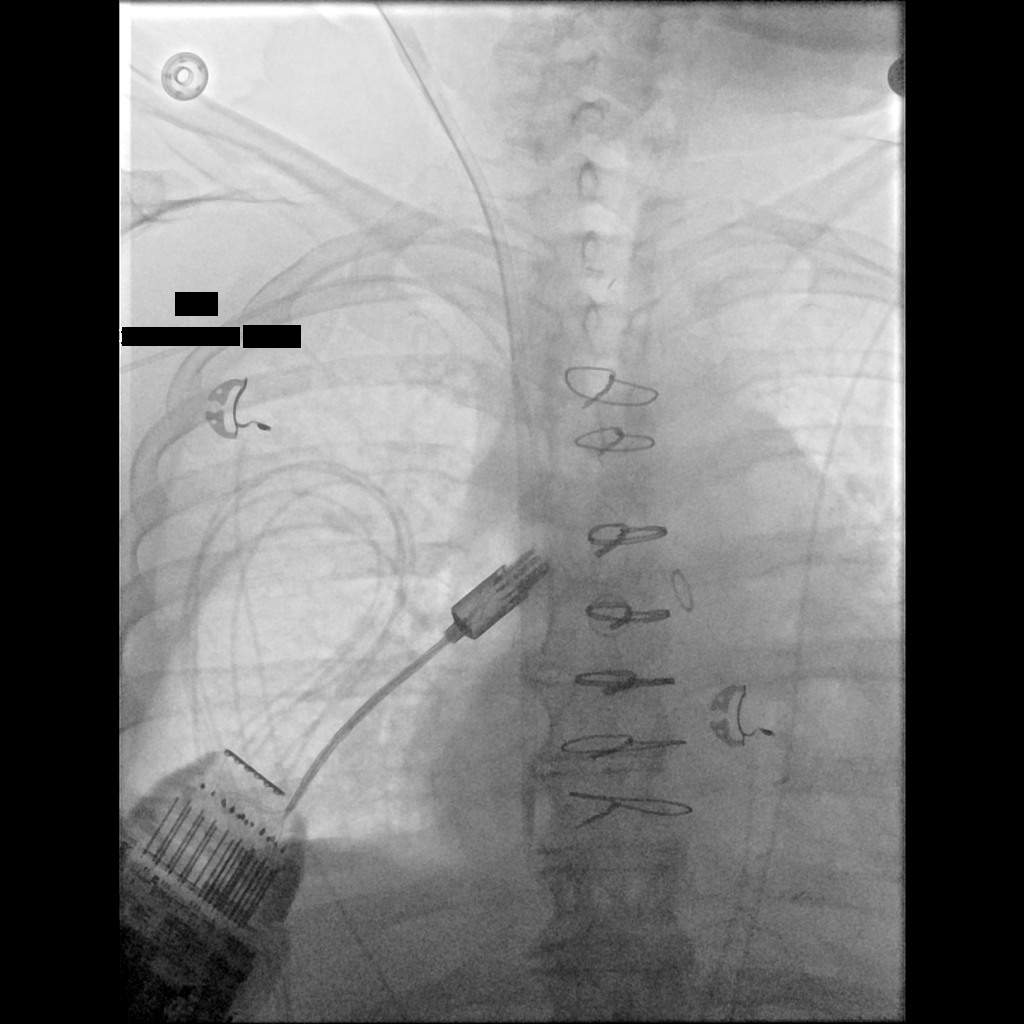

[2 of 2 positions shown; findings below may reference images not displayed]

EXAM:
TUNNELED CENTRAL VENOUS HEMODIALYSIS CATHETER PLACEMENT WITH
ULTRASOUND AND FLUOROSCOPIC GUIDANCE

MEDICATIONS:
None

ANESTHESIA/SEDATION:
None

FLUOROSCOPY TIME:  Fluoroscopy Time: 0 minutes 6 seconds (0.4 mGy).

COMPLICATIONS:
None

PROCEDURE:
Informed written consent was obtained from the patient's family
after a discussion of the risks, benefits, and alternatives to
treatment. Questions regarding the procedure were encouraged and
answered. The right neck was prepped with chlorhexidine in a sterile
fashion, and a sterile drape was applied covering the operative
field. Maximum barrier sterile technique with sterile gowns and
gloves were used for the procedure. A timeout was performed prior to
the initiation of the procedure.

A single wall needle was used access the right internal jugular vein
under direct, real-time ultrasound guidance after the overlying soft
tissues were anesthetized with 1% lidocaine with epinephrine.
Ultrasound image documentation was performed. Wire was advanced to
the level of the IVC. A 19 cm hemodialysis catheter was then placed
over the wire after serial dilation of the soft tissues.

Final catheter positioning was confirmed and documented with a spot
radiographic image. The catheter aspirates and flushes normally. The
catheter was flushed with appropriate volume heparin dwells.

Dressings were applied. The patient tolerated the procedure well
without immediate post procedural complication.
IMPRESSION: Status post temporary hemodialysis catheter at the right IJ.
Catheter can be easily converted if need be.

## 2017-07-07 IMAGING — DX DG CHEST 2V
2 series · 2 of 2 positions shown · non-contrast
Comparison: PA chest x-ray dated June 04, 2016

CLINICAL DATA: Status post CABG on May 27, 2016

EXAM:
CHEST  2 VIEW

[chest pa]
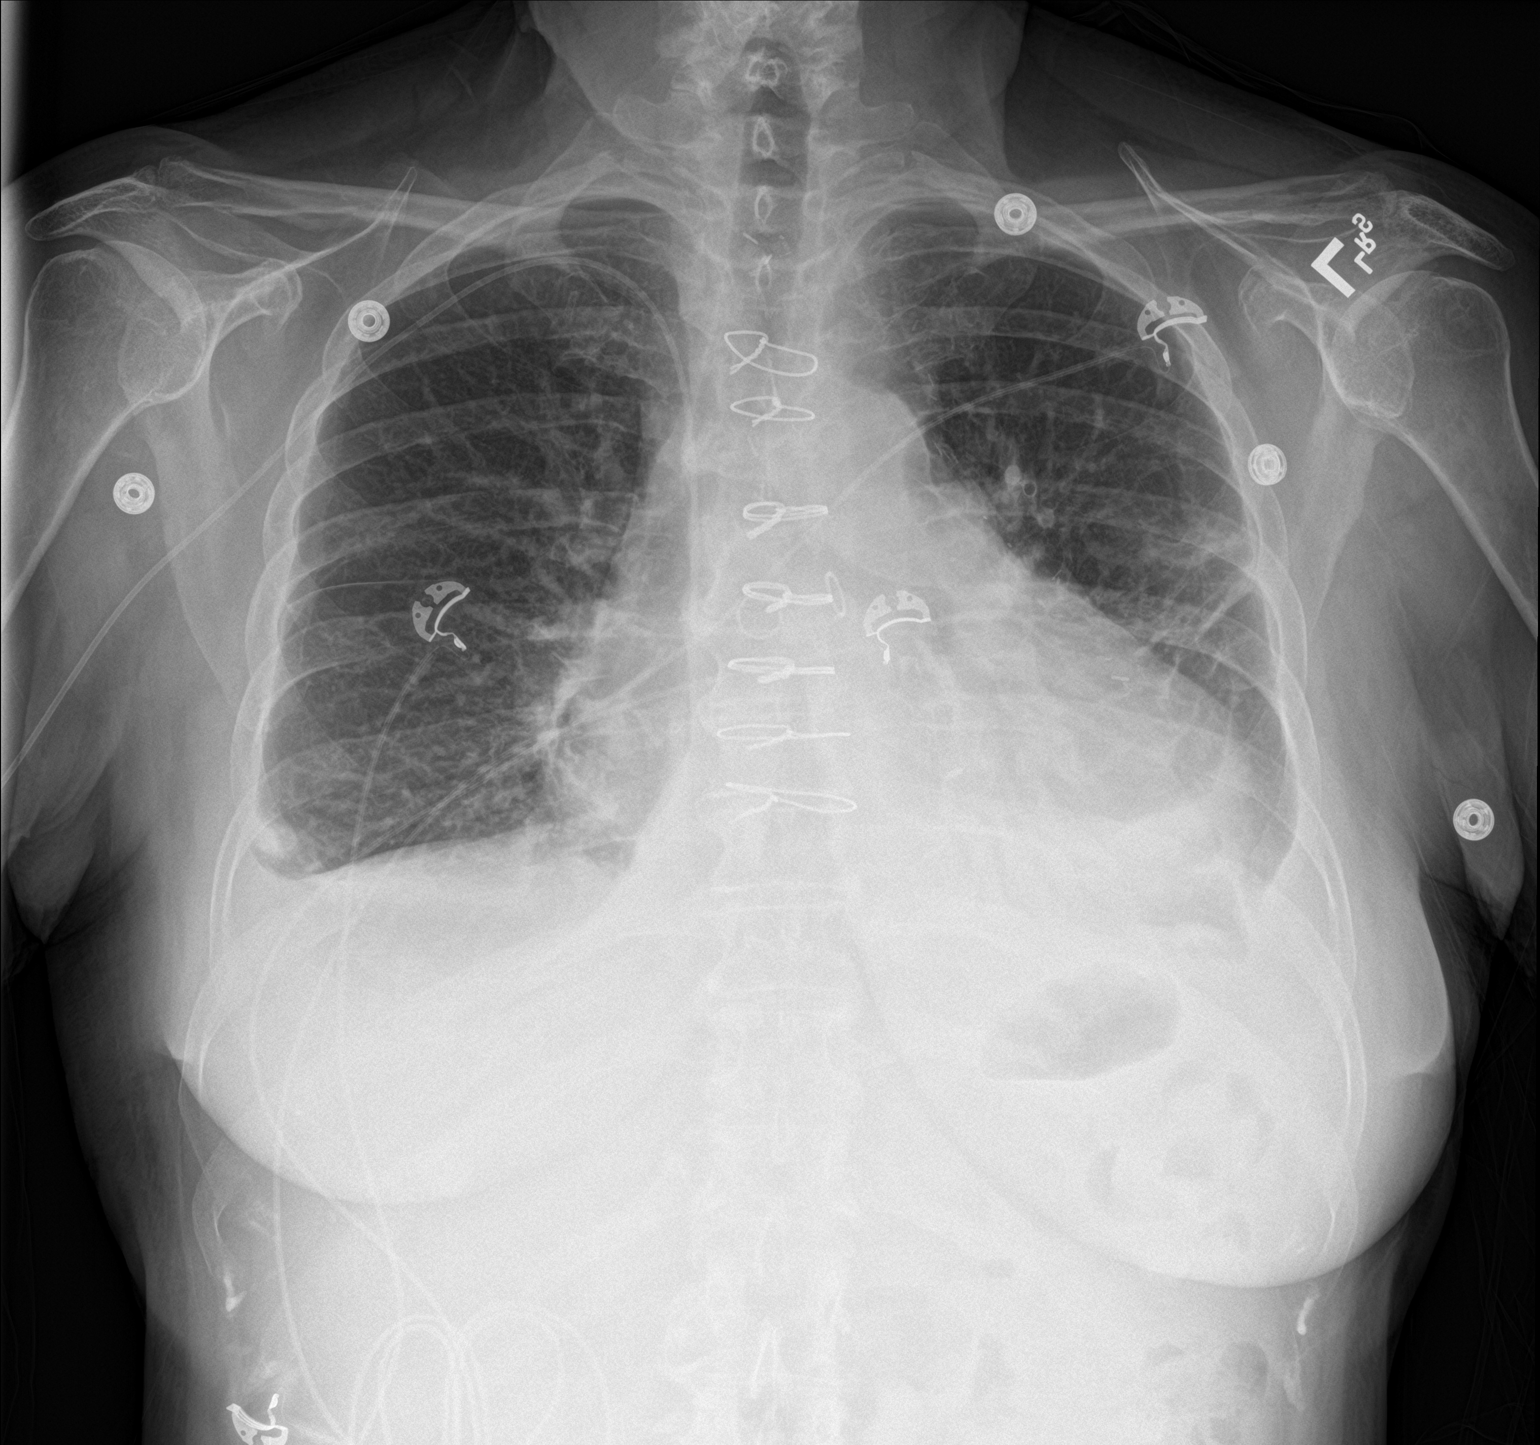

[chest lat]
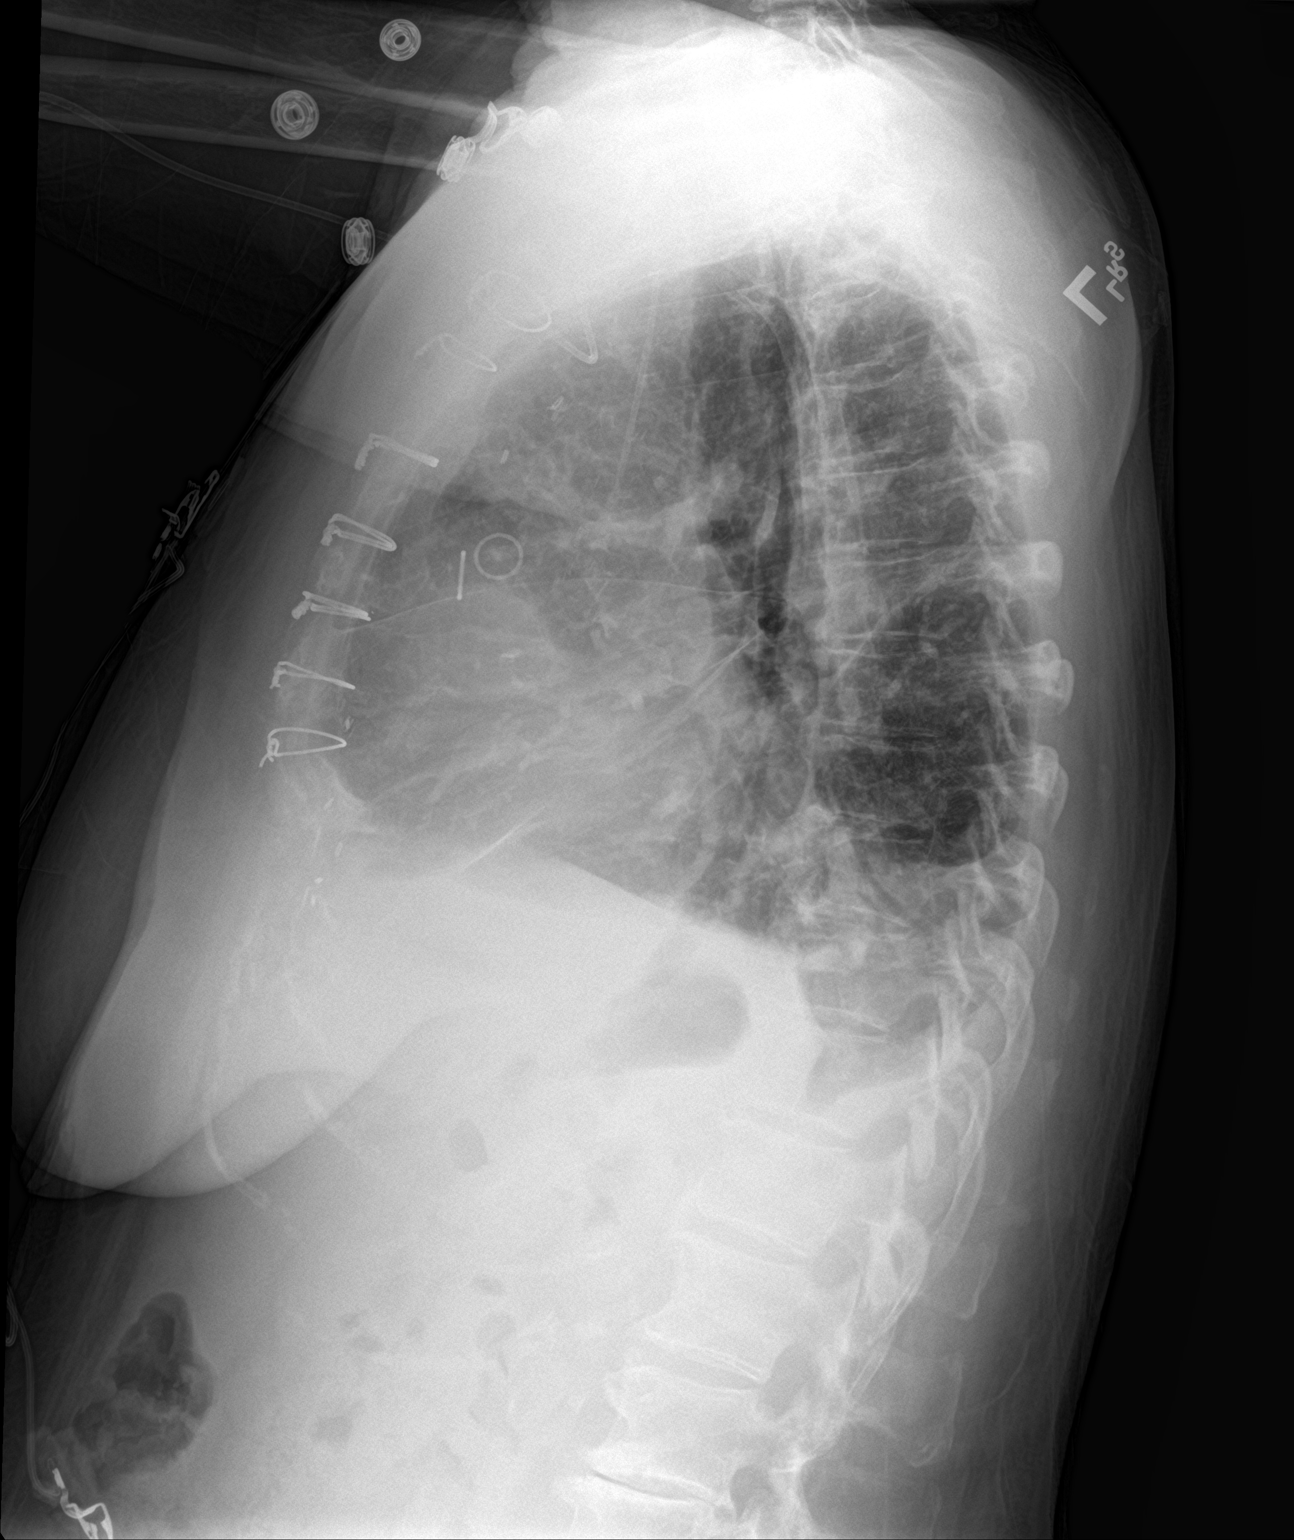

[2 of 2 positions shown; findings below may reference images not displayed]

FINDINGS: The lungs remain mildly hypoinflated. Small bilateral pleural
effusions persist. Bibasilar atelectasis persist greatest on the
left. A small air bronchogram is visible in the left lower lobe. The
cardiac silhouette remains enlarged. The pulmonary vascularity is
less engorged. The sternal wires are intact. The right-sided PICC
line tip projects over the distal third of the SVC.
IMPRESSION: Left lower lobe pneumonia. Bibasilar atelectasis, stable. Small
bilateral pleural effusions, also stable. Slight interval
improvement in pulmonary vascular congestion.

## 2017-07-17 DIAGNOSIS — E1039 Type 1 diabetes mellitus with other diabetic ophthalmic complication: Secondary | ICD-10-CM | POA: Diagnosis not present

## 2017-08-04 DIAGNOSIS — E1039 Type 1 diabetes mellitus with other diabetic ophthalmic complication: Secondary | ICD-10-CM | POA: Diagnosis not present

## 2017-08-04 DIAGNOSIS — E113513 Type 2 diabetes mellitus with proliferative diabetic retinopathy with macular edema, bilateral: Secondary | ICD-10-CM | POA: Diagnosis not present

## 2017-08-14 DIAGNOSIS — I480 Paroxysmal atrial fibrillation: Secondary | ICD-10-CM | POA: Diagnosis not present

## 2017-08-28 DIAGNOSIS — I5022 Chronic systolic (congestive) heart failure: Secondary | ICD-10-CM | POA: Diagnosis not present

## 2017-08-28 DIAGNOSIS — Z1231 Encounter for screening mammogram for malignant neoplasm of breast: Secondary | ICD-10-CM | POA: Diagnosis not present

## 2017-08-28 DIAGNOSIS — E1022 Type 1 diabetes mellitus with diabetic chronic kidney disease: Secondary | ICD-10-CM | POA: Diagnosis not present

## 2017-08-28 DIAGNOSIS — I480 Paroxysmal atrial fibrillation: Secondary | ICD-10-CM | POA: Diagnosis not present

## 2017-08-28 DIAGNOSIS — I2510 Atherosclerotic heart disease of native coronary artery without angina pectoris: Secondary | ICD-10-CM | POA: Diagnosis not present

## 2017-08-28 DIAGNOSIS — Z1211 Encounter for screening for malignant neoplasm of colon: Secondary | ICD-10-CM | POA: Diagnosis not present

## 2017-08-28 DIAGNOSIS — I10 Essential (primary) hypertension: Secondary | ICD-10-CM | POA: Diagnosis not present

## 2017-08-28 DIAGNOSIS — N184 Chronic kidney disease, stage 4 (severe): Secondary | ICD-10-CM | POA: Diagnosis not present

## 2017-09-11 DIAGNOSIS — I480 Paroxysmal atrial fibrillation: Secondary | ICD-10-CM | POA: Diagnosis not present

## 2017-09-25 DIAGNOSIS — I480 Paroxysmal atrial fibrillation: Secondary | ICD-10-CM | POA: Diagnosis not present

## 2017-10-16 DIAGNOSIS — I480 Paroxysmal atrial fibrillation: Secondary | ICD-10-CM | POA: Diagnosis not present

## 2017-10-30 DIAGNOSIS — I480 Paroxysmal atrial fibrillation: Secondary | ICD-10-CM | POA: Diagnosis not present

## 2017-11-05 DIAGNOSIS — E1122 Type 2 diabetes mellitus with diabetic chronic kidney disease: Secondary | ICD-10-CM | POA: Diagnosis not present

## 2017-11-05 DIAGNOSIS — I10 Essential (primary) hypertension: Secondary | ICD-10-CM | POA: Diagnosis not present

## 2017-11-05 DIAGNOSIS — N184 Chronic kidney disease, stage 4 (severe): Secondary | ICD-10-CM | POA: Diagnosis not present

## 2017-11-05 DIAGNOSIS — E875 Hyperkalemia: Secondary | ICD-10-CM | POA: Diagnosis not present

## 2017-11-05 DIAGNOSIS — D631 Anemia in chronic kidney disease: Secondary | ICD-10-CM | POA: Diagnosis not present

## 2017-11-05 DIAGNOSIS — R809 Proteinuria, unspecified: Secondary | ICD-10-CM | POA: Diagnosis not present

## 2017-11-05 DIAGNOSIS — N2581 Secondary hyperparathyroidism of renal origin: Secondary | ICD-10-CM | POA: Diagnosis not present

## 2017-11-13 DIAGNOSIS — I480 Paroxysmal atrial fibrillation: Secondary | ICD-10-CM | POA: Diagnosis not present

## 2017-11-24 DIAGNOSIS — E1039 Type 1 diabetes mellitus with other diabetic ophthalmic complication: Secondary | ICD-10-CM | POA: Diagnosis not present

## 2017-11-24 DIAGNOSIS — E113513 Type 2 diabetes mellitus with proliferative diabetic retinopathy with macular edema, bilateral: Secondary | ICD-10-CM | POA: Diagnosis not present

## 2017-11-27 DIAGNOSIS — I480 Paroxysmal atrial fibrillation: Secondary | ICD-10-CM | POA: Diagnosis not present

## 2017-11-29 ENCOUNTER — Other Ambulatory Visit (HOSPITAL_COMMUNITY): Payer: Self-pay | Admitting: Internal Medicine

## 2017-12-12 DIAGNOSIS — Z23 Encounter for immunization: Secondary | ICD-10-CM | POA: Diagnosis not present

## 2017-12-12 DIAGNOSIS — Z780 Asymptomatic menopausal state: Secondary | ICD-10-CM | POA: Diagnosis not present

## 2017-12-12 DIAGNOSIS — R195 Other fecal abnormalities: Secondary | ICD-10-CM | POA: Diagnosis not present

## 2017-12-12 DIAGNOSIS — N184 Chronic kidney disease, stage 4 (severe): Secondary | ICD-10-CM | POA: Diagnosis not present

## 2017-12-12 DIAGNOSIS — Z1159 Encounter for screening for other viral diseases: Secondary | ICD-10-CM | POA: Diagnosis not present

## 2017-12-12 DIAGNOSIS — Z13820 Encounter for screening for osteoporosis: Secondary | ICD-10-CM | POA: Diagnosis not present

## 2017-12-12 DIAGNOSIS — I255 Ischemic cardiomyopathy: Secondary | ICD-10-CM | POA: Diagnosis not present

## 2017-12-12 DIAGNOSIS — E1022 Type 1 diabetes mellitus with diabetic chronic kidney disease: Secondary | ICD-10-CM | POA: Diagnosis not present

## 2017-12-16 DIAGNOSIS — E1039 Type 1 diabetes mellitus with other diabetic ophthalmic complication: Secondary | ICD-10-CM | POA: Diagnosis not present

## 2017-12-18 DIAGNOSIS — I480 Paroxysmal atrial fibrillation: Secondary | ICD-10-CM | POA: Diagnosis not present

## 2017-12-23 DIAGNOSIS — E1039 Type 1 diabetes mellitus with other diabetic ophthalmic complication: Secondary | ICD-10-CM | POA: Diagnosis not present

## 2018-02-02 DIAGNOSIS — E871 Hypo-osmolality and hyponatremia: Secondary | ICD-10-CM | POA: Diagnosis not present

## 2018-02-02 DIAGNOSIS — N390 Urinary tract infection, site not specified: Secondary | ICD-10-CM | POA: Diagnosis not present

## 2018-02-02 DIAGNOSIS — I132 Hypertensive heart and chronic kidney disease with heart failure and with stage 5 chronic kidney disease, or end stage renal disease: Secondary | ICD-10-CM | POA: Diagnosis not present

## 2018-02-02 DIAGNOSIS — N3000 Acute cystitis without hematuria: Secondary | ICD-10-CM | POA: Diagnosis not present

## 2018-02-02 DIAGNOSIS — N186 End stage renal disease: Secondary | ICD-10-CM | POA: Diagnosis not present

## 2018-02-02 DIAGNOSIS — I509 Heart failure, unspecified: Secondary | ICD-10-CM | POA: Diagnosis not present

## 2018-02-02 DIAGNOSIS — I110 Hypertensive heart disease with heart failure: Secondary | ICD-10-CM | POA: Diagnosis not present

## 2018-02-02 DIAGNOSIS — R0602 Shortness of breath: Secondary | ICD-10-CM | POA: Diagnosis not present

## 2018-02-02 DIAGNOSIS — I5023 Acute on chronic systolic (congestive) heart failure: Secondary | ICD-10-CM | POA: Diagnosis not present

## 2018-02-02 DIAGNOSIS — J90 Pleural effusion, not elsewhere classified: Secondary | ICD-10-CM | POA: Diagnosis not present

## 2018-02-02 DIAGNOSIS — N179 Acute kidney failure, unspecified: Secondary | ICD-10-CM | POA: Diagnosis not present

## 2018-02-02 DIAGNOSIS — R140 Abdominal distension (gaseous): Secondary | ICD-10-CM | POA: Diagnosis not present

## 2018-02-02 DIAGNOSIS — J9811 Atelectasis: Secondary | ICD-10-CM | POA: Diagnosis not present

## 2018-02-04 DIAGNOSIS — I517 Cardiomegaly: Secondary | ICD-10-CM | POA: Diagnosis not present

## 2018-02-04 DIAGNOSIS — I120 Hypertensive chronic kidney disease with stage 5 chronic kidney disease or end stage renal disease: Secondary | ICD-10-CM | POA: Diagnosis not present

## 2018-02-04 DIAGNOSIS — N183 Chronic kidney disease, stage 3 (moderate): Secondary | ICD-10-CM | POA: Diagnosis not present

## 2018-02-04 DIAGNOSIS — R945 Abnormal results of liver function studies: Secondary | ICD-10-CM | POA: Diagnosis not present

## 2018-02-04 DIAGNOSIS — I509 Heart failure, unspecified: Secondary | ICD-10-CM | POA: Diagnosis not present

## 2018-02-04 DIAGNOSIS — I2510 Atherosclerotic heart disease of native coronary artery without angina pectoris: Secondary | ICD-10-CM | POA: Diagnosis present

## 2018-02-04 DIAGNOSIS — I5023 Acute on chronic systolic (congestive) heart failure: Secondary | ICD-10-CM | POA: Diagnosis present

## 2018-02-04 DIAGNOSIS — R140 Abdominal distension (gaseous): Secondary | ICD-10-CM | POA: Diagnosis not present

## 2018-02-04 DIAGNOSIS — I110 Hypertensive heart disease with heart failure: Secondary | ICD-10-CM | POA: Diagnosis not present

## 2018-02-04 DIAGNOSIS — E871 Hypo-osmolality and hyponatremia: Secondary | ICD-10-CM | POA: Diagnosis present

## 2018-02-04 DIAGNOSIS — I361 Nonrheumatic tricuspid (valve) insufficiency: Secondary | ICD-10-CM | POA: Diagnosis not present

## 2018-02-04 DIAGNOSIS — R443 Hallucinations, unspecified: Secondary | ICD-10-CM | POA: Diagnosis not present

## 2018-02-04 DIAGNOSIS — Z794 Long term (current) use of insulin: Secondary | ICD-10-CM | POA: Diagnosis not present

## 2018-02-04 DIAGNOSIS — J90 Pleural effusion, not elsewhere classified: Secondary | ICD-10-CM | POA: Diagnosis not present

## 2018-02-04 DIAGNOSIS — I132 Hypertensive heart and chronic kidney disease with heart failure and with stage 5 chronic kidney disease, or end stage renal disease: Secondary | ICD-10-CM | POA: Diagnosis present

## 2018-02-04 DIAGNOSIS — Z4901 Encounter for fitting and adjustment of extracorporeal dialysis catheter: Secondary | ICD-10-CM | POA: Diagnosis not present

## 2018-02-04 DIAGNOSIS — R0602 Shortness of breath: Secondary | ICD-10-CM | POA: Diagnosis not present

## 2018-02-04 DIAGNOSIS — E878 Other disorders of electrolyte and fluid balance, not elsewhere classified: Secondary | ICD-10-CM | POA: Diagnosis present

## 2018-02-04 DIAGNOSIS — N184 Chronic kidney disease, stage 4 (severe): Secondary | ICD-10-CM | POA: Diagnosis not present

## 2018-02-04 DIAGNOSIS — N186 End stage renal disease: Secondary | ICD-10-CM | POA: Diagnosis present

## 2018-02-04 DIAGNOSIS — E8770 Fluid overload, unspecified: Secondary | ICD-10-CM | POA: Diagnosis not present

## 2018-02-04 DIAGNOSIS — Z992 Dependence on renal dialysis: Secondary | ICD-10-CM | POA: Diagnosis not present

## 2018-02-04 DIAGNOSIS — I130 Hypertensive heart and chronic kidney disease with heart failure and stage 1 through stage 4 chronic kidney disease, or unspecified chronic kidney disease: Secondary | ICD-10-CM | POA: Diagnosis not present

## 2018-02-04 DIAGNOSIS — N390 Urinary tract infection, site not specified: Secondary | ICD-10-CM | POA: Diagnosis not present

## 2018-02-04 DIAGNOSIS — Z7901 Long term (current) use of anticoagulants: Secondary | ICD-10-CM | POA: Diagnosis not present

## 2018-02-04 DIAGNOSIS — E109 Type 1 diabetes mellitus without complications: Secondary | ICD-10-CM | POA: Diagnosis not present

## 2018-02-04 DIAGNOSIS — R932 Abnormal findings on diagnostic imaging of liver and biliary tract: Secondary | ICD-10-CM | POA: Diagnosis not present

## 2018-02-04 DIAGNOSIS — Z452 Encounter for adjustment and management of vascular access device: Secondary | ICD-10-CM | POA: Diagnosis not present

## 2018-02-04 DIAGNOSIS — I255 Ischemic cardiomyopathy: Secondary | ICD-10-CM | POA: Diagnosis present

## 2018-02-04 DIAGNOSIS — N19 Unspecified kidney failure: Secondary | ICD-10-CM | POA: Diagnosis not present

## 2018-02-04 DIAGNOSIS — I5020 Unspecified systolic (congestive) heart failure: Secondary | ICD-10-CM | POA: Diagnosis not present

## 2018-02-04 DIAGNOSIS — E1065 Type 1 diabetes mellitus with hyperglycemia: Secondary | ICD-10-CM | POA: Diagnosis present

## 2018-02-04 DIAGNOSIS — Z951 Presence of aortocoronary bypass graft: Secondary | ICD-10-CM | POA: Diagnosis not present

## 2018-02-04 DIAGNOSIS — E1165 Type 2 diabetes mellitus with hyperglycemia: Secondary | ICD-10-CM | POA: Diagnosis not present

## 2018-02-04 DIAGNOSIS — Z7982 Long term (current) use of aspirin: Secondary | ICD-10-CM | POA: Diagnosis not present

## 2018-02-04 DIAGNOSIS — R110 Nausea: Secondary | ICD-10-CM | POA: Diagnosis not present

## 2018-02-04 DIAGNOSIS — I349 Nonrheumatic mitral valve disorder, unspecified: Secondary | ICD-10-CM | POA: Diagnosis not present

## 2018-02-04 DIAGNOSIS — I129 Hypertensive chronic kidney disease with stage 1 through stage 4 chronic kidney disease, or unspecified chronic kidney disease: Secondary | ICD-10-CM | POA: Diagnosis not present

## 2018-02-04 DIAGNOSIS — R791 Abnormal coagulation profile: Secondary | ICD-10-CM | POA: Diagnosis not present

## 2018-02-04 DIAGNOSIS — R188 Other ascites: Secondary | ICD-10-CM | POA: Diagnosis not present

## 2018-02-04 DIAGNOSIS — D631 Anemia in chronic kidney disease: Secondary | ICD-10-CM | POA: Diagnosis present

## 2018-02-04 DIAGNOSIS — I4891 Unspecified atrial fibrillation: Secondary | ICD-10-CM | POA: Diagnosis not present

## 2018-02-04 DIAGNOSIS — N179 Acute kidney failure, unspecified: Secondary | ICD-10-CM | POA: Diagnosis present

## 2018-02-04 DIAGNOSIS — E785 Hyperlipidemia, unspecified: Secondary | ICD-10-CM | POA: Diagnosis present

## 2018-02-04 DIAGNOSIS — J811 Chronic pulmonary edema: Secondary | ICD-10-CM | POA: Diagnosis not present

## 2018-02-04 DIAGNOSIS — K7689 Other specified diseases of liver: Secondary | ICD-10-CM | POA: Diagnosis present

## 2018-02-04 DIAGNOSIS — I480 Paroxysmal atrial fibrillation: Secondary | ICD-10-CM | POA: Diagnosis present

## 2018-02-04 DIAGNOSIS — E1022 Type 1 diabetes mellitus with diabetic chronic kidney disease: Secondary | ICD-10-CM | POA: Diagnosis present

## 2018-02-04 DIAGNOSIS — Z9114 Patient's other noncompliance with medication regimen: Secondary | ICD-10-CM | POA: Diagnosis not present

## 2018-02-04 DIAGNOSIS — D649 Anemia, unspecified: Secondary | ICD-10-CM | POA: Diagnosis not present

## 2018-02-23 DIAGNOSIS — N186 End stage renal disease: Secondary | ICD-10-CM | POA: Insufficient documentation

## 2018-03-04 DIAGNOSIS — I5022 Chronic systolic (congestive) heart failure: Secondary | ICD-10-CM | POA: Diagnosis not present

## 2018-03-04 DIAGNOSIS — Z794 Long term (current) use of insulin: Secondary | ICD-10-CM | POA: Diagnosis not present

## 2018-03-04 DIAGNOSIS — D649 Anemia, unspecified: Secondary | ICD-10-CM | POA: Diagnosis not present

## 2018-03-04 DIAGNOSIS — E1022 Type 1 diabetes mellitus with diabetic chronic kidney disease: Secondary | ICD-10-CM | POA: Diagnosis not present

## 2018-03-04 DIAGNOSIS — Z7901 Long term (current) use of anticoagulants: Secondary | ICD-10-CM | POA: Diagnosis not present

## 2018-03-04 DIAGNOSIS — I2510 Atherosclerotic heart disease of native coronary artery without angina pectoris: Secondary | ICD-10-CM | POA: Diagnosis not present

## 2018-03-04 DIAGNOSIS — E785 Hyperlipidemia, unspecified: Secondary | ICD-10-CM | POA: Diagnosis not present

## 2018-03-04 DIAGNOSIS — N186 End stage renal disease: Secondary | ICD-10-CM | POA: Diagnosis not present

## 2018-03-04 DIAGNOSIS — I480 Paroxysmal atrial fibrillation: Secondary | ICD-10-CM | POA: Diagnosis not present

## 2018-03-04 DIAGNOSIS — I132 Hypertensive heart and chronic kidney disease with heart failure and with stage 5 chronic kidney disease, or end stage renal disease: Secondary | ICD-10-CM | POA: Diagnosis not present

## 2018-03-04 DIAGNOSIS — Z992 Dependence on renal dialysis: Secondary | ICD-10-CM | POA: Diagnosis not present

## 2018-03-11 DIAGNOSIS — E1022 Type 1 diabetes mellitus with diabetic chronic kidney disease: Secondary | ICD-10-CM | POA: Diagnosis not present

## 2018-03-11 DIAGNOSIS — N186 End stage renal disease: Secondary | ICD-10-CM | POA: Diagnosis not present

## 2018-03-11 DIAGNOSIS — I480 Paroxysmal atrial fibrillation: Secondary | ICD-10-CM | POA: Diagnosis not present

## 2018-03-11 DIAGNOSIS — I2510 Atherosclerotic heart disease of native coronary artery without angina pectoris: Secondary | ICD-10-CM | POA: Diagnosis not present

## 2018-03-11 DIAGNOSIS — I5022 Chronic systolic (congestive) heart failure: Secondary | ICD-10-CM | POA: Diagnosis not present

## 2018-03-11 DIAGNOSIS — I132 Hypertensive heart and chronic kidney disease with heart failure and with stage 5 chronic kidney disease, or end stage renal disease: Secondary | ICD-10-CM | POA: Diagnosis not present

## 2018-03-12 ENCOUNTER — Emergency Department
Admission: EM | Admit: 2018-03-12 | Discharge: 2018-03-12 | Disposition: A | Discharge status: Home / Self Care | Payer: Medicare Other | Attending: Emergency Medicine | Admitting: Emergency Medicine

## 2018-03-12 ENCOUNTER — Encounter: Payer: Self-pay | Admitting: Intensive Care

## 2018-03-12 ENCOUNTER — Other Ambulatory Visit: Payer: Self-pay

## 2018-03-12 DIAGNOSIS — I2510 Atherosclerotic heart disease of native coronary artery without angina pectoris: Secondary | ICD-10-CM | POA: Diagnosis not present

## 2018-03-12 DIAGNOSIS — E1122 Type 2 diabetes mellitus with diabetic chronic kidney disease: Secondary | ICD-10-CM | POA: Diagnosis not present

## 2018-03-12 DIAGNOSIS — E1022 Type 1 diabetes mellitus with diabetic chronic kidney disease: Secondary | ICD-10-CM | POA: Diagnosis not present

## 2018-03-12 DIAGNOSIS — Z940 Kidney transplant status: Secondary | ICD-10-CM | POA: Insufficient documentation

## 2018-03-12 DIAGNOSIS — Z7901 Long term (current) use of anticoagulants: Secondary | ICD-10-CM | POA: Diagnosis not present

## 2018-03-12 DIAGNOSIS — I5022 Chronic systolic (congestive) heart failure: Secondary | ICD-10-CM | POA: Insufficient documentation

## 2018-03-12 DIAGNOSIS — Z7982 Long term (current) use of aspirin: Secondary | ICD-10-CM | POA: Diagnosis not present

## 2018-03-12 DIAGNOSIS — R110 Nausea: Secondary | ICD-10-CM | POA: Diagnosis not present

## 2018-03-12 DIAGNOSIS — R748 Abnormal levels of other serum enzymes: Secondary | ICD-10-CM | POA: Diagnosis not present

## 2018-03-12 DIAGNOSIS — Z79899 Other long term (current) drug therapy: Secondary | ICD-10-CM | POA: Insufficient documentation

## 2018-03-12 DIAGNOSIS — Z794 Long term (current) use of insulin: Secondary | ICD-10-CM | POA: Diagnosis not present

## 2018-03-12 DIAGNOSIS — N186 End stage renal disease: Secondary | ICD-10-CM | POA: Diagnosis not present

## 2018-03-12 DIAGNOSIS — T829XXA Unspecified complication of cardiac and vascular prosthetic device, implant and graft, initial encounter: Secondary | ICD-10-CM

## 2018-03-12 DIAGNOSIS — T8249XA Other complication of vascular dialysis catheter, initial encounter: Secondary | ICD-10-CM | POA: Diagnosis not present

## 2018-03-12 DIAGNOSIS — I480 Paroxysmal atrial fibrillation: Secondary | ICD-10-CM | POA: Diagnosis not present

## 2018-03-12 DIAGNOSIS — Z951 Presence of aortocoronary bypass graft: Secondary | ICD-10-CM | POA: Insufficient documentation

## 2018-03-12 DIAGNOSIS — I132 Hypertensive heart and chronic kidney disease with heart failure and with stage 5 chronic kidney disease, or end stage renal disease: Secondary | ICD-10-CM | POA: Diagnosis not present

## 2018-03-12 DIAGNOSIS — Z992 Dependence on renal dialysis: Secondary | ICD-10-CM | POA: Diagnosis not present

## 2018-03-12 DIAGNOSIS — D649 Anemia, unspecified: Secondary | ICD-10-CM | POA: Diagnosis not present

## 2018-03-12 LAB — CBC
HCT: 25.6 % — ABNORMAL LOW (ref 35.0–47.0)
Hemoglobin: 8.8 g/dL — ABNORMAL LOW (ref 12.0–16.0)
MCH: 27.9 pg (ref 26.0–34.0)
MCHC: 34.6 g/dL (ref 32.0–36.0)
MCV: 80.7 fL (ref 80.0–100.0)
PLATELETS: 172 10*3/uL (ref 150–440)
RBC: 3.17 MIL/uL — AB (ref 3.80–5.20)
RDW: 24.1 % — AB (ref 11.5–14.5)
WBC: 4.3 10*3/uL (ref 3.6–11.0)

## 2018-03-12 LAB — COMPREHENSIVE METABOLIC PANEL
ALK PHOS: 921 U/L — AB (ref 38–126)
ALT: 57 U/L — AB (ref 0–44)
AST: 23 U/L (ref 15–41)
Albumin: 3.5 g/dL (ref 3.5–5.0)
Anion gap: 18 — ABNORMAL HIGH (ref 5–15)
BUN: 107 mg/dL — AB (ref 8–23)
CALCIUM: 7.5 mg/dL — AB (ref 8.9–10.3)
CHLORIDE: 96 mmol/L — AB (ref 98–111)
CO2: 19 mmol/L — AB (ref 22–32)
CREATININE: 7.68 mg/dL — AB (ref 0.44–1.00)
GFR, EST AFRICAN AMERICAN: 6 mL/min — AB (ref >60)
GFR, EST NON AFRICAN AMERICAN: 5 mL/min — AB (ref >60)
Glucose, Bld: 293 mg/dL — ABNORMAL HIGH (ref 70–99)
Potassium: 3.7 mmol/L (ref 3.5–5.1)
SODIUM: 133 mmol/L — AB (ref 135–145)
Total Bilirubin: 1.2 mg/dL (ref 0.3–1.2)
Total Protein: 6.8 g/dL (ref 6.5–8.1)

## 2018-03-12 LAB — LIPASE, BLOOD: LIPASE: 38 U/L (ref 11–51)

## 2018-03-12 NOTE — ED Triage Notes (Addendum)
First RN Note: Pt presents to ED via POV, states was called and told by her Dr's office to report to ED for "emergency dialysis". Pt states is not currently a dialysis patient at this time.

## 2018-03-12 NOTE — ED Triage Notes (Signed)
Patient reports her PCP sent her here to have emergent dialysis because she missed her appointments Tuesday and Today. Last dialysis was September 27th. Patient only c/o abdominal swelling

## 2018-03-12 NOTE — ED Provider Notes (Signed)
[facility] Emergency Department Provider Note  Time seen: 6:26 PM  I have reviewed the triage vital signs and the nursing notes.   HISTORY  Chief Complaint No chief complaint on file.    HPI Theresa Rogers is a 67 y.o. female with a past medical history of asthma, CAD, CHF, diabetes, CKD now ESRD on hemodialysis Tuesday/Thursday/Saturday x1 week, presents to the emergency department sent by the dialysis center.  According to the patient she is recently started on dialysis 1 week ago.  Was discharged approximately 1 week ago was supposed to go to dialysis Tuesday and she missed it, she went this afternoon but since her appointment was this morning there is no staff there to perform her dialysis so they recommended to go to the emergency department.  Patient does state nausea, but denies any shortness of breath, denies any vomiting.  Has no other symptoms.  States she did not believe she needed to come.   Past Medical History:  Diagnosis Date  . Anemia   . CAD (coronary artery disease)    a. s/p MI/syncope in 2009 s/p remote stenting; b. NSTEMI 06/2015, cardiac cath 06/2015 pLAD 70% FFR 0.88 not clinically sig, dLAD 90%, mLCx 80% s/p PCI/DES 0%, pRCA 70%, mRCA 80%, dRCA 80%; c. cath 05/2016 mLAD 95%, dLAD 90%, dLAD 90%, pRCA 70%, mRCA 90%, dRCA 80%  . Chronic systolic CHF (congestive heart failure) ([place])   . CKD (chronic kidney disease), stage III ([place])   . Diabetes mellitus without complication ([place])   . HLD (hyperlipidemia)   . HTN (hypertension)   . Ischemic cardiomyopathy    a. echo 06/2015: EF 30-35%, diffuse HK, nable to exclude RWMA, LV diastolic function parameters were normal, mild MR, left atrium was mildly dilated, PASP 28 mm Hg, IVC was dilated c/w elevated CVP; b. echo 05/2016: EF 20%, severe global HK with AK in ant, anteroseptal, apical wall, GR1DD, mild MR, mod dilated LA, RV sys fxn mod reduced, PASP 53 mmHg, triv pericardial effusion, L pleural  effusion  . PAF (paroxysmal atrial fibrillation) ([place])    a. new onset 06/2015; b. CHADS2VASc = 5 (CHF, HTN, age x 1, vascular dz, female); c. not on long term full dose anticoagulation in an effort to avoid triple therapy given her anemia     Patient Active Problem List   Diagnosis Date Noted  . Atrial fibrillation ([place]) [I48.91] 06/19/2016  . S/P CABG x 3 05/27/2016  . Pulmonary hypertension ([place]) 05/20/2016  . Heart failure ([place]) 05/15/2016  . Cardiorenal syndrome   . Bilateral lower extremity edema   . Dyspnea   . Coronary artery disease of bypass graft of native heart with stable angina pectoris ([place])   . Cardiomyopathy, ischemic   . Systolic dysfunction   . Poorly controlled type 2 diabetes mellitus with complication ([place])   . Radial artery injury 08/03/2015  . Diabetes mellitus with complication ([place]) 81/06/7508  . Chronic kidney disease, stage III (moderate) ([facility])   . HLD (hyperlipidemia)   . HTN (hypertension)   . Anemia   . PAF (paroxysmal atrial fibrillation) ([place])   . Chronic systolic CHF (congestive heart failure) ([place])   . Ischemic cardiomyopathy   . CAD (coronary artery disease)   . Congestive dilated cardiomyopathy ([place])   . Coronary artery disease involving native coronary artery of native heart without angina pectoris     Past Surgical History:  Procedure Laterality Date  . CARDIAC CATHETERIZATION N/A 06/22/2015   Procedure: Coronary Angiogram;  Surgeon: Minna Merritts, MD;  Location: [place] [facility];  Service: Cardiovascular;  Laterality: N/A;  . CARDIAC CATHETERIZATION N/A 06/26/2015   Procedure: Coronary Stent Intervention;  Surgeon: Wellington Hampshire, MD;  Location: [place] [facility];  Service: Cardiovascular;  Laterality: N/A;  . CARDIAC CATHETERIZATION N/A 08/03/2015   Procedure: Coronary Stent Intervention;  Surgeon: Wellington Hampshire, MD;  Location: [place] [facility];  Service: Cardiovascular;  Laterality: N/A;  . CARDIAC CATHETERIZATION N/A  05/20/2016   Procedure: Right/Left Heart Cath and Coronary Angiography;  Surgeon: Wellington Hampshire, MD;  Location: [place] [facility];  Service: Cardiovascular;  Laterality: N/A;  . CORONARY ANGIOPLASTY WITH STENT PLACEMENT    . CORONARY ARTERY BYPASS GRAFT N/A 05/27/2016   Procedure: CORONARY ARTERY BYPASS GRAFTING (CABG) x3 using left internal mammary artery and left greater saphenous vein harvested endoscopically;  Surgeon: Ivin Poot, MD;  Location: [place];  Service: Open Heart Surgery;  Laterality: N/A;  . IR GENERIC HISTORICAL  06/04/2016   IR US GUIDE VASC ACCESS RIGHT 06/04/2016 Corrie Mckusick, DO [facility]  . IR GENERIC HISTORICAL  06/04/2016   IR FLUORO GUIDE CV LINE RIGHT 06/04/2016 Corrie Mckusick, DO [facility]  . TEE WITHOUT CARDIOVERSION N/A 05/27/2016   Procedure: TRANSESOPHAGEAL ECHOCARDIOGRAM (TEE);  Surgeon: Ivin Poot, MD;  Location: [place];  Service: Open Heart Surgery;  Laterality: N/A;    Prior to Admission medications   Medication Sig Start Date End Date Taking? Authorizing Provider  amiodarone (PACERONE) 200 MG tablet TAKE 1 TABLET BY MOUTH DAILY 10/17/16   Bensimhon, Shaune Pascal, MD  aspirin EC 81 MG tablet Take 81 mg by mouth daily.    [provider]  atorvastatin (LIPITOR) 40 MG tablet Take 1 tablet (40 mg total) by mouth daily at 6 PM. 06/27/15   Hillary Bow, MD  carvedilol (COREG) 3.125 MG tablet Take 1 tablet (3.125 mg total) by mouth 2 (two) times daily. 03/25/17 06/23/17  Bensimhon, Shaune Pascal, MD  ferrous sulfate 325 (65 FE) MG EC tablet Take 325 mg by mouth daily.    [provider]  hydrALAZINE (APRESOLINE) 25 MG tablet Take 25 mg by mouth every 12 (twelve) hours.    [provider]  insulin glargine (LANTUS) 100 UNIT/ML injection Inject 12 Units into the skin at bedtime.    [provider]  insulin lispro (HUMALOG) 100 UNIT/ML injection Inject 5-12 Units into the skin 3 (three) times daily before meals. Per sliding  scale    [provider]  torsemide (DEMADEX) 20 MG tablet Take 40 mg by mouth daily.    [provider]  torsemide (DEMADEX) 20 MG tablet TAKE 2 TABLETS BY MOUTH TWICE DAILY 12/01/17   Bensimhon, Shaune Pascal, MD  warfarin (COUMADIN) 1 MG tablet Take as directed by [facility] 02/12/17   Minna Merritts, MD    Allergies  Allergen Reactions  . No Known Allergies     Family History  Problem Relation Age of Onset  . CAD Mother   . Alzheimer's disease Mother   . Prostate cancer Father   . Diabetes Brother   . Kidney failure Brother     Social History Social History   Tobacco Use  . Smoking status: Never Smoker  . Smokeless tobacco: Never Used  Substance Use Topics  . Alcohol use: No    Alcohol/week: 0.0 standard drinks  . Drug use: No    Review of Systems Constitutional: Negative for fever. Cardiovascular:  Negative for chest pain. Respiratory: Negative for shortness of breath. Gastrointestinal: Negative for abdominal pain, vomiting and diarrhea.  Positive for nausea. Genitourinary: Negative for urinary compaints Musculoskeletal: Negative for musculoskeletal complaints Skin: Negative for skin complaints  Neurological: Negative for headache All other ROS negative  ____________________________________________   PHYSICAL EXAM:  VITAL SIGNS: ED Triage Vitals [03/12/18 1653]  Enc Vitals Group     BP (!) 162/68     Pulse Rate 72     Resp 18     Temp 97.6 F (36.4 C)     Temp Source Oral     SpO2 95 %     Weight 160 lb (72.6 kg)     Height 5\' 4"  (1.626 m)     Head Circumference      Peak Flow      Pain Score 0     Pain Loc      Pain Edu?      Excl. in [place]?    Constitutional: Alert and oriented. Well appearing and in no distress. Eyes: Normal exam ENT   Head: Normocephalic and atraumatic.   Mouth/Throat: Mucous membranes are moist. Cardiovascular: Normal rate, regular rhythm. Respiratory: Normal respiratory effort without tachypnea  nor retractions. Breath sounds are clear  Gastrointestinal: Soft and nontender. No distention.  Musculoskeletal: Nontender with normal range of motion in all extremities.  Neurologic:  Normal speech and language. No gross focal neurologic deficits  Skin:  Skin is warm, dry and intact.  Psychiatric: Mood and affect are normal. Speech and behavior are normal.   ____________________________________________   INITIAL IMPRESSION / ASSESSMENT AND PLAN / ED COURSE  Pertinent labs & imaging results that were available during my care of the patient were reviewed by me and considered in my medical decision making (see chart for details).  Patient presents to the emergency department for missing dialysis.  Patient states she was last dialyzed on Saturday.  She does make urine every day, states of the last 24 hours she has become somewhat nauseated but denies any vomiting.  Denies any shortness of breath.  Has no pain complaints.  Patient was told by dialysis come to the emergency department since she missed 2 sessions.  Patient's lab work has resulted showing an elevated BUN to 107 which could explain her nausea.  Reassuringly patient's potassium is normal 3.7.  Patient is satting 95% on room air.  I discussed the patient with Dr. Holley Raring, after reviewing her labs she believes the patient is safe for discharge home and has arranged for the patient to receive dialysis tomorrow at 12:30 PM.  I discussed this with the patient she is agreeable to this plan of care.  ____________________________________________   FINAL CLINICAL IMPRESSION(S) / ED DIAGNOSES  Evaluation for dialysis    Harvest Dark, MD 03/12/18 [phone]

## 2018-03-12 NOTE — Discharge Instructions (Addendum)
Please follow-up tomorrow at your dialysis center at 12:30 PM for scheduled dialysis.  Return to the emergency department for any trouble breathing, vomiting, or any other symptom personally concerning to yourself.

## 2018-03-13 DIAGNOSIS — T82898A Other specified complication of vascular prosthetic devices, implants and grafts, initial encounter: Secondary | ICD-10-CM | POA: Diagnosis not present

## 2018-03-13 DIAGNOSIS — Z992 Dependence on renal dialysis: Secondary | ICD-10-CM | POA: Diagnosis not present

## 2018-03-13 DIAGNOSIS — N186 End stage renal disease: Secondary | ICD-10-CM | POA: Diagnosis not present

## 2018-03-13 DIAGNOSIS — N2581 Secondary hyperparathyroidism of renal origin: Secondary | ICD-10-CM | POA: Diagnosis not present

## 2018-03-13 DIAGNOSIS — D509 Iron deficiency anemia, unspecified: Secondary | ICD-10-CM | POA: Diagnosis not present

## 2018-03-13 DIAGNOSIS — D631 Anemia in chronic kidney disease: Secondary | ICD-10-CM | POA: Diagnosis not present

## 2018-03-14 DIAGNOSIS — D631 Anemia in chronic kidney disease: Secondary | ICD-10-CM | POA: Diagnosis not present

## 2018-03-14 DIAGNOSIS — N186 End stage renal disease: Secondary | ICD-10-CM | POA: Diagnosis not present

## 2018-03-14 DIAGNOSIS — N2581 Secondary hyperparathyroidism of renal origin: Secondary | ICD-10-CM | POA: Diagnosis not present

## 2018-03-14 DIAGNOSIS — T82898A Other specified complication of vascular prosthetic devices, implants and grafts, initial encounter: Secondary | ICD-10-CM | POA: Diagnosis not present

## 2018-03-14 DIAGNOSIS — Z992 Dependence on renal dialysis: Secondary | ICD-10-CM | POA: Diagnosis not present

## 2018-03-14 DIAGNOSIS — D509 Iron deficiency anemia, unspecified: Secondary | ICD-10-CM | POA: Diagnosis not present

## 2018-03-16 DIAGNOSIS — I132 Hypertensive heart and chronic kidney disease with heart failure and with stage 5 chronic kidney disease, or end stage renal disease: Secondary | ICD-10-CM | POA: Diagnosis not present

## 2018-03-16 DIAGNOSIS — N186 End stage renal disease: Secondary | ICD-10-CM | POA: Diagnosis not present

## 2018-03-16 DIAGNOSIS — I480 Paroxysmal atrial fibrillation: Secondary | ICD-10-CM | POA: Diagnosis not present

## 2018-03-16 DIAGNOSIS — I5022 Chronic systolic (congestive) heart failure: Secondary | ICD-10-CM | POA: Diagnosis not present

## 2018-03-16 DIAGNOSIS — I2510 Atherosclerotic heart disease of native coronary artery without angina pectoris: Secondary | ICD-10-CM | POA: Diagnosis not present

## 2018-03-16 DIAGNOSIS — E1022 Type 1 diabetes mellitus with diabetic chronic kidney disease: Secondary | ICD-10-CM | POA: Diagnosis not present

## 2018-03-17 DIAGNOSIS — N2581 Secondary hyperparathyroidism of renal origin: Secondary | ICD-10-CM | POA: Diagnosis not present

## 2018-03-17 DIAGNOSIS — D631 Anemia in chronic kidney disease: Secondary | ICD-10-CM | POA: Diagnosis not present

## 2018-03-17 DIAGNOSIS — T82898A Other specified complication of vascular prosthetic devices, implants and grafts, initial encounter: Secondary | ICD-10-CM | POA: Diagnosis not present

## 2018-03-17 DIAGNOSIS — Z992 Dependence on renal dialysis: Secondary | ICD-10-CM | POA: Diagnosis not present

## 2018-03-17 DIAGNOSIS — N186 End stage renal disease: Secondary | ICD-10-CM | POA: Diagnosis not present

## 2018-03-17 DIAGNOSIS — D509 Iron deficiency anemia, unspecified: Secondary | ICD-10-CM | POA: Diagnosis not present

## 2018-03-19 DIAGNOSIS — N186 End stage renal disease: Secondary | ICD-10-CM | POA: Diagnosis not present

## 2018-03-19 DIAGNOSIS — T82898A Other specified complication of vascular prosthetic devices, implants and grafts, initial encounter: Secondary | ICD-10-CM | POA: Diagnosis not present

## 2018-03-19 DIAGNOSIS — Z992 Dependence on renal dialysis: Secondary | ICD-10-CM | POA: Diagnosis not present

## 2018-03-19 DIAGNOSIS — D509 Iron deficiency anemia, unspecified: Secondary | ICD-10-CM | POA: Diagnosis not present

## 2018-03-19 DIAGNOSIS — D631 Anemia in chronic kidney disease: Secondary | ICD-10-CM | POA: Diagnosis not present

## 2018-03-19 DIAGNOSIS — N2581 Secondary hyperparathyroidism of renal origin: Secondary | ICD-10-CM | POA: Diagnosis not present

## 2018-03-21 DIAGNOSIS — Z992 Dependence on renal dialysis: Secondary | ICD-10-CM | POA: Diagnosis not present

## 2018-03-21 DIAGNOSIS — D509 Iron deficiency anemia, unspecified: Secondary | ICD-10-CM | POA: Diagnosis not present

## 2018-03-21 DIAGNOSIS — T82898A Other specified complication of vascular prosthetic devices, implants and grafts, initial encounter: Secondary | ICD-10-CM | POA: Diagnosis not present

## 2018-03-21 DIAGNOSIS — N2581 Secondary hyperparathyroidism of renal origin: Secondary | ICD-10-CM | POA: Diagnosis not present

## 2018-03-21 DIAGNOSIS — N186 End stage renal disease: Secondary | ICD-10-CM | POA: Diagnosis not present

## 2018-03-21 DIAGNOSIS — D631 Anemia in chronic kidney disease: Secondary | ICD-10-CM | POA: Diagnosis not present

## 2018-03-23 DIAGNOSIS — I5022 Chronic systolic (congestive) heart failure: Secondary | ICD-10-CM | POA: Diagnosis not present

## 2018-03-23 DIAGNOSIS — I2510 Atherosclerotic heart disease of native coronary artery without angina pectoris: Secondary | ICD-10-CM | POA: Diagnosis not present

## 2018-03-23 DIAGNOSIS — N186 End stage renal disease: Secondary | ICD-10-CM | POA: Diagnosis not present

## 2018-03-23 DIAGNOSIS — I132 Hypertensive heart and chronic kidney disease with heart failure and with stage 5 chronic kidney disease, or end stage renal disease: Secondary | ICD-10-CM | POA: Diagnosis not present

## 2018-03-23 DIAGNOSIS — E1022 Type 1 diabetes mellitus with diabetic chronic kidney disease: Secondary | ICD-10-CM | POA: Diagnosis not present

## 2018-03-23 DIAGNOSIS — I480 Paroxysmal atrial fibrillation: Secondary | ICD-10-CM | POA: Diagnosis not present

## 2018-03-24 DIAGNOSIS — D631 Anemia in chronic kidney disease: Secondary | ICD-10-CM | POA: Diagnosis not present

## 2018-03-24 DIAGNOSIS — T82898A Other specified complication of vascular prosthetic devices, implants and grafts, initial encounter: Secondary | ICD-10-CM | POA: Diagnosis not present

## 2018-03-24 DIAGNOSIS — Z992 Dependence on renal dialysis: Secondary | ICD-10-CM | POA: Diagnosis not present

## 2018-03-24 DIAGNOSIS — N2581 Secondary hyperparathyroidism of renal origin: Secondary | ICD-10-CM | POA: Diagnosis not present

## 2018-03-24 DIAGNOSIS — N186 End stage renal disease: Secondary | ICD-10-CM | POA: Diagnosis not present

## 2018-03-24 DIAGNOSIS — D509 Iron deficiency anemia, unspecified: Secondary | ICD-10-CM | POA: Diagnosis not present

## 2018-03-26 DIAGNOSIS — T82898A Other specified complication of vascular prosthetic devices, implants and grafts, initial encounter: Secondary | ICD-10-CM | POA: Diagnosis not present

## 2018-03-26 DIAGNOSIS — D509 Iron deficiency anemia, unspecified: Secondary | ICD-10-CM | POA: Diagnosis not present

## 2018-03-26 DIAGNOSIS — D631 Anemia in chronic kidney disease: Secondary | ICD-10-CM | POA: Diagnosis not present

## 2018-03-26 DIAGNOSIS — N186 End stage renal disease: Secondary | ICD-10-CM | POA: Diagnosis not present

## 2018-03-26 DIAGNOSIS — N2581 Secondary hyperparathyroidism of renal origin: Secondary | ICD-10-CM | POA: Diagnosis not present

## 2018-03-26 DIAGNOSIS — Z992 Dependence on renal dialysis: Secondary | ICD-10-CM | POA: Diagnosis not present

## 2018-03-28 DIAGNOSIS — Z992 Dependence on renal dialysis: Secondary | ICD-10-CM | POA: Diagnosis not present

## 2018-03-28 DIAGNOSIS — N2581 Secondary hyperparathyroidism of renal origin: Secondary | ICD-10-CM | POA: Diagnosis not present

## 2018-03-28 DIAGNOSIS — D631 Anemia in chronic kidney disease: Secondary | ICD-10-CM | POA: Diagnosis not present

## 2018-03-28 DIAGNOSIS — D509 Iron deficiency anemia, unspecified: Secondary | ICD-10-CM | POA: Diagnosis not present

## 2018-03-28 DIAGNOSIS — T82898A Other specified complication of vascular prosthetic devices, implants and grafts, initial encounter: Secondary | ICD-10-CM | POA: Diagnosis not present

## 2018-03-28 DIAGNOSIS — N186 End stage renal disease: Secondary | ICD-10-CM | POA: Diagnosis not present

## 2018-03-31 DIAGNOSIS — I5022 Chronic systolic (congestive) heart failure: Secondary | ICD-10-CM | POA: Diagnosis not present

## 2018-03-31 DIAGNOSIS — I480 Paroxysmal atrial fibrillation: Secondary | ICD-10-CM | POA: Diagnosis not present

## 2018-03-31 DIAGNOSIS — T82898A Other specified complication of vascular prosthetic devices, implants and grafts, initial encounter: Secondary | ICD-10-CM | POA: Diagnosis not present

## 2018-03-31 DIAGNOSIS — D509 Iron deficiency anemia, unspecified: Secondary | ICD-10-CM | POA: Diagnosis not present

## 2018-03-31 DIAGNOSIS — D631 Anemia in chronic kidney disease: Secondary | ICD-10-CM | POA: Diagnosis not present

## 2018-03-31 DIAGNOSIS — E1022 Type 1 diabetes mellitus with diabetic chronic kidney disease: Secondary | ICD-10-CM | POA: Diagnosis not present

## 2018-03-31 DIAGNOSIS — Z992 Dependence on renal dialysis: Secondary | ICD-10-CM | POA: Diagnosis not present

## 2018-03-31 DIAGNOSIS — I2510 Atherosclerotic heart disease of native coronary artery without angina pectoris: Secondary | ICD-10-CM | POA: Diagnosis not present

## 2018-03-31 DIAGNOSIS — N2581 Secondary hyperparathyroidism of renal origin: Secondary | ICD-10-CM | POA: Diagnosis not present

## 2018-03-31 DIAGNOSIS — N186 End stage renal disease: Secondary | ICD-10-CM | POA: Diagnosis not present

## 2018-03-31 DIAGNOSIS — I132 Hypertensive heart and chronic kidney disease with heart failure and with stage 5 chronic kidney disease, or end stage renal disease: Secondary | ICD-10-CM | POA: Diagnosis not present

## 2018-04-01 ENCOUNTER — Other Ambulatory Visit
Admission: RE | Admit: 2018-04-01 | Discharge: 2018-04-01 | Disposition: A | Discharge status: Home / Self Care | Payer: Medicare Other | Source: Ambulatory Visit | Attending: Nephrology | Admitting: Nephrology

## 2018-04-01 DIAGNOSIS — N186 End stage renal disease: Secondary | ICD-10-CM | POA: Diagnosis not present

## 2018-04-01 LAB — PHOSPHORUS: PHOSPHORUS: 4.8 mg/dL — AB (ref 2.5–4.6)

## 2018-04-02 DIAGNOSIS — T82898A Other specified complication of vascular prosthetic devices, implants and grafts, initial encounter: Secondary | ICD-10-CM | POA: Diagnosis not present

## 2018-04-02 DIAGNOSIS — Z992 Dependence on renal dialysis: Secondary | ICD-10-CM | POA: Diagnosis not present

## 2018-04-02 DIAGNOSIS — D631 Anemia in chronic kidney disease: Secondary | ICD-10-CM | POA: Diagnosis not present

## 2018-04-02 DIAGNOSIS — D509 Iron deficiency anemia, unspecified: Secondary | ICD-10-CM | POA: Diagnosis not present

## 2018-04-02 DIAGNOSIS — N2581 Secondary hyperparathyroidism of renal origin: Secondary | ICD-10-CM | POA: Diagnosis not present

## 2018-04-02 DIAGNOSIS — N186 End stage renal disease: Secondary | ICD-10-CM | POA: Diagnosis not present

## 2018-04-03 ENCOUNTER — Ambulatory Visit (INDEPENDENT_AMBULATORY_CARE_PROVIDER_SITE_OTHER): Payer: Medicare Other

## 2018-04-03 ENCOUNTER — Other Ambulatory Visit (INDEPENDENT_AMBULATORY_CARE_PROVIDER_SITE_OTHER): Payer: Self-pay | Admitting: Nurse Practitioner

## 2018-04-03 ENCOUNTER — Encounter (INDEPENDENT_AMBULATORY_CARE_PROVIDER_SITE_OTHER): Payer: Self-pay | Admitting: Nurse Practitioner

## 2018-04-03 ENCOUNTER — Ambulatory Visit (INDEPENDENT_AMBULATORY_CARE_PROVIDER_SITE_OTHER): Payer: Medicare Other | Admitting: Nurse Practitioner

## 2018-04-03 VITALS — BP 161/85 | HR 84 | Resp 16 | Ht 64.0 in | Wt 159.0 lb

## 2018-04-03 DIAGNOSIS — I480 Paroxysmal atrial fibrillation: Secondary | ICD-10-CM | POA: Diagnosis not present

## 2018-04-03 DIAGNOSIS — N186 End stage renal disease: Secondary | ICD-10-CM | POA: Diagnosis not present

## 2018-04-03 DIAGNOSIS — I120 Hypertensive chronic kidney disease with stage 5 chronic kidney disease or end stage renal disease: Secondary | ICD-10-CM

## 2018-04-03 DIAGNOSIS — E785 Hyperlipidemia, unspecified: Secondary | ICD-10-CM

## 2018-04-03 DIAGNOSIS — I2510 Atherosclerotic heart disease of native coronary artery without angina pectoris: Secondary | ICD-10-CM | POA: Diagnosis not present

## 2018-04-03 DIAGNOSIS — I132 Hypertensive heart and chronic kidney disease with heart failure and with stage 5 chronic kidney disease, or end stage renal disease: Secondary | ICD-10-CM | POA: Diagnosis not present

## 2018-04-03 DIAGNOSIS — E1022 Type 1 diabetes mellitus with diabetic chronic kidney disease: Secondary | ICD-10-CM | POA: Diagnosis not present

## 2018-04-03 DIAGNOSIS — I5022 Chronic systolic (congestive) heart failure: Secondary | ICD-10-CM | POA: Diagnosis not present

## 2018-04-03 DIAGNOSIS — E118 Type 2 diabetes mellitus with unspecified complications: Secondary | ICD-10-CM | POA: Diagnosis not present

## 2018-04-03 DIAGNOSIS — I10 Essential (primary) hypertension: Secondary | ICD-10-CM

## 2018-04-03 DIAGNOSIS — E1122 Type 2 diabetes mellitus with diabetic chronic kidney disease: Secondary | ICD-10-CM

## 2018-04-04 DIAGNOSIS — T82898A Other specified complication of vascular prosthetic devices, implants and grafts, initial encounter: Secondary | ICD-10-CM | POA: Diagnosis not present

## 2018-04-04 DIAGNOSIS — D509 Iron deficiency anemia, unspecified: Secondary | ICD-10-CM | POA: Diagnosis not present

## 2018-04-04 DIAGNOSIS — D631 Anemia in chronic kidney disease: Secondary | ICD-10-CM | POA: Diagnosis not present

## 2018-04-04 DIAGNOSIS — N2581 Secondary hyperparathyroidism of renal origin: Secondary | ICD-10-CM | POA: Diagnosis not present

## 2018-04-04 DIAGNOSIS — Z992 Dependence on renal dialysis: Secondary | ICD-10-CM | POA: Diagnosis not present

## 2018-04-04 DIAGNOSIS — N186 End stage renal disease: Secondary | ICD-10-CM | POA: Diagnosis not present

## 2018-04-06 ENCOUNTER — Encounter (INDEPENDENT_AMBULATORY_CARE_PROVIDER_SITE_OTHER): Payer: Self-pay | Admitting: Nurse Practitioner

## 2018-04-06 NOTE — Progress Notes (Signed)
Subjective:    Patient ID: Theresa Rogers, female    DOB: [birth year], 67 y.o.   MRN: [id number] Chief Complaint  Patient presents with  . New Patient (Initial Visit)    ref Holley Raring for vein mapping    HPI  Theresa Rogers is a 67 y.o. female that is seen for evaluation for dialysis access.  The patient has recently begun hemodialysis with a right PermCath.  She has been on dialysis for approximately 2 weeks.  She states that her energy levels have not yet rebounded, and she still has not yet reached her dry weight.  The patient is right-handed.  She is referred to [place] today by Dr. Holley Raring for creation of permanent dialysis access.  The patient denies amaurosis fugax or recent TIA symptoms. There are no recent neurological changes noted. The patient denies claudication symptoms or rest pain symptoms. The patient denies history of DVT, PE or superficial thrombophlebitis. The patient denies recent episodes of angina or shortness of breath.   The patient underwent vein mapping of her bilateral upper extremities.  She has patent basilic and cephalic veins throughout.  The left cephalic has borderline sized veins in the distal upper arm and antecubital fossa for a brachiocephalic AV fistula  Past Medical History:  Diagnosis Date  . Anemia   . CAD (coronary artery disease)    a. s/p MI/syncope in 2009 s/p remote stenting; b. NSTEMI 06/2015, cardiac cath 06/2015 pLAD 70% FFR 0.88 not clinically sig, dLAD 90%, mLCx 80% s/p PCI/DES 0%, pRCA 70%, mRCA 80%, dRCA 80%; c. cath 05/2016 mLAD 95%, dLAD 90%, dLAD 90%, pRCA 70%, mRCA 90%, dRCA 80%  . Chronic systolic CHF (congestive heart failure) ([place])   . CKD (chronic kidney disease), stage III ([place])   . Diabetes mellitus without complication ([place])   . HLD (hyperlipidemia)   . HTN (hypertension)   . Ischemic cardiomyopathy    a. echo 06/2015: EF 30-35%, diffuse HK, nable to exclude RWMA, LV diastolic function parameters were normal, mild MR, left atrium  was mildly dilated, PASP 28 mm Hg, IVC was dilated c/w elevated CVP; b. echo 05/2016: EF 20%, severe global HK with AK in ant, anteroseptal, apical wall, GR1DD, mild MR, mod dilated LA, RV sys fxn mod reduced, PASP 53 mmHg, triv pericardial effusion, L pleural effusion  . PAF (paroxysmal atrial fibrillation) ([place])    a. new onset 06/2015; b. CHADS2VASc = 5 (CHF, HTN, age x 1, vascular dz, female); c. not on long term full dose anticoagulation in an effort to avoid triple therapy given her anemia     Past Surgical History:  Procedure Laterality Date  . CARDIAC CATHETERIZATION N/A 06/22/2015   Procedure: Coronary Angiogram;  Surgeon: Minna Merritts, MD;  Location: [place] [facility];  Service: Cardiovascular;  Laterality: N/A;  . CARDIAC CATHETERIZATION N/A 06/26/2015   Procedure: Coronary Stent Intervention;  Surgeon: Wellington Hampshire, MD;  Location: [place] [facility];  Service: Cardiovascular;  Laterality: N/A;  . CARDIAC CATHETERIZATION N/A 08/03/2015   Procedure: Coronary Stent Intervention;  Surgeon: Wellington Hampshire, MD;  Location: [place] [facility];  Service: Cardiovascular;  Laterality: N/A;  . CARDIAC CATHETERIZATION N/A 05/20/2016   Procedure: Right/Left Heart Cath and Coronary Angiography;  Surgeon: Wellington Hampshire, MD;  Location: [place] [facility];  Service: Cardiovascular;  Laterality: N/A;  . CORONARY ANGIOPLASTY WITH STENT PLACEMENT    . CORONARY ARTERY BYPASS GRAFT N/A 05/27/2016   Procedure: CORONARY ARTERY BYPASS GRAFTING (CABG) x3  using left internal mammary artery and left greater saphenous vein harvested endoscopically;  Surgeon: Ivin Poot, MD;  Location: [place];  Service: Open Heart Surgery;  Laterality: N/A;  . IR GENERIC HISTORICAL  06/04/2016   IR US GUIDE VASC ACCESS RIGHT 06/04/2016 Corrie Mckusick, DO [facility]  . IR GENERIC HISTORICAL  06/04/2016   IR FLUORO GUIDE CV LINE RIGHT 06/04/2016 Corrie Mckusick, DO [facility]  . TEE WITHOUT CARDIOVERSION N/A  05/27/2016   Procedure: TRANSESOPHAGEAL ECHOCARDIOGRAM (TEE);  Surgeon: Ivin Poot, MD;  Location: [place];  Service: Open Heart Surgery;  Laterality: N/A;    Social History   Socioeconomic History  . Marital status: Divorced    Spouse name: Not on file  . Number of children: Not on file  . Years of education: Not on file  . Highest education level: Not on file  Occupational History  . Not on file  Social Needs  . Financial resource strain: Not on file  . Food insecurity:    Worry: Not on file    Inability: Not on file  . Transportation needs:    Medical: Not on file    Non-medical: Not on file  Tobacco Use  . Smoking status: Never Smoker  . Smokeless tobacco: Never Used  Substance and Sexual Activity  . Alcohol use: No    Alcohol/week: 0.0 standard drinks  . Drug use: No  . Sexual activity: Not Currently  Lifestyle  . Physical activity:    Days per week: Not on file    Minutes per session: Not on file  . Stress: Not on file  Relationships  . Social connections:    Talks on phone: Not on file    Gets together: Not on file    Attends religious service: Not on file    Active member of club or organization: Not on file    Attends meetings of clubs or organizations: Not on file    Relationship status: Not on file  . Intimate partner violence:    Fear of current or ex partner: Not on file    Emotionally abused: Not on file    Physically abused: Not on file    Forced sexual activity: Not on file  Other Topics Concern  . Not on file  Social History Narrative  . Not on file    Family History  Problem Relation Age of Onset  . CAD Mother   . Alzheimer's disease Mother   . Prostate cancer Father   . Diabetes Brother   . Kidney failure Brother     Allergies  Allergen Reactions  . No Known Allergies      Review of Systems   Review of Systems: Negative Unless Checked Constitutional: [] Weight loss  [] Fever  [] Chills Cardiac: [] Chest pain   []  Atrial  Fibrillation  [] Palpitations   [] Shortness of breath when laying flat   [] Shortness of breath with exertion. Vascular:  [] Pain in legs with walking   [] Pain in legs with standing  [] History of DVT   [] Phlebitis   [x] Swelling in legs   [] Varicose veins   [] Non-healing ulcers Pulmonary:   [] Uses home oxygen   [] Productive cough   [] Hemoptysis   [] Wheeze  [] COPD   [] Asthma Neurologic:  [] Dizziness   [] Seizures   [] History of stroke   [] History of TIA  [] Aphasia   [] Vissual changes   [] Weakness or numbness in arm   [] Weakness or numbness in leg Musculoskeletal:   [] Joint swelling   []   Joint pain   [] Low back pain  []  History of Knee Replacement Hematologic:  [] Easy bruising  [] Easy bleeding   [] Hypercoagulable state   [] Anemic Gastrointestinal:  [] Diarrhea   [] Vomiting  [] Gastroesophageal reflux/heartburn   [] Difficulty swallowing. Genitourinary:  [x] Chronic kidney disease   [] Difficult urination  [] Anuric   [] Blood in urine Skin:  [] Rashes   [] Ulcers  Psychological:  [] History of anxiety   []  History of major depression  []  Memory Difficulties     Objective:   Physical Exam  BP (!) 161/85 (BP Location: Right Arm)   Pulse 84   Resp 16   Ht 5\' 4"  (1.626 m)   Wt 159 lb (72.1 kg)   BMI 27.29 kg/m   Gen: WD/WN, NAD Head: /AT, No temporalis wasting.  Ear/Nose/Throat: Hearing grossly intact, nares w/o erythema or drainage Eyes: PER, EOMI, sclera nonicteric.  Neck: Supple, no masses.  No JVD.  Pulmonary:  Good air movement, no use of accessory muscles.  Cardiac: RRR Vascular:  Vessel Right Left  Radial Palpable Palpable   Gastrointestinal: soft, non-distended. No guarding/no peritoneal signs.  Musculoskeletal: M/S 5/5 throughout.  No deformity or atrophy.  Neurologic: Pain and light touch intact in extremities.  Symmetrical.  Speech is fluent. Motor exam as listed above. Psychiatric: Judgment intact, Mood & affect appropriate for pt's clinical situation. Dermatologic: No Venous rashes. No  Ulcers Noted.  No changes consistent with cellulitis. Lymph : No Cervical lymphadenopathy, no lichenification or skin changes of chronic lymphedema.      Assessment & Plan:   1. ESRD (end stage renal disease) ([place]) The patient underwent vein mapping of her bilateral upper extremities.  She has patent basilic and cephalic veins throughout.  The left cephalic has borderline sized veins in the distal upper arm and antecubital fossa for a brachiocephalic AV fistula  Recommend:  At this time the patient does not have appropriate extremity access for dialysis  Patient should have a left brachiocephalic AV fistula created, with possible creation of a brachial axillary graft if veins were found not to be an adequate size.  The risks, benefits and alternative therapies were reviewed in detail with the patient.  All questions were answered.  The patient agrees to proceed with surgery.    2. Diabetes mellitus with complication ([place]) Continue hypoglycemic medications as already ordered, these medications have been reviewed and there are no changes at this time.  Hgb A1C to be monitored as already arranged by primary service   3. Essential hypertension Continue antihypertensive medications as already ordered, these medications have been reviewed and there are no changes at this time.   4. Hyperlipidemia, unspecified hyperlipidemia type Continue statin as ordered and reviewed, no changes at this time    Current Outpatient Medications on File Prior to Visit  Medication Sig Dispense Refill  . aspirin EC 81 MG tablet Take 81 mg by mouth daily.    Marland Kitchen atorvastatin (LIPITOR) 40 MG tablet Take 1 tablet (40 mg total) by mouth daily at 6 PM. 30 tablet 0  . insulin glargine (LANTUS) 100 UNIT/ML injection Inject 12 Units into the skin at bedtime.    . insulin lispro (HUMALOG) 100 UNIT/ML injection Inject 5-12 Units into the skin 3 (three) times daily before meals. Per sliding scale    . multivitamin  (RENA-VIT) TABS tablet TK 1 T PO D  12  . amiodarone (PACERONE) 200 MG tablet TAKE 1 TABLET BY MOUTH DAILY (Patient not taking: Reported on 04/03/2018) 30 tablet 3  .  carvedilol (COREG) 3.125 MG tablet Take 1 tablet (3.125 mg total) by mouth 2 (two) times daily. 60 tablet 3  . ferrous sulfate 325 (65 FE) MG EC tablet Take 325 mg by mouth daily.    . hydrALAZINE (APRESOLINE) 25 MG tablet Take 25 mg by mouth every 12 (twelve) hours.    . torsemide (DEMADEX) 20 MG tablet Take 40 mg by mouth daily.    Marland Kitchen torsemide (DEMADEX) 20 MG tablet TAKE 2 TABLETS BY MOUTH TWICE DAILY (Patient not taking: Reported on 04/03/2018) 360 tablet 0  . warfarin (COUMADIN) 1 MG tablet Take as directed by [facility] (Patient not taking: Reported on 04/03/2018) 45 tablet 3   No current facility-administered medications on file prior to visit.     There are no Patient Instructions on file for this visit. No follow-ups on file.   Kris Hartmann, NP  This note was completed with Sales executive.  Any errors are purely unintentional.

## 2018-04-07 DIAGNOSIS — D631 Anemia in chronic kidney disease: Secondary | ICD-10-CM | POA: Diagnosis not present

## 2018-04-07 DIAGNOSIS — N186 End stage renal disease: Secondary | ICD-10-CM | POA: Diagnosis not present

## 2018-04-07 DIAGNOSIS — D509 Iron deficiency anemia, unspecified: Secondary | ICD-10-CM | POA: Diagnosis not present

## 2018-04-07 DIAGNOSIS — T82898A Other specified complication of vascular prosthetic devices, implants and grafts, initial encounter: Secondary | ICD-10-CM | POA: Diagnosis not present

## 2018-04-07 DIAGNOSIS — N2581 Secondary hyperparathyroidism of renal origin: Secondary | ICD-10-CM | POA: Diagnosis not present

## 2018-04-07 DIAGNOSIS — Z992 Dependence on renal dialysis: Secondary | ICD-10-CM | POA: Diagnosis not present

## 2018-04-09 DIAGNOSIS — N2581 Secondary hyperparathyroidism of renal origin: Secondary | ICD-10-CM | POA: Diagnosis not present

## 2018-04-09 DIAGNOSIS — Z992 Dependence on renal dialysis: Secondary | ICD-10-CM | POA: Diagnosis not present

## 2018-04-09 DIAGNOSIS — D509 Iron deficiency anemia, unspecified: Secondary | ICD-10-CM | POA: Diagnosis not present

## 2018-04-09 DIAGNOSIS — N186 End stage renal disease: Secondary | ICD-10-CM | POA: Diagnosis not present

## 2018-04-09 DIAGNOSIS — T82898A Other specified complication of vascular prosthetic devices, implants and grafts, initial encounter: Secondary | ICD-10-CM | POA: Diagnosis not present

## 2018-04-09 DIAGNOSIS — D631 Anemia in chronic kidney disease: Secondary | ICD-10-CM | POA: Diagnosis not present

## 2018-04-11 DIAGNOSIS — Z992 Dependence on renal dialysis: Secondary | ICD-10-CM | POA: Diagnosis not present

## 2018-04-11 DIAGNOSIS — Z23 Encounter for immunization: Secondary | ICD-10-CM | POA: Diagnosis not present

## 2018-04-11 DIAGNOSIS — D509 Iron deficiency anemia, unspecified: Secondary | ICD-10-CM | POA: Diagnosis not present

## 2018-04-11 DIAGNOSIS — N186 End stage renal disease: Secondary | ICD-10-CM | POA: Diagnosis not present

## 2018-04-11 DIAGNOSIS — D631 Anemia in chronic kidney disease: Secondary | ICD-10-CM | POA: Diagnosis not present

## 2018-04-13 ENCOUNTER — Telehealth: Payer: Self-pay | Admitting: Cardiovascular Disease

## 2018-04-13 DIAGNOSIS — N186 End stage renal disease: Secondary | ICD-10-CM | POA: Diagnosis not present

## 2018-04-13 DIAGNOSIS — Z992 Dependence on renal dialysis: Secondary | ICD-10-CM | POA: Diagnosis not present

## 2018-04-13 DIAGNOSIS — D509 Iron deficiency anemia, unspecified: Secondary | ICD-10-CM | POA: Diagnosis not present

## 2018-04-13 DIAGNOSIS — D631 Anemia in chronic kidney disease: Secondary | ICD-10-CM | POA: Diagnosis not present

## 2018-04-13 DIAGNOSIS — Z23 Encounter for immunization: Secondary | ICD-10-CM | POA: Diagnosis not present

## 2018-04-13 NOTE — Telephone Encounter (Signed)
    [facility] Pre-operative Risk Assessment    Request for surgical clearance:  1. What type of surgery is being performed? Left brachiocephlic AVF  2. When is this surgery scheduled? Not listed, Pending  3. What type of clearance is required (medical clearance vs. Pharmacy clearance to hold med vs. Both)? Not listed  4. Are there any medications that need to be held prior to surgery and how long? Not listed  5. Practice name and name of physician performing surgery? [facility]   6. What is your office phone number [phone]    7.   What is your office fax number [phone]  8.   Anesthesia type (None, local, MAC, general) ?  Not listed   Theresa Rogers 04/13/2018, 11:58 AM  _________________________________________________________________   (provider comments below)

## 2018-04-13 NOTE — Telephone Encounter (Signed)
Left message with patient to see if she is being follow by [facility] at this time. We haven't seen her with our service since 2018. Patient recently had Left heart cath at [facility].

## 2018-04-14 NOTE — Telephone Encounter (Signed)
No answer. Left message with patient to call back to let us know who she is seeing for her cardiology needs at this time.  Left message with [facility] to call back to discuss clearance as well to figure out who Primary Card is at this time.

## 2018-04-15 ENCOUNTER — Telehealth (INDEPENDENT_AMBULATORY_CARE_PROVIDER_SITE_OTHER): Payer: Self-pay

## 2018-04-15 DIAGNOSIS — D509 Iron deficiency anemia, unspecified: Secondary | ICD-10-CM | POA: Diagnosis not present

## 2018-04-15 DIAGNOSIS — D631 Anemia in chronic kidney disease: Secondary | ICD-10-CM | POA: Diagnosis not present

## 2018-04-15 DIAGNOSIS — N186 End stage renal disease: Secondary | ICD-10-CM | POA: Diagnosis not present

## 2018-04-15 DIAGNOSIS — Z23 Encounter for immunization: Secondary | ICD-10-CM | POA: Diagnosis not present

## 2018-04-15 DIAGNOSIS — Z992 Dependence on renal dialysis: Secondary | ICD-10-CM | POA: Diagnosis not present

## 2018-04-15 NOTE — Telephone Encounter (Signed)
Left voicemail message for patient to call back regarding clearance and which provider she is now seeing. Last visit here in our office was back on 08/11/2015 with Dr. Fletcher Anon. Unable to reach patient and will send message back to surgeon letting them know that we have not been able to reach the patient.

## 2018-04-15 NOTE — Telephone Encounter (Signed)
Thank you, we have been unable to reach Theresa Rogers as well and will attempt to continue to try to follow up.

## 2018-04-15 NOTE — Telephone Encounter (Signed)
I attempted to contact the patient to find out where she goes for cardiac clearance and had to leave a message for a return call.

## 2018-04-16 NOTE — Telephone Encounter (Signed)
I attempted to contact the patient's daughter Jenetta Downer, I left a message asking that she have the patient to contact our office.

## 2018-04-17 DIAGNOSIS — N186 End stage renal disease: Secondary | ICD-10-CM | POA: Diagnosis not present

## 2018-04-17 DIAGNOSIS — Z23 Encounter for immunization: Secondary | ICD-10-CM | POA: Diagnosis not present

## 2018-04-17 DIAGNOSIS — D631 Anemia in chronic kidney disease: Secondary | ICD-10-CM | POA: Diagnosis not present

## 2018-04-17 DIAGNOSIS — D509 Iron deficiency anemia, unspecified: Secondary | ICD-10-CM | POA: Diagnosis not present

## 2018-04-17 DIAGNOSIS — Z992 Dependence on renal dialysis: Secondary | ICD-10-CM | POA: Diagnosis not present

## 2018-04-17 NOTE — Telephone Encounter (Signed)
Patient called back to schedule appt with Dr. Fletcher Anon for surgical clearance.  Declined to return to [facility] as she thinks this may the reason she is in this position now.    Scheduled with berge

## 2018-04-20 DIAGNOSIS — Z23 Encounter for immunization: Secondary | ICD-10-CM | POA: Diagnosis not present

## 2018-04-20 DIAGNOSIS — D631 Anemia in chronic kidney disease: Secondary | ICD-10-CM | POA: Diagnosis not present

## 2018-04-20 DIAGNOSIS — Z992 Dependence on renal dialysis: Secondary | ICD-10-CM | POA: Diagnosis not present

## 2018-04-20 DIAGNOSIS — N186 End stage renal disease: Secondary | ICD-10-CM | POA: Diagnosis not present

## 2018-04-20 DIAGNOSIS — D509 Iron deficiency anemia, unspecified: Secondary | ICD-10-CM | POA: Diagnosis not present

## 2018-04-21 NOTE — Telephone Encounter (Signed)
Appt 05/12/18 with Ignacia Bayley, NP.

## 2018-04-22 DIAGNOSIS — N186 End stage renal disease: Secondary | ICD-10-CM | POA: Diagnosis not present

## 2018-04-22 DIAGNOSIS — Z23 Encounter for immunization: Secondary | ICD-10-CM | POA: Diagnosis not present

## 2018-04-22 DIAGNOSIS — D509 Iron deficiency anemia, unspecified: Secondary | ICD-10-CM | POA: Diagnosis not present

## 2018-04-22 DIAGNOSIS — Z992 Dependence on renal dialysis: Secondary | ICD-10-CM | POA: Diagnosis not present

## 2018-04-22 DIAGNOSIS — D631 Anemia in chronic kidney disease: Secondary | ICD-10-CM | POA: Diagnosis not present

## 2018-04-24 DIAGNOSIS — N186 End stage renal disease: Secondary | ICD-10-CM | POA: Diagnosis not present

## 2018-04-24 DIAGNOSIS — D509 Iron deficiency anemia, unspecified: Secondary | ICD-10-CM | POA: Diagnosis not present

## 2018-04-24 DIAGNOSIS — D631 Anemia in chronic kidney disease: Secondary | ICD-10-CM | POA: Diagnosis not present

## 2018-04-24 DIAGNOSIS — Z992 Dependence on renal dialysis: Secondary | ICD-10-CM | POA: Diagnosis not present

## 2018-04-24 DIAGNOSIS — Z23 Encounter for immunization: Secondary | ICD-10-CM | POA: Diagnosis not present

## 2018-04-27 DIAGNOSIS — N186 End stage renal disease: Secondary | ICD-10-CM | POA: Diagnosis not present

## 2018-04-27 DIAGNOSIS — D509 Iron deficiency anemia, unspecified: Secondary | ICD-10-CM | POA: Diagnosis not present

## 2018-04-27 DIAGNOSIS — Z23 Encounter for immunization: Secondary | ICD-10-CM | POA: Diagnosis not present

## 2018-04-27 DIAGNOSIS — D631 Anemia in chronic kidney disease: Secondary | ICD-10-CM | POA: Diagnosis not present

## 2018-04-27 DIAGNOSIS — Z992 Dependence on renal dialysis: Secondary | ICD-10-CM | POA: Diagnosis not present

## 2018-04-29 DIAGNOSIS — N186 End stage renal disease: Secondary | ICD-10-CM | POA: Diagnosis not present

## 2018-04-29 DIAGNOSIS — Z23 Encounter for immunization: Secondary | ICD-10-CM | POA: Diagnosis not present

## 2018-04-29 DIAGNOSIS — D509 Iron deficiency anemia, unspecified: Secondary | ICD-10-CM | POA: Diagnosis not present

## 2018-04-29 DIAGNOSIS — D631 Anemia in chronic kidney disease: Secondary | ICD-10-CM | POA: Diagnosis not present

## 2018-04-29 DIAGNOSIS — Z992 Dependence on renal dialysis: Secondary | ICD-10-CM | POA: Diagnosis not present

## 2018-05-01 DIAGNOSIS — Z23 Encounter for immunization: Secondary | ICD-10-CM | POA: Diagnosis not present

## 2018-05-01 DIAGNOSIS — D631 Anemia in chronic kidney disease: Secondary | ICD-10-CM | POA: Diagnosis not present

## 2018-05-01 DIAGNOSIS — N186 End stage renal disease: Secondary | ICD-10-CM | POA: Diagnosis not present

## 2018-05-01 DIAGNOSIS — D509 Iron deficiency anemia, unspecified: Secondary | ICD-10-CM | POA: Diagnosis not present

## 2018-05-01 DIAGNOSIS — Z992 Dependence on renal dialysis: Secondary | ICD-10-CM | POA: Diagnosis not present

## 2018-05-04 DIAGNOSIS — N186 End stage renal disease: Secondary | ICD-10-CM | POA: Diagnosis not present

## 2018-05-04 DIAGNOSIS — Z23 Encounter for immunization: Secondary | ICD-10-CM | POA: Diagnosis not present

## 2018-05-04 DIAGNOSIS — Z992 Dependence on renal dialysis: Secondary | ICD-10-CM | POA: Diagnosis not present

## 2018-05-04 DIAGNOSIS — D631 Anemia in chronic kidney disease: Secondary | ICD-10-CM | POA: Diagnosis not present

## 2018-05-04 DIAGNOSIS — D509 Iron deficiency anemia, unspecified: Secondary | ICD-10-CM | POA: Diagnosis not present

## 2018-05-06 DIAGNOSIS — D631 Anemia in chronic kidney disease: Secondary | ICD-10-CM | POA: Diagnosis not present

## 2018-05-06 DIAGNOSIS — D509 Iron deficiency anemia, unspecified: Secondary | ICD-10-CM | POA: Diagnosis not present

## 2018-05-06 DIAGNOSIS — Z992 Dependence on renal dialysis: Secondary | ICD-10-CM | POA: Diagnosis not present

## 2018-05-06 DIAGNOSIS — Z23 Encounter for immunization: Secondary | ICD-10-CM | POA: Diagnosis not present

## 2018-05-06 DIAGNOSIS — N186 End stage renal disease: Secondary | ICD-10-CM | POA: Diagnosis not present

## 2018-05-08 DIAGNOSIS — N186 End stage renal disease: Secondary | ICD-10-CM | POA: Diagnosis not present

## 2018-05-08 DIAGNOSIS — D631 Anemia in chronic kidney disease: Secondary | ICD-10-CM | POA: Diagnosis not present

## 2018-05-08 DIAGNOSIS — Z23 Encounter for immunization: Secondary | ICD-10-CM | POA: Diagnosis not present

## 2018-05-08 DIAGNOSIS — D509 Iron deficiency anemia, unspecified: Secondary | ICD-10-CM | POA: Diagnosis not present

## 2018-05-08 DIAGNOSIS — Z992 Dependence on renal dialysis: Secondary | ICD-10-CM | POA: Diagnosis not present

## 2018-05-09 DIAGNOSIS — Z992 Dependence on renal dialysis: Secondary | ICD-10-CM | POA: Diagnosis not present

## 2018-05-09 DIAGNOSIS — N186 End stage renal disease: Secondary | ICD-10-CM | POA: Diagnosis not present

## 2018-05-11 DIAGNOSIS — D631 Anemia in chronic kidney disease: Secondary | ICD-10-CM | POA: Diagnosis not present

## 2018-05-11 DIAGNOSIS — Z992 Dependence on renal dialysis: Secondary | ICD-10-CM | POA: Diagnosis not present

## 2018-05-11 DIAGNOSIS — D509 Iron deficiency anemia, unspecified: Secondary | ICD-10-CM | POA: Diagnosis not present

## 2018-05-11 DIAGNOSIS — Z23 Encounter for immunization: Secondary | ICD-10-CM | POA: Diagnosis not present

## 2018-05-11 DIAGNOSIS — N186 End stage renal disease: Secondary | ICD-10-CM | POA: Diagnosis not present

## 2018-05-11 DIAGNOSIS — N2581 Secondary hyperparathyroidism of renal origin: Secondary | ICD-10-CM | POA: Diagnosis not present

## 2018-05-12 ENCOUNTER — Encounter: Payer: Self-pay | Admitting: Nurse Practitioner

## 2018-05-12 ENCOUNTER — Ambulatory Visit (INDEPENDENT_AMBULATORY_CARE_PROVIDER_SITE_OTHER): Payer: Medicare Other | Admitting: Nurse Practitioner

## 2018-05-12 VITALS — BP 158/90 | HR 85 | Ht 64.0 in | Wt 160.5 lb

## 2018-05-12 DIAGNOSIS — I255 Ischemic cardiomyopathy: Secondary | ICD-10-CM | POA: Diagnosis not present

## 2018-05-12 DIAGNOSIS — I2510 Atherosclerotic heart disease of native coronary artery without angina pectoris: Secondary | ICD-10-CM

## 2018-05-12 DIAGNOSIS — I10 Essential (primary) hypertension: Secondary | ICD-10-CM

## 2018-05-12 DIAGNOSIS — E785 Hyperlipidemia, unspecified: Secondary | ICD-10-CM

## 2018-05-12 DIAGNOSIS — N186 End stage renal disease: Secondary | ICD-10-CM | POA: Diagnosis not present

## 2018-05-12 DIAGNOSIS — I5022 Chronic systolic (congestive) heart failure: Secondary | ICD-10-CM

## 2018-05-12 MED ORDER — CARVEDILOL 3.125 MG PO TABS: 3.1250 mg | ORAL_TABLET | Freq: Two times a day (BID) | ORAL | 3 refills | Status: DC

## 2018-05-12 NOTE — Patient Instructions (Signed)
Medication Instructions:  Your physician has recommended you make the following change in your medication:  1- TAKE Carvedilol 3.125 mg by mouth two times a day. Do not take morning dose the day of dialysis.  If you need a refill on your cardiac medications before your next appointment, please call your pharmacy.   Lab work: Your physician recommends that you return for lab work in: at your earliest convenience at dialysis to check liver function. Lab slip given.   If you have labs (blood work) drawn today and your tests are completely normal, you will receive your results only by: Marland Kitchen MyChart Message (if you have MyChart) OR . A paper copy in the mail If you have any lab test that is abnormal or we need to change your treatment, we will call you to review the results.  Testing/Procedures: none  Follow-Up: At [facility], you and your health needs are our priority.  As part of our continuing mission to provide you with exceptional heart care, we have created designated Provider Care Teams.  These Care Teams include your primary Cardiologist (physician) and Advanced Practice Providers (APPs -  Physician Assistants and Nurse Practitioners) who all work together to provide you with the care you need, when you need it. You will need a follow up appointment in 6-8 weeks.  Please call our office 2 months in advance to schedule this appointment.  You may see Kathlyn Sacramento, MD.

## 2018-05-12 NOTE — Progress Notes (Signed)
Office Visit    Patient Name: Theresa Rogers Date of Encounter: 05/12/2018  Primary Care Provider:  Marygrace Drought, MD Primary Cardiologist:  Kathlyn Sacramento, MD  Chief Complaint    67 year old female with a history of coronary artery disease status post three-vessel bypass, ischemic cardiomyopathy and chronic systolic congestive heart failure, type 2 diabetes mellitus, end-stage renal disease now on dialysis, hypertension, hyperlipidemia, and paroxysmal atrial fibrillation, who presents for follow-up after prolonged hospitalization at [facility] in September.  Past Medical History    Past Medical History:  Diagnosis Date  . Anemia   . CAD (coronary artery disease)    a. s/p MI/syncope in 2009 s/p remote stenting; b. NSTEMI/PCI: mLCx 80 (PCI/DES); c. 05/2016 Cath: Sev 3VD; d. 05/2016 CABG x 3: LIMA->LAD, VG->Diag, VG->dRCA; e. 02/2018 Cath [facility]): LM 20, LAD 180m, LCX patent stent mid, RCA 133m, LIMA->LAD ok, VG->Diag ok, VG->RPDA ok->Med Rx.  . Chronic systolic CHF (congestive heart failure) ([place])    a. 06/2015 Echo: EF 30-35%, diffuse HK, mild MR, mild LAE; b. 05/2016 Echo: EF 20%, sev global HK with AK in ant, anteroseptal, apical wall, GR1DD; c. 01/2018 Echo [facility]): EF 20-25%, gr2 DD, mild LVH, degen MV dzs, mild to mod LAE,dil RV, mild TR, mod PAH.  . Diabetes mellitus without complication ([place])   . ESRD (end stage renal disease) ([place])    a. HD since 02/2018 - MWF.  Marland Kitchen HLD (hyperlipidemia)   . HTN (hypertension)   . Ischemic cardiomyopathy    a. Echo 06/2015: EF 30-35%, diffuse HK; b. Echo 05/2016: EF 20%, severe global HK with AK in ant, anteroseptal, apical wall, Gr1 DD; c. 01/2018 Echo [facility]): EF 20-25%, Gr2 DD.  Marland Kitchen PAF (paroxysmal atrial fibrillation) ([place])    a. new onset 06/2015; b. CHADS2VASc = 5 (CHF, HTN, age x 1, vascular dz, female); c. No longer on warfarin in setting of rising INR and concern for hepatic failure 02/2018.   Past Surgical History:  Procedure Laterality Date  .  CARDIAC CATHETERIZATION N/A 06/22/2015   Procedure: Coronary Angiogram;  Surgeon: Minna Merritts, MD;  Location: [place] [facility];  Service: Cardiovascular;  Laterality: N/A;  . CARDIAC CATHETERIZATION N/A 06/26/2015   Procedure: Coronary Stent Intervention;  Surgeon: Wellington Hampshire, MD;  Location: [place] [facility];  Service: Cardiovascular;  Laterality: N/A;  . CARDIAC CATHETERIZATION N/A 08/03/2015   Procedure: Coronary Stent Intervention;  Surgeon: Wellington Hampshire, MD;  Location: [place] [facility];  Service: Cardiovascular;  Laterality: N/A;  . CARDIAC CATHETERIZATION N/A 05/20/2016   Procedure: Right/Left Heart Cath and Coronary Angiography;  Surgeon: Wellington Hampshire, MD;  Location: [place] [facility];  Service: Cardiovascular;  Laterality: N/A;  . CORONARY ANGIOPLASTY WITH STENT PLACEMENT    . CORONARY ARTERY BYPASS GRAFT N/A 05/27/2016   Procedure: CORONARY ARTERY BYPASS GRAFTING (CABG) x3 using left internal mammary artery and left greater saphenous vein harvested endoscopically;  Surgeon: Ivin Poot, MD;  Location: [place];  Service: Open Heart Surgery;  Laterality: N/A;  . IR GENERIC HISTORICAL  06/04/2016   IR US GUIDE VASC ACCESS RIGHT 06/04/2016 Corrie Mckusick, DO [facility]-INTERV RAD  . IR GENERIC HISTORICAL  06/04/2016   IR FLUORO GUIDE CV LINE RIGHT 06/04/2016 Corrie Mckusick, DO [facility]-INTER[facility]  . TEE WITHOUT CARDIOVERSION N/A 05/27/2016   Procedure: TRANSESOPHAGEAL ECHOCARDIOGRAM (TEE);  Surgeon: Ivin Poot, MD;  Location: [place];  Service: Open Heart Surgery;  Laterality: N/A;    Allergies  Allergies  Allergen Reactions  . No Known Allergies     History of Present Illness    67 year old female with the above complex past medical history including coronary artery disease status post prior MI and remote circumflex stenting with subsequent non-STEMI and CABG x3 in December 2017.  Other history includes ischemic cardiomyopathy and HFrEF with an EF of 20 to 25%,  paroxysmal atrial fibrillation which was diagnosed in January 2017, diabetes mellitus, hypertension, hyperlipidemia, anemia, and progressive kidney disease, now on dialysis.  She was previously seen by Dr. Haroldine Laws in the advanced heart failure clinic but has not been seen there since October 2018.    She says that she had been doing well throughout most of 2019 but in late August, she started experiencing an increase in lower extremity swelling.  She went to [facility] and was subsequently admitted to [facility].  Echocardiogram showed reduction in LVEF to 20%.  Troponins were normal.  Her creatinine on presentation was 2.19.  She underwent diagnostic cardiac catheterization which revealed native multivessel disease with 3 of 3 patent grafts.  She required diuresis and subsequently dobutamine infusion in the setting of cardiorenal syndrome.  Right heart catheterization was performed on and off dobutamine revealing moderately elevated right heart pressures with a wedge of 23 mmHg.  Output was 7.9 off of dobutamine and dobutamine was discontinued.  Unfortunately, with worsening renal failure she required placement of a dialysis catheter and hemodialysis was initiated.  Also during that admission, she had rising INR with mild liver dysfunction and abnormal LFTs.  Right upper quadrant ultrasound showed gallbladder distention due to biliary sludge.  It was felt that abnormal liver enzymes are secondary to cholestatic liver injury.  No intervention was felt to be required and LFTs were downtrending at discharge.  Since discharge, she has been undergoing dialysis 3 days a week in [place].  She has tolerated this reasonably well though notes that is a significant lifestyle change for her.  She does not feel as though enough volume is being removed and although she does not experience significant dyspnea, she feels as though her legs are heavy and she always has trace lower extremity swelling.  She denies chest pain,  palpitations, PND, orthopnea, dizziness, syncope, or early satiety.  She decided to reestablish care with our team in [place] as it is too far for her ride to [place] and she does not want to follow-up at [facility].  Home Medications    Prior to Admission medications   Medication Sig Start Date End Date Taking? Authorizing Provider  aspirin EC 81 MG tablet Take 81 mg by mouth daily.   Yes [provider]  hydrALAZINE (APRESOLINE) 25 MG tablet Take 25 mg by mouth every 12 (twelve) hours.   Yes [provider]  insulin glargine (LANTUS) 100 UNIT/ML injection Inject 12 Units into the skin at bedtime.   Yes [provider]  insulin lispro (HUMALOG) 100 UNIT/ML injection Inject 5-12 Units into the skin 3 (three) times daily before meals. Per sliding scale   Yes [provider]  multivitamin (RENA-VIT) TABS tablet TK 1 T PO D 03/24/18  Yes [provider]  carvedilol (COREG) 3.125 MG tablet Take 1 tablet (3.125 mg total) by mouth 2 (two) times daily. 03/25/17 06/23/17  Bensimhon, Shaune Pascal, MD    Review of Systems    Her legs often feel heavy in the setting of mild volume overload she denies chest pain, palpitations, PND, orthopnea, dizziness, syncope, or early satiety.  All  other systems reviewed and are otherwise negative except as noted above.  Physical Exam    VS:  BP (!) 158/90 (BP Location: Left Arm, Patient Position: Sitting, Cuff Size: Normal)   Pulse 85   Ht 5\' 4"  (1.626 m)   Wt 160 lb 8 oz (72.8 kg)   BMI 27.55 kg/m  , BMI Body mass index is 27.55 kg/m. GEN: Well nourished, well developed, in no acute distress. HEENT: normal. Neck: Supple, no JVD, carotid bruits, or masses. Cardiac: RRR, no murmurs, rubs, or gallops. No clubbing, cyanosis, trace bilateral ankle edema.  Radials/DP/PT 2+ and equal bilaterally.  Dialysis catheter to right upper chest. Respiratory:  Respirations regular and unlabored, clear to auscultation bilaterally. GI:  Soft, nontender, nondistended, BS + x 4. MS: no deformity or atrophy. Skin: warm and dry, no rash. Neuro:  Strength and sensation are intact. Psych: Normal affect.  Accessory Clinical Findings    ECG personally reviewed by me today -regular sinus rhythm, 85, left atrial enlargement, incomplete right bundle branch block, prior anterior and inferior infarcts - no acute changes.  Labs from October 3-[facility]  Sodium 131, potassium 3.9, chloride 96, CO2 20, BUN 105, creatinine 7.68, glucose 273, calcium 7.7 Albumin 3.6, total protein 6.5, total bilirubin 0.9, AST 26, ALT 78, alkaline phosphatase 938 Hemoglobin 8.9, hematocrit 28.9, WBC 4.2, platelets 199 Iron 56, TIBC 332.5  Assessment & Plan    1.  Ischemic cardiomyopathy/HFrEF: Patient was hospitalized at [facility] at the end of August and early September in the setting of volume overload with subsequent worsening of renal function following diagnostic catheterization.  She did require inotropic therapy briefly during hospitalization in the setting of cardiorenal syndrome.  She is now on hemodialysis 3 days a week.  Overall, volume has been okay though she notes that she feels as though her legs are often heavy and they have not gotten her to her dry weight yet.  Just yesterday, they took off more than on previous sessions and she is hopeful that they will continue to do this.  She has not been having any significant chest pain or dyspnea.  In the setting of worsening renal failure and hypertension when at [facility] in September, beta-blocker therapy was discontinued.  She has been on hydralazine but no nitrate.  I will add carvedilol 3.125 mg twice daily to her regimen and have recommended that she not take this on the morning of dialysis days.  We can potentially look to add long-acting nitrate therapy if carvedilol is tolerated.  Once we have optimize her medical therapy as best we can, we will need to consider referral to EP for [place] [facility].  2.  Essential  hypertension: Blood pressure elevated today.  It sounds as though it trends low in dialysis.  Adding carvedilol on nondialysis days.  Continue afterload reduction with hydralazine.  3.  Paroxysmal atrial fibrillation: In sinus today.  She denies palpitations.  Previous on amiodarone and warfarin.  Both were discontinued in the setting of LFT abnormality's rising INR during hospitalization.  She did have follow-up LFTs in October with primary care and her ALT remained elevated at that time at 4 with an alkaline phosphatase of 938.  CHA2DS2VASc equals 6.  Will defer reinitiation of warfarin at this time as INR very likely be labile going forward in the setting of end-stage renal disease requiring dialysis.  Her primary care provider has been following her LFTs closely and previously followed her INRs.  4.  Hyperlipidemia: Statin therapy also placed  on hold in the setting of LFT abnormalities.  She will need to continue to have follow-up of LFTs with consideration of reinitiation of statin in the future.    5.  Abnormal LFTs: As above, being followed by primary care.  Felt to be secondary to cholestatic liver disease.  6.  Type 2 diabetes mellitus: On insulin followed by primary care.  7.  End-stage renal disease: Tolerating Monday, Wednesday, Friday dialysis.  She is hopeful that they will drop her dry weight.  8.  Coronary artery disease: Status post catheterization in September showing stable anatomy with patent grafts.  She has not had chest pain.  She remains on aspirin.  I am adding back beta-blocker.  As above, not currently on statin in the setting of abnormal LFTs.    9.  Disposition: Overall stable.  She will follow-up in clinic in approximately 6 to 8 weeks or sooner if necessary.  She was given the option of being referred back to heart failure clinic in [place] but that is too far to drive for her.  Murray Hodgkins, NP 05/12/2018, 4:40 PM

## 2018-05-13 DIAGNOSIS — D509 Iron deficiency anemia, unspecified: Secondary | ICD-10-CM | POA: Diagnosis not present

## 2018-05-13 DIAGNOSIS — Z23 Encounter for immunization: Secondary | ICD-10-CM | POA: Diagnosis not present

## 2018-05-13 DIAGNOSIS — Z992 Dependence on renal dialysis: Secondary | ICD-10-CM | POA: Diagnosis not present

## 2018-05-13 DIAGNOSIS — N186 End stage renal disease: Secondary | ICD-10-CM | POA: Diagnosis not present

## 2018-05-13 DIAGNOSIS — D631 Anemia in chronic kidney disease: Secondary | ICD-10-CM | POA: Diagnosis not present

## 2018-05-13 DIAGNOSIS — N2581 Secondary hyperparathyroidism of renal origin: Secondary | ICD-10-CM | POA: Diagnosis not present

## 2018-05-15 DIAGNOSIS — N2581 Secondary hyperparathyroidism of renal origin: Secondary | ICD-10-CM | POA: Diagnosis not present

## 2018-05-15 DIAGNOSIS — Z992 Dependence on renal dialysis: Secondary | ICD-10-CM | POA: Diagnosis not present

## 2018-05-15 DIAGNOSIS — D631 Anemia in chronic kidney disease: Secondary | ICD-10-CM | POA: Diagnosis not present

## 2018-05-15 DIAGNOSIS — N186 End stage renal disease: Secondary | ICD-10-CM | POA: Diagnosis not present

## 2018-05-15 DIAGNOSIS — D509 Iron deficiency anemia, unspecified: Secondary | ICD-10-CM | POA: Diagnosis not present

## 2018-05-15 DIAGNOSIS — Z23 Encounter for immunization: Secondary | ICD-10-CM | POA: Diagnosis not present

## 2018-05-18 DIAGNOSIS — Z992 Dependence on renal dialysis: Secondary | ICD-10-CM | POA: Diagnosis not present

## 2018-05-18 DIAGNOSIS — Z1159 Encounter for screening for other viral diseases: Secondary | ICD-10-CM | POA: Diagnosis not present

## 2018-05-18 DIAGNOSIS — D631 Anemia in chronic kidney disease: Secondary | ICD-10-CM | POA: Diagnosis not present

## 2018-05-18 DIAGNOSIS — N2581 Secondary hyperparathyroidism of renal origin: Secondary | ICD-10-CM | POA: Diagnosis not present

## 2018-05-18 DIAGNOSIS — N186 End stage renal disease: Secondary | ICD-10-CM | POA: Diagnosis not present

## 2018-05-18 DIAGNOSIS — D509 Iron deficiency anemia, unspecified: Secondary | ICD-10-CM | POA: Diagnosis not present

## 2018-05-18 DIAGNOSIS — Z23 Encounter for immunization: Secondary | ICD-10-CM | POA: Diagnosis not present

## 2018-05-20 DIAGNOSIS — D631 Anemia in chronic kidney disease: Secondary | ICD-10-CM | POA: Diagnosis not present

## 2018-05-20 DIAGNOSIS — Z23 Encounter for immunization: Secondary | ICD-10-CM | POA: Diagnosis not present

## 2018-05-20 DIAGNOSIS — N186 End stage renal disease: Secondary | ICD-10-CM | POA: Diagnosis not present

## 2018-05-20 DIAGNOSIS — Z992 Dependence on renal dialysis: Secondary | ICD-10-CM | POA: Diagnosis not present

## 2018-05-20 DIAGNOSIS — N2581 Secondary hyperparathyroidism of renal origin: Secondary | ICD-10-CM | POA: Diagnosis not present

## 2018-05-20 DIAGNOSIS — D509 Iron deficiency anemia, unspecified: Secondary | ICD-10-CM | POA: Diagnosis not present

## 2018-05-22 DIAGNOSIS — Z992 Dependence on renal dialysis: Secondary | ICD-10-CM | POA: Diagnosis not present

## 2018-05-22 DIAGNOSIS — N186 End stage renal disease: Secondary | ICD-10-CM | POA: Diagnosis not present

## 2018-05-22 DIAGNOSIS — Z23 Encounter for immunization: Secondary | ICD-10-CM | POA: Diagnosis not present

## 2018-05-22 DIAGNOSIS — N2581 Secondary hyperparathyroidism of renal origin: Secondary | ICD-10-CM | POA: Diagnosis not present

## 2018-05-22 DIAGNOSIS — D631 Anemia in chronic kidney disease: Secondary | ICD-10-CM | POA: Diagnosis not present

## 2018-05-22 DIAGNOSIS — D509 Iron deficiency anemia, unspecified: Secondary | ICD-10-CM | POA: Diagnosis not present

## 2018-05-25 DIAGNOSIS — N186 End stage renal disease: Secondary | ICD-10-CM | POA: Diagnosis not present

## 2018-05-25 DIAGNOSIS — N2581 Secondary hyperparathyroidism of renal origin: Secondary | ICD-10-CM | POA: Diagnosis not present

## 2018-05-25 DIAGNOSIS — D509 Iron deficiency anemia, unspecified: Secondary | ICD-10-CM | POA: Diagnosis not present

## 2018-05-25 DIAGNOSIS — D631 Anemia in chronic kidney disease: Secondary | ICD-10-CM | POA: Diagnosis not present

## 2018-05-25 DIAGNOSIS — Z23 Encounter for immunization: Secondary | ICD-10-CM | POA: Diagnosis not present

## 2018-05-25 DIAGNOSIS — Z992 Dependence on renal dialysis: Secondary | ICD-10-CM | POA: Diagnosis not present

## 2018-05-27 DIAGNOSIS — Z23 Encounter for immunization: Secondary | ICD-10-CM | POA: Diagnosis not present

## 2018-05-27 DIAGNOSIS — Z992 Dependence on renal dialysis: Secondary | ICD-10-CM | POA: Diagnosis not present

## 2018-05-27 DIAGNOSIS — N2581 Secondary hyperparathyroidism of renal origin: Secondary | ICD-10-CM | POA: Diagnosis not present

## 2018-05-27 DIAGNOSIS — D509 Iron deficiency anemia, unspecified: Secondary | ICD-10-CM | POA: Diagnosis not present

## 2018-05-27 DIAGNOSIS — N186 End stage renal disease: Secondary | ICD-10-CM | POA: Diagnosis not present

## 2018-05-27 DIAGNOSIS — D631 Anemia in chronic kidney disease: Secondary | ICD-10-CM | POA: Diagnosis not present

## 2018-05-29 DIAGNOSIS — N186 End stage renal disease: Secondary | ICD-10-CM | POA: Diagnosis not present

## 2018-05-29 DIAGNOSIS — D509 Iron deficiency anemia, unspecified: Secondary | ICD-10-CM | POA: Diagnosis not present

## 2018-05-29 DIAGNOSIS — D631 Anemia in chronic kidney disease: Secondary | ICD-10-CM | POA: Diagnosis not present

## 2018-05-29 DIAGNOSIS — N2581 Secondary hyperparathyroidism of renal origin: Secondary | ICD-10-CM | POA: Diagnosis not present

## 2018-05-29 DIAGNOSIS — Z992 Dependence on renal dialysis: Secondary | ICD-10-CM | POA: Diagnosis not present

## 2018-05-29 DIAGNOSIS — Z23 Encounter for immunization: Secondary | ICD-10-CM | POA: Diagnosis not present

## 2018-06-01 DIAGNOSIS — N186 End stage renal disease: Secondary | ICD-10-CM | POA: Diagnosis not present

## 2018-06-01 DIAGNOSIS — D509 Iron deficiency anemia, unspecified: Secondary | ICD-10-CM | POA: Diagnosis not present

## 2018-06-01 DIAGNOSIS — Z992 Dependence on renal dialysis: Secondary | ICD-10-CM | POA: Diagnosis not present

## 2018-06-01 DIAGNOSIS — D631 Anemia in chronic kidney disease: Secondary | ICD-10-CM | POA: Diagnosis not present

## 2018-06-01 DIAGNOSIS — Z23 Encounter for immunization: Secondary | ICD-10-CM | POA: Diagnosis not present

## 2018-06-01 DIAGNOSIS — N2581 Secondary hyperparathyroidism of renal origin: Secondary | ICD-10-CM | POA: Diagnosis not present

## 2018-06-04 DIAGNOSIS — N2581 Secondary hyperparathyroidism of renal origin: Secondary | ICD-10-CM | POA: Diagnosis not present

## 2018-06-04 DIAGNOSIS — N186 End stage renal disease: Secondary | ICD-10-CM | POA: Diagnosis not present

## 2018-06-04 DIAGNOSIS — Z23 Encounter for immunization: Secondary | ICD-10-CM | POA: Diagnosis not present

## 2018-06-04 DIAGNOSIS — Z992 Dependence on renal dialysis: Secondary | ICD-10-CM | POA: Diagnosis not present

## 2018-06-04 DIAGNOSIS — D631 Anemia in chronic kidney disease: Secondary | ICD-10-CM | POA: Diagnosis not present

## 2018-06-04 DIAGNOSIS — D509 Iron deficiency anemia, unspecified: Secondary | ICD-10-CM | POA: Diagnosis not present

## 2018-06-05 DIAGNOSIS — N186 End stage renal disease: Secondary | ICD-10-CM | POA: Diagnosis not present

## 2018-06-05 DIAGNOSIS — D631 Anemia in chronic kidney disease: Secondary | ICD-10-CM | POA: Diagnosis not present

## 2018-06-05 DIAGNOSIS — N2581 Secondary hyperparathyroidism of renal origin: Secondary | ICD-10-CM | POA: Diagnosis not present

## 2018-06-05 DIAGNOSIS — D509 Iron deficiency anemia, unspecified: Secondary | ICD-10-CM | POA: Diagnosis not present

## 2018-06-05 DIAGNOSIS — Z992 Dependence on renal dialysis: Secondary | ICD-10-CM | POA: Diagnosis not present

## 2018-06-05 DIAGNOSIS — Z23 Encounter for immunization: Secondary | ICD-10-CM | POA: Diagnosis not present

## 2018-06-08 DIAGNOSIS — Z23 Encounter for immunization: Secondary | ICD-10-CM | POA: Diagnosis not present

## 2018-06-08 DIAGNOSIS — N2581 Secondary hyperparathyroidism of renal origin: Secondary | ICD-10-CM | POA: Diagnosis not present

## 2018-06-08 DIAGNOSIS — Z992 Dependence on renal dialysis: Secondary | ICD-10-CM | POA: Diagnosis not present

## 2018-06-08 DIAGNOSIS — N186 End stage renal disease: Secondary | ICD-10-CM | POA: Diagnosis not present

## 2018-06-08 DIAGNOSIS — D631 Anemia in chronic kidney disease: Secondary | ICD-10-CM | POA: Diagnosis not present

## 2018-06-08 DIAGNOSIS — D509 Iron deficiency anemia, unspecified: Secondary | ICD-10-CM | POA: Diagnosis not present

## 2018-06-09 DIAGNOSIS — Z992 Dependence on renal dialysis: Secondary | ICD-10-CM | POA: Diagnosis not present

## 2018-06-09 DIAGNOSIS — N186 End stage renal disease: Secondary | ICD-10-CM | POA: Diagnosis not present

## 2018-06-10 DIAGNOSIS — D509 Iron deficiency anemia, unspecified: Secondary | ICD-10-CM | POA: Diagnosis not present

## 2018-06-10 DIAGNOSIS — D631 Anemia in chronic kidney disease: Secondary | ICD-10-CM | POA: Diagnosis not present

## 2018-06-10 DIAGNOSIS — Z992 Dependence on renal dialysis: Secondary | ICD-10-CM | POA: Diagnosis not present

## 2018-06-10 DIAGNOSIS — N186 End stage renal disease: Secondary | ICD-10-CM | POA: Diagnosis not present

## 2018-06-12 DIAGNOSIS — N186 End stage renal disease: Secondary | ICD-10-CM | POA: Diagnosis not present

## 2018-06-12 DIAGNOSIS — D631 Anemia in chronic kidney disease: Secondary | ICD-10-CM | POA: Diagnosis not present

## 2018-06-12 DIAGNOSIS — E109 Type 1 diabetes mellitus without complications: Secondary | ICD-10-CM | POA: Diagnosis not present

## 2018-06-12 DIAGNOSIS — Z794 Long term (current) use of insulin: Secondary | ICD-10-CM | POA: Diagnosis not present

## 2018-06-12 DIAGNOSIS — D509 Iron deficiency anemia, unspecified: Secondary | ICD-10-CM | POA: Diagnosis not present

## 2018-06-12 DIAGNOSIS — Z992 Dependence on renal dialysis: Secondary | ICD-10-CM | POA: Diagnosis not present

## 2018-06-16 DIAGNOSIS — D509 Iron deficiency anemia, unspecified: Secondary | ICD-10-CM | POA: Diagnosis not present

## 2018-06-16 DIAGNOSIS — N186 End stage renal disease: Secondary | ICD-10-CM | POA: Diagnosis not present

## 2018-06-16 DIAGNOSIS — Z992 Dependence on renal dialysis: Secondary | ICD-10-CM | POA: Diagnosis not present

## 2018-06-16 DIAGNOSIS — D631 Anemia in chronic kidney disease: Secondary | ICD-10-CM | POA: Diagnosis not present

## 2018-06-17 DIAGNOSIS — D509 Iron deficiency anemia, unspecified: Secondary | ICD-10-CM | POA: Diagnosis not present

## 2018-06-17 DIAGNOSIS — N186 End stage renal disease: Secondary | ICD-10-CM | POA: Diagnosis not present

## 2018-06-17 DIAGNOSIS — Z992 Dependence on renal dialysis: Secondary | ICD-10-CM | POA: Diagnosis not present

## 2018-06-17 DIAGNOSIS — D631 Anemia in chronic kidney disease: Secondary | ICD-10-CM | POA: Diagnosis not present

## 2018-06-18 ENCOUNTER — Telehealth (INDEPENDENT_AMBULATORY_CARE_PROVIDER_SITE_OTHER): Payer: Self-pay

## 2018-06-18 NOTE — Telephone Encounter (Signed)
I have attempted to contact the patient to schedule her surgery but have had to leave a message for a return call. I have contacted the daughter and was unable to leave a message due to the mailbox being full.

## 2018-06-19 DIAGNOSIS — D631 Anemia in chronic kidney disease: Secondary | ICD-10-CM | POA: Diagnosis not present

## 2018-06-19 DIAGNOSIS — N186 End stage renal disease: Secondary | ICD-10-CM | POA: Diagnosis not present

## 2018-06-19 DIAGNOSIS — D509 Iron deficiency anemia, unspecified: Secondary | ICD-10-CM | POA: Diagnosis not present

## 2018-06-19 DIAGNOSIS — Z992 Dependence on renal dialysis: Secondary | ICD-10-CM | POA: Diagnosis not present

## 2018-06-22 DIAGNOSIS — D631 Anemia in chronic kidney disease: Secondary | ICD-10-CM | POA: Diagnosis not present

## 2018-06-22 DIAGNOSIS — Z992 Dependence on renal dialysis: Secondary | ICD-10-CM | POA: Diagnosis not present

## 2018-06-22 DIAGNOSIS — N186 End stage renal disease: Secondary | ICD-10-CM | POA: Diagnosis not present

## 2018-06-22 DIAGNOSIS — D509 Iron deficiency anemia, unspecified: Secondary | ICD-10-CM | POA: Diagnosis not present

## 2018-06-22 NOTE — Telephone Encounter (Signed)
I have attempted to contact the patient again and had to leave a message for a return call.

## 2018-06-24 ENCOUNTER — Encounter (INDEPENDENT_AMBULATORY_CARE_PROVIDER_SITE_OTHER): Payer: Self-pay

## 2018-06-24 DIAGNOSIS — D509 Iron deficiency anemia, unspecified: Secondary | ICD-10-CM | POA: Diagnosis not present

## 2018-06-24 DIAGNOSIS — Z992 Dependence on renal dialysis: Secondary | ICD-10-CM | POA: Diagnosis not present

## 2018-06-24 DIAGNOSIS — N186 End stage renal disease: Secondary | ICD-10-CM | POA: Diagnosis not present

## 2018-06-24 DIAGNOSIS — D631 Anemia in chronic kidney disease: Secondary | ICD-10-CM | POA: Diagnosis not present

## 2018-06-26 ENCOUNTER — Other Ambulatory Visit: Payer: Medicare Other

## 2018-06-26 DIAGNOSIS — N186 End stage renal disease: Secondary | ICD-10-CM | POA: Diagnosis not present

## 2018-06-26 DIAGNOSIS — D631 Anemia in chronic kidney disease: Secondary | ICD-10-CM | POA: Diagnosis not present

## 2018-06-26 DIAGNOSIS — Z992 Dependence on renal dialysis: Secondary | ICD-10-CM | POA: Diagnosis not present

## 2018-06-26 DIAGNOSIS — D509 Iron deficiency anemia, unspecified: Secondary | ICD-10-CM | POA: Diagnosis not present

## 2018-06-29 ENCOUNTER — Other Ambulatory Visit (INDEPENDENT_AMBULATORY_CARE_PROVIDER_SITE_OTHER): Payer: Self-pay | Admitting: Nurse Practitioner

## 2018-06-29 DIAGNOSIS — N186 End stage renal disease: Secondary | ICD-10-CM | POA: Diagnosis not present

## 2018-06-29 DIAGNOSIS — D509 Iron deficiency anemia, unspecified: Secondary | ICD-10-CM | POA: Diagnosis not present

## 2018-06-29 DIAGNOSIS — D631 Anemia in chronic kidney disease: Secondary | ICD-10-CM | POA: Diagnosis not present

## 2018-06-29 DIAGNOSIS — Z992 Dependence on renal dialysis: Secondary | ICD-10-CM | POA: Diagnosis not present

## 2018-06-30 ENCOUNTER — Other Ambulatory Visit: Payer: Self-pay

## 2018-06-30 ENCOUNTER — Encounter
Admission: RE | Admit: 2018-06-30 | Discharge: 2018-06-30 | Disposition: A | Discharge status: Home / Self Care | Payer: Medicare Other | Source: Ambulatory Visit | Attending: Vascular Surgery | Admitting: Vascular Surgery

## 2018-06-30 DIAGNOSIS — Z01812 Encounter for preprocedural laboratory examination: Secondary | ICD-10-CM | POA: Diagnosis not present

## 2018-06-30 HISTORY — DX: Other complications of anesthesia, initial encounter: T88.59XA

## 2018-06-30 HISTORY — DX: Adverse effect of unspecified anesthetic, initial encounter: T41.45XA

## 2018-06-30 LAB — PROTIME-INR
INR: 1.11
Prothrombin Time: 14.2 seconds (ref 11.4–15.2)

## 2018-06-30 LAB — CBC WITH DIFFERENTIAL/PLATELET
Abs Immature Granulocytes: 0.04 10*3/uL (ref 0.00–0.07)
Basophils Absolute: 0 10*3/uL (ref 0.0–0.1)
Basophils Relative: 0 %
EOS ABS: 0.1 10*3/uL (ref 0.0–0.5)
Eosinophils Relative: 1 %
HCT: 39.1 % (ref 36.0–46.0)
Hemoglobin: 12.4 g/dL (ref 12.0–15.0)
Immature Granulocytes: 0 %
Lymphocytes Relative: 5 %
Lymphs Abs: 0.5 10*3/uL — ABNORMAL LOW (ref 0.7–4.0)
MCH: 28.4 pg (ref 26.0–34.0)
MCHC: 31.7 g/dL (ref 30.0–36.0)
MCV: 89.7 fL (ref 80.0–100.0)
Monocytes Absolute: 0.9 10*3/uL (ref 0.1–1.0)
Monocytes Relative: 10 %
Neutro Abs: 8 10*3/uL — ABNORMAL HIGH (ref 1.7–7.7)
Neutrophils Relative %: 84 %
Platelets: 335 10*3/uL (ref 150–400)
RBC: 4.36 MIL/uL (ref 3.87–5.11)
RDW: 14.2 % (ref 11.5–15.5)
WBC: 9.5 10*3/uL (ref 4.0–10.5)
nRBC: 0 % (ref 0.0–0.2)

## 2018-06-30 LAB — TYPE AND SCREEN
ABO/RH(D): O POS
Antibody Screen: NEGATIVE

## 2018-06-30 LAB — BASIC METABOLIC PANEL
Anion gap: 11 (ref 5–15)
BUN: 35 mg/dL — ABNORMAL HIGH (ref 8–23)
CO2: 27 mmol/L (ref 22–32)
Calcium: 8.8 mg/dL — ABNORMAL LOW (ref 8.9–10.3)
Chloride: 93 mmol/L — ABNORMAL LOW (ref 98–111)
Creatinine, Ser: 2.56 mg/dL — ABNORMAL HIGH (ref 0.44–1.00)
GFR calc non Af Amer: 19 mL/min — ABNORMAL LOW (ref >60)
GFR, EST AFRICAN AMERICAN: 22 mL/min — AB (ref >60)
Glucose, Bld: 261 mg/dL — ABNORMAL HIGH (ref 70–99)
Potassium: 3.5 mmol/L (ref 3.5–5.1)
Sodium: 131 mmol/L — ABNORMAL LOW (ref 135–145)

## 2018-06-30 LAB — APTT: aPTT: 32 seconds (ref 24–36)

## 2018-06-30 NOTE — Patient Instructions (Signed)
Your procedure is scheduled on: Thursday 07/02/18.   Report to DAY SURGERY DEPARTMENT LOCATED ON 2ND FLOOR MEDICAL MALL ENTRANCE. To find out your arrival time please call [phone] between 1PM - 3PM on Wednesday 07/01/18.   Remember: Instructions that are not followed completely may result in serious medical risk, up to and including death, or upon the discretion of your surgeon and anesthesiologist your surgery may need to be rescheduled.      _X__ 1. Do not eat food after midnight the night before your procedure.                 No gum chewing or hard candies. You may drink water up to 2 hours                 before you are scheduled to arrive for your surgery- DO NOT drink clear                 liquids within 2 hours of the start of your surgery.                    __X__2.  On the morning of surgery brush your teeth with toothpaste and water, you may rinse your mouth with mouthwash if you wish.  Do not swallow any toothpaste or mouthwash.      _X__ 3.  No Alcohol for 24 hours before or after surgery.   __X__4.  Notify your doctor if there is any change in your medical condition      (cold, fever, infections).     Do not wear jewelry, make-up, hairpins, clips or nail polish. Do not wear lotions, powders, or perfumes.  Do not wear deodorant. Do not shave 48 hours prior to surgery. Men may shave face and neck. Do not bring valuables to the hospital.     [facility] is not responsible for any belongings or valuables.   Contacts, dentures/partials or body piercings may not be worn into surgery. Bring a case for your contacts, glasses or hearing aids, a denture cup will be supplied.   Patients discharged the day of surgery will not be allowed to drive home.    Please read over the following fact sheets that you were given:   MRSA Information   __X__ Take these medicines the morning of surgery with A SIP OF WATER:     1. carvedilol (COREG) 3.125 MG tablet  2.  hydrALAZINE (APRESOLINE) 25 MG tablet      __X__ Use CHG SAGE wipes as directed    __X__ Take 1/2 of usual insulin dose the night before surgery. No insulin the morning          of surgery.    __X__ Stop Blood Thinners: Aspirin until after your procedure.   __X__ Stop Anti-inflammatories 7 days before surgery such as Advil, Ibuprofen, Motrin, BC or Goodies Powder, Naprosyn, Naproxen, Aleve, Aspirin, Meloxicam. May take Tylenol if needed for pain or discomfort.    __X__ Don't begin taking any herbal supplements prior to your surgery.

## 2018-06-30 NOTE — Pre-Procedure Instructions (Signed)
BMP Blood glucose level 261 today. Eulogio Ditch notified. She advised notifying the PCP. Lab results faxed to Dr. Guadelupe Sabin. Fax confirmation received.

## 2018-07-01 DIAGNOSIS — D509 Iron deficiency anemia, unspecified: Secondary | ICD-10-CM | POA: Diagnosis not present

## 2018-07-01 DIAGNOSIS — D631 Anemia in chronic kidney disease: Secondary | ICD-10-CM | POA: Diagnosis not present

## 2018-07-01 DIAGNOSIS — N186 End stage renal disease: Secondary | ICD-10-CM | POA: Diagnosis not present

## 2018-07-01 DIAGNOSIS — Z992 Dependence on renal dialysis: Secondary | ICD-10-CM | POA: Diagnosis not present

## 2018-07-01 NOTE — Telephone Encounter (Signed)
Patient is on the schedule with Dr. Lucky Cowboy for a left AVF on 07/02/2018 and per the patient she has been rescheduled to 07/09/2018 due to not having transportation.

## 2018-07-03 DIAGNOSIS — Z992 Dependence on renal dialysis: Secondary | ICD-10-CM | POA: Diagnosis not present

## 2018-07-03 DIAGNOSIS — D509 Iron deficiency anemia, unspecified: Secondary | ICD-10-CM | POA: Diagnosis not present

## 2018-07-03 DIAGNOSIS — N186 End stage renal disease: Secondary | ICD-10-CM | POA: Diagnosis not present

## 2018-07-03 DIAGNOSIS — D631 Anemia in chronic kidney disease: Secondary | ICD-10-CM | POA: Diagnosis not present

## 2018-07-06 DIAGNOSIS — D509 Iron deficiency anemia, unspecified: Secondary | ICD-10-CM | POA: Diagnosis not present

## 2018-07-06 DIAGNOSIS — N186 End stage renal disease: Secondary | ICD-10-CM | POA: Diagnosis not present

## 2018-07-06 DIAGNOSIS — Z1159 Encounter for screening for other viral diseases: Secondary | ICD-10-CM | POA: Diagnosis not present

## 2018-07-06 DIAGNOSIS — Z992 Dependence on renal dialysis: Secondary | ICD-10-CM | POA: Diagnosis not present

## 2018-07-06 DIAGNOSIS — D631 Anemia in chronic kidney disease: Secondary | ICD-10-CM | POA: Diagnosis not present

## 2018-07-07 ENCOUNTER — Encounter: Payer: Self-pay | Admitting: Nurse Practitioner

## 2018-07-07 ENCOUNTER — Ambulatory Visit (INDEPENDENT_AMBULATORY_CARE_PROVIDER_SITE_OTHER): Payer: Medicare Other | Admitting: Nurse Practitioner

## 2018-07-07 VITALS — BP 154/88 | HR 87 | Ht 64.0 in | Wt 137.2 lb

## 2018-07-07 DIAGNOSIS — I480 Paroxysmal atrial fibrillation: Secondary | ICD-10-CM

## 2018-07-07 DIAGNOSIS — I255 Ischemic cardiomyopathy: Secondary | ICD-10-CM

## 2018-07-07 DIAGNOSIS — I10 Essential (primary) hypertension: Secondary | ICD-10-CM

## 2018-07-07 DIAGNOSIS — I5022 Chronic systolic (congestive) heart failure: Secondary | ICD-10-CM | POA: Diagnosis not present

## 2018-07-07 DIAGNOSIS — R7989 Other specified abnormal findings of blood chemistry: Secondary | ICD-10-CM

## 2018-07-07 DIAGNOSIS — E782 Mixed hyperlipidemia: Secondary | ICD-10-CM

## 2018-07-07 DIAGNOSIS — R945 Abnormal results of liver function studies: Secondary | ICD-10-CM

## 2018-07-07 DIAGNOSIS — I2510 Atherosclerotic heart disease of native coronary artery without angina pectoris: Secondary | ICD-10-CM | POA: Diagnosis not present

## 2018-07-07 DIAGNOSIS — N186 End stage renal disease: Secondary | ICD-10-CM

## 2018-07-07 DIAGNOSIS — M7989 Other specified soft tissue disorders: Secondary | ICD-10-CM

## 2018-07-07 MED ORDER — CARVEDILOL 6.25 MG PO TABS: 6.2500 mg | ORAL_TABLET | Freq: Two times a day (BID) | ORAL | 3 refills | Status: AC
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
## 2018-07-07 NOTE — Patient Instructions (Signed)
Medication Instructions:  Your physician has recommended you make the following change in your medication:  1- INCREASE Carvedilol to 1 tablet (6.25 mg) twice daily  If you need a refill on your cardiac medications before your next appointment, please call your pharmacy.   Lab work: None ordered  If you have labs (blood work) drawn today and your tests are completely normal, you will receive your results only by: Marland Kitchen MyChart Message (if you have MyChart) OR . A paper copy in the mail If you have any lab test that is abnormal or we need to change your treatment, we will call you to review the results.  Testing/Procedures: Right lower extremity venous ultrasound NEXT AVAILABLE APPT PLEASE Your physician has requested that you have a lower extremity venous duplex. This test is an ultrasound of the veins in the leg. It looks at venous blood flow that carries blood from the heart to the legs or arms. Allow one hour for a Lower Venous exam. There are no restrictions or special instructions.   Follow-Up: At [facility], you and your health needs are our priority.  As part of our continuing mission to provide you with exceptional heart care, we have created designated Provider Care Teams.  These Care Teams include your primary Cardiologist (physician) and Advanced Practice Providers (APPs -  Physician Assistants and Nurse Practitioners) who all work together to provide you with the care you need, when you need it. You will need a follow up appointment in 2 months. You may see Kathlyn Sacramento, MD or one of the following Advanced Practice Providers on your designated Care Team:   Murray Hodgkins, NP Christell Faith, PA-C . Marrianne Mood, PA-C

## 2018-07-08 ENCOUNTER — Telehealth: Payer: Self-pay | Admitting: *Deleted

## 2018-07-08 ENCOUNTER — Other Ambulatory Visit: Payer: Self-pay | Admitting: Nurse Practitioner

## 2018-07-08 ENCOUNTER — Ambulatory Visit (INDEPENDENT_AMBULATORY_CARE_PROVIDER_SITE_OTHER): Payer: Medicare Other

## 2018-07-08 DIAGNOSIS — M7989 Other specified soft tissue disorders: Secondary | ICD-10-CM

## 2018-07-08 DIAGNOSIS — M79661 Pain in right lower leg: Secondary | ICD-10-CM

## 2018-07-08 MED ORDER — CEFAZOLIN SODIUM-DEXTROSE 1-4 GM/50ML-% IV SOLN
1.0000 g | INTRAVENOUS | Status: AC
  Administered 2018-07-09: 1 g via INTRAVENOUS

## 2018-07-08 NOTE — Telephone Encounter (Signed)
Left a message for the patient to call back.  

## 2018-07-08 NOTE — Telephone Encounter (Signed)
-----   Message from Theora Gianotti, NP sent at 07/08/2018  4:49 PM EST ----- No DVT.  Good news.

## 2018-07-09 ENCOUNTER — Encounter
Admission: RE | Disposition: A | Discharge status: Home / Self Care | Payer: Self-pay | Source: Home / Self Care | Attending: Vascular Surgery

## 2018-07-09 ENCOUNTER — Encounter: Payer: Self-pay | Admitting: Emergency Medicine

## 2018-07-09 ENCOUNTER — Ambulatory Visit
Admission: RE | Admit: 2018-07-09 | Discharge: 2018-07-09 | Disposition: A | Discharge status: Home / Self Care | Payer: Medicare Other | Attending: Vascular Surgery | Admitting: Vascular Surgery

## 2018-07-09 ENCOUNTER — Ambulatory Visit: Payer: Medicare Other | Admitting: Registered Nurse

## 2018-07-09 ENCOUNTER — Other Ambulatory Visit: Payer: Self-pay

## 2018-07-09 DIAGNOSIS — N186 End stage renal disease: Secondary | ICD-10-CM | POA: Insufficient documentation

## 2018-07-09 DIAGNOSIS — Z955 Presence of coronary angioplasty implant and graft: Secondary | ICD-10-CM | POA: Insufficient documentation

## 2018-07-09 DIAGNOSIS — I132 Hypertensive heart and chronic kidney disease with heart failure and with stage 5 chronic kidney disease, or end stage renal disease: Secondary | ICD-10-CM | POA: Insufficient documentation

## 2018-07-09 DIAGNOSIS — Z992 Dependence on renal dialysis: Secondary | ICD-10-CM | POA: Insufficient documentation

## 2018-07-09 DIAGNOSIS — Z7982 Long term (current) use of aspirin: Secondary | ICD-10-CM | POA: Diagnosis not present

## 2018-07-09 DIAGNOSIS — Z79899 Other long term (current) drug therapy: Secondary | ICD-10-CM | POA: Insufficient documentation

## 2018-07-09 DIAGNOSIS — E1122 Type 2 diabetes mellitus with diabetic chronic kidney disease: Secondary | ICD-10-CM | POA: Insufficient documentation

## 2018-07-09 DIAGNOSIS — I5022 Chronic systolic (congestive) heart failure: Secondary | ICD-10-CM | POA: Diagnosis not present

## 2018-07-09 DIAGNOSIS — I252 Old myocardial infarction: Secondary | ICD-10-CM | POA: Diagnosis not present

## 2018-07-09 DIAGNOSIS — I480 Paroxysmal atrial fibrillation: Secondary | ICD-10-CM | POA: Diagnosis not present

## 2018-07-09 DIAGNOSIS — Z951 Presence of aortocoronary bypass graft: Secondary | ICD-10-CM | POA: Diagnosis not present

## 2018-07-09 DIAGNOSIS — Z794 Long term (current) use of insulin: Secondary | ICD-10-CM | POA: Insufficient documentation

## 2018-07-09 DIAGNOSIS — M79632 Pain in left forearm: Secondary | ICD-10-CM | POA: Diagnosis not present

## 2018-07-09 DIAGNOSIS — I2510 Atherosclerotic heart disease of native coronary artery without angina pectoris: Secondary | ICD-10-CM | POA: Insufficient documentation

## 2018-07-09 DIAGNOSIS — I255 Ischemic cardiomyopathy: Secondary | ICD-10-CM | POA: Diagnosis not present

## 2018-07-09 DIAGNOSIS — G8918 Other acute postprocedural pain: Secondary | ICD-10-CM | POA: Diagnosis not present

## 2018-07-09 DIAGNOSIS — E785 Hyperlipidemia, unspecified: Secondary | ICD-10-CM | POA: Diagnosis not present

## 2018-07-09 HISTORY — PX: AV FISTULA PLACEMENT: SHX1204

## 2018-07-09 LAB — GLUCOSE, CAPILLARY
Glucose-Capillary: 263 mg/dL — ABNORMAL HIGH (ref 70–99)
Glucose-Capillary: 259 mg/dL — ABNORMAL HIGH (ref 70–99)

## 2018-07-09 LAB — POCT I-STAT 4, (NA,K, GLUC, HGB,HCT)
Glucose, Bld: 280 mg/dL — ABNORMAL HIGH (ref 70–99)
HCT: 36 % (ref 36.0–46.0)
Hemoglobin: 12.2 g/dL (ref 12.0–15.0)
Potassium: 6.1 mmol/L — ABNORMAL HIGH (ref 3.5–5.1)
Sodium: 127 mmol/L — ABNORMAL LOW (ref 135–145)

## 2018-07-09 LAB — POTASSIUM: Potassium: 4.5 mmol/L (ref 3.5–5.1)

## 2018-07-09 SURGERY — ARTERIOVENOUS (AV) FISTULA CREATION
Anesthesia: General | Laterality: Left

## 2018-07-09 MED ORDER — CEFAZOLIN SODIUM-DEXTROSE 1-4 GM/50ML-% IV SOLN
INTRAVENOUS | Status: AC
  Filled 2018-07-09: qty 50

## 2018-07-09 MED ORDER — HEPARIN SODIUM (PORCINE) 1000 UNIT/ML IJ SOLN
INTRAMUSCULAR | Status: DC | PRN
  Administered 2018-07-09: 3000 [IU] via INTRAVENOUS

## 2018-07-09 MED ORDER — LIDOCAINE HCL (PF) 2 % IJ SOLN
INTRAMUSCULAR | Status: AC
  Filled 2018-07-09: qty 10

## 2018-07-09 MED ORDER — CHLORHEXIDINE GLUCONATE CLOTH 2 % EX PADS: 6.0000 | MEDICATED_PAD | Freq: Once | CUTANEOUS | Status: DC

## 2018-07-09 MED ORDER — FAMOTIDINE 20 MG PO TABS: 20.0000 mg | ORAL_TABLET | Freq: Once | ORAL | Status: DC

## 2018-07-09 MED ORDER — FENTANYL CITRATE (PF) 100 MCG/2ML IJ SOLN
50.0000 ug | Freq: Once | INTRAMUSCULAR | Status: AC
  Administered 2018-07-09: 50 ug via INTRAVENOUS

## 2018-07-09 MED ORDER — FENTANYL CITRATE (PF) 100 MCG/2ML IJ SOLN
INTRAMUSCULAR | Status: AC
  Administered 2018-07-09: 50 ug via INTRAVENOUS
  Filled 2018-07-09: qty 2

## 2018-07-09 MED ORDER — PROPOFOL 10 MG/ML IV BOLUS
INTRAVENOUS | Status: DC | PRN
  Administered 2018-07-09: 30 mg via INTRAVENOUS

## 2018-07-09 MED ORDER — HEPARIN SODIUM (PORCINE) 1000 UNIT/ML IJ SOLN
INTRAMUSCULAR | Status: AC
  Filled 2018-07-09: qty 1

## 2018-07-09 MED ORDER — LIDOCAINE HCL (CARDIAC) PF 100 MG/5ML IV SOSY
PREFILLED_SYRINGE | INTRAVENOUS | Status: DC | PRN
  Administered 2018-07-09: 50 mg via INTRAVENOUS

## 2018-07-09 MED ORDER — BACITRACIN ZINC 500 UNIT/GM EX OINT
TOPICAL_OINTMENT | CUTANEOUS | Status: DC | PRN
  Administered 2018-07-09: 1 via TOPICAL

## 2018-07-09 MED ORDER — PAPAVERINE HCL 30 MG/ML IJ SOLN
INTRAMUSCULAR | Status: AC
  Filled 2018-07-09: qty 2

## 2018-07-09 MED ORDER — LIDOCAINE HCL (PF) 1 % IJ SOLN
INTRAMUSCULAR | Status: DC | PRN
  Administered 2018-07-09: 1 mL via INTRADERMAL

## 2018-07-09 MED ORDER — PROPOFOL 500 MG/50ML IV EMUL
INTRAVENOUS | Status: DC | PRN
  Administered 2018-07-09: 40 ug/kg/min via INTRAVENOUS

## 2018-07-09 MED ORDER — HEPARIN SODIUM (PORCINE) 5000 UNIT/ML IJ SOLN
INTRAMUSCULAR | Status: AC
  Filled 2018-07-09: qty 1

## 2018-07-09 MED ORDER — MIDAZOLAM HCL 2 MG/2ML IJ SOLN
1.0000 mg | Freq: Once | INTRAMUSCULAR | Status: AC
  Administered 2018-07-09: 1 mg via INTRAVENOUS

## 2018-07-09 MED ORDER — LIDOCAINE HCL (PF) 2 % IJ SOLN
INTRAMUSCULAR | Status: DC | PRN
  Administered 2018-07-09: 3 mL via PERINEURAL
  Administered 2018-07-09: 3 mL
  Administered 2018-07-09: 4 mL via PERINEURAL

## 2018-07-09 MED ORDER — BUPIVACAINE HCL (PF) 0.5 % IJ SOLN
INTRAMUSCULAR | Status: DC | PRN
  Administered 2018-07-09: 7 mL
  Administered 2018-07-09: 11 mL via PERINEURAL
  Administered 2018-07-09: 12 mL via PERINEURAL

## 2018-07-09 MED ORDER — BUPIVACAINE HCL (PF) 0.5 % IJ SOLN
INTRAMUSCULAR | Status: AC
  Filled 2018-07-09: qty 30

## 2018-07-09 MED ORDER — MIDAZOLAM HCL 2 MG/2ML IJ SOLN
INTRAMUSCULAR | Status: AC
  Filled 2018-07-09: qty 2

## 2018-07-09 MED ORDER — PROPOFOL 10 MG/ML IV BOLUS
INTRAVENOUS | Status: AC
  Filled 2018-07-09: qty 20

## 2018-07-09 MED ORDER — FENTANYL CITRATE (PF) 100 MCG/2ML IJ SOLN
INTRAMUSCULAR | Status: AC
  Filled 2018-07-09: qty 2

## 2018-07-09 MED ORDER — HYDROCODONE-ACETAMINOPHEN 5-325 MG PO TABS: 1.0000 | ORAL_TABLET | Freq: Four times a day (QID) | ORAL | 0 refills | Status: DC | PRN

## 2018-07-09 MED ORDER — MIDAZOLAM HCL 2 MG/2ML IJ SOLN
INTRAMUSCULAR | Status: DC | PRN
  Administered 2018-07-09: 1 mg via INTRAVENOUS

## 2018-07-09 MED ORDER — LIDOCAINE HCL (PF) 1 % IJ SOLN
INTRAMUSCULAR | Status: AC
  Filled 2018-07-09: qty 5

## 2018-07-09 MED ORDER — SODIUM CHLORIDE 0.9 % IV SOLN
INTRAVENOUS | Status: DC
  Administered 2018-07-09: 14:00:00 via INTRAVENOUS

## 2018-07-09 MED ORDER — MIDAZOLAM HCL 2 MG/2ML IJ SOLN
INTRAMUSCULAR | Status: AC
  Administered 2018-07-09: 1 mg via INTRAVENOUS
  Filled 2018-07-09: qty 2

## 2018-07-09 SURGICAL SUPPLY — 52 items
BAG DECANTER FOR FLEXI CONT (MISCELLANEOUS) ×3 IMPLANT
BLADE SURG SZ11 CARB STEEL (BLADE) ×3 IMPLANT
BOOT SUTURE AID YELLOW STND (SUTURE) ×3 IMPLANT
BRUSH SCRUB EZ  4% CHG (MISCELLANEOUS) ×2
BRUSH SCRUB EZ 4% CHG (MISCELLANEOUS) ×1 IMPLANT
CANISTER SUCT 1200ML W/VALVE (MISCELLANEOUS) ×3 IMPLANT
CHLORAPREP W/TINT 26ML (MISCELLANEOUS) ×3 IMPLANT
CLIP SPRNG 6MM S-JAW DBL (CLIP) ×3
COVER WAND RF STERILE (DRAPES) ×3 IMPLANT
DERMABOND ADVANCED (GAUZE/BANDAGES/DRESSINGS) ×2
DERMABOND ADVANCED .7 DNX12 (GAUZE/BANDAGES/DRESSINGS) ×1 IMPLANT
ELECT CAUTERY BLADE 6.4 (BLADE) ×3 IMPLANT
ELECT REM PT RETURN 9FT ADLT (ELECTROSURGICAL) ×3
ELECTRODE REM PT RTRN 9FT ADLT (ELECTROSURGICAL) ×1 IMPLANT
GEL ULTRASOUND 20GR AQUASONIC (MISCELLANEOUS) IMPLANT
GLOVE BIO SURGEON STRL SZ7 (GLOVE) ×6 IMPLANT
GLOVE INDICATOR 7.5 STRL GRN (GLOVE) ×3 IMPLANT
GOWN STRL REUS W/ TWL LRG LVL3 (GOWN DISPOSABLE) ×1 IMPLANT
GOWN STRL REUS W/ TWL XL LVL3 (GOWN DISPOSABLE) ×2 IMPLANT
GOWN STRL REUS W/TWL LRG LVL3 (GOWN DISPOSABLE) ×2
GOWN STRL REUS W/TWL XL LVL3 (GOWN DISPOSABLE) ×4
HEMOSTAT SURGICEL 2X3 (HEMOSTASIS) ×3 IMPLANT
IV NS 500ML (IV SOLUTION) ×2
IV NS 500ML BAXH (IV SOLUTION) ×1 IMPLANT
KIT TURNOVER KIT A (KITS) ×3 IMPLANT
LABEL OR SOLS (LABEL) ×3 IMPLANT
LOOP RED MAXI  1X406MM (MISCELLANEOUS) ×2
LOOP VESSEL MAXI 1X406 RED (MISCELLANEOUS) ×1 IMPLANT
LOOP VESSEL MINI 0.8X406 BLUE (MISCELLANEOUS) ×1 IMPLANT
LOOPS BLUE MINI 0.8X406MM (MISCELLANEOUS) ×2
NEEDLE FILTER BLUNT 18X 1/2SAF (NEEDLE) ×2
NEEDLE FILTER BLUNT 18X1 1/2 (NEEDLE) ×1 IMPLANT
NEEDLE HYPO 30X.5 LL (NEEDLE) IMPLANT
NS IRRIG 500ML POUR BTL (IV SOLUTION) ×3 IMPLANT
PACK EXTREMITY ARMC (MISCELLANEOUS) ×3 IMPLANT
PAD PREP 24X41 OB/GYN DISP (PERSONAL CARE ITEMS) ×3 IMPLANT
SOLUTION CELL SAVER (CLIP) ×1 IMPLANT
STOCKINETTE STRL 4IN 9604848 (GAUZE/BANDAGES/DRESSINGS) ×3 IMPLANT
SUT MNCRL AB 4-0 PS2 18 (SUTURE) ×3 IMPLANT
SUT PROLENE 6 0 BV (SUTURE) ×12 IMPLANT
SUT SILK 2 0 (SUTURE) ×2
SUT SILK 2-0 18XBRD TIE 12 (SUTURE) ×1 IMPLANT
SUT SILK 3 0 (SUTURE) ×2
SUT SILK 3-0 18XBRD TIE 12 (SUTURE) ×1 IMPLANT
SUT SILK 4 0 (SUTURE) ×2
SUT SILK 4-0 18XBRD TIE 12 (SUTURE) ×1 IMPLANT
SUT VIC AB 3-0 SH 27 (SUTURE) ×4
SUT VIC AB 3-0 SH 27X BRD (SUTURE) ×2 IMPLANT
SYR 20CC LL (SYRINGE) ×3 IMPLANT
SYR 3ML LL SCALE MARK (SYRINGE) ×3 IMPLANT
SYR TB 1ML 27GX1/2 LL (SYRINGE) IMPLANT
TOWEL OR 17X26 4PK STRL BLUE (TOWEL DISPOSABLE) IMPLANT

## 2018-07-09 NOTE — Op Note (Signed)
[facility]   OPERATIVE NOTE   PROCEDURE: left brachiocephalic arteriovenous fistula placement  PRE-OPERATIVE DIAGNOSIS: 1.  ESRD        POST-OPERATIVE DIAGNOSIS: 1. ESRD       SURGEON: Leotis Pain, MD  ASSISTANT(S): Hezzie Bump, PA-C  ANESTHESIA: general  ESTIMATED BLOOD LOSS: 10 cc  FINDING(S): Adequate cephalic vein for fistula creation  SPECIMEN(S):  none  INDICATIONS:   Theresa Rogers is a 68 y.o. female who presents with renal failure in need of pemanent dialysis acces.  The patient is scheduled for left arm AVF placement.  The patient is aware the risks include but are not limited to: bleeding, infection, steal syndrome, nerve damage, ischemic monomelic neuropathy, failure to mature, and need for additional procedures.  The patient is aware of the risks of the procedure and elects to proceed forward. An assistant was present during the procedure to help facilitate the exposure and expedite the procedure.  DESCRIPTION: After full informed written consent was obtained from the patient, the patient was brought back to the operating room and placed supine upon the operating table.  Prior to induction, the patient received IV antibiotics.   After obtaining adequate anesthesia, the patient was then prepped and draped in the standard fashion for a left arm access procedure. The assistant provided retraction and mobilization to help facilitate exposure and expedite the procedure throughout the entire procedure.  This included following suture, using retractors, and optimizing lighting.  I made a curvilinear incision at the level of the antecubital fossa and dissected through the subcutaneous tissue and fascia to gain exposure of the brachial artery.  This was noted to be patent and adequate in size for fistula creation.  This was dissected out proximally and distally and prepared for control with vessel loops .  I then dissected out the cephalic vein.  This was  noted to be patent and adequate in size for fistula creation.  I then gave the patient 3000 units of intravenous heparin.  The vein was marked for orientation and the distal segment of the vein was ligated with a  2-0 silk, and the vein was transected.  I then instilled the heparinized saline into the vein and clamped it.  At this point, I reset my exposure of the brachial artery and pulled up control on the vessel loops.  I made an arteriotomy with a #11 blade, and then I extended the arteriotomy with a Potts scissor.  I injected heparinized saline proximal and distal to this arteriotomy.  The vein was then sewn to the artery in an end-to-side configuration with a running stitch of 6-0 Prolene.  Prior to completing this anastomosis, I allowed the vein and artery to backbleed.  There was no evidence of clot from any vessels.  I completed the anastomosis in the usual fashion and then released all vessel loops and clamps.  There was a palpable  thrill in the venous outflow, and there was a palpable pulse in the artery distal to the anastomosis.  At this point, I irrigated out the surgical wound.  Surgicel was placed. There was no further active bleeding.  The subcutaneous tissue was reapproximated with a running stitch of 3-0 Vicryl.  The skin was then closed with a 4-0 Monocryl suture.  The skin was then cleaned, dried, and reinforced with Dermabond.  The patient tolerated this procedure well and was taken to the recovery room in stable condition  COMPLICATIONS: None  CONDITION: Stable   Leotis Pain  07/09/2018, 3:23 PM  This note was created with Dragon Medical transcription system. Any errors in dictation are purely unintentional.

## 2018-07-09 NOTE — Anesthesia Procedure Notes (Signed)
Anesthesia Regional Block: Supraclavicular block   Pre-Anesthetic Checklist: ,, timeout performed, Correct Patient, Correct Site, Correct Laterality, Correct Procedure, Correct Position, site marked, Risks and benefits discussed,  Surgical consent,  Pre-op evaluation,  At surgeon's request and post-op pain management  Laterality: Upper and Left  Prep: chloraprep       Needles:  Injection technique: Single-shot  Needle Type: Stimiplex     Needle Length: 5cm  Needle Gauge: 22     Additional Needles:   Procedures:,,,, ultrasound used (permanent image in chart),,,,  Narrative:  Start time: 07/09/2018 1:17 PM End time: 07/09/2018 1:22 PM Injection made incrementally with aspirations every 5 mL.  Performed by: Personally  Anesthesiologist: Matas Burrows, Precious Haws, MD  Additional Notes: Patient consented for risk and benefits of nerve block including but not limited to nerve damage, failed block, bleeding and infection.  Patient voiced understanding.  Functioning IV was confirmed and monitors were applied.  A 46mm 22ga Stimuplex needle was used. Sterile prep,hand hygiene and sterile gloves were used.  Minimal sedation used for procedure.  No paresthesia endorsed by patient during the procedure.  Negative aspiration and negative test dose prior to incremental administration of local anesthetic. The patient tolerated the procedure well with no immediate complications.  Local placed in ring at intercostalbrachial nerve as well

## 2018-07-09 NOTE — H&P (Signed)
[facility] History & Physical Update  The patient was interviewed and re-examined.  The patient's previous History and Physical has been reviewed and is unchanged.  There is no change in the plan of care. We plan to proceed with the scheduled procedure.  Leotis Pain, MD  07/09/2018, 12:41 PM

## 2018-07-09 NOTE — Anesthesia Post-op Follow-up Note (Signed)
Anesthesia QCDR form completed.        
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
## 2018-07-09 NOTE — Anesthesia Postprocedure Evaluation (Signed)
Anesthesia Post Note  Patient: Theresa Rogers  Procedure(s) Performed: ARTERIOVENOUS (AV) FISTULA CREATION ( BRACHIOCEPHALIC) (Left )  Patient location during evaluation: PACU Anesthesia Type: General Level of consciousness: awake and alert Pain management: pain level controlled Vital Signs Assessment: post-procedure vital signs reviewed and stable Respiratory status: spontaneous breathing, nonlabored ventilation and respiratory function stable Cardiovascular status: blood pressure returned to baseline and stable Postop Assessment: no apparent nausea or vomiting Anesthetic complications: no     Last Vitals:  Vitals:   07/09/18 1619 07/09/18 1642  BP: (!) 137/55 (!) 146/73  Pulse: 83 81  Resp: 18 18  Temp: (!) 36.2 C   SpO2: 95% 97%    Last Pain:  Vitals:   07/09/18 1642  TempSrc:   PainSc: 0-No pain                 Durenda Hurt

## 2018-07-09 NOTE — Telephone Encounter (Signed)
Results called to pt. Pt verbalized understanding.  

## 2018-07-09 NOTE — Discharge Instructions (Signed)
Peripheral Nerve Block  Peripheral nerve block is an injection of numbing medicine (regional anesthetic) near a nerve. The regional anesthetic numbs everything below the injection site. This provides pain relief during and after a medical procedure. Generally, you will be awake while a peripheral nerve block is performed. You also may receive medicines to help you feel relaxed and comfortable during the procedure (sedatives). When you have a peripheral nerve block, you are not exposed to the risks associated with medicine that makes you fall asleep (general anesthetic). You may also:  Need less pain medicine after your procedure.  Have a lower risk of blood clots.  Recover sooner. Tell a health care provider about:  Any allergies you have.  All medicines you are taking, including vitamins, herbs, eye drops, creams, and over-the-counter medicines.  Any problems you or family members have had with anesthetic medicines.  Any blood disorders you have.  Any surgeries you have.  Any medical conditions you have.  Whether you are pregnant or may be pregnant. What are the risks? Generally, this is a safe procedure. However, problems may occur, including:  Infection.  Bleeding.  Allergic reactions to medicines.  Damage to other structures or organs, such as temporary or permanent nerve damage. What happens before the procedure? Staying hydrated Follow instructions from your health care provider about hydration, which may include:  Up to 2 hours before the procedure - you may continue to drink clear liquids, such as water, clear fruit juice, black coffee, and plain tea. Eating and drinking restrictions Follow instructions from your health care provider about eating and drinking, which may include:  8 hours before the procedure - stop eating heavy meals or foods such as meat, fried foods, or fatty foods.  6 hours before the procedure - stop eating light meals or foods,  such as toast or cereal.  6 hours before the procedure - stop drinking milk or drinks that contain milk.  2 hours before the procedure - stop drinking clear liquids. General instructions  Ask your health care provider about: ? Changing or stopping your regular medicines. This is especially important if you are taking diabetes medicines or blood thinners. ? Taking medicines such as aspirin and ibuprofen. These medicines can thin your blood. Do not take these medicines unless your health care provider tells you to take them. ? Taking over-the-counter medicines, vitamins, herbs, and supplements.  Plan to have someone take you home from the hospital or clinic.  Plan to have a responsible adult care for you for at least 24 hours after you leave the hospital or clinic. This is important. What happens during procedure?  An IV will be inserted into one of your veins.  You may be given a sedative.  Your nerve may be located by using: ? Sound waves that create images of the area (ultrasound). ? A device that activates the nerve and causes your muscles to twitch (nerve stimulator).  The skin around your injection site will be cleaned with a germ-killing solution.  Medicine to numb your injection site (local anesthetic) may be injected into the tissue above your nerve.  Regional anesthetic will be injected into the area near your nerve. ? The medicine will be injected around the nerve, not into it. ? You should not feel any pain. The area of your peripheral nerve block will begin to feel warm and numb.  A thin, flexible tube (catheter) may be inserted near your nerve. The catheter may remain there  to continue delivering the regional anesthetic during and after your medical procedure.  When the area of your peripheral nerve block is completely numb, the medical procedure can be performed.  Your injection site may be covered with a bandage (dressing) when your medical procedure is done. The  procedure may vary among health care providers and hospitals. What can I expect after the procedure?  If you do not have a catheter, you may continue to be numb for up to 24 hours. The length of this time depends on how much anesthetic was injected.  If you have a catheter, you will continue to be numb until the catheter is removed.  To reduce your risk of injury: ? Do not expose the numb area to heat or cold. ? Do not stand up or try to walk without help if you have a nerve block in one or both legs. Get help and limit your activity as told by your health care provider.  As the medicine wears off, you will have a gradual return of feeling in the area that is supplied by the nerve.  Your blood pressure, heart rate, breathing rate, and blood oxygen level will be monitored until the medicines you were given have worn off. Follow these instructions at home: Activity  Do not drive for 24 hours if you were given a sedative during your procedure.  Do not lift anything that is heavier than 10 lb (4.5 kg), or the limit that you are told, until your health care provider says that it is safe. Injection site care  If you have a dressing, remove it 24 hours after your procedure, or as told by your health care provider.  Check your injection site every day for signs of infection. Check for: ? Redness, swelling, or pain. ? Warmth. ? Fluid or blood. ? Pus or a bad smell. General instructions  Take over-the-counter and prescription medicines only as told by your health care provider.  Do not take showers or baths, swim, or use a hot tub until your health care provider approves.  Keep all follow-up visits as told by your health care provider. This is important. Contact a health care provider if:  You continue to have numbness, weakness, or tingling after your medicine has worn off.  You have a fever.  You have redness, swelling, or pain around your injection site. Summary  Peripheral  nerve block is an injection of numbing medicine (regional anesthetic) near a nerve. This provides pain relief during and after a medical procedure.  Feeling will gradually return to the area that is supplied by the nerve.  A catheter may be inserted to continue to provide medicine for up to several days. This information is not intended to replace advice given to you by your health care provider. Make sure you discuss any questions you have with your health care provider. Document Released: 09/03/2007 Document Revised: 03/19/2017 Document Reviewed: 03/19/2017 Elsevier Interactive Patient Education  2019 [employer] AV Fistula Placement, Care After This sheet gives you information about how to care for yourself after your procedure. Your health care provider may also give you more specific instructions. If you have problems or questions, contact your health care provider. What can I expect after the procedure? After the procedure, it is common to:  Feel sore.  Feel a vibration (thrill) over the fistula. Follow these instructions at home: Incision care      Follow instructions from your health care provider about how to take care of  your incision. Make sure you: ? Wash your hands with soap and water before and after you change your bandage (dressing). If soap and water are not available, use hand sanitizer. ? Change your dressing as told by your health care provider. ? Leave stitches (sutures), skin glue, or adhesive strips in place. These skin closures may need to stay in place for 2 weeks or longer. If adhesive strip edges start to loosen and curl up, you may trim the loose edges. Do not remove adhesive strips completely unless your health care provider tells you to do that. Fistula care  Check your fistula site every day to make sure the thrill feels the same.  Check your fistula site every day for signs of infection. Check for: ? Redness, swelling, or pain. ? Fluid or  blood. ? Warmth. ? Pus or a bad smell.  Raise (elevate) the affected area above the level of your heart while you are sitting or lying down.  Do not lift anything that is heavier than 10 lb (4.5 kg), or the limit that you are told, until your health care provider says that it is safe.  Do not lie down on your fistula arm.  Do not let anyone draw blood or take a blood pressure reading on your fistula arm. This is important.  Do not wear tight jewelry or clothing over your fistula arm. Bathing  Do not take baths, swim, or use a hot tub until your health care provider approves. Ask your health care provider if you may take showers. You may only be allowed to take sponge baths.  Keep the area around your incision clean and dry. Medicines  Take over-the-counter and prescription medicines only as told by your health care provider.  Ask your health care provider if any medicine prescribed to you can cause constipation. You may need to take steps to prevent or treat constipation, such as: ? Drink enough fluid to keep your urine pale yellow. ? Take over-the-counter or prescription medicines. ? Eat foods that are high in fiber, such as beans, whole grains, and fresh fruits and vegetables. ? Limit foods that are high in fat and processed sugars, such as fried or sweet foods. General instructions  Rest at home for a day or two.  Return to your normal activities as told by your health care provider. Ask your health care provider what activities are safe for you.  Keep all follow-up visits as told by your health care provider. This is important. Contact a health care provider if:  You have redness, swelling, or pain around your fistula site.  Your fistula site feels warm to the touch.  You have pus or a bad smell coming from your fistula site.  You have a fever.  You have numbness or coldness at your fistula site.  You feel a decrease or a change in the thrill. Get help right away if  you:  Are bleeding from your fistula site.  Have chest pain.  Have trouble breathing. Summary  Follow instructions from your health care provider about how to take care of your incision.  Do not let anyone draw blood or take a blood pressure reading on your fistula arm. This is important.  Return to your normal activities as told by your health care provider. Ask your health care provider what activities are safe for you.  Contact a health care provider if you have a change in the thrill or have any signs of infection at your fistula site.  Keep all follow-up visits as told by your health care provider. This is important. This information is not intended to replace advice given to you by your health care provider. Make sure you discuss any questions you have with your health care provider. Document Released: 05/27/2005 Document Revised: 12/01/2017 Document Reviewed: 12/01/2017 Elsevier Interactive Patient Education  2019 [place]   1) The drugs that you were given will stay in your system until tomorrow so for the next 24 hours you should not:  A) Drive an automobile B) Make any legal decisions C) Drink any alcoholic beverage   2) You may resume regular meals tomorrow.  Today it is better to start with liquids and gradually work up to solid foods.  You may eat anything you prefer, but it is better to start with liquids, then soup and crackers, and gradually work up to solid foods.   3) Please notify your doctor immediately if you have any unusual bleeding, trouble breathing, redness and pain at the surgery site, drainage, fever, or pain not relieved by medication.    4) Additional Instructions:        Please contact your physician with any problems or Same Day Surgery at [phone], Monday through Friday 6 am to 4 pm, or [place] at [facility] number at [phone].

## 2018-07-09 NOTE — Transfer of Care (Signed)
Immediate Anesthesia Transfer of Care Note  Patient: Theresa Rogers  Procedure(s) Performed: ARTERIOVENOUS (AV) FISTULA CREATION ( BRACHIOCEPHALIC) (Left )  Patient Location: PACU  Anesthesia Type:General  Level of Consciousness: sedated  Airway & Oxygen Therapy: Patient Spontanous Breathing and Patient connected to face mask oxygen  Post-op Assessment: Report given to RN and Post -op Vital signs reviewed and stable  Post vital signs: Reviewed and stable  Last Vitals:  Vitals Value Taken Time  BP 146/84 07/09/2018  3:17 PM  Temp    Pulse 71 07/09/2018  3:17 PM  Resp 9 07/09/2018  3:17 PM  SpO2 100 % 07/09/2018  3:17 PM  Vitals shown include unvalidated device data.  Last Pain:  Vitals:   07/09/18 1517  TempSrc:   PainSc: 0-No pain         Complications: No apparent anesthesia complications

## 2018-07-10 DIAGNOSIS — Z992 Dependence on renal dialysis: Secondary | ICD-10-CM | POA: Diagnosis not present

## 2018-07-10 DIAGNOSIS — N186 End stage renal disease: Secondary | ICD-10-CM | POA: Diagnosis not present

## 2018-07-13 DIAGNOSIS — Z992 Dependence on renal dialysis: Secondary | ICD-10-CM | POA: Diagnosis not present

## 2018-07-13 DIAGNOSIS — D509 Iron deficiency anemia, unspecified: Secondary | ICD-10-CM | POA: Diagnosis not present

## 2018-07-13 DIAGNOSIS — D631 Anemia in chronic kidney disease: Secondary | ICD-10-CM | POA: Diagnosis not present

## 2018-07-13 DIAGNOSIS — N186 End stage renal disease: Secondary | ICD-10-CM | POA: Diagnosis not present

## 2018-07-15 DIAGNOSIS — Z992 Dependence on renal dialysis: Secondary | ICD-10-CM | POA: Diagnosis not present

## 2018-07-15 DIAGNOSIS — N186 End stage renal disease: Secondary | ICD-10-CM | POA: Diagnosis not present

## 2018-07-15 DIAGNOSIS — D509 Iron deficiency anemia, unspecified: Secondary | ICD-10-CM | POA: Diagnosis not present

## 2018-07-15 DIAGNOSIS — D631 Anemia in chronic kidney disease: Secondary | ICD-10-CM | POA: Diagnosis not present

## 2018-07-17 DIAGNOSIS — D509 Iron deficiency anemia, unspecified: Secondary | ICD-10-CM | POA: Diagnosis not present

## 2018-07-17 DIAGNOSIS — N186 End stage renal disease: Secondary | ICD-10-CM | POA: Diagnosis not present

## 2018-07-17 DIAGNOSIS — Z992 Dependence on renal dialysis: Secondary | ICD-10-CM | POA: Diagnosis not present

## 2018-07-17 DIAGNOSIS — D631 Anemia in chronic kidney disease: Secondary | ICD-10-CM | POA: Diagnosis not present

## 2018-07-20 ENCOUNTER — Ambulatory Visit (INDEPENDENT_AMBULATORY_CARE_PROVIDER_SITE_OTHER): Payer: Medicare Other

## 2018-07-20 ENCOUNTER — Encounter (INDEPENDENT_AMBULATORY_CARE_PROVIDER_SITE_OTHER): Payer: Self-pay | Admitting: Nurse Practitioner

## 2018-07-20 ENCOUNTER — Other Ambulatory Visit (INDEPENDENT_AMBULATORY_CARE_PROVIDER_SITE_OTHER): Payer: Self-pay | Admitting: Nurse Practitioner

## 2018-07-20 ENCOUNTER — Ambulatory Visit (INDEPENDENT_AMBULATORY_CARE_PROVIDER_SITE_OTHER): Payer: Medicare Other | Admitting: Nurse Practitioner

## 2018-07-20 VITALS — BP 157/73 | HR 89 | Resp 16 | Ht 64.0 in | Wt 138.0 lb

## 2018-07-20 DIAGNOSIS — D509 Iron deficiency anemia, unspecified: Secondary | ICD-10-CM | POA: Diagnosis not present

## 2018-07-20 DIAGNOSIS — I10 Essential (primary) hypertension: Secondary | ICD-10-CM

## 2018-07-20 DIAGNOSIS — Z992 Dependence on renal dialysis: Secondary | ICD-10-CM | POA: Diagnosis not present

## 2018-07-20 DIAGNOSIS — M79642 Pain in left hand: Secondary | ICD-10-CM

## 2018-07-20 DIAGNOSIS — T82898A Other specified complication of vascular prosthetic devices, implants and grafts, initial encounter: Secondary | ICD-10-CM

## 2018-07-20 DIAGNOSIS — E118 Type 2 diabetes mellitus with unspecified complications: Secondary | ICD-10-CM

## 2018-07-20 DIAGNOSIS — E1122 Type 2 diabetes mellitus with diabetic chronic kidney disease: Secondary | ICD-10-CM

## 2018-07-20 DIAGNOSIS — N186 End stage renal disease: Secondary | ICD-10-CM

## 2018-07-20 DIAGNOSIS — I120 Hypertensive chronic kidney disease with stage 5 chronic kidney disease or end stage renal disease: Secondary | ICD-10-CM

## 2018-07-20 DIAGNOSIS — D631 Anemia in chronic kidney disease: Secondary | ICD-10-CM | POA: Diagnosis not present

## 2018-07-21 ENCOUNTER — Encounter (INDEPENDENT_AMBULATORY_CARE_PROVIDER_SITE_OTHER): Payer: Self-pay

## 2018-07-21 ENCOUNTER — Telehealth (INDEPENDENT_AMBULATORY_CARE_PROVIDER_SITE_OTHER): Payer: Self-pay

## 2018-07-21 NOTE — Telephone Encounter (Signed)
Attempted to contact the patient to schedule the angio. A message was left for a return call. This appt has been made and will be faxed to the patient's dialysis center as well as mailed out to the home.

## 2018-07-21 NOTE — Telephone Encounter (Signed)
Spoke with the patient and gave her an updated day/time for her procedure with Dr. Lucky Cowboy on 07/30/2018. Patient is to arrive at the [facility] at 10:15 am and all pre-procedure instructions were gone over. This information will be faxed to the patient's dialysis center as well.

## 2018-07-22 NOTE — Telephone Encounter (Signed)
When I spoke with the patient yesterday she wanted to make it clear that if the procedure does not help, she does not want any other procedures she wants the access removed.

## 2018-07-24 DIAGNOSIS — D631 Anemia in chronic kidney disease: Secondary | ICD-10-CM | POA: Diagnosis not present

## 2018-07-24 DIAGNOSIS — Z992 Dependence on renal dialysis: Secondary | ICD-10-CM | POA: Diagnosis not present

## 2018-07-24 DIAGNOSIS — D509 Iron deficiency anemia, unspecified: Secondary | ICD-10-CM | POA: Diagnosis not present

## 2018-07-24 DIAGNOSIS — N186 End stage renal disease: Secondary | ICD-10-CM | POA: Diagnosis not present

## 2018-07-27 ENCOUNTER — Other Ambulatory Visit (INDEPENDENT_AMBULATORY_CARE_PROVIDER_SITE_OTHER): Payer: Self-pay | Admitting: Vascular Surgery

## 2018-07-27 DIAGNOSIS — D631 Anemia in chronic kidney disease: Secondary | ICD-10-CM | POA: Diagnosis not present

## 2018-07-27 DIAGNOSIS — N186 End stage renal disease: Secondary | ICD-10-CM | POA: Diagnosis not present

## 2018-07-27 DIAGNOSIS — D509 Iron deficiency anemia, unspecified: Secondary | ICD-10-CM | POA: Diagnosis not present

## 2018-07-27 DIAGNOSIS — Z992 Dependence on renal dialysis: Secondary | ICD-10-CM | POA: Diagnosis not present

## 2018-07-28 ENCOUNTER — Encounter
Admission: EM | Disposition: A | Discharge status: Home / Self Care | Payer: Self-pay | Source: Home / Self Care | Attending: Emergency Medicine

## 2018-07-28 ENCOUNTER — Emergency Department
Admission: EM | Admit: 2018-07-28 | Discharge: 2018-07-28 | Disposition: A | Discharge status: Home / Self Care | Payer: Medicare Other | Attending: Emergency Medicine | Admitting: Emergency Medicine

## 2018-07-28 ENCOUNTER — Other Ambulatory Visit: Payer: Self-pay

## 2018-07-28 ENCOUNTER — Encounter: Payer: Self-pay | Admitting: Emergency Medicine

## 2018-07-28 DIAGNOSIS — I272 Pulmonary hypertension, unspecified: Secondary | ICD-10-CM | POA: Insufficient documentation

## 2018-07-28 DIAGNOSIS — I480 Paroxysmal atrial fibrillation: Secondary | ICD-10-CM | POA: Insufficient documentation

## 2018-07-28 DIAGNOSIS — Z79899 Other long term (current) drug therapy: Secondary | ICD-10-CM | POA: Diagnosis not present

## 2018-07-28 DIAGNOSIS — T8241XA Breakdown (mechanical) of vascular dialysis catheter, initial encounter: Secondary | ICD-10-CM | POA: Diagnosis not present

## 2018-07-28 DIAGNOSIS — Z820 Family history of epilepsy and other diseases of the nervous system: Secondary | ICD-10-CM | POA: Insufficient documentation

## 2018-07-28 DIAGNOSIS — Z992 Dependence on renal dialysis: Secondary | ICD-10-CM | POA: Insufficient documentation

## 2018-07-28 DIAGNOSIS — I132 Hypertensive heart and chronic kidney disease with heart failure and with stage 5 chronic kidney disease, or end stage renal disease: Secondary | ICD-10-CM | POA: Diagnosis not present

## 2018-07-28 DIAGNOSIS — Z955 Presence of coronary angioplasty implant and graft: Secondary | ICD-10-CM | POA: Diagnosis not present

## 2018-07-28 DIAGNOSIS — Z469 Encounter for fitting and adjustment of unspecified device: Secondary | ICD-10-CM | POA: Diagnosis not present

## 2018-07-28 DIAGNOSIS — E785 Hyperlipidemia, unspecified: Secondary | ICD-10-CM

## 2018-07-28 DIAGNOSIS — Z794 Long term (current) use of insulin: Secondary | ICD-10-CM | POA: Insufficient documentation

## 2018-07-28 DIAGNOSIS — D631 Anemia in chronic kidney disease: Secondary | ICD-10-CM | POA: Insufficient documentation

## 2018-07-28 DIAGNOSIS — I5022 Chronic systolic (congestive) heart failure: Secondary | ICD-10-CM | POA: Insufficient documentation

## 2018-07-28 DIAGNOSIS — T8242XA Displacement of vascular dialysis catheter, initial encounter: Secondary | ICD-10-CM | POA: Diagnosis not present

## 2018-07-28 DIAGNOSIS — I252 Old myocardial infarction: Secondary | ICD-10-CM | POA: Diagnosis not present

## 2018-07-28 DIAGNOSIS — Z833 Family history of diabetes mellitus: Secondary | ICD-10-CM | POA: Insufficient documentation

## 2018-07-28 DIAGNOSIS — Z951 Presence of aortocoronary bypass graft: Secondary | ICD-10-CM | POA: Insufficient documentation

## 2018-07-28 DIAGNOSIS — Z8249 Family history of ischemic heart disease and other diseases of the circulatory system: Secondary | ICD-10-CM | POA: Diagnosis not present

## 2018-07-28 DIAGNOSIS — I2510 Atherosclerotic heart disease of native coronary artery without angina pectoris: Secondary | ICD-10-CM | POA: Diagnosis not present

## 2018-07-28 DIAGNOSIS — E1122 Type 2 diabetes mellitus with diabetic chronic kidney disease: Secondary | ICD-10-CM | POA: Insufficient documentation

## 2018-07-28 DIAGNOSIS — I420 Dilated cardiomyopathy: Secondary | ICD-10-CM | POA: Diagnosis not present

## 2018-07-28 DIAGNOSIS — N186 End stage renal disease: Secondary | ICD-10-CM | POA: Diagnosis not present

## 2018-07-28 DIAGNOSIS — Z841 Family history of disorders of kidney and ureter: Secondary | ICD-10-CM | POA: Insufficient documentation

## 2018-07-28 DIAGNOSIS — T82898A Other specified complication of vascular prosthetic devices, implants and grafts, initial encounter: Secondary | ICD-10-CM | POA: Diagnosis not present

## 2018-07-28 DIAGNOSIS — Z7982 Long term (current) use of aspirin: Secondary | ICD-10-CM | POA: Insufficient documentation

## 2018-07-28 DIAGNOSIS — I255 Ischemic cardiomyopathy: Secondary | ICD-10-CM | POA: Diagnosis not present

## 2018-07-28 HISTORY — PX: DIALYSIS/PERMA CATHETER INSERTION: CATH118288

## 2018-07-28 LAB — GLUCOSE, CAPILLARY: Glucose-Capillary: 253 mg/dL — ABNORMAL HIGH (ref 70–99)

## 2018-07-28 SURGERY — DIALYSIS/PERMA CATHETER INSERTION
Anesthesia: Moderate Sedation

## 2018-07-28 MED ORDER — SODIUM CHLORIDE 0.9 % IV SOLN
INTRAVENOUS | Status: DC
  Administered 2018-07-28: 15:00:00 via INTRAVENOUS

## 2018-07-28 MED ORDER — CEFAZOLIN SODIUM-DEXTROSE 1-4 GM/50ML-% IV SOLN
INTRAVENOUS | Status: AC
  Filled 2018-07-28: qty 50

## 2018-07-28 MED ORDER — MIDAZOLAM HCL 5 MG/5ML IJ SOLN
INTRAMUSCULAR | Status: AC
  Filled 2018-07-28: qty 5

## 2018-07-28 MED ORDER — MIDAZOLAM HCL 2 MG/2ML IJ SOLN
INTRAMUSCULAR | Status: DC | PRN
  Administered 2018-07-28: 2 mg via INTRAVENOUS

## 2018-07-28 MED ORDER — FENTANYL CITRATE (PF) 100 MCG/2ML IJ SOLN
INTRAMUSCULAR | Status: AC
  Filled 2018-07-28: qty 2

## 2018-07-28 MED ORDER — FENTANYL CITRATE (PF) 100 MCG/2ML IJ SOLN
INTRAMUSCULAR | Status: DC | PRN
  Administered 2018-07-28: 50 ug via INTRAVENOUS

## 2018-07-28 MED ORDER — HEPARIN SODIUM (PORCINE) 10000 UNIT/ML IJ SOLN
INTRAMUSCULAR | Status: AC
  Filled 2018-07-28: qty 1

## 2018-07-28 MED ORDER — CEFAZOLIN SODIUM-DEXTROSE 1-4 GM/50ML-% IV SOLN
1.0000 g | Freq: Once | INTRAVENOUS | Status: AC
  Administered 2018-07-28: 1 g via INTRAVENOUS

## 2018-07-28 SURGICAL SUPPLY — 9 items
BIOPATCH RED 1 DISK 7.0 (GAUZE/BANDAGES/DRESSINGS) ×2 IMPLANT
BIOPATCH RED 1IN DISK 7.0MM (GAUZE/BANDAGES/DRESSINGS) ×1
CATH PALINDROME-P 19CM W/VT (CATHETERS) ×3 IMPLANT
DERMABOND ADVANCED (GAUZE/BANDAGES/DRESSINGS) ×2
DERMABOND ADVANCED .7 DNX12 (GAUZE/BANDAGES/DRESSINGS) ×1 IMPLANT
GUIDEWIRE SUPER STIFF .035X180 (WIRE) ×3 IMPLANT
PACK ANGIOGRAPHY (CUSTOM PROCEDURE TRAY) ×3 IMPLANT
SUT MNCRL AB 4-0 PS2 18 (SUTURE) ×3 IMPLANT
SUT SILK 0 FSL (SUTURE) ×6 IMPLANT

## 2018-07-28 NOTE — ED Triage Notes (Signed)
Patient from home via [facility]. Reports that she had complete dialysis treatment yesterday and one of the nurses made the comment that her catheter seemed "kinked". Patient reports she was showering this morning and reports noticing one of the line off of her chest access was missing. Line appears cut but patient denies knowing what happened. No active bleeding noted at this time. Patient denies any other complaints.

## 2018-07-28 NOTE — ED Notes (Signed)
Patient ambulatory to restroom with steady gait. Explained we are still waiting on vascular before plan of care is determined. Patient verbalized understanding.
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
## 2018-07-28 NOTE — ED Notes (Signed)
This RN to bedside at this time. Pt sitting up on side of bed. Pt denies any needs. This RN explained and apologized for delay, explained EDP contacting Dr. Delana Meyer for possible estimate of time. Pt states understanding. Visualized in NAD at this time.

## 2018-07-28 NOTE — ED Notes (Signed)
This RN to bedside, introduced self to patient. Apologized and explained delay to patient. Pt states understanding at this time. Pt appears in NAD at this time. Will continue to monitor for further patient needs.

## 2018-07-28 NOTE — Op Note (Signed)
OPERATIVE NOTE   PROCEDURE: 1. Insertion of non-tunneled dialysis catheter 19 cm tip to cuff palindrome approach same venous access.  PRE-OPERATIVE DIAGNOSIS: Complication of dialysis access device with transection of the catheter; end-stage renal disease requiring hemodialysis  POST-OPERATIVE DIAGNOSIS: Same  SURGEON: Delana Meyer Dolores Lory  ESTIMATED BLOOD LOSS: Minimal cc  CONTRAST USED:  None  INDICATIONS:   Theresa Rogers 68 y.o. female who presents with transection of her catheter.  Adequate dialysis has not been possible.  DESCRIPTION: After obtaining full informed written consent, the patient was positioned supine. The right neck and chest wall as well as the existing catheter was prepped and draped in a sterile fashion.  The existing catheter is then transected proximal to the skin insertion site.  The guidewire is advanced without difficulty.  The new catheter is fed into the central venous system over the wire without difficulty.  All lumens aspirate and flush well.  Catheter secured to the skin of the right chest wall with 0 silk. A sterile dressing is applied with a Biopatch.  COMPLICATIONS: None  CONDITION: Theresa Rogers [facility] Office:  [phone]  07/28/2018, 5:36 PM
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
## 2018-07-28 NOTE — ED Notes (Signed)
Pt resting in bed, NAD noted at this time. Snoring respirations heard. Will continue to monitor for further patient needs.

## 2018-07-29 ENCOUNTER — Encounter: Payer: Self-pay | Admitting: Vascular Surgery

## 2018-07-29 DIAGNOSIS — Z992 Dependence on renal dialysis: Secondary | ICD-10-CM | POA: Diagnosis not present

## 2018-07-29 DIAGNOSIS — D509 Iron deficiency anemia, unspecified: Secondary | ICD-10-CM | POA: Diagnosis not present

## 2018-07-29 DIAGNOSIS — D631 Anemia in chronic kidney disease: Secondary | ICD-10-CM | POA: Diagnosis not present

## 2018-07-29 DIAGNOSIS — N186 End stage renal disease: Secondary | ICD-10-CM | POA: Diagnosis not present

## 2018-07-29 MED ORDER — CEFAZOLIN SODIUM-DEXTROSE 1-4 GM/50ML-% IV SOLN: 1.0000 g | Freq: Once | INTRAVENOUS | Status: DC

## 2018-07-30 ENCOUNTER — Ambulatory Visit: Admission: RE | Admit: 2018-07-30 | Payer: Medicare Other | Source: Home / Self Care | Admitting: Vascular Surgery

## 2018-07-30 ENCOUNTER — Encounter: Admission: RE | Payer: Self-pay | Source: Home / Self Care

## 2018-07-30 SURGERY — UPPER EXTREMITY ANGIOGRAPHY
Anesthesia: Moderate Sedation | Site: Arm Upper | Laterality: Left

## 2018-08-02 ENCOUNTER — Encounter (INDEPENDENT_AMBULATORY_CARE_PROVIDER_SITE_OTHER): Payer: Self-pay | Admitting: Nurse Practitioner
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
## 2018-08-03 DIAGNOSIS — D509 Iron deficiency anemia, unspecified: Secondary | ICD-10-CM | POA: Diagnosis not present

## 2018-08-03 DIAGNOSIS — Z992 Dependence on renal dialysis: Secondary | ICD-10-CM | POA: Diagnosis not present

## 2018-08-03 DIAGNOSIS — N186 End stage renal disease: Secondary | ICD-10-CM | POA: Diagnosis not present

## 2018-08-03 DIAGNOSIS — D631 Anemia in chronic kidney disease: Secondary | ICD-10-CM | POA: Diagnosis not present

## 2018-08-05 DIAGNOSIS — Z992 Dependence on renal dialysis: Secondary | ICD-10-CM | POA: Diagnosis not present

## 2018-08-05 DIAGNOSIS — N186 End stage renal disease: Secondary | ICD-10-CM | POA: Diagnosis not present

## 2018-08-05 DIAGNOSIS — D631 Anemia in chronic kidney disease: Secondary | ICD-10-CM | POA: Diagnosis not present

## 2018-08-05 DIAGNOSIS — D509 Iron deficiency anemia, unspecified: Secondary | ICD-10-CM | POA: Diagnosis not present

## 2018-08-07 DIAGNOSIS — N186 End stage renal disease: Secondary | ICD-10-CM | POA: Diagnosis not present

## 2018-08-07 DIAGNOSIS — Z992 Dependence on renal dialysis: Secondary | ICD-10-CM | POA: Diagnosis not present

## 2018-08-07 DIAGNOSIS — D509 Iron deficiency anemia, unspecified: Secondary | ICD-10-CM | POA: Diagnosis not present

## 2018-08-07 DIAGNOSIS — D631 Anemia in chronic kidney disease: Secondary | ICD-10-CM | POA: Diagnosis not present

## 2018-08-08 DIAGNOSIS — Z992 Dependence on renal dialysis: Secondary | ICD-10-CM | POA: Diagnosis not present

## 2018-08-08 DIAGNOSIS — N186 End stage renal disease: Secondary | ICD-10-CM | POA: Diagnosis not present

## 2018-08-12 DIAGNOSIS — D509 Iron deficiency anemia, unspecified: Secondary | ICD-10-CM | POA: Diagnosis not present

## 2018-08-12 DIAGNOSIS — Z992 Dependence on renal dialysis: Secondary | ICD-10-CM | POA: Diagnosis not present

## 2018-08-12 DIAGNOSIS — N2581 Secondary hyperparathyroidism of renal origin: Secondary | ICD-10-CM | POA: Diagnosis not present

## 2018-08-12 DIAGNOSIS — D631 Anemia in chronic kidney disease: Secondary | ICD-10-CM | POA: Diagnosis not present

## 2018-08-12 DIAGNOSIS — Z23 Encounter for immunization: Secondary | ICD-10-CM | POA: Diagnosis not present

## 2018-08-12 DIAGNOSIS — N186 End stage renal disease: Secondary | ICD-10-CM | POA: Diagnosis not present

## 2018-08-13 DIAGNOSIS — N184 Chronic kidney disease, stage 4 (severe): Secondary | ICD-10-CM | POA: Diagnosis not present

## 2018-08-13 DIAGNOSIS — E1022 Type 1 diabetes mellitus with diabetic chronic kidney disease: Secondary | ICD-10-CM | POA: Diagnosis not present

## 2018-08-14 DIAGNOSIS — D509 Iron deficiency anemia, unspecified: Secondary | ICD-10-CM | POA: Diagnosis not present

## 2018-08-14 DIAGNOSIS — N2581 Secondary hyperparathyroidism of renal origin: Secondary | ICD-10-CM | POA: Diagnosis not present

## 2018-08-14 DIAGNOSIS — Z23 Encounter for immunization: Secondary | ICD-10-CM | POA: Diagnosis not present

## 2018-08-14 DIAGNOSIS — D631 Anemia in chronic kidney disease: Secondary | ICD-10-CM | POA: Diagnosis not present

## 2018-08-14 DIAGNOSIS — N186 End stage renal disease: Secondary | ICD-10-CM | POA: Diagnosis not present

## 2018-08-14 DIAGNOSIS — Z992 Dependence on renal dialysis: Secondary | ICD-10-CM | POA: Diagnosis not present

## 2018-08-17 DIAGNOSIS — N2581 Secondary hyperparathyroidism of renal origin: Secondary | ICD-10-CM | POA: Diagnosis not present

## 2018-08-17 DIAGNOSIS — Z23 Encounter for immunization: Secondary | ICD-10-CM | POA: Diagnosis not present

## 2018-08-17 DIAGNOSIS — Z992 Dependence on renal dialysis: Secondary | ICD-10-CM | POA: Diagnosis not present

## 2018-08-17 DIAGNOSIS — N186 End stage renal disease: Secondary | ICD-10-CM | POA: Diagnosis not present

## 2018-08-17 DIAGNOSIS — D631 Anemia in chronic kidney disease: Secondary | ICD-10-CM | POA: Diagnosis not present

## 2018-08-17 DIAGNOSIS — D509 Iron deficiency anemia, unspecified: Secondary | ICD-10-CM | POA: Diagnosis not present

## 2018-08-18 ENCOUNTER — Encounter (INDEPENDENT_AMBULATORY_CARE_PROVIDER_SITE_OTHER): Payer: Self-pay | Admitting: Vascular Surgery

## 2018-08-18 ENCOUNTER — Telehealth (INDEPENDENT_AMBULATORY_CARE_PROVIDER_SITE_OTHER): Payer: Self-pay

## 2018-08-18 ENCOUNTER — Encounter (INDEPENDENT_AMBULATORY_CARE_PROVIDER_SITE_OTHER): Payer: Self-pay

## 2018-08-18 ENCOUNTER — Ambulatory Visit (INDEPENDENT_AMBULATORY_CARE_PROVIDER_SITE_OTHER): Payer: Medicare Other | Admitting: Vascular Surgery

## 2018-08-18 ENCOUNTER — Other Ambulatory Visit: Payer: Self-pay

## 2018-08-18 ENCOUNTER — Encounter (INDEPENDENT_AMBULATORY_CARE_PROVIDER_SITE_OTHER): Payer: Medicare Other

## 2018-08-18 ENCOUNTER — Other Ambulatory Visit (INDEPENDENT_AMBULATORY_CARE_PROVIDER_SITE_OTHER): Payer: Self-pay | Admitting: Vascular Surgery

## 2018-08-18 VITALS — BP 161/73 | HR 85 | Resp 16 | Ht 64.0 in | Wt 143.6 lb

## 2018-08-18 DIAGNOSIS — E1122 Type 2 diabetes mellitus with diabetic chronic kidney disease: Secondary | ICD-10-CM

## 2018-08-18 DIAGNOSIS — N186 End stage renal disease: Secondary | ICD-10-CM

## 2018-08-18 DIAGNOSIS — Z79899 Other long term (current) drug therapy: Secondary | ICD-10-CM

## 2018-08-18 DIAGNOSIS — I10 Essential (primary) hypertension: Secondary | ICD-10-CM

## 2018-08-18 DIAGNOSIS — I120 Hypertensive chronic kidney disease with stage 5 chronic kidney disease or end stage renal disease: Secondary | ICD-10-CM

## 2018-08-18 DIAGNOSIS — E118 Type 2 diabetes mellitus with unspecified complications: Secondary | ICD-10-CM

## 2018-08-18 DIAGNOSIS — Z9889 Other specified postprocedural states: Secondary | ICD-10-CM

## 2018-08-18 DIAGNOSIS — Z992 Dependence on renal dialysis: Secondary | ICD-10-CM

## 2018-08-18 NOTE — Telephone Encounter (Signed)
The patient was called so I could give her the surgical day and pre-op day/time. A message was left for a return call to discuss this.patient is scheduled for 09/17/2018 with Dr. Lucky Cowboy and her pre-op is scheduled for 09/01/2018 with a 9:00 am arrival time.

## 2018-08-18 NOTE — Patient Instructions (Signed)
Peritoneal Dialysis Catheter Placement  Peritoneal dialysis catheter placement is a surgery to insert a thin, flexible tube (catheter) into the abdomen. The catheter will be used for peritoneal dialysis, which is a process for filtering the blood. The catheter will be small, soft, and easy to conceal. The catheter placement is usually done at least 2 weeks before peritoneal dialysis is started. During dialysis, wastes, salt, and extra water are removed from the blood. In peritoneal dialysis, these tasks are performed by transferring a fluid (dialysate) to and from the abdomen during each session. The fluid goes through the catheter to enter the abdomen at the start of each dialysis session, and it drains out of the body through the catheter at the end of each session. This procedure will be done using one of the following techniques:  Open technique. This is when the surgery is performed through one incision.  Laparoscopic technique. This is when smaller incisions are made and a tube with a light and camera on the end (laparoscope) is inserted through one of the incisions to help perform the surgery. The camera sends images to a video screen in the operating room. This lets the surgeon see inside the abdomen during the procedure. Tell a health care provider about:  Any allergies you have.  All medicines you are taking, including vitamins, herbs, eye drops, creams, and over-the-counter medicines.  Any problems you or family members have had with anesthetic medicines.  Any blood disorders you have.  Any surgeries you have had.  Any history of smoking.  Any medical conditions you have.  Whether you are pregnant or may be pregnant. What are the risks? Generally, this is a safe procedure. However, problems may occur, including:  Infection.  Too much bleeding.  A collection of blood near the incision (hematoma).  Damage to blood vessels, tissues, or organs in the abdomen  area.  Allergic reactions to medicines.  Pain or cramping.  Slow healing.  Catheter problems after the surgery, such as: ? The catheter becoming blocked. ? The catheter moving out of place. ? The catheter poking into or wrapping around intestines. ? Fluid leaking around the catheter.  Scarring.  Skin damage. What happens before the procedure? Medicines Ask your health care provider about:  Changing or stopping your regular medicines. This is especially important if you take diabetes medicines or blood thinners.  Taking medicines such as aspirin and ibuprofen. These medicines can thin your blood. Do not take these medicines before your procedure if your health care provider tells you not to. Staying hydrated Follow instructions from your health care provider about hydration, which may include:  Up to 2 hours before the procedure - you may continue to drink clear liquids, such as water, clear fruit juice, black coffee, and plain tea. Eating and drinking restrictions Follow instructions from your health care provider about eating and drinking, which may include:  8 hours before the procedure - stop eating heavy meals or foods such as meat, fried foods, or fatty foods.  6 hours before the procedure - stop eating light meals or foods, such as toast or cereal.  6 hours before the procedure - stop drinking milk or drinks that contain milk.  2 hours before the procedure - stop drinking clear liquids. General instructions  Ask your health care provider how your catheter site will be marked or identified. Your health care provider will discuss the best site for the catheter to be placed. The site will be chosen to help prevent  the catheter from being flattened or damaged, and to make it as comfortable as possible for you.  You may be asked to shower with a germ-killing soap.  You may have a CT scan of your abdomen.  You may have a blood sample taken.  Plan to have someone take  you home from the hospital or clinic. What happens during the procedure?  To lower your risk of infection: ? Your health care team will wash or sanitize their hands. ? Your skin will be washed with soap. ? Hair may be removed from the surgical area.  An IV will be inserted into one of your veins.  You may be given antibiotic medicine through your IV.  You will be given a medicine to make you fall asleep (general anesthetic).  If you are having an open surgery, one larger incision will be made in the abdomen.  If you are having laparoscopic surgery, a laparoscope and instruments will be put through small incisions in the abdomen.  The catheter will be put in place.  A short tunnel will be made under the skin to a location where the catheter exits the abdomen.  Stitches (sutures) will be placed around the catheter to hold it in place.  Your incisions will be closed with sutures or staples. The procedure may vary among health care providers and hospitals. What happens after the procedure?  Your blood pressure, heart rate, breathing rate, and blood oxygen level will be monitored until the medicines you were given have worn off.  You may have some pain. You will be given pain medicine as needed.  You will be given instructions about how to care for your catheter and how it is used for the dialysis process. Summary  Peritoneal dialysis catheter placement is a surgery to insert a thin, flexible tube (catheter) in your abdomen. This surgery must be done before you begin peritoneal dialysis.  Before the procedure, your health care provider will discuss the best site for the catheter to be placed. The site will be chosen to help prevent the catheter from being flattened or damaged, and to make it as comfortable as possible for you.  After the procedure, you will be given instructions about how to care for your catheter and how it is used for the dialysis process. This information is not  intended to replace advice given to you by your health care provider. Make sure you discuss any questions you have with your health care provider. Document Released: 05/15/2009 Document Revised: 07/05/2016 Document Reviewed: 07/05/2016 Elsevier Interactive Patient Education  2019 [employer].

## 2018-08-18 NOTE — Assessment & Plan Note (Signed)
The patient has end-stage renal disease and steal syndrome has complicated the situation.  This is impairing her quality of life.  She has decided to transition to peritoneal dialysis which I think is a very reasonable option at this point.  With that in mind, we should go ahead and ligate her AV fistula to remove her steal symptoms if she is not going to be using this.  Peritoneal dialysis catheter placement and ligation of the AV fistula can be done concomitantly.  This would still be an outpatient procedure.  She will continue to use her PermCath until this is performed.  Risks and benefits were discussed and she is agreeable to proceed.
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
## 2018-08-18 NOTE — Assessment & Plan Note (Signed)
blood glucose control important in reducing the progression of atherosclerotic disease. Also, involved in wound healing. On appropriate medications.  

## 2018-08-18 NOTE — Assessment & Plan Note (Signed)
blood pressure control important in reducing the progression of atherosclerotic disease. On appropriate oral medications.  

## 2018-08-19 DIAGNOSIS — N186 End stage renal disease: Secondary | ICD-10-CM | POA: Diagnosis not present

## 2018-08-19 DIAGNOSIS — Z23 Encounter for immunization: Secondary | ICD-10-CM | POA: Diagnosis not present

## 2018-08-19 DIAGNOSIS — N2581 Secondary hyperparathyroidism of renal origin: Secondary | ICD-10-CM | POA: Diagnosis not present

## 2018-08-19 DIAGNOSIS — D631 Anemia in chronic kidney disease: Secondary | ICD-10-CM | POA: Diagnosis not present

## 2018-08-19 DIAGNOSIS — Z992 Dependence on renal dialysis: Secondary | ICD-10-CM | POA: Diagnosis not present

## 2018-08-19 DIAGNOSIS — D509 Iron deficiency anemia, unspecified: Secondary | ICD-10-CM | POA: Diagnosis not present

## 2018-08-21 DIAGNOSIS — N186 End stage renal disease: Secondary | ICD-10-CM | POA: Diagnosis not present

## 2018-08-21 DIAGNOSIS — N2581 Secondary hyperparathyroidism of renal origin: Secondary | ICD-10-CM | POA: Diagnosis not present

## 2018-08-21 DIAGNOSIS — D509 Iron deficiency anemia, unspecified: Secondary | ICD-10-CM | POA: Diagnosis not present

## 2018-08-21 DIAGNOSIS — Z23 Encounter for immunization: Secondary | ICD-10-CM | POA: Diagnosis not present

## 2018-08-21 DIAGNOSIS — D631 Anemia in chronic kidney disease: Secondary | ICD-10-CM | POA: Diagnosis not present

## 2018-08-21 DIAGNOSIS — Z992 Dependence on renal dialysis: Secondary | ICD-10-CM | POA: Diagnosis not present

## 2018-08-24 DIAGNOSIS — D631 Anemia in chronic kidney disease: Secondary | ICD-10-CM | POA: Diagnosis not present

## 2018-08-24 DIAGNOSIS — Z23 Encounter for immunization: Secondary | ICD-10-CM | POA: Diagnosis not present

## 2018-08-24 DIAGNOSIS — N2581 Secondary hyperparathyroidism of renal origin: Secondary | ICD-10-CM | POA: Diagnosis not present

## 2018-08-24 DIAGNOSIS — D509 Iron deficiency anemia, unspecified: Secondary | ICD-10-CM | POA: Diagnosis not present

## 2018-08-24 DIAGNOSIS — Z992 Dependence on renal dialysis: Secondary | ICD-10-CM | POA: Diagnosis not present

## 2018-08-24 DIAGNOSIS — N186 End stage renal disease: Secondary | ICD-10-CM | POA: Diagnosis not present

## 2018-08-26 ENCOUNTER — Telehealth (INDEPENDENT_AMBULATORY_CARE_PROVIDER_SITE_OTHER): Payer: Self-pay

## 2018-08-26 DIAGNOSIS — N186 End stage renal disease: Secondary | ICD-10-CM | POA: Diagnosis not present

## 2018-08-26 DIAGNOSIS — Z992 Dependence on renal dialysis: Secondary | ICD-10-CM | POA: Diagnosis not present

## 2018-08-26 DIAGNOSIS — D631 Anemia in chronic kidney disease: Secondary | ICD-10-CM | POA: Diagnosis not present

## 2018-08-26 DIAGNOSIS — Z23 Encounter for immunization: Secondary | ICD-10-CM | POA: Diagnosis not present

## 2018-08-26 DIAGNOSIS — N2581 Secondary hyperparathyroidism of renal origin: Secondary | ICD-10-CM | POA: Diagnosis not present

## 2018-08-26 DIAGNOSIS — D509 Iron deficiency anemia, unspecified: Secondary | ICD-10-CM | POA: Diagnosis not present

## 2018-08-26 NOTE — Telephone Encounter (Signed)
I attempted to contact the patient to let her know that her surgery will be postponed until the first of May due to the Covid-19 virus and at that time I will call to reschedule her surgery. A message was left for a callback.

## 2018-08-28 DIAGNOSIS — N2581 Secondary hyperparathyroidism of renal origin: Secondary | ICD-10-CM | POA: Diagnosis not present

## 2018-08-28 DIAGNOSIS — N186 End stage renal disease: Secondary | ICD-10-CM | POA: Diagnosis not present

## 2018-08-28 DIAGNOSIS — D631 Anemia in chronic kidney disease: Secondary | ICD-10-CM | POA: Diagnosis not present

## 2018-08-28 DIAGNOSIS — D509 Iron deficiency anemia, unspecified: Secondary | ICD-10-CM | POA: Diagnosis not present

## 2018-08-28 DIAGNOSIS — Z992 Dependence on renal dialysis: Secondary | ICD-10-CM | POA: Diagnosis not present

## 2018-08-28 DIAGNOSIS — Z23 Encounter for immunization: Secondary | ICD-10-CM | POA: Diagnosis not present

## 2018-08-31 DIAGNOSIS — D631 Anemia in chronic kidney disease: Secondary | ICD-10-CM | POA: Diagnosis not present

## 2018-08-31 DIAGNOSIS — Z23 Encounter for immunization: Secondary | ICD-10-CM | POA: Diagnosis not present

## 2018-08-31 DIAGNOSIS — N2581 Secondary hyperparathyroidism of renal origin: Secondary | ICD-10-CM | POA: Diagnosis not present

## 2018-08-31 DIAGNOSIS — Z992 Dependence on renal dialysis: Secondary | ICD-10-CM | POA: Diagnosis not present

## 2018-08-31 DIAGNOSIS — D509 Iron deficiency anemia, unspecified: Secondary | ICD-10-CM | POA: Diagnosis not present

## 2018-08-31 DIAGNOSIS — N186 End stage renal disease: Secondary | ICD-10-CM | POA: Diagnosis not present

## 2018-09-01 ENCOUNTER — Inpatient Hospital Stay: Admission: RE | Admit: 2018-09-01 | Payer: Medicare Other | Source: Ambulatory Visit

## 2018-09-02 ENCOUNTER — Telehealth (INDEPENDENT_AMBULATORY_CARE_PROVIDER_SITE_OTHER): Payer: Self-pay

## 2018-09-02 ENCOUNTER — Encounter (INDEPENDENT_AMBULATORY_CARE_PROVIDER_SITE_OTHER): Payer: Self-pay

## 2018-09-02 DIAGNOSIS — D509 Iron deficiency anemia, unspecified: Secondary | ICD-10-CM | POA: Diagnosis not present

## 2018-09-02 DIAGNOSIS — D631 Anemia in chronic kidney disease: Secondary | ICD-10-CM | POA: Diagnosis not present

## 2018-09-02 DIAGNOSIS — Z23 Encounter for immunization: Secondary | ICD-10-CM | POA: Diagnosis not present

## 2018-09-02 DIAGNOSIS — N186 End stage renal disease: Secondary | ICD-10-CM | POA: Diagnosis not present

## 2018-09-02 DIAGNOSIS — Z992 Dependence on renal dialysis: Secondary | ICD-10-CM | POA: Diagnosis not present

## 2018-09-02 DIAGNOSIS — N2581 Secondary hyperparathyroidism of renal origin: Secondary | ICD-10-CM | POA: Diagnosis not present

## 2018-09-02 NOTE — Telephone Encounter (Signed)
Patient has been rescheduled and the information will be mailed out to the patient. Patient has been rescheduled to 10/15/2018 with Dr. Lucky Cowboy.

## 2018-09-04 DIAGNOSIS — D631 Anemia in chronic kidney disease: Secondary | ICD-10-CM | POA: Diagnosis not present

## 2018-09-04 DIAGNOSIS — N186 End stage renal disease: Secondary | ICD-10-CM | POA: Diagnosis not present

## 2018-09-04 DIAGNOSIS — Z23 Encounter for immunization: Secondary | ICD-10-CM | POA: Diagnosis not present

## 2018-09-04 DIAGNOSIS — N2581 Secondary hyperparathyroidism of renal origin: Secondary | ICD-10-CM | POA: Diagnosis not present

## 2018-09-04 DIAGNOSIS — D509 Iron deficiency anemia, unspecified: Secondary | ICD-10-CM | POA: Diagnosis not present

## 2018-09-04 DIAGNOSIS — Z992 Dependence on renal dialysis: Secondary | ICD-10-CM | POA: Diagnosis not present

## 2018-09-07 DIAGNOSIS — Z992 Dependence on renal dialysis: Secondary | ICD-10-CM | POA: Diagnosis not present

## 2018-09-07 DIAGNOSIS — D509 Iron deficiency anemia, unspecified: Secondary | ICD-10-CM | POA: Diagnosis not present

## 2018-09-07 DIAGNOSIS — D631 Anemia in chronic kidney disease: Secondary | ICD-10-CM | POA: Diagnosis not present

## 2018-09-07 DIAGNOSIS — N186 End stage renal disease: Secondary | ICD-10-CM | POA: Diagnosis not present

## 2018-09-07 DIAGNOSIS — N2581 Secondary hyperparathyroidism of renal origin: Secondary | ICD-10-CM | POA: Diagnosis not present

## 2018-09-07 DIAGNOSIS — Z23 Encounter for immunization: Secondary | ICD-10-CM | POA: Diagnosis not present

## 2018-09-08 DIAGNOSIS — Z992 Dependence on renal dialysis: Secondary | ICD-10-CM | POA: Diagnosis not present

## 2018-09-08 DIAGNOSIS — N186 End stage renal disease: Secondary | ICD-10-CM | POA: Diagnosis not present

## 2018-09-09 DIAGNOSIS — D631 Anemia in chronic kidney disease: Secondary | ICD-10-CM | POA: Diagnosis not present

## 2018-09-09 DIAGNOSIS — Z992 Dependence on renal dialysis: Secondary | ICD-10-CM | POA: Diagnosis not present

## 2018-09-09 DIAGNOSIS — N186 End stage renal disease: Secondary | ICD-10-CM | POA: Diagnosis not present

## 2018-09-09 DIAGNOSIS — D509 Iron deficiency anemia, unspecified: Secondary | ICD-10-CM | POA: Diagnosis not present

## 2018-09-09 DIAGNOSIS — N2581 Secondary hyperparathyroidism of renal origin: Secondary | ICD-10-CM | POA: Diagnosis not present

## 2018-09-10 ENCOUNTER — Ambulatory Visit: Payer: Medicare Other | Admitting: Cardiovascular Disease

## 2018-09-11 DIAGNOSIS — Z992 Dependence on renal dialysis: Secondary | ICD-10-CM | POA: Diagnosis not present

## 2018-09-11 DIAGNOSIS — N2581 Secondary hyperparathyroidism of renal origin: Secondary | ICD-10-CM | POA: Diagnosis not present

## 2018-09-11 DIAGNOSIS — D509 Iron deficiency anemia, unspecified: Secondary | ICD-10-CM | POA: Diagnosis not present

## 2018-09-11 DIAGNOSIS — N186 End stage renal disease: Secondary | ICD-10-CM | POA: Diagnosis not present

## 2018-09-11 DIAGNOSIS — D631 Anemia in chronic kidney disease: Secondary | ICD-10-CM | POA: Diagnosis not present

## 2018-09-14 DIAGNOSIS — Z992 Dependence on renal dialysis: Secondary | ICD-10-CM | POA: Diagnosis not present

## 2018-09-14 DIAGNOSIS — N186 End stage renal disease: Secondary | ICD-10-CM | POA: Diagnosis not present

## 2018-09-14 DIAGNOSIS — D631 Anemia in chronic kidney disease: Secondary | ICD-10-CM | POA: Diagnosis not present

## 2018-09-14 DIAGNOSIS — N2581 Secondary hyperparathyroidism of renal origin: Secondary | ICD-10-CM | POA: Diagnosis not present

## 2018-09-14 DIAGNOSIS — D509 Iron deficiency anemia, unspecified: Secondary | ICD-10-CM | POA: Diagnosis not present

## 2018-09-18 DIAGNOSIS — Z992 Dependence on renal dialysis: Secondary | ICD-10-CM | POA: Diagnosis not present

## 2018-09-18 DIAGNOSIS — N186 End stage renal disease: Secondary | ICD-10-CM | POA: Diagnosis not present

## 2018-09-18 DIAGNOSIS — D631 Anemia in chronic kidney disease: Secondary | ICD-10-CM | POA: Diagnosis not present

## 2018-09-18 DIAGNOSIS — D509 Iron deficiency anemia, unspecified: Secondary | ICD-10-CM | POA: Diagnosis not present

## 2018-09-18 DIAGNOSIS — N2581 Secondary hyperparathyroidism of renal origin: Secondary | ICD-10-CM | POA: Diagnosis not present

## 2018-09-23 DIAGNOSIS — D631 Anemia in chronic kidney disease: Secondary | ICD-10-CM | POA: Diagnosis not present

## 2018-09-23 DIAGNOSIS — N186 End stage renal disease: Secondary | ICD-10-CM | POA: Diagnosis not present

## 2018-09-23 DIAGNOSIS — N2581 Secondary hyperparathyroidism of renal origin: Secondary | ICD-10-CM | POA: Diagnosis not present

## 2018-09-23 DIAGNOSIS — Z992 Dependence on renal dialysis: Secondary | ICD-10-CM | POA: Diagnosis not present

## 2018-09-23 DIAGNOSIS — D509 Iron deficiency anemia, unspecified: Secondary | ICD-10-CM | POA: Diagnosis not present

## 2018-09-25 DIAGNOSIS — N186 End stage renal disease: Secondary | ICD-10-CM | POA: Diagnosis not present

## 2018-09-25 DIAGNOSIS — D509 Iron deficiency anemia, unspecified: Secondary | ICD-10-CM | POA: Diagnosis not present

## 2018-09-25 DIAGNOSIS — D631 Anemia in chronic kidney disease: Secondary | ICD-10-CM | POA: Diagnosis not present

## 2018-09-25 DIAGNOSIS — Z992 Dependence on renal dialysis: Secondary | ICD-10-CM | POA: Diagnosis not present

## 2018-09-25 DIAGNOSIS — N2581 Secondary hyperparathyroidism of renal origin: Secondary | ICD-10-CM | POA: Diagnosis not present

## 2018-09-28 DIAGNOSIS — Z992 Dependence on renal dialysis: Secondary | ICD-10-CM | POA: Diagnosis not present

## 2018-09-28 DIAGNOSIS — D509 Iron deficiency anemia, unspecified: Secondary | ICD-10-CM | POA: Diagnosis not present

## 2018-09-28 DIAGNOSIS — D631 Anemia in chronic kidney disease: Secondary | ICD-10-CM | POA: Diagnosis not present

## 2018-09-28 DIAGNOSIS — N2581 Secondary hyperparathyroidism of renal origin: Secondary | ICD-10-CM | POA: Diagnosis not present

## 2018-09-28 DIAGNOSIS — N186 End stage renal disease: Secondary | ICD-10-CM | POA: Diagnosis not present

## 2018-09-30 DIAGNOSIS — Z992 Dependence on renal dialysis: Secondary | ICD-10-CM | POA: Diagnosis not present

## 2018-09-30 DIAGNOSIS — N2581 Secondary hyperparathyroidism of renal origin: Secondary | ICD-10-CM | POA: Diagnosis not present

## 2018-09-30 DIAGNOSIS — N186 End stage renal disease: Secondary | ICD-10-CM | POA: Diagnosis not present

## 2018-09-30 DIAGNOSIS — D631 Anemia in chronic kidney disease: Secondary | ICD-10-CM | POA: Diagnosis not present

## 2018-09-30 DIAGNOSIS — D509 Iron deficiency anemia, unspecified: Secondary | ICD-10-CM | POA: Diagnosis not present

## 2018-10-02 DIAGNOSIS — N186 End stage renal disease: Secondary | ICD-10-CM | POA: Diagnosis not present

## 2018-10-02 DIAGNOSIS — D631 Anemia in chronic kidney disease: Secondary | ICD-10-CM | POA: Diagnosis not present

## 2018-10-02 DIAGNOSIS — Z992 Dependence on renal dialysis: Secondary | ICD-10-CM | POA: Diagnosis not present

## 2018-10-02 DIAGNOSIS — N2581 Secondary hyperparathyroidism of renal origin: Secondary | ICD-10-CM | POA: Diagnosis not present

## 2018-10-02 DIAGNOSIS — D509 Iron deficiency anemia, unspecified: Secondary | ICD-10-CM | POA: Diagnosis not present

## 2018-10-05 DIAGNOSIS — Z992 Dependence on renal dialysis: Secondary | ICD-10-CM | POA: Diagnosis not present

## 2018-10-05 DIAGNOSIS — D631 Anemia in chronic kidney disease: Secondary | ICD-10-CM | POA: Diagnosis not present

## 2018-10-05 DIAGNOSIS — D509 Iron deficiency anemia, unspecified: Secondary | ICD-10-CM | POA: Diagnosis not present

## 2018-10-05 DIAGNOSIS — N2581 Secondary hyperparathyroidism of renal origin: Secondary | ICD-10-CM | POA: Diagnosis not present

## 2018-10-05 DIAGNOSIS — N186 End stage renal disease: Secondary | ICD-10-CM | POA: Diagnosis not present

## 2018-10-06 ENCOUNTER — Inpatient Hospital Stay: Admission: RE | Admit: 2018-10-06 | Payer: Medicare Other | Source: Ambulatory Visit

## 2018-10-07 DIAGNOSIS — Z992 Dependence on renal dialysis: Secondary | ICD-10-CM | POA: Diagnosis not present

## 2018-10-07 DIAGNOSIS — N2581 Secondary hyperparathyroidism of renal origin: Secondary | ICD-10-CM | POA: Diagnosis not present

## 2018-10-07 DIAGNOSIS — D509 Iron deficiency anemia, unspecified: Secondary | ICD-10-CM | POA: Diagnosis not present

## 2018-10-07 DIAGNOSIS — D631 Anemia in chronic kidney disease: Secondary | ICD-10-CM | POA: Diagnosis not present

## 2018-10-07 DIAGNOSIS — N186 End stage renal disease: Secondary | ICD-10-CM | POA: Diagnosis not present

## 2018-10-08 ENCOUNTER — Inpatient Hospital Stay: Admission: RE | Admit: 2018-10-08 | Payer: Medicare Other | Source: Ambulatory Visit

## 2018-10-08 DIAGNOSIS — Z992 Dependence on renal dialysis: Secondary | ICD-10-CM | POA: Diagnosis not present

## 2018-10-08 DIAGNOSIS — N186 End stage renal disease: Secondary | ICD-10-CM | POA: Diagnosis not present

## 2018-10-09 DIAGNOSIS — Z992 Dependence on renal dialysis: Secondary | ICD-10-CM | POA: Diagnosis not present

## 2018-10-09 DIAGNOSIS — N2581 Secondary hyperparathyroidism of renal origin: Secondary | ICD-10-CM | POA: Diagnosis not present

## 2018-10-09 DIAGNOSIS — D509 Iron deficiency anemia, unspecified: Secondary | ICD-10-CM | POA: Diagnosis not present

## 2018-10-09 DIAGNOSIS — N186 End stage renal disease: Secondary | ICD-10-CM | POA: Diagnosis not present

## 2018-10-12 DIAGNOSIS — D509 Iron deficiency anemia, unspecified: Secondary | ICD-10-CM | POA: Diagnosis not present

## 2018-10-12 DIAGNOSIS — N186 End stage renal disease: Secondary | ICD-10-CM | POA: Diagnosis not present

## 2018-10-12 DIAGNOSIS — Z992 Dependence on renal dialysis: Secondary | ICD-10-CM | POA: Diagnosis not present

## 2018-10-12 DIAGNOSIS — N2581 Secondary hyperparathyroidism of renal origin: Secondary | ICD-10-CM | POA: Diagnosis not present

## 2018-10-13 ENCOUNTER — Telehealth (INDEPENDENT_AMBULATORY_CARE_PROVIDER_SITE_OTHER): Payer: Self-pay

## 2018-10-13 ENCOUNTER — Encounter (INDEPENDENT_AMBULATORY_CARE_PROVIDER_SITE_OTHER): Payer: Self-pay

## 2018-10-13 NOTE — Telephone Encounter (Signed)
Patient called wanting to reschedule her surgery for two weeks out. I have the patient rescheduled to 10/29/2018 with a phone call pre-op on 10/22/2018 @ 8:00 am.

## 2018-10-14 DIAGNOSIS — N2581 Secondary hyperparathyroidism of renal origin: Secondary | ICD-10-CM | POA: Diagnosis not present

## 2018-10-14 DIAGNOSIS — D509 Iron deficiency anemia, unspecified: Secondary | ICD-10-CM | POA: Diagnosis not present

## 2018-10-14 DIAGNOSIS — N186 End stage renal disease: Secondary | ICD-10-CM | POA: Diagnosis not present

## 2018-10-14 DIAGNOSIS — Z992 Dependence on renal dialysis: Secondary | ICD-10-CM | POA: Diagnosis not present

## 2018-10-16 ENCOUNTER — Other Ambulatory Visit (INDEPENDENT_AMBULATORY_CARE_PROVIDER_SITE_OTHER): Payer: Self-pay | Admitting: Nurse Practitioner

## 2018-10-16 DIAGNOSIS — N2581 Secondary hyperparathyroidism of renal origin: Secondary | ICD-10-CM | POA: Diagnosis not present

## 2018-10-16 DIAGNOSIS — D509 Iron deficiency anemia, unspecified: Secondary | ICD-10-CM | POA: Diagnosis not present

## 2018-10-16 DIAGNOSIS — N186 End stage renal disease: Secondary | ICD-10-CM | POA: Diagnosis not present

## 2018-10-16 DIAGNOSIS — Z992 Dependence on renal dialysis: Secondary | ICD-10-CM | POA: Diagnosis not present

## 2018-10-19 DIAGNOSIS — N2581 Secondary hyperparathyroidism of renal origin: Secondary | ICD-10-CM | POA: Diagnosis not present

## 2018-10-19 DIAGNOSIS — D509 Iron deficiency anemia, unspecified: Secondary | ICD-10-CM | POA: Diagnosis not present

## 2018-10-19 DIAGNOSIS — N186 End stage renal disease: Secondary | ICD-10-CM | POA: Diagnosis not present

## 2018-10-19 DIAGNOSIS — Z992 Dependence on renal dialysis: Secondary | ICD-10-CM | POA: Diagnosis not present

## 2018-10-21 DIAGNOSIS — Z992 Dependence on renal dialysis: Secondary | ICD-10-CM | POA: Diagnosis not present

## 2018-10-21 DIAGNOSIS — N186 End stage renal disease: Secondary | ICD-10-CM | POA: Diagnosis not present

## 2018-10-21 DIAGNOSIS — N2581 Secondary hyperparathyroidism of renal origin: Secondary | ICD-10-CM | POA: Diagnosis not present

## 2018-10-21 DIAGNOSIS — D509 Iron deficiency anemia, unspecified: Secondary | ICD-10-CM | POA: Diagnosis not present

## 2018-10-22 ENCOUNTER — Other Ambulatory Visit: Payer: Self-pay

## 2018-10-22 ENCOUNTER — Encounter
Admission: RE | Admit: 2018-10-22 | Discharge: 2018-10-22 | Disposition: A | Discharge status: Home / Self Care | Payer: Medicare Other | Source: Ambulatory Visit | Attending: Vascular Surgery | Admitting: Vascular Surgery

## 2018-10-22 DIAGNOSIS — Z01812 Encounter for preprocedural laboratory examination: Secondary | ICD-10-CM | POA: Insufficient documentation

## 2018-10-22 LAB — CBC WITH DIFFERENTIAL/PLATELET
Abs Immature Granulocytes: 0.05 10*3/uL (ref 0.00–0.07)
Basophils Absolute: 0 10*3/uL (ref 0.0–0.1)
Basophils Relative: 1 %
Eosinophils Absolute: 0.1 10*3/uL (ref 0.0–0.5)
Eosinophils Relative: 1 %
HCT: 38.3 % (ref 36.0–46.0)
Hemoglobin: 12 g/dL (ref 12.0–15.0)
Immature Granulocytes: 1 %
Lymphocytes Relative: 5 %
Lymphs Abs: 0.4 10*3/uL — ABNORMAL LOW (ref 0.7–4.0)
MCH: 30 pg (ref 26.0–34.0)
MCHC: 31.3 g/dL (ref 30.0–36.0)
MCV: 95.8 fL (ref 80.0–100.0)
Monocytes Absolute: 0.9 10*3/uL (ref 0.1–1.0)
Monocytes Relative: 11 %
Neutro Abs: 7 10*3/uL (ref 1.7–7.7)
Neutrophils Relative %: 81 %
Platelets: 292 10*3/uL (ref 150–400)
RBC: 4 MIL/uL (ref 3.87–5.11)
RDW: 15.7 % — ABNORMAL HIGH (ref 11.5–15.5)
WBC: 8.5 10*3/uL (ref 4.0–10.5)
nRBC: 0 % (ref 0.0–0.2)

## 2018-10-22 LAB — BASIC METABOLIC PANEL
Anion gap: 12 (ref 5–15)
BUN: 28 mg/dL — ABNORMAL HIGH (ref 8–23)
CO2: 26 mmol/L (ref 22–32)
Calcium: 8.5 mg/dL — ABNORMAL LOW (ref 8.9–10.3)
Chloride: 95 mmol/L — ABNORMAL LOW (ref 98–111)
Creatinine, Ser: 2.79 mg/dL — ABNORMAL HIGH (ref 0.44–1.00)
GFR calc Af Amer: 20 mL/min — ABNORMAL LOW (ref >60)
GFR calc non Af Amer: 17 mL/min — ABNORMAL LOW (ref >60)
Glucose, Bld: 168 mg/dL — ABNORMAL HIGH (ref 70–99)
Potassium: 3.7 mmol/L (ref 3.5–5.1)
Sodium: 133 mmol/L — ABNORMAL LOW (ref 135–145)

## 2018-10-22 LAB — APTT: aPTT: 30 seconds (ref 24–36)

## 2018-10-22 LAB — TYPE AND SCREEN
ABO/RH(D): O POS
Antibody Screen: NEGATIVE

## 2018-10-22 LAB — PROTIME-INR
INR: 1.2 (ref 0.8–1.2)
Prothrombin Time: 15.1 seconds (ref 11.4–15.2)
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
## 2018-10-22 NOTE — Patient Instructions (Addendum)
Your procedure is scheduled on: 10-29-18 THURSDAY Report to Same Day Surgery 2nd floor medical mall [facility] Entrance-take elevator on left to 2nd floor.  Check in with surgery information desk.) To find out your arrival time please call [phone] between 1PM - 3PM on 10-28-18 [facility]  Remember: Instructions that are not followed completely may result in serious medical risk, up to and including death, or upon the discretion of your surgeon and anesthesiologist your surgery may need to be rescheduled.    _x___ 1. Do not eat food after midnight the night before your procedure. NO GUM OR CANDY AFTER MIDNIGHT. You may drink WATER up to 2 hours before you are scheduled to arrive at the hospital for your procedure.  Do not drink WATER  within 2 hours of your scheduled arrival to the hospital.  Type 1 and type 2 diabetics should only drink water.   ____Ensure clear carbohydrate drink on the way to the hospital for bariatric patients  ____Ensure clear carbohydrate drink 3 hours before surgery for Dr Dwyane Luo patients if physician instructed.    __x__ 2. No Alcohol for 24 hours before or after surgery.   __x__3. No Smoking or e-cigarettes for 24 prior to surgery.  Do not use any chewable tobacco products for at least 6 hour prior to surgery   ____  4. Bring all medications with you on the day of surgery if instructed.    __x__ 5. Notify your doctor if there is any change in your medical condition     (cold, fever, infections).    x___6. On the morning of surgery brush your teeth with toothpaste and water.  You may rinse your mouth with mouth wash if you wish.  Do not swallow any toothpaste or mouthwash.   Do not wear jewelry, make-up, hairpins, clips or nail polish.  Do not wear lotions, powders, or perfumes. You may wear deodorant.  Do not shave 48 hours prior to surgery. Men may shave face and neck.  Do not bring valuables to the hospital.    [facility] is not responsible for  any belongings or valuables.               Contacts, dentures or bridgework may not be worn into surgery.  Leave your suitcase in the car. After surgery it may be brought to your room.  For patients admitted to the hospital, discharge time is determined by your treatment team.  _  Patients discharged the day of surgery will not be allowed to drive home.  You will need someone to drive you home and stay with you the night of your procedure.    Please read over the following fact sheets that you were given:   [facility] Preparing for Surgery   _x___ TAKE THE FOLLOWING MEDICATION THE MORNING OF SURGERY WITH A SMALL SIP OF WATER. These include:  1. COREG (CARVEDILOL)  2. HYDRALAZINE(APRESOLINE)  3.  4.  5.  6.  ____Fleets enema or Magnesium Citrate as directed.   _x___ Use CHG Soap or sage wipes as directed on instruction sheet   ____ Use inhalers on the day of surgery and bring to hospital day of surgery  ____ Stop Metformin and Janumet 2 days prior to surgery.    _X___ Take 1/2 of usual insulin dose the night before surgery and none on the morning surgery-TAKE 8 UNITS OF LANTUS ON Wednesday NIGHT AND NO INSULIN AM OF SURGERY  _x___ Follow recommendations from Cardiologist, Pulmonologist or PCP  regarding stopping Aspirin, Coumadin, Plavix ,Eliquis, Effient, or Pradaxa, and Pletal-OK TO CONTINUE 81 MG ASPIRIN-DO NOT TAKE AM OF SURGERY  X____Stop Anti-inflammatories such as Advil, Aleve, Ibuprofen, Motrin, Naproxen, Naprosyn, Goodies powders or aspirin products NOW-OK to take Tylenol    ____ Stop supplements until after surgery.    ____ Bring C-Pap to the hospital.

## 2018-10-23 DIAGNOSIS — Z992 Dependence on renal dialysis: Secondary | ICD-10-CM | POA: Diagnosis not present

## 2018-10-23 DIAGNOSIS — N186 End stage renal disease: Secondary | ICD-10-CM | POA: Diagnosis not present

## 2018-10-23 DIAGNOSIS — N2581 Secondary hyperparathyroidism of renal origin: Secondary | ICD-10-CM | POA: Diagnosis not present

## 2018-10-23 DIAGNOSIS — D509 Iron deficiency anemia, unspecified: Secondary | ICD-10-CM | POA: Diagnosis not present

## 2018-10-27 ENCOUNTER — Emergency Department
Admission: EM | Admit: 2018-10-27 | Discharge: 2018-10-28 | Disposition: A | Discharge status: Home / Self Care | Payer: Medicare Other | Attending: Emergency Medicine | Admitting: Emergency Medicine

## 2018-10-27 ENCOUNTER — Emergency Department: Payer: Medicare Other

## 2018-10-27 ENCOUNTER — Encounter: Payer: Self-pay | Admitting: Emergency Medicine

## 2018-10-27 ENCOUNTER — Other Ambulatory Visit: Payer: Self-pay

## 2018-10-27 DIAGNOSIS — S76111A Strain of right quadriceps muscle, fascia and tendon, initial encounter: Secondary | ICD-10-CM | POA: Insufficient documentation

## 2018-10-27 DIAGNOSIS — I2510 Atherosclerotic heart disease of native coronary artery without angina pectoris: Secondary | ICD-10-CM | POA: Diagnosis not present

## 2018-10-27 DIAGNOSIS — Z79899 Other long term (current) drug therapy: Secondary | ICD-10-CM | POA: Insufficient documentation

## 2018-10-27 DIAGNOSIS — S8991XA Unspecified injury of right lower leg, initial encounter: Secondary | ICD-10-CM | POA: Diagnosis present

## 2018-10-27 DIAGNOSIS — I5022 Chronic systolic (congestive) heart failure: Secondary | ICD-10-CM | POA: Diagnosis not present

## 2018-10-27 DIAGNOSIS — X503XXA Overexertion from repetitive movements, initial encounter: Secondary | ICD-10-CM | POA: Insufficient documentation

## 2018-10-27 DIAGNOSIS — Z992 Dependence on renal dialysis: Secondary | ICD-10-CM | POA: Diagnosis not present

## 2018-10-27 DIAGNOSIS — Z794 Long term (current) use of insulin: Secondary | ICD-10-CM | POA: Diagnosis not present

## 2018-10-27 DIAGNOSIS — M79651 Pain in right thigh: Secondary | ICD-10-CM | POA: Diagnosis not present

## 2018-10-27 DIAGNOSIS — E1122 Type 2 diabetes mellitus with diabetic chronic kidney disease: Secondary | ICD-10-CM | POA: Diagnosis not present

## 2018-10-27 DIAGNOSIS — M79669 Pain in unspecified lower leg: Secondary | ICD-10-CM | POA: Diagnosis not present

## 2018-10-27 DIAGNOSIS — I132 Hypertensive heart and chronic kidney disease with heart failure and with stage 5 chronic kidney disease, or end stage renal disease: Secondary | ICD-10-CM | POA: Diagnosis not present

## 2018-10-27 DIAGNOSIS — Y999 Unspecified external cause status: Secondary | ICD-10-CM | POA: Insufficient documentation

## 2018-10-27 DIAGNOSIS — M79604 Pain in right leg: Secondary | ICD-10-CM

## 2018-10-27 DIAGNOSIS — Z7982 Long term (current) use of aspirin: Secondary | ICD-10-CM | POA: Diagnosis not present

## 2018-10-27 DIAGNOSIS — M79661 Pain in right lower leg: Secondary | ICD-10-CM | POA: Diagnosis not present

## 2018-10-27 DIAGNOSIS — Z951 Presence of aortocoronary bypass graft: Secondary | ICD-10-CM | POA: Insufficient documentation

## 2018-10-27 DIAGNOSIS — Y9389 Activity, other specified: Secondary | ICD-10-CM | POA: Diagnosis not present

## 2018-10-27 DIAGNOSIS — S79921A Unspecified injury of right thigh, initial encounter: Secondary | ICD-10-CM | POA: Diagnosis not present

## 2018-10-27 DIAGNOSIS — Y929 Unspecified place or not applicable: Secondary | ICD-10-CM | POA: Insufficient documentation

## 2018-10-27 DIAGNOSIS — N186 End stage renal disease: Secondary | ICD-10-CM | POA: Diagnosis not present

## 2018-10-27 LAB — CK: Total CK: 265 U/L — ABNORMAL HIGH (ref 38–234)

## 2018-10-27 LAB — BASIC METABOLIC PANEL
Anion gap: 15 (ref 5–15)
BUN: 62 mg/dL — ABNORMAL HIGH (ref 8–23)
CO2: 22 mmol/L (ref 22–32)
Calcium: 8.6 mg/dL — ABNORMAL LOW (ref 8.9–10.3)
Chloride: 98 mmol/L (ref 98–111)
Creatinine, Ser: 4.73 mg/dL — ABNORMAL HIGH (ref 0.44–1.00)
GFR calc Af Amer: 10 mL/min — ABNORMAL LOW (ref >60)
GFR calc non Af Amer: 9 mL/min — ABNORMAL LOW (ref >60)
Glucose, Bld: 82 mg/dL (ref 70–99)
Potassium: 4.8 mmol/L (ref 3.5–5.1)
Sodium: 135 mmol/L (ref 135–145)

## 2018-10-27 LAB — CBC
HCT: 37 % (ref 36.0–46.0)
Hemoglobin: 12 g/dL (ref 12.0–15.0)
MCH: 29.9 pg (ref 26.0–34.0)
MCHC: 32.4 g/dL (ref 30.0–36.0)
MCV: 92.3 fL (ref 80.0–100.0)
Platelets: 264 10*3/uL (ref 150–400)
RBC: 4.01 MIL/uL (ref 3.87–5.11)
RDW: 15.9 % — ABNORMAL HIGH (ref 11.5–15.5)
WBC: 12.1 10*3/uL — ABNORMAL HIGH (ref 4.0–10.5)
nRBC: 0 % (ref 0.0–0.2)

## 2018-10-27 MED ORDER — HYDROCODONE-ACETAMINOPHEN 5-325 MG PO TABS
1.0000 | ORAL_TABLET | ORAL | Status: AC
  Administered 2018-10-27: 1 via ORAL
  Filled 2018-10-27: qty 1

## 2018-10-27 MED ORDER — HYDROCODONE-ACETAMINOPHEN 5-325 MG PO TABS: 1.0000 | ORAL_TABLET | Freq: Four times a day (QID) | ORAL | 0 refills | Status: DC | PRN

## 2018-10-27 NOTE — ED Notes (Signed)
Patient able to walk with walker to doorway and back to bed with walker. Patient currently waiting on daughter to pick up when discharged.

## 2018-10-27 NOTE — ED Triage Notes (Addendum)
Pt to triage via w/c brought in by EMS; pt reports on Sunday, stood up and felt a "pop" to rt upper leg; c/o persistent pain since with wt bearing; pt reports missing dialysis yesterday due to pain

## 2018-10-27 NOTE — ED Notes (Signed)
Pt assisted to toilet 

## 2018-10-27 NOTE — ED Notes (Signed)
Pt laying in bed resting, multiple calls placed to daughter for ride home, pt will keep calling. Pt states no need for pain med at this time as she is okay laying in bed, will get pain med at home if needed. Alert and oriented, call light in reach, bed locked and low.
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
## 2018-10-27 NOTE — Discharge Instructions (Addendum)
Please follow-up closely with your dialysis center, please call them this morning to set up a time for your dialysis session today.  Return right away if you develop fever, increased pain, swelling, redness of the skin, cold or blue foot, or other new concerns arise.  No driving today or while using hydrocodone.

## 2018-10-28 ENCOUNTER — Other Ambulatory Visit: Admission: RE | Admit: 2018-10-28 | Payer: Medicare Other | Source: Ambulatory Visit

## 2018-10-28 DIAGNOSIS — N2581 Secondary hyperparathyroidism of renal origin: Secondary | ICD-10-CM | POA: Diagnosis not present

## 2018-10-28 DIAGNOSIS — D509 Iron deficiency anemia, unspecified: Secondary | ICD-10-CM | POA: Diagnosis not present

## 2018-10-28 DIAGNOSIS — N186 End stage renal disease: Secondary | ICD-10-CM | POA: Diagnosis not present

## 2018-10-28 DIAGNOSIS — Z992 Dependence on renal dialysis: Secondary | ICD-10-CM | POA: Diagnosis not present

## 2018-10-28 MED ORDER — CEFAZOLIN SODIUM-DEXTROSE 1-4 GM/50ML-% IV SOLN: 1.0000 g | INTRAVENOUS | Status: AC

## 2018-10-28 NOTE — Pre-Procedure Instructions (Signed)
Pt did not show up for Covid testing today. I called pt and left her a message to inform her that she needs to arrive to [facility] @ 1030 am tomorrow for Covid testing and then proceed to [facility] for her procedure

## 2018-10-29 ENCOUNTER — Encounter: Admission: RE | Admit: 2018-10-29 | Payer: Medicare Other | Source: Ambulatory Visit

## 2018-10-30 DIAGNOSIS — Z992 Dependence on renal dialysis: Secondary | ICD-10-CM | POA: Diagnosis not present

## 2018-10-30 DIAGNOSIS — N186 End stage renal disease: Secondary | ICD-10-CM | POA: Diagnosis not present

## 2018-10-30 DIAGNOSIS — N2581 Secondary hyperparathyroidism of renal origin: Secondary | ICD-10-CM | POA: Diagnosis not present

## 2018-10-30 DIAGNOSIS — D509 Iron deficiency anemia, unspecified: Secondary | ICD-10-CM | POA: Diagnosis not present

## 2018-11-02 DIAGNOSIS — N186 End stage renal disease: Secondary | ICD-10-CM | POA: Diagnosis not present

## 2018-11-02 DIAGNOSIS — Z992 Dependence on renal dialysis: Secondary | ICD-10-CM | POA: Diagnosis not present

## 2018-11-02 DIAGNOSIS — N2581 Secondary hyperparathyroidism of renal origin: Secondary | ICD-10-CM | POA: Diagnosis not present

## 2018-11-02 DIAGNOSIS — D509 Iron deficiency anemia, unspecified: Secondary | ICD-10-CM | POA: Diagnosis not present

## 2018-11-04 ENCOUNTER — Encounter: Admission: RE | Admit: 2018-11-04 | Payer: Medicare Other | Source: Ambulatory Visit

## 2018-11-04 DIAGNOSIS — Z992 Dependence on renal dialysis: Secondary | ICD-10-CM | POA: Diagnosis not present

## 2018-11-04 DIAGNOSIS — N2581 Secondary hyperparathyroidism of renal origin: Secondary | ICD-10-CM | POA: Diagnosis not present

## 2018-11-04 DIAGNOSIS — D509 Iron deficiency anemia, unspecified: Secondary | ICD-10-CM | POA: Diagnosis not present

## 2018-11-04 DIAGNOSIS — N186 End stage renal disease: Secondary | ICD-10-CM | POA: Diagnosis not present

## 2018-11-05 ENCOUNTER — Encounter (INDEPENDENT_AMBULATORY_CARE_PROVIDER_SITE_OTHER): Payer: Self-pay

## 2018-11-05 MED ORDER — MIDAZOLAM HCL 2 MG/2ML IJ SOLN
INTRAMUSCULAR | Status: AC
  Filled 2018-11-05: qty 2

## 2018-11-05 MED ORDER — FENTANYL CITRATE (PF) 100 MCG/2ML IJ SOLN
INTRAMUSCULAR | Status: AC
  Filled 2018-11-05: qty 2

## 2018-11-05 MED ORDER — PROPOFOL 10 MG/ML IV BOLUS
INTRAVENOUS | Status: AC
  Filled 2018-11-05: qty 20

## 2018-11-06 DIAGNOSIS — N2581 Secondary hyperparathyroidism of renal origin: Secondary | ICD-10-CM | POA: Diagnosis not present

## 2018-11-06 DIAGNOSIS — N186 End stage renal disease: Secondary | ICD-10-CM | POA: Diagnosis not present

## 2018-11-06 DIAGNOSIS — D509 Iron deficiency anemia, unspecified: Secondary | ICD-10-CM | POA: Diagnosis not present

## 2018-11-06 DIAGNOSIS — Z992 Dependence on renal dialysis: Secondary | ICD-10-CM | POA: Diagnosis not present

## 2018-11-08 DIAGNOSIS — N186 End stage renal disease: Secondary | ICD-10-CM | POA: Diagnosis not present

## 2018-11-08 DIAGNOSIS — Z992 Dependence on renal dialysis: Secondary | ICD-10-CM | POA: Diagnosis not present

## 2018-11-11 DIAGNOSIS — D631 Anemia in chronic kidney disease: Secondary | ICD-10-CM | POA: Diagnosis not present

## 2018-11-11 DIAGNOSIS — D509 Iron deficiency anemia, unspecified: Secondary | ICD-10-CM | POA: Diagnosis not present

## 2018-11-11 DIAGNOSIS — N2581 Secondary hyperparathyroidism of renal origin: Secondary | ICD-10-CM | POA: Diagnosis not present

## 2018-11-11 DIAGNOSIS — N186 End stage renal disease: Secondary | ICD-10-CM | POA: Diagnosis not present

## 2018-11-11 DIAGNOSIS — Z992 Dependence on renal dialysis: Secondary | ICD-10-CM | POA: Diagnosis not present

## 2018-11-13 DIAGNOSIS — Z992 Dependence on renal dialysis: Secondary | ICD-10-CM | POA: Diagnosis not present

## 2018-11-13 DIAGNOSIS — D509 Iron deficiency anemia, unspecified: Secondary | ICD-10-CM | POA: Diagnosis not present

## 2018-11-13 DIAGNOSIS — N2581 Secondary hyperparathyroidism of renal origin: Secondary | ICD-10-CM | POA: Diagnosis not present

## 2018-11-13 DIAGNOSIS — N186 End stage renal disease: Secondary | ICD-10-CM | POA: Diagnosis not present

## 2018-11-13 DIAGNOSIS — D631 Anemia in chronic kidney disease: Secondary | ICD-10-CM | POA: Diagnosis not present

## 2018-11-16 DIAGNOSIS — Z992 Dependence on renal dialysis: Secondary | ICD-10-CM | POA: Diagnosis not present

## 2018-11-16 DIAGNOSIS — N186 End stage renal disease: Secondary | ICD-10-CM | POA: Diagnosis not present

## 2018-11-16 DIAGNOSIS — D509 Iron deficiency anemia, unspecified: Secondary | ICD-10-CM | POA: Diagnosis not present

## 2018-11-16 DIAGNOSIS — N2581 Secondary hyperparathyroidism of renal origin: Secondary | ICD-10-CM | POA: Diagnosis not present

## 2018-11-16 DIAGNOSIS — D631 Anemia in chronic kidney disease: Secondary | ICD-10-CM | POA: Diagnosis not present

## 2018-11-18 ENCOUNTER — Other Ambulatory Visit
Admission: RE | Admit: 2018-11-18 | Discharge: 2018-11-18 | Disposition: A | Discharge status: Home / Self Care | Payer: Medicare Other | Source: Ambulatory Visit | Attending: Vascular Surgery | Admitting: Vascular Surgery

## 2018-11-18 ENCOUNTER — Other Ambulatory Visit: Payer: Self-pay

## 2018-11-18 DIAGNOSIS — Z794 Long term (current) use of insulin: Secondary | ICD-10-CM | POA: Diagnosis not present

## 2018-11-18 DIAGNOSIS — Z01812 Encounter for preprocedural laboratory examination: Secondary | ICD-10-CM | POA: Insufficient documentation

## 2018-11-18 DIAGNOSIS — M79661 Pain in right lower leg: Secondary | ICD-10-CM | POA: Diagnosis not present

## 2018-11-18 DIAGNOSIS — N186 End stage renal disease: Secondary | ICD-10-CM | POA: Diagnosis not present

## 2018-11-18 DIAGNOSIS — Z20828 Contact with and (suspected) exposure to other viral communicable diseases: Secondary | ICD-10-CM | POA: Diagnosis not present

## 2018-11-18 DIAGNOSIS — I110 Hypertensive heart disease with heart failure: Secondary | ICD-10-CM | POA: Diagnosis not present

## 2018-11-18 DIAGNOSIS — D631 Anemia in chronic kidney disease: Secondary | ICD-10-CM | POA: Diagnosis not present

## 2018-11-18 DIAGNOSIS — S0990XA Unspecified injury of head, initial encounter: Secondary | ICD-10-CM | POA: Diagnosis not present

## 2018-11-18 DIAGNOSIS — Z1159 Encounter for screening for other viral diseases: Secondary | ICD-10-CM | POA: Insufficient documentation

## 2018-11-18 DIAGNOSIS — D509 Iron deficiency anemia, unspecified: Secondary | ICD-10-CM | POA: Diagnosis not present

## 2018-11-18 DIAGNOSIS — J9811 Atelectasis: Secondary | ICD-10-CM | POA: Diagnosis not present

## 2018-11-18 DIAGNOSIS — I5023 Acute on chronic systolic (congestive) heart failure: Secondary | ICD-10-CM | POA: Diagnosis not present

## 2018-11-18 DIAGNOSIS — J9601 Acute respiratory failure with hypoxia: Secondary | ICD-10-CM | POA: Diagnosis not present

## 2018-11-18 DIAGNOSIS — Z992 Dependence on renal dialysis: Secondary | ICD-10-CM | POA: Diagnosis not present

## 2018-11-18 DIAGNOSIS — I132 Hypertensive heart and chronic kidney disease with heart failure and with stage 5 chronic kidney disease, or end stage renal disease: Secondary | ICD-10-CM | POA: Diagnosis not present

## 2018-11-18 DIAGNOSIS — N2581 Secondary hyperparathyroidism of renal origin: Secondary | ICD-10-CM | POA: Diagnosis not present

## 2018-11-18 DIAGNOSIS — J90 Pleural effusion, not elsewhere classified: Secondary | ICD-10-CM | POA: Diagnosis not present

## 2018-11-18 DIAGNOSIS — T82898A Other specified complication of vascular prosthetic devices, implants and grafts, initial encounter: Secondary | ICD-10-CM | POA: Diagnosis not present

## 2018-11-18 DIAGNOSIS — E109 Type 1 diabetes mellitus without complications: Secondary | ICD-10-CM | POA: Diagnosis not present

## 2018-11-18 DIAGNOSIS — I509 Heart failure, unspecified: Secondary | ICD-10-CM | POA: Diagnosis not present

## 2018-11-18 LAB — SARS CORONAVIRUS 2 BY RT PCR (HOSPITAL ORDER, PERFORMED IN ~~LOC~~ HOSPITAL LAB): SARS Coronavirus 2: NEGATIVE

## 2018-11-19 ENCOUNTER — Other Ambulatory Visit: Payer: Self-pay

## 2018-11-19 ENCOUNTER — Ambulatory Visit (HOSPITAL_BASED_OUTPATIENT_CLINIC_OR_DEPARTMENT_OTHER)
Admission: RE | Admit: 2018-11-19 | Discharge: 2018-11-19 | Disposition: A | Discharge status: Home / Self Care | Payer: Medicare Other | Source: Home / Self Care | Attending: Vascular Surgery | Admitting: Vascular Surgery

## 2018-11-19 ENCOUNTER — Inpatient Hospital Stay
Admission: EM | Admit: 2018-11-19 | Discharge: 2018-11-21 | DRG: 939 | Disposition: A | Discharge status: Home Health | Payer: Medicare Other | Attending: Internal Medicine | Admitting: Internal Medicine

## 2018-11-19 ENCOUNTER — Ambulatory Visit: Payer: Medicare Other | Admitting: Anesthesiology

## 2018-11-19 ENCOUNTER — Encounter: Payer: Self-pay | Admitting: *Deleted

## 2018-11-19 ENCOUNTER — Encounter
Admission: RE | Disposition: A | Discharge status: Home / Self Care | Payer: Self-pay | Source: Home / Self Care | Attending: Vascular Surgery

## 2018-11-19 ENCOUNTER — Ambulatory Visit: Payer: Medicare Other | Admitting: Cardiovascular Disease

## 2018-11-19 DIAGNOSIS — I120 Hypertensive chronic kidney disease with stage 5 chronic kidney disease or end stage renal disease: Secondary | ICD-10-CM | POA: Diagnosis not present

## 2018-11-19 DIAGNOSIS — Z992 Dependence on renal dialysis: Secondary | ICD-10-CM

## 2018-11-19 DIAGNOSIS — J9601 Acute respiratory failure with hypoxia: Secondary | ICD-10-CM | POA: Diagnosis not present

## 2018-11-19 DIAGNOSIS — I110 Hypertensive heart disease with heart failure: Secondary | ICD-10-CM | POA: Diagnosis not present

## 2018-11-19 DIAGNOSIS — I255 Ischemic cardiomyopathy: Secondary | ICD-10-CM | POA: Diagnosis present

## 2018-11-19 DIAGNOSIS — N186 End stage renal disease: Secondary | ICD-10-CM | POA: Diagnosis not present

## 2018-11-19 DIAGNOSIS — Z955 Presence of coronary angioplasty implant and graft: Secondary | ICD-10-CM

## 2018-11-19 DIAGNOSIS — E785 Hyperlipidemia, unspecified: Secondary | ICD-10-CM | POA: Diagnosis not present

## 2018-11-19 DIAGNOSIS — J90 Pleural effusion, not elsewhere classified: Secondary | ICD-10-CM | POA: Diagnosis present

## 2018-11-19 DIAGNOSIS — Z8042 Family history of malignant neoplasm of prostate: Secondary | ICD-10-CM

## 2018-11-19 DIAGNOSIS — I132 Hypertensive heart and chronic kidney disease with heart failure and with stage 5 chronic kidney disease, or end stage renal disease: Secondary | ICD-10-CM | POA: Diagnosis not present

## 2018-11-19 DIAGNOSIS — I2510 Atherosclerotic heart disease of native coronary artery without angina pectoris: Secondary | ICD-10-CM | POA: Diagnosis present

## 2018-11-19 DIAGNOSIS — Z8249 Family history of ischemic heart disease and other diseases of the circulatory system: Secondary | ICD-10-CM

## 2018-11-19 DIAGNOSIS — S59911A Unspecified injury of right forearm, initial encounter: Secondary | ICD-10-CM | POA: Diagnosis not present

## 2018-11-19 DIAGNOSIS — R7989 Other specified abnormal findings of blood chemistry: Secondary | ICD-10-CM

## 2018-11-19 DIAGNOSIS — J9811 Atelectasis: Secondary | ICD-10-CM | POA: Diagnosis not present

## 2018-11-19 DIAGNOSIS — I5023 Acute on chronic systolic (congestive) heart failure: Secondary | ICD-10-CM | POA: Diagnosis not present

## 2018-11-19 DIAGNOSIS — E1165 Type 2 diabetes mellitus with hyperglycemia: Secondary | ICD-10-CM | POA: Diagnosis present

## 2018-11-19 DIAGNOSIS — Z7982 Long term (current) use of aspirin: Secondary | ICD-10-CM

## 2018-11-19 DIAGNOSIS — R197 Diarrhea, unspecified: Secondary | ICD-10-CM | POA: Diagnosis present

## 2018-11-19 DIAGNOSIS — I509 Heart failure, unspecified: Secondary | ICD-10-CM

## 2018-11-19 DIAGNOSIS — I420 Dilated cardiomyopathy: Secondary | ICD-10-CM | POA: Diagnosis present

## 2018-11-19 DIAGNOSIS — T82898A Other specified complication of vascular prosthetic devices, implants and grafts, initial encounter: Secondary | ICD-10-CM

## 2018-11-19 DIAGNOSIS — N2581 Secondary hyperparathyroidism of renal origin: Secondary | ICD-10-CM | POA: Diagnosis present

## 2018-11-19 DIAGNOSIS — W19XXXA Unspecified fall, initial encounter: Secondary | ICD-10-CM | POA: Diagnosis not present

## 2018-11-19 DIAGNOSIS — Z841 Family history of disorders of kidney and ureter: Secondary | ICD-10-CM

## 2018-11-19 DIAGNOSIS — E1122 Type 2 diabetes mellitus with diabetic chronic kidney disease: Secondary | ICD-10-CM | POA: Diagnosis present

## 2018-11-19 DIAGNOSIS — M79661 Pain in right lower leg: Secondary | ICD-10-CM | POA: Diagnosis not present

## 2018-11-19 DIAGNOSIS — D631 Anemia in chronic kidney disease: Secondary | ICD-10-CM | POA: Diagnosis present

## 2018-11-19 DIAGNOSIS — Z951 Presence of aortocoronary bypass graft: Secondary | ICD-10-CM

## 2018-11-19 DIAGNOSIS — S0990XA Unspecified injury of head, initial encounter: Secondary | ICD-10-CM | POA: Diagnosis not present

## 2018-11-19 DIAGNOSIS — Z833 Family history of diabetes mellitus: Secondary | ICD-10-CM

## 2018-11-19 DIAGNOSIS — Z794 Long term (current) use of insulin: Secondary | ICD-10-CM

## 2018-11-19 DIAGNOSIS — I5022 Chronic systolic (congestive) heart failure: Secondary | ICD-10-CM | POA: Diagnosis not present

## 2018-11-19 DIAGNOSIS — I272 Pulmonary hypertension, unspecified: Secondary | ICD-10-CM | POA: Diagnosis present

## 2018-11-19 DIAGNOSIS — I480 Paroxysmal atrial fibrillation: Secondary | ICD-10-CM | POA: Diagnosis present

## 2018-11-19 DIAGNOSIS — I252 Old myocardial infarction: Secondary | ICD-10-CM

## 2018-11-19 DIAGNOSIS — Z20828 Contact with and (suspected) exposure to other viral communicable diseases: Principal | ICD-10-CM | POA: Diagnosis present

## 2018-11-19 DIAGNOSIS — Z79899 Other long term (current) drug therapy: Secondary | ICD-10-CM

## 2018-11-19 DIAGNOSIS — R778 Other specified abnormalities of plasma proteins: Secondary | ICD-10-CM

## 2018-11-19 HISTORY — PX: LIGATION OF ARTERIOVENOUS  FISTULA: SHX5948

## 2018-11-19 HISTORY — PX: CAPD INSERTION: SHX5233

## 2018-11-19 LAB — GLUCOSE, CAPILLARY
Glucose-Capillary: 294 mg/dL — ABNORMAL HIGH (ref 70–99)
Glucose-Capillary: 212 mg/dL — ABNORMAL HIGH (ref 70–99)

## 2018-11-19 LAB — TYPE AND SCREEN
ABO/RH(D): O POS
Antibody Screen: NEGATIVE

## 2018-11-19 LAB — POCT I-STAT 4, (NA,K, GLUC, HGB,HCT)
Glucose, Bld: 319 mg/dL — ABNORMAL HIGH (ref 70–99)
HCT: 38 % (ref 36.0–46.0)
Hemoglobin: 12.9 g/dL (ref 12.0–15.0)
Potassium: 4.2 mmol/L (ref 3.5–5.1)
Sodium: 131 mmol/L — ABNORMAL LOW (ref 135–145)

## 2018-11-19 SURGERY — LAPAROSCOPIC INSERTION CONTINUOUS AMBULATORY PERITONEAL DIALYSIS  (CAPD) CATHETER
Anesthesia: General | Site: Abdomen

## 2018-11-19 MED ORDER — INSULIN ASPART 100 UNIT/ML ~~LOC~~ SOLN
8.0000 [IU] | Freq: Once | SUBCUTANEOUS | Status: AC
  Administered 2018-11-19: 8 [IU] via SUBCUTANEOUS

## 2018-11-19 MED ORDER — HYDRALAZINE HCL 20 MG/ML IJ SOLN
INTRAMUSCULAR | Status: AC
  Administered 2018-11-19: 18:00:00 10 mg via INTRAVENOUS
  Filled 2018-11-19: qty 1

## 2018-11-19 MED ORDER — HYDROMORPHONE HCL 1 MG/ML IJ SOLN: 1.0000 mg | Freq: Once | INTRAMUSCULAR | Status: DC | PRN

## 2018-11-19 MED ORDER — OXYCODONE HCL 5 MG PO TABS: 5.0000 mg | ORAL_TABLET | Freq: Once | ORAL | Status: DC | PRN

## 2018-11-19 MED ORDER — ONDANSETRON HCL 4 MG/2ML IJ SOLN
INTRAMUSCULAR | Status: AC
  Filled 2018-11-19: qty 2

## 2018-11-19 MED ORDER — MIDAZOLAM HCL 2 MG/2ML IJ SOLN
INTRAMUSCULAR | Status: DC | PRN
  Administered 2018-11-19: 2 mg via INTRAVENOUS

## 2018-11-19 MED ORDER — OXYCODONE HCL 5 MG/5ML PO SOLN: 5.0000 mg | Freq: Once | ORAL | Status: DC | PRN

## 2018-11-19 MED ORDER — PROPOFOL 10 MG/ML IV BOLUS
INTRAVENOUS | Status: DC | PRN
  Administered 2018-11-19: 20 mg via INTRAVENOUS
  Administered 2018-11-19: 140 mg via INTRAVENOUS

## 2018-11-19 MED ORDER — NEOSTIGMINE METHYLSULFATE 10 MG/10ML IV SOLN
INTRAVENOUS | Status: DC | PRN
  Administered 2018-11-19: 3 mg via INTRAVENOUS

## 2018-11-19 MED ORDER — DEXAMETHASONE SODIUM PHOSPHATE 10 MG/ML IJ SOLN
INTRAMUSCULAR | Status: AC
  Filled 2018-11-19: qty 1

## 2018-11-19 MED ORDER — FENTANYL CITRATE (PF) 100 MCG/2ML IJ SOLN
INTRAMUSCULAR | Status: AC
  Filled 2018-11-19: qty 2

## 2018-11-19 MED ORDER — SUCCINYLCHOLINE CHLORIDE 20 MG/ML IJ SOLN
INTRAMUSCULAR | Status: DC | PRN
  Administered 2018-11-19: 100 mg via INTRAVENOUS

## 2018-11-19 MED ORDER — CEFAZOLIN SODIUM-DEXTROSE 1-4 GM/50ML-% IV SOLN
INTRAVENOUS | Status: AC
  Filled 2018-11-19: qty 50

## 2018-11-19 MED ORDER — GLYCOPYRROLATE 0.2 MG/ML IJ SOLN
INTRAMUSCULAR | Status: DC | PRN
  Administered 2018-11-19: 0.2 mg via INTRAVENOUS
  Administered 2018-11-19: 0.6 mg via INTRAVENOUS

## 2018-11-19 MED ORDER — CHLORHEXIDINE GLUCONATE CLOTH 2 % EX PADS
6.0000 | MEDICATED_PAD | Freq: Once | CUTANEOUS | Status: AC
  Administered 2018-11-19: 15:00:00 6 via TOPICAL

## 2018-11-19 MED ORDER — INSULIN ASPART 100 UNIT/ML ~~LOC~~ SOLN
SUBCUTANEOUS | Status: AC
  Administered 2018-11-19: 15:00:00 8 [IU] via SUBCUTANEOUS
  Filled 2018-11-19: qty 1

## 2018-11-19 MED ORDER — DEXAMETHASONE SODIUM PHOSPHATE 10 MG/ML IJ SOLN
INTRAMUSCULAR | Status: DC | PRN
  Administered 2018-11-19: 4 mg via INTRAVENOUS

## 2018-11-19 MED ORDER — CEFAZOLIN SODIUM-DEXTROSE 1-4 GM/50ML-% IV SOLN
INTRAVENOUS | Status: DC | PRN
  Administered 2018-11-19: 1 g via INTRAVENOUS

## 2018-11-19 MED ORDER — MIDAZOLAM HCL 2 MG/2ML IJ SOLN
INTRAMUSCULAR | Status: AC
  Filled 2018-11-19: qty 2

## 2018-11-19 MED ORDER — PHENYLEPHRINE HCL (PRESSORS) 10 MG/ML IV SOLN
INTRAVENOUS | Status: AC
  Filled 2018-11-19: qty 1

## 2018-11-19 MED ORDER — SODIUM CHLORIDE 0.9 % IV SOLN
INTRAVENOUS | Status: DC
  Administered 2018-11-19 (×2): via INTRAVENOUS

## 2018-11-19 MED ORDER — BACITRACIN ZINC 500 UNIT/GM EX OINT
TOPICAL_OINTMENT | CUTANEOUS | Status: AC
  Filled 2018-11-19: qty 28.35

## 2018-11-19 MED ORDER — ONDANSETRON HCL 4 MG/2ML IJ SOLN: 4.0000 mg | Freq: Four times a day (QID) | INTRAMUSCULAR | Status: DC | PRN

## 2018-11-19 MED ORDER — CHLORHEXIDINE GLUCONATE CLOTH 2 % EX PADS: 6.0000 | MEDICATED_PAD | Freq: Once | CUTANEOUS | Status: DC

## 2018-11-19 MED ORDER — FAMOTIDINE 20 MG PO TABS
ORAL_TABLET | ORAL | Status: AC
  Administered 2018-11-19: 15:00:00 20 mg via ORAL
  Filled 2018-11-19: qty 1

## 2018-11-19 MED ORDER — FENTANYL CITRATE (PF) 100 MCG/2ML IJ SOLN
INTRAMUSCULAR | Status: DC | PRN
  Administered 2018-11-19 (×2): 50 ug via INTRAVENOUS

## 2018-11-19 MED ORDER — ROCURONIUM BROMIDE 100 MG/10ML IV SOLN
INTRAVENOUS | Status: DC | PRN
  Administered 2018-11-19: 10 mg via INTRAVENOUS
  Administered 2018-11-19: 5 mg via INTRAVENOUS
  Administered 2018-11-19: 15 mg via INTRAVENOUS

## 2018-11-19 MED ORDER — PHENYLEPHRINE HCL (PRESSORS) 10 MG/ML IV SOLN
INTRAVENOUS | Status: DC | PRN
  Administered 2018-11-19 (×2): 100 ug via INTRAVENOUS

## 2018-11-19 MED ORDER — LIDOCAINE HCL (CARDIAC) PF 100 MG/5ML IV SOSY
PREFILLED_SYRINGE | INTRAVENOUS | Status: DC | PRN
  Administered 2018-11-19: 50 mg via INTRAVENOUS

## 2018-11-19 MED ORDER — FAMOTIDINE 20 MG PO TABS
20.0000 mg | ORAL_TABLET | Freq: Once | ORAL | Status: AC
  Administered 2018-11-19: 20 mg via ORAL

## 2018-11-19 MED ORDER — HYDROCODONE-ACETAMINOPHEN 5-325 MG PO TABS: 1.0000 | ORAL_TABLET | Freq: Four times a day (QID) | ORAL | 0 refills | Status: AC | PRN

## 2018-11-19 MED ORDER — ONDANSETRON HCL 4 MG/2ML IJ SOLN
INTRAMUSCULAR | Status: DC | PRN
  Administered 2018-11-19: 4 mg via INTRAVENOUS

## 2018-11-19 MED ORDER — FENTANYL CITRATE (PF) 100 MCG/2ML IJ SOLN: 25.0000 ug | INTRAMUSCULAR | Status: DC | PRN

## 2018-11-19 MED ORDER — HYDRALAZINE HCL 20 MG/ML IJ SOLN
10.0000 mg | Freq: Once | INTRAMUSCULAR | Status: AC
  Administered 2018-11-19: 18:00:00 10 mg via INTRAVENOUS

## 2018-11-19 SURGICAL SUPPLY — 70 items
ADAPTER BETA CAP QUINTON DIALY (ADAPTER) IMPLANT
ADAPTER CATH DIALYSIS 18.75 (CATHETERS) ×6 IMPLANT
ADAPTER CATH DIALYSIS 18.75CM (CATHETERS) ×2
BAG DECANTER FOR FLEXI CONT (MISCELLANEOUS) ×4 IMPLANT
BLADE SURG SZ11 CARB STEEL (BLADE) ×4 IMPLANT
BOOT SUTURE AID YELLOW STND (SUTURE) ×4 IMPLANT
CANISTER SUCT 1200ML W/VALVE (MISCELLANEOUS) ×4 IMPLANT
CATH DLYS SWAN NECK 62.5CM (CATHETERS) ×8 IMPLANT
CHLORAPREP W/TINT 26 (MISCELLANEOUS) ×4 IMPLANT
CLIP SPRNG 6MM S-JAW DBL (CLIP) ×4
COVER WAND RF STERILE (DRAPES) ×4 IMPLANT
DERMABOND ADVANCED (GAUZE/BANDAGES/DRESSINGS) ×4
DERMABOND ADVANCED .7 DNX12 (GAUZE/BANDAGES/DRESSINGS) ×4 IMPLANT
DRSG TEGADERM 4X4.75 (GAUZE/BANDAGES/DRESSINGS) ×8 IMPLANT
ELECT CAUTERY BLADE 6.4 (BLADE) ×4 IMPLANT
ELECT REM PT RETURN 9FT ADLT (ELECTROSURGICAL) ×4
ELECTRODE REM PT RTRN 9FT ADLT (ELECTROSURGICAL) ×2 IMPLANT
GLOVE BIO SURGEON STRL SZ 6.5 (GLOVE) ×3 IMPLANT
GLOVE BIO SURGEON STRL SZ7 (GLOVE) ×8 IMPLANT
GLOVE BIO SURGEONS STRL SZ 6.5 (GLOVE) ×1
GLOVE INDICATOR 6.5 STRL GRN (GLOVE) ×4 IMPLANT
GLOVE INDICATOR 7.5 STRL GRN (GLOVE) ×4 IMPLANT
GOWN STRL REUS W/ TWL LRG LVL3 (GOWN DISPOSABLE) ×4 IMPLANT
GOWN STRL REUS W/ TWL XL LVL3 (GOWN DISPOSABLE) ×4 IMPLANT
GOWN STRL REUS W/TWL LRG LVL3 (GOWN DISPOSABLE) ×4
GOWN STRL REUS W/TWL XL LVL3 (GOWN DISPOSABLE) ×4
HEMOSTAT SURGICEL 2X3 (HEMOSTASIS) ×12 IMPLANT
IV NS 500ML (IV SOLUTION) ×2
IV NS 500ML BAXH (IV SOLUTION) ×2 IMPLANT
KIT TURNOVER KIT A (KITS) ×4 IMPLANT
LABEL OR SOLS (LABEL) ×4 IMPLANT
LOOP RED MAXI  1X406MM (MISCELLANEOUS) ×2
LOOP VESSEL MAXI 1X406 RED (MISCELLANEOUS) ×2 IMPLANT
LOOP VESSEL MINI 0.8X406 BLUE (MISCELLANEOUS) ×2 IMPLANT
LOOPS BLUE MINI 0.8X406MM (MISCELLANEOUS) ×2
MINICAP W/POVIDONE IODINE SOL (MISCELLANEOUS) ×4 IMPLANT
NEEDLE FILTER BLUNT 18X 1/2SAF (NEEDLE) ×2
NEEDLE FILTER BLUNT 18X1 1/2 (NEEDLE) ×2 IMPLANT
NS IRRIG 500ML POUR BTL (IV SOLUTION) ×4 IMPLANT
PACK EXTREMITY ARMC (MISCELLANEOUS) ×4 IMPLANT
PACK LAP CHOLECYSTECTOMY (MISCELLANEOUS) ×4 IMPLANT
PAD PREP 24X41 OB/GYN DISP (PERSONAL CARE ITEMS) ×4 IMPLANT
PENCIL ELECTRO HAND CTR (MISCELLANEOUS) ×4 IMPLANT
SET CYSTO W/LG BORE CLAMP LF (SET/KITS/TRAYS/PACK) ×4 IMPLANT
SET TRANSFER 6 W/TWIST CLAMP 5 (SET/KITS/TRAYS/PACK) ×4 IMPLANT
SET TUBE SMOKE EVAC HIGH FLOW (TUBING) ×4 IMPLANT
SOLUTION CELL SAVER (CLIP) ×2 IMPLANT
SPONGE DRAIN TRACH 4X4 STRL 2S (GAUZE/BANDAGES/DRESSINGS) ×8 IMPLANT
STOCKINETTE STRL 4IN 9604848 (GAUZE/BANDAGES/DRESSINGS) ×4 IMPLANT
STRAP SAFETY 5IN WIDE (MISCELLANEOUS) ×4 IMPLANT
SUT MNCRL 4-0 (SUTURE) ×2
SUT MNCRL 4-0 27XMFL (SUTURE) ×2
SUT MNCRL AB 4-0 PS2 18 (SUTURE) ×4 IMPLANT
SUT PROLENE 6 0 BV (SUTURE) ×4 IMPLANT
SUT SILK 2 0 (SUTURE) ×2
SUT SILK 2 0 SH (SUTURE) ×4 IMPLANT
SUT SILK 2-0 18XBRD TIE 12 (SUTURE) ×2 IMPLANT
SUT SILK 3 0 (SUTURE) ×2
SUT SILK 3-0 18XBRD TIE 12 (SUTURE) ×2 IMPLANT
SUT SILK 4 0 (SUTURE) ×2
SUT SILK 4-0 18XBRD TIE 12 (SUTURE) ×2 IMPLANT
SUT VIC AB 2-0 UR6 27 (SUTURE) ×4 IMPLANT
SUT VIC AB 3-0 SH 27 (SUTURE) ×2
SUT VIC AB 3-0 SH 27X BRD (SUTURE) ×2 IMPLANT
SUT VICRYL+ 3-0 36IN CT-1 (SUTURE) ×4 IMPLANT
SUTURE MNCRL 4-0 27XMF (SUTURE) ×2 IMPLANT
SYR 20CC LL (SYRINGE) ×4 IMPLANT
SYR 3ML LL SCALE MARK (SYRINGE) ×4 IMPLANT
TROCAR XCEL NON-BLD 11X100MML (ENDOMECHANICALS) ×4 IMPLANT
TROCAR XCEL NON-BLD 5MMX100MML (ENDOMECHANICALS) IMPLANT

## 2018-11-19 NOTE — Anesthesia Procedure Notes (Signed)
Procedure Name: Intubation Performed by: Tiffannie Sloss, CRNA Pre-anesthesia Checklist: Patient identified, Patient being monitored, Timeout performed, Emergency Drugs available and Suction available Patient Re-evaluated:Patient Re-evaluated prior to induction Oxygen Delivery Method: Circle system utilized Preoxygenation: Pre-oxygenation with 100% oxygen Induction Type: IV induction Ventilation: Mask ventilation without difficulty Laryngoscope Size: 3 and McGraph Grade View: Grade I Tube type: Oral Tube size: 7.0 mm Number of attempts: 1 Airway Equipment and Method: Stylet and Video-laryngoscopy Placement Confirmation: ETT inserted through vocal cords under direct vision,  positive ETCO2 and breath sounds checked- equal and bilateral Secured at: 21 cm Tube secured with: Tape Dental Injury: Teeth and Oropharynx as per pre-operative assessment        

## 2018-11-19 NOTE — OR Nursing (Signed)
Daughter just called and informed me that her mother fell upon arrival at home.  No apparent injuries are noted.  Paramedics are there now checking her out.  I told her to let them review her discharge instructions and if they had any questions they could give me a call.
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
## 2018-11-19 NOTE — Discharge Instructions (Signed)
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
## 2018-11-19 NOTE — Transfer of Care (Signed)
Immediate Anesthesia Transfer of Care Note  Patient: Theresa Rogers  Procedure(s) Performed: LAPAROSCOPIC INSERTION CONTINUOUS AMBULATORY PERITONEAL DIALYSIS  (CAPD) CATHETER (N/A Abdomen) LIGATION OF ARTERIOVENOUS  FISTULA (Left )  Patient Location: PACU  Anesthesia Type:General  Level of Consciousness: sedated  Airway & Oxygen Therapy: Patient Spontanous Breathing and Patient connected to face mask oxygen  Post-op Assessment: Report given to RN and Post -op Vital signs reviewed and stable  Post vital signs: Reviewed  Last Vitals:  Vitals Value Taken Time  BP 92/69 11/19/18 1722  Temp 36.1 C 11/19/18 1722  Pulse 66 11/19/18 1722  Resp 23 11/19/18 1722  SpO2 93 % 11/19/18 1722  Vitals shown include unvalidated device data.  Last Pain:  Vitals:   11/19/18 1436  TempSrc: Temporal  PainSc: 5          Complications: No apparent anesthesia complications

## 2018-11-19 NOTE — Progress Notes (Signed)
Pt BP elevated. Dr. Amie Critchley notified. Acknowledged. Orders received.

## 2018-11-19 NOTE — Anesthesia Postprocedure Evaluation (Signed)
Anesthesia Post Note  Patient: Theresa Rogers  Procedure(s) Performed: LAPAROSCOPIC INSERTION CONTINUOUS AMBULATORY PERITONEAL DIALYSIS  (CAPD) CATHETER (N/A Abdomen) LIGATION OF ARTERIOVENOUS  FISTULA (Left )  Patient location during evaluation: PACU Anesthesia Type: General Level of consciousness: awake and alert Pain management: pain level controlled Vital Signs Assessment: post-procedure vital signs reviewed and stable Respiratory status: spontaneous breathing, nonlabored ventilation, respiratory function stable and patient connected to nasal cannula oxygen Cardiovascular status: blood pressure returned to baseline and stable Postop Assessment: no apparent nausea or vomiting Anesthetic complications: no     Last Vitals:  Vitals:   11/19/18 1813 11/19/18 1818  BP: (!) 165/105 (!) 162/95  Pulse:  81  Resp:  18  Temp:    SpO2:  90%    Last Pain:  Vitals:   11/19/18 1818  TempSrc:   PainSc: 0-No pain                 Precious Haws [place]
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
## 2018-11-19 NOTE — Op Note (Signed)
  OPERATIVE NOTE   PROCEDURE: 1. Laparoscopic peritoneal dialysis catheter placement.  PRE-OPERATIVE DIAGNOSIS: 1.  ESRD transitioning to peritoneal dialysis 2.  Steal syndrome left arm  POST-OPERATIVE DIAGNOSIS: Same  SURGEON: Leotis Pain, MD  ASSISTANT(S): Hezzie Bump, PA-C  ANESTHESIA: general  ESTIMATED BLOOD LOSS: 3 cc  FINDING(S): 1. some preexisting ascites  SPECIMEN(S): None  INDICATIONS:  Patient presents with renal failure and desires transitioning to PD after steal syndrome in the left arm. Risks and benefits of placement were discussed and he is agreeable to proceed.  Differences between peritoneal dialysis and hemodialysis were discussed.  An assistant was present during the procedure to help facilitate the exposure and expedite the procedure.   DESCRIPTION: After obtaining full informed written consent, the patient was brought back to the operating room and placed supine upon the operating table. The patient received IV antibiotics prior to induction. After obtaining adequate anesthesia, the abdomen was prepped and draped in the standard fashion. The assistant provided retraction and mobilization to help facilitate exposure and expedite the procedure throughout the entire procedure.  This included following suture, using retractors, and optimizing lighting. A small transverse incision was created just to the left of the umbilicus and we dissected down to the fascia and placed a pursestring Vicryl suture. I then entered the peritoneum with an 15mm Optiview trocar placed in the right upper quadrant and insufflated the abdomen with carbon dioxide. I then entered the peritoneum just beside the umbilicus with a trocar and the peritoneal dialysis catheter under direct visualization. The coiled portion of the catheter was parked into the pelvis under direct laparoscopic guidance. The deep cuff was secured to the fascial pursestring suture. A small counterincision was made in  the left abdomen and the catheter was brought out this site. The appropriate distal connectors were placed, and I then placed 500 cc of saline through the catheter into the pelvis. The abdomen was desufflated. Immediately, over 500 cc of effluent returned through the catheter when the bag was placed to gravity.  This included some pre-existing ascitic fluid that was noticed on the entry into the peritoneal cavity.  I took one more look with the camera to ensure that the catheter was in the pelvis and it was. The 85mm trocar was then removed. I then closed the incisions with 3-0 Vicryl and 4-0 Monocryl and placed Dermabond as dressing. Dry dressing was placed around the catheter exit site. The patient was then prepared for ligation of her left arm AV fistula to treat her steal syndrome.  This will be dictated separately.  COMPLICATIONS: None  CONDITION: None  Leotis Pain, MD 11/19/2018 4:40 PM   This note was created with Dragon Medical transcription system. Any errors in dictation are purely unintentional.

## 2018-11-19 NOTE — Op Note (Signed)
[facility]   OPERATIVE NOTE  DATE: 11/19/2018  PRE-OPERATIVE DIAGNOSIS: Steal syndrome left arm with patent left brachiocephalic AV fistula, ESRD, transitioning over to peritoneal dialysis  POST-OPERATIVE DIAGNOSIS: same as above  PROCEDURE: 1.   Ligation of left brachiocephalic AVF  SURGEON: Leotis Pain, MD  ASSISTANT(S): Hezzie Bump, PA-C  ANESTHESIA: general  ESTIMATED BLOOD LOSS: 15 cc  FINDING(S): 1.  none  SPECIMEN(S):  None  INDICATIONS:   Patient is a 68 y.o.female who presents with end-stage renal disease and steal syndrome of the left arm and a large patent left brachiocephalic AV fistula.  The patient is elected to transition to peritoneal dialysis and a peritoneal dialysis catheter was placed earlier.  Please see that operative note for full details of that procedure.  We are ligating her AV fistula to remove symptoms of steal. Risks and benefits were discussed and the patient was agreeable to proceed. An assistant was present during the procedure to help facilitate the exposure and expedite the procedure.   DESCRIPTION: After obtaining full informed written consent, the patient was brought back to the operating room and placed supine upon the operating table.  The patient received IV antibiotics prior to induction. The assistant provided retraction and mobilization to help facilitate exposure and expedite the procedure throughout the entire procedure.  This included following suture, using retractors, and optimizing lighting. After obtaining adequate anesthesia, the patient was prepped and draped in the standard fashion. I created a small transverse incision in the mid to distal upper arm overlying the AVF.  The fistula was dissected out.  In that area, I ligated the left brachiocephalic AV fistula with two 2-0 silk ties.  There was no thrill or flow proximal to the ties after ligation.  The wound was then irrigated and closed with a 3-0 Vicryl and  a 4-0 Monocryl. Dermabond was placed as a dressing. The patient was taken to the recovery room in stable condition having tolerated the procedure well.  COMPLICATIONS: None  CONDITION: Stable   Leotis Pain 11/19/2018 5:04 PM  This note was created with Dragon Medical transcription system. Any errors in dictation are purely unintentional.

## 2018-11-19 NOTE — ED Triage Notes (Signed)
PT to ED via EMS from home. PT was just d/c from here for surgery today for catheter moved to abdomen from left arm. Per EMS, pt was given fentanyl and then discharged home. Pt attempted to climb stairs and slipped, 2 witnessed caught pt and assisted her to ground. PT alert at this time.

## 2018-11-19 NOTE — Anesthesia Post-op Follow-up Note (Signed)
Anesthesia QCDR form completed.        

## 2018-11-20 ENCOUNTER — Emergency Department: Payer: Medicare Other

## 2018-11-20 DIAGNOSIS — J811 Chronic pulmonary edema: Secondary | ICD-10-CM

## 2018-11-20 DIAGNOSIS — R7989 Other specified abnormal findings of blood chemistry: Secondary | ICD-10-CM | POA: Diagnosis not present

## 2018-11-20 DIAGNOSIS — J9601 Acute respiratory failure with hypoxia: Secondary | ICD-10-CM | POA: Diagnosis present

## 2018-11-20 DIAGNOSIS — E1165 Type 2 diabetes mellitus with hyperglycemia: Secondary | ICD-10-CM | POA: Diagnosis present

## 2018-11-20 DIAGNOSIS — E119 Type 2 diabetes mellitus without complications: Secondary | ICD-10-CM | POA: Diagnosis not present

## 2018-11-20 DIAGNOSIS — I10 Essential (primary) hypertension: Secondary | ICD-10-CM | POA: Diagnosis not present

## 2018-11-20 DIAGNOSIS — Z79899 Other long term (current) drug therapy: Secondary | ICD-10-CM | POA: Diagnosis not present

## 2018-11-20 DIAGNOSIS — J90 Pleural effusion, not elsewhere classified: Secondary | ICD-10-CM | POA: Diagnosis not present

## 2018-11-20 DIAGNOSIS — I5023 Acute on chronic systolic (congestive) heart failure: Secondary | ICD-10-CM | POA: Diagnosis present

## 2018-11-20 DIAGNOSIS — Z794 Long term (current) use of insulin: Secondary | ICD-10-CM | POA: Diagnosis not present

## 2018-11-20 DIAGNOSIS — I252 Old myocardial infarction: Secondary | ICD-10-CM | POA: Diagnosis not present

## 2018-11-20 DIAGNOSIS — I420 Dilated cardiomyopathy: Secondary | ICD-10-CM | POA: Diagnosis present

## 2018-11-20 DIAGNOSIS — I2510 Atherosclerotic heart disease of native coronary artery without angina pectoris: Secondary | ICD-10-CM | POA: Diagnosis present

## 2018-11-20 DIAGNOSIS — R778 Other specified abnormalities of plasma proteins: Secondary | ICD-10-CM

## 2018-11-20 DIAGNOSIS — T82898A Other specified complication of vascular prosthetic devices, implants and grafts, initial encounter: Secondary | ICD-10-CM | POA: Diagnosis present

## 2018-11-20 DIAGNOSIS — Z20828 Contact with and (suspected) exposure to other viral communicable diseases: Secondary | ICD-10-CM | POA: Diagnosis present

## 2018-11-20 DIAGNOSIS — I255 Ischemic cardiomyopathy: Secondary | ICD-10-CM | POA: Diagnosis present

## 2018-11-20 DIAGNOSIS — I25118 Atherosclerotic heart disease of native coronary artery with other forms of angina pectoris: Secondary | ICD-10-CM

## 2018-11-20 DIAGNOSIS — Z951 Presence of aortocoronary bypass graft: Secondary | ICD-10-CM | POA: Diagnosis not present

## 2018-11-20 DIAGNOSIS — J9811 Atelectasis: Secondary | ICD-10-CM | POA: Diagnosis present

## 2018-11-20 DIAGNOSIS — R197 Diarrhea, unspecified: Secondary | ICD-10-CM | POA: Diagnosis present

## 2018-11-20 DIAGNOSIS — W19XXXA Unspecified fall, initial encounter: Secondary | ICD-10-CM

## 2018-11-20 DIAGNOSIS — Z7982 Long term (current) use of aspirin: Secondary | ICD-10-CM | POA: Diagnosis not present

## 2018-11-20 DIAGNOSIS — I480 Paroxysmal atrial fibrillation: Secondary | ICD-10-CM | POA: Diagnosis present

## 2018-11-20 DIAGNOSIS — Z955 Presence of coronary angioplasty implant and graft: Secondary | ICD-10-CM | POA: Diagnosis not present

## 2018-11-20 DIAGNOSIS — I132 Hypertensive heart and chronic kidney disease with heart failure and with stage 5 chronic kidney disease, or end stage renal disease: Secondary | ICD-10-CM | POA: Diagnosis present

## 2018-11-20 DIAGNOSIS — I509 Heart failure, unspecified: Secondary | ICD-10-CM

## 2018-11-20 DIAGNOSIS — D631 Anemia in chronic kidney disease: Secondary | ICD-10-CM | POA: Diagnosis present

## 2018-11-20 DIAGNOSIS — M79661 Pain in right lower leg: Secondary | ICD-10-CM | POA: Diagnosis not present

## 2018-11-20 DIAGNOSIS — N2581 Secondary hyperparathyroidism of renal origin: Secondary | ICD-10-CM | POA: Diagnosis present

## 2018-11-20 DIAGNOSIS — Z992 Dependence on renal dialysis: Secondary | ICD-10-CM | POA: Diagnosis not present

## 2018-11-20 DIAGNOSIS — E785 Hyperlipidemia, unspecified: Secondary | ICD-10-CM | POA: Diagnosis present

## 2018-11-20 DIAGNOSIS — E1122 Type 2 diabetes mellitus with diabetic chronic kidney disease: Secondary | ICD-10-CM | POA: Diagnosis present

## 2018-11-20 DIAGNOSIS — N186 End stage renal disease: Secondary | ICD-10-CM

## 2018-11-20 DIAGNOSIS — R531 Weakness: Secondary | ICD-10-CM | POA: Diagnosis not present

## 2018-11-20 DIAGNOSIS — I272 Pulmonary hypertension, unspecified: Secondary | ICD-10-CM | POA: Diagnosis present

## 2018-11-20 DIAGNOSIS — S0990XA Unspecified injury of head, initial encounter: Secondary | ICD-10-CM | POA: Diagnosis not present

## 2018-11-20 LAB — CBC
HCT: 37.2 % (ref 36.0–46.0)
Hemoglobin: 12.1 g/dL (ref 12.0–15.0)
MCH: 30.1 pg (ref 26.0–34.0)
MCHC: 32.5 g/dL (ref 30.0–36.0)
MCV: 92.5 fL (ref 80.0–100.0)
Platelets: 166 10*3/uL (ref 150–400)
RBC: 4.02 MIL/uL (ref 3.87–5.11)
RDW: 16.6 % — ABNORMAL HIGH (ref 11.5–15.5)
WBC: 9.6 10*3/uL (ref 4.0–10.5)
nRBC: 0 % (ref 0.0–0.2)

## 2018-11-20 LAB — COMPREHENSIVE METABOLIC PANEL
ALT: 23 U/L (ref 0–44)
AST: 28 U/L (ref 15–41)
Albumin: 3 g/dL — ABNORMAL LOW (ref 3.5–5.0)
Alkaline Phosphatase: 550 U/L — ABNORMAL HIGH (ref 38–126)
Anion gap: 12 (ref 5–15)
BUN: 33 mg/dL — ABNORMAL HIGH (ref 8–23)
CO2: 21 mmol/L — ABNORMAL LOW (ref 22–32)
Calcium: 8.2 mg/dL — ABNORMAL LOW (ref 8.9–10.3)
Chloride: 98 mmol/L (ref 98–111)
Creatinine, Ser: 3.75 mg/dL — ABNORMAL HIGH (ref 0.44–1.00)
GFR calc Af Amer: 14 mL/min — ABNORMAL LOW (ref >60)
GFR calc non Af Amer: 12 mL/min — ABNORMAL LOW (ref >60)
Glucose, Bld: 237 mg/dL — ABNORMAL HIGH (ref 70–99)
Potassium: 4.4 mmol/L (ref 3.5–5.1)
Sodium: 131 mmol/L — ABNORMAL LOW (ref 135–145)
Total Bilirubin: 1.6 mg/dL — ABNORMAL HIGH (ref 0.3–1.2)
Total Protein: 6.6 g/dL (ref 6.5–8.1)

## 2018-11-20 LAB — PHOSPHORUS: Phosphorus: 5.5 mg/dL — ABNORMAL HIGH (ref 2.5–4.6)

## 2018-11-20 LAB — GLUCOSE, CAPILLARY
Glucose-Capillary: 323 mg/dL — ABNORMAL HIGH (ref 70–99)
Glucose-Capillary: 161 mg/dL — ABNORMAL HIGH (ref 70–99)
Glucose-Capillary: 183 mg/dL — ABNORMAL HIGH (ref 70–99)

## 2018-11-20 LAB — TROPONIN I: Troponin I: 0.18 ng/mL (ref <0.03)

## 2018-11-20 LAB — TSH: TSH: 3.902 u[IU]/mL (ref 0.350–4.500)

## 2018-11-20 MED ORDER — ONDANSETRON HCL 4 MG PO TABS: 4.0000 mg | ORAL_TABLET | Freq: Four times a day (QID) | ORAL | Status: DC | PRN

## 2018-11-20 MED ORDER — CALCIUM ACETATE (PHOS BINDER) 667 MG PO CAPS
1334.0000 mg | ORAL_CAPSULE | Freq: Two times a day (BID) | ORAL | Status: DC
  Administered 2018-11-21: 1334 mg via ORAL
  Filled 2018-11-20 (×3): qty 2

## 2018-11-20 MED ORDER — LIDOCAINE-PRILOCAINE 2.5-2.5 % EX CREA
1.0000 "application " | TOPICAL_CREAM | CUTANEOUS | Status: DC | PRN
  Filled 2018-11-20: qty 5

## 2018-11-20 MED ORDER — SODIUM CHLORIDE 0.9 % IV SOLN: 100.0000 mL | INTRAVENOUS | Status: DC | PRN

## 2018-11-20 MED ORDER — IOPAMIDOL (ISOVUE-370) INJECTION 76%
125.0000 mL | Freq: Once | INTRAVENOUS | Status: AC | PRN
  Administered 2018-11-20: 125 mL via INTRAVENOUS

## 2018-11-20 MED ORDER — CARVEDILOL 6.25 MG PO TABS
6.2500 mg | ORAL_TABLET | Freq: Two times a day (BID) | ORAL | Status: DC
  Administered 2018-11-21: 6.25 mg via ORAL
  Filled 2018-11-20: qty 1

## 2018-11-20 MED ORDER — FUROSEMIDE 10 MG/ML IJ SOLN
40.0000 mg | Freq: Once | INTRAMUSCULAR | Status: AC
  Administered 2018-11-20: 40 mg via INTRAVENOUS
  Filled 2018-11-20: qty 4

## 2018-11-20 MED ORDER — INSULIN GLARGINE 100 UNIT/ML ~~LOC~~ SOLN
10.0000 [IU] | Freq: Every day | SUBCUTANEOUS | Status: DC
  Administered 2018-11-20: 10 [IU] via SUBCUTANEOUS
  Filled 2018-11-20 (×2): qty 0.1

## 2018-11-20 MED ORDER — ASPIRIN EC 81 MG PO TBEC
81.0000 mg | DELAYED_RELEASE_TABLET | Freq: Every day | ORAL | Status: DC
  Administered 2018-11-20 – 2018-11-21 (×2): 81 mg via ORAL
  Filled 2018-11-20 (×2): qty 1

## 2018-11-20 MED ORDER — ALTEPLASE 2 MG IJ SOLR: 2.0000 mg | Freq: Once | INTRAMUSCULAR | Status: DC | PRN

## 2018-11-20 MED ORDER — ACETAMINOPHEN 325 MG PO TABS: 650.0000 mg | ORAL_TABLET | Freq: Four times a day (QID) | ORAL | Status: DC | PRN

## 2018-11-20 MED ORDER — CHLORHEXIDINE GLUCONATE CLOTH 2 % EX PADS
6.0000 | MEDICATED_PAD | Freq: Every day | CUTANEOUS | Status: DC
  Administered 2018-11-21: 6 via TOPICAL

## 2018-11-20 MED ORDER — ATORVASTATIN CALCIUM 80 MG PO TABS
80.0000 mg | ORAL_TABLET | Freq: Every evening | ORAL | Status: DC
  Filled 2018-11-20 (×2): qty 1

## 2018-11-20 MED ORDER — HYDRALAZINE HCL 25 MG PO TABS
25.0000 mg | ORAL_TABLET | Freq: Two times a day (BID) | ORAL | Status: DC
  Administered 2018-11-21: 25 mg via ORAL
  Filled 2018-11-20 (×2): qty 1

## 2018-11-20 MED ORDER — HEPARIN SODIUM (PORCINE) 5000 UNIT/ML IJ SOLN
5000.0000 [IU] | Freq: Three times a day (TID) | INTRAMUSCULAR | Status: DC
  Administered 2018-11-20 – 2018-11-21 (×3): 5000 [IU] via SUBCUTANEOUS
  Filled 2018-11-20 (×3): qty 1

## 2018-11-20 MED ORDER — ONDANSETRON HCL 4 MG/2ML IJ SOLN
4.0000 mg | Freq: Four times a day (QID) | INTRAMUSCULAR | Status: DC | PRN
  Administered 2018-11-20: 4 mg via INTRAVENOUS
  Filled 2018-11-20: qty 2

## 2018-11-20 MED ORDER — INSULIN ASPART 100 UNIT/ML ~~LOC~~ SOLN
0.0000 [IU] | Freq: Three times a day (TID) | SUBCUTANEOUS | Status: DC
  Administered 2018-11-20: 7 [IU] via SUBCUTANEOUS
  Filled 2018-11-20: qty 1

## 2018-11-20 MED ORDER — PENTAFLUOROPROP-TETRAFLUOROETH EX AERO
1.0000 "application " | INHALATION_SPRAY | CUTANEOUS | Status: DC | PRN
  Filled 2018-11-20: qty 30

## 2018-11-20 MED ORDER — AMIODARONE HCL 200 MG PO TABS
200.0000 mg | ORAL_TABLET | Freq: Every day | ORAL | Status: DC
  Administered 2018-11-20 – 2018-11-21 (×2): 200 mg via ORAL
  Filled 2018-11-20 (×2): qty 1

## 2018-11-20 MED ORDER — LIDOCAINE HCL (PF) 1 % IJ SOLN
5.0000 mL | INTRAMUSCULAR | Status: DC | PRN
  Filled 2018-11-20: qty 5

## 2018-11-20 MED ORDER — RENA-VITE PO TABS
1.0000 | ORAL_TABLET | Freq: Every day | ORAL | Status: DC
  Administered 2018-11-21: 1 via ORAL
  Filled 2018-11-20: qty 1

## 2018-11-20 MED ORDER — DOCUSATE SODIUM 100 MG PO CAPS
100.0000 mg | ORAL_CAPSULE | Freq: Two times a day (BID) | ORAL | Status: DC
  Filled 2018-11-20 (×2): qty 1

## 2018-11-20 MED ORDER — ACETAMINOPHEN 650 MG RE SUPP: 650.0000 mg | Freq: Four times a day (QID) | RECTAL | Status: DC | PRN

## 2018-11-20 MED ORDER — HEPARIN SODIUM (PORCINE) 1000 UNIT/ML DIALYSIS
1000.0000 [IU] | INTRAMUSCULAR | Status: DC | PRN
  Filled 2018-11-20: qty 1

## 2018-11-20 NOTE — ED Notes (Signed)
PT switched to hospital bed and given gingerale
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
## 2018-11-20 NOTE — Progress Notes (Signed)
Post HD Assessment    11/20/18 1906  Neurological  Level of Consciousness Alert  Orientation Level Oriented X4  Respiratory  Respiratory Pattern Regular;Unlabored  Chest Assessment Chest expansion symmetrical  Bilateral Breath Sounds Clear;Diminished  Cough None  Cardiac  Pulse Regular  Heart Sounds S1, S2  ECG Monitor Yes  Cardiac Rhythm NSR  Vascular  R Radial Pulse +2  L Radial Pulse +2  Psychosocial  Psychosocial (WDL) WDL

## 2018-11-20 NOTE — Progress Notes (Signed)
Established hemodialysis patient known at [place] MWF 6:30.

## 2018-11-20 NOTE — OR Nursing (Addendum)
Last time given Fentanyl was at 1626 in the OR by Anesthesia.  Patient was discharged home at 2005.

## 2018-11-20 NOTE — TOC Initial Note (Signed)
Transition of Care [facility]) - Initial/Assessment Note    Patient Details  Name: Theresa Rogers MRN: [id number] Date of Birth: 04/04/1951  [facility] CM/SW Contact:    Latanya Maudlin, RN Phone Number: 11/20/2018, 9:16 AM  Clinical Narrative:   TOC consulted to assist with possible home health needs. Patient currently in ED and is being admitted under observation for CHF. Patient lives at an apartment with her daughter. Patient was discharged yesterday after a Peritoneal Dialysis catheter placement and had fallen once she returned home. Patient reports her knees have been buckling lately. Patient has had PT in the past and reports that short term PT may be beneficial now. Patient also likely to need home health nursing to assist with transition to PD. I have left a message for Dell Ponto, HD liaison to assist with and Dialysis needs. Patient sees cardiologist and PCP at [facility], PCP is [place]. Patient uses a walker at home. Daughter concerned about patient living on second level apartment. Family able to provide transport. Patient weighs self daily and has a scale. CMS Medicare.gov Compare Post Acute Care list reviewed with patient and she has no preference of agency. She used an agency "out of [employer] [place]" in the past but cannot recall the name and it is not in her medical records. Daughter, Threasa Beards also without preference. Referral placed with Corene Cornea at [facility]. No orders yet for home health but will obtain for PT/RN before discharge.                   Expected Discharge Plan: [place] Barriers to Discharge: Continued Medical Work up   Patient Goals and CMS Choice Patient states their goals for this hospitalization and ongoing recovery are:: get my legs stronger CMS Medicare.gov Compare Post Acute Care list provided to:: Patient Choice offered to / list presented to : Patient, Adult Children  Expected Discharge Plan and Services Expected Discharge Plan: [place]   Discharge Planning Services: CM Consult Post Acute Care Choice: [place] arrangements for the past 2 months: Apartment                           HH Arranged: RN, PT   Date HH Agency Contacted: 11/20/18 Time [place]: 2703 Representative spoke with at [place]: [place] Arrangements/Services Living arrangements for the past 2 months: Apartment Lives with:: Adult Children Patient language and need for interpreter reviewed:: Yes Do you feel safe going back to the place where you live?: Yes      Need for Family Participation in Patient Care: No (Comment)   Current home services: DME Criminal Activity/Legal Involvement Pertinent to Current Situation/Hospitalization: No - Comment as needed  Activities of Daily Living      Permission Sought/Granted Permission sought to share information with : Case Manager                Emotional Assessment Appearance:: Appears stated age Attitude/Demeanor/Rapport: Engaged Affect (typically observed): Calm Orientation: : Oriented to Self, Oriented to Place, Oriented to  Time, Oriented to Situation      Admission diagnosis:  Fall   Patient Active Problem List   Diagnosis Date Noted  . CHF exacerbation ([place]) 11/20/2018  . ESRD (end stage renal disease) ([place]) 02/23/2018  . Atrial fibrillation ([place]) [I48.91] 06/19/2016  . S/P CABG x 3 05/27/2016  . Pulmonary hypertension ([place]) 05/20/2016  .  Heart failure ([place]) 05/15/2016  . Cardiorenal syndrome   . Bilateral lower extremity edema   . Dyspnea   . Coronary artery disease of bypass graft of native heart with stable angina pectoris ([place])   . Cardiomyopathy, ischemic   . Systolic dysfunction   . Poorly controlled type 2 diabetes mellitus with complication ([place])   . Radial artery injury 08/03/2015  . Diabetes mellitus with complication ([place]) 22/57/5051  . Chronic kidney disease, stage III (moderate) (H[facility])   . HLD  (hyperlipidemia)   . HTN (hypertension)   . Anemia   . PAF (paroxysmal atrial fibrillation) ([place])   . Chronic systolic CHF (congestive heart failure) ([place])   . Ischemic cardiomyopathy   . CAD (coronary artery disease)   . Congestive dilated cardiomyopathy ([place])   . Coronary artery disease involving native coronary artery of native heart without angina pectoris    PCP:  Marygrace Drought, MD Pharmacy:   [facility] [place], [place] - [address] AT [address] [place] [facility] [place] [address] Phone: [phone] Fax: [phone]     Social Determinants of Health (SDOH) Interventions    Readmission Risk Interventions No flowsheet data found.

## 2018-11-20 NOTE — Progress Notes (Signed)
Advance care planning  Purpose of Encounter CHF  Parties in Attendance Patient  Patients Decisional capacity Alert and oriented.  Able to make medical decisions.  Does not have a documented healthcare power of attorney or ACP documents in place.  Patient has 2 daughters.  Lives with her elder daughter.  But she wishes for her younger daughter-Spencer,Melanie to make healthcare decisions if she is unable to at any point.  Discussed in detail regarding CHF, end-stage renal disease.  Treatment plan , prognosis discussed.  All questions answered. At this time we are waiting for hemodialysis and fluid removal to see how patient improves.  Reassess after hemodialysis regarding oxygen requirement and discharge plan.  CODE STATUS discussed.  Patient wishes for intubation and CPR if needed but no prolonged life support.  Orders and CODE STATUS changed  FULL CODE  Time spent - 17 minutes

## 2018-11-20 NOTE — ED Notes (Signed)
Dietary services contacted at this time to ensure pt receives appropriate tray for diet order.
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
## 2018-11-20 NOTE — Progress Notes (Signed)
HD Tx completed, tolerated well, UF goal met.    11/20/18 1900  Vital Signs  Pulse Rate 82  Pulse Rate Source Monitor  Resp 19  BP (!) 148/78  BP Location Right Arm  BP Method Automatic  Patient Position (if appropriate) Lying  Oxygen Therapy  SpO2 99 %  O2 Device Nasal Cannula  O2 Flow Rate (L/min) 2 L/min  Pulse Oximetry Type Continuous  During Hemodialysis Assessment  HD Safety Checks Performed Yes  KECN 61.7 KECN  Dialysis Fluid Bolus Normal Saline  Bolus Amount (mL) 250 mL  Intra-Hemodialysis Comments Tx completed;Tolerated well

## 2018-11-20 NOTE — ED Notes (Signed)
Pt given dietary breakfast tray with egg biscuit, grapes, and apple juice at this time

## 2018-11-20 NOTE — ED Notes (Signed)
Ambulated pt with walker. PT walked around room slowly but steadily, states her leg still feels like it is going to give out on her like it has all week. PT O2 dropped as well, pt placed back on oxygen.

## 2018-11-20 NOTE — Progress Notes (Signed)
Pre HD Assessment    11/20/18 1530  Neurological  Level of Consciousness Alert  Orientation Level Oriented X4  Respiratory  Respiratory Pattern Regular;Unlabored  Chest Assessment Chest expansion symmetrical  Bilateral Breath Sounds Diminished;Fine crackles  Cough None  Cardiac  Pulse Regular  Heart Sounds S1, S2  ECG Monitor Yes  Cardiac Rhythm NSR  Vascular  R Radial Pulse +2  L Radial Pulse +2  Psychosocial  Psychosocial (WDL) WDL

## 2018-11-20 NOTE — Care Management Obs Status (Signed)
[facility] NOTIFICATION   Patient Details  Name: Cova Knieriem MRN: [id number] Date of Birth: 12-30-1950   Medicare Observation Status Notification Given:  Yes    Debbrah Sampedro A Guyla Bless, RN 11/20/2018, 8:35 AM

## 2018-11-20 NOTE — Progress Notes (Signed)
Pre HD Tx   11/20/18 1534  Hand-Off documentation  Report given to (Full Name) Trellis Paganini, RN   Report received from (Full Name) Beatris Ship, RN   Vital Signs  Temp 98.1 F (36.7 C)  Temp Source Oral  Pulse Rate 95  Pulse Rate Source Monitor  Resp 16  BP (!) 155/90  BP Location Right Arm  BP Method Automatic  Patient Position (if appropriate) Lying  Oxygen Therapy  SpO2 100 %  O2 Device Nasal Cannula  O2 Flow Rate (L/min) 2 L/min  Pulse Oximetry Type Continuous  Pain Assessment  Pain Scale 0-10  Pain Score 0  Dialysis Weight  Weight 64 kg  Type of Weight Pre-Dialysis  Time-Out for Hemodialysis  What Procedure? HD  Pt Identifiers(min of two) First/Last Name;MRN/Account#  Correct Site? Yes  Correct Side? Yes  Correct Procedure? Yes  Consents Verified? Yes  Rad Studies Available? N/A  Safety Precautions Reviewed? Yes  Engineer, civil (consulting) Number 5  Station Number 3  UF/Alarm Test Passed  Conductivity: Meter 13.8  Conductivity: Machine  13.8  pH 7.2  Reverse Osmosis Main  Normal Saline Lot Number [id number]  Dialyzer Lot Number [id number]  Disposable Set Lot Number [id number]  Machine Temperature 98.6 F (37 C)  Musician and Audible Yes  Blood Lines Intact and Secured Yes  Pre Treatment Patient Checks  Vascular access used during treatment Catheter  Hepatitis B Surface Antigen Results Negative  Date Hepatitis B Surface Antigen Drawn 10/05/18  Hepatitis B Surface Antibody  (>10)  Date Hepatitis B Surface Antibody Drawn 10/05/18  Hemodialysis Consent Verified Yes  Hemodialysis Standing Orders Initiated Yes  ECG (Telemetry) Monitor On Yes  Prime Ordered Normal Saline  Length of  DialysisTreatment -hour(s) 3.5 Hour(s)  Dialysis Treatment Comments Na 140  Dialyzer Elisio 17H NR  Dialysate 2K, 2.5 Ca  Dialysis Anticoagulant None  Dialysate Flow Ordered 800  Blood Flow Rate Ordered 400 mL/min  Ultrafiltration Goal 1.5 Liters  Pre Treatment Labs  Phosphorus  Dialysis Blood Pressure Support Ordered Normal Saline  Education / Care Plan  Dialysis Education Provided Yes  Documented Education in Care Plan Yes  Hemodialysis Catheter Right Subclavian  Placement Date/Time: 11/20/18 1130   Placed prior to admission: Yes  Orientation: Right  Access Location: Subclavian  Site Condition No complications  Blue Lumen Status Blood return noted  Red Lumen Status Blood return noted  Purple Lumen Status N/A  Dressing Type Gauze/Drain sponge  Dressing Status Clean;Dry;Intact

## 2018-11-20 NOTE — ED Notes (Signed)
Attempted to call report at this time. RN on 1C concerned about appropriates of pt bed placement and states supervisor reviewing pt to see if pt needs to be placed on 2A

## 2018-11-20 NOTE — Progress Notes (Signed)
HD Tx started w/o complication    59/92/34 1536  Vital Signs  Pulse Rate 94  Pulse Rate Source Monitor  Resp 18  BP (!) 161/84  BP Location Right Arm  BP Method Automatic  Patient Position (if appropriate) Lying  Oxygen Therapy  SpO2 100 %  O2 Device Nasal Cannula  O2 Flow Rate (L/min) 2 L/min  Pulse Oximetry Type Continuous  During Hemodialysis Assessment  Blood Flow Rate (mL/min) 400 mL/min  Arterial Pressure (mmHg) -140 mmHg  Venous Pressure (mmHg) 110 mmHg  Transmembrane Pressure (mmHg) 50 mmHg  Ultrafiltration Rate (mL/min) 660 mL/min  Dialysate Flow Rate (mL/min) 800 ml/min  Conductivity: Machine  13.8  HD Safety Checks Performed Yes  Dialysis Fluid Bolus Normal Saline  Bolus Amount (mL) 250 mL  Intra-Hemodialysis Comments Tx initiated

## 2018-11-20 NOTE — Plan of Care (Signed)
Newly admitted to cardiac/telemetry unit.   Problem: Education: Goal: Knowledge of General Education information will improve Description: Including pain rating scale, medication(s)/side effects and non-pharmacologic comfort measures Outcome: Progressing   Problem: Health Behavior/Discharge Planning: Goal: Ability to manage health-related needs will improve Outcome: Progressing   Problem: Clinical Measurements: Goal: Ability to maintain clinical measurements within normal limits will improve Outcome: Progressing Goal: Will remain free from infection Outcome: Progressing Goal: Diagnostic test results will improve Outcome: Progressing Goal: Respiratory complications will improve Outcome: Progressing Goal: Cardiovascular complication will be avoided Outcome: Progressing   Problem: Activity: Goal: Risk for activity intolerance will decrease Outcome: Progressing   Problem: Nutrition: Goal: Adequate nutrition will be maintained Outcome: Progressing   Problem: Coping: Goal: Level of anxiety will decrease Outcome: Progressing   Problem: Elimination: Goal: Will not experience complications related to bowel motility Outcome: Progressing Goal: Will not experience complications related to urinary retention Outcome: Progressing   Problem: Pain Managment: Goal: General experience of comfort will improve Outcome: Progressing   Problem: Safety: Goal: Ability to remain free from injury will improve Outcome: Progressing   Problem: Skin Integrity: Goal: Risk for impaired skin integrity will decrease Outcome: Progressing   Problem: Education: Goal: Ability to demonstrate management of disease process will improve Outcome: Progressing Goal: Ability to verbalize understanding of medication therapies will improve Outcome: Progressing Goal: Individualized Educational Video(s) Outcome: Progressing   Problem: Activity: Goal: Capacity to carry out activities will improve Outcome:  Progressing   Problem: Cardiac: Goal: Ability to achieve and maintain adequate cardiopulmonary perfusion will improve Outcome: Progressing
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
## 2018-11-20 NOTE — Progress Notes (Signed)
Post HD Tx    11/20/18 1906  Hand-Off documentation  Report given to (Full Name) Jacqualine Mau, RN   Report received from (Full Name) Beatris Ship, RN   Vital Signs  Temp 98.1 F (36.7 C)  Temp Source Oral  Pulse Rate 81  Pulse Rate Source Monitor  Resp 18  BP (!) 155/83  BP Location Right Arm  BP Method Automatic  Patient Position (if appropriate) Lying  Oxygen Therapy  SpO2 100 %  O2 Device Nasal Cannula  O2 Flow Rate (L/min) 2 L/min  Pulse Oximetry Type Continuous  Pain Assessment  Pain Scale 0-10  Pain Score 0  Dialysis Weight  Weight 62.8 kg  Type of Weight Post-Dialysis  Post-Hemodialysis Assessment  Rinseback Volume (mL) 250 mL  KECN 61.7 V  Dialyzer Clearance Lightly streaked  Duration of HD Treatment -hour(s) 3.5 hour(s)  Hemodialysis Intake (mL) 500 mL  UF Total -Machine (mL) 2000 mL  Net UF (mL) 1500 mL  Tolerated HD Treatment Yes  Fistula / Graft Left Other (Comment) Arteriovenous fistula  Placement Date/Time: (c) 11/20/18 1949   Placed prior to admission: No  Orientation: Left  Access Location: (c) Other (Comment)  Access Type: Arteriovenous fistula  Site Condition No complications  Fistula / Graft Assessment Absent ;Thrill;Bruit  Hemodialysis Catheter Right Subclavian  Placement Date/Time: 11/20/18 1130   Placed prior to admission: Yes  Orientation: Right  Access Location: Subclavian  Site Condition No complications  Blue Lumen Status Heparin locked  Red Lumen Status Heparin locked  Purple Lumen Status N/A  Post treatment catheter status Capped and Clamped

## 2018-11-21 LAB — GLUCOSE, CAPILLARY
Glucose-Capillary: 35 mg/dL — CL (ref 70–99)
Glucose-Capillary: 40 mg/dL — CL (ref 70–99)
Glucose-Capillary: 71 mg/dL (ref 70–99)
Glucose-Capillary: 111 mg/dL — ABNORMAL HIGH (ref 70–99)

## 2018-11-21 LAB — MRSA PCR SCREENING: MRSA by PCR: NEGATIVE

## 2018-11-21 MED ORDER — FUROSEMIDE 20 MG PO TABS: 20.0000 mg | ORAL_TABLET | Freq: Once | ORAL | Status: DC

## 2018-11-21 NOTE — Plan of Care (Signed)

## 2018-11-21 NOTE — Progress Notes (Signed)
[facility]  ROUNDING NOTE   Subjective:  Patient well-known to [place]. She underwent PD catheter placement recently. Postsurgical she became lightheaded and experienced a fall. Abrasions noted on bilateral upper extremities. Patient due for dialysis again on Monday. She did undergo dialysis treatment yesterday.   Objective:  Vital signs in last 24 hours:  Temp:  [97.5 F (36.4 C)-98.2 F (36.8 C)] 97.5 F (36.4 C) (06/13 0809) Pulse Rate:  [75-95] 75 (06/13 0809) Resp:  [15-24] 18 (06/13 0335) BP: (109-174)/(67-142) 109/67 (06/13 0809) SpO2:  [95 %-100 %] 96 % (06/13 0809) Weight:  [62.8 kg-65.1 kg] 65.1 kg (06/13 0335)  Weight change: 0.043 kg Filed Weights   11/20/18 1906 11/20/18 1930 11/21/18 0335  Weight: 62.8 kg 65 kg 65.1 kg    Intake/Output: I/O last 3 completed shifts: In: -  Out: 1500 [Other:1500]   Intake/Output this shift:  No intake/output data recorded.  Physical Exam: General: No acute distress  Head: Normocephalic, atraumatic. Moist oral mucosal membranes  Eyes: Anicteric  Neck: Supple, trachea midline  Lungs:  Clear to auscultation, normal effort  Heart: S1S2 no rubs  Abdomen:  Soft, nontender, bowel sounds present  Extremities: Trace peripheral edema.  Neurologic: Awake, alert, following commands  Skin: B/L UE abrasions       Basic Metabolic Panel: Recent Labs  Lab 11/19/18 1449 11/20/18 0459 11/20/18 1642  NA 131* 131*  --   K 4.2 4.4  --   CL  --  98  --   CO2  --  21*  --   GLUCOSE 319* 237*  --   BUN  --  33*  --   CREATININE  --  3.75*  --   CALCIUM  --  8.2*  --   PHOS  --   --  5.5*    Liver Function Tests: Recent Labs  Lab 11/20/18 0459  AST 28  ALT 23  ALKPHOS 550*  BILITOT 1.6*  PROT 6.6  ALBUMIN 3.0*   No results for input(s): LIPASE, AMYLASE in the last 168 hours. No results for input(s): AMMONIA in the last 168 hours.  CBC: Recent Labs  Lab 11/19/18 1449 11/20/18 0459  WBC  --  9.6  HGB  12.9 12.1  HCT 38.0 37.2  MCV  --  92.5  PLT  --  166    Cardiac Enzymes: Recent Labs  Lab 11/20/18 0459  TROPONINI 0.18*    BNP: Invalid input(s): POCBNP  CBG: Recent Labs  Lab 11/20/18 2211 11/21/18 0742 11/21/18 0802 11/21/18 0820 11/21/18 1138  GLUCAP 183* 35* 59* 71 111*    Microbiology: Results for orders placed or performed during the hospital encounter of 11/19/18  MRSA PCR Screening     Status: None   Collection Time: 11/21/18  6:53 AM   Specimen: Nasopharyngeal  Result Value Ref Range Status   MRSA by PCR NEGATIVE NEGATIVE Final    Comment:        The GeneXpert MRSA Assay (FDA approved for NASAL specimens only), is one component of a comprehensive MRSA colonization surveillance program. It is not intended to diagnose MRSA infection nor to guide or monitor treatment for MRSA infections. Performed at [facility], [address] [place], [place] [address]     Coagulation Studies: No results for input(s): LABPROT, INR in the last 72 hours.  Urinalysis: No results for input(s): COLORURINE, LABSPEC, PHURINE, GLUCOSEU, HGBUR, BILIRUBINUR, KETONESUR, PROTEINUR, UROBILINOGEN, NITRITE, LEUKOCYTESUR in the last 72 hours.  Invalid input(s): APPERANCEUR  Imaging: Ct Head Wo Contrast  Result Date: 11/20/2018 CLINICAL DATA:  68 year old female status post fall. Status post laparoscopic peritoneal dialysis catheter placement and ligation of left brachiocephalic arteriovenous fistula yesterday. EXAM: CT HEAD WITHOUT CONTRAST TECHNIQUE: Contiguous axial images were obtained from the base of the skull through the vertex without intravenous contrast. COMPARISON:  None. FINDINGS: Brain: No midline shift, ventriculomegaly, mass effect, evidence of mass lesion, intracranial hemorrhage or evidence of cortically based acute infarction. Gray-white matter differentiation is within normal limits throughout the brain. No cortical encephalomalacia identified.  Vascular: Calcified atherosclerosis at the skull base. No suspicious intracranial vascular hyperdensity. Skull: Negative. Sinuses/Orbits: Visualized paranasal sinuses and mastoids are clear. Other: Calcified scalp vessel atherosclerosis. No acute orbit or scalp soft tissue finding. IMPRESSION: Normal for age non contrast CT appearance of the brain. Electronically Signed   By: Genevie Ann M.D.   On: 11/20/2018 00:46   Ct Angio Ao+bifem W & Or Wo Contrast  Result Date: 11/20/2018 CLINICAL DATA:  Right lower extremity pain EXAM: CT ANGIOGRAPHY OF ABDOMINAL AORTA WITH ILIOFEMORAL RUNOFF TECHNIQUE: Multidetector CT imaging of the abdomen, pelvis and lower extremities was performed using the standard protocol during bolus administration of intravenous contrast. Multiplanar CT image reconstructions and MIPs were obtained to evaluate the vascular anatomy. CONTRAST:  112mL ISOVUE-370 IOPAMIDOL (ISOVUE-370) INJECTION 76% COMPARISON:  None. FINDINGS: VASCULAR Aorta: Normal caliber aorta without aneurysm, dissection, vasculitis or significant stenosis. Celiac: Patent without evidence of aneurysm, dissection, vasculitis or significant stenosis. SMA: Patent without evidence of aneurysm, dissection, vasculitis or significant stenosis. Renals: Both renal arteries are patent without evidence of aneurysm, dissection, vasculitis, fibromuscular dysplasia or significant stenosis. IMA: Patent without evidence of aneurysm, dissection, vasculitis or significant stenosis. RIGHT Lower Extremity Inflow: Common, internal and external iliac arteries are patent without evidence of aneurysm, dissection, vasculitis or significant stenosis. Outflow: Common, superficial and profunda femoral arteries and the popliteal artery are patent without evidence of aneurysm, dissection, vasculitis or significant stenosis. Extensive atherosclerotic changes are noted with mild multifocal narrowing. Runoff: There is a 2 vessel runoff to the level of the ankle.  The posterior tibial artery is occluded. Extensive vascular calcifications are noted. LEFT Lower Extremity Inflow: Common, internal and external iliac arteries are patent without evidence of aneurysm, dissection, vasculitis or significant stenosis. Outflow: Common, superficial and profunda femoral arteries and the popliteal artery are patent without evidence of aneurysm, dissection, vasculitis or significant stenosis. Runoff: There is a 2 vessel runoff to the level of the ankle. The posterior tibial artery is occluded. Veins: No obvious venous abnormality within the limitations of this arterial phase study. Review of the MIP images confirms the above findings. NON-VASCULAR Lower chest: There is a moderate to large partially visualized right-sided pleural effusion. There is a trace left-sided pleural effusion. There is significant cardiac enlargement. There is atelectasis at the lung bases. There is reflux of contrast into the IVC. Hepatobiliary: The liver is unremarkable. There is cholelithiasis without CT evidence of acute cholecystitis. Pancreas: Unremarkable. No pancreatic ductal dilatation or surrounding inflammatory changes. Spleen: Normal in size without focal abnormality. Adrenals/Urinary Tract: Adrenal glands are unremarkable. Kidneys are normal, without renal calculi, focal lesion, or hydronephrosis. Bladder is unremarkable. Stomach/Bowel: Stomach is within normal limits. The appendix is not visualized on this exam. No evidence of bowel wall thickening, distention, or inflammatory changes. Lymphatic: Aortic atherosclerosis. No enlarged abdominal or pelvic lymph nodes. Reproductive: Uterus and bilateral adnexa are unremarkable. Other: There is diffuse body wall edema involving the lower abdomen. There is  a peritoneal dialysis catheter coursing through the patient's low anterior left abdomen. There is surrounding subcutaneous gas. The catheter terminates in the patient's pelvis. There is a small amount of  free fluid in the patient's abdomen and pelvis. There is some free air within the upper abdomen, likely postsurgical. Musculoskeletal: There is sclerosis of the T12 vertebral body, primarily within the superior endplate. There are degenerative changes throughout the visualized thoracolumbar spine. IMPRESSION: VASCULAR 1. No acute abnormality detected. 2. Two vessel runoff bilaterally. The bilateral posterior tibial arteries are occluded. 3. Extensive atherosclerotic changes are noted throughout the visualized arterial structures. There is no aneurysm. NON-VASCULAR 1. Possible age-indeterminate compression fracture of the T12 vertebral body with minimal associated height loss. 2. Cardiomegaly. 3. Small to moderate size right-sided effusion. Trace left-sided effusion. There is generalized volume overload with body wall edema. 4. Cholelithiasis without CT evidence of acute cholecystitis. 5. Peritoneal dialysis catheter terminates in the patient's pelvis. There is free air in the abdomen likely related to recent surgical intervention. Electronically Signed   By: Constance Holster M.D.   On: 11/20/2018 01:07   Dg Chest Portable 1 View  Result Date: 11/20/2018 CLINICAL DATA:  Dyspnea EXAM: PORTABLE CHEST 1 VIEW COMPARISON:  Chest x-ray dated 08/21/2016 FINDINGS: There is a tunneled dialysis catheter on the right that is well position with tip terminating near the cavoatrial junction. The heart size is significantly enlarged. The patient is status post prior median sternotomy. There are small to moderate-sized bilateral pleural effusions, right greater than left. There is generalized volume overload with pulmonary edema. There is scattered linear airspace opacities bilaterally favored to represent atelectasis or scarring. There is no pneumothorax. IMPRESSION: Cardiomegaly with findings of congestive heart failure. There is a moderate-sized right-sided pleural effusion and a small left-sided pleural effusion with  adjacent atelectasis. Electronically Signed   By: Constance Holster M.D.   On: 11/20/2018 02:43     Medications:    . amiodarone  200 mg Oral Daily  . aspirin EC  81 mg Oral Daily  . atorvastatin  80 mg Oral QPM  . calcium acetate  1,334 mg Oral BID WC  . carvedilol  6.25 mg Oral BID WC  . Chlorhexidine Gluconate Cloth  6 each Topical Q0600  . docusate sodium  100 mg Oral BID  . furosemide  20 mg Oral Once  . heparin  5,000 Units Subcutaneous Q8H  . hydrALAZINE  25 mg Oral Q12H  . insulin aspart  0-9 Units Subcutaneous TID WC  . insulin glargine  10 Units Subcutaneous QHS  . multivitamin  1 tablet Oral Daily   acetaminophen **OR** acetaminophen, ondansetron **OR** ondansetron (ZOFRAN) IV  Assessment/ Plan:  68 y.o. female with past medical history of coronary artery disease status post CABG and multiple cardiac catheterizations, chronic systolic heart failure, diabetes mellitus type 2, ESRD on HD, hyperlipidemia, hypertension, myocardial infarction, paroxysmal atrial fibrillation, anemia chronic kidney disease, secondary hyperparathyroidism who presents status post fall after recent PD catheter placement.  [place] [facility]/MWF/IJ permcath/PD catheter/64.5  1.  ESRD on HD MWF.  Patient underwent hemodialysis yesterday.  No acute indication for dialysis today.  We will plan for dialysis again on Monday if still here.  2.  Anemia of chronic kidney disease.  Hemoglobin currently 12.1.  Hold off on Epogen at this time.  3.  Secondary hyperparathyroidism.  Phosphorus 5.5 and at target.  Maintain the patient on calcium acetate 2 tablets p.o. twice daily with meals.  4.  Hypertension.  Maintain the  patient on carvedilol, hydralazine.     LOS: 1 Adela Esteban 6/13/20203:26 PM

## 2018-11-21 NOTE — Progress Notes (Addendum)
Hypoglycemic Event  CBG: 35  Treatment: 15g of carbohydrate - Apple Juice   Symptoms:Blurred vision, Sweaty  Follow-up CBG: Time:0801 CBG Result:40  Follow-up CBG Time: 0819     CBG Result: 71   Possible Reasons for Event: Patient did not have her midnight snack  Comments/MD notified:Tammy, RN made aware. She will continue to follow-up and monitor.      Iran Sizer M
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
## 2018-11-21 NOTE — TOC Transition Note (Signed)
Transition of Care [facility]) - CM/SW Discharge Note   Patient Details  Name: Theresa Rogers MRN: [id number] Date of Birth: 06-24-1950  [facility] CM/SW Contact:  Latanya Maudlin, RN Phone Number: 11/21/2018, 12:11 PM   Clinical Narrative:  Patient to be discharged per MD order. Orders in place for home health services. Patient was previously thought to not be open to home health but per daughter and Cassie from [facility] patient was set up from Dr Lucky Cowboy office prior to admission. Notified Cassie of discharge. New orders for O2. Referral placed with Brad with [employer], will coordinate delivery of supplies to bedside and home delivery.    Final next level of care: [place] Barriers to Discharge: No Barriers Identified   Patient Goals and CMS Choice Patient states their goals for this hospitalization and ongoing recovery are:: get my legs stronger CMS Medicare.gov Compare Post Acute Care list provided to:: Patient Choice offered to / list presented to : Patient  Discharge Placement                       Discharge Plan and Services   Discharge Planning Services: CM Consult Post Acute Care Choice: Home Health          DME Arranged: Oxygen DME Agency: [employer] Date DME Agency Contacted: 11/21/18 Time DME Agency Contacted: [phone] Representative spoke with at DME Agency: [place]: RN, PT [place] Agency: [facility] [place] Date [place]: 11/21/18 Time [place]: 1211 Representative spoke with at [place] ([place]) Interventions     Readmission Risk Interventions Readmission Risk Prevention Plan 11/21/2018  Transportation Screening Complete  PCP or Specialist Appt within 5-7 Days Complete  Home Care Screening Complete  Medication Review (RN CM) Complete  Some recent data might be hidden

## 2018-11-21 NOTE — Progress Notes (Signed)
SATURATION QUALIFICATIONS: (This note is used to comply with regulatory documentation for home oxygen)  Patient Saturations on Room Air at Rest = 83%  Patient Saturations on Room Air while ambulating = n/a %  Patient Saturations on 2 Liters of oxygen while restting = 95%  Please briefly explain why patient needs home oxygen:

## 2018-11-23 LAB — PARATHYROID HORMONE, INTACT (NO CA): PTH: 93 pg/mL — ABNORMAL HIGH (ref 15–65)

## 2018-11-25 DIAGNOSIS — I132 Hypertensive heart and chronic kidney disease with heart failure and with stage 5 chronic kidney disease, or end stage renal disease: Secondary | ICD-10-CM | POA: Diagnosis not present

## 2018-11-25 DIAGNOSIS — I2510 Atherosclerotic heart disease of native coronary artery without angina pectoris: Secondary | ICD-10-CM | POA: Diagnosis not present

## 2018-11-25 DIAGNOSIS — N186 End stage renal disease: Secondary | ICD-10-CM | POA: Diagnosis not present

## 2018-11-25 DIAGNOSIS — M79651 Pain in right thigh: Secondary | ICD-10-CM | POA: Diagnosis not present

## 2018-11-25 DIAGNOSIS — Z1159 Encounter for screening for other viral diseases: Secondary | ICD-10-CM | POA: Diagnosis not present

## 2018-11-25 DIAGNOSIS — E785 Hyperlipidemia, unspecified: Secondary | ICD-10-CM | POA: Diagnosis not present

## 2018-11-25 DIAGNOSIS — I480 Paroxysmal atrial fibrillation: Secondary | ICD-10-CM | POA: Diagnosis not present

## 2018-11-25 DIAGNOSIS — I429 Cardiomyopathy, unspecified: Secondary | ICD-10-CM | POA: Diagnosis not present

## 2018-11-25 DIAGNOSIS — J90 Pleural effusion, not elsewhere classified: Secondary | ICD-10-CM | POA: Diagnosis not present

## 2018-11-25 DIAGNOSIS — J811 Chronic pulmonary edema: Secondary | ICD-10-CM | POA: Diagnosis not present

## 2018-11-25 DIAGNOSIS — R29898 Other symptoms and signs involving the musculoskeletal system: Secondary | ICD-10-CM | POA: Diagnosis not present

## 2018-11-25 DIAGNOSIS — Z7982 Long term (current) use of aspirin: Secondary | ICD-10-CM | POA: Diagnosis not present

## 2018-11-25 DIAGNOSIS — R079 Chest pain, unspecified: Secondary | ICD-10-CM | POA: Diagnosis not present

## 2018-11-25 DIAGNOSIS — M25561 Pain in right knee: Secondary | ICD-10-CM | POA: Diagnosis not present

## 2018-11-25 DIAGNOSIS — Z79899 Other long term (current) drug therapy: Secondary | ICD-10-CM | POA: Diagnosis not present

## 2018-11-25 DIAGNOSIS — M24561 Contracture, right knee: Secondary | ICD-10-CM | POA: Diagnosis not present

## 2018-11-25 DIAGNOSIS — Z20828 Contact with and (suspected) exposure to other viral communicable diseases: Secondary | ICD-10-CM | POA: Diagnosis not present

## 2018-11-25 DIAGNOSIS — Z951 Presence of aortocoronary bypass graft: Secondary | ICD-10-CM | POA: Diagnosis not present

## 2018-11-25 DIAGNOSIS — I5022 Chronic systolic (congestive) heart failure: Secondary | ICD-10-CM | POA: Diagnosis not present

## 2018-11-25 DIAGNOSIS — M25461 Effusion, right knee: Secondary | ICD-10-CM | POA: Diagnosis not present

## 2018-11-25 DIAGNOSIS — R5381 Other malaise: Secondary | ICD-10-CM | POA: Diagnosis not present

## 2018-11-25 DIAGNOSIS — Z452 Encounter for adjustment and management of vascular access device: Secondary | ICD-10-CM | POA: Diagnosis not present

## 2018-11-25 DIAGNOSIS — R140 Abdominal distension (gaseous): Secondary | ICD-10-CM | POA: Diagnosis not present

## 2018-11-25 DIAGNOSIS — S300XXA Contusion of lower back and pelvis, initial encounter: Secondary | ICD-10-CM | POA: Diagnosis not present

## 2018-11-25 DIAGNOSIS — Z992 Dependence on renal dialysis: Secondary | ICD-10-CM | POA: Diagnosis not present

## 2018-11-25 DIAGNOSIS — Z9115 Patient's noncompliance with renal dialysis: Secondary | ICD-10-CM | POA: Diagnosis not present

## 2018-11-25 DIAGNOSIS — R109 Unspecified abdominal pain: Secondary | ICD-10-CM | POA: Diagnosis not present

## 2018-11-25 DIAGNOSIS — E875 Hyperkalemia: Secondary | ICD-10-CM | POA: Diagnosis not present

## 2018-11-25 DIAGNOSIS — E8770 Fluid overload, unspecified: Secondary | ICD-10-CM | POA: Diagnosis not present

## 2018-11-25 DIAGNOSIS — E1022 Type 1 diabetes mellitus with diabetic chronic kidney disease: Secondary | ICD-10-CM | POA: Diagnosis not present

## 2018-11-26 DIAGNOSIS — I5020 Unspecified systolic (congestive) heart failure: Secondary | ICD-10-CM | POA: Diagnosis not present

## 2018-11-26 DIAGNOSIS — D631 Anemia in chronic kidney disease: Secondary | ICD-10-CM | POA: Diagnosis not present

## 2018-11-26 DIAGNOSIS — Z992 Dependence on renal dialysis: Secondary | ICD-10-CM | POA: Diagnosis not present

## 2018-11-26 DIAGNOSIS — N186 End stage renal disease: Secondary | ICD-10-CM | POA: Diagnosis not present

## 2018-11-26 DIAGNOSIS — E875 Hyperkalemia: Secondary | ICD-10-CM | POA: Diagnosis not present

## 2018-11-26 DIAGNOSIS — M79604 Pain in right leg: Secondary | ICD-10-CM | POA: Diagnosis not present

## 2018-11-27 DIAGNOSIS — Z992 Dependence on renal dialysis: Secondary | ICD-10-CM | POA: Diagnosis not present

## 2018-11-27 DIAGNOSIS — D631 Anemia in chronic kidney disease: Secondary | ICD-10-CM | POA: Diagnosis not present

## 2018-11-27 DIAGNOSIS — N186 End stage renal disease: Secondary | ICD-10-CM | POA: Diagnosis not present

## 2018-11-28 DIAGNOSIS — E1022 Type 1 diabetes mellitus with diabetic chronic kidney disease: Secondary | ICD-10-CM | POA: Diagnosis not present

## 2018-11-28 DIAGNOSIS — M79604 Pain in right leg: Secondary | ICD-10-CM | POA: Diagnosis not present

## 2018-11-28 DIAGNOSIS — Z992 Dependence on renal dialysis: Secondary | ICD-10-CM | POA: Diagnosis not present

## 2018-11-28 DIAGNOSIS — I5020 Unspecified systolic (congestive) heart failure: Secondary | ICD-10-CM | POA: Diagnosis not present

## 2018-11-28 DIAGNOSIS — N186 End stage renal disease: Secondary | ICD-10-CM | POA: Diagnosis not present

## 2018-11-29 DIAGNOSIS — Z992 Dependence on renal dialysis: Secondary | ICD-10-CM | POA: Diagnosis not present

## 2018-11-29 DIAGNOSIS — E1022 Type 1 diabetes mellitus with diabetic chronic kidney disease: Secondary | ICD-10-CM | POA: Diagnosis not present

## 2018-11-29 DIAGNOSIS — N186 End stage renal disease: Secondary | ICD-10-CM | POA: Diagnosis not present

## 2018-11-29 DIAGNOSIS — I5020 Unspecified systolic (congestive) heart failure: Secondary | ICD-10-CM | POA: Diagnosis not present

## 2018-11-29 DIAGNOSIS — M79604 Pain in right leg: Secondary | ICD-10-CM | POA: Diagnosis not present

## 2018-11-30 DIAGNOSIS — D631 Anemia in chronic kidney disease: Secondary | ICD-10-CM | POA: Diagnosis not present

## 2018-11-30 DIAGNOSIS — M79604 Pain in right leg: Secondary | ICD-10-CM | POA: Diagnosis not present

## 2018-11-30 DIAGNOSIS — N186 End stage renal disease: Secondary | ICD-10-CM | POA: Diagnosis not present

## 2018-11-30 DIAGNOSIS — Z992 Dependence on renal dialysis: Secondary | ICD-10-CM | POA: Diagnosis not present

## 2018-11-30 DIAGNOSIS — E1022 Type 1 diabetes mellitus with diabetic chronic kidney disease: Secondary | ICD-10-CM | POA: Diagnosis not present

## 2018-12-01 DIAGNOSIS — I4891 Unspecified atrial fibrillation: Secondary | ICD-10-CM | POA: Diagnosis not present

## 2018-12-01 DIAGNOSIS — E785 Hyperlipidemia, unspecified: Secondary | ICD-10-CM | POA: Diagnosis not present

## 2018-12-01 DIAGNOSIS — N186 End stage renal disease: Secondary | ICD-10-CM | POA: Diagnosis not present

## 2018-12-01 DIAGNOSIS — Z9181 History of falling: Secondary | ICD-10-CM | POA: Diagnosis not present

## 2018-12-01 DIAGNOSIS — I252 Old myocardial infarction: Secondary | ICD-10-CM | POA: Diagnosis not present

## 2018-12-01 DIAGNOSIS — Z992 Dependence on renal dialysis: Secondary | ICD-10-CM | POA: Diagnosis not present

## 2018-12-01 DIAGNOSIS — D631 Anemia in chronic kidney disease: Secondary | ICD-10-CM | POA: Diagnosis not present

## 2018-12-01 DIAGNOSIS — I130 Hypertensive heart and chronic kidney disease with heart failure and stage 1 through stage 4 chronic kidney disease, or unspecified chronic kidney disease: Secondary | ICD-10-CM | POA: Diagnosis not present

## 2018-12-01 DIAGNOSIS — I5022 Chronic systolic (congestive) heart failure: Secondary | ICD-10-CM | POA: Diagnosis not present

## 2018-12-01 DIAGNOSIS — Z7982 Long term (current) use of aspirin: Secondary | ICD-10-CM | POA: Diagnosis not present

## 2018-12-01 DIAGNOSIS — E039 Hypothyroidism, unspecified: Secondary | ICD-10-CM | POA: Diagnosis not present

## 2018-12-01 DIAGNOSIS — E1122 Type 2 diabetes mellitus with diabetic chronic kidney disease: Secondary | ICD-10-CM | POA: Diagnosis not present

## 2018-12-01 DIAGNOSIS — Z794 Long term (current) use of insulin: Secondary | ICD-10-CM | POA: Diagnosis not present

## 2018-12-04 DIAGNOSIS — Z79899 Other long term (current) drug therapy: Secondary | ICD-10-CM | POA: Diagnosis not present

## 2018-12-04 DIAGNOSIS — D509 Iron deficiency anemia, unspecified: Secondary | ICD-10-CM | POA: Diagnosis not present

## 2018-12-04 DIAGNOSIS — D631 Anemia in chronic kidney disease: Secondary | ICD-10-CM | POA: Diagnosis not present

## 2018-12-04 DIAGNOSIS — N186 End stage renal disease: Secondary | ICD-10-CM | POA: Diagnosis not present

## 2018-12-04 DIAGNOSIS — Z992 Dependence on renal dialysis: Secondary | ICD-10-CM | POA: Diagnosis not present

## 2018-12-04 DIAGNOSIS — N2581 Secondary hyperparathyroidism of renal origin: Secondary | ICD-10-CM | POA: Diagnosis not present

## 2018-12-07 DIAGNOSIS — N2581 Secondary hyperparathyroidism of renal origin: Secondary | ICD-10-CM | POA: Diagnosis not present

## 2018-12-07 DIAGNOSIS — D509 Iron deficiency anemia, unspecified: Secondary | ICD-10-CM | POA: Diagnosis not present

## 2018-12-07 DIAGNOSIS — Z992 Dependence on renal dialysis: Secondary | ICD-10-CM | POA: Diagnosis not present

## 2018-12-07 DIAGNOSIS — N186 End stage renal disease: Secondary | ICD-10-CM | POA: Diagnosis not present

## 2018-12-07 DIAGNOSIS — D631 Anemia in chronic kidney disease: Secondary | ICD-10-CM | POA: Diagnosis not present

## 2018-12-08 DIAGNOSIS — Z992 Dependence on renal dialysis: Secondary | ICD-10-CM | POA: Diagnosis not present

## 2018-12-08 DIAGNOSIS — N186 End stage renal disease: Secondary | ICD-10-CM | POA: Diagnosis not present

## 2018-12-09 DIAGNOSIS — N2581 Secondary hyperparathyroidism of renal origin: Secondary | ICD-10-CM | POA: Diagnosis not present

## 2018-12-09 DIAGNOSIS — Z992 Dependence on renal dialysis: Secondary | ICD-10-CM | POA: Diagnosis not present

## 2018-12-09 DIAGNOSIS — D631 Anemia in chronic kidney disease: Secondary | ICD-10-CM | POA: Diagnosis not present

## 2018-12-09 DIAGNOSIS — N186 End stage renal disease: Secondary | ICD-10-CM | POA: Diagnosis not present

## 2018-12-14 DIAGNOSIS — N186 End stage renal disease: Secondary | ICD-10-CM | POA: Diagnosis not present

## 2018-12-14 DIAGNOSIS — D631 Anemia in chronic kidney disease: Secondary | ICD-10-CM | POA: Diagnosis not present

## 2018-12-14 DIAGNOSIS — Z992 Dependence on renal dialysis: Secondary | ICD-10-CM | POA: Diagnosis not present

## 2018-12-14 DIAGNOSIS — N2581 Secondary hyperparathyroidism of renal origin: Secondary | ICD-10-CM | POA: Diagnosis not present

## 2018-12-17 DIAGNOSIS — N186 End stage renal disease: Secondary | ICD-10-CM | POA: Diagnosis not present

## 2018-12-17 DIAGNOSIS — Z992 Dependence on renal dialysis: Secondary | ICD-10-CM | POA: Diagnosis not present

## 2018-12-18 DIAGNOSIS — N186 End stage renal disease: Secondary | ICD-10-CM | POA: Diagnosis not present

## 2018-12-18 DIAGNOSIS — Z992 Dependence on renal dialysis: Secondary | ICD-10-CM | POA: Diagnosis not present

## 2018-12-21 DIAGNOSIS — Z992 Dependence on renal dialysis: Secondary | ICD-10-CM | POA: Diagnosis not present

## 2018-12-21 DIAGNOSIS — N186 End stage renal disease: Secondary | ICD-10-CM | POA: Diagnosis not present

## 2018-12-22 DIAGNOSIS — E8770 Fluid overload, unspecified: Secondary | ICD-10-CM | POA: Diagnosis not present

## 2018-12-22 DIAGNOSIS — N186 End stage renal disease: Secondary | ICD-10-CM | POA: Diagnosis not present

## 2018-12-22 DIAGNOSIS — Z992 Dependence on renal dialysis: Secondary | ICD-10-CM | POA: Diagnosis not present

## 2018-12-23 DIAGNOSIS — Z992 Dependence on renal dialysis: Secondary | ICD-10-CM | POA: Diagnosis not present

## 2018-12-23 DIAGNOSIS — N186 End stage renal disease: Secondary | ICD-10-CM | POA: Diagnosis not present

## 2018-12-24 DIAGNOSIS — Z992 Dependence on renal dialysis: Secondary | ICD-10-CM | POA: Diagnosis not present

## 2018-12-24 DIAGNOSIS — N186 End stage renal disease: Secondary | ICD-10-CM | POA: Diagnosis not present

## 2018-12-25 DIAGNOSIS — Z992 Dependence on renal dialysis: Secondary | ICD-10-CM | POA: Diagnosis not present

## 2018-12-25 DIAGNOSIS — N186 End stage renal disease: Secondary | ICD-10-CM | POA: Diagnosis not present

## 2018-12-28 ENCOUNTER — Inpatient Hospital Stay: Payer: Medicare Other

## 2018-12-28 ENCOUNTER — Other Ambulatory Visit: Payer: Self-pay

## 2018-12-28 ENCOUNTER — Inpatient Hospital Stay
Admission: EM | Admit: 2018-12-28 | Discharge: 2018-12-30 | DRG: 640 | Disposition: A | Discharge status: Home / Self Care | Payer: Medicare Other | Attending: Internal Medicine | Admitting: Internal Medicine

## 2018-12-28 ENCOUNTER — Emergency Department: Payer: Medicare Other

## 2018-12-28 DIAGNOSIS — Z820 Family history of epilepsy and other diseases of the nervous system: Secondary | ICD-10-CM

## 2018-12-28 DIAGNOSIS — Z841 Family history of disorders of kidney and ureter: Secondary | ICD-10-CM

## 2018-12-28 DIAGNOSIS — Z794 Long term (current) use of insulin: Secondary | ICD-10-CM

## 2018-12-28 DIAGNOSIS — E785 Hyperlipidemia, unspecified: Secondary | ICD-10-CM | POA: Diagnosis present

## 2018-12-28 DIAGNOSIS — R52 Pain, unspecified: Secondary | ICD-10-CM | POA: Diagnosis not present

## 2018-12-28 DIAGNOSIS — I10 Essential (primary) hypertension: Secondary | ICD-10-CM | POA: Diagnosis not present

## 2018-12-28 DIAGNOSIS — Z7982 Long term (current) use of aspirin: Secondary | ICD-10-CM | POA: Diagnosis not present

## 2018-12-28 DIAGNOSIS — E875 Hyperkalemia: Secondary | ICD-10-CM | POA: Diagnosis present

## 2018-12-28 DIAGNOSIS — I132 Hypertensive heart and chronic kidney disease with heart failure and with stage 5 chronic kidney disease, or end stage renal disease: Secondary | ICD-10-CM | POA: Diagnosis present

## 2018-12-28 DIAGNOSIS — K8020 Calculus of gallbladder without cholecystitis without obstruction: Secondary | ICD-10-CM | POA: Diagnosis present

## 2018-12-28 DIAGNOSIS — Z833 Family history of diabetes mellitus: Secondary | ICD-10-CM

## 2018-12-28 DIAGNOSIS — N390 Urinary tract infection, site not specified: Secondary | ICD-10-CM | POA: Diagnosis present

## 2018-12-28 DIAGNOSIS — E1122 Type 2 diabetes mellitus with diabetic chronic kidney disease: Secondary | ICD-10-CM | POA: Diagnosis present

## 2018-12-28 DIAGNOSIS — R112 Nausea with vomiting, unspecified: Secondary | ICD-10-CM | POA: Diagnosis not present

## 2018-12-28 DIAGNOSIS — I255 Ischemic cardiomyopathy: Secondary | ICD-10-CM | POA: Diagnosis present

## 2018-12-28 DIAGNOSIS — J90 Pleural effusion, not elsewhere classified: Secondary | ICD-10-CM | POA: Diagnosis not present

## 2018-12-28 DIAGNOSIS — N2581 Secondary hyperparathyroidism of renal origin: Secondary | ICD-10-CM | POA: Diagnosis present

## 2018-12-28 DIAGNOSIS — Z8042 Family history of malignant neoplasm of prostate: Secondary | ICD-10-CM | POA: Diagnosis not present

## 2018-12-28 DIAGNOSIS — Z992 Dependence on renal dialysis: Secondary | ICD-10-CM | POA: Diagnosis not present

## 2018-12-28 DIAGNOSIS — I5023 Acute on chronic systolic (congestive) heart failure: Secondary | ICD-10-CM | POA: Diagnosis present

## 2018-12-28 DIAGNOSIS — N186 End stage renal disease: Secondary | ICD-10-CM | POA: Diagnosis present

## 2018-12-28 DIAGNOSIS — I2510 Atherosclerotic heart disease of native coronary artery without angina pectoris: Secondary | ICD-10-CM | POA: Diagnosis present

## 2018-12-28 DIAGNOSIS — N19 Unspecified kidney failure: Secondary | ICD-10-CM

## 2018-12-28 DIAGNOSIS — Z1159 Encounter for screening for other viral diseases: Secondary | ICD-10-CM

## 2018-12-28 DIAGNOSIS — R7989 Other specified abnormal findings of blood chemistry: Secondary | ICD-10-CM

## 2018-12-28 DIAGNOSIS — E119 Type 2 diabetes mellitus without complications: Secondary | ICD-10-CM | POA: Diagnosis not present

## 2018-12-28 DIAGNOSIS — I959 Hypotension, unspecified: Secondary | ICD-10-CM | POA: Diagnosis not present

## 2018-12-28 DIAGNOSIS — Z951 Presence of aortocoronary bypass graft: Secondary | ICD-10-CM | POA: Diagnosis not present

## 2018-12-28 DIAGNOSIS — I480 Paroxysmal atrial fibrillation: Secondary | ICD-10-CM | POA: Diagnosis present

## 2018-12-28 DIAGNOSIS — R0902 Hypoxemia: Secondary | ICD-10-CM | POA: Diagnosis not present

## 2018-12-28 DIAGNOSIS — Z8249 Family history of ischemic heart disease and other diseases of the circulatory system: Secondary | ICD-10-CM | POA: Diagnosis not present

## 2018-12-28 DIAGNOSIS — I340 Nonrheumatic mitral (valve) insufficiency: Secondary | ICD-10-CM | POA: Diagnosis not present

## 2018-12-28 DIAGNOSIS — I361 Nonrheumatic tricuspid (valve) insufficiency: Secondary | ICD-10-CM | POA: Diagnosis not present

## 2018-12-28 DIAGNOSIS — I252 Old myocardial infarction: Secondary | ICD-10-CM | POA: Diagnosis not present

## 2018-12-28 DIAGNOSIS — D631 Anemia in chronic kidney disease: Secondary | ICD-10-CM | POA: Diagnosis present

## 2018-12-28 DIAGNOSIS — J9811 Atelectasis: Secondary | ICD-10-CM | POA: Diagnosis not present

## 2018-12-28 DIAGNOSIS — R1084 Generalized abdominal pain: Secondary | ICD-10-CM | POA: Diagnosis not present

## 2018-12-28 LAB — CBC
HCT: 33.3 % — ABNORMAL LOW (ref 36.0–46.0)
Hemoglobin: 11.1 g/dL — ABNORMAL LOW (ref 12.0–15.0)
MCH: 30.3 pg (ref 26.0–34.0)
MCHC: 33.3 g/dL (ref 30.0–36.0)
MCV: 91 fL (ref 80.0–100.0)
Platelets: 273 10*3/uL (ref 150–400)
RBC: 3.66 MIL/uL — ABNORMAL LOW (ref 3.87–5.11)
RDW: 18.1 % — ABNORMAL HIGH (ref 11.5–15.5)
WBC: 11.2 10*3/uL — ABNORMAL HIGH (ref 4.0–10.5)
nRBC: 0 % (ref 0.0–0.2)

## 2018-12-28 LAB — LIPASE, BLOOD: Lipase: 19 U/L (ref 11–51)

## 2018-12-28 LAB — HEMOGLOBIN A1C
Hgb A1c MFr Bld: 9.5 % — ABNORMAL HIGH (ref 4.8–5.6)
Mean Plasma Glucose: 225.95 mg/dL

## 2018-12-28 LAB — COMPREHENSIVE METABOLIC PANEL
ALT: 22 U/L (ref 0–44)
AST: 27 U/L (ref 15–41)
Albumin: 2.9 g/dL — ABNORMAL LOW (ref 3.5–5.0)
Alkaline Phosphatase: 481 U/L — ABNORMAL HIGH (ref 38–126)
Anion gap: 26 — ABNORMAL HIGH (ref 5–15)
BUN: 97 mg/dL — ABNORMAL HIGH (ref 8–23)
CO2: 13 mmol/L — ABNORMAL LOW (ref 22–32)
Calcium: 7.8 mg/dL — ABNORMAL LOW (ref 8.9–10.3)
Chloride: 92 mmol/L — ABNORMAL LOW (ref 98–111)
Creatinine, Ser: 9.92 mg/dL — ABNORMAL HIGH (ref 0.44–1.00)
GFR calc Af Amer: 4 mL/min — ABNORMAL LOW (ref >60)
GFR calc non Af Amer: 4 mL/min — ABNORMAL LOW (ref >60)
Glucose, Bld: 304 mg/dL — ABNORMAL HIGH (ref 70–99)
Potassium: 6.8 mmol/L (ref 3.5–5.1)
Sodium: 131 mmol/L — ABNORMAL LOW (ref 135–145)
Total Bilirubin: 2 mg/dL — ABNORMAL HIGH (ref 0.3–1.2)
Total Protein: 6.1 g/dL — ABNORMAL LOW (ref 6.5–8.1)

## 2018-12-28 LAB — URINALYSIS, COMPLETE (UACMP) WITH MICROSCOPIC
Glucose, UA: 100 mg/dL — AB
Ketones, ur: 15 mg/dL — AB
Nitrite: NEGATIVE
Specific Gravity, Urine: 1.025 (ref 1.005–1.030)
WBC, UA: >50 WBC/hpf — ABNORMAL HIGH (ref 0–5)
pH: 5.5 (ref 5.0–8.0)

## 2018-12-28 LAB — PHOSPHORUS: Phosphorus: 6.7 mg/dL — ABNORMAL HIGH (ref 2.5–4.6)

## 2018-12-28 LAB — MRSA PCR SCREENING: MRSA by PCR: NEGATIVE

## 2018-12-28 LAB — GLUCOSE, CAPILLARY
Glucose-Capillary: 305 mg/dL — ABNORMAL HIGH (ref 70–99)
Glucose-Capillary: 253 mg/dL — ABNORMAL HIGH (ref 70–99)

## 2018-12-28 LAB — SARS CORONAVIRUS 2 BY RT PCR (HOSPITAL ORDER, PERFORMED IN ~~LOC~~ HOSPITAL LAB): SARS Coronavirus 2: NEGATIVE

## 2018-12-28 MED ORDER — HEPARIN SODIUM (PORCINE) 1000 UNIT/ML DIALYSIS: 1000.0000 [IU] | INTRAMUSCULAR | Status: DC | PRN

## 2018-12-28 MED ORDER — HEPARIN SODIUM (PORCINE) 5000 UNIT/ML IJ SOLN
5000.0000 [IU] | Freq: Three times a day (TID) | INTRAMUSCULAR | Status: DC
  Administered 2018-12-28 – 2018-12-30 (×5): 5000 [IU] via SUBCUTANEOUS
  Filled 2018-12-28 (×5): qty 1

## 2018-12-28 MED ORDER — SODIUM ZIRCONIUM CYCLOSILICATE 10 G PO PACK
10.0000 g | PACK | Freq: Once | ORAL | Status: AC
  Administered 2018-12-28: 10 g via ORAL
  Filled 2018-12-28: qty 1

## 2018-12-28 MED ORDER — RENA-VITE PO TABS
1.0000 | ORAL_TABLET | Freq: Every day | ORAL | Status: DC
  Administered 2018-12-29: 09:00:00 1 via ORAL
  Filled 2018-12-28: qty 1

## 2018-12-28 MED ORDER — CHLORHEXIDINE GLUCONATE CLOTH 2 % EX PADS
6.0000 | MEDICATED_PAD | Freq: Every day | CUTANEOUS | Status: DC
  Administered 2018-12-29 – 2018-12-30 (×2): 6 via TOPICAL

## 2018-12-28 MED ORDER — SODIUM CHLORIDE 0.9 % IV SOLN: 100.0000 mL | INTRAVENOUS | Status: DC | PRN

## 2018-12-28 MED ORDER — CARVEDILOL 6.25 MG PO TABS
6.2500 mg | ORAL_TABLET | Freq: Two times a day (BID) | ORAL | Status: DC
  Administered 2018-12-28 – 2018-12-29 (×3): 6.25 mg via ORAL
  Filled 2018-12-28 (×4): qty 1

## 2018-12-28 MED ORDER — PENTAFLUOROPROP-TETRAFLUOROETH EX AERO
1.0000 "application " | INHALATION_SPRAY | CUTANEOUS | Status: DC | PRN
  Filled 2018-12-28: qty 30

## 2018-12-28 MED ORDER — ACETAMINOPHEN 325 MG PO TABS: 650.0000 mg | ORAL_TABLET | Freq: Four times a day (QID) | ORAL | Status: DC | PRN

## 2018-12-28 MED ORDER — CALCIUM GLUCONATE-NACL 1-0.675 GM/50ML-% IV SOLN
1.0000 g | Freq: Once | INTRAVENOUS | Status: AC
  Administered 2018-12-28: 14:00:00 1000 mg via INTRAVENOUS
  Filled 2018-12-28: qty 50

## 2018-12-28 MED ORDER — SODIUM CHLORIDE 0.9 % IV SOLN
1.0000 g | INTRAVENOUS | Status: DC
  Administered 2018-12-29: 10:00:00 1 g via INTRAVENOUS
  Filled 2018-12-28 (×2): qty 10

## 2018-12-28 MED ORDER — LIDOCAINE-PRILOCAINE 2.5-2.5 % EX CREA
1.0000 "application " | TOPICAL_CREAM | CUTANEOUS | Status: DC | PRN
  Filled 2018-12-28: qty 5

## 2018-12-28 MED ORDER — LIDOCAINE HCL (PF) 1 % IJ SOLN: 5.0000 mL | INTRAMUSCULAR | Status: DC | PRN

## 2018-12-28 MED ORDER — ACETAMINOPHEN 650 MG RE SUPP: 650.0000 mg | Freq: Four times a day (QID) | RECTAL | Status: DC | PRN

## 2018-12-28 MED ORDER — CALCIUM ACETATE (PHOS BINDER) 667 MG PO CAPS
667.0000 mg | ORAL_CAPSULE | Freq: Two times a day (BID) | ORAL | Status: DC
  Administered 2018-12-28 – 2018-12-29 (×3): 667 mg via ORAL
  Filled 2018-12-28 (×3): qty 1

## 2018-12-28 MED ORDER — HYDROCODONE-ACETAMINOPHEN 5-325 MG PO TABS: 1.0000 | ORAL_TABLET | Freq: Three times a day (TID) | ORAL | Status: DC | PRN

## 2018-12-28 MED ORDER — SODIUM CHLORIDE 0.9 % IV SOLN
1.0000 g | Freq: Once | INTRAVENOUS | Status: AC
  Administered 2018-12-28: 13:00:00 1 g via INTRAVENOUS
  Filled 2018-12-28: qty 10

## 2018-12-28 MED ORDER — ALTEPLASE 2 MG IJ SOLR: 2.0000 mg | Freq: Once | INTRAMUSCULAR | Status: DC | PRN

## 2018-12-28 MED ORDER — DEXTROSE 50 % IV SOLN
12.5000 g | Freq: Once | INTRAVENOUS | Status: AC
  Administered 2018-12-28: 12.5 g via INTRAVENOUS
  Filled 2018-12-28: qty 50

## 2018-12-28 MED ORDER — ONDANSETRON HCL 4 MG/2ML IJ SOLN
4.0000 mg | Freq: Once | INTRAMUSCULAR | Status: AC
  Administered 2018-12-28: 11:00:00 4 mg via INTRAVENOUS
  Filled 2018-12-28: qty 2

## 2018-12-28 MED ORDER — ATORVASTATIN CALCIUM 20 MG PO TABS
80.0000 mg | ORAL_TABLET | Freq: Every evening | ORAL | Status: DC
  Administered 2018-12-28 – 2018-12-29 (×2): 80 mg via ORAL
  Filled 2018-12-28 (×2): qty 4

## 2018-12-28 MED ORDER — SODIUM CHLORIDE 0.9% FLUSH
3.0000 mL | Freq: Once | INTRAVENOUS | Status: AC
  Administered 2018-12-28: 11:00:00 3 mL via INTRAVENOUS

## 2018-12-28 MED ORDER — ASPIRIN EC 81 MG PO TBEC
81.0000 mg | DELAYED_RELEASE_TABLET | Freq: Every day | ORAL | Status: DC
  Administered 2018-12-29 – 2018-12-30 (×2): 81 mg via ORAL
  Filled 2018-12-28 (×2): qty 1

## 2018-12-28 MED ORDER — HYDRALAZINE HCL 25 MG PO TABS
25.0000 mg | ORAL_TABLET | Freq: Two times a day (BID) | ORAL | Status: DC
  Filled 2018-12-28: qty 1

## 2018-12-28 MED ORDER — ONDANSETRON HCL 4 MG PO TABS: 4.0000 mg | ORAL_TABLET | Freq: Four times a day (QID) | ORAL | Status: DC | PRN

## 2018-12-28 MED ORDER — INSULIN ASPART 100 UNIT/ML ~~LOC~~ SOLN
0.0000 [IU] | Freq: Every day | SUBCUTANEOUS | Status: DC
  Administered 2018-12-28: 21:00:00 3 [IU] via SUBCUTANEOUS
  Administered 2018-12-29: 22:00:00 2 [IU] via SUBCUTANEOUS
  Filled 2018-12-28 (×2): qty 1

## 2018-12-28 MED ORDER — INSULIN GLARGINE 100 UNIT/ML ~~LOC~~ SOLN
16.0000 [IU] | Freq: Every day | SUBCUTANEOUS | Status: DC
  Administered 2018-12-29: 22:00:00 16 [IU] via SUBCUTANEOUS
  Filled 2018-12-28 (×3): qty 0.16

## 2018-12-28 MED ORDER — INSULIN ASPART 100 UNIT/ML ~~LOC~~ SOLN
10.0000 [IU] | Freq: Once | SUBCUTANEOUS | Status: AC
  Administered 2018-12-28: 10 [IU] via INTRAVENOUS
  Filled 2018-12-28: qty 1

## 2018-12-28 MED ORDER — INSULIN ASPART 100 UNIT/ML ~~LOC~~ SOLN
0.0000 [IU] | Freq: Three times a day (TID) | SUBCUTANEOUS | Status: DC
  Administered 2018-12-29: 08:00:00 2 [IU] via SUBCUTANEOUS
  Administered 2018-12-29: 3 [IU] via SUBCUTANEOUS
  Administered 2018-12-29: 12:00:00 2 [IU] via SUBCUTANEOUS
  Filled 2018-12-28 (×3): qty 1

## 2018-12-28 MED ORDER — ONDANSETRON HCL 4 MG/2ML IJ SOLN
4.0000 mg | Freq: Four times a day (QID) | INTRAMUSCULAR | Status: DC | PRN
  Administered 2018-12-28: 4 mg via INTRAVENOUS
  Filled 2018-12-28: qty 2

## 2018-12-28 NOTE — ED Notes (Signed)
Pt with green bile vomit x 1. Calcium gluconate not sent from pharmacy at present. Note sent.
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
## 2018-12-28 NOTE — ED Notes (Signed)
Pt transported to Dialysis ?

## 2018-12-28 NOTE — ED Notes (Signed)
Pt transported to dialysis

## 2018-12-28 NOTE — ED Triage Notes (Signed)
Pt from dialysis center parking lot. Pt with c/o nausea and dry heaves. Last dialysis Tuesday.
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
## 2018-12-28 NOTE — ED Notes (Signed)
Pharmacy called for missing calcium gluconate

## 2018-12-28 NOTE — ED Notes (Signed)
Date and time results received: 12/28/18 1124 (use smartphrase ".now" to insert current time)  Test: K Critical Value: 6.8  Name of Provider Notified: Paduchowski  Orders Received? Or Actions Taken?: MD aware

## 2018-12-28 NOTE — Progress Notes (Signed)
Patient ID: Theresa Rogers, female   DOB: [birth year], 68 y.o.   MRN: [id number]  ACP note  Patient present  Patient coming in with nausea vomiting and diarrhea.  Abdominal bloating.  Patient also shortness of breath with walking.  States she is gained some fluid.  She is feeling very fatigued.  She recently converted from hemodialysis over to peritoneal dialysis and doing training last week.  In the ER she was found to have hyperkalemia and a urinary infection.  CODE STATUS discussed and patient wishes to be a full code.  Medical problems include hyperkalemia, end-stage renal disease, acute on chronic systolic congestive heart failure, type 2 diabetes, hypertension  Plan.   For her hyperkalemia stat medications were given such as Lokelma, calcium, bicarb and insulin.  She will get hemodialysis today. Rapid COVID-19 testing sent off. For her acute on chronic systolic congestive heart failure dialysis will help manage fluid.  If she is going to continue on peritoneal dialysis she will have to go on Lasix or torsemide.  Patient already on beta-blocker.  Likely will have to add ACE inhibitor or ARB. For her nausea vomiting and diarrhea I will send off some stool studies.  Also get a right upper quadrant sonogram with her elevated alkaline phosphatase and bilirubin.  Time spent on ACP discussion 17 minutes Dr Loletha Grayer

## 2018-12-28 NOTE — Progress Notes (Signed)
Hemodialysis treatment completed at 1715. Net UF removed 2.5 L. Blood rinsed back, CVC flushed with NS and locked with heparin. Red caps replaced. CVC dressing changed and insertion site remains without signs and symptoms of infection.  Post hemodialysis report given to M. Vidal Schwalbe, RN.    12/28/18 1715  Hand-Off documentation  Report given to (Full Name) M. Tumey, RN  Vital Signs  Temp 97.9 F (36.6 C)  Temp Source Oral  Pulse Rate 78  Pulse Rate Source Monitor  Resp 19  BP 110/62  BP Location Right Arm  BP Method Automatic  Patient Position (if appropriate) Lying  Oxygen Therapy  SpO2 95 %  O2 Device Room Air  Pain Assessment  Pain Scale 0-10  Pain Score 0  During Hemodialysis Assessment  Dialysis Fluid Bolus Normal Saline  Bolus Amount (mL) 250 mL  Intra-Hemodialysis Comments Tx completed  Post-Hemodialysis Assessment  Rinseback Volume (mL) 250 mL  KECN 68.7 V  Dialyzer Clearance Lightly streaked  Duration of HD Treatment -hour(s) 3 hour(s)  Hemodialysis Intake (mL) 500 mL  UF Total -Machine (mL) 3048 mL  Net UF (mL) 2548 mL  Tolerated HD Treatment Yes  Education / Care Plan  Dialysis Education Provided Yes  Documented Education in Care Plan Yes  Outpatient Plan of Care Reviewed and on Chart Yes

## 2018-12-29 ENCOUNTER — Inpatient Hospital Stay (HOSPITAL_COMMUNITY)
Admit: 2018-12-29 | Discharge: 2018-12-29 | Disposition: A | Discharge status: Home / Self Care | Payer: Medicare Other | Attending: Internal Medicine | Admitting: Internal Medicine

## 2018-12-29 DIAGNOSIS — I340 Nonrheumatic mitral (valve) insufficiency: Secondary | ICD-10-CM

## 2018-12-29 DIAGNOSIS — I361 Nonrheumatic tricuspid (valve) insufficiency: Secondary | ICD-10-CM

## 2018-12-29 LAB — GASTROINTESTINAL PANEL BY PCR, STOOL (REPLACES STOOL CULTURE)

## 2018-12-29 LAB — C DIFFICILE QUICK SCREEN W PCR REFLEX
C Diff antigen: NEGATIVE
C Diff interpretation: NOT DETECTED
C Diff toxin: NEGATIVE

## 2018-12-29 LAB — CBC
HCT: 29.8 % — ABNORMAL LOW (ref 36.0–46.0)
Hemoglobin: 9.8 g/dL — ABNORMAL LOW (ref 12.0–15.0)
MCH: 30.3 pg (ref 26.0–34.0)
MCHC: 32.9 g/dL (ref 30.0–36.0)
MCV: 92.3 fL (ref 80.0–100.0)
Platelets: 200 10*3/uL (ref 150–400)
RBC: 3.23 MIL/uL — ABNORMAL LOW (ref 3.87–5.11)
RDW: 18.1 % — ABNORMAL HIGH (ref 11.5–15.5)
WBC: 9.5 10*3/uL (ref 4.0–10.5)
nRBC: 0.2 % (ref 0.0–0.2)

## 2018-12-29 LAB — ECHOCARDIOGRAM COMPLETE
Height: 64 in
Weight: 2272 oz

## 2018-12-29 LAB — BASIC METABOLIC PANEL
Anion gap: 18 — ABNORMAL HIGH (ref 5–15)
BUN: 49 mg/dL — ABNORMAL HIGH (ref 8–23)
CO2: 23 mmol/L (ref 22–32)
Calcium: 7.5 mg/dL — ABNORMAL LOW (ref 8.9–10.3)
Chloride: 96 mmol/L — ABNORMAL LOW (ref 98–111)
Creatinine, Ser: 5.99 mg/dL — ABNORMAL HIGH (ref 0.44–1.00)
GFR calc Af Amer: 8 mL/min — ABNORMAL LOW (ref >60)
GFR calc non Af Amer: 7 mL/min — ABNORMAL LOW (ref >60)
Glucose, Bld: 216 mg/dL — ABNORMAL HIGH (ref 70–99)
Potassium: 4.4 mmol/L (ref 3.5–5.1)
Sodium: 137 mmol/L (ref 135–145)

## 2018-12-29 LAB — GLUCOSE, CAPILLARY
Glucose-Capillary: 187 mg/dL — ABNORMAL HIGH (ref 70–99)
Glucose-Capillary: 186 mg/dL — ABNORMAL HIGH (ref 70–99)
Glucose-Capillary: 248 mg/dL — ABNORMAL HIGH (ref 70–99)
Glucose-Capillary: 204 mg/dL — ABNORMAL HIGH (ref 70–99)

## 2018-12-29 MED ORDER — DIPHENOXYLATE-ATROPINE 2.5-0.025 MG PO TABS
2.0000 | ORAL_TABLET | Freq: Four times a day (QID) | ORAL | Status: DC | PRN
  Administered 2018-12-29: 17:00:00 2 via ORAL
  Filled 2018-12-29 (×2): qty 2

## 2018-12-29 NOTE — Progress Notes (Signed)
[facility]  ROUNDING NOTE   Subjective:  Patient has been undergoing peritoneal dialysis training however this has not been going very well. Patient developed shortness of breath and nausea with fluid in her peritoneal cavity. Also has had some diarrhea recently. Patient did undergo dialysis treatment yesterday.   Objective:  Vital signs in last 24 hours:  Temp:  [97.6 F (36.4 C)-98.5 F (36.9 C)] 97.6 F (36.4 C) (07/21 1134) Pulse Rate:  [54-93] 54 (07/21 1134) Resp:  [16-183] 18 (07/21 1134) BP: (97-149)/(45-81) 111/68 (07/21 1134) SpO2:  [92 %-97 %] 93 % (07/21 1134)  Weight change:  Filed Weights   12/28/18 1004  Weight: 64.4 kg    Intake/Output: I/O last 3 completed shifts: In: 150 [IV Piggyback:150] Out: 2548 [Other:2548]   Intake/Output this shift:  Total I/O In: 240 [P.O.:240] Out: -   Physical Exam: General: No acute distress  Head: Normocephalic, atraumatic. Moist oral mucosal membranes  Eyes: Anicteric  Neck: Supple, trachea midline  Lungs:  Clear to auscultation, normal effort  Heart: S1S2 no rubs  Abdomen:  Soft, nontender, bowel sounds present  Extremities: 1+ peripheral edema.  Neurologic: Awake, alert, following commands  Skin: No lesions  Access: Permacath in place, PD catheter in place    Basic Metabolic Panel: Recent Labs  Lab 12/28/18 1013 12/28/18 2233 12/29/18 0310  NA 131*  --  137  K 6.8*  --  4.4  CL 92*  --  96*  CO2 13*  --  23  GLUCOSE 304*  --  216*  BUN 97*  --  49*  CREATININE 9.92*  --  5.99*  CALCIUM 7.8*  --  7.5*  PHOS  --  6.7*  --     Liver Function Tests: Recent Labs  Lab 12/28/18 1013  AST 27  ALT 22  ALKPHOS 481*  BILITOT 2.0*  PROT 6.1*  ALBUMIN 2.9*   Recent Labs  Lab 12/28/18 1013  LIPASE 19   No results for input(s): AMMONIA in the last 168 hours.  CBC: Recent Labs  Lab 12/28/18 1013 12/29/18 0310  WBC 11.2* 9.5  HGB 11.1* 9.8*  HCT 33.3* 29.8*  MCV 91.0 92.3   PLT 273 200    Cardiac Enzymes: No results for input(s): CKTOTAL, CKMB, CKMBINDEX, TROPONINI in the last 168 hours.  BNP: Invalid input(s): POCBNP  CBG: Recent Labs  Lab 12/28/18 1329 12/28/18 2005 12/29/18 0738 12/29/18 1132  GLUCAP 305* 253* 187* 186*    Microbiology: Results for orders placed or performed during the hospital encounter of 12/28/18  SARS Coronavirus 2 (CEPHEID - Performed in [place] hospital lab), Hosp Order     Status: None   Collection Time: 12/28/18 12:38 PM   Specimen: Nasopharyngeal Swab  Result Value Ref Range Status   SARS Coronavirus 2 NEGATIVE NEGATIVE Final    Comment: (NOTE) If result is NEGATIVE SARS-CoV-2 target nucleic acids are NOT DETECTED. The SARS-CoV-2 RNA is generally detectable in upper and lower  respiratory specimens during the acute phase of infection. The lowest  concentration of SARS-CoV-2 viral copies this assay can detect is 250  copies / mL. A negative result does not preclude SARS-CoV-2 infection  and should not be used as the sole basis for treatment or other  patient management decisions.  A negative result may occur with  improper specimen collection / handling, submission of specimen other  than nasopharyngeal swab, presence of viral mutation(s) within the  areas targeted by this assay, and inadequate number  of viral copies  (<250 copies / mL). A negative result must be combined with clinical  observations, patient history, and epidemiological information. If result is POSITIVE SARS-CoV-2 target nucleic acids are DETECTED. The SARS-CoV-2 RNA is generally detectable in upper and lower  respiratory specimens dur ing the acute phase of infection.  Positive  results are indicative of active infection with SARS-CoV-2.  Clinical  correlation with patient history and other diagnostic information is  necessary to determine patient infection status.  Positive results do  not rule out bacterial infection or co-infection  with other viruses. If result is PRESUMPTIVE POSTIVE SARS-CoV-2 nucleic acids MAY BE PRESENT.   A presumptive positive result was obtained on the submitted specimen  and confirmed on repeat testing.  While 2019 novel coronavirus  (SARS-CoV-2) nucleic acids may be present in the submitted sample  additional confirmatory testing may be necessary for epidemiological  and / or clinical management purposes  to differentiate between  SARS-CoV-2 and other Sarbecovirus currently known to infect humans.  If clinically indicated additional testing with an alternate test  methodology [phone]) is advised. The SARS-CoV-2 RNA is generally  detectable in upper and lower respiratory sp ecimens during the acute  phase of infection. The expected result is Negative. Fact Sheet for Patients:  StrictlyIdeas.no Fact Sheet for Healthcare Providers: BankingDealers.co.za This test is not yet approved or cleared by the [place] FDA and has been authorized for detection and/or diagnosis of SARS-CoV-2 by FDA under an Emergency Use Authorization (EUA).  This EUA will remain in effect (meaning this test can be used) for the duration of the COVID-19 declaration under Section 564(b)(1) of the Act, 21 U.S.C. section 360bbb-3(b)(1), unless the authorization is terminated or revoked sooner. Performed at [facility], [address] [place], [place] [address]   MRSA PCR Screening     Status: None   Collection Time: 12/28/18  8:29 PM   Specimen: Nasal Mucosa; Nasopharyngeal  Result Value Ref Range Status   MRSA by PCR NEGATIVE NEGATIVE Final    Comment:        The GeneXpert MRSA Assay (FDA approved for NASAL specimens only), is one component of a comprehensive MRSA colonization surveillance program. It is not intended to diagnose MRSA infection nor to guide or monitor treatment for MRSA infections. Performed at [facility], [address] [place], [place] [address]   Gastrointestinal Panel by PCR , Stool     Status: None   Collection Time: 12/28/18 11:21 PM   Specimen: Stool  Result Value Ref Range Status   Campylobacter species NOT DETECTED NOT DETECTED Final   Plesimonas shigelloides NOT DETECTED NOT DETECTED Final   Salmonella species NOT DETECTED NOT DETECTED Final   Yersinia enterocolitica NOT DETECTED NOT DETECTED Final   Vibrio species NOT DETECTED NOT DETECTED Final   Vibrio cholerae NOT DETECTED NOT DETECTED Final   Enteroaggregative E coli (EAEC) NOT DETECTED NOT DETECTED Final   Enteropathogenic E coli (EPEC) NOT DETECTED NOT DETECTED Final   Enterotoxigenic E coli (ETEC) NOT DETECTED NOT DETECTED Final   Shiga like toxin producing E coli (STEC) NOT DETECTED NOT DETECTED Final   Shigella/Enteroinvasive E coli (EIEC) NOT DETECTED NOT DETECTED Final   Cryptosporidium NOT DETECTED NOT DETECTED Final   Cyclospora cayetanensis NOT DETECTED NOT DETECTED Final   Entamoeba histolytica NOT DETECTED NOT DETECTED Final   Giardia lamblia NOT DETECTED NOT DETECTED Final   Adenovirus F40/41 NOT DETECTED NOT DETECTED Final   Astrovirus NOT DETECTED  NOT DETECTED Final   Norovirus GI/GII NOT DETECTED NOT DETECTED Final   Rotavirus A NOT DETECTED NOT DETECTED Final   Sapovirus (I, II, IV, and V) NOT DETECTED NOT DETECTED Final    Comment: Performed at [facility], [address] [place], [place] [address]  C difficile quick scan w PCR reflex     Status: None   Collection Time: 12/28/18 11:22 PM   Specimen: Stool  Result Value Ref Range Status   C Diff antigen NEGATIVE NEGATIVE Final   C Diff toxin NEGATIVE NEGATIVE Final   C Diff interpretation No C. difficile detected.  Final    Comment: Performed at [facility], [address] [place], [place] [address]    Coagulation Studies: No results for input(s): LABPROT, INR in the last 72 hours.  Urinalysis: Recent Labs    12/28/18 1056   COLORURINE YELLOW  LABSPEC 1.025  PHURINE 5.5  GLUCOSEU 100*  HGBUR MODERATE*  BILIRUBINUR SMALL*  KETONESUR 15*  PROTEINUR LARGE*  NITRITE NEGATIVE  LEUKOCYTESUR SMALL*      Imaging: Dg Chest Portable 1 View  Result Date: 12/28/2018 CLINICAL DATA:  Missed dialysis.  Nausea EXAM: PORTABLE CHEST 1 VIEW COMPARISON:  11/20/2018 FINDINGS: Right dialysis catheter remains in place, unchanged. Cardiomegaly, vascular congestion. Small to moderate right effusion. Bibasilar linear opacities, likely atelectasis. No acute bony abnormality. IMPRESSION: Small to moderate right effusion, similar prior study. Bibasilar atelectasis. Cardiomegaly, vascular congestion. Electronically Signed   By: Rolm Baptise M.D.   On: 12/28/2018 10:54   US Abdomen Limited Ruq  Result Date: 12/28/2018 CLINICAL DATA:  Elevated liver function tests. EXAM: ULTRASOUND ABDOMEN LIMITED RIGHT UPPER QUADRANT COMPARISON:  CT scan of the abdomen dated 11/20/2018 FINDINGS: Gallbladder: Gallbladder wall is thickened to 6 mm. Dependent echogenic material consistent with stones with only minimal posterior acoustical shadowing. This correlates with the multiple calcifications in the gallbladder on the prior CT scan. Negative sonographic Murphy's sign. Small polyps in the gallbladder. Common bile duct: Diameter: 5.1 mm, normal. Areas of abnormal echogenicity in the biliary tree consistent with tear. Liver: No focal lesion identified. Slight diffuse increased echogenicity of the liver parenchyma. Portal vein is patent on color Doppler imaging with normal direction of blood flow towards the liver. Right pleural effusion. Small amount of ascites. Small echogenic right kidney. IMPRESSION: 1. Cholelithiasis with a thickened gallbladder wall. 2. Persistent right effusion.  New ascites. 3. Probable air in the biliary tree of unknown etiology, new since the prior CT scan. 4. Slight increased echogenicity of the liver parenchyma which could represent  hepatic steatosis. 5. Echogenic right kidney consistent with renal medical disease. Electronically Signed   By: Lorriane Shire M.D.   On: 12/28/2018 13:49     Medications:   . cefTRIAXone (ROCEPHIN)  IV 1 g (12/29/18 5631)   . aspirin EC  81 mg Oral Daily  . atorvastatin  80 mg Oral QPM  . calcium acetate  667 mg Oral BID WC  . carvedilol  6.25 mg Oral BID  . Chlorhexidine Gluconate Cloth  6 each Topical Q0600  . heparin  5,000 Units Subcutaneous Q8H  . insulin aspart  0-5 Units Subcutaneous QHS  . insulin aspart  0-9 Units Subcutaneous TID WC  . insulin glargine  16 Units Subcutaneous QHS  . multivitamin  1 tablet Oral Daily   acetaminophen **OR** acetaminophen, HYDROcodone-acetaminophen, lidocaine-prilocaine, ondansetron **OR** ondansetron (ZOFRAN) IV, pentafluoroprop-tetrafluoroeth  Assessment/ Plan:  68 y.o. female with past medical history of coronary artery  disease status post CABG and multiple cardiac catheterizations, chronic systolic heart failure, diabetes mellitus type 2, ESRD on HD, hyperlipidemia, hypertension, myocardial infarction, paroxysmal atrial fibrillation, anemia chronic kidney disease, secondary hyperparathyroidism who presents status post fall after recent PD catheter placement.  Theresa Rogers/MWF/IJ permcath/PD catheter/64.5  1.  ESRD on HD MWF.    Patient underwent hemodialysis yesterday.  She has been undergoing peritoneal dialysis training however she has not tolerated this very well as she becomes short of breath when she has fluid in her peritoneal cavity.  She is also had nausea and diarrhea.  We will plan for hemodialysis again tomorrow.  Patient not yet ready to give up on PD training.  2.  Anemia of chronic kidney disease.    Hemoglobin 9.8.  Consider Epogen tomorrow.  3.  Secondary hyperparathyroidism.    Phosphorus was 6.7 yesterday.  Repeat this tomorrow.  Otherwise continue calcium acetate.  4.  Hypertension.    Maintain the patient on  carvedilol at this time.   LOS: 1 Leopoldo Mazzie 7/21/202011:56 AM

## 2018-12-29 NOTE — Progress Notes (Addendum)
Established peritoneal dialysis patient known at [facility]. Please note any change in COVID or Mobility may affect this plan. Please contact me directly for any dialysis placement concerns.  Elvera Bicker Dialysis Coordinator  [phone]

## 2018-12-29 NOTE — Progress Notes (Signed)
Theresa Rogers at [facility] NAME: Theresa Rogers    MR#:  [id number]  DATE OF BIRTH:  20-Sep-1950  SUBJECTIVE:   Patient came in with generalized weakness diarrheal stools for several days after dialysis poor PO intake and dry heaves. She did eat some breakfast this morning. Patient is undergoing training for peritoneal dialysis.  Found to have potassium of 6.8. She underwent hemodialysis yesterday. Potassium is normalized. REVIEW OF SYSTEMS:   Review of Systems  Constitutional: Negative for chills, fever and weight loss.  HENT: Negative for ear discharge, ear pain and nosebleeds.   Eyes: Negative for blurred vision, pain and discharge.  Respiratory: Negative for sputum production, shortness of breath, wheezing and stridor.   Cardiovascular: Negative for chest pain, palpitations, orthopnea and PND.  Gastrointestinal: Positive for diarrhea and nausea. Negative for abdominal pain and vomiting.  Genitourinary: Negative for frequency and urgency.  Musculoskeletal: Negative for back pain and joint pain.  Neurological: Positive for weakness. Negative for sensory change, speech change and focal weakness.  Psychiatric/Behavioral: Negative for depression and hallucinations. The patient is not nervous/anxious.    Tolerating Diet:some Tolerating PT: weak  DRUG ALLERGIES:   Allergies  Allergen Reactions  . No Known Allergies     VITALS:  Blood pressure 111/68, pulse (!) 54, temperature 97.6 F (36.4 C), resp. rate 18, height 5\' 4"  (1.626 m), weight 64.4 kg, SpO2 93 %.  PHYSICAL EXAMINATION:   Physical Exam  GENERAL:  68 y.o.-year-old patient lying in the bed with no acute distress. Appears chronically ill EYES: Pupils equal, round, reactive to light and accommodation. No scleral icterus. Extraocular muscles intact.  HEENT: Head atraumatic, normocephalic. Oropharynx and nasopharynx clear.  NECK:  Supple, no jugular venous distention. No  thyroid enlargement, no tenderness.  LUNGS: Normal breath sounds bilaterally, no wheezing, rales, rhonchi. No use of accessory muscles of respiration. Perm cath present CARDIOVASCULAR: S1, S2 normal. No murmurs, rubs, or gallops.  ABDOMEN: Soft, nontender, nondistended. Bowel sounds present. No organomegaly or mass. PD catheter present EXTREMITIES: No cyanosis, clubbing or edema b/l.    NEUROLOGIC: Cranial nerves II through XII are intact. No focal Motor or sensory deficits b/l.   PSYCHIATRIC:  patient is alert and oriented x 3.  SKIN: No obvious rash, lesion, or ulcer.   LABORATORY PANEL:  CBC Recent Labs  Lab 12/29/18 0310  WBC 9.5  HGB 9.8*  HCT 29.8*  PLT 200    Chemistries  Recent Labs  Lab 12/28/18 1013 12/29/18 0310  NA 131* 137  K 6.8* 4.4  CL 92* 96*  CO2 13* 23  GLUCOSE 304* 216*  BUN 97* 49*  CREATININE 9.92* 5.99*  CALCIUM 7.8* 7.5*  AST 27  --   ALT 22  --   ALKPHOS 481*  --   BILITOT 2.0*  --    Cardiac Enzymes No results for input(s): TROPONINI in the last 168 hours. RADIOLOGY:  Dg Chest Portable 1 View  Result Date: 12/28/2018 CLINICAL DATA:  Missed dialysis.  Nausea EXAM: PORTABLE CHEST 1 VIEW COMPARISON:  11/20/2018 FINDINGS: Right dialysis catheter remains in place, unchanged. Cardiomegaly, vascular congestion. Small to moderate right effusion. Bibasilar linear opacities, likely atelectasis. No acute bony abnormality. IMPRESSION: Small to moderate right effusion, similar prior study. Bibasilar atelectasis. Cardiomegaly, vascular congestion. Electronically Signed   By: Rolm Baptise M.D.   On: 12/28/2018 10:54   US Abdomen Limited Ruq  Result Date: 12/28/2018 CLINICAL DATA:  Elevated liver function tests. EXAM:  ULTRASOUND ABDOMEN LIMITED RIGHT UPPER QUADRANT COMPARISON:  CT scan of the abdomen dated 11/20/2018 FINDINGS: Gallbladder: Gallbladder wall is thickened to 6 mm. Dependent echogenic material consistent with stones with only minimal posterior  acoustical shadowing. This correlates with the multiple calcifications in the gallbladder on the prior CT scan. Negative sonographic Murphy's sign. Small polyps in the gallbladder. Common bile duct: Diameter: 5.1 mm, normal. Areas of abnormal echogenicity in the biliary tree consistent with tear. Liver: No focal lesion identified. Slight diffuse increased echogenicity of the liver parenchyma. Portal vein is patent on color Doppler imaging with normal direction of blood flow towards the liver. Right pleural effusion. Small amount of ascites. Small echogenic right kidney. IMPRESSION: 1. Cholelithiasis with a thickened gallbladder wall. 2. Persistent right effusion.  New ascites. 3. Probable air in the biliary tree of unknown etiology, new since the prior CT scan. 4. Slight increased echogenicity of the liver parenchyma which could represent hepatic steatosis. 5. Echogenic right kidney consistent with renal medical disease. Electronically Signed   By: Lorriane Shire M.D.   On: 12/28/2018 13:49   ASSESSMENT AND PLAN:  Theresa Rogers  is a 68 y.o. female coming in with nausea and vomiting.  She said she has been having nausea vomiting over the weekend.  Also having some diarrhea.  No blood in the vomit or diarrhea.  Her abdomen feels bloated  1.  Severe hyperkalemia with a potassium of 6.8.   -recieved Lokelma, calcium, bicarb and insulin.   Patient has hemodialysis catheter in the right chest. -K 4.4  2.  Acute on chronic systolic congestive heart failure.  Last ejection fraction in 2019 20 to 25%.  Patient on low-dose Coreg.  Spironolactone contraindicated in dialysis patient.  Hopefully can start ACE inhibitor but will see what nephrology states first.  Dialysis to manage fluid.  3.  Nausea vomiting and diarrhea with very poor PO intake for several days. -US abdomen/gallbladder shows cholelithiasis with thickened gallbladder. -Surgery Dr. Windell Moment to see patient  4.  Type 2 diabetes mellitus.  Put  on sliding scale insulin and low-dose Lantus.  5.  Essential hypertension on Coreg and hydralazine  6.  History of paroxysmal atrial fibrillation and coronary artery disease.  Continue aspirin and Coreg.  7.  End-stage renal disease.  Patient in training for peritoneal dialysis.  With her history of low ejection fraction and chronic systolic heart failure, they will need to remove excess fluid-- currently on hemodialysis in the hospital  Discussed with Dr. Zollie Scale  Case discussed with Care Management/Social Worker. Management plans discussed with the patient  CODE STATUS: full  DVT Prophylaxis: heparin  TOTAL TIME TAKING CARE OF THIS PATIENT: *30 minutes.  >50% time spent on counselling and coordination of care  POSSIBLE D/C IN *few* DAYS, DEPENDING ON CLINICAL CONDITION.  Note: This dictation was prepared with Dragon dictation along with smaller phrase technology. Any transcriptional errors that result from this process are unintentional.  Fritzi Mandes M.D on 12/29/2018 at 2:36 PM  Between 7am to 6pm - Pager - [phone]  After 6pm go to [email or web address] - password [email or web address] [facility]  Office  [phone]  CC: Primary care physician; Marygrace Drought, MDPatient ID: Theresa Rogers, female   DOB: October 06, 1950, 68 y.o.   MRN: [id number]
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
## 2018-12-29 NOTE — Progress Notes (Signed)
*  PRELIMINARY RESULTS* Echocardiogram 2D Echocardiogram has been performed.  Theresa Rogers 12/29/2018, 8:28 AM

## 2018-12-30 LAB — BILIRUBIN, FRACTIONATED(TOT/DIR/INDIR)
Bilirubin, Direct: 0.4 mg/dL — ABNORMAL HIGH (ref 0.0–0.2)
Indirect Bilirubin: 0.6 mg/dL (ref 0.3–0.9)
Total Bilirubin: 1 mg/dL (ref 0.3–1.2)

## 2018-12-30 LAB — HIV ANTIBODY (ROUTINE TESTING W REFLEX): HIV Screen 4th Generation wRfx: NONREACTIVE

## 2018-12-30 LAB — URINE CULTURE: Culture: >=100000 — AB

## 2018-12-30 LAB — PARATHYROID HORMONE, INTACT (NO CA): PTH: 180 pg/mL — ABNORMAL HIGH (ref 15–65)

## 2018-12-30 LAB — GLUCOSE, CAPILLARY: Glucose-Capillary: 72 mg/dL (ref 70–99)

## 2018-12-30 MED ORDER — CEPHALEXIN 500 MG PO CAPS: 500.0000 mg | ORAL_CAPSULE | ORAL | 0 refills | Status: AC

## 2018-12-30 MED ORDER — CEPHALEXIN 500 MG PO CAPS
500.0000 mg | ORAL_CAPSULE | ORAL | Status: DC
  Administered 2018-12-30: 14:00:00 500 mg via ORAL
  Filled 2018-12-30: qty 1

## 2018-12-30 MED ORDER — EPOETIN ALFA 10000 UNIT/ML IJ SOLN
4000.0000 [IU] | INTRAMUSCULAR | Status: DC
  Administered 2018-12-30: 4000 [IU] via INTRAVENOUS

## 2018-12-30 MED ORDER — INSULIN GLARGINE 100 UNIT/ML ~~LOC~~ SOLN
10.0000 [IU] | Freq: Every day | SUBCUTANEOUS | Status: DC
  Filled 2018-12-30: qty 0.1

## 2018-12-30 MED ORDER — DIPHENOXYLATE-ATROPINE 2.5-0.025 MG PO TABS: 2.0000 | ORAL_TABLET | Freq: Four times a day (QID) | ORAL | 0 refills | Status: AC | PRN

## 2018-12-30 NOTE — Discharge Instructions (Signed)
Patient advised to resume her peritoneal dialysis as before

## 2018-12-30 NOTE — Progress Notes (Signed)
Theresa Rogers to be D/C'd home per MD order.  Discussed prescriptions and follow up appointments with the patient. Prescriptions given to patient, medication list explained in detail. Pt verbalized understanding.  Allergies as of 12/30/2018      Reactions   No Known Allergies       Medication List    TAKE these medications   aspirin EC 81 MG tablet Take 81 mg by mouth daily.   atorvastatin 80 MG tablet Commonly known as: LIPITOR Take 80 mg by mouth every evening.   calcium acetate 667 MG capsule Commonly known as: PHOSLO Take 667 mg by mouth 2 (two) times daily with a meal.   carvedilol 6.25 MG tablet Commonly known as: COREG Take 1 tablet (6.25 mg total) by mouth 2 (two) times daily. Do not take the morning of dialysis.   cephALEXin 500 MG capsule Commonly known as: KEFLEX Take 1 capsule (500 mg total) by mouth daily.   diphenoxylate-atropine 2.5-0.025 MG tablet Commonly known as: LOMOTIL Take 2 tablets by mouth 4 (four) times daily as needed for diarrhea or loose stools.   hydrALAZINE 25 MG tablet Commonly known as: APRESOLINE Take 25 mg by mouth every 12 (twelve) hours.   HYDROcodone-acetaminophen 5-325 MG tablet Commonly known as: NORCO/VICODIN Take 1 tablet by mouth every 6 (six) hours as needed for moderate pain. What changed: when to take this   insulin glargine 100 UNIT/ML injection Commonly known as: LANTUS Inject 16 Units into the skin at bedtime.   insulin lispro 100 UNIT/ML injection Commonly known as: HUMALOG Inject 2-15 Units into the skin 3 (three) times daily before meals. Per sliding scale   multivitamin Tabs tablet Take 1 tablet by mouth daily.   ondansetron 8 MG tablet Commonly known as: ZOFRAN TK 1 T PO Q 12 H PRN       Vitals:   12/30/18 1200 12/30/18 1217  BP: 121/77 (!) 116/59  Pulse: 68 64  Resp: 17 18  Temp:  97.9 F (36.6 C)  SpO2: 98% 97%    Skin clean, dry and intact without evidence of skin break down, no evidence of  skin tears noted. IV catheter discontinued intact. Site without signs and symptoms of complications. Dressing and pressure applied. Pt denies pain at this time. No complaints noted.  An After Visit Summary was printed and given to the patient. Patient escorted via [place], and D/C home via private auto.  Chuck Hint RN [facility] [employer]

## 2018-12-30 NOTE — Progress Notes (Signed)
[facility]  ROUNDING NOTE   Subjective:  Patient seen and evaluated during hemodialysis. Tolerating well. No nausea or vomiting at the moment. States that diarrhea is improving.  Objective:  Vital signs in last 24 hours:  Temp:  [97.4 F (36.3 C)-97.7 F (36.5 C)] 97.5 F (36.4 C) (07/22 0916) Pulse Rate:  [54-68] 68 (07/22 1000) Resp:  [14-18] 18 (07/22 1000) BP: (93-127)/(60-74) 122/66 (07/22 1000) SpO2:  [93 %-95 %] 95 % (07/22 0916)  Weight change:  Filed Weights   12/28/18 1004  Weight: 64.4 kg    Intake/Output: I/O last 3 completed shifts: In: 240 [P.O.:240] Out: 0    Intake/Output this shift:  Total I/O In: 240 [P.O.:240] Out: -   Physical Exam: General: No acute distress  Head: Normocephalic, atraumatic. Moist oral mucosal membranes  Eyes: Anicteric  Neck: Supple, trachea midline  Lungs:  Clear to auscultation, normal effort  Heart: S1S2 no rubs  Abdomen:  Soft, nontender, bowel sounds present  Extremities: 1+ peripheral edema.  Neurologic: Awake, alert, following commands  Skin: No lesions  Access: Permacath in place, PD catheter in place    Basic Metabolic Panel: Recent Labs  Lab 12/28/18 1013 12/28/18 2233 12/29/18 0310  NA 131*  --  137  K 6.8*  --  4.4  CL 92*  --  96*  CO2 13*  --  23  GLUCOSE 304*  --  216*  BUN 97*  --  49*  CREATININE 9.92*  --  5.99*  CALCIUM 7.8*  --  7.5*  PHOS  --  6.7*  --     Liver Function Tests: Recent Labs  Lab 12/28/18 1013 12/30/18 0357  AST 27  --   ALT 22  --   ALKPHOS 481*  --   BILITOT 2.0* 1.0  PROT 6.1*  --   ALBUMIN 2.9*  --    Recent Labs  Lab 12/28/18 1013  LIPASE 19   No results for input(s): AMMONIA in the last 168 hours.  CBC: Recent Labs  Lab 12/28/18 1013 12/29/18 0310  WBC 11.2* 9.5  HGB 11.1* 9.8*  HCT 33.3* 29.8*  MCV 91.0 92.3  PLT 273 200    Cardiac Enzymes: No results for input(s): CKTOTAL, CKMB, CKMBINDEX, TROPONINI in the last 168  hours.  BNP: Invalid input(s): POCBNP  CBG: Recent Labs  Lab 12/29/18 0738 12/29/18 1132 12/29/18 1642 12/29/18 2109 12/30/18 0729  GLUCAP 187* 186* 248* 204* 72    Microbiology: Results for orders placed or performed during the hospital encounter of 12/28/18  Urine Culture     Status: Abnormal   Collection Time: 12/28/18 11:54 AM   Specimen: Urine, Random  Result Value Ref Range Status   Specimen Description   Final    URINE, RANDOM Performed at [facility], [address], [place], [place] [address]    Special Requests   Final    NONE Performed at [facility], [address] [place], [place] [address]    Culture >=100,000 COLONIES/mL KLEBSIELLA PNEUMONIAE (A)  Final   Report Status 12/30/2018 FINAL  Final   Organism ID, Bacteria KLEBSIELLA PNEUMONIAE (A)  Final      Susceptibility   Klebsiella pneumoniae - MIC*    AMPICILLIN >=32 RESISTANT Resistant     CEFAZOLIN <=4 SENSITIVE Sensitive     CEFTRIAXONE <=1 SENSITIVE Sensitive     CIPROFLOXACIN <=0.25 SENSITIVE Sensitive     GENTAMICIN <=1 SENSITIVE Sensitive     IMIPENEM <=0.25 SENSITIVE  Sensitive     NITROFURANTOIN 128 RESISTANT Resistant     TRIMETH/SULFA <=20 SENSITIVE Sensitive     AMPICILLIN/SULBACTAM 16 INTERMEDIATE Intermediate     PIP/TAZO <=4 SENSITIVE Sensitive     Extended ESBL NEGATIVE Sensitive     * >=100,000 COLONIES/mL KLEBSIELLA PNEUMONIAE  SARS Coronavirus 2 (CEPHEID - Performed in [place] hospital lab), Hosp Order     Status: None   Collection Time: 12/28/18 12:38 PM   Specimen: Nasopharyngeal Swab  Result Value Ref Range Status   SARS Coronavirus 2 NEGATIVE NEGATIVE Final    Comment: (NOTE) If result is NEGATIVE SARS-CoV-2 target nucleic acids are NOT DETECTED. The SARS-CoV-2 RNA is generally detectable in upper and lower  respiratory specimens during the acute phase of infection. The lowest  concentration of SARS-CoV-2 viral copies this assay can detect is 250   copies / mL. A negative result does not preclude SARS-CoV-2 infection  and should not be used as the sole basis for treatment or other  patient management decisions.  A negative result may occur with  improper specimen collection / handling, submission of specimen other  than nasopharyngeal swab, presence of viral mutation(s) within the  areas targeted by this assay, and inadequate number of viral copies  (<250 copies / mL). A negative result must be combined with clinical  observations, patient history, and epidemiological information. If result is POSITIVE SARS-CoV-2 target nucleic acids are DETECTED. The SARS-CoV-2 RNA is generally detectable in upper and lower  respiratory specimens dur ing the acute phase of infection.  Positive  results are indicative of active infection with SARS-CoV-2.  Clinical  correlation with patient history and other diagnostic information is  necessary to determine patient infection status.  Positive results do  not rule out bacterial infection or co-infection with other viruses. If result is PRESUMPTIVE POSTIVE SARS-CoV-2 nucleic acids MAY BE PRESENT.   A presumptive positive result was obtained on the submitted specimen  and confirmed on repeat testing.  While 2019 novel coronavirus  (SARS-CoV-2) nucleic acids may be present in the submitted sample  additional confirmatory testing may be necessary for epidemiological  and / or clinical management purposes  to differentiate between  SARS-CoV-2 and other Sarbecovirus currently known to infect humans.  If clinically indicated additional testing with an alternate test  methodology [phone]) is advised. The SARS-CoV-2 RNA is generally  detectable in upper and lower respiratory sp ecimens during the acute  phase of infection. The expected result is Negative. Fact Sheet for Patients:  StrictlyIdeas.no Fact Sheet for Healthcare  Providers: BankingDealers.co.za This test is not yet approved or cleared by the [place] FDA and has been authorized for detection and/or diagnosis of SARS-CoV-2 by FDA under an Emergency Use Authorization (EUA).  This EUA will remain in effect (meaning this test can be used) for the duration of the COVID-19 declaration under Section 564(b)(1) of the Act, 21 U.S.C. section 360bbb-3(b)(1), unless the authorization is terminated or revoked sooner. Performed at [facility], [address] [place], [place] [address]   MRSA PCR Screening     Status: None   Collection Time: 12/28/18  8:29 PM   Specimen: Nasal Mucosa; Nasopharyngeal  Result Value Ref Range Status   MRSA by PCR NEGATIVE NEGATIVE Final    Comment:        The GeneXpert MRSA Assay (FDA approved for NASAL specimens only), is one component of a comprehensive MRSA colonization surveillance program. It is not intended to diagnose MRSA infection nor to  guide or monitor treatment for MRSA infections. Performed at [facility], [address] [place], [place] [address]   Gastrointestinal Panel by PCR , Stool     Status: None   Collection Time: 12/28/18 11:21 PM   Specimen: Stool  Result Value Ref Range Status   Campylobacter species NOT DETECTED NOT DETECTED Final   Plesimonas shigelloides NOT DETECTED NOT DETECTED Final   Salmonella species NOT DETECTED NOT DETECTED Final   Yersinia enterocolitica NOT DETECTED NOT DETECTED Final   Vibrio species NOT DETECTED NOT DETECTED Final   Vibrio cholerae NOT DETECTED NOT DETECTED Final   Enteroaggregative E coli (EAEC) NOT DETECTED NOT DETECTED Final   Enteropathogenic E coli (EPEC) NOT DETECTED NOT DETECTED Final   Enterotoxigenic E coli (ETEC) NOT DETECTED NOT DETECTED Final   Shiga like toxin producing E coli (STEC) NOT DETECTED NOT DETECTED Final   Shigella/Enteroinvasive E coli (EIEC) NOT DETECTED NOT DETECTED Final    Cryptosporidium NOT DETECTED NOT DETECTED Final   Cyclospora cayetanensis NOT DETECTED NOT DETECTED Final   Entamoeba histolytica NOT DETECTED NOT DETECTED Final   Giardia lamblia NOT DETECTED NOT DETECTED Final   Adenovirus F40/41 NOT DETECTED NOT DETECTED Final   Astrovirus NOT DETECTED NOT DETECTED Final   Norovirus GI/GII NOT DETECTED NOT DETECTED Final   Rotavirus A NOT DETECTED NOT DETECTED Final   Sapovirus (I, II, IV, and V) NOT DETECTED NOT DETECTED Final    Comment: Performed at [facility], [address] [place], [place] [address]  C difficile quick scan w PCR reflex     Status: None   Collection Time: 12/28/18 11:22 PM   Specimen: Stool  Result Value Ref Range Status   C Diff antigen NEGATIVE NEGATIVE Final   C Diff toxin NEGATIVE NEGATIVE Final   C Diff interpretation No C. difficile detected.  Final    Comment: Performed at [facility], [address] [place], [place] [address]    Coagulation Studies: No results for input(s): LABPROT, INR in the last 72 hours.  Urinalysis: Recent Labs    12/28/18 1056  COLORURINE YELLOW  LABSPEC 1.025  PHURINE 5.5  GLUCOSEU 100*  HGBUR MODERATE*  BILIRUBINUR SMALL*  KETONESUR 15*  PROTEINUR LARGE*  NITRITE NEGATIVE  LEUKOCYTESUR SMALL*      Imaging: US Abdomen Limited Ruq  Result Date: 12/28/2018 CLINICAL DATA:  Elevated liver function tests. EXAM: ULTRASOUND ABDOMEN LIMITED RIGHT UPPER QUADRANT COMPARISON:  CT scan of the abdomen dated 11/20/2018 FINDINGS: Gallbladder: Gallbladder wall is thickened to 6 mm. Dependent echogenic material consistent with stones with only minimal posterior acoustical shadowing. This correlates with the multiple calcifications in the gallbladder on the prior CT scan. Negative sonographic Murphy's sign. Small polyps in the gallbladder. Common bile duct: Diameter: 5.1 mm, normal. Areas of abnormal echogenicity in the biliary tree consistent with tear. Liver: No focal lesion  identified. Slight diffuse increased echogenicity of the liver parenchyma. Portal vein is patent on color Doppler imaging with normal direction of blood flow towards the liver. Right pleural effusion. Small amount of ascites. Small echogenic right kidney. IMPRESSION: 1. Cholelithiasis with a thickened gallbladder wall. 2. Persistent right effusion.  New ascites. 3. Probable air in the biliary tree of unknown etiology, new since the prior CT scan. 4. Slight increased echogenicity of the liver parenchyma which could represent hepatic steatosis. 5. Echogenic right kidney consistent with renal medical disease. Electronically Signed   By: Lorriane Shire M.D.   On: 12/28/2018 13:49  Medications:   . cefTRIAXone (ROCEPHIN)  IV 1 g (12/29/18 7076)   . aspirin EC  81 mg Oral Daily  . atorvastatin  80 mg Oral QPM  . calcium acetate  667 mg Oral BID WC  . carvedilol  6.25 mg Oral BID  . Chlorhexidine Gluconate Cloth  6 each Topical Q0600  . heparin  5,000 Units Subcutaneous Q8H  . insulin aspart  0-5 Units Subcutaneous QHS  . insulin aspart  0-9 Units Subcutaneous TID WC  . insulin glargine  16 Units Subcutaneous QHS  . multivitamin  1 tablet Oral Daily   acetaminophen **OR** acetaminophen, diphenoxylate-atropine, HYDROcodone-acetaminophen, lidocaine-prilocaine, ondansetron **OR** ondansetron (ZOFRAN) IV, pentafluoroprop-tetrafluoroeth  Assessment/ Plan:  68 y.o. female with past medical history of coronary artery disease status post CABG and multiple cardiac catheterizations, chronic systolic heart failure, diabetes mellitus type 2, ESRD on HD, hyperlipidemia, hypertension, myocardial infarction, paroxysmal atrial fibrillation, anemia chronic kidney disease, secondary hyperparathyroidism who presents status post fall after recent PD catheter placement.  Theresa Rogers/MWF/IJ permcath/PD catheter/64.5  1.  ESRD on HD MWF.    Patient seen and evaluated during hemodialysis.  Tolerating well thus  far.  She will resume peritoneal dialysis training as an outpatient.  2.  Anemia of chronic kidney disease.    Start the patient on Epogen 4000 units IV with dialysis.  3.  Secondary hyperparathyroidism.    Awaiting repeat phosphorus today.  4.  Hypertension.    Continue current dosage of carvedilol.  LOS: 2 Dominiq Fontaine 7/22/202011:08 AM
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
## 2018-12-30 NOTE — Progress Notes (Signed)
Hemodialysis treatment completed at 1217. Net UF removed 1.5. Blood rinsed back, CVC flushed with NS and locked with heparin. Red caps replaced. Dressing remains intact and insertion site remains without signs and symptoms of infection.  Post hemodialysis report given to Stann Ore, RN.    12/30/18 1217  Hand-Off documentation  Report given to (Full Name) Chuck Hint, RN  Vital Signs  Temp 97.9 F (36.6 C)  Temp Source Oral  Pulse Rate 64  Resp 18  BP (!) 116/59  Oxygen Therapy  SpO2 97 %  O2 Device Room Air  During Hemodialysis Assessment  Dialysis Fluid Bolus Normal Saline  Bolus Amount (mL) 250 mL (rinseback)  Intra-Hemodialysis Comments Tx completed  Post-Hemodialysis Assessment  Rinseback Volume (mL) 250 mL  KECN 67.5 V  Dialyzer Clearance Lightly streaked  Duration of HD Treatment -hour(s) 3 hour(s)  Hemodialysis Intake (mL) 500 mL  UF Total -Machine (mL) 2000 mL  Net UF (mL) 1500 mL  Tolerated HD Treatment Yes  Education / Care Plan  Dialysis Education Provided Yes  Documented Education in Care Plan Yes  Outpatient Plan of Care Reviewed and on Chart Yes

## 2018-12-30 NOTE — Progress Notes (Signed)
[place] Hospital Day(s): 2.   Post op day(s):  Marland Kitchen   Interval History: Patient seen and examined, no acute events or new complaints overnight. Patient reports feeling better. Denies abdominal pain.  There is no pain radiation.  There is no alleviating or aggravating factor.  There is no nausea or vomiting.  She is tolerating diet.   Vital signs in last 24 hours: [min-max] current  Temp:  [97.4 F (36.3 C)-97.9 F (36.6 C)] 97.9 F (36.6 C) (07/22 1217) Pulse Rate:  [60-74] 64 (07/22 1217) Resp:  [11-18] 18 (07/22 1217) BP: (93-138)/(57-77) 116/59 (07/22 1217) SpO2:  [93 %-98 %] 97 % (07/22 1217)     Height: 5\' 4"  (162.6 cm) Weight: 64.4 kg BMI (Calculated): 24.36   Physical Exam:  Constitutional: alert, cooperative and no distress  Respiratory: breathing non-labored at rest  Cardiovascular: regular rate and sinus rhythm  Gastrointestinal: soft, non-tender, and non-distended  Labs:  CBC Latest Ref Rng & Units 12/29/2018 12/28/2018 11/20/2018  WBC 4.0 - 10.5 K/uL 9.5 11.2(H) 9.6  Hemoglobin 12.0 - 15.0 g/dL 9.8(L) 11.1(L) 12.1  Hematocrit 36.0 - 46.0 % 29.8(L) 33.3(L) 37.2  Platelets 150 - 400 K/uL 200 273 166   CMP Latest Ref Rng & Units 12/30/2018 12/29/2018 12/28/2018  Glucose 70 - 99 mg/dL - 216(H) 304(H)  BUN 8 - 23 mg/dL - 49(H) 97(H)  Creatinine 0.44 - 1.00 mg/dL - 5.99(H) 9.92(H)  Sodium 135 - 145 mmol/L - 137 131(L)  Potassium 3.5 - 5.1 mmol/L - 4.4 6.8(HH)  Chloride 98 - 111 mmol/L - 96(L) 92(L)  CO2 22 - 32 mmol/L - 23 13(L)  Calcium 8.9 - 10.3 mg/dL - 7.5(L) 7.8(L)  Total Protein 6.5 - 8.1 g/dL - - 6.1(L)  Total Bilirubin 0.3 - 1.2 mg/dL 1.0 - 2.0(H)  Alkaline Phos 38 - 126 U/L - - 481(H)  AST 15 - 41 U/L - - 27  ALT 0 - 44 U/L - - 22    Imaging studies: No new pertinent imaging studies   Assessment/Plan:  68 y.o. female with cholelithiasis, complicated by pertinent comorbidities including CHF of 25% ejection fraction, end-stage renal disease  on peritoneal dialysis (currently in hemodialysis), coronary artery disease status post multiple stents, hypertension, atrial fibrillation, diabetes. Patient is diarrhea, nausea and vomiting does not look related to her gallstone.  The bilirubin today is normal.  Most of the bilirubin is indirect.  I think that the gallbladder wall thickening is from her chronic CHF and her peritoneal dialysis ascites.  At this moment I do not recommend any surgical management or any further imaging.  Patient can be followed by primary care physician.  No indication for cholecystectomy.  Arnold Long, MD

## 2018-12-30 NOTE — Progress Notes (Signed)
Pre hemodialysis report received from L. Guy Sandifer, Therapist, sports. Received patient in RDU via bed. Awake, alert and verbally responsive. No acute distress noted. CVC without signs and symptoms of infection.  Dressing in place and uncompromised. Accessed CVC per policy and found patent on each side. Hemodialysis treatment initiated at 0916 via CVC. Plan for 3 hours treatment with UF of 1.5 as tolerated.    12/30/18 0916  Hand-Off documentation  Report received from (Full Name) Chuck Hint, RN  Vital Signs  Temp (!) 97.5 F (36.4 C)  Temp Source Oral  Pulse Rate 60  Pulse Rate Source Monitor  Resp 16  BP 98/60  BP Location Right Arm  BP Method Automatic  Patient Position (if appropriate) Lying  Oxygen Therapy  SpO2 95 %  O2 Device Room Air  Time-Out for Hemodialysis  What Procedure? dialysis  Pt Identifiers(min of two) First/Last Name;MRN/Account#  Correct Site? Yes  Correct Side? Yes  Correct Procedure? Yes  Consents Verified? Yes  Rad Studies Available? N/A  Safety Precautions Reviewed? Yes  Engineer, civil (consulting) Number 5  Station Number 4  UF/Alarm Test Passed  Conductivity: Meter 13.6  Conductivity: Machine  14  pH 7.4  Reverse Osmosis main  Normal Saline Lot Number [id number]  Dialyzer Lot Number [id number]  Disposable Set Lot Number [id number]  Machine Temperature 98.6 F (37 C)  Musician and Audible Yes  Blood Lines Intact and Secured Yes  Pre Treatment Patient Checks  Vascular access used during treatment Catheter  HD catheter dressing before treatment WDL  Hepatitis B Surface Antigen Results Negative  Date Hepatitis B Surface Antigen Drawn 12/23/18  Hepatitis B Surface Antibody  (immune)  Date Hepatitis B Surface Antibody Drawn 12/23/18  Hemodialysis Consent Verified Yes  Hemodialysis Standing Orders Initiated Yes  ECG (Telemetry) Monitor On Yes  Prime Ordered Normal Saline  Length of  DialysisTreatment -hour(s) 3 Hour(s)  Dialyzer Elisio 17H NR  Dialysate  2K;2.5 Ca  Dialysate Flow Ordered 800  Blood Flow Rate Ordered 400 mL/min  Ultrafiltration Goal 1500 Liters  During Hemodialysis Assessment  Blood Flow Rate (mL/min) 400 mL/min  Arterial Pressure (mmHg) -130 mmHg  Venous Pressure (mmHg) 100 mmHg  Transmembrane Pressure (mmHg) 40 mmHg  Ultrafiltration Rate (mL/min) 670 mL/min  Dialysate Flow Rate (mL/min) 800 ml/min  Conductivity: Machine  13.8  Dialysis Fluid Bolus Normal Saline  Bolus Amount (mL) 250 mL  Intra-Hemodialysis Comments Tx initiated

## 2018-12-30 NOTE — Progress Notes (Signed)
Pre hemodialysis assessment   12/30/18 0910  Neurological  Level of Consciousness Alert  Orientation Level Oriented X4;Oriented to person;Oriented to place;Oriented to time;Oriented to situation  Respiratory  Respiratory Pattern Regular;Unlabored  Cardiac  Pulse Regular  Jugular Venous Distention (JVD) No  ECG Monitor Yes  Cardiac Rhythm NSR  Vascular  R Radial Pulse +2  L Radial Pulse +2  Edema Right lower extremity;Left lower extremity  RLE Edema Non-pitting  LLE Edema +1  Integumentary  Integumentary (WDL) X  Skin Color Appropriate for ethnicity  Skin Condition Dry;Flaky  Skin Integrity Ecchymosis;MASD  Ecchymosis Location Arm;Leg  Ecchymosis Location Orientation Right;Left  Musculoskeletal  Musculoskeletal (WDL) X  Generalized Weakness Yes  Gastrointestinal  Bowel Sounds Assessment Active  GU Assessment  Genitourinary (WDL) X  Genitourinary Symptoms Oliguria  Peritoneal Catheter Mid lower abdomen Continuous ambulatory  Placement Date/Time: 11/19/18 1701   Procedural Verification: Medical records & consent reviewed  Time out: Correct Patient;Correct Site;Correct Procedure  Person Inserting Catheter: Dr. Leotis Pain  Catheter Location: Mid lower abdomen  Tube Size (Fr.)...  Site Assessment Clean;Dry  Drainage Description None  Catheter status Clamped;Capped  Dressing Gauze/Drain sponge  Dressing Status Clean;Dry;Intact  Psychosocial  Psychosocial (WDL) WDL

## 2019-01-01 DIAGNOSIS — Z992 Dependence on renal dialysis: Secondary | ICD-10-CM | POA: Diagnosis not present

## 2019-01-01 DIAGNOSIS — N186 End stage renal disease: Secondary | ICD-10-CM | POA: Diagnosis not present

## 2019-01-04 DIAGNOSIS — Z992 Dependence on renal dialysis: Secondary | ICD-10-CM | POA: Diagnosis not present

## 2019-01-04 DIAGNOSIS — N186 End stage renal disease: Secondary | ICD-10-CM | POA: Diagnosis not present

## 2019-01-05 DIAGNOSIS — E8770 Fluid overload, unspecified: Secondary | ICD-10-CM | POA: Diagnosis not present

## 2019-01-05 DIAGNOSIS — Z992 Dependence on renal dialysis: Secondary | ICD-10-CM | POA: Diagnosis not present

## 2019-01-05 DIAGNOSIS — N186 End stage renal disease: Secondary | ICD-10-CM | POA: Diagnosis not present

## 2019-01-05 DIAGNOSIS — D509 Iron deficiency anemia, unspecified: Secondary | ICD-10-CM | POA: Diagnosis not present

## 2019-01-06 DIAGNOSIS — Z992 Dependence on renal dialysis: Secondary | ICD-10-CM | POA: Diagnosis not present

## 2019-01-06 DIAGNOSIS — N186 End stage renal disease: Secondary | ICD-10-CM | POA: Diagnosis not present

## 2019-01-07 DIAGNOSIS — Z992 Dependence on renal dialysis: Secondary | ICD-10-CM | POA: Diagnosis not present

## 2019-01-07 DIAGNOSIS — N186 End stage renal disease: Secondary | ICD-10-CM | POA: Diagnosis not present

## 2019-01-08 DIAGNOSIS — N186 End stage renal disease: Secondary | ICD-10-CM | POA: Diagnosis not present

## 2019-01-08 DIAGNOSIS — Z992 Dependence on renal dialysis: Secondary | ICD-10-CM | POA: Diagnosis not present

## 2019-01-11 DIAGNOSIS — N186 End stage renal disease: Secondary | ICD-10-CM | POA: Diagnosis not present

## 2019-01-11 DIAGNOSIS — Z992 Dependence on renal dialysis: Secondary | ICD-10-CM | POA: Diagnosis not present

## 2019-01-12 DIAGNOSIS — N186 End stage renal disease: Secondary | ICD-10-CM | POA: Diagnosis not present

## 2019-01-12 DIAGNOSIS — Z992 Dependence on renal dialysis: Secondary | ICD-10-CM | POA: Diagnosis not present

## 2019-01-13 DIAGNOSIS — E8779 Other fluid overload: Secondary | ICD-10-CM | POA: Diagnosis not present

## 2019-01-13 DIAGNOSIS — Z992 Dependence on renal dialysis: Secondary | ICD-10-CM | POA: Diagnosis not present

## 2019-01-13 DIAGNOSIS — N186 End stage renal disease: Secondary | ICD-10-CM | POA: Diagnosis not present

## 2019-01-14 DIAGNOSIS — N186 End stage renal disease: Secondary | ICD-10-CM | POA: Diagnosis not present

## 2019-01-14 DIAGNOSIS — Z992 Dependence on renal dialysis: Secondary | ICD-10-CM | POA: Diagnosis not present

## 2019-01-14 DIAGNOSIS — D509 Iron deficiency anemia, unspecified: Secondary | ICD-10-CM | POA: Diagnosis not present

## 2019-01-15 DIAGNOSIS — D509 Iron deficiency anemia, unspecified: Secondary | ICD-10-CM | POA: Diagnosis not present

## 2019-01-15 DIAGNOSIS — Z992 Dependence on renal dialysis: Secondary | ICD-10-CM | POA: Diagnosis not present

## 2019-01-15 DIAGNOSIS — N186 End stage renal disease: Secondary | ICD-10-CM | POA: Diagnosis not present

## 2019-01-16 DIAGNOSIS — N186 End stage renal disease: Secondary | ICD-10-CM | POA: Diagnosis not present

## 2019-01-16 DIAGNOSIS — Z992 Dependence on renal dialysis: Secondary | ICD-10-CM | POA: Diagnosis not present

## 2019-01-16 DIAGNOSIS — D509 Iron deficiency anemia, unspecified: Secondary | ICD-10-CM | POA: Diagnosis not present

## 2019-01-17 DIAGNOSIS — Z992 Dependence on renal dialysis: Secondary | ICD-10-CM | POA: Diagnosis not present

## 2019-01-17 DIAGNOSIS — D509 Iron deficiency anemia, unspecified: Secondary | ICD-10-CM | POA: Diagnosis not present

## 2019-01-17 DIAGNOSIS — N186 End stage renal disease: Secondary | ICD-10-CM | POA: Diagnosis not present

## 2019-01-18 DIAGNOSIS — D509 Iron deficiency anemia, unspecified: Secondary | ICD-10-CM | POA: Diagnosis not present

## 2019-01-18 DIAGNOSIS — Z992 Dependence on renal dialysis: Secondary | ICD-10-CM | POA: Diagnosis not present

## 2019-01-18 DIAGNOSIS — N186 End stage renal disease: Secondary | ICD-10-CM | POA: Diagnosis not present

## 2019-01-19 DIAGNOSIS — N186 End stage renal disease: Secondary | ICD-10-CM | POA: Diagnosis not present

## 2019-01-19 DIAGNOSIS — Z992 Dependence on renal dialysis: Secondary | ICD-10-CM | POA: Diagnosis not present

## 2019-01-19 DIAGNOSIS — D509 Iron deficiency anemia, unspecified: Secondary | ICD-10-CM | POA: Diagnosis not present

## 2019-01-20 DIAGNOSIS — D509 Iron deficiency anemia, unspecified: Secondary | ICD-10-CM | POA: Diagnosis not present

## 2019-01-20 DIAGNOSIS — Z992 Dependence on renal dialysis: Secondary | ICD-10-CM | POA: Diagnosis not present

## 2019-01-20 DIAGNOSIS — N186 End stage renal disease: Secondary | ICD-10-CM | POA: Diagnosis not present

## 2019-01-21 DIAGNOSIS — Z992 Dependence on renal dialysis: Secondary | ICD-10-CM | POA: Diagnosis not present

## 2019-01-21 DIAGNOSIS — N186 End stage renal disease: Secondary | ICD-10-CM | POA: Diagnosis not present

## 2019-01-21 DIAGNOSIS — D509 Iron deficiency anemia, unspecified: Secondary | ICD-10-CM | POA: Diagnosis not present

## 2019-01-22 DIAGNOSIS — N186 End stage renal disease: Secondary | ICD-10-CM | POA: Diagnosis not present

## 2019-01-22 DIAGNOSIS — D509 Iron deficiency anemia, unspecified: Secondary | ICD-10-CM | POA: Diagnosis not present

## 2019-01-22 DIAGNOSIS — Z992 Dependence on renal dialysis: Secondary | ICD-10-CM | POA: Diagnosis not present

## 2019-01-23 DIAGNOSIS — D509 Iron deficiency anemia, unspecified: Secondary | ICD-10-CM | POA: Diagnosis not present

## 2019-01-23 DIAGNOSIS — N186 End stage renal disease: Secondary | ICD-10-CM | POA: Diagnosis not present

## 2019-01-23 DIAGNOSIS — Z992 Dependence on renal dialysis: Secondary | ICD-10-CM | POA: Diagnosis not present

## 2019-01-24 DIAGNOSIS — N186 End stage renal disease: Secondary | ICD-10-CM | POA: Diagnosis not present

## 2019-01-24 DIAGNOSIS — D509 Iron deficiency anemia, unspecified: Secondary | ICD-10-CM | POA: Diagnosis not present

## 2019-01-24 DIAGNOSIS — Z992 Dependence on renal dialysis: Secondary | ICD-10-CM | POA: Diagnosis not present

## 2019-01-25 DIAGNOSIS — D509 Iron deficiency anemia, unspecified: Secondary | ICD-10-CM | POA: Diagnosis not present

## 2019-01-25 DIAGNOSIS — Z992 Dependence on renal dialysis: Secondary | ICD-10-CM | POA: Diagnosis not present

## 2019-01-25 DIAGNOSIS — N186 End stage renal disease: Secondary | ICD-10-CM | POA: Diagnosis not present

## 2019-01-26 DIAGNOSIS — D509 Iron deficiency anemia, unspecified: Secondary | ICD-10-CM | POA: Diagnosis not present

## 2019-01-26 DIAGNOSIS — Z992 Dependence on renal dialysis: Secondary | ICD-10-CM | POA: Diagnosis not present

## 2019-01-26 DIAGNOSIS — N186 End stage renal disease: Secondary | ICD-10-CM | POA: Diagnosis not present

## 2019-01-27 DIAGNOSIS — N186 End stage renal disease: Secondary | ICD-10-CM | POA: Diagnosis not present

## 2019-01-27 DIAGNOSIS — D509 Iron deficiency anemia, unspecified: Secondary | ICD-10-CM | POA: Diagnosis not present

## 2019-01-27 DIAGNOSIS — Z992 Dependence on renal dialysis: Secondary | ICD-10-CM | POA: Diagnosis not present

## 2019-01-28 DIAGNOSIS — D509 Iron deficiency anemia, unspecified: Secondary | ICD-10-CM | POA: Diagnosis not present

## 2019-01-28 DIAGNOSIS — Z992 Dependence on renal dialysis: Secondary | ICD-10-CM | POA: Diagnosis not present

## 2019-01-28 DIAGNOSIS — N186 End stage renal disease: Secondary | ICD-10-CM | POA: Diagnosis not present

## 2019-01-29 ENCOUNTER — Telehealth (INDEPENDENT_AMBULATORY_CARE_PROVIDER_SITE_OTHER): Payer: Self-pay

## 2019-01-29 DIAGNOSIS — Z992 Dependence on renal dialysis: Secondary | ICD-10-CM | POA: Diagnosis not present

## 2019-01-29 DIAGNOSIS — D509 Iron deficiency anemia, unspecified: Secondary | ICD-10-CM | POA: Diagnosis not present

## 2019-01-29 DIAGNOSIS — N186 End stage renal disease: Secondary | ICD-10-CM | POA: Diagnosis not present

## 2019-01-29 NOTE — Telephone Encounter (Signed)
A fax was received from Shanon Payor for this patient to have a permcath removal. Patient is scheduled with Dr. Lucky Cowboy for procedure on 02/04/2019 with a 10:00 am arrival time. Patient will do her Covid testing on 02/02/2019 before 11:00 am.

## 2019-01-30 DIAGNOSIS — D509 Iron deficiency anemia, unspecified: Secondary | ICD-10-CM | POA: Diagnosis not present

## 2019-01-30 DIAGNOSIS — N186 End stage renal disease: Secondary | ICD-10-CM | POA: Diagnosis not present

## 2019-01-30 DIAGNOSIS — Z992 Dependence on renal dialysis: Secondary | ICD-10-CM | POA: Diagnosis not present

## 2019-01-31 DIAGNOSIS — N186 End stage renal disease: Secondary | ICD-10-CM | POA: Diagnosis not present

## 2019-01-31 DIAGNOSIS — Z992 Dependence on renal dialysis: Secondary | ICD-10-CM | POA: Diagnosis not present

## 2019-01-31 DIAGNOSIS — D509 Iron deficiency anemia, unspecified: Secondary | ICD-10-CM | POA: Diagnosis not present

## 2019-02-01 DIAGNOSIS — Z992 Dependence on renal dialysis: Secondary | ICD-10-CM | POA: Diagnosis not present

## 2019-02-01 DIAGNOSIS — N186 End stage renal disease: Secondary | ICD-10-CM | POA: Diagnosis not present

## 2019-02-01 DIAGNOSIS — D509 Iron deficiency anemia, unspecified: Secondary | ICD-10-CM | POA: Diagnosis not present

## 2019-02-02 ENCOUNTER — Encounter (INDEPENDENT_AMBULATORY_CARE_PROVIDER_SITE_OTHER): Payer: Self-pay

## 2019-02-02 ENCOUNTER — Other Ambulatory Visit: Admission: RE | Admit: 2019-02-02 | Payer: Medicare Other | Source: Ambulatory Visit

## 2019-02-02 DIAGNOSIS — Z992 Dependence on renal dialysis: Secondary | ICD-10-CM | POA: Diagnosis not present

## 2019-02-02 DIAGNOSIS — D509 Iron deficiency anemia, unspecified: Secondary | ICD-10-CM | POA: Diagnosis not present

## 2019-02-02 DIAGNOSIS — N186 End stage renal disease: Secondary | ICD-10-CM | POA: Diagnosis not present

## 2019-02-02 NOTE — Telephone Encounter (Addendum)
Patient called today to reschedule her angio procedure because she is doing a self-quarantine for 14 days. Patient has been rescheduled to 02/18/2019 with Dr. Lucky Cowboy and a 10:45 am arrival time to the MM. Patient will do her Covid testing on 03/02/2019 before 11:00 am. This will be mailed to the patient.

## 2019-02-03 ENCOUNTER — Other Ambulatory Visit (INDEPENDENT_AMBULATORY_CARE_PROVIDER_SITE_OTHER): Payer: Self-pay | Admitting: Nurse Practitioner

## 2019-02-03 DIAGNOSIS — Z992 Dependence on renal dialysis: Secondary | ICD-10-CM | POA: Diagnosis not present

## 2019-02-03 DIAGNOSIS — N186 End stage renal disease: Secondary | ICD-10-CM | POA: Diagnosis not present

## 2019-02-03 DIAGNOSIS — D509 Iron deficiency anemia, unspecified: Secondary | ICD-10-CM | POA: Diagnosis not present

## 2019-02-04 DIAGNOSIS — N186 End stage renal disease: Secondary | ICD-10-CM | POA: Diagnosis not present

## 2019-02-04 DIAGNOSIS — Z992 Dependence on renal dialysis: Secondary | ICD-10-CM | POA: Diagnosis not present

## 2019-02-04 DIAGNOSIS — D509 Iron deficiency anemia, unspecified: Secondary | ICD-10-CM | POA: Diagnosis not present

## 2019-02-05 DIAGNOSIS — N186 End stage renal disease: Secondary | ICD-10-CM | POA: Diagnosis not present

## 2019-02-05 DIAGNOSIS — Z992 Dependence on renal dialysis: Secondary | ICD-10-CM | POA: Diagnosis not present

## 2019-02-05 DIAGNOSIS — D509 Iron deficiency anemia, unspecified: Secondary | ICD-10-CM | POA: Diagnosis not present

## 2019-02-06 DIAGNOSIS — Z992 Dependence on renal dialysis: Secondary | ICD-10-CM | POA: Diagnosis not present

## 2019-02-06 DIAGNOSIS — N186 End stage renal disease: Secondary | ICD-10-CM | POA: Diagnosis not present

## 2019-02-06 DIAGNOSIS — D509 Iron deficiency anemia, unspecified: Secondary | ICD-10-CM | POA: Diagnosis not present

## 2019-02-07 DIAGNOSIS — D509 Iron deficiency anemia, unspecified: Secondary | ICD-10-CM | POA: Diagnosis not present

## 2019-02-07 DIAGNOSIS — N186 End stage renal disease: Secondary | ICD-10-CM | POA: Diagnosis not present

## 2019-02-07 DIAGNOSIS — Z992 Dependence on renal dialysis: Secondary | ICD-10-CM | POA: Diagnosis not present

## 2019-02-08 DIAGNOSIS — D509 Iron deficiency anemia, unspecified: Secondary | ICD-10-CM | POA: Diagnosis not present

## 2019-02-08 DIAGNOSIS — Z992 Dependence on renal dialysis: Secondary | ICD-10-CM | POA: Diagnosis not present

## 2019-02-08 DIAGNOSIS — N186 End stage renal disease: Secondary | ICD-10-CM | POA: Diagnosis not present

## 2019-02-09 DIAGNOSIS — Z992 Dependence on renal dialysis: Secondary | ICD-10-CM | POA: Diagnosis not present

## 2019-02-09 DIAGNOSIS — N186 End stage renal disease: Secondary | ICD-10-CM | POA: Diagnosis not present

## 2019-02-09 DIAGNOSIS — Z794 Long term (current) use of insulin: Secondary | ICD-10-CM | POA: Diagnosis not present

## 2019-02-09 DIAGNOSIS — I120 Hypertensive chronic kidney disease with stage 5 chronic kidney disease or end stage renal disease: Secondary | ICD-10-CM | POA: Diagnosis not present

## 2019-02-09 DIAGNOSIS — D509 Iron deficiency anemia, unspecified: Secondary | ICD-10-CM | POA: Diagnosis not present

## 2019-02-09 DIAGNOSIS — E1122 Type 2 diabetes mellitus with diabetic chronic kidney disease: Secondary | ICD-10-CM | POA: Diagnosis not present

## 2019-02-09 DIAGNOSIS — Z23 Encounter for immunization: Secondary | ICD-10-CM | POA: Diagnosis not present

## 2019-02-10 DIAGNOSIS — N186 End stage renal disease: Secondary | ICD-10-CM | POA: Diagnosis not present

## 2019-02-10 DIAGNOSIS — D509 Iron deficiency anemia, unspecified: Secondary | ICD-10-CM | POA: Diagnosis not present

## 2019-02-10 DIAGNOSIS — Z23 Encounter for immunization: Secondary | ICD-10-CM | POA: Diagnosis not present

## 2019-02-10 DIAGNOSIS — Z992 Dependence on renal dialysis: Secondary | ICD-10-CM | POA: Diagnosis not present

## 2019-02-11 DIAGNOSIS — Z23 Encounter for immunization: Secondary | ICD-10-CM | POA: Diagnosis not present

## 2019-02-11 DIAGNOSIS — D509 Iron deficiency anemia, unspecified: Secondary | ICD-10-CM | POA: Diagnosis not present

## 2019-02-11 DIAGNOSIS — N186 End stage renal disease: Secondary | ICD-10-CM | POA: Diagnosis not present

## 2019-02-11 DIAGNOSIS — Z992 Dependence on renal dialysis: Secondary | ICD-10-CM | POA: Diagnosis not present

## 2019-02-12 DIAGNOSIS — Z992 Dependence on renal dialysis: Secondary | ICD-10-CM | POA: Diagnosis not present

## 2019-02-12 DIAGNOSIS — D509 Iron deficiency anemia, unspecified: Secondary | ICD-10-CM | POA: Diagnosis not present

## 2019-02-12 DIAGNOSIS — Z23 Encounter for immunization: Secondary | ICD-10-CM | POA: Diagnosis not present

## 2019-02-12 DIAGNOSIS — N186 End stage renal disease: Secondary | ICD-10-CM | POA: Diagnosis not present

## 2019-02-13 DIAGNOSIS — Z23 Encounter for immunization: Secondary | ICD-10-CM | POA: Diagnosis not present

## 2019-02-13 DIAGNOSIS — Z992 Dependence on renal dialysis: Secondary | ICD-10-CM | POA: Diagnosis not present

## 2019-02-13 DIAGNOSIS — D509 Iron deficiency anemia, unspecified: Secondary | ICD-10-CM | POA: Diagnosis not present

## 2019-02-13 DIAGNOSIS — N186 End stage renal disease: Secondary | ICD-10-CM | POA: Diagnosis not present

## 2019-02-14 DIAGNOSIS — N186 End stage renal disease: Secondary | ICD-10-CM | POA: Diagnosis not present

## 2019-02-14 DIAGNOSIS — Z992 Dependence on renal dialysis: Secondary | ICD-10-CM | POA: Diagnosis not present

## 2019-02-14 DIAGNOSIS — Z23 Encounter for immunization: Secondary | ICD-10-CM | POA: Diagnosis not present

## 2019-02-14 DIAGNOSIS — D509 Iron deficiency anemia, unspecified: Secondary | ICD-10-CM | POA: Diagnosis not present

## 2019-02-15 ENCOUNTER — Inpatient Hospital Stay
Admission: EM | Admit: 2019-02-15 | Discharge: 2019-03-11 | DRG: 371 | Disposition: E | Payer: Medicare Other | Attending: Internal Medicine | Admitting: Internal Medicine

## 2019-02-15 ENCOUNTER — Other Ambulatory Visit: Payer: Self-pay

## 2019-02-15 ENCOUNTER — Inpatient Hospital Stay: Payer: Medicare Other

## 2019-02-15 ENCOUNTER — Encounter: Payer: Self-pay | Admitting: Emergency Medicine

## 2019-02-15 DIAGNOSIS — Y838 Other surgical procedures as the cause of abnormal reaction of the patient, or of later complication, without mention of misadventure at the time of the procedure: Secondary | ICD-10-CM | POA: Diagnosis present

## 2019-02-15 DIAGNOSIS — I420 Dilated cardiomyopathy: Secondary | ICD-10-CM | POA: Diagnosis not present

## 2019-02-15 DIAGNOSIS — Z66 Do not resuscitate: Secondary | ICD-10-CM | POA: Diagnosis not present

## 2019-02-15 DIAGNOSIS — I132 Hypertensive heart and chronic kidney disease with heart failure and with stage 5 chronic kidney disease, or end stage renal disease: Secondary | ICD-10-CM | POA: Diagnosis present

## 2019-02-15 DIAGNOSIS — Z8249 Family history of ischemic heart disease and other diseases of the circulatory system: Secondary | ICD-10-CM

## 2019-02-15 DIAGNOSIS — T82898A Other specified complication of vascular prosthetic devices, implants and grafts, initial encounter: Secondary | ICD-10-CM | POA: Diagnosis not present

## 2019-02-15 DIAGNOSIS — Z992 Dependence on renal dialysis: Secondary | ICD-10-CM

## 2019-02-15 DIAGNOSIS — R571 Hypovolemic shock: Secondary | ICD-10-CM | POA: Diagnosis not present

## 2019-02-15 DIAGNOSIS — L97319 Non-pressure chronic ulcer of right ankle with unspecified severity: Secondary | ICD-10-CM | POA: Diagnosis present

## 2019-02-15 DIAGNOSIS — R0603 Acute respiratory distress: Secondary | ICD-10-CM | POA: Diagnosis not present

## 2019-02-15 DIAGNOSIS — Z515 Encounter for palliative care: Secondary | ICD-10-CM | POA: Diagnosis not present

## 2019-02-15 DIAGNOSIS — L97829 Non-pressure chronic ulcer of other part of left lower leg with unspecified severity: Secondary | ICD-10-CM | POA: Diagnosis not present

## 2019-02-15 DIAGNOSIS — I480 Paroxysmal atrial fibrillation: Secondary | ICD-10-CM | POA: Diagnosis present

## 2019-02-15 DIAGNOSIS — A0472 Enterocolitis due to Clostridium difficile, not specified as recurrent: Secondary | ICD-10-CM | POA: Diagnosis not present

## 2019-02-15 DIAGNOSIS — E11622 Type 2 diabetes mellitus with other skin ulcer: Secondary | ICD-10-CM | POA: Diagnosis not present

## 2019-02-15 DIAGNOSIS — I445 Left posterior fascicular block: Secondary | ICD-10-CM | POA: Diagnosis not present

## 2019-02-15 DIAGNOSIS — I959 Hypotension, unspecified: Secondary | ICD-10-CM | POA: Diagnosis not present

## 2019-02-15 DIAGNOSIS — E876 Hypokalemia: Secondary | ICD-10-CM | POA: Diagnosis present

## 2019-02-15 DIAGNOSIS — E1122 Type 2 diabetes mellitus with diabetic chronic kidney disease: Secondary | ICD-10-CM | POA: Diagnosis present

## 2019-02-15 DIAGNOSIS — R059 Cough, unspecified: Secondary | ICD-10-CM

## 2019-02-15 DIAGNOSIS — I252 Old myocardial infarction: Secondary | ICD-10-CM | POA: Diagnosis not present

## 2019-02-15 DIAGNOSIS — N186 End stage renal disease: Secondary | ICD-10-CM | POA: Diagnosis not present

## 2019-02-15 DIAGNOSIS — N2581 Secondary hyperparathyroidism of renal origin: Secondary | ICD-10-CM | POA: Diagnosis present

## 2019-02-15 DIAGNOSIS — R570 Cardiogenic shock: Secondary | ICD-10-CM | POA: Diagnosis not present

## 2019-02-15 DIAGNOSIS — J90 Pleural effusion, not elsewhere classified: Secondary | ICD-10-CM | POA: Diagnosis not present

## 2019-02-15 DIAGNOSIS — I120 Hypertensive chronic kidney disease with stage 5 chronic kidney disease or end stage renal disease: Secondary | ICD-10-CM | POA: Diagnosis not present

## 2019-02-15 DIAGNOSIS — M79606 Pain in leg, unspecified: Secondary | ICD-10-CM | POA: Diagnosis not present

## 2019-02-15 DIAGNOSIS — I5023 Acute on chronic systolic (congestive) heart failure: Secondary | ICD-10-CM | POA: Diagnosis present

## 2019-02-15 DIAGNOSIS — E1143 Type 2 diabetes mellitus with diabetic autonomic (poly)neuropathy: Secondary | ICD-10-CM | POA: Diagnosis present

## 2019-02-15 DIAGNOSIS — I25118 Atherosclerotic heart disease of native coronary artery with other forms of angina pectoris: Secondary | ICD-10-CM | POA: Diagnosis not present

## 2019-02-15 DIAGNOSIS — I255 Ischemic cardiomyopathy: Secondary | ICD-10-CM | POA: Diagnosis present

## 2019-02-15 DIAGNOSIS — I340 Nonrheumatic mitral (valve) insufficiency: Secondary | ICD-10-CM | POA: Diagnosis not present

## 2019-02-15 DIAGNOSIS — N179 Acute kidney failure, unspecified: Secondary | ICD-10-CM | POA: Diagnosis not present

## 2019-02-15 DIAGNOSIS — Z8042 Family history of malignant neoplasm of prostate: Secondary | ICD-10-CM

## 2019-02-15 DIAGNOSIS — J9601 Acute respiratory failure with hypoxia: Secondary | ICD-10-CM | POA: Diagnosis not present

## 2019-02-15 DIAGNOSIS — E875 Hyperkalemia: Secondary | ICD-10-CM | POA: Diagnosis present

## 2019-02-15 DIAGNOSIS — R001 Bradycardia, unspecified: Secondary | ICD-10-CM | POA: Diagnosis not present

## 2019-02-15 DIAGNOSIS — S32049A Unspecified fracture of fourth lumbar vertebra, initial encounter for closed fracture: Secondary | ICD-10-CM | POA: Diagnosis present

## 2019-02-15 DIAGNOSIS — E860 Dehydration: Secondary | ICD-10-CM | POA: Diagnosis not present

## 2019-02-15 DIAGNOSIS — L97929 Non-pressure chronic ulcer of unspecified part of left lower leg with unspecified severity: Secondary | ICD-10-CM | POA: Diagnosis not present

## 2019-02-15 DIAGNOSIS — R0602 Shortness of breath: Secondary | ICD-10-CM | POA: Diagnosis not present

## 2019-02-15 DIAGNOSIS — R252 Cramp and spasm: Secondary | ICD-10-CM

## 2019-02-15 DIAGNOSIS — D631 Anemia in chronic kidney disease: Secondary | ICD-10-CM | POA: Diagnosis not present

## 2019-02-15 DIAGNOSIS — R197 Diarrhea, unspecified: Secondary | ICD-10-CM | POA: Diagnosis not present

## 2019-02-15 DIAGNOSIS — E785 Hyperlipidemia, unspecified: Secondary | ICD-10-CM | POA: Diagnosis present

## 2019-02-15 DIAGNOSIS — I2510 Atherosclerotic heart disease of native coronary artery without angina pectoris: Secondary | ICD-10-CM | POA: Diagnosis present

## 2019-02-15 DIAGNOSIS — Z833 Family history of diabetes mellitus: Secondary | ICD-10-CM

## 2019-02-15 DIAGNOSIS — T829XXA Unspecified complication of cardiac and vascular prosthetic device, implant and graft, initial encounter: Secondary | ICD-10-CM | POA: Diagnosis present

## 2019-02-15 DIAGNOSIS — R531 Weakness: Secondary | ICD-10-CM | POA: Diagnosis not present

## 2019-02-15 DIAGNOSIS — L98499 Non-pressure chronic ulcer of skin of other sites with unspecified severity: Secondary | ICD-10-CM | POA: Diagnosis not present

## 2019-02-15 DIAGNOSIS — I361 Nonrheumatic tricuspid (valve) insufficiency: Secondary | ICD-10-CM | POA: Diagnosis not present

## 2019-02-15 DIAGNOSIS — Z20828 Contact with and (suspected) exposure to other viral communicable diseases: Secondary | ICD-10-CM | POA: Diagnosis present

## 2019-02-15 DIAGNOSIS — L97919 Non-pressure chronic ulcer of unspecified part of right lower leg with unspecified severity: Secondary | ICD-10-CM | POA: Diagnosis not present

## 2019-02-15 DIAGNOSIS — R05 Cough: Secondary | ICD-10-CM | POA: Diagnosis not present

## 2019-02-15 DIAGNOSIS — K529 Noninfective gastroenteritis and colitis, unspecified: Secondary | ICD-10-CM | POA: Diagnosis not present

## 2019-02-15 DIAGNOSIS — L97419 Non-pressure chronic ulcer of right heel and midfoot with unspecified severity: Secondary | ICD-10-CM | POA: Diagnosis not present

## 2019-02-15 DIAGNOSIS — J9690 Respiratory failure, unspecified, unspecified whether with hypoxia or hypercapnia: Secondary | ICD-10-CM

## 2019-02-15 DIAGNOSIS — R0902 Hypoxemia: Secondary | ICD-10-CM

## 2019-02-15 DIAGNOSIS — I5022 Chronic systolic (congestive) heart failure: Secondary | ICD-10-CM | POA: Diagnosis not present

## 2019-02-15 DIAGNOSIS — Z794 Long term (current) use of insulin: Secondary | ICD-10-CM

## 2019-02-15 DIAGNOSIS — Z951 Presence of aortocoronary bypass graft: Secondary | ICD-10-CM | POA: Diagnosis not present

## 2019-02-15 DIAGNOSIS — R911 Solitary pulmonary nodule: Secondary | ICD-10-CM | POA: Diagnosis present

## 2019-02-15 DIAGNOSIS — Z7982 Long term (current) use of aspirin: Secondary | ICD-10-CM

## 2019-02-15 DIAGNOSIS — R946 Abnormal results of thyroid function studies: Secondary | ICD-10-CM | POA: Diagnosis present

## 2019-02-15 DIAGNOSIS — R112 Nausea with vomiting, unspecified: Secondary | ICD-10-CM | POA: Diagnosis not present

## 2019-02-15 DIAGNOSIS — Z79899 Other long term (current) drug therapy: Secondary | ICD-10-CM

## 2019-02-15 DIAGNOSIS — R0600 Dyspnea, unspecified: Secondary | ICD-10-CM

## 2019-02-15 DIAGNOSIS — K8020 Calculus of gallbladder without cholecystitis without obstruction: Secondary | ICD-10-CM | POA: Diagnosis not present

## 2019-02-15 DIAGNOSIS — Z955 Presence of coronary angioplasty implant and graft: Secondary | ICD-10-CM

## 2019-02-15 LAB — CBC WITH DIFFERENTIAL/PLATELET
Abs Immature Granulocytes: 0.07 10*3/uL (ref 0.00–0.07)
Basophils Absolute: 0 10*3/uL (ref 0.0–0.1)
Basophils Relative: 0 %
Eosinophils Absolute: 0.3 10*3/uL (ref 0.0–0.5)
Eosinophils Relative: 2 %
HCT: 35.6 % — ABNORMAL LOW (ref 36.0–46.0)
Hemoglobin: 12 g/dL (ref 12.0–15.0)
Immature Granulocytes: 1 %
Lymphocytes Relative: 4 %
Lymphs Abs: 0.5 10*3/uL — ABNORMAL LOW (ref 0.7–4.0)
MCH: 31 pg (ref 26.0–34.0)
MCHC: 33.7 g/dL (ref 30.0–36.0)
MCV: 92 fL (ref 80.0–100.0)
Monocytes Absolute: 1.1 10*3/uL — ABNORMAL HIGH (ref 0.1–1.0)
Monocytes Relative: 9 %
Neutro Abs: 9.4 10*3/uL — ABNORMAL HIGH (ref 1.7–7.7)
Neutrophils Relative %: 84 %
Platelets: 233 10*3/uL (ref 150–400)
RBC: 3.87 MIL/uL (ref 3.87–5.11)
RDW: 15.7 % — ABNORMAL HIGH (ref 11.5–15.5)
WBC: 11.3 10*3/uL — ABNORMAL HIGH (ref 4.0–10.5)
nRBC: 0 % (ref 0.0–0.2)

## 2019-02-15 LAB — COMPREHENSIVE METABOLIC PANEL
ALT: 22 U/L (ref 0–44)
AST: 27 U/L (ref 15–41)
Albumin: 2.3 g/dL — ABNORMAL LOW (ref 3.5–5.0)
Alkaline Phosphatase: 342 U/L — ABNORMAL HIGH (ref 38–126)
Anion gap: 17 — ABNORMAL HIGH (ref 5–15)
BUN: 37 mg/dL — ABNORMAL HIGH (ref 8–23)
CO2: 20 mmol/L — ABNORMAL LOW (ref 22–32)
Calcium: 6.6 mg/dL — ABNORMAL LOW (ref 8.9–10.3)
Chloride: 95 mmol/L — ABNORMAL LOW (ref 98–111)
Creatinine, Ser: 6.78 mg/dL — ABNORMAL HIGH (ref 0.44–1.00)
GFR calc Af Amer: 7 mL/min — ABNORMAL LOW (ref >60)
GFR calc non Af Amer: 6 mL/min — ABNORMAL LOW (ref >60)
Glucose, Bld: 89 mg/dL (ref 70–99)
Potassium: 2.7 mmol/L — CL (ref 3.5–5.1)
Sodium: 132 mmol/L — ABNORMAL LOW (ref 135–145)
Total Bilirubin: 0.9 mg/dL (ref 0.3–1.2)
Total Protein: 5.8 g/dL — ABNORMAL LOW (ref 6.5–8.1)

## 2019-02-15 LAB — GLUCOSE, CAPILLARY
Glucose-Capillary: 141 mg/dL — ABNORMAL HIGH (ref 70–99)
Glucose-Capillary: 34 mg/dL — CL (ref 70–99)
Glucose-Capillary: 144 mg/dL — ABNORMAL HIGH (ref 70–99)

## 2019-02-15 LAB — MAGNESIUM: Magnesium: 1.9 mg/dL (ref 1.7–2.4)

## 2019-02-15 LAB — LACTIC ACID, PLASMA
Lactic Acid, Venous: 3.7 mmol/L (ref 0.5–1.9)
Lactic Acid, Venous: 2.6 mmol/L (ref 0.5–1.9)

## 2019-02-15 MED ORDER — POTASSIUM CHLORIDE CRYS ER 20 MEQ PO TBCR
30.0000 meq | EXTENDED_RELEASE_TABLET | Freq: Once | ORAL | Status: AC
  Administered 2019-02-15: 30 meq via ORAL
  Filled 2019-02-15: qty 2

## 2019-02-15 MED ORDER — DEXTROSE 50 % IV SOLN
INTRAVENOUS | Status: AC
  Filled 2019-02-15: qty 50

## 2019-02-15 MED ORDER — TRAMADOL HCL 50 MG PO TABS
50.0000 mg | ORAL_TABLET | Freq: Four times a day (QID) | ORAL | Status: DC | PRN
  Administered 2019-02-15 – 2019-02-16 (×2): 50 mg via ORAL
  Filled 2019-02-15: qty 1

## 2019-02-15 MED ORDER — POTASSIUM CHLORIDE 10 MEQ/100ML IV SOLN
10.0000 meq | INTRAVENOUS | Status: DC
  Filled 2019-02-15 (×4): qty 100

## 2019-02-15 MED ORDER — ATORVASTATIN CALCIUM 80 MG PO TABS
80.0000 mg | ORAL_TABLET | Freq: Every evening | ORAL | Status: DC
  Administered 2019-02-18 – 2019-02-19 (×2): 80 mg via ORAL
  Filled 2019-02-15 (×5): qty 1
  Filled 2019-02-15 (×2): qty 4

## 2019-02-15 MED ORDER — SODIUM CHLORIDE 0.9 % IV SOLN
Freq: Once | INTRAVENOUS | Status: AC
  Administered 2019-02-15: 17:00:00 via INTRAVENOUS

## 2019-02-15 MED ORDER — SODIUM CHLORIDE 0.9 % IV SOLN
Freq: Once | INTRAVENOUS | Status: AC
  Administered 2019-02-16: 17:00:00 via INTRAVENOUS

## 2019-02-15 MED ORDER — ASPIRIN EC 81 MG PO TBEC
81.0000 mg | DELAYED_RELEASE_TABLET | Freq: Every day | ORAL | Status: DC
  Administered 2019-02-16 – 2019-02-19 (×4): 81 mg via ORAL
  Filled 2019-02-15 (×4): qty 1

## 2019-02-15 MED ORDER — HYDROMORPHONE HCL 1 MG/ML PO LIQD: 0.5000 mg | ORAL | Status: DC | PRN

## 2019-02-15 MED ORDER — ACETAMINOPHEN 325 MG PO TABS
ORAL_TABLET | ORAL | Status: AC
  Administered 2019-02-15: 650 mg via ORAL
  Filled 2019-02-15: qty 2

## 2019-02-15 MED ORDER — ACETAMINOPHEN 325 MG PO TABS
650.0000 mg | ORAL_TABLET | Freq: Once | ORAL | Status: AC
  Administered 2019-02-15: 17:00:00 650 mg via ORAL

## 2019-02-15 MED ORDER — CALCIUM ACETATE (PHOS BINDER) 667 MG PO CAPS
667.0000 mg | ORAL_CAPSULE | Freq: Two times a day (BID) | ORAL | Status: DC
  Administered 2019-02-17 – 2019-02-18 (×3): 667 mg via ORAL
  Filled 2019-02-15 (×11): qty 1

## 2019-02-15 MED ORDER — GABAPENTIN 300 MG PO CAPS
300.0000 mg | ORAL_CAPSULE | Freq: Once | ORAL | Status: AC
  Administered 2019-02-15: 300 mg via ORAL
  Filled 2019-02-15: qty 1

## 2019-02-15 MED ORDER — TRAMADOL-ACETAMINOPHEN 37.5-325 MG PO TABS: 1.0000 | ORAL_TABLET | ORAL | Status: DC | PRN

## 2019-02-15 MED ORDER — HEPARIN SODIUM (PORCINE) 5000 UNIT/ML IJ SOLN
5000.0000 [IU] | Freq: Three times a day (TID) | INTRAMUSCULAR | Status: DC
  Administered 2019-02-16 – 2019-02-19 (×9): 5000 [IU] via SUBCUTANEOUS
  Filled 2019-02-15 (×9): qty 1

## 2019-02-15 MED ORDER — DEXTROSE 50 % IV SOLN
50.0000 mL | Freq: Once | INTRAVENOUS | Status: AC
  Administered 2019-02-15 – 2019-02-16 (×2): 50 mL via INTRAVENOUS

## 2019-02-15 NOTE — ED Notes (Signed)
Date and time results received: 02/14/2019 1534  Test: potassium Critical Value: 2.7  Name of Provider Notified: Dr. Archie Balboa  Orders Received? Or Actions Taken?: no new orders at this time

## 2019-02-15 NOTE — ED Notes (Signed)
Date and time results received: 03/03/2019 3:43 PM  (use smartphrase ".now" to insert current time)  Test: lactic Critical Value: 3.7  Name of Provider Notified: Dr. Nance Pear
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
## 2019-02-15 NOTE — ED Notes (Signed)
EDP Archie Balboa notified pt's BP trending down, last check 69/60. Order received for IVF NS @ 200 since pt is on dialysis and has hx CHF. Pt still c/o pain to BLE. Only tylenol ordered at this time d/t hypotension. Warm blankets placed over pt's legs

## 2019-02-15 NOTE — ED Notes (Signed)
Called hospitalist, Dr. Jannifer Franklin to request change for increased level of care due to unstable VS and hypoglycemia. Dr. Jannifer Franklin to seek placement in a stepdown bed.

## 2019-02-15 NOTE — ED Notes (Signed)
Per EDP Archie Balboa, when pt's potassium IVPB arrives, change NS to 159ml/hr for total 268ml/hr between K and NS.
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
## 2019-02-15 NOTE — ED Triage Notes (Signed)
Pt presents to ED via Theresa Rogers c/o bil shooting leg that started today. Pt states it feels like her skin is too tight for her legs. Dry scabbed-over wounds noted to L lateral ankle, posterior L knee, and L middle finger. No peri-wound redness, warmth, or swelling noted at this time. Hx CHF, DM. No leg swelling noted.   Pt does peritoneal dialysis nightly. States last night approx 765ml fluid removed, states normal amount is typically closer to 1L. Reports diarrhea for several days and says she has not been eating or drinking as much as usual because she did not want to have more diarrhea.   Old fistula present to L arm, ligation done 6/20 per chart review. HD catheter in place to R chest, pt states this is to be removed this Thursday. HD cath appears WNL but insertion site was uncovered. New tegaderm placed over site.

## 2019-02-16 ENCOUNTER — Encounter: Admission: EM | Disposition: E | Payer: Self-pay | Source: Home / Self Care | Attending: Internal Medicine

## 2019-02-16 ENCOUNTER — Ambulatory Visit: Admission: RE | Admit: 2019-02-16 | Payer: Medicare Other | Source: Home / Self Care | Admitting: Vascular Surgery

## 2019-02-16 ENCOUNTER — Other Ambulatory Visit: Admission: RE | Admit: 2019-02-16 | Payer: Medicare Other | Source: Ambulatory Visit

## 2019-02-16 DIAGNOSIS — L97419 Non-pressure chronic ulcer of right heel and midfoot with unspecified severity: Secondary | ICD-10-CM

## 2019-02-16 DIAGNOSIS — R197 Diarrhea, unspecified: Secondary | ICD-10-CM

## 2019-02-16 DIAGNOSIS — N186 End stage renal disease: Secondary | ICD-10-CM

## 2019-02-16 DIAGNOSIS — T82898A Other specified complication of vascular prosthetic devices, implants and grafts, initial encounter: Secondary | ICD-10-CM

## 2019-02-16 DIAGNOSIS — E1122 Type 2 diabetes mellitus with diabetic chronic kidney disease: Secondary | ICD-10-CM

## 2019-02-16 DIAGNOSIS — Z992 Dependence on renal dialysis: Secondary | ICD-10-CM

## 2019-02-16 DIAGNOSIS — L98499 Non-pressure chronic ulcer of skin of other sites with unspecified severity: Secondary | ICD-10-CM

## 2019-02-16 DIAGNOSIS — I2510 Atherosclerotic heart disease of native coronary artery without angina pectoris: Secondary | ICD-10-CM

## 2019-02-16 DIAGNOSIS — I480 Paroxysmal atrial fibrillation: Secondary | ICD-10-CM

## 2019-02-16 DIAGNOSIS — I120 Hypertensive chronic kidney disease with stage 5 chronic kidney disease or end stage renal disease: Secondary | ICD-10-CM

## 2019-02-16 DIAGNOSIS — L97829 Non-pressure chronic ulcer of other part of left lower leg with unspecified severity: Secondary | ICD-10-CM

## 2019-02-16 DIAGNOSIS — Z951 Presence of aortocoronary bypass graft: Secondary | ICD-10-CM

## 2019-02-16 DIAGNOSIS — I959 Hypotension, unspecified: Secondary | ICD-10-CM

## 2019-02-16 DIAGNOSIS — E11622 Type 2 diabetes mellitus with other skin ulcer: Secondary | ICD-10-CM

## 2019-02-16 HISTORY — PX: DIALYSIS/PERMA CATHETER REMOVAL: CATH118289

## 2019-02-16 LAB — GASTROINTESTINAL PANEL BY PCR, STOOL (REPLACES STOOL CULTURE)

## 2019-02-16 LAB — COMPREHENSIVE METABOLIC PANEL
ALT: 20 U/L (ref 0–44)
AST: 25 U/L (ref 15–41)
Albumin: 2 g/dL — ABNORMAL LOW (ref 3.5–5.0)
Alkaline Phosphatase: 338 U/L — ABNORMAL HIGH (ref 38–126)
Anion gap: 14 (ref 5–15)
BUN: 40 mg/dL — ABNORMAL HIGH (ref 8–23)
CO2: 22 mmol/L (ref 22–32)
Calcium: 6.4 mg/dL — CL (ref 8.9–10.3)
Chloride: 98 mmol/L (ref 98–111)
Creatinine, Ser: 6.62 mg/dL — ABNORMAL HIGH (ref 0.44–1.00)
GFR calc Af Amer: 7 mL/min — ABNORMAL LOW (ref >60)
GFR calc non Af Amer: 6 mL/min — ABNORMAL LOW (ref >60)
Glucose, Bld: 84 mg/dL (ref 70–99)
Potassium: 2.7 mmol/L — CL (ref 3.5–5.1)
Sodium: 134 mmol/L — ABNORMAL LOW (ref 135–145)
Total Bilirubin: 0.9 mg/dL (ref 0.3–1.2)
Total Protein: 5.2 g/dL — ABNORMAL LOW (ref 6.5–8.1)

## 2019-02-16 LAB — SARS CORONAVIRUS 2 (TAT 6-24 HRS): SARS Coronavirus 2: NEGATIVE

## 2019-02-16 LAB — CLOSTRIDIUM DIFFICILE BY PCR, REFLEXED: Toxigenic C. Difficile by PCR: POSITIVE — AB

## 2019-02-16 LAB — CBC
HCT: 33 % — ABNORMAL LOW (ref 36.0–46.0)
Hemoglobin: 11.3 g/dL — ABNORMAL LOW (ref 12.0–15.0)
MCH: 31.5 pg (ref 26.0–34.0)
MCHC: 34.2 g/dL (ref 30.0–36.0)
MCV: 91.9 fL (ref 80.0–100.0)
Platelets: 206 10*3/uL (ref 150–400)
RBC: 3.59 MIL/uL — ABNORMAL LOW (ref 3.87–5.11)
RDW: 15.9 % — ABNORMAL HIGH (ref 11.5–15.5)
WBC: 9.5 10*3/uL (ref 4.0–10.5)
nRBC: 0 % (ref 0.0–0.2)

## 2019-02-16 LAB — GLUCOSE, CAPILLARY
Glucose-Capillary: 108 mg/dL — ABNORMAL HIGH (ref 70–99)
Glucose-Capillary: 116 mg/dL — ABNORMAL HIGH (ref 70–99)
Glucose-Capillary: 123 mg/dL — ABNORMAL HIGH (ref 70–99)
Glucose-Capillary: 150 mg/dL — ABNORMAL HIGH (ref 70–99)
Glucose-Capillary: 178 mg/dL — ABNORMAL HIGH (ref 70–99)
Glucose-Capillary: 55 mg/dL — ABNORMAL LOW (ref 70–99)
Glucose-Capillary: 77 mg/dL (ref 70–99)
Glucose-Capillary: 79 mg/dL (ref 70–99)
Glucose-Capillary: 95 mg/dL (ref 70–99)

## 2019-02-16 LAB — C DIFFICILE QUICK SCREEN W PCR REFLEX
C Diff antigen: POSITIVE — AB
C Diff toxin: NEGATIVE

## 2019-02-16 LAB — CORTISOL: Cortisol, Plasma: 15.5 ug/dL

## 2019-02-16 LAB — MRSA PCR SCREENING: MRSA by PCR: NEGATIVE

## 2019-02-16 SURGERY — DIALYSIS/PERMA CATHETER REMOVAL
Anesthesia: LOCAL

## 2019-02-16 MED ORDER — SODIUM CHLORIDE 0.9 % IV BOLUS
500.0000 mL | Freq: Once | INTRAVENOUS | Status: AC
  Administered 2019-02-16: 500 mL via INTRAVENOUS

## 2019-02-16 MED ORDER — ONDANSETRON HCL 4 MG/2ML IJ SOLN
4.0000 mg | Freq: Four times a day (QID) | INTRAMUSCULAR | Status: DC | PRN
  Administered 2019-02-16 – 2019-02-19 (×7): 4 mg via INTRAVENOUS
  Filled 2019-02-16 (×7): qty 2

## 2019-02-16 MED ORDER — INSULIN ASPART 100 UNIT/ML ~~LOC~~ SOLN
0.0000 [IU] | Freq: Every day | SUBCUTANEOUS | Status: DC
  Administered 2019-02-18: 23:00:00 3 [IU] via SUBCUTANEOUS
  Filled 2019-02-16: qty 1

## 2019-02-16 MED ORDER — POTASSIUM CHLORIDE CRYS ER 20 MEQ PO TBCR
40.0000 meq | EXTENDED_RELEASE_TABLET | Freq: Every day | ORAL | Status: DC
  Administered 2019-02-18: 40 meq via ORAL
  Filled 2019-02-16 (×2): qty 2

## 2019-02-16 MED ORDER — RENA-VITE PO TABS
1.0000 | ORAL_TABLET | Freq: Every day | ORAL | Status: DC
  Administered 2019-02-16 – 2019-02-19 (×3): 1 via ORAL
  Filled 2019-02-16 (×3): qty 1

## 2019-02-16 MED ORDER — DEXTROSE 50 % IV SOLN
INTRAVENOUS | Status: AC
  Administered 2019-02-16: 50 mL via INTRAVENOUS
  Filled 2019-02-16: qty 50

## 2019-02-16 MED ORDER — MIDODRINE HCL 5 MG PO TABS
5.0000 mg | ORAL_TABLET | Freq: Three times a day (TID) | ORAL | Status: DC
  Administered 2019-02-16 – 2019-02-18 (×6): 5 mg via ORAL
  Filled 2019-02-16 (×8): qty 1

## 2019-02-16 MED ORDER — SODIUM CHLORIDE 0.9 % IV SOLN
INTRAVENOUS | Status: DC
  Administered 2019-02-16 – 2019-02-17 (×2): via INTRAVENOUS

## 2019-02-16 MED ORDER — INSULIN ASPART 100 UNIT/ML ~~LOC~~ SOLN
0.0000 [IU] | Freq: Three times a day (TID) | SUBCUTANEOUS | Status: DC
  Administered 2019-02-16: 1 [IU] via SUBCUTANEOUS
  Administered 2019-02-17 (×3): 2 [IU] via SUBCUTANEOUS
  Administered 2019-02-18: 18:00:00 3 [IU] via SUBCUTANEOUS
  Administered 2019-02-19: 2 [IU] via SUBCUTANEOUS
  Administered 2019-02-20: 7 [IU] via SUBCUTANEOUS
  Filled 2019-02-16 (×6): qty 1

## 2019-02-16 MED ORDER — CHLORHEXIDINE GLUCONATE CLOTH 2 % EX PADS
6.0000 | MEDICATED_PAD | Freq: Every day | CUTANEOUS | Status: DC
  Administered 2019-02-16 – 2019-02-20 (×5): 6 via TOPICAL

## 2019-02-16 MED ORDER — NEPRO/CARBSTEADY PO LIQD
237.0000 mL | Freq: Two times a day (BID) | ORAL | Status: DC
  Administered 2019-02-18 – 2019-02-19 (×4): 237 mL via ORAL

## 2019-02-16 MED ORDER — LIDOCAINE-EPINEPHRINE (PF) 1 %-1:200000 IJ SOLN
INTRAMUSCULAR | Status: DC | PRN
  Administered 2019-02-16: 20 mL via INTRADERMAL

## 2019-02-16 MED ORDER — LOPERAMIDE HCL 2 MG PO CAPS
4.0000 mg | ORAL_CAPSULE | Freq: Four times a day (QID) | ORAL | Status: AC
  Administered 2019-02-16 – 2019-02-17 (×2): 4 mg via ORAL
  Filled 2019-02-16 (×3): qty 2

## 2019-02-16 MED ORDER — POTASSIUM CHLORIDE CRYS ER 20 MEQ PO TBCR
40.0000 meq | EXTENDED_RELEASE_TABLET | ORAL | Status: AC
  Administered 2019-02-16 (×2): 40 meq via ORAL
  Filled 2019-02-16 (×2): qty 2

## 2019-02-16 MED ORDER — SODIUM CHLORIDE 0.9 % IV SOLN
0.0000 ug/min | INTRAVENOUS | Status: DC
  Administered 2019-02-16: 20 ug/min via INTRAVENOUS
  Administered 2019-02-17: 40 ug/min via INTRAVENOUS
  Filled 2019-02-16 (×2): qty 1

## 2019-02-16 MED ORDER — DEXTROSE-NACL 5-0.45 % IV SOLN
INTRAVENOUS | Status: DC
  Administered 2019-02-16: 12:00:00 via INTRAVENOUS

## 2019-02-16 MED ORDER — DEXTROSE 50 % IV SOLN
12.5000 g | INTRAVENOUS | Status: AC
  Filled 2019-02-16: qty 50

## 2019-02-16 MED ORDER — VANCOMYCIN 50 MG/ML ORAL SOLUTION
125.0000 mg | Freq: Four times a day (QID) | ORAL | Status: DC
  Administered 2019-02-16 – 2019-02-19 (×12): 125 mg via ORAL
  Filled 2019-02-16 (×18): qty 2.5

## 2019-02-16 SURGICAL SUPPLY — 2 items
FORCEPS HALSTEAD CVD 5IN STRL (INSTRUMENTS) ×1 IMPLANT
TRAY LACERAT/PLASTIC (MISCELLANEOUS) ×1 IMPLANT
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
## 2019-02-16 NOTE — Progress Notes (Signed)
Established peritoneal dialysis patient known at [facility]. Please contact me directly with any dialysis placement concerns.  Elvera Bicker Dialysis Coordinator [phone]

## 2019-02-16 NOTE — Progress Notes (Signed)
   02/15/2019 1717  Clinical Encounter Type  Visited With Patient not available;Health care provider  Visit Type Code  Referral From Nurse   Chaplain received a Rapid Response page to the patient's room. Upon arrival, the patient was being assessed by the medical team. The patient was observed to be awake. This chaplain maintained pastoral presence outside the patient's room. The patient's nurse reported that the patient was being transferred to the Step-down unit due to low blood pressure. The patient's nurse made me aware that I was not needed, but was encouraged to call or page if the patient's condition changed.

## 2019-02-16 NOTE — Consult Note (Signed)
I have placed a request via Secure Chat to Dr. Posey Pronto requesting photos of the wound areas of concern to be placed in the EMR.    [place], [place], [place]

## 2019-02-16 NOTE — Progress Notes (Signed)
[facility]  ROUNDING NOTE   Subjective:   [name] admitted to [facility] on 03/06/2019 for Cough [R05] Dehydration [E86.0] Hypokalemia [E87.6] Leg cramping [R25.2]  Patient did not get peritoneal dialysis last night. Started on D5 1/2NS infusion.   Objective:  Vital signs in last 24 hours:  Temp:  [96.2 F (35.7 C)-98.1 F (36.7 C)] 96.2 F (35.7 C) (09/08 0444) Pulse Rate:  [58-75] 66 (09/08 1147) Resp:  [10-26] 20 (09/08 0439) BP: (60-120)/(16-93) 104/84 (09/08 1147) SpO2:  [91 %-100 %] 96 % (09/08 1147) Weight:  [60.5 kg-70.6 kg] 70.6 kg (09/08 0112)  Weight change:  Filed Weights   02/22/2019 1451 02/20/2019 0112  Weight: 60.5 kg 70.6 kg    Intake/Output: I/O last 3 completed shifts: In: 1150 [P.O.:150; I.V.:1000] Out: -    Intake/Output this shift:  No intake/output data recorded.  Physical Exam: General: NAD, sitting in bed  Head: Normocephalic, atraumatic. Moist oral mucosal membranes  Eyes: Anicteric, PERRL  Neck: Supple, trachea midline  Lungs:  Clear to auscultation  Heart: Regular rate and rhythm  Abdomen:  Soft, nontender,   Extremities:  trace peripheral edema.  Neurologic: Nonfocal, moving all four extremities  Skin: No lesions  Access: RIJ permcath, left AVF, PD catheter    Basic Metabolic Panel: Recent Labs  Lab 02/22/2019 1459 03/08/2019 0143  NA 132* 134*  K 2.7* 2.7*  CL 95* 98  CO2 20* 22  GLUCOSE 89 84  BUN 37* 40*  CREATININE 6.78* 6.62*  CALCIUM 6.6* 6.4*  MG 1.9  --     Liver Function Tests: Recent Labs  Lab 02/23/2019 1459 03/10/2019 0143  AST 27 25  ALT 22 20  ALKPHOS 342* 338*  BILITOT 0.9 0.9  PROT 5.8* 5.2*  ALBUMIN 2.3* 2.0*   No results for input(s): LIPASE, AMYLASE in the last 168 hours. No results for input(s): AMMONIA in the last 168 hours.  CBC: Recent Labs  Lab 03/06/2019 1459 03/03/2019 0143  WBC 11.3* 9.5  NEUTROABS 9.4*  --   HGB 12.0 11.3*  HCT 35.6* 33.0*  MCV 92.0 91.9  PLT 233  206    Cardiac Enzymes: No results for input(s): CKTOTAL, CKMB, CKMBINDEX, TROPONINI in the last 168 hours.  BNP: Invalid input(s): POCBNP  CBG: Recent Labs  Lab 02/26/2019 0119 03/05/2019 0620 03/02/2019 0853 02/13/2019 1118 02/09/2019 1147  GLUCAP 77 150* 79 77* 108*    Microbiology: Results for orders placed or performed during the hospital encounter of 02/14/2019  SARS CORONAVIRUS 2 (TAT 6-24 HRS) Nasopharyngeal Nasopharyngeal Swab     Status: None   Collection Time: 02/15/19  8:20 PM   Specimen: Nasopharyngeal Swab  Result Value Ref Range Status   SARS Coronavirus 2 NEGATIVE NEGATIVE Final    Comment: (NOTE) SARS-CoV-2 target nucleic acids are NOT DETECTED. The SARS-CoV-2 RNA is generally detectable in upper and lower respiratory specimens during the acute phase of infection. Negative results do not preclude SARS-CoV-2 infection, do not rule out co-infections with other pathogens, and should not be used as the sole basis for treatment or other patient management decisions. Negative results must be combined with clinical observations, patient history, and epidemiological information. The expected result is Negative. Fact Sheet for Patients: SugarRoll.be Fact Sheet for Healthcare Providers: https://www.woods-mathews.com/ This test is not yet approved or cleared by the [place] FDA and  has been authorized for detection and/or diagnosis of SARS-CoV-2 by FDA under an Emergency Use Authorization (EUA). This EUA will remain  in  effect (meaning this test can be used) for the duration of the COVID-19 declaration under Section 56 4(b)(1) of the Act, 21 U.S.C. section 360bbb-3(b)(1), unless the authorization is terminated or revoked sooner. Performed at [facility], [place] [address], [place], [place] [address]   Gastrointestinal Panel by PCR , Stool     Status: None   Collection Time: 02/12/2019  7:53 AM   Specimen: Stool  Result  Value Ref Range Status   Campylobacter species NOT DETECTED NOT DETECTED Final   Plesimonas shigelloides NOT DETECTED NOT DETECTED Final   Salmonella species NOT DETECTED NOT DETECTED Final   Yersinia enterocolitica NOT DETECTED NOT DETECTED Final   Vibrio species NOT DETECTED NOT DETECTED Final   Vibrio cholerae NOT DETECTED NOT DETECTED Final   Enteroaggregative E coli (EAEC) NOT DETECTED NOT DETECTED Final   Enteropathogenic E coli (EPEC) NOT DETECTED NOT DETECTED Final   Enterotoxigenic E coli (ETEC) NOT DETECTED NOT DETECTED Final   Shiga like toxin producing E coli (STEC) NOT DETECTED NOT DETECTED Final   Shigella/Enteroinvasive E coli (EIEC) NOT DETECTED NOT DETECTED Final   Cryptosporidium NOT DETECTED NOT DETECTED Final   Cyclospora cayetanensis NOT DETECTED NOT DETECTED Final   Entamoeba histolytica NOT DETECTED NOT DETECTED Final   Giardia lamblia NOT DETECTED NOT DETECTED Final   Adenovirus F40/41 NOT DETECTED NOT DETECTED Final   Astrovirus NOT DETECTED NOT DETECTED Final   Norovirus GI/GII NOT DETECTED NOT DETECTED Final   Rotavirus A NOT DETECTED NOT DETECTED Final   Sapovirus (I, II, IV, and V) NOT DETECTED NOT DETECTED Final    Comment: Performed at [facility], [address] [place], [place] [address]  C Difficile Quick Screen w PCR reflex     Status: Abnormal   Collection Time: 03/08/2019  7:53 AM   Specimen: STOOL  Result Value Ref Range Status   C Diff antigen POSITIVE (A) NEGATIVE Final   C Diff toxin NEGATIVE NEGATIVE Final   C Diff interpretation Results are indeterminate. See PCR results.  Final    Comment: Performed at [facility], [address] [place], [place] [address]  C. Diff by PCR, Reflexed     Status: Abnormal   Collection Time: 03/04/2019  7:53 AM  Result Value Ref Range Status   Toxigenic C. Difficile by PCR POSITIVE (A) NEGATIVE Final    Comment: Positive for toxigenic C. difficile with little to no toxin production. Only  treat if clinical presentation suggests symptomatic illness. Performed at [facility], [address] [place], [place] [address]     Coagulation Studies: No results for input(s): LABPROT, INR in the last 72 hours.  Urinalysis: No results for input(s): COLORURINE, LABSPEC, PHURINE, GLUCOSEU, HGBUR, BILIRUBINUR, KETONESUR, PROTEINUR, UROBILINOGEN, NITRITE, LEUKOCYTESUR in the last 72 hours.  Invalid input(s): APPERANCEUR    Imaging: Dg Chest Port 1 View  Result Date: 03/09/2019 CLINICAL DATA:  Initial evaluation for acute cough. EXAM: PORTABLE CHEST 1 VIEW COMPARISON:  Prior radiograph from 12/28/2018. FINDINGS: Median sternotomy wires underlying CABG markers and surgical clips, stable. Cardiomegaly unchanged. Mediastinal silhouette within normal limits. Right-sided hemodialysis catheter in place with tip overlying the cavoatrial junction. Lungs are hypoinflated. Small to moderate right pleural effusion with associated right basilar opacity, likely atelectasis, similar to previous. Streaky left basilar atelectatic changes also similar. Mild perihilar vascular congestion without pulmonary edema. No other new focal infiltrates. No pneumothorax. No acute osseous finding. IMPRESSION: 1. Small to moderate right pleural effusion, similar to previous.  2. Superimposed bibasilar atelectatic changes, also similar. No new focal infiltrates. 3. Cardiomegaly with mild perihilar vascular congestion without overt pulmonary edema. Electronically Signed   By: Jeannine Boga M.D.   On: 02/28/2019 22:22     Medications:   . [MAR Hold] sodium chloride    . dextrose 5 % and 0.45% NaCl 50 mL/hr at 02/09/2019 1156   . [MAR Hold] aspirin EC  81 mg Oral Daily  . [MAR Hold] atorvastatin  80 mg Oral QPM  . [MAR Hold] calcium acetate  667 mg Oral BID WC  . [MAR Hold] dextrose  12.5 g Intravenous STAT  . [MAR Hold] heparin  5,000 Units Subcutaneous Q8H  . [MAR Hold] insulin aspart  0-5 Units  Subcutaneous QHS  . [MAR Hold] insulin aspart  0-9 Units Subcutaneous TID WC  . [MAR Hold] loperamide  4 mg Oral Q6H  . [MAR Hold] midodrine  5 mg Oral TID WC  . [MAR Hold] potassium chloride  40 mEq Oral Daily   [MAR Hold] HYDROmorphone HCl, [MAR Hold] ondansetron (ZOFRAN) IV, [MAR Hold] traMADol  Assessment/ Plan:  [name] is a 68 y.o. white female with end stage renal disease on peritoneal dialysis, hypertension, congestive heart failure, coronary artery disease, diabetes mellitus type II  [facility] 61kg Peritoneal dialysis CCPD 9 hours 5 exchanges 2 liter fills and last fill of 554mL.   1. End Stage Renal Disease - Scheduled peritoneal dialysis for tonight. Orders prepared.  - Consult vascular to have RIJ permcath removed.   2. Hypotension: holding home regimen of furosemide, hydralazine and carvedilol.   3. Anemia of chronic kidney disease:  Hemoglobin 11.3 - epo as outpatient.   4. Secondary Hyperparathyroidism: with hyperphosphatemia. Outpatient labs on 9/2 phos 8, calcium 6.8 - calcium acetate with meals.   5. Hypokalemia:  - Potassium supplementation.  - Hold furosemide.    LOS: 1 Theresa Rogers 9/8/20202:25 PM

## 2019-02-16 NOTE — Consult Note (Signed)
Name: Theresa Rogers MRN: [id number] DOB: 06-07-1951    ADMISSION DATE:  03/07/2019 CONSULTATION DATE:  02/27/2019   REFERRING MD :  Dr. Posey Pronto   CHIEF COMPLAINT:  Hypotension  BRIEF PATIENT DESCRIPTION:  68 year old female admitted 9/7 with generalized weakness, and acute on chronic diarrhea secondary to C. Difficile.  Became hypotensive on 9/8 (likely secondary to hypovolemic shock) requiring transfer to ICU for possible pressors.  SIGNIFICANT EVENTS  9/7>> admission to MedSurg unit 9/8>> transfer to ICU due to hypotension 9/8>> removal of clotted PermCath by vascular surgery 9/8>> ID consulted 9/9>> Right Femora A-line placed  STUDIES:  CXR 9/7>> 1. Small to moderate right pleural effusion, similar to previous. 2. Superimposed bibasilar atelectatic changes, also similar. No new focal infiltrates. 3. Cardiomegaly with mild perihilar vascular congestion without overt pulmonary edema.  CULTURES: SARS-CoV-2 PCR 9/7>> negative MRSA PCR 9/8>> negative C. difficile by PCR 9/8>> antigen positive, toxin negative; + Toxigenic C-diff GI panel PCR 9/8>> negative Blood x2 9/8>> Urine 9/8>>  ANTIBIOTICS: PO Vancomycin 9/8>>  HISTORY OF PRESENT ILLNESS:   Theresa Rogers is a 68 year old female with a past medical history notable for HFrEF, ESRD on peritoneal dialysis, diabetes, hypertension, MI, paroxysmal atrial fibrillation, anemia who presented to [facility] ED on 9/7 with complaints of generalized weakness, worsening diarrhea, and bilateral lower extremity achiness.  She was admitted to the [place] unit for treatment of acute on chronic diarrhea and hypokalemia.  Her C. difficile PCR came back positive for the antigen, negative toxin, and positive for toxigenic C. Difficile.  ID was consulted to assist with antibiotics.  However on 9/8 she developed hypotension secondary to dehydration in setting of acute on chronic diarrhea, requiring transfer to ICU for possible pressors.  PCCM was consulted  for further management.  PAST MEDICAL HISTORY :   has a past medical history of Anemia, CAD (coronary artery disease), Chronic systolic CHF (congestive heart failure) ([place]), Complication of anesthesia, Diabetes mellitus without complication ([place]), ESRD (end stage renal disease) ([place]), HLD (hyperlipidemia), HTN (hypertension), Ischemic cardiomyopathy, Myocardial infarction ([place]) (2018), and PAF (paroxysmal atrial fibrillation) ([place]).  has a past surgical history that includes Coronary angioplasty with stent; Cardiac catheterization (N/A, 06/22/2015); Cardiac catheterization (N/A, 06/26/2015); Cardiac catheterization (N/A, 08/03/2015); Cardiac catheterization (N/A, 05/20/2016); Coronary artery bypass graft (N/A, 05/27/2016); TEE without cardioversion (N/A, 05/27/2016); ir generic historical (06/04/2016); ir generic historical (06/04/2016); Eye surgery (2018); AV fistula placement (Left, 07/09/2018); DIALYSIS/PERMA CATHETER INSERTION (N/A, 07/28/2018); Abdominal surgery; CAPD insertion (N/A, 11/19/2018); and Ligation of arteriovenous  fistula (Left, 11/19/2018). Prior to Admission medications   Medication Sig Start Date End Date Taking? Authorizing Provider  aspirin EC 81 MG tablet Take 81 mg by mouth daily.   Yes [provider]  atorvastatin (LIPITOR) 80 MG tablet Take 80 mg by mouth every evening. 10/21/18  Yes [provider]  calcium acetate (PHOSLO) 667 MG capsule Take 667 mg by mouth 2 (two) times daily with a meal.    Yes [provider]  hydrALAZINE (APRESOLINE) 25 MG tablet Take 25 mg by mouth every 12 (twelve) hours.   Yes [provider]  insulin glargine (LANTUS) 100 UNIT/ML injection Inject 16 Units into the skin at bedtime.    Yes [provider]  insulin lispro (HUMALOG) 100 UNIT/ML injection Inject 2-15 Units into the skin 3 (three) times daily before meals. Per sliding scale   Yes [provider]  multivitamin (RENA-VIT) TABS tablet Take 1  tablet by mouth daily.  03/24/18  Yes [provider]  carvedilol (COREG) 6.25 MG tablet Take 1 tablet (6.25 mg total) by mouth 2 (two) times daily. Do not take the morning of dialysis. 07/07/18 12/28/18  Theora Gianotti, NP  cephALEXin (KEFLEX) 500 MG capsule Take 1 capsule (500 mg total) by mouth daily. Patient not taking: Reported on 02/27/2019 12/30/18   Fritzi Mandes, MD  diphenoxylate-atropine (LOMOTIL) 2.5-0.025 MG tablet Take 2 tablets by mouth 4 (four) times daily as needed for diarrhea or loose stools. 12/30/18   Fritzi Mandes, MD  HYDROcodone-acetaminophen (NORCO/VICODIN) 5-325 MG tablet Take 1 tablet by mouth every 6 (six) hours as needed for moderate pain. Patient not taking: Reported on 02/16/2019 11/19/18   Algernon Huxley, MD  ondansetron (ZOFRAN) 8 MG tablet TK 1 T PO Q 12 H PRN 12/17/18   [provider]   Allergies  Allergen Reactions  . No Known Allergies     FAMILY HISTORY:  family history includes Alzheimer's disease in her mother; CAD in her mother; Diabetes in her brother; Kidney failure in her brother; Prostate cancer in her father. SOCIAL HISTORY:  reports that she has never smoked. She has never used smokeless tobacco. She reports current alcohol use. She reports that she does not use drugs.   COVID-19 DISASTER DECLARATION:  FULL CONTACT PHYSICAL EXAMINATION WAS NOT POSSIBLE DUE TO TREATMENT OF COVID-19 AND  CONSERVATION OF PERSONAL PROTECTIVE EQUIPMENT, LIMITED EXAM FINDINGS INCLUDE-  Patient assessed or the symptoms described in the history of present illness.  In the context of the Global COVID-19 pandemic, which necessitated consideration that the patient might be at risk for infection with the SARS-CoV-2 virus that causes COVID-19, Institutional protocols and algorithms that pertain to the evaluation of patients at risk for COVID-19 are in a state of rapid change based on information released by regulatory bodies including the CDC and federal and  state organizations. These policies and algorithms were followed during the patient's care while in hospital.  REVIEW OF SYSTEMS:  Positives in BOLD Constitutional: Negative for fever, chills, weight loss, malaise/fatigue and diaphoresis.,  +Generalized weakness HENT: Negative for hearing loss, ear pain, nosebleeds, congestion, sore throat, neck pain, tinnitus and ear discharge.   Eyes: Negative for blurred vision, double vision, photophobia, pain, discharge and redness.  Respiratory: Negative for cough, hemoptysis, sputum production, shortness of breath, wheezing and stridor.   Cardiovascular: Negative for chest pain, palpitations, orthopnea, claudication, leg swelling and PND.  Gastrointestinal: Negative for heartburn, nausea, vomiting, abdominal pain, diarrhea, constipation, blood in stool and melena.  Genitourinary: Negative for dysuria, urgency, frequency, hematuria and flank pain.  Musculoskeletal: Negative for myalgias, back pain, joint pain and falls.  Skin: Negative for itching and rash.  Neurological: Negative for dizziness, tingling, tremors, sensory change, speech change, focal weakness, seizures, loss of consciousness, weakness and headaches.  Endo/Heme/Allergies: Negative for environmental allergies and polydipsia. Does not bruise/bleed easily.  SUBJECTIVE:  -Patient reports generalized weakness -Denies chest pain, shortness of breath, abdominal pain, nausea vomiting, or -diarrhea -Denies lightheadedness or dizziness, or vision changes -SBP currently in the 70s with map 63 -Patient is asymptomatic, awake, alert and oriented -On room air  VITAL SIGNS: Temp:  [96.2 F (35.7 C)-97.7 F (36.5 C)] 97.7 F (36.5 C) (09/08 1808) Pulse Rate:  [41-68] 58 (09/08 2030) Resp:  [10-26] 11 (09/08 2030) BP: (57-120)/(16-93) 72/58 (09/08 2030) SpO2:  [92 %-100 %] 93 % (09/08 2030) Weight:  [61.8 kg-70.6 kg] 61.8 kg (09/08 1808)  PHYSICAL EXAMINATION: General: Acutely ill-appearing  female, sitting in bed,  on room air, no acute distress Neuro: Sleeping, arouses to voice, alert and oriented x4, follows commands, no focal deficits, speech clear HEENT: Atraumatic, normocephalic, neck supple, no JVD, pupils PERRLA Cardiovascular: Regular rate and rhythm, S1-S2, no murmurs rubs or gallops Lungs: Clear to auscultation bilaterally with fine crackles bilateral bases, no wheezing, even, nonlabored, normal effort Abdomen: Soft, nontender, nondistended, no guarding or rebound tenderness, bowel sounds positive x4 Musculoskeletal: Generalized weakness, no deformities, no edema Skin: Warm and dry, abrasion to left thigh and left malleolus  Recent Labs  Lab 03/03/2019 1459 02/09/2019 0143  NA 132* 134*  K 2.7* 2.7*  CL 95* 98  CO2 20* 22  BUN 37* 40*  CREATININE 6.78* 6.62*  GLUCOSE 89 84   Recent Labs  Lab 02/11/2019 1459 02/26/2019 0143  HGB 12.0 11.3*  HCT 35.6* 33.0*  WBC 11.3* 9.5  PLT 233 206   Dg Chest Port 1 View  Result Date: 03/03/2019 CLINICAL DATA:  Initial evaluation for acute cough. EXAM: PORTABLE CHEST 1 VIEW COMPARISON:  Prior radiograph from 12/28/2018. FINDINGS: Median sternotomy wires underlying CABG markers and surgical clips, stable. Cardiomegaly unchanged. Mediastinal silhouette within normal limits. Right-sided hemodialysis catheter in place with tip overlying the cavoatrial junction. Lungs are hypoinflated. Small to moderate right pleural effusion with associated right basilar opacity, likely atelectasis, similar to previous. Streaky left basilar atelectatic changes also similar. Mild perihilar vascular congestion without pulmonary edema. No other new focal infiltrates. No pneumothorax. No acute osseous finding. IMPRESSION: 1. Small to moderate right pleural effusion, similar to previous. 2. Superimposed bibasilar atelectatic changes, also similar. No new focal infiltrates. 3. Cardiomegaly with mild perihilar vascular congestion without overt pulmonary edema.  Electronically Signed   By: Jeannine Boga M.D.   On: 02/25/2019 22:22    ASSESSMENT / PLAN:  Hypotension, likely hypovolemic shock in setting of Acute on Chronic Diarrhea Chronic HFrEF without acute exacerbation (EF 20 to 25%) Hx: Paroxysmal atrial fib, HTN, Hyperlipidemia -Continuous cardiac monitoring -Maintain MAP greater than 60 given she is ESRD -Received 500 mL bolus -Gentle IV fluids given CHF history: NS @ 50 ml/hr -Holding home Lasix, hydralazine, Coreg given hypotension -Continue Midodrine -Neo-Synephrine gtt if needed to maintain map goal -Will place Arterial Line for accurate BP measurement -Peritoneal dialysis for volume removal -Patient not on anticoagulation for atrial fib due to chronic anemia  ESRD on Peritoneal Dialysis Hypokalemia>>resolved -Monitor I&O's / urinary output -Follow BMP -Ensure adequate renal perfusion -Avoid nephrotoxic agents as able -Replace electrolytes as indicated -Nephrology following, appreciate input ~peritoneal dialysis as per nephrology -Clotted PermCath removed 9/8 by vascular surgery  Acute on chronic diarrhea, C. difficile antigen positive but toxin negative ~  +TOXIGENIC C-DIFF -Monitor fever curve -Trend WBCs -Check procalcitonin ~ 0.18 -Follow blood and urine cultures as above -Chest x-ray without infiltrate -Will place on empiric p.o. vancomycin for now (given hypotension today) -ID consulted to assess need for continued antibiotic treatment, appreciate input  Anemia of chronic disease -Monitor for S/Sx of bleeding -Trend CBC -Subcu heparin for VTE Prophylaxis  -Transfuse for Hgb <7  Diabetes mellitus -CBGs -SSI -Follow ICU hypo-hyperglycemia protocol          Disposition: ICU Goals of care: Full code VTE prophylaxis: Subcu heparin Updates: Updated patient at bedside 02/10/2019  Darel Hong, [facility] Pager: [phone] Cell: [phone]  02/15/2019,  9:02 PM

## 2019-02-16 NOTE — Progress Notes (Signed)
Initial Nutrition Assessment  RD working remotely.  DOCUMENTATION CODES:   Not applicable  INTERVENTION:  Provide Nepro Shake po BID, each supplement provides 425 kcal and 19 grams protein.  Provide Rena-vite QHS.  NUTRITION DIAGNOSIS:   Increased nutrient needs related to wound healing, catabolic illness(ESRD on PD, CHF) as evidenced by estimated needs.  GOAL:   Patient will meet greater than or equal to 90% of their needs  MONITOR:   PO intake, Supplement acceptance, Labs, Weight trends, Skin, I & O's  REASON FOR ASSESSMENT:   Malnutrition Screening Tool    ASSESSMENT:   68 year old female with PMHx of DM, chronic systolic CHF, paroxysmal A-fib, HTN, HLD, ESRD on PD, CAD, hx MI admitted with weakness, acute on chronic diarrhea.   Patient is on enteric precautions. Attempted to call patient over the phone but she was unable to answer. She is currently on renal/carbohydrate modified diet. No meal completion recorded at this time. Patient's weight appears to fluctuate per chart, which may be related to fluid status. She has been 62-65 kg in the past 8 months. She is currently 61.8 kg (136.24 lbs).  Medications reviewed and include: Phoslo 667 mg BID, Novolog 0-9 units TID, Novolog 0-5 units QHS, Imodium 4 mg Q6hrs, potassium chloride 40 mEq daily, D5-1/2NS at 50 mL/hr.  Labs reviewed: CBG 55-150, Sodium 134, Potassium 2.7, BUN 40, Creatinine 6.62.  NUTRITION - FOCUSED PHYSICAL EXAM:  Unable to complete at this time.  Diet Order:   Diet Order            Diet renal/carb modified with fluid restriction Diet-HS Snack? Nothing; Fluid restriction: 1200 mL Fluid; Room service appropriate? Yes; Fluid consistency: Thin  Diet effective now             EDUCATION NEEDS:   No education needs have been identified at this time  Skin:  Skin Assessment: Skin Integrity Issues:(non-pressure wound left knee)  Last BM:  02/27/2019 - medium type 6  Height:   Ht Readings from Last  1 Encounters:  02/14/2019 5\' 4"  (1.626 m)   Weight:   Wt Readings from Last 1 Encounters:  02/20/2019 61.8 kg   Ideal Body Weight:  54.5 kg  BMI:  Body mass index is 23.39 kg/m.  Estimated Nutritional Needs:   Kcal:  1600-1800  Protein:  80-90 grams  Fluid:  UOP + 1 L  Willey Blade, MS, RD, LDN Office: [phone] Pager: [phone] After Hours/Weekend Pager: [phone]

## 2019-02-16 NOTE — Op Note (Signed)
  OPERATIVE NOTE   PROCEDURE: 1. Removal of a right IJ tunneled dialysis catheter  PRE-OPERATIVE DIAGNOSIS: Complication of dialysis catheter, End stage renal disease  POST-OPERATIVE DIAGNOSIS: Same  SURGEON: Hortencia Pilar, M.D.  ANESTHESIA: Local anesthetic with 1% lidocaine with epinephrine   ESTIMATED BLOOD LOSS: Minimal   FINDING(S): 1. Catheter intact   SPECIMEN(S):  Catheter  INDICATIONS:   Theresa Rogers is a 68 y.o. female who presents with functioning PD catheter and a clotted right IJ catheter.  The patient has undergone placement of a peritoneal access which is working and this has been successfully used without difficulty.  therefore is undergoing removal of his tunneled catheter which is no longer needed to avoid septic complications.   DESCRIPTION: After obtaining full informed written consent, the patient was positioned supine. The right IJ catheter and surrounding area is prepped and draped in a sterile fashion. The cuff was localized by palpation and noted to be less than 3 cm from the exit site. After appropriate timeout is called, 1% lidocaine with epinephrine is infiltrated into the surrounding tissues around the cuff. Small transverse incision is created at the exit site with an 11 blade scalpel and the dissection was carried up along the catheter to expose the cuff of the tunneled catheter.  The catheter cuff is then freed from the surrounding attachments and adhesions. Once the catheter has been freed circumferentially it is removed in 1 piece. Light pressure was held at the base of the neck.   Antibiotic ointment and a sterile dressing is applied to the exit site. Patient tolerated procedure well and there were no complications.  COMPLICATIONS: None  CONDITION: Unchanged  Hortencia Pilar, M.D. [facility] Office: [phone]  02/10/2019,6:22 PM
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
## 2019-02-16 NOTE — Progress Notes (Signed)
Pts MEWS score this morning was a 2 due to BP and temp. We have been having difficulty obtain a temperature oral and axillary this morning and also trouble getting an accurate BP. Low BP and last recorded temp reported to Dr. Posey Pronto. Orders obtained. Will conitue to monitor.

## 2019-02-16 NOTE — Progress Notes (Signed)
[place] at [facility]                                                                                                                                                                                  Patient Demographics   Theresa Rogers, is a 68 y.o. female, DOB - [birth year], [id number]  Admit date - 03/09/2019   Admitting Physician Lang Snow, NP  Outpatient Primary MD for the patient is Marygrace Drought, MD   LOS - 1  Subjective: Patient admitted with generalized weakness and worsening diarrhea.  She reports that she has a history of chronic diarrhea that has worsened.    Review of Systems:   CONSTITUTIONAL: No documented fever.  Positive fatigue, weakness. No weight gain, no weight loss.  EYES: No blurry or double vision.  ENT: No tinnitus. No postnasal drip. No redness of the oropharynx.  RESPIRATORY: No cough, no wheeze, no hemoptysis. No dyspnea.  CARDIOVASCULAR: No chest pain. No orthopnea. No palpitations. No syncope.  GASTROINTESTINAL: No nausea, no vomiting or positive diarrhea. No abdominal pain. No melena or hematochezia.  GENITOURINARY: No dysuria or hematuria.  ENDOCRINE: No polyuria or nocturia. No heat or cold intolerance.  HEMATOLOGY: No anemia. No bruising. No bleeding.  INTEGUMENTARY: No rashes. No lesions.  MUSCULOSKELETAL: No arthritis. No swelling. No gout.  NEUROLOGIC: No numbness, tingling, or ataxia. No seizure-type activity.  PSYCHIATRIC: No anxiety. No insomnia. No ADD.    Vitals:   Vitals:   03/04/2019 1147 03/10/2019 1425 02/17/2019 1515 03/01/2019 1525  BP: 104/84 (!) (P) 87/16 (!) (P) 82/23   Pulse: 66 (!) 41 (!) 58   Resp:  20 17 16   Temp:  97.6 F (36.4 C)    TempSrc:  Oral    SpO2: 96%  98% 97%  Weight:  70.6 kg    Height:  5\' 4"  (1.626 m)      Wt Readings from Last 3 Encounters:  02/11/2019 70.6 kg  12/28/18 64.4 kg  11/21/18 65.1 kg     Intake/Output Summary (Last 24 hours) at 02/23/2019 1534  Last data filed at 02/23/2019 1530 Gross per 24 hour  Intake 1266.9 ml  Output -  Net 1266.9 ml    Physical Exam:   GENERAL: Pleasant-appearing in no apparent distress.  HEAD, EYES, EARS, NOSE AND THROAT: Atraumatic, normocephalic. Extraocular muscles are intact. Pupils equal and reactive to light. Sclerae anicteric. No conjunctival injection. No oro-pharyngeal erythema.  NECK: Supple. There is no jugular venous distention. No bruits, no lymphadenopathy, no thyromegaly.  HEART: Regular rate and rhythm,. No murmurs, no rubs, no clicks.  LUNGS: Clear to auscultation bilaterally. No rales or rhonchi. No wheezes.  ABDOMEN: Soft, flat, nontender, nondistended. Has good bowel sounds. No hepatosplenomegaly appreciated.  EXTREMITIES: No evidence of any cyanosis, clubbing, or peripheral edema.  +2 pedal and radial pulses bilaterally.  NEUROLOGIC: The patient is alert, awake, and oriented x3 with no focal motor or sensory deficits appreciated bilaterally.  SKIN: In the first picture patient has necrotic area below her knee      Psych: Not anxious, depressed LN: No inguinal LN enlargement    Antibiotics   Anti-infectives (From admission, onward)   None      Medications   Scheduled Meds: . [MAR Hold] aspirin EC  81 mg Oral Daily  . [MAR Hold] atorvastatin  80 mg Oral QPM  . [MAR Hold] calcium acetate  667 mg Oral BID WC  . [MAR Hold] dextrose  12.5 g Intravenous STAT  . [MAR Hold] heparin  5,000 Units Subcutaneous Q8H  . [MAR Hold] insulin aspart  0-5 Units Subcutaneous QHS  . [MAR Hold] insulin aspart  0-9 Units Subcutaneous TID WC  . [MAR Hold] loperamide  4 mg Oral Q6H  . [MAR Hold] midodrine  5 mg Oral TID WC  . [MAR Hold] potassium chloride  40 mEq Oral Daily   Continuous Infusions: . [MAR Hold] sodium chloride    . dextrose 5 % and 0.45% NaCl Stopped (02/18/2019 1156)   PRN Meds:.[MAR Hold] HYDROmorphone HCl, [MAR Hold] ondansetron (ZOFRAN) IV, [MAR Hold] traMADol   Data  Review:   Micro Results Recent Results (from the past 240 hour(s))  SARS CORONAVIRUS 2 (TAT 6-24 HRS) Nasopharyngeal Nasopharyngeal Swab     Status: None   Collection Time: 03/09/2019  8:20 PM   Specimen: Nasopharyngeal Swab  Result Value Ref Range Status   SARS Coronavirus 2 NEGATIVE NEGATIVE Final    Comment: (NOTE) SARS-CoV-2 target nucleic acids are NOT DETECTED. The SARS-CoV-2 RNA is generally detectable in upper and lower respiratory specimens during the acute phase of infection. Negative results do not preclude SARS-CoV-2 infection, do not rule out co-infections with other pathogens, and should not be used as the sole basis for treatment or other patient management decisions. Negative results must be combined with clinical observations, patient history, and epidemiological information. The expected result is Negative. Fact Sheet for Patients: SugarRoll.be Fact Sheet for Healthcare Providers: https://www.woods-mathews.com/ This test is not yet approved or cleared by the [place] FDA and  has been authorized for detection and/or diagnosis of SARS-CoV-2 by FDA under an Emergency Use Authorization (EUA). This EUA will remain  in effect (meaning this test can be used) for the duration of the COVID-19 declaration under Section 56 4(b)(1) of the Act, 21 U.S.C. section 360bbb-3(b)(1), unless the authorization is terminated or revoked sooner. Performed at [facility], [place] [address], [place], [place] [address]   Gastrointestinal Panel by PCR , Stool     Status: None   Collection Time: 03/05/2019  7:53 AM   Specimen: Stool  Result Value Ref Range Status   Campylobacter species NOT DETECTED NOT DETECTED Final   Plesimonas shigelloides NOT DETECTED NOT DETECTED Final   Salmonella species NOT DETECTED NOT DETECTED Final   Yersinia enterocolitica NOT DETECTED NOT DETECTED Final   Vibrio species NOT DETECTED NOT DETECTED Final    Vibrio cholerae NOT DETECTED NOT DETECTED Final   Enteroaggregative E coli (EAEC) NOT DETECTED NOT DETECTED Final   Enteropathogenic E coli (EPEC) NOT DETECTED NOT DETECTED Final   Enterotoxigenic E coli (ETEC) NOT DETECTED NOT DETECTED Final   Shiga like  toxin producing E coli (STEC) NOT DETECTED NOT DETECTED Final   Shigella/Enteroinvasive E coli (EIEC) NOT DETECTED NOT DETECTED Final   Cryptosporidium NOT DETECTED NOT DETECTED Final   Cyclospora cayetanensis NOT DETECTED NOT DETECTED Final   Entamoeba histolytica NOT DETECTED NOT DETECTED Final   Giardia lamblia NOT DETECTED NOT DETECTED Final   Adenovirus F40/41 NOT DETECTED NOT DETECTED Final   Astrovirus NOT DETECTED NOT DETECTED Final   Norovirus GI/GII NOT DETECTED NOT DETECTED Final   Rotavirus A NOT DETECTED NOT DETECTED Final   Sapovirus (I, II, IV, and V) NOT DETECTED NOT DETECTED Final    Comment: Performed at [facility], [address] [place], [place] [address]  C Difficile Quick Screen w PCR reflex     Status: Abnormal   Collection Time: 02/17/2019  7:53 AM   Specimen: STOOL  Result Value Ref Range Status   C Diff antigen POSITIVE (A) NEGATIVE Final   C Diff toxin NEGATIVE NEGATIVE Final   C Diff interpretation Results are indeterminate. See PCR results.  Final    Comment: Performed at [facility], [address] [place], [place] [address]  C. Diff by PCR, Reflexed     Status: Abnormal   Collection Time: 03/06/2019  7:53 AM  Result Value Ref Range Status   Toxigenic C. Difficile by PCR POSITIVE (A) NEGATIVE Final    Comment: Positive for toxigenic C. difficile with little to no toxin production. Only treat if clinical presentation suggests symptomatic illness. Performed at [facility], [address], [place], [place] [address]     Radiology Reports Dg Chest [place] 1 View  Result Date: 02/25/2019 CLINICAL DATA:  Initial evaluation for acute cough. EXAM: PORTABLE CHEST 1 VIEW COMPARISON:   Prior radiograph from 12/28/2018. FINDINGS: Median sternotomy wires underlying CABG markers and surgical clips, stable. Cardiomegaly unchanged. Mediastinal silhouette within normal limits. Right-sided hemodialysis catheter in place with tip overlying the cavoatrial junction. Lungs are hypoinflated. Small to moderate right pleural effusion with associated right basilar opacity, likely atelectasis, similar to previous. Streaky left basilar atelectatic changes also similar. Mild perihilar vascular congestion without pulmonary edema. No other new focal infiltrates. No pneumothorax. No acute osseous finding. IMPRESSION: 1. Small to moderate right pleural effusion, similar to previous. 2. Superimposed bibasilar atelectatic changes, also similar. No new focal infiltrates. 3. Cardiomegaly with mild perihilar vascular congestion without overt pulmonary edema. Electronically Signed   By: Jeannine Boga M.D.   On: 03/10/2019 22:22     CBC Recent Labs  Lab 02/14/2019 1459 03/01/2019 0143  WBC 11.3* 9.5  HGB 12.0 11.3*  HCT 35.6* 33.0*  PLT 233 206  MCV 92.0 91.9  MCH 31.0 31.5  MCHC 33.7 34.2  RDW 15.7* 15.9*  LYMPHSABS 0.5*  --   MONOABS 1.1*  --   EOSABS 0.3  --   BASOSABS 0.0  --     Chemistries  Recent Labs  Lab 02/22/2019 1459 02/20/2019 0143  NA 132* 134*  K 2.7* 2.7*  CL 95* 98  CO2 20* 22  GLUCOSE 89 84  BUN 37* 40*  CREATININE 6.78* 6.62*  CALCIUM 6.6* 6.4*  MG 1.9  --   AST 27 25  ALT 22 20  ALKPHOS 342* 338*  BILITOT 0.9 0.9   ------------------------------------------------------------------------------------------------------------------ estimated creatinine clearance is 8 mL/min (A) (by C-G formula based on SCr of 6.62 mg/dL (H)). ------------------------------------------------------------------------------------------------------------------ No results for input(s): HGBA1C in the last 72 hours.  ------------------------------------------------------------------------------------------------------------------ No results for input(s): CHOL, HDL,  LDLCALC, TRIG, CHOLHDL, LDLDIRECT in the last 72 hours. ------------------------------------------------------------------------------------------------------------------ No results for input(s): TSH, T4TOTAL, T3FREE, THYROIDAB in the last 72 hours.  Invalid input(s): FREET3 ------------------------------------------------------------------------------------------------------------------ No results for input(s): VITAMINB12, FOLATE, FERRITIN, TIBC, IRON, RETICCTPCT in the last 72 hours.  Coagulation profile No results for input(s): INR, PROTIME in the last 168 hours.  No results for input(s): DDIMER in the last 72 hours.  Cardiac Enzymes No results for input(s): CKMB, TROPONINI, MYOGLOBIN in the last 168 hours.  Invalid input(s): CK ------------------------------------------------------------------------------------------------------------------ Invalid input(s): POCBNP    Assessment & Plan   68 y.o. female with past medical history of ESRD on peritoneal dialysis, anemia, CAD s/p CABG x3, paroxysmal atrial fibrillation, diabetes mellitus, hypertension, hyperlipidemia, and systolic congestive heart failure presenting to the ED with chief complaints of bilateral lower extremity aching and diarrhea.  1. Hypokalemia -patient presenting with leg cramping.    Continue to replace potassium, worsened due to diarrhea   2.acute on chronic diarrhea - Patient C. difficile antigen is positive however toxin is negative I will ask ID to see to see if patient needs any treatment   3. Chronic systolic congestive heart failure - No evidence of exacerbation - EF 2019 20-25% - Holding Coreg in the setting of hypotension - On dialysis to manage fluids - Daily weights - Strict I's and O's - Low-salt diet  4.  ESRD on PD, was scheduled to have  PermCath removed by vascular on 02/18/2019 - Acute presentation likely due to dehydration - Avoid nephrotoxins - Nephrology consult  5. Paroxysmal atrial fibrillation -not on anticoagulation due to anemia  6. Coronary Artery Disease s/p CABG x 3 - ASA 81mg  PO daily - HTN, HLD, DM control as below  7. HLD  + Goal LDL<100 - Atorvastatin 80mg  PO qhs  8.   Hypotension- Holding Coreg and hydralazine in the setting of hypotension  9. Diabetes mellitus -Hold Lantus tonight due to poor p.o. intake -We will use sliding scale  10. DVT prophylaxis - Heparin SubQ        Code Status Orders  (From admission, onward)         Start     Ordered   02/28/2019 1916  Full code  Continuous     03/01/2019 1915        Code Status History    Date Active Date Inactive Code Status Order ID Comments User Context   12/28/2018 1224 12/30/2018 1946 Full Code [id number]  Loletha Grayer, MD ED   11/20/2018 1138 11/21/2018 1913 Full Code [id number]  Harrie Foreman, MD Inpatient   05/20/2016 1932 06/14/2016 1635 Full Code [id number]  Leanor Kail, PA Inpatient   05/13/2016 1323 05/20/2016 1850 Full Code [id number]  Vaughan Basta, MD Inpatient   07/03/2015 1714 07/05/2015 1543 Full Code [id number]  Fritzi Mandes, MD Inpatient   06/26/2015 1216 06/27/2015 1914 Full Code [id number]  Wellington Hampshire, MD Inpatient   06/22/2015 0926 06/26/2015 1216 Full Code [id number]  Minna Merritts, MD Inpatient   06/19/2015 1135 06/22/2015 0926 Full Code [id number]  Hillary Bow, MD ED   Advance Care Planning Activity           Consults vascular DVT Prophylaxi,heparin  Lab Results  Component Value Date   PLT 206 02/12/2019     Time Spent in minutes   33min Greater than 50% of time spent in care coordination and counseling patient regarding the condition and plan of care.   Dustin Flock M.D on 02/20/2019 at 3:34 PM  Between 7am to 6pm - Pager - [phone]  After 6pm go to [email or web address] -  Proofreader  [employer]   Office  [phone]

## 2019-02-16 NOTE — Progress Notes (Signed)
Notified MD of calcium. Orders placed.

## 2019-02-16 NOTE — Consult Note (Addendum)
[facility] Nurse wound consult note Consultation was completed by review of records and assistance from the bedside nurse/clinical staff.   Reason for Consult: abrasion thigh and toe Concern related to diabetes on the thigh, would not likely be related to diabetes in this area.  Wound type: partial thickness Pressure Injury POA: NA Dressing procedure/placement/frequency: Added silicone foam to protect the areas based on routine skin care order set.  [place] nurse to assess wounds when back on the [facility] campus in the morning  [place], [place], [place]   1054; imaged added by provider; actually appears to be left malleolus and left thigh. No image of the 5th toe wound; Thigh: full thickness; 100% eschar; will continue with foam until I can see this in person. Unclear etiology; does not appear to have any surrounding erythema at the thigh wound Left malleolar: full thickness; 100% eschar, will continue foam until I can see this in person.   [place], [place], [place]

## 2019-02-16 NOTE — Progress Notes (Signed)
Due to patient current condition and move to ICU, peritoneal dialysis will not be performed tonight, per Dr. Juleen China. Will resume treatment in the AM, HD nurse will be available to monitor more closely

## 2019-02-16 NOTE — Progress Notes (Addendum)
Patient ID: Theresa Rogers, female   DOB: 18-Jul-1950, 68 y.o.   MRN: [id number] rapid response was called given patient's hypertension. Patient is awake alert denies any complaints. She is admitted with acute on chronic diarrhea and hypokalemia. She has history of end-stage renal disease on peritoneal dialysis. Patient's blood pressure medicine has been hold since morning. Blood pressure during my evaluation was 77/29 with map of 39. Given her systolic congestive heart failure 500 normal saline bolus was ordered. Patient will transfer to step down for close monitoring in case she needs IV pressers. Spoke with Dr. Jobie Quaker ICU attending.  Chris with patient and she is agreeable with plan. Discharge discussed with charge nurse and patient's nurse Ria Comment also.  TIME 20 MINS

## 2019-02-17 ENCOUNTER — Inpatient Hospital Stay: Payer: Medicare Other

## 2019-02-17 ENCOUNTER — Inpatient Hospital Stay (HOSPITAL_COMMUNITY)
Admit: 2019-02-17 | Discharge: 2019-02-17 | Disposition: A | Discharge status: Home / Self Care | Payer: Medicare Other | Attending: Pulmonary Disease | Admitting: Pulmonary Disease

## 2019-02-17 ENCOUNTER — Encounter: Payer: Self-pay | Admitting: Vascular Surgery

## 2019-02-17 ENCOUNTER — Other Ambulatory Visit (INDEPENDENT_AMBULATORY_CARE_PROVIDER_SITE_OTHER): Payer: Self-pay | Admitting: Nurse Practitioner

## 2019-02-17 DIAGNOSIS — I340 Nonrheumatic mitral (valve) insufficiency: Secondary | ICD-10-CM

## 2019-02-17 DIAGNOSIS — R571 Hypovolemic shock: Secondary | ICD-10-CM

## 2019-02-17 DIAGNOSIS — R0603 Acute respiratory distress: Secondary | ICD-10-CM

## 2019-02-17 DIAGNOSIS — A0472 Enterocolitis due to Clostridium difficile, not specified as recurrent: Principal | ICD-10-CM

## 2019-02-17 DIAGNOSIS — I420 Dilated cardiomyopathy: Secondary | ICD-10-CM

## 2019-02-17 DIAGNOSIS — I361 Nonrheumatic tricuspid (valve) insufficiency: Secondary | ICD-10-CM

## 2019-02-17 LAB — CBC WITH DIFFERENTIAL/PLATELET
Abs Immature Granulocytes: 0.11 10*3/uL — ABNORMAL HIGH (ref 0.00–0.07)
Basophils Absolute: 0 10*3/uL (ref 0.0–0.1)
Basophils Relative: 0 %
Eosinophils Absolute: 0.2 10*3/uL (ref 0.0–0.5)
Eosinophils Relative: 2 %
HCT: 39.3 % (ref 36.0–46.0)
Hemoglobin: 12.8 g/dL (ref 12.0–15.0)
Immature Granulocytes: 1 %
Lymphocytes Relative: 4 %
Lymphs Abs: 0.5 10*3/uL — ABNORMAL LOW (ref 0.7–4.0)
MCH: 31 pg (ref 26.0–34.0)
MCHC: 32.6 g/dL (ref 30.0–36.0)
MCV: 95.2 fL (ref 80.0–100.0)
Monocytes Absolute: 1.1 10*3/uL — ABNORMAL HIGH (ref 0.1–1.0)
Monocytes Relative: 10 %
Neutro Abs: 9.7 10*3/uL — ABNORMAL HIGH (ref 1.7–7.7)
Neutrophils Relative %: 83 %
Platelets: 271 10*3/uL (ref 150–400)
RBC: 4.13 MIL/uL (ref 3.87–5.11)
RDW: 16.1 % — ABNORMAL HIGH (ref 11.5–15.5)
WBC: 11.7 10*3/uL — ABNORMAL HIGH (ref 4.0–10.5)
nRBC: 0 % (ref 0.0–0.2)

## 2019-02-17 LAB — GLUCOSE, CAPILLARY
Glucose-Capillary: 194 mg/dL — ABNORMAL HIGH (ref 70–99)
Glucose-Capillary: 161 mg/dL — ABNORMAL HIGH (ref 70–99)
Glucose-Capillary: 184 mg/dL — ABNORMAL HIGH (ref 70–99)
Glucose-Capillary: 198 mg/dL — ABNORMAL HIGH (ref 70–99)
Glucose-Capillary: 169 mg/dL — ABNORMAL HIGH (ref 70–99)

## 2019-02-17 LAB — RENAL FUNCTION PANEL
Albumin: 2.3 g/dL — ABNORMAL LOW (ref 3.5–5.0)
Anion gap: 15 (ref 5–15)
BUN: 44 mg/dL — ABNORMAL HIGH (ref 8–23)
CO2: 19 mmol/L — ABNORMAL LOW (ref 22–32)
Calcium: 6.6 mg/dL — ABNORMAL LOW (ref 8.9–10.3)
Chloride: 98 mmol/L (ref 98–111)
Creatinine, Ser: 7.31 mg/dL — ABNORMAL HIGH (ref 0.44–1.00)
GFR calc Af Amer: 6 mL/min — ABNORMAL LOW (ref >60)
GFR calc non Af Amer: 5 mL/min — ABNORMAL LOW (ref >60)
Glucose, Bld: 209 mg/dL — ABNORMAL HIGH (ref 70–99)
Phosphorus: 7.8 mg/dL — ABNORMAL HIGH (ref 2.5–4.6)
Potassium: 3.7 mmol/L (ref 3.5–5.1)
Sodium: 132 mmol/L — ABNORMAL LOW (ref 135–145)

## 2019-02-17 LAB — PHOSPHORUS: Phosphorus: 7.5 mg/dL — ABNORMAL HIGH (ref 2.5–4.6)

## 2019-02-17 LAB — ECHOCARDIOGRAM COMPLETE
Height: 64 in
Weight: 2179.91 oz

## 2019-02-17 LAB — PROCALCITONIN: Procalcitonin: 0.18 ng/mL

## 2019-02-17 LAB — BASIC METABOLIC PANEL
Anion gap: 14 (ref 5–15)
BUN: 44 mg/dL — ABNORMAL HIGH (ref 8–23)
CO2: 17 mmol/L — ABNORMAL LOW (ref 22–32)
Calcium: 6 mg/dL — CL (ref 8.9–10.3)
Chloride: 103 mmol/L (ref 98–111)
Creatinine, Ser: 6.87 mg/dL — ABNORMAL HIGH (ref 0.44–1.00)
GFR calc Af Amer: 7 mL/min — ABNORMAL LOW (ref >60)
GFR calc non Af Amer: 6 mL/min — ABNORMAL LOW (ref >60)
Glucose, Bld: 176 mg/dL — ABNORMAL HIGH (ref 70–99)
Potassium: 3.8 mmol/L (ref 3.5–5.1)
Sodium: 134 mmol/L — ABNORMAL LOW (ref 135–145)

## 2019-02-17 LAB — TSH: TSH: 18.526 u[IU]/mL — ABNORMAL HIGH (ref 0.350–4.500)

## 2019-02-17 LAB — SEDIMENTATION RATE: Sed Rate: 17 mm/hr (ref 0–30)

## 2019-02-17 MED ORDER — ATROPINE SULFATE 1 MG/10ML IJ SOSY
PREFILLED_SYRINGE | INTRAMUSCULAR | Status: AC
  Administered 2019-02-17: 08:00:00 1 mg via INTRAVENOUS
  Filled 2019-02-17: qty 10

## 2019-02-17 MED ORDER — ATROPINE SULFATE 1 MG/10ML IJ SOSY
1.0000 mg | PREFILLED_SYRINGE | Freq: Once | INTRAMUSCULAR | Status: AC
  Administered 2019-02-17: 08:00:00 1 mg via INTRAVENOUS

## 2019-02-17 MED ORDER — DOPAMINE-DEXTROSE 3.2-5 MG/ML-% IV SOLN
0.0000 ug/kg/min | INTRAVENOUS | Status: DC
  Administered 2019-02-17: 08:00:00 5 ug/kg/min via INTRAVENOUS

## 2019-02-17 MED ORDER — DOPAMINE-DEXTROSE 3.2-5 MG/ML-% IV SOLN
0.0000 ug/kg/min | INTRAVENOUS | Status: DC
  Administered 2019-02-17 – 2019-02-19 (×2): 5 ug/kg/min via INTRAVENOUS
  Filled 2019-02-17: qty 250

## 2019-02-17 MED ORDER — COLLAGENASE 250 UNIT/GM EX OINT
TOPICAL_OINTMENT | Freq: Every day | CUTANEOUS | Status: DC
  Administered 2019-02-17 – 2019-02-20 (×4): via TOPICAL
  Filled 2019-02-17: qty 30

## 2019-02-17 MED ORDER — DELFLEX-LC/1.5% DEXTROSE 344 MOSM/L IP SOLN
INTRAPERITONEAL | Status: DC
  Administered 2019-02-17: 6000 mL via INTRAPERITONEAL
  Administered 2019-02-18 – 2019-02-20 (×2): 6 L via INTRAPERITONEAL
  Filled 2019-02-17: qty 3000

## 2019-02-17 MED ORDER — GENTAMICIN SULFATE 0.1 % EX CREA
1.0000 "application " | TOPICAL_CREAM | Freq: Every day | CUTANEOUS | Status: DC
  Administered 2019-02-17 – 2019-02-20 (×5): 1 via TOPICAL
  Filled 2019-02-17: qty 15

## 2019-02-17 MED ORDER — MORPHINE SULFATE (PF) 2 MG/ML IV SOLN
2.0000 mg | Freq: Once | INTRAVENOUS | Status: AC
  Administered 2019-02-17: 17:00:00 2 mg via INTRAVENOUS
  Filled 2019-02-17: qty 1

## 2019-02-17 MED ORDER — DOPAMINE-DEXTROSE 3.2-5 MG/ML-% IV SOLN
INTRAVENOUS | Status: AC
  Administered 2019-02-17: 5 ug/kg/min via INTRAVENOUS
  Filled 2019-02-17: qty 250

## 2019-02-17 NOTE — Consult Note (Signed)
[place] Nurse wound follow up Wound type: full thickness ulcerations LLE  1. Left malleolar; self reported trauma with "frying pan" 2. Left posterior calf; no clear etiology; full thickness. Patient does report sitting in walker with seat for long periods of time.  Would be a point of pressure or trauma with the hard seats of the roller walker seat.  Measurement:  1. 0.3cm x 0.4cm x 0cm  2. 4cm x 3cm x 0.1cm  Wound bed: both areas are 100% black  Drainage (amount, consistency, odor) minimal from each site; no odor; serosanguinous  Periwound: intact  Dressing procedure/placement/frequency: Add enzymatic debridement ointment to the left malleolar and left posterior calf wounds, moist gauze dressing. kerlix or tape. Change daily.   Discussed POC with patient and bedside nurse.  Re consult if needed, will not follow at this time. Thanks  Xaidyn Kepner [employer], RN,CWOCN, CNS, [place] [phone])

## 2019-02-17 NOTE — Progress Notes (Signed)
Pre CCPD Assessmenbt    02/17/19 1058  Neurological  Level of Consciousness Alert  Orientation Level Oriented X4  Respiratory  Respiratory Pattern Regular;Unlabored  Chest Assessment Chest expansion symmetrical  Bilateral Breath Sounds Clear;Diminished  Cardiac  Pulse Regular  Heart Sounds S1, S2  ECG Monitor Yes  Cardiac Rhythm NSR  Vascular  R Radial Pulse +2  L Radial Pulse +2

## 2019-02-17 NOTE — Procedures (Signed)
Arterial Catheter Insertion Procedure Note Theresa Legro [id number] [birth year]  Procedure: Insertion of Arterial Catheter  Indications: Blood pressure monitoring and Frequent blood sampling  Procedure Details Consent: Risks of procedure as well as the alternatives and risks of each were explained to the (patient/caregiver).  Consent for procedure obtained. Time Out: Verified patient identification, verified procedure, site/side was marked, verified correct patient position, special equipment/implants available, medications/allergies/relevent history reviewed, required imaging and test results available.  Performed  Maximum sterile technique was used including antiseptics, cap, gloves, gown, hand hygiene, mask and sheet. Skin prep: Chlorhexidine; local anesthetic administered 20 gauge catheter was inserted into right femoral artery using the Seldinger technique. ULTRASOUND GUIDANCE USED: YES Evaluation Blood flow good; BP tracing good. Complications: No apparent complications.   Procedure was performed using Ultrasound for direct visualization of cannulization of Right Femoral Artery.    Theresa Rogers, AGACNP-BC [facility] Pager: [phone] Cell: [phone]  Bradly Bienenstock 02/17/2019

## 2019-02-17 NOTE — Progress Notes (Signed)
[place] at [facility]                                                                                                                                                                                  Patient Demographics   Theresa Rogers, is a 68 y.o. female, DOB - 1951/04/13, [id number]  Admit date - 02/13/2019   Admitting Physician Lang Snow, NP  Outpatient Primary MD for the patient is Marygrace Drought, MD   LOS - 2  Subjective: Patient states that diarrhea is improved on dopamine    Review of Systems:   CONSTITUTIONAL: No documented fever.  Positive fatigue, weakness. No weight gain, no weight loss.  EYES: No blurry or double vision.  ENT: No tinnitus. No postnasal drip. No redness of the oropharynx.  RESPIRATORY: No cough, no wheeze, no hemoptysis. No dyspnea.  CARDIOVASCULAR: No chest pain. No orthopnea. No palpitations. No syncope.  GASTROINTESTINAL: No nausea, no vomiting or positive diarrhea. No abdominal pain. No melena or hematochezia.  GENITOURINARY: No dysuria or hematuria.  ENDOCRINE: No polyuria or nocturia. No heat or cold intolerance.  HEMATOLOGY: No anemia. No bruising. No bleeding.  INTEGUMENTARY: No rashes. No lesions.  MUSCULOSKELETAL: No arthritis. No swelling. No gout.  NEUROLOGIC: No numbness, tingling, or ataxia. No seizure-type activity.  PSYCHIATRIC: No anxiety. No insomnia. No ADD.    Vitals:   Vitals:   02/17/19 1300 02/17/19 1330 02/17/19 1400 02/17/19 1430  BP:      Pulse: 84 77 77 80  Resp: (!) 21 18 15 10   Temp:   97.9 F (36.6 C)   TempSrc:   Oral   SpO2: 94% 91% (!) 87% 90%  Weight:      Height:        Wt Readings from Last 3 Encounters:  02/09/2019 61.8 kg  12/28/18 64.4 kg  11/21/18 65.1 kg     Intake/Output Summary (Last 24 hours) at 02/17/2019 1442 Last data filed at 02/17/2019 1141 Gross per 24 hour  Intake 10130.9 ml  Output -  Net 10130.9 ml    Physical Exam:   GENERAL:  Pleasant-appearing in no apparent distress.  HEAD, EYES, EARS, NOSE AND THROAT: Atraumatic, normocephalic. Extraocular muscles are intact. Pupils equal and reactive to light. Sclerae anicteric. No conjunctival injection. No oro-pharyngeal erythema.  NECK: Supple. There is no jugular venous distention. No bruits, no lymphadenopathy, no thyromegaly.  HEART: Regular rate and rhythm,. No murmurs, no rubs, no clicks.  LUNGS: Clear to auscultation bilaterally. No rales or rhonchi. No wheezes.  ABDOMEN: Soft, flat, nontender, nondistended. Has good bowel sounds. No hepatosplenomegaly appreciated.  EXTREMITIES: No evidence of any cyanosis, clubbing, or  peripheral edema.  +2 pedal and radial pulses bilaterally.  NEUROLOGIC: The patient is alert, awake, and oriented x3 with no focal motor or sensory deficits appreciated bilaterally.  SKIN: In the first picture patient has necrotic area below her knee      Psych: Not anxious, depressed LN: No inguinal LN enlargement    Antibiotics   Anti-infectives (From admission, onward)   Start     Dose/Rate Route Frequency Ordered Stop   02/15/2019 2200  vancomycin (VANCOCIN) 50 mg/mL oral solution 125 mg     125 mg Oral 4 times daily 02/23/2019 2130 02/26/19 2159      Medications   Scheduled Meds: . aspirin EC  81 mg Oral Daily  . atorvastatin  80 mg Oral QPM  . calcium acetate  667 mg Oral BID WC  . Chlorhexidine Gluconate Cloth  6 each Topical Daily  . collagenase   Topical Daily  . feeding supplement (NEPRO CARB STEADY)  237 mL Oral BID BM  . gentamicin cream  1 application Topical Daily  . heparin  5,000 Units Subcutaneous Q8H  . insulin aspart  0-5 Units Subcutaneous QHS  . insulin aspart  0-9 Units Subcutaneous TID WC  . midodrine  5 mg Oral TID WC  . multivitamin  1 tablet Oral QHS  . potassium chloride  40 mEq Oral Daily  . vancomycin  125 mg Oral QID   Continuous Infusions: . sodium chloride 50 mL/hr at 02/17/19 1141  . dialysis solution  1.5% low-MG/low-CA    . DOPamine 5 mcg/kg/min (02/17/19 1141)  . phenylephrine (NEO-SYNEPHRINE) Adult infusion Stopped (02/17/19 0734)   PRN Meds:.HYDROmorphone HCl, ondansetron (ZOFRAN) IV, traMADol   Data Review:   Micro Results Recent Results (from the past 240 hour(s))  SARS CORONAVIRUS 2 (TAT 6-24 HRS) Nasopharyngeal Nasopharyngeal Swab     Status: None   Collection Time: 03/07/2019  8:20 PM   Specimen: Nasopharyngeal Swab  Result Value Ref Range Status   SARS Coronavirus 2 NEGATIVE NEGATIVE Final    Comment: (NOTE) SARS-CoV-2 target nucleic acids are NOT DETECTED. The SARS-CoV-2 RNA is generally detectable in upper and lower respiratory specimens during the acute phase of infection. Negative results do not preclude SARS-CoV-2 infection, do not rule out co-infections with other pathogens, and should not be used as the sole basis for treatment or other patient management decisions. Negative results must be combined with clinical observations, patient history, and epidemiological information. The expected result is Negative. Fact Sheet for Patients: SugarRoll.be Fact Sheet for Healthcare Providers: https://www.woods-mathews.com/ This test is not yet approved or cleared by the [place] FDA and  has been authorized for detection and/or diagnosis of SARS-CoV-2 by FDA under an Emergency Use Authorization (EUA). This EUA will remain  in effect (meaning this test can be used) for the duration of the COVID-19 declaration under Section 56 4(b)(1) of the Act, 21 U.S.C. section 360bbb-3(b)(1), unless the authorization is terminated or revoked sooner. Performed at [facility], [place] [address] [place], [place] [address]   Gastrointestinal Panel by PCR , Stool     Status: None   Collection Time: 03/02/2019  7:53 AM   Specimen: Stool  Result Value Ref Range Status   Campylobacter species NOT DETECTED NOT DETECTED Final   Plesimonas  shigelloides NOT DETECTED NOT DETECTED Final   Salmonella species NOT DETECTED NOT DETECTED Final   Yersinia enterocolitica NOT DETECTED NOT DETECTED Final   Vibrio species NOT DETECTED NOT DETECTED Final   Vibrio cholerae  NOT DETECTED NOT DETECTED Final   Enteroaggregative E coli (EAEC) NOT DETECTED NOT DETECTED Final   Enteropathogenic E coli (EPEC) NOT DETECTED NOT DETECTED Final   Enterotoxigenic E coli (ETEC) NOT DETECTED NOT DETECTED Final   Shiga like toxin producing E coli (STEC) NOT DETECTED NOT DETECTED Final   Shigella/Enteroinvasive E coli (EIEC) NOT DETECTED NOT DETECTED Final   Cryptosporidium NOT DETECTED NOT DETECTED Final   Cyclospora cayetanensis NOT DETECTED NOT DETECTED Final   Entamoeba histolytica NOT DETECTED NOT DETECTED Final   Giardia lamblia NOT DETECTED NOT DETECTED Final   Adenovirus F40/41 NOT DETECTED NOT DETECTED Final   Astrovirus NOT DETECTED NOT DETECTED Final   Norovirus GI/GII NOT DETECTED NOT DETECTED Final   Rotavirus A NOT DETECTED NOT DETECTED Final   Sapovirus (I, II, IV, and V) NOT DETECTED NOT DETECTED Final    Comment: Performed at [facility], [address] [place], [place] [address]  C Difficile Quick Screen w PCR reflex     Status: Abnormal   Collection Time: 02/09/2019  7:53 AM   Specimen: STOOL  Result Value Ref Range Status   C Diff antigen POSITIVE (A) NEGATIVE Final   C Diff toxin NEGATIVE NEGATIVE Final   C Diff interpretation Results are indeterminate. See PCR results.  Final    Comment: Performed at [facility], [address] [place], [place] [address]  C. Diff by PCR, Reflexed     Status: Abnormal   Collection Time: 02/17/2019  7:53 AM  Result Value Ref Range Status   Toxigenic C. Difficile by PCR POSITIVE (A) NEGATIVE Final    Comment: Positive for toxigenic C. difficile with little to no toxin production. Only treat if clinical presentation suggests symptomatic illness. Performed at [facility], [address] [place], [place] [address]   MRSA PCR Screening     Status: None   Collection Time: 03/05/2019  6:12 PM   Specimen: Nasopharyngeal  Result Value Ref Range Status   MRSA by PCR NEGATIVE NEGATIVE Final    Comment:        The GeneXpert MRSA Assay (FDA approved for NASAL specimens only), is one component of a comprehensive MRSA colonization surveillance program. It is not intended to diagnose MRSA infection nor to guide or monitor treatment for MRSA infections. Performed at [facility], [address] [place], [place] [address]   CULTURE, BLOOD (ROUTINE X 2) w Reflex to ID Panel     Status: None (Preliminary result)   Collection Time: 02/17/2019 11:26 PM   Specimen: BLOOD  Result Value Ref Range Status   Specimen Description BLOOD BLOOD RIGHT FOREARM  Final   Special Requests   Final    BOTTLES DRAWN AEROBIC ONLY Blood Culture adequate volume   Culture   Final    NO GROWTH < 12 HOURS Performed at [facility], [address] [place], [place] [address]    Report Status PENDING  Incomplete  CULTURE, BLOOD (ROUTINE X 2) w Reflex to ID Panel     Status: None (Preliminary result)   Collection Time: 02/22/2019 11:27 PM   Specimen: BLOOD  Result Value Ref Range Status   Specimen Description BLOOD RAC  Final   Special Requests   Final    BOTTLES DRAWN AEROBIC ONLY Blood Culture adequate volume   Culture   Final    NO GROWTH < 12 HOURS Performed at [facility], [address], [place],  [address]  Report Status PENDING  Incomplete    Radiology Reports Dg Chest Port 1 View  Result Date: 02/17/2019 CLINICAL DATA:  Shortness of breath. EXAM: PORTABLE CHEST 1 VIEW COMPARISON:  February 15, 2019 FINDINGS: Stable postsurgical changes from CABG. Enlarged cardiac silhouette. Moderate right pleural effusion. Bilateral lower lung fields streaky opacities which likely represent atelectasis. Airspace consolidation the right lung base  cannot be excluded. Osseous structures are without acute abnormality. Soft tissues are grossly normal. IMPRESSION: 1. Moderate right pleural effusion. 2. Left lower lung field streaky opacities likely represent atelectasis. 3. Moderate right pleural effusion. Atelectasis versus airspace consolidation in the right lung base. Electronically Signed   By: Fidela Salisbury M.D.   On: 02/17/2019 10:32   Dg Chest Port 1 View  Result Date: 02/13/2019 CLINICAL DATA:  Initial evaluation for acute cough. EXAM: PORTABLE CHEST 1 VIEW COMPARISON:  Prior radiograph from 12/28/2018. FINDINGS: Median sternotomy wires underlying CABG markers and surgical clips, stable. Cardiomegaly unchanged. Mediastinal silhouette within normal limits. Right-sided hemodialysis catheter in place with tip overlying the cavoatrial junction. Lungs are hypoinflated. Small to moderate right pleural effusion with associated right basilar opacity, likely atelectasis, similar to previous. Streaky left basilar atelectatic changes also similar. Mild perihilar vascular congestion without pulmonary edema. No other new focal infiltrates. No pneumothorax. No acute osseous finding. IMPRESSION: 1. Small to moderate right pleural effusion, similar to previous. 2. Superimposed bibasilar atelectatic changes, also similar. No new focal infiltrates. 3. Cardiomegaly with mild perihilar vascular congestion without overt pulmonary edema. Electronically Signed   By: Jeannine Boga M.D.   On: 03/02/2019 22:22     CBC Recent Labs  Lab 02/14/2019 1459 02/28/2019 0143 02/14/2019 2324  WBC 11.3* 9.5 11.7*  HGB 12.0 11.3* 12.8  HCT 35.6* 33.0* 39.3  PLT 233 206 271  MCV 92.0 91.9 95.2  MCH 31.0 31.5 31.0  MCHC 33.7 34.2 32.6  RDW 15.7* 15.9* 16.1*  LYMPHSABS 0.5*  --  0.5*  MONOABS 1.1*  --  1.1*  EOSABS 0.3  --  0.2  BASOSABS 0.0  --  0.0    Chemistries  Recent Labs  Lab 02/26/2019 1459 02/12/2019 0143 02/18/2019 2324 02/17/19 0518  NA 132* 134* 132*  134*  K 2.7* 2.7* 3.7 3.8  CL 95* 98 98 103  CO2 20* 22 19* 17*  GLUCOSE 89 84 209* 176*  BUN 37* 40* 44* 44*  CREATININE 6.78* 6.62* 7.31* 6.87*  CALCIUM 6.6* 6.4* 6.6* 6.0*  MG 1.9  --   --   --   AST 27 25  --   --   ALT 22 20  --   --   ALKPHOS 342* 338*  --   --   BILITOT 0.9 0.9  --   --    ------------------------------------------------------------------------------------------------------------------ estimated creatinine clearance is 6.9 mL/min (A) (by C-G formula based on SCr of 6.87 mg/dL (H)). ------------------------------------------------------------------------------------------------------------------ No results for input(s): HGBA1C in the last 72 hours. ------------------------------------------------------------------------------------------------------------------ No results for input(s): CHOL, HDL, LDLCALC, TRIG, CHOLHDL, LDLDIRECT in the last 72 hours. ------------------------------------------------------------------------------------------------------------------ Recent Labs    02/17/19 0518  TSH 18.526*   ------------------------------------------------------------------------------------------------------------------ No results for input(s): VITAMINB12, FOLATE, FERRITIN, TIBC, IRON, RETICCTPCT in the last 72 hours.  Coagulation profile No results for input(s): INR, PROTIME in the last 168 hours.  No results for input(s): DDIMER in the last 72 hours.  Cardiac Enzymes No results for input(s): CKMB, TROPONINI, MYOGLOBIN in the last 168 hours.  Invalid input(s): CK ------------------------------------------------------------------------------------------------------------------ Invalid input(s): POCBNP  Assessment & Plan   67 y.o. female with past medical history of ESRD on peritoneal dialysis, anemia, CAD s/p CABG x3, paroxysmal atrial fibrillation, diabetes mellitus, hypertension, hyperlipidemia, and systolic congestive heart failure presenting  to the ED with chief complaints of bilateral lower extremity aching and diarrhea.  1. Hypokalemia -patient presenting with leg cramping.    Patient's potassium is now normal  2. Marland Kitchenacute on chronic diarrhea - Patient being treated for C. difficile at the direction of infectious disease    3. Chronic systolic congestive heart failure - No evidence of exacerbation - EF 2019 20-25% - Holding Coreg in the setting of hypotension - On dialysis to manage fluids - Daily weights - Strict I's and O's - Low-salt diet  4.  ESRD on PD, was scheduled to have PermCath removed   5. Paroxysmal atrial fibrillation -not on anticoagulation due to anemia  6. Coronary Artery Disease s/p CABG x 3 -Continue ASA 81mg  PO daily - HTN, HLD, DM control as below  7. HLD  + Goal LDL<100 - Atorvastatin 80mg  PO qhs  8.   Hypotension- Holding Coreg and hydralazine in the setting of hypotension patient on dopamine  9. Diabetes mellitus -Hold Lantus tonight due to poor p.o. intake -We will use sliding scale  10. DVT prophylaxis - Heparin SubQ        Code Status Orders  (From admission, onward)         Start     Ordered   02/19/2019 1916  Full code  Continuous     03/01/2019 1915        Code Status History    Date Active Date Inactive Code Status Order ID Comments User Context   12/28/2018 1224 12/30/2018 1946 Full Code [id number]  Loletha Grayer, MD ED   11/20/2018 1138 11/21/2018 1913 Full Code [id number]  Harrie Foreman, MD Inpatient   05/20/2016 1932 06/14/2016 1635 Full Code [id number]  Leanor Kail, PA Inpatient   05/13/2016 1323 05/20/2016 1850 Full Code [id number]  Vaughan Basta, MD Inpatient   07/03/2015 1714 07/05/2015 1543 Full Code [id number]  Fritzi Mandes, MD Inpatient   06/26/2015 1216 06/27/2015 1914 Full Code [id number]  Wellington Hampshire, MD Inpatient   06/22/2015 0926 06/26/2015 1216 Full Code [id number]  Minna Merritts, MD Inpatient   06/19/2015 1135 06/22/2015 0926  Full Code [id number]  Hillary Bow, MD ED   Advance Care Planning Activity           Consults vascular DVT Prophylaxi,heparin  Lab Results  Component Value Date   PLT 271 03/05/2019     Time Spent in minutes   49min Greater than 50% of time spent in care coordination and counseling patient regarding the condition and plan of care.   Dustin Flock M.D on 02/17/2019 at 2:42 PM  Between 7am to 6pm - Pager - [phone]  After 6pm go to [email or web address] - Proofreader  [employer]   Office  [phone]

## 2019-02-17 NOTE — Progress Notes (Signed)
*  PRELIMINARY RESULTS* Echocardiogram 2D Echocardiogram has been performed.  Theresa Rogers 02/17/2019, 2:52 PM

## 2019-02-17 NOTE — Plan of Care (Signed)
  Problem: Education: Goal: Knowledge of General Education information will improve Description: Including pain rating scale, medication(s)/side effects and non-pharmacologic comfort measures Outcome: Progressing   Problem: Health Behavior/Discharge Planning: Goal: Ability to manage health-related needs will improve Outcome: Progressing   Problem: Clinical Measurements: Goal: Respiratory complications will improve Outcome: Progressing Goal: Cardiovascular complication will be avoided Outcome: Progressing   Problem: Coping: Goal: Level of anxiety will decrease Outcome: Progressing   Problem: Elimination: Goal: Will not experience complications related to bowel motility Outcome: Progressing Goal: Will not experience complications related to urinary retention Outcome: Progressing   Problem: Pain Managment: Goal: General experience of comfort will improve Outcome: Progressing   Problem: Safety: Goal: Ability to remain free from injury will improve Outcome: Progressing   Problem: Skin Integrity: Goal: Risk for impaired skin integrity will decrease Outcome: Progressing
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
## 2019-02-17 NOTE — Progress Notes (Signed)
[facility]  ROUNDING NOTE   Subjective:   Hypotensive. Transferred to ICU. Placed on vasopressors: dopamine and phenylephrine.   Peritoneal dialysis held.   Bradycardic overnight.   Yesterday had her RIJ permcath removed by Dr. Delana Meyer  Patient reports no more episodes of diarrhea since yesterday.   Objective:  Vital signs in last 24 hours:  Temp:  [97.6 F (36.4 C)-97.7 F (36.5 C)] 97.7 F (36.5 C) (09/08 2159) Pulse Rate:  [41-70] 58 (09/09 0330) Resp:  [10-21] 16 (09/09 0330) BP: (57-108)/(16-84) 86/68 (09/09 0030) SpO2:  [93 %-99 %] 99 % (09/09 0330) Arterial Line BP: (122-132)/(52-57) 122/52 (09/09 0330) Weight:  [61.8 kg-70.6 kg] 61.8 kg (09/08 1808)  Weight change: 10.1 kg Filed Weights   02/12/2019 0112 02/25/2019 1425 03/08/2019 1808  Weight: 70.6 kg 70.6 kg 61.8 kg    Intake/Output: I/O last 3 completed shifts: In: 2739.7 [P.O.:150; I.V.:2024.5; IV Piggyback:565.2] Out: -    Intake/Output this shift:  No intake/output data recorded.  Physical Exam: General: NAD, sitting in bed  Head: Normocephalic, atraumatic. Moist oral mucosal membranes  Eyes: Anicteric, PERRL  Neck: Supple, trachea midline  Lungs:  Clear to auscultation  Heart: bradycardia  Abdomen:  Soft, nontender,   Extremities:  trace peripheral edema. Bilateral lower extremity wounds  Neurologic: Nonfocal, moving all four extremities  Skin: No lesions  Access: left AVF, PD catheter    Basic Metabolic Panel: Recent Labs  Lab 03/10/2019 1459 02/23/2019 0143 03/06/2019 2324 02/17/19 0518  NA 132* 134* 132* 134*  K 2.7* 2.7* 3.7 3.8  CL 95* 98 98 103  CO2 20* 22 19* 17*  GLUCOSE 89 84 209* 176*  BUN 37* 40* 44* 44*  CREATININE 6.78* 6.62* 7.31* 6.87*  CALCIUM 6.6* 6.4* 6.6* 6.0*  MG 1.9  --   --   --   PHOS  --   --  7.8*  --     Liver Function Tests: Recent Labs  Lab 03/08/2019 1459 02/17/2019 0143 02/28/2019 2324  AST 27 25  --   ALT 22 20  --   ALKPHOS 342* 338*  --    BILITOT 0.9 0.9  --   PROT 5.8* 5.2*  --   ALBUMIN 2.3* 2.0* 2.3*   No results for input(s): LIPASE, AMYLASE in the last 168 hours. No results for input(s): AMMONIA in the last 168 hours.  CBC: Recent Labs  Lab 03/05/2019 1459 03/06/2019 0143 02/10/2019 2324  WBC 11.3* 9.5 11.7*  NEUTROABS 9.4*  --  9.7*  HGB 12.0 11.3* 12.8  HCT 35.6* 33.0* 39.3  MCV 92.0 91.9 95.2  PLT 233 206 271    Cardiac Enzymes: No results for input(s): CKTOTAL, CKMB, CKMBINDEX, TROPONINI in the last 168 hours.  BNP: Invalid input(s): POCBNP  CBG: Recent Labs  Lab 02/11/2019 1429 02/12/2019 1615 02/13/2019 1805 03/04/2019 2152 02/17/19 0812  GLUCAP 116* 123* 194* 178* 161*    Microbiology: Results for orders placed or performed during the hospital encounter of 03/10/2019  SARS CORONAVIRUS 2 (TAT 6-24 HRS) Nasopharyngeal Nasopharyngeal Swab     Status: None   Collection Time: 03/09/2019  8:20 PM   Specimen: Nasopharyngeal Swab  Result Value Ref Range Status   SARS Coronavirus 2 NEGATIVE NEGATIVE Final    Comment: (NOTE) SARS-CoV-2 target nucleic acids are NOT DETECTED. The SARS-CoV-2 RNA is generally detectable in upper and lower respiratory specimens during the acute phase of infection. Negative results do not preclude SARS-CoV-2 infection, do not rule out co-infections with  other pathogens, and should not be used as the sole basis for treatment or other patient management decisions. Negative results must be combined with clinical observations, patient history, and epidemiological information. The expected result is Negative. Fact Sheet for Patients: SugarRoll.be Fact Sheet for Healthcare Providers: https://www.woods-mathews.com/ This test is not yet approved or cleared by the [place] FDA and  has been authorized for detection and/or diagnosis of SARS-CoV-2 by FDA under an Emergency Use Authorization (EUA). This EUA will remain  in effect (meaning this  test can be used) for the duration of the COVID-19 declaration under Section 56 4(b)(1) of the Act, 21 U.S.C. section 360bbb-3(b)(1), unless the authorization is terminated or revoked sooner. Performed at [facility], [place] [address], [place], [place] [address]   Gastrointestinal Panel by PCR , Stool     Status: None   Collection Time: 02/27/2019  7:53 AM   Specimen: Stool  Result Value Ref Range Status   Campylobacter species NOT DETECTED NOT DETECTED Final   Plesimonas shigelloides NOT DETECTED NOT DETECTED Final   Salmonella species NOT DETECTED NOT DETECTED Final   Yersinia enterocolitica NOT DETECTED NOT DETECTED Final   Vibrio species NOT DETECTED NOT DETECTED Final   Vibrio cholerae NOT DETECTED NOT DETECTED Final   Enteroaggregative E coli (EAEC) NOT DETECTED NOT DETECTED Final   Enteropathogenic E coli (EPEC) NOT DETECTED NOT DETECTED Final   Enterotoxigenic E coli (ETEC) NOT DETECTED NOT DETECTED Final   Shiga like toxin producing E coli (STEC) NOT DETECTED NOT DETECTED Final   Shigella/Enteroinvasive E coli (EIEC) NOT DETECTED NOT DETECTED Final   Cryptosporidium NOT DETECTED NOT DETECTED Final   Cyclospora cayetanensis NOT DETECTED NOT DETECTED Final   Entamoeba histolytica NOT DETECTED NOT DETECTED Final   Giardia lamblia NOT DETECTED NOT DETECTED Final   Adenovirus F40/41 NOT DETECTED NOT DETECTED Final   Astrovirus NOT DETECTED NOT DETECTED Final   Norovirus GI/GII NOT DETECTED NOT DETECTED Final   Rotavirus A NOT DETECTED NOT DETECTED Final   Sapovirus (I, II, IV, and V) NOT DETECTED NOT DETECTED Final    Comment: Performed at [facility], [address] [place], [place] [address]  C Difficile Quick Screen w PCR reflex     Status: Abnormal   Collection Time: 03/05/2019  7:53 AM   Specimen: STOOL  Result Value Ref Range Status   C Diff antigen POSITIVE (A) NEGATIVE Final   C Diff toxin NEGATIVE NEGATIVE Final   C Diff interpretation Results are  indeterminate. See PCR results.  Final    Comment: Performed at [facility], [address] [place], [place] [address]  C. Diff by PCR, Reflexed     Status: Abnormal   Collection Time: 02/26/2019  7:53 AM  Result Value Ref Range Status   Toxigenic C. Difficile by PCR POSITIVE (A) NEGATIVE Final    Comment: Positive for toxigenic C. difficile with little to no toxin production. Only treat if clinical presentation suggests symptomatic illness. Performed at [facility], [address] [place], [place] [address]   MRSA PCR Screening     Status: None   Collection Time: 02/15/2019  6:12 PM   Specimen: Nasopharyngeal  Result Value Ref Range Status   MRSA by PCR NEGATIVE NEGATIVE Final    Comment:        The GeneXpert MRSA Assay (FDA approved for NASAL specimens only), is one component of a comprehensive MRSA colonization surveillance program. It is not intended to diagnose  MRSA infection nor to guide or monitor treatment for MRSA infections. Performed at [facility], [address] [place], [place] [address]   CULTURE, BLOOD (ROUTINE X 2) w Reflex to ID Panel     Status: None (Preliminary result)   Collection Time: 02/26/2019 11:26 PM   Specimen: BLOOD  Result Value Ref Range Status   Specimen Description BLOOD BLOOD RIGHT FOREARM  Final   Special Requests   Final    BOTTLES DRAWN AEROBIC ONLY Blood Culture adequate volume   Culture   Final    NO GROWTH < 12 HOURS Performed at [facility], [address] [place], [place] [address]    Report Status PENDING  Incomplete  CULTURE, BLOOD (ROUTINE X 2) w Reflex to ID Panel     Status: None (Preliminary result)   Collection Time: 02/12/2019 11:27 PM   Specimen: BLOOD  Result Value Ref Range Status   Specimen Description BLOOD RAC  Final   Special Requests   Final    BOTTLES DRAWN AEROBIC ONLY Blood Culture adequate volume   Culture   Final    NO GROWTH < 12 HOURS Performed at [facility], [address] [place], [place] [address]    Report Status PENDING  Incomplete    Coagulation Studies: No results for input(s): LABPROT, INR in the last 72 hours.  Urinalysis: No results for input(s): COLORURINE, LABSPEC, PHURINE, GLUCOSEU, HGBUR, BILIRUBINUR, KETONESUR, PROTEINUR, UROBILINOGEN, NITRITE, LEUKOCYTESUR in the last 72 hours.  Invalid input(s): APPERANCEUR    Imaging: Dg Chest Port 1 View  Result Date: 02/14/2019 CLINICAL DATA:  Initial evaluation for acute cough. EXAM: PORTABLE CHEST 1 VIEW COMPARISON:  Prior radiograph from 12/28/2018. FINDINGS: Median sternotomy wires underlying CABG markers and surgical clips, stable. Cardiomegaly unchanged. Mediastinal silhouette within normal limits. Right-sided hemodialysis catheter in place with tip overlying the cavoatrial junction. Lungs are hypoinflated. Small to moderate right pleural effusion with associated right basilar opacity, likely atelectasis, similar to previous. Streaky left basilar atelectatic changes also similar. Mild perihilar vascular congestion without pulmonary edema. No other new focal infiltrates. No pneumothorax. No acute osseous finding. IMPRESSION: 1. Small to moderate right pleural effusion, similar to previous. 2. Superimposed bibasilar atelectatic changes, also similar. No new focal infiltrates. 3. Cardiomegaly with mild perihilar vascular congestion without overt pulmonary edema. Electronically Signed   By: Jeannine Boga M.D.   On: 02/15/2019 22:22     Medications:   . sodium chloride 50 mL/hr at 02/17/19 0331  . DOPamine 5 mcg/kg/min (02/17/19 0737)  . phenylephrine (NEO-SYNEPHRINE) Adult infusion 20 mcg/min (02/17/19 0331)   . aspirin EC  81 mg Oral Daily  . atorvastatin  80 mg Oral QPM  . calcium acetate  667 mg Oral BID WC  . Chlorhexidine Gluconate Cloth  6 each Topical Daily  . dextrose  12.5 g Intravenous STAT  . feeding supplement (NEPRO CARB STEADY)  237 mL Oral BID BM  .  heparin  5,000 Units Subcutaneous Q8H  . insulin aspart  0-5 Units Subcutaneous QHS  . insulin aspart  0-9 Units Subcutaneous TID WC  . loperamide  4 mg Oral Q6H  . midodrine  5 mg Oral TID WC  . multivitamin  1 tablet Oral QHS  . potassium chloride  40 mEq Oral Daily  . vancomycin  125 mg Oral QID   HYDROmorphone HCl, ondansetron (ZOFRAN) IV, traMADol  Assessment/ Plan:  Ms. Undrea Ganzer is a 68 y.o. white female with end stage renal disease  on peritoneal dialysis, hypertension, congestive heart failure, coronary artery disease, diabetes mellitus type II  [facility] 61kg Peritoneal dialysis CCPD 9 hours 5 exchanges 2 liter fills and last fill of 556mL.   1. End Stage Renal Disease - Scheduled peritoneal dialysis during the day. Orders prepared.   2. Hypotension: holding home regimen of furosemide, hydralazine and carvedilol.  Requiring vasopressors Secondary to volume losses from diarrhea.   3. Anemia of chronic kidney disease:   - epo as outpatient.   4. Secondary Hyperparathyroidism: with hyperphosphatemia. Outpatient labs on 9/2 phos 8, calcium 6.8 - calcium acetate with meals.   5. Hypokalemia:  - Potassium supplementation.  - Hold furosemide.    LOS: 2 Levern Kalka 9/9/20209:39 AM

## 2019-02-18 DIAGNOSIS — R001 Bradycardia, unspecified: Secondary | ICD-10-CM

## 2019-02-18 DIAGNOSIS — D631 Anemia in chronic kidney disease: Secondary | ICD-10-CM

## 2019-02-18 DIAGNOSIS — R112 Nausea with vomiting, unspecified: Secondary | ICD-10-CM

## 2019-02-18 DIAGNOSIS — L97919 Non-pressure chronic ulcer of unspecified part of right lower leg with unspecified severity: Secondary | ICD-10-CM

## 2019-02-18 DIAGNOSIS — E860 Dehydration: Secondary | ICD-10-CM

## 2019-02-18 DIAGNOSIS — L97929 Non-pressure chronic ulcer of unspecified part of left lower leg with unspecified severity: Secondary | ICD-10-CM

## 2019-02-18 LAB — BASIC METABOLIC PANEL
Anion gap: 17 — ABNORMAL HIGH (ref 5–15)
BUN: 51 mg/dL — ABNORMAL HIGH (ref 8–23)
CO2: 16 mmol/L — ABNORMAL LOW (ref 22–32)
Calcium: 7.1 mg/dL — ABNORMAL LOW (ref 8.9–10.3)
Chloride: 100 mmol/L (ref 98–111)
Creatinine, Ser: 7.33 mg/dL — ABNORMAL HIGH (ref 0.44–1.00)
GFR calc Af Amer: 6 mL/min — ABNORMAL LOW (ref >60)
GFR calc non Af Amer: 5 mL/min — ABNORMAL LOW (ref >60)
Glucose, Bld: 222 mg/dL — ABNORMAL HIGH (ref 70–99)
Potassium: 4.2 mmol/L (ref 3.5–5.1)
Sodium: 133 mmol/L — ABNORMAL LOW (ref 135–145)

## 2019-02-18 LAB — GLUCOSE, CAPILLARY
Glucose-Capillary: 121 mg/dL — ABNORMAL HIGH (ref 70–99)
Glucose-Capillary: 108 mg/dL — ABNORMAL HIGH (ref 70–99)
Glucose-Capillary: 246 mg/dL — ABNORMAL HIGH (ref 70–99)
Glucose-Capillary: 254 mg/dL — ABNORMAL HIGH (ref 70–99)

## 2019-02-18 LAB — T4, FREE: Free T4: 0.59 ng/dL — ABNORMAL LOW (ref 0.61–1.12)

## 2019-02-18 MED ORDER — MIDODRINE HCL 5 MG PO TABS
10.0000 mg | ORAL_TABLET | Freq: Three times a day (TID) | ORAL | Status: DC
  Administered 2019-02-18 – 2019-02-19 (×2): 10 mg via ORAL
  Filled 2019-02-18 (×4): qty 2

## 2019-02-18 MED ORDER — HYDROMORPHONE HCL 1 MG/ML IJ SOLN
0.5000 mg | INTRAMUSCULAR | Status: DC | PRN
  Administered 2019-02-18: 23:00:00 0.5 mg via INTRAVENOUS
  Filled 2019-02-18: qty 1

## 2019-02-18 MED ORDER — PROMETHAZINE HCL 25 MG/ML IJ SOLN
12.5000 mg | Freq: Once | INTRAMUSCULAR | Status: AC
  Administered 2019-02-18: 12.5 mg via INTRAVENOUS
  Filled 2019-02-18: qty 1

## 2019-02-18 MED ORDER — DOBUTAMINE IN D5W 4-5 MG/ML-% IV SOLN
2.5000 ug/kg/min | INTRAVENOUS | Status: DC
  Administered 2019-02-18: 2.5 ug/kg/min via INTRAVENOUS
  Filled 2019-02-18: qty 250

## 2019-02-18 MED ORDER — MIDODRINE HCL 5 MG PO TABS
5.0000 mg | ORAL_TABLET | Freq: Once | ORAL | Status: AC
  Administered 2019-02-18: 13:00:00 5 mg via ORAL
  Filled 2019-02-18: qty 1

## 2019-02-18 NOTE — Progress Notes (Signed)
[facility]                                                                                                                                                                                  Patient Demographics   Theresa Rogers, is a 68 y.o. female, DOB - 1950-11-30, [id number]  Admit date - 02/10/2019   Admitting Physician Lang Snow, NP  Outpatient Primary MD for the patient is Marygrace Drought, MD   LOS - 3  Subjective: Patient states that diarrhea is improved on dopamine    Review of Systems:   CONSTITUTIONAL: No documented fever.  Positive fatigue, weakness. No weight gain, no weight loss.  EYES: No blurry or double vision.  ENT: No tinnitus. No postnasal drip. No redness of the oropharynx.  RESPIRATORY: No cough, no wheeze, no hemoptysis. No dyspnea.  CARDIOVASCULAR: No chest pain. No orthopnea. No palpitations. No syncope.  GASTROINTESTINAL: No nausea, no vomiting or positive diarrhea. No abdominal pain. No melena or hematochezia.  GENITOURINARY: No dysuria or hematuria.  ENDOCRINE: No polyuria or nocturia. No heat or cold intolerance.  HEMATOLOGY: No anemia. No bruising. No bleeding.  INTEGUMENTARY: No rashes. No lesions.  MUSCULOSKELETAL: No arthritis. No swelling. No gout.  NEUROLOGIC: No numbness, tingling, or ataxia. No seizure-type activity.  PSYCHIATRIC: No anxiety. No insomnia. No ADD.    Vitals:   Vitals:   02/18/19 1000 02/18/19 1100 02/18/19 1200 02/18/19 1300  BP:      Pulse: 62 62 (!) 59 (!) 54  Resp: 12 12 15 13   Temp:   97.7 F (36.5 C)   TempSrc:   Oral   SpO2: 95% 93% 93% 95%  Weight:      Height:        Wt Readings from Last 3 Encounters:  02/18/19 66.3 kg  12/28/18 64.4 kg  11/21/18 65.1 kg     Intake/Output Summary (Last 24 hours) at 02/18/2019 1414 Last data filed at 02/17/2019 2100 Gross per 24 hour  Intake 265.91 ml  Output 200 ml  Net 65.91 ml    Physical Exam:   GENERAL:  Pleasant-appearing in no apparent distress.  HEAD, EYES, EARS, NOSE AND THROAT: Atraumatic, normocephalic. Extraocular muscles are intact. Pupils equal and reactive to light. Sclerae anicteric. No conjunctival injection. No oro-pharyngeal erythema.  NECK: Supple. There is no jugular venous distention. No bruits, no lymphadenopathy, no thyromegaly.  HEART: Regular rate and rhythm,. No murmurs, no rubs, no clicks.  LUNGS: Clear to auscultation bilaterally. No rales or rhonchi. No wheezes.  ABDOMEN: Soft, flat, nontender, nondistended. Has good bowel sounds. No hepatosplenomegaly appreciated.  EXTREMITIES: No evidence of any cyanosis, clubbing,  or peripheral edema.  +2 pedal and radial pulses bilaterally.  NEUROLOGIC: The patient is alert, awake, and oriented x3 with no focal motor or sensory deficits appreciated bilaterally.  SKIN:  necrotic area below her knee Psych: Not anxious, depressed LN: No inguinal LN enlargement    Antibiotics   Anti-infectives (From admission, onward)   Start     Dose/Rate Route Frequency Ordered Stop   02/12/2019 2200  vancomycin (VANCOCIN) 50 mg/mL oral solution 125 mg     125 mg Oral 4 times daily 02/28/2019 2130 02/26/19 2159      Medications   Scheduled Meds: . aspirin EC  81 mg Oral Daily  . atorvastatin  80 mg Oral QPM  . calcium acetate  667 mg Oral BID WC  . Chlorhexidine Gluconate Cloth  6 each Topical Daily  . collagenase   Topical Daily  . feeding supplement (NEPRO CARB STEADY)  237 mL Oral BID BM  . gentamicin cream  1 application Topical Daily  . heparin  5,000 Units Subcutaneous Q8H  . insulin aspart  0-5 Units Subcutaneous QHS  . insulin aspart  0-9 Units Subcutaneous TID WC  . midodrine  10 mg Oral TID WC  . multivitamin  1 tablet Oral QHS  . potassium chloride  40 mEq Oral Daily  . vancomycin  125 mg Oral QID   Continuous Infusions: . dialysis solution 1.5% low-MG/low-CA    . DOBUTamine 2.5 mcg/kg/min (02/18/19 1304)  . DOPamine 5  mcg/kg/min (02/17/19 1627)   PRN Meds:.HYDROmorphone HCl, ondansetron (ZOFRAN) IV, traMADol   Data Review:   Micro Results Recent Results (from the past 240 hour(s))  SARS CORONAVIRUS 2 (TAT 6-24 HRS) Nasopharyngeal Nasopharyngeal Swab     Status: None   Collection Time: 03/03/2019  8:20 PM   Specimen: Nasopharyngeal Swab  Result Value Ref Range Status   SARS Coronavirus 2 NEGATIVE NEGATIVE Final    Comment: (NOTE) SARS-CoV-2 target nucleic acids are NOT DETECTED. The SARS-CoV-2 RNA is generally detectable in upper and lower respiratory specimens during the acute phase of infection. Negative results do not preclude SARS-CoV-2 infection, do not rule out co-infections with other pathogens, and should not be used as the sole basis for treatment or other patient management decisions. Negative results must be combined with clinical observations, patient history, and epidemiological information. The expected result is Negative. Fact Sheet for Patients: SugarRoll.be Fact Sheet for Healthcare Providers: https://www.woods-mathews.com/ This test is not yet approved or cleared by the [place] FDA and  has been authorized for detection and/or diagnosis of SARS-CoV-2 by FDA under an Emergency Use Authorization (EUA). This EUA will remain  in effect (meaning this test can be used) for the duration of the COVID-19 declaration under Section 56 4(b)(1) of the Act, 21 U.S.C. section 360bbb-3(b)(1), unless the authorization is terminated or revoked sooner. Performed at [facility], [place] [address], [place], [place] [address]   Gastrointestinal Panel by PCR , Stool     Status: None   Collection Time: 03/03/2019  7:53 AM   Specimen: Stool  Result Value Ref Range Status   Campylobacter species NOT DETECTED NOT DETECTED Final   Plesimonas shigelloides NOT DETECTED NOT DETECTED Final   Salmonella species NOT DETECTED NOT DETECTED Final   Yersinia  enterocolitica NOT DETECTED NOT DETECTED Final   Vibrio species NOT DETECTED NOT DETECTED Final   Vibrio cholerae NOT DETECTED NOT DETECTED Final   Enteroaggregative E coli (EAEC) NOT DETECTED NOT DETECTED Final   Enteropathogenic E  coli (EPEC) NOT DETECTED NOT DETECTED Final   Enterotoxigenic E coli (ETEC) NOT DETECTED NOT DETECTED Final   Shiga like toxin producing E coli (STEC) NOT DETECTED NOT DETECTED Final   Shigella/Enteroinvasive E coli (EIEC) NOT DETECTED NOT DETECTED Final   Cryptosporidium NOT DETECTED NOT DETECTED Final   Cyclospora cayetanensis NOT DETECTED NOT DETECTED Final   Entamoeba histolytica NOT DETECTED NOT DETECTED Final   Giardia lamblia NOT DETECTED NOT DETECTED Final   Adenovirus F40/41 NOT DETECTED NOT DETECTED Final   Astrovirus NOT DETECTED NOT DETECTED Final   Norovirus GI/GII NOT DETECTED NOT DETECTED Final   Rotavirus A NOT DETECTED NOT DETECTED Final   Sapovirus (I, II, IV, and V) NOT DETECTED NOT DETECTED Final    Comment: Performed at [facility], [address] [place], [place] [address]  C Difficile Quick Screen w PCR reflex     Status: Abnormal   Collection Time: 02/23/2019  7:53 AM   Specimen: STOOL  Result Value Ref Range Status   C Diff antigen POSITIVE (A) NEGATIVE Final   C Diff toxin NEGATIVE NEGATIVE Final   C Diff interpretation Results are indeterminate. See PCR results.  Final    Comment: Performed at [facility], [address] [place], [place] [address]  C. Diff by PCR, Reflexed     Status: Abnormal   Collection Time: 02/11/2019  7:53 AM  Result Value Ref Range Status   Toxigenic C. Difficile by PCR POSITIVE (A) NEGATIVE Final    Comment: Positive for toxigenic C. difficile with little to no toxin production. Only treat if clinical presentation suggests symptomatic illness. Performed at [facility], [address] [place], [place] [address]   MRSA PCR Screening     Status: None   Collection Time:  02/14/2019  6:12 PM   Specimen: Nasopharyngeal  Result Value Ref Range Status   MRSA by PCR NEGATIVE NEGATIVE Final    Comment:        The GeneXpert MRSA Assay (FDA approved for NASAL specimens only), is one component of a comprehensive MRSA colonization surveillance program. It is not intended to diagnose MRSA infection nor to guide or monitor treatment for MRSA infections. Performed at [facility], [address] [place], [place] [address]   CULTURE, BLOOD (ROUTINE X 2) w Reflex to ID Panel     Status: None (Preliminary result)   Collection Time: 02/14/2019 11:26 PM   Specimen: BLOOD  Result Value Ref Range Status   Specimen Description BLOOD BLOOD RIGHT FOREARM  Final   Special Requests   Final    BOTTLES DRAWN AEROBIC ONLY Blood Culture adequate volume   Culture   Final    NO GROWTH 2 DAYS Performed at [facility], [address] [place], [place] [address]    Report Status PENDING  Incomplete  CULTURE, BLOOD (ROUTINE X 2) w Reflex to ID Panel     Status: None (Preliminary result)   Collection Time: 03/06/2019 11:27 PM   Specimen: BLOOD  Result Value Ref Range Status   Specimen Description BLOOD RAC  Final   Special Requests   Final    BOTTLES DRAWN AEROBIC ONLY Blood Culture adequate volume   Culture   Final    NO GROWTH 2 DAYS Performed at [facility], [address], [place], [place] [address]    Report Status PENDING  Incomplete    Radiology Reports Dg Chest Port 1 View  Result Date: 02/17/2019 CLINICAL DATA:  68 year old female with hypoxia. EXAM: PORTABLE CHEST 1 VIEW COMPARISON:  Chest radiograph dated 02/17/2019 FINDINGS: Moderate right and small left pleural effusion similar or minimally increased since the earlier radiograph. Associated bibasilar densities may represent atelectasis or infiltrate. There is no pneumothorax. There is cardiomegaly with vascular congestion. Median sternotomy wires and CABG vascular clips. No acute  osseous pathology. IMPRESSION: 1. Moderate right and small left pleural effusions similar or minimally increased since the earlier radiograph. Associated bibasilar densities may represent atelectasis or infiltrate. 2. Cardiomegaly with vascular congestion. Electronically Signed   By: Anner Crete M.D.   On: 02/17/2019 17:33   Dg Chest Port 1 View  Result Date: 02/17/2019 CLINICAL DATA:  Shortness of breath. EXAM: PORTABLE CHEST 1 VIEW COMPARISON:  February 15, 2019 FINDINGS: Stable postsurgical changes from CABG. Enlarged cardiac silhouette. Moderate right pleural effusion. Bilateral lower lung fields streaky opacities which likely represent atelectasis. Airspace consolidation the right lung base cannot be excluded. Osseous structures are without acute abnormality. Soft tissues are grossly normal. IMPRESSION: 1. Moderate right pleural effusion. 2. Left lower lung field streaky opacities likely represent atelectasis. 3. Moderate right pleural effusion. Atelectasis versus airspace consolidation in the right lung base. Electronically Signed   By: Fidela Salisbury M.D.   On: 02/17/2019 10:32   Dg Chest Port 1 View  Result Date: 02/20/2019 CLINICAL DATA:  Initial evaluation for acute cough. EXAM: PORTABLE CHEST 1 VIEW COMPARISON:  Prior radiograph from 12/28/2018. FINDINGS: Median sternotomy wires underlying CABG markers and surgical clips, stable. Cardiomegaly unchanged. Mediastinal silhouette within normal limits. Right-sided hemodialysis catheter in place with tip overlying the cavoatrial junction. Lungs are hypoinflated. Small to moderate right pleural effusion with associated right basilar opacity, likely atelectasis, similar to previous. Streaky left basilar atelectatic changes also similar. Mild perihilar vascular congestion without pulmonary edema. No other new focal infiltrates. No pneumothorax. No acute osseous finding. IMPRESSION: 1. Small to moderate right pleural effusion, similar to previous.  2. Superimposed bibasilar atelectatic changes, also similar. No new focal infiltrates. 3. Cardiomegaly with mild perihilar vascular congestion without overt pulmonary edema. Electronically Signed   By: Jeannine Boga M.D.   On: 02/13/2019 22:22     CBC Recent Labs  Lab 02/23/2019 1459 03/03/2019 0143 02/11/2019 2324  WBC 11.3* 9.5 11.7*  HGB 12.0 11.3* 12.8  HCT 35.6* 33.0* 39.3  PLT 233 206 271  MCV 92.0 91.9 95.2  MCH 31.0 31.5 31.0  MCHC 33.7 34.2 32.6  RDW 15.7* 15.9* 16.1*  LYMPHSABS 0.5*  --  0.5*  MONOABS 1.1*  --  1.1*  EOSABS 0.3  --  0.2  BASOSABS 0.0  --  0.0    Chemistries  Recent Labs  Lab 02/28/2019 1459 02/13/2019 0143 02/10/2019 2324 02/17/19 0518 02/18/19 1250  NA 132* 134* 132* 134* 133*  K 2.7* 2.7* 3.7 3.8 4.2  CL 95* 98 98 103 100  CO2 20* 22 19* 17* 16*  GLUCOSE 89 84 209* 176* 222*  BUN 37* 40* 44* 44* 51*  CREATININE 6.78* 6.62* 7.31* 6.87* 7.33*  CALCIUM 6.6* 6.4* 6.6* 6.0* 7.1*  MG 1.9  --   --   --   --   AST 27 25  --   --   --   ALT 22 20  --   --   --   ALKPHOS 342* 338*  --   --   --   BILITOT 0.9 0.9  --   --   --    ------------------------------------------------------------------------------------------------------------------ estimated creatinine  clearance is 7 mL/min (A) (by C-G formula based on SCr of 7.33 mg/dL (H)). ------------------------------------------------------------------------------------------------------------------ No results for input(s): HGBA1C in the last 72 hours. ------------------------------------------------------------------------------------------------------------------ No results for input(s): CHOL, HDL, LDLCALC, TRIG, CHOLHDL, LDLDIRECT in the last 72 hours. ------------------------------------------------------------------------------------------------------------------ Recent Labs    02/17/19 0518  TSH 18.526*    ------------------------------------------------------------------------------------------------------------------ No results for input(s): VITAMINB12, FOLATE, FERRITIN, TIBC, IRON, RETICCTPCT in the last 72 hours.  Coagulation profile No results for input(s): INR, PROTIME in the last 168 hours.  No results for input(s): DDIMER in the last 72 hours.  Cardiac Enzymes No results for input(s): CKMB, TROPONINI, MYOGLOBIN in the last 168 hours.  Invalid input(s): CK ------------------------------------------------------------------------------------------------------------------ Invalid input(s): POCBNP    Assessment & Plan   68 y.o. female with past medical history of ESRD on peritoneal dialysis, anemia, CAD s/p CABG x3, paroxysmal atrial fibrillation, diabetes mellitus, hypertension, hyperlipidemia, and systolic congestive heart failure presenting to the ED with chief complaints of bilateral lower extremity aching and diarrhea.  1. Hypokalemia  now resolved  2. Marland Kitchenacute on chronic diarrhea - Patient being treated for C. difficile at the direction of infectious disease   3. Chronic systolic congestive heart failure - No evidence of exacerbation - EF 2019 20-25% - Holding Coreg in the setting of hypotension - On dialysis to manage fluids - Daily weights - Strict I's and O's - Low-salt diet  4.  ESRD on PD,  status post PermCath removal   5. Paroxysmal atrial fibrillation -not on anticoagulation due to anemia  6. Coronary Artery Disease s/p CABG x 3 -Continue ASA 81mg  PO daily - HTN, HLD, DM control as below  7. HLD  + Goal LDL<100 - Atorvastatin 80mg  PO qhs  8.   Hypotension-continue dopamine  9. Diabetes mellitus -Hold Lantus tonight due to poor p.o. intake -We will use sliding scale  10. DVT prophylaxis - Heparin SubQ        Code Status Orders  (From admission, onward)         Start     Ordered   02/14/2019 1916  Full code  Continuous      02/18/2019 1915        Code Status History    Date Active Date Inactive Code Status Order ID Comments User Context   12/28/2018 1224 12/30/2018 1946 Full Code [id number]  Loletha Grayer, MD ED   11/20/2018 1138 11/21/2018 1913 Full Code [id number]  Harrie Foreman, MD Inpatient   05/20/2016 1932 06/14/2016 1635 Full Code [id number]  Leanor Kail, PA Inpatient   05/13/2016 1323 05/20/2016 1850 Full Code [id number]  Vaughan Basta, MD Inpatient   07/03/2015 1714 07/05/2015 1543 Full Code [id number]  Fritzi Mandes, MD Inpatient   06/26/2015 1216 06/27/2015 1914 Full Code [id number]  Wellington Hampshire, MD Inpatient   06/22/2015 0926 06/26/2015 1216 Full Code [id number]  Minna Merritts, MD Inpatient   06/19/2015 1135 06/22/2015 0926 Full Code [id number]  Hillary Bow, MD ED   Advance Care Planning Activity           Consults vascular DVT Prophylaxi,heparin  Lab Results  Component Value Date   PLT 271 02/09/2019     Time Spent in minutes   45min Greater than 50% of time spent in care coordination and counseling patient regarding the condition and plan of care.   Dustin Flock M.D on 02/18/2019 at 2:14 PM  Between 7am to 6pm - Pager - [phone]  After 6pm go to [email or web address] - [facility]   Office  [phone]

## 2019-02-18 NOTE — Progress Notes (Signed)
Follow up - Critical Care Medicine Note  Patient Details:    Theresa Rogers is an 68 y.o. female admitted 9/7 with generalized weakness, and acute on chronic diarrhea secondary to C. Difficile.  Became hypotensive on 9/8 (likely secondary to hypovolemic shock) requiring transfer to ICU for possible pressors.  Lines, Airways, Drains: Peritoneal catheter R Fem A line   Anti-infectives:  Anti-infectives (From admission, onward)   Start     Dose/Rate Route Frequency Ordered Stop   02/09/2019 2200  vancomycin (VANCOCIN) 50 mg/mL oral solution 125 mg     125 mg Oral 4 times daily 02/22/2019 2130 02/26/19 2159      Microbiology: Results for orders placed or performed during the hospital encounter of 03/09/2019  SARS CORONAVIRUS 2 (TAT 6-24 HRS) Nasopharyngeal Nasopharyngeal Swab     Status: None   Collection Time: 02/23/2019  8:20 PM   Specimen: Nasopharyngeal Swab  Result Value Ref Range Status   SARS Coronavirus 2 NEGATIVE NEGATIVE Final    Comment: (NOTE) SARS-CoV-2 target nucleic acids are NOT DETECTED. The SARS-CoV-2 RNA is generally detectable in upper and lower respiratory specimens during the acute phase of infection. Negative results do not preclude SARS-CoV-2 infection, do not rule out co-infections with other pathogens, and should not be used as the sole basis for treatment or other patient management decisions. Negative results must be combined with clinical observations, patient history, and epidemiological information. The expected result is Negative. Fact Sheet for Patients: SugarRoll.be Fact Sheet for Healthcare Providers: https://www.woods-mathews.com/ This test is not yet approved or cleared by the [place] FDA and  has been authorized for detection and/or diagnosis of SARS-CoV-2 by FDA under an Emergency Use Authorization (EUA). This EUA will remain  in effect (meaning this test can be used) for the duration of the COVID-19  declaration under Section 56 4(b)(1) of the Act, 21 U.S.C. section 360bbb-3(b)(1), unless the authorization is terminated or revoked sooner. Performed at [facility], [place] [address], [place], [place] [address]   Gastrointestinal Panel by PCR , Stool     Status: None   Collection Time: 02/15/2019  7:53 AM   Specimen: Stool  Result Value Ref Range Status   Campylobacter species NOT DETECTED NOT DETECTED Final   Plesimonas shigelloides NOT DETECTED NOT DETECTED Final   Salmonella species NOT DETECTED NOT DETECTED Final   Yersinia enterocolitica NOT DETECTED NOT DETECTED Final   Vibrio species NOT DETECTED NOT DETECTED Final   Vibrio cholerae NOT DETECTED NOT DETECTED Final   Enteroaggregative E coli (EAEC) NOT DETECTED NOT DETECTED Final   Enteropathogenic E coli (EPEC) NOT DETECTED NOT DETECTED Final   Enterotoxigenic E coli (ETEC) NOT DETECTED NOT DETECTED Final   Shiga like toxin producing E coli (STEC) NOT DETECTED NOT DETECTED Final   Shigella/Enteroinvasive E coli (EIEC) NOT DETECTED NOT DETECTED Final   Cryptosporidium NOT DETECTED NOT DETECTED Final   Cyclospora cayetanensis NOT DETECTED NOT DETECTED Final   Entamoeba histolytica NOT DETECTED NOT DETECTED Final   Giardia lamblia NOT DETECTED NOT DETECTED Final   Adenovirus F40/41 NOT DETECTED NOT DETECTED Final   Astrovirus NOT DETECTED NOT DETECTED Final   Norovirus GI/GII NOT DETECTED NOT DETECTED Final   Rotavirus A NOT DETECTED NOT DETECTED Final   Sapovirus (I, II, IV, and V) NOT DETECTED NOT DETECTED Final    Comment: Performed at [facility], [address] [place], [place] [address]  C Difficile Quick Screen w PCR reflex     Status:  Abnormal   Collection Time: 02/28/2019  7:53 AM   Specimen: STOOL  Result Value Ref Range Status   C Diff antigen POSITIVE (A) NEGATIVE Final   C Diff toxin NEGATIVE NEGATIVE Final   C Diff interpretation Results are indeterminate. See PCR results.  Final    Comment:  Performed at [facility], [address] [place], [place] [address]  C. Diff by PCR, Reflexed     Status: Abnormal   Collection Time: 02/26/2019  7:53 AM  Result Value Ref Range Status   Toxigenic C. Difficile by PCR POSITIVE (A) NEGATIVE Final    Comment: Positive for toxigenic C. difficile with little to no toxin production. Only treat if clinical presentation suggests symptomatic illness. Performed at [facility], [address] [place], [place] [address]   MRSA PCR Screening     Status: None   Collection Time: 03/10/2019  6:12 PM   Specimen: Nasopharyngeal  Result Value Ref Range Status   MRSA by PCR NEGATIVE NEGATIVE Final    Comment:        The GeneXpert MRSA Assay (FDA approved for NASAL specimens only), is one component of a comprehensive MRSA colonization surveillance program. It is not intended to diagnose MRSA infection nor to guide or monitor treatment for MRSA infections. Performed at [facility], [address] [place], [place] [address]   CULTURE, BLOOD (ROUTINE X 2) w Reflex to ID Panel     Status: None (Preliminary result)   Collection Time: 02/11/2019 11:26 PM   Specimen: BLOOD  Result Value Ref Range Status   Specimen Description BLOOD BLOOD RIGHT FOREARM  Final   Special Requests   Final    BOTTLES DRAWN AEROBIC ONLY Blood Culture adequate volume   Culture   Final    NO GROWTH 2 DAYS Performed at [facility], [address] [place], [place] [address]    Report Status PENDING  Incomplete  CULTURE, BLOOD (ROUTINE X 2) w Reflex to ID Panel     Status: None (Preliminary result)   Collection Time: 02/09/2019 11:27 PM   Specimen: BLOOD  Result Value Ref Range Status   Specimen Description BLOOD RAC  Final   Special Requests   Final    BOTTLES DRAWN AEROBIC ONLY Blood Culture adequate volume   Culture   Final    NO GROWTH 2 DAYS Performed at [facility], [address] [place], [place] [address]    Report  Status PENDING  Incomplete    Best Practice/Protocols:  DVT: Heparin SQ GI: Not indicated   Events: 9/7>> admission to MedSurg unit 9/8>> transfer to ICU due to hypotension 9/8>> removal of clotted PermCath by vascular surgery 9/8>> ID consulted 9/9>> Right Femoral A-line placed 9/10> 2 D Echo shows decreased LVEF when compared to 12/2018  Studies: Dg Chest Port 1 View  Result Date: 02/17/2019 CLINICAL DATA:  69 year old female with hypoxia. EXAM: PORTABLE CHEST 1 VIEW COMPARISON:  Chest radiograph dated 02/17/2019 FINDINGS: Moderate right and small left pleural effusion similar or minimally increased since the earlier radiograph. Associated bibasilar densities may represent atelectasis or infiltrate. There is no pneumothorax. There is cardiomegaly with vascular congestion. Median sternotomy wires and CABG vascular clips. No acute osseous pathology. IMPRESSION: 1. Moderate right and small left pleural effusions similar or minimally increased since the earlier radiograph. Associated bibasilar densities may represent atelectasis or infiltrate. 2. Cardiomegaly with vascular congestion. Electronically Signed   By: Anner Crete M.D.   On: 02/17/2019  17:33   Dg Chest Port 1 View  Result Date: 02/17/2019 CLINICAL DATA:  Shortness of breath. EXAM: PORTABLE CHEST 1 VIEW COMPARISON:  February 15, 2019 FINDINGS: Stable postsurgical changes from CABG. Enlarged cardiac silhouette. Moderate right pleural effusion. Bilateral lower lung fields streaky opacities which likely represent atelectasis. Airspace consolidation the right lung base cannot be excluded. Osseous structures are without acute abnormality. Soft tissues are grossly normal. IMPRESSION: 1. Moderate right pleural effusion. 2. Left lower lung field streaky opacities likely represent atelectasis. 3. Moderate right pleural effusion. Atelectasis versus airspace consolidation in the right lung base. Electronically Signed   By: Fidela Salisbury  M.D.   On: 02/17/2019 10:32   Dg Chest Port 1 View  Result Date: 02/19/2019 CLINICAL DATA:  Initial evaluation for acute cough. EXAM: PORTABLE CHEST 1 VIEW COMPARISON:  Prior radiograph from 12/28/2018. FINDINGS: Median sternotomy wires underlying CABG markers and surgical clips, stable. Cardiomegaly unchanged. Mediastinal silhouette within normal limits. Right-sided hemodialysis catheter in place with tip overlying the cavoatrial junction. Lungs are hypoinflated. Small to moderate right pleural effusion with associated right basilar opacity, likely atelectasis, similar to previous. Streaky left basilar atelectatic changes also similar. Mild perihilar vascular congestion without pulmonary edema. No other new focal infiltrates. No pneumothorax. No acute osseous finding. IMPRESSION: 1. Small to moderate right pleural effusion, similar to previous. 2. Superimposed bibasilar atelectatic changes, also similar. No new focal infiltrates. 3. Cardiomegaly with mild perihilar vascular congestion without overt pulmonary edema. Electronically Signed   By: Jeannine Boga M.D.   On: 03/08/2019 22:22    Consults: Treatment Team:  Anthonette Legato, MD Minna Merritts, MD [facility], [facility], MD   Subjective:    Overnight Issues: Overnight no significant issues however, remains on dopamine.  Objective:  Vital signs for last 24 hours: Temp:  [97.7 F (36.5 C)-98 F (36.7 C)] 97.7 F (36.5 C) (09/10 1600) Pulse Rate:  [54-76] 74 (09/10 1800) Resp:  [10-20] 19 (09/10 1800) SpO2:  [84 %-97 %] 96 % (09/10 1800) Arterial Line BP: (90-132)/(37-56) 132/50 (09/10 1800) Weight:  [66.3 kg] 66.3 kg (09/10 0500)  Hemodynamic parameters for last 24 hours:    Intake/Output from previous day: 09/09 0701 - 09/10 0700 In: 807.1 [I.V.:807.1] Out: 200 [Emesis/NG output:200]  Intake/Output this shift: No intake/output data recorded.  Vent settings for last 24 hours:    Physical Exam:  General: Acutely on  chronically ill-appearing female, no acute distress Neuro:  Awake and alert, oriented, no focal deficits, speech clear HEENT: Atraumatic, normocephalic, neck supple, no JVD, pupils PERRLA Cardiovascular: Regular rate and rhythm, S1-S2, no murmurs rubs or gallops Lungs: Clear to auscultation bilaterally with fine crackles bilateral bases,  no wheezing, no distress Abdomen: Soft, nontender, nondistended, no guarding or rebound tenderness, normal bowel sounds, peritoneal dialysis catheter present Musculoskeletal: Generalized weakness, no deformities, no edema Skin: Warm and dry, abrasion to left thigh and left malleolus   Assessment/Plan:  Hypotension, likely hypovolemic shock in setting of Acute on Chronic Diarrhea Chronic HFrEF with acute exacerbation (EF 20 to 25%) Hx: Paroxysmal atrial fib, HTN, Hyperlipidemia -Continuous cardiac monitoring -Maintain MAP greater than 60 given she is ESRD -Received 500 mL bolus -LVEF 20-25% (worse than 12/2018 Echo -Holding home BP meds -Continue Midodrine -Continue Dopamine, add dobutamine given drop in EF. -continue  Arterial Line for now due to BP lability -Peritoneal dialysis for volume removal -Patient not on anticoagulation for atrial fib due to chronic anemia  ESRD on Peritoneal Dialysis Hypokalemia>>resolved -Monitor I&O's / urinary output -  Follow BMP -Avoid nephrotoxic agents as able -Replace electrolytes as indicated -Nephrology following,peritoneal dialysis as per nephrology -Clotted PermCath removed 9/8 by vascular surgery  Acute on chronic diarrhea, C. difficile antigen positive but toxin negative ~  +TOXIGENIC C-DIFF -Monitor fever curve -Trend WBCs -Procalcitonin  has been low -Follow blood and urine cultures as above -Chest x-ray without infiltrate -Continue empiric p.o. vancomycin for now  (complete 9/18) -ID following  Anemia of chronic disease -Monitor for S/Sx of bleeding -Trend CBC -Subcu heparin for VTE  Prophylaxis  -Transfuse for Hgb <7  Diabetes mellitus -CBGs -SSI -Follow ICU hypo-hyperglycemia protocol      LOS: 3 days   Additional comments: Discussed during multidisciplinary rounds, discussed with Dr. Juleen China.  Critical Care Total Time*: 35 Minutes  C. Derrill Kay, MD [facility] 02/18/2019  *Care during the described time interval was provided by me and/or other providers on the critical care team.  I have reviewed this patient's available data, including medical history, events of note, physical examination and test results as part of my evaluation.

## 2019-02-18 NOTE — Progress Notes (Signed)
[facility]  ROUNDING NOTE   Subjective:   Hypotensive: on dobutamine and dopamine  Seen and examined on peritoneal dialysis treatment. Tolerating treatment well.   Objective:  Vital signs in last 24 hours:  Temp:  [97.7 F (36.5 C)-98 F (36.7 C)] 97.7 F (36.5 C) (09/10 1200) Pulse Rate:  [54-88] 54 (09/10 1300) Resp:  [9-27] 13 (09/10 1300) SpO2:  [87 %-97 %] 95 % (09/10 1300) Arterial Line BP: (90-143)/(41-69) 90/41 (09/10 1300) Weight:  [66.3 kg] 66.3 kg (09/10 0500)  Weight change: -4.3 kg Filed Weights   02/09/2019 1425 02/09/2019 1808 02/18/19 0500  Weight: 70.6 kg 61.8 kg 66.3 kg    Intake/Output: I/O last 3 completed shifts: In: 9645.2 [I.V.:1645.2; Other:8000] Out: 200 [Emesis/NG output:200]   Intake/Output this shift:  No intake/output data recorded.  Physical Exam: General: NAD, sitting in bed  Head: Normocephalic, atraumatic. Moist oral mucosal membranes  Eyes: Anicteric, PERRL  Neck: Supple, trachea midline  Lungs:  Clear to auscultation  Heart: bradycardia  Abdomen:  Soft, nontender,   Extremities:  trace peripheral edema. Bilateral lower extremity wounds  Neurologic: Nonfocal, moving all four extremities  Skin: No lesions  Access: left AVF, PD catheter    Basic Metabolic Panel: Recent Labs  Lab 02/22/2019 1459 02/18/2019 0143 02/23/2019 2324 02/17/19 0518 02/18/19 1250  NA 132* 134* 132* 134* 133*  K 2.7* 2.7* 3.7 3.8 4.2  CL 95* 98 98 103 100  CO2 20* 22 19* 17* 16*  GLUCOSE 89 84 209* 176* 222*  BUN 37* 40* 44* 44* 51*  CREATININE 6.78* 6.62* 7.31* 6.87* 7.33*  CALCIUM 6.6* 6.4* 6.6* 6.0* 7.1*  MG 1.9  --   --   --   --   PHOS  --   --  7.8* 7.5*  --     Liver Function Tests: Recent Labs  Lab 02/09/2019 1459 03/07/2019 0143 02/15/2019 2324  AST 27 25  --   ALT 22 20  --   ALKPHOS 342* 338*  --   BILITOT 0.9 0.9  --   PROT 5.8* 5.2*  --   ALBUMIN 2.3* 2.0* 2.3*   No results for input(s): LIPASE, AMYLASE in the last 168  hours. No results for input(s): AMMONIA in the last 168 hours.  CBC: Recent Labs  Lab 02/27/2019 1459 02/24/2019 0143 02/10/2019 2324  WBC 11.3* 9.5 11.7*  NEUTROABS 9.4*  --  9.7*  HGB 12.0 11.3* 12.8  HCT 35.6* 33.0* 39.3  MCV 92.0 91.9 95.2  PLT 233 206 271    Cardiac Enzymes: No results for input(s): CKTOTAL, CKMB, CKMBINDEX, TROPONINI in the last 168 hours.  BNP: Invalid input(s): POCBNP  CBG: Recent Labs  Lab 02/17/19 1142 02/17/19 1637 02/17/19 2105 02/18/19 0735 02/18/19 1201  GLUCAP 184* 198* 169* 121* 108*    Microbiology: Results for orders placed or performed during the hospital encounter of 02/24/2019  SARS CORONAVIRUS 2 (TAT 6-24 HRS) Nasopharyngeal Nasopharyngeal Swab     Status: None   Collection Time: 02/20/2019  8:20 PM   Specimen: Nasopharyngeal Swab  Result Value Ref Range Status   SARS Coronavirus 2 NEGATIVE NEGATIVE Final    Comment: (NOTE) SARS-CoV-2 target nucleic acids are NOT DETECTED. The SARS-CoV-2 RNA is generally detectable in upper and lower respiratory specimens during the acute phase of infection. Negative results do not preclude SARS-CoV-2 infection, do not rule out co-infections with other pathogens, and should not be used as the sole basis for treatment or other patient management  decisions. Negative results must be combined with clinical observations, patient history, and epidemiological information. The expected result is Negative. Fact Sheet for Patients: SugarRoll.be Fact Sheet for Healthcare Providers: https://www.woods-mathews.com/ This test is not yet approved or cleared by the [place] FDA and  has been authorized for detection and/or diagnosis of SARS-CoV-2 by FDA under an Emergency Use Authorization (EUA). This EUA will remain  in effect (meaning this test can be used) for the duration of the COVID-19 declaration under Section 56 4(b)(1) of the Act, 21 U.S.C. section  360bbb-3(b)(1), unless the authorization is terminated or revoked sooner. Performed at [facility], [place] [address], [place], [place] [address]   Gastrointestinal Panel by PCR , Stool     Status: None   Collection Time: 02/24/2019  7:53 AM   Specimen: Stool  Result Value Ref Range Status   Campylobacter species NOT DETECTED NOT DETECTED Final   Plesimonas shigelloides NOT DETECTED NOT DETECTED Final   Salmonella species NOT DETECTED NOT DETECTED Final   Yersinia enterocolitica NOT DETECTED NOT DETECTED Final   Vibrio species NOT DETECTED NOT DETECTED Final   Vibrio cholerae NOT DETECTED NOT DETECTED Final   Enteroaggregative E coli (EAEC) NOT DETECTED NOT DETECTED Final   Enteropathogenic E coli (EPEC) NOT DETECTED NOT DETECTED Final   Enterotoxigenic E coli (ETEC) NOT DETECTED NOT DETECTED Final   Shiga like toxin producing E coli (STEC) NOT DETECTED NOT DETECTED Final   Shigella/Enteroinvasive E coli (EIEC) NOT DETECTED NOT DETECTED Final   Cryptosporidium NOT DETECTED NOT DETECTED Final   Cyclospora cayetanensis NOT DETECTED NOT DETECTED Final   Entamoeba histolytica NOT DETECTED NOT DETECTED Final   Giardia lamblia NOT DETECTED NOT DETECTED Final   Adenovirus F40/41 NOT DETECTED NOT DETECTED Final   Astrovirus NOT DETECTED NOT DETECTED Final   Norovirus GI/GII NOT DETECTED NOT DETECTED Final   Rotavirus A NOT DETECTED NOT DETECTED Final   Sapovirus (I, II, IV, and V) NOT DETECTED NOT DETECTED Final    Comment: Performed at [facility], [address] [place], [place] [address]  C Difficile Quick Screen w PCR reflex     Status: Abnormal   Collection Time: 02/24/2019  7:53 AM   Specimen: STOOL  Result Value Ref Range Status   C Diff antigen POSITIVE (A) NEGATIVE Final   C Diff toxin NEGATIVE NEGATIVE Final   C Diff interpretation Results are indeterminate. See PCR results.  Final    Comment: Performed at [facility], [address] [place], [place]  [address]  C. Diff by PCR, Reflexed     Status: Abnormal   Collection Time: 03/05/2019  7:53 AM  Result Value Ref Range Status   Toxigenic C. Difficile by PCR POSITIVE (A) NEGATIVE Final    Comment: Positive for toxigenic C. difficile with little to no toxin production. Only treat if clinical presentation suggests symptomatic illness. Performed at [facility], [address] [place], [place] [address]   MRSA PCR Screening     Status: None   Collection Time: 03/05/2019  6:12 PM   Specimen: Nasopharyngeal  Result Value Ref Range Status   MRSA by PCR NEGATIVE NEGATIVE Final    Comment:        The GeneXpert MRSA Assay (FDA approved for NASAL specimens only), is one component of a comprehensive MRSA colonization surveillance program. It is not intended to diagnose MRSA infection nor to guide or monitor treatment for MRSA infections. Performed at [facility], [phone] [place], [place] [address]   CULTURE, BLOOD (ROUTINE X 2) w Reflex to ID Panel     Status: None (Preliminary result)   Collection Time: 02/25/2019 11:26 PM   Specimen: BLOOD  Result Value Ref Range Status   Specimen Description BLOOD BLOOD RIGHT FOREARM  Final   Special Requests   Final    BOTTLES DRAWN AEROBIC ONLY Blood Culture adequate volume   Culture   Final    NO GROWTH 2 DAYS Performed at [facility], [address] [place], [place] [address]    Report Status PENDING  Incomplete  CULTURE, BLOOD (ROUTINE X 2) w Reflex to ID Panel     Status: None (Preliminary result)   Collection Time: 03/01/2019 11:27 PM   Specimen: BLOOD  Result Value Ref Range Status   Specimen Description BLOOD RAC  Final   Special Requests   Final    BOTTLES DRAWN AEROBIC ONLY Blood Culture adequate volume   Culture   Final    NO GROWTH 2 DAYS Performed at [facility], [address], [place], [place] [address]    Report Status PENDING  Incomplete    Coagulation Studies: No results for  input(s): LABPROT, INR in the last 72 hours.  Urinalysis: No results for input(s): COLORURINE, LABSPEC, PHURINE, GLUCOSEU, HGBUR, BILIRUBINUR, KETONESUR, PROTEINUR, UROBILINOGEN, NITRITE, LEUKOCYTESUR in the last 72 hours.  Invalid input(s): APPERANCEUR    Imaging: Dg Chest Port 1 View  Result Date: 02/17/2019 CLINICAL DATA:  68 year old female with hypoxia. EXAM: PORTABLE CHEST 1 VIEW COMPARISON:  Chest radiograph dated 02/17/2019 FINDINGS: Moderate right and small left pleural effusion similar or minimally increased since the earlier radiograph. Associated bibasilar densities may represent atelectasis or infiltrate. There is no pneumothorax. There is cardiomegaly with vascular congestion. Median sternotomy wires and CABG vascular clips. No acute osseous pathology. IMPRESSION: 1. Moderate right and small left pleural effusions similar or minimally increased since the earlier radiograph. Associated bibasilar densities may represent atelectasis or infiltrate. 2. Cardiomegaly with vascular congestion. Electronically Signed   By: Anner Crete M.D.   On: 02/17/2019 17:33   Dg Chest Port 1 View  Result Date: 02/17/2019 CLINICAL DATA:  Shortness of breath. EXAM: PORTABLE CHEST 1 VIEW COMPARISON:  February 15, 2019 FINDINGS: Stable postsurgical changes from CABG. Enlarged cardiac silhouette. Moderate right pleural effusion. Bilateral lower lung fields streaky opacities which likely represent atelectasis. Airspace consolidation the right lung base cannot be excluded. Osseous structures are without acute abnormality. Soft tissues are grossly normal. IMPRESSION: 1. Moderate right pleural effusion. 2. Left lower lung field streaky opacities likely represent atelectasis. 3. Moderate right pleural effusion. Atelectasis versus airspace consolidation in the right lung base. Electronically Signed   By: Fidela Salisbury M.D.   On: 02/17/2019 10:32     Medications:   . dialysis solution 1.5% low-MG/low-CA     . DOBUTamine 2.5 mcg/kg/min (02/18/19 1304)  . DOPamine 5 mcg/kg/min (02/17/19 1627)   . aspirin EC  81 mg Oral Daily  . atorvastatin  80 mg Oral QPM  . calcium acetate  667 mg Oral BID WC  . Chlorhexidine Gluconate Cloth  6 each Topical Daily  . collagenase   Topical Daily  . feeding supplement (NEPRO CARB STEADY)  237 mL Oral BID BM  . gentamicin cream  1 application Topical Daily  . heparin  5,000 Units Subcutaneous Q8H  . insulin aspart  0-5 Units Subcutaneous QHS  . insulin aspart  0-9 Units Subcutaneous TID WC  .  midodrine  10 mg Oral TID WC  . multivitamin  1 tablet Oral QHS  . potassium chloride  40 mEq Oral Daily  . vancomycin  125 mg Oral QID   HYDROmorphone HCl, ondansetron (ZOFRAN) IV, traMADol  Assessment/ Plan:  Ms. Kimyada Flinders is a 68 y.o. white female with end stage renal disease on peritoneal dialysis, hypertension, congestive heart failure, coronary artery disease, diabetes mellitus type II  [facility] 61kg Peritoneal dialysis CCPD 9 hours 5 exchanges 2 liter fills and last fill of 562mL.   1. End Stage Renal Disease - Seen and examined on peritoneal dialysis treatment. 8 hours 3 exchanges and 2 liter fills due to hypotension. Dextrose 1.5%.   2. Hypotension: holding home regimen of furosemide, hydralazine and carvedilol.  Requiring vasopressors Appreciate cardiology input - Increase dose of midodrine.   3. Anemia of chronic kidney disease:   - epo as outpatient.   4. Secondary Hyperparathyroidism: with hyperphosphatemia. Outpatient labs on 9/2 phos 8, calcium 6.8 - calcium acetate with meals.   5. Hypokalemia: improved.  - Potassium supplementation.  - Hold furosemide.    LOS: 3 Shera Laubach 9/10/20202:31 PM

## 2019-02-18 NOTE — Progress Notes (Signed)
Date of Admission:  03/09/2019        Subjective: In ICU No specific complaints Still has pain in her legs. Says no diarrhea since this morning. No fever Still nauseous  Medications:  . aspirin EC  81 mg Oral Daily  . atorvastatin  80 mg Oral QPM  . calcium acetate  667 mg Oral BID WC  . Chlorhexidine Gluconate Cloth  6 each Topical Daily  . collagenase   Topical Daily  . feeding supplement (NEPRO CARB STEADY)  237 mL Oral BID BM  . gentamicin cream  1 application Topical Daily  . heparin  5,000 Units Subcutaneous Q8H  . insulin aspart  0-5 Units Subcutaneous QHS  . insulin aspart  0-9 Units Subcutaneous TID WC  . midodrine  10 mg Oral TID WC  . multivitamin  1 tablet Oral QHS  . potassium chloride  40 mEq Oral Daily  . vancomycin  125 mg Oral QID    Objective: Vital signs in last 24 hours: Temp:  [97.7 F (36.5 C)-98 F (36.7 C)] 97.7 F (36.5 C) (09/10 1600) Pulse Rate:  [54-76] 74 (09/10 1800) Resp:  [10-20] 19 (09/10 1800) SpO2:  [84 %-97 %] 96 % (09/10 1800) Arterial Line BP: (90-132)/(37-56) 132/50 (09/10 1800) Weight:  [66.3 kg] 66.3 kg (09/10 0500)  PHYSICAL EXAM:  General: Alert, cooperative, no distress, appears stated age.  Neck: Supple, symmetrical, no adenopathy, thyroid: non tender no carotid bruit and no JVD. Back: No CVA tenderness. Lungs: Clear to auscultation bilaterally. No Wheezing or Rhonchi. No rales. Heart: Irregular Abdomen: Soft, PD catheter site okay Extremities: Bilateral leg ulcers.  On the left leg it is on the popliteal region is a dried eschar with minimal erythema around.  On the right leg it is on the lateral malleolus and is about 1 cm.  Both the thighs have brawny edema       Left hand middle finger has a ulcer with scab on the DIP area Cannot feel the dorsalis pedis. Skin: as above Lymph: Cervical, supraclavicular normal. Neurologic: Grossly non-focal  Lab Results Recent Labs    02/14/2019 0143 03/01/2019 2324  02/17/19 0518 02/18/19 1250  WBC 9.5 11.7*  --   --   HGB 11.3* 12.8  --   --   HCT 33.0* 39.3  --   --   NA 134* 132* 134* 133*  K 2.7* 3.7 3.8 4.2  CL 98 98 103 100  CO2 22 19* 17* 16*  BUN 40* 44* 44* 51*  CREATININE 6.62* 7.31* 6.87* 7.33*   Liver Panel Recent Labs    02/24/2019 0143 02/19/2019 2324  PROT 5.2*  --   ALBUMIN 2.0* 2.3*  AST 25  --   ALT 20  --   ALKPHOS 338*  --   BILITOT 0.9  --    Sedimentation Rate Recent Labs    02/17/19 0518  ESRSEDRATE 17   C-Reactive Protein No results for input(s): CRP in the last 72 hours.  Microbiology:  Studies/Results: Dg Chest Port 1 View  Result Date: 02/17/2019 CLINICAL DATA:  68 year old female with hypoxia. EXAM: PORTABLE CHEST 1 VIEW COMPARISON:  Chest radiograph dated 02/17/2019 FINDINGS: Moderate right and small left pleural effusion similar or minimally increased since the earlier radiograph. Associated bibasilar densities may represent atelectasis or infiltrate. There is no pneumothorax. There is cardiomegaly with vascular congestion. Median sternotomy wires and CABG vascular clips. No acute osseous pathology. IMPRESSION: 1. Moderate right and small left pleural effusions similar  or minimally increased since the earlier radiograph. Associated bibasilar densities may represent atelectasis or infiltrate. 2. Cardiomegaly with vascular congestion. Electronically Signed   By: Anner Crete M.D.   On: 02/17/2019 17:33   Dg Chest Port 1 View  Result Date: 02/17/2019 CLINICAL DATA:  Shortness of breath. EXAM: PORTABLE CHEST 1 VIEW COMPARISON:  February 15, 2019 FINDINGS: Stable postsurgical changes from CABG. Enlarged cardiac silhouette. Moderate right pleural effusion. Bilateral lower lung fields streaky opacities which likely represent atelectasis. Airspace consolidation the right lung base cannot be excluded. Osseous structures are without acute abnormality. Soft tissues are grossly normal. IMPRESSION: 1. Moderate right  pleural effusion. 2. Left lower lung field streaky opacities likely represent atelectasis. 3. Moderate right pleural effusion. Atelectasis versus airspace consolidation in the right lung base. Electronically Signed   By: Fidela Salisbury M.D.   On: 02/17/2019 10:32     Assessment/Plan:  Hypotension .  Likely a combination of hypovolemia , cardiac with an EF of 20 to 25% and probably autonomic neuropathy - on dopamine, dobutamine and midodrine Cortisol level normal Home BP meds on hold.  C. difficile diarrhea.  Even though the toxin is negative but the antigen and PCR positive.  Treating like C. difficile colitis because of the chronic diarrhea.  Will get 10 -14 days of p.o. vancomycin.  She likely has underlying diabetes related autonomic dysfunction of the bowel as well  Bilateral pain legs with ulceration.  Rule out peripheral arterial disease, vasculitis, calciphylaxis. No evidence of overt infection.  Persistent nausea and vomiting.  Rule out gastroparesis secondary to diabetes  End-stage renal disease on peritoneal dialysis  Anemia of chronic disease  Diabetes mellitus management as per primary team  High TSH noted from yesterday's labs.  Will defer to the primary team for thyroxine supplementation if needed  Discussed the management with the patient and her nurse.  ID will sign off.  Call if needed

## 2019-02-18 NOTE — Progress Notes (Signed)
Progress Note  Patient Name: Theresa Rogers Date of Encounter: 02/18/2019  Primary Cardiologist: Kathlyn Sacramento, MD   Subjective   No CP, palpitations, or racing HR. No SOB. Feels better than yesterday.  Inpatient Medications    Scheduled Meds: . aspirin EC  81 mg Oral Daily  . atorvastatin  80 mg Oral QPM  . calcium acetate  667 mg Oral BID WC  . Chlorhexidine Gluconate Cloth  6 each Topical Daily  . collagenase   Topical Daily  . feeding supplement (NEPRO CARB STEADY)  237 mL Oral BID BM  . gentamicin cream  1 application Topical Daily  . heparin  5,000 Units Subcutaneous Q8H  . insulin aspart  0-5 Units Subcutaneous QHS  . insulin aspart  0-9 Units Subcutaneous TID WC  . midodrine  5 mg Oral TID WC  . multivitamin  1 tablet Oral QHS  . potassium chloride  40 mEq Oral Daily  . vancomycin  125 mg Oral QID   Continuous Infusions: . dialysis solution 1.5% low-MG/low-CA    . DOPamine 5 mcg/kg/min (02/17/19 1627)   PRN Meds: HYDROmorphone HCl, ondansetron (ZOFRAN) IV, traMADol   Vital Signs    Vitals:   02/18/19 0400 02/18/19 0500 02/18/19 0600 02/18/19 0700  BP:      Pulse: 65 72 65 63  Resp: 20 16 10 16   Temp:      TempSrc:      SpO2: 93% 92% 95% 96%  Weight:  66.3 kg    Height:        Intake/Output Summary (Last 24 hours) at 02/18/2019 1141 Last data filed at 02/17/2019 2100 Gross per 24 hour  Intake 265.91 ml  Output 200 ml  Net 65.91 ml   Last 3 Weights 02/18/2019 02/09/2019 02/11/2019  Weight (lbs) 146 lb 2.6 oz 136 lb 3.9 oz 155 lb 10.3 oz  Weight (kg) 66.3 kg 61.8 kg 70.6 kg      Telemetry    SR with HR 60s - Personally Reviewed  ECG    No new tracings - Personally Reviewed  Physical Exam   GEN: No acute distress.   Neck: No JVD Cardiac: RRR, 1/6 systolic murmur, no rubs, or gallops.  Respiratory: Clear to auscultation bilaterally. GI: Soft, nontender, non-distended  MS: No edema; No deformity. Neuro:  Nonfocal  Psych: Normal affect    Labs    High Sensitivity Troponin:  No results for input(s): TROPONINIHS in the last 720 hours.    Cardiac EnzymesNo results for input(s): TROPONINI in the last 168 hours. No results for input(s): TROPIPOC in the last 168 hours.   Chemistry Recent Labs  Lab 02/14/2019 1459 02/15/2019 0143 03/05/2019 2324 02/17/19 0518  NA 132* 134* 132* 134*  K 2.7* 2.7* 3.7 3.8  CL 95* 98 98 103  CO2 20* 22 19* 17*  GLUCOSE 89 84 209* 176*  BUN 37* 40* 44* 44*  CREATININE 6.78* 6.62* 7.31* 6.87*  CALCIUM 6.6* 6.4* 6.6* 6.0*  PROT 5.8* 5.2*  --   --   ALBUMIN 2.3* 2.0* 2.3*  --   AST 27 25  --   --   ALT 22 20  --   --   ALKPHOS 342* 338*  --   --   BILITOT 0.9 0.9  --   --   GFRNONAA 6* 6* 5* 6*  GFRAA 7* 7* 6* 7*  ANIONGAP 17* 14 15 14      Hematology Recent Labs  Lab 03/07/2019 1459 03/02/2019 0143 02/12/2019  2324  WBC 11.3* 9.5 11.7*  RBC 3.87 3.59* 4.13  HGB 12.0 11.3* 12.8  HCT 35.6* 33.0* 39.3  MCV 92.0 91.9 95.2  MCH 31.0 31.5 31.0  MCHC 33.7 34.2 32.6  RDW 15.7* 15.9* 16.1*  PLT 233 206 271    BNPNo results for input(s): BNP, PROBNP in the last 168 hours.   DDimer No results for input(s): DDIMER in the last 168 hours.   Radiology    Dg Chest Port 1 View  Result Date: 02/17/2019 CLINICAL DATA:  68 year old female with hypoxia. EXAM: PORTABLE CHEST 1 VIEW COMPARISON:  Chest radiograph dated 02/17/2019 FINDINGS: Moderate right and small left pleural effusion similar or minimally increased since the earlier radiograph. Associated bibasilar densities may represent atelectasis or infiltrate. There is no pneumothorax. There is cardiomegaly with vascular congestion. Median sternotomy wires and CABG vascular clips. No acute osseous pathology. IMPRESSION: 1. Moderate right and small left pleural effusions similar or minimally increased since the earlier radiograph. Associated bibasilar densities may represent atelectasis or infiltrate. 2. Cardiomegaly with vascular congestion.  Electronically Signed   By: Anner Crete M.D.   On: 02/17/2019 17:33   Dg Chest Port 1 View  Result Date: 02/17/2019 CLINICAL DATA:  Shortness of breath. EXAM: PORTABLE CHEST 1 VIEW COMPARISON:  February 15, 2019 FINDINGS: Stable postsurgical changes from CABG. Enlarged cardiac silhouette. Moderate right pleural effusion. Bilateral lower lung fields streaky opacities which likely represent atelectasis. Airspace consolidation the right lung base cannot be excluded. Osseous structures are without acute abnormality. Soft tissues are grossly normal. IMPRESSION: 1. Moderate right pleural effusion. 2. Left lower lung field streaky opacities likely represent atelectasis. 3. Moderate right pleural effusion. Atelectasis versus airspace consolidation in the right lung base. Electronically Signed   By: Fidela Salisbury M.D.   On: 02/17/2019 10:32    Cardiac Studies   Echo 02/17/19  1. The left ventricle has severely reduced systolic function, with an ejection fraction of 20-25%. The cavity size was moderately dilated. Left ventricular diastolic Doppler parameters are indeterminate. Left ventricular diffuse hypokinesis.  2. The right ventricle has moderately reduced systolic function. The cavity was moderately enlarged. There is no increase in right ventricular wall thickness. Right ventricular systolic pressure is mildly elevated with an estimated pressure of 38.3  mmHg.  3. Left atrial size was moderately dilated.  4. Mitral valve regurgitation is mild to moderate  2D Echo 12/29/2018: 1. The left ventricle has moderate-severely reduced systolic function, with an ejection fraction of 30-35%. The cavity size was mildly dilated. There is moderately increased left ventricular wall thickness. Left ventricular diastolic Doppler parameters  are consistent with pseudonormalization. Elevated mean left atrial pressure Left ventricular diffuse hypokinesis. 2. The right ventricle has severely reduced systolic  function. The cavity was mildly enlarged. There is no increase in right ventricular wall thickness. Right ventricular systolic pressure is mildly elevated with an estimated pressure of 40.5 mmHg. 3. Left atrial size was severely dilated. 4. The pericardial effusion is posterior to the left ventricle. 5. Trivial pericardial effusion is present. 6. Mitral valve regurgitation is mild to moderate by color flow Doppler. 7. Tricuspid valve regurgitation is mild-moderate. 8. The aortic valve is tricuspid. Mild thickening of the aortic valve. 9. The aortic root is normal in size and structure. 10. The inferior vena cava was normal in size with <50% respiratory variability. __________  [facility] 05/2016:  Prox RCA lesion, 70 %stenosed.  Dist RCA lesion, 80 %stenosed.  Prox RCA to Mid RCA lesion, 0 %stenosed.  Mid Cx lesion, 0 %stenosed.  Dist LAD lesion, 90 %stenosed.  Mid LAD lesion, 95 %stenosed.  Prox Cx lesion, 30 %stenosed.  Mid RCA lesion, 90 %stenosed.  Hemodynamic findings consistent with moderate pulmonary hypertension.  1. Severely elevated filling pressures with an LVEDP of 33 mmHg, moderate pulmonary hypertension with a mean pressure of 40 mmHg and mildly reduced cardiac output at 3.98 with a cardiac index of 2.26. These numbers were on milrinone infusion and multiple days of IV diuresis.  2. Significant three-vessel coronary artery disease with patent stent in the left circumflex. Significant progression of mid LAD disease over the last 10 months and diffuse obstructive disease affecting the right coronary artery in multiple areas.  Recommendations: The patient has very aggressive diabetic disease. She should be evaluated for CABG. There seems to be a graftable area in the mid LAD and distal right coronary artery. PCI of LAD/RCA can be considered if deemed not a good candidate for CABG. The patient will require few days of diuresis. I will transfer the patient to [facility] for evaluation.  Patient Profile     68 y.o. female with a history of CAD s/p three-vessel CABG in 05/2016 as detailed below, HFrEF secondary to ICM, PAF no longer on Coumadin in the setting of abnormal LFT with worsening INR, ESRD on dialysis, DM2, anemia of chronic disease, hypertension, and hyperlipidemia who is being seen for the evaluation of bradycardia.  Assessment & Plan    Bradycardia, junctional escape beats --Asymptomatic. Consider that earlier bradycardic rates were discovered in the setting of sleeping and early in the morning.  Patient also was with significant dry heaving and nausea as well as diarrhea, which could contribute to the arrhythmia.  --Improved rates on dopamine. Continue to monitor on telemetry. Chronotropic competence should be assessed prior to discharge. Avoid AV nodal blocking agents for now given earlier hypotension and bradycardic rates.  --Continue to monitor renal function and electrolytes. Will order BMET. --TSH 18.526. Consider as also contributing to her symptoms. Further workup per IM with endocrinology / PCP follow-up recommended.  Abnormal TSH --TSH 18.526. Consider as contributing to sx. Recommend further workup. Will order T4.   Dilated CM --EF 20-25%, reduced from previous study. Updated echo as above. LV diffuse hypokinesis. RV with mildly elevated pressures. --Fluid status management by peritoneal dialysis. Caution with IVF/ fluids given CM and low output heart failure with worsening EF. --No BB given earlier bradycardic rates and hypotension. Rates have improved with dopamine and with continued stable vitals evidence based HF therapy to be restarted and escalated as tolerated.   CAD s/p CABG --No CP. Continue ASA, lipitor. Given reduced EF / diffuse hypokinesis, recommend consider further ischemic workup once recovered / stable with careful consideration of current comorbid conditions.  PAF --No Afib on telemetry thus far. Not on  anticoagulation dating back to 2019 given high risk of falls with LFT abnormality. No BB as above.   Cdiff --As above, caution with fluids given low EF. Per IM.  ESRD --Cr improved as of yesterday. Will order BMET for today. Peritoneal HD per nephrology.  Hypokalemia --No labs yet today. Will order BMET. K previously 3.8. Replete with goal 4.0.  For questions or updates, please contact [facility] Please consult [email or web address] for contact info under        Signed, Arvil Chaco, PA-C  02/18/2019, 11:41 AM

## 2019-02-18 NOTE — Progress Notes (Signed)
PD COMPLETE NO C/O UF 186, I DRAIN 1107, PT REMAIN ALERT AND ORIENTED NO C/O NAUSEA   02/18/19 2030  Completion  Treatment Status Complete  Procedure Comments  Tolerated treatment well? Yes  Education / Care Plan  Dialysis Education Provided Yes  Documented Education in Care Plan Yes

## 2019-02-19 ENCOUNTER — Inpatient Hospital Stay: Payer: Medicare Other

## 2019-02-19 DIAGNOSIS — I25118 Atherosclerotic heart disease of native coronary artery with other forms of angina pectoris: Secondary | ICD-10-CM

## 2019-02-19 LAB — BASIC METABOLIC PANEL
Anion gap: 16 — ABNORMAL HIGH (ref 5–15)
BUN: 54 mg/dL — ABNORMAL HIGH (ref 8–23)
CO2: 19 mmol/L — ABNORMAL LOW (ref 22–32)
Calcium: 7.2 mg/dL — ABNORMAL LOW (ref 8.9–10.3)
Chloride: 98 mmol/L (ref 98–111)
Creatinine, Ser: 7.23 mg/dL — ABNORMAL HIGH (ref 0.44–1.00)
GFR calc Af Amer: 6 mL/min — ABNORMAL LOW (ref >60)
GFR calc non Af Amer: 5 mL/min — ABNORMAL LOW (ref >60)
Glucose, Bld: 179 mg/dL — ABNORMAL HIGH (ref 70–99)
Potassium: 3.6 mmol/L (ref 3.5–5.1)
Sodium: 133 mmol/L — ABNORMAL LOW (ref 135–145)

## 2019-02-19 LAB — CBC WITH DIFFERENTIAL/PLATELET
Abs Immature Granulocytes: 0.07 10*3/uL (ref 0.00–0.07)
Basophils Absolute: 0 10*3/uL (ref 0.0–0.1)
Basophils Relative: 0 %
Eosinophils Absolute: 0 10*3/uL (ref 0.0–0.5)
Eosinophils Relative: 0 %
HCT: 33.2 % — ABNORMAL LOW (ref 36.0–46.0)
Hemoglobin: 11.2 g/dL — ABNORMAL LOW (ref 12.0–15.0)
Immature Granulocytes: 1 %
Lymphocytes Relative: 3 %
Lymphs Abs: 0.4 10*3/uL — ABNORMAL LOW (ref 0.7–4.0)
MCH: 30.9 pg (ref 26.0–34.0)
MCHC: 33.7 g/dL (ref 30.0–36.0)
MCV: 91.5 fL (ref 80.0–100.0)
Monocytes Absolute: 0.9 10*3/uL (ref 0.1–1.0)
Monocytes Relative: 6 %
Neutro Abs: 13.3 10*3/uL — ABNORMAL HIGH (ref 1.7–7.7)
Neutrophils Relative %: 90 %
Platelets: 210 10*3/uL (ref 150–400)
RBC: 3.63 MIL/uL — ABNORMAL LOW (ref 3.87–5.11)
RDW: 15.9 % — ABNORMAL HIGH (ref 11.5–15.5)
WBC: 14.7 10*3/uL — ABNORMAL HIGH (ref 4.0–10.5)
nRBC: 0.3 % — ABNORMAL HIGH (ref 0.0–0.2)

## 2019-02-19 LAB — GLUCOSE, CAPILLARY
Glucose-Capillary: 105 mg/dL — ABNORMAL HIGH (ref 70–99)
Glucose-Capillary: 96 mg/dL (ref 70–99)
Glucose-Capillary: 172 mg/dL — ABNORMAL HIGH (ref 70–99)
Glucose-Capillary: 162 mg/dL — ABNORMAL HIGH (ref 70–99)

## 2019-02-19 LAB — PHOSPHORUS: Phosphorus: 8.2 mg/dL — ABNORMAL HIGH (ref 2.5–4.6)

## 2019-02-19 LAB — MAGNESIUM: Magnesium: 1.9 mg/dL (ref 1.7–2.4)

## 2019-02-19 MED ORDER — HYDROMORPHONE HCL 1 MG/ML IJ SOLN
1.0000 mg | INTRAMUSCULAR | Status: DC | PRN
  Administered 2019-02-19 – 2019-02-20 (×6): 1 mg via INTRAVENOUS
  Filled 2019-02-19 (×6): qty 1

## 2019-02-19 MED ORDER — MIDODRINE HCL 5 MG PO TABS
5.0000 mg | ORAL_TABLET | Freq: Three times a day (TID) | ORAL | Status: DC
  Administered 2019-02-19: 5 mg via ORAL
  Filled 2019-02-19 (×4): qty 1

## 2019-02-19 MED ORDER — HEPARIN SODIUM (PORCINE) 5000 UNIT/ML IJ SOLN
5000.0000 [IU] | Freq: Two times a day (BID) | INTRAMUSCULAR | Status: DC
  Administered 2019-02-19 – 2019-02-20 (×2): 5000 [IU] via SUBCUTANEOUS
  Filled 2019-02-19 (×2): qty 1

## 2019-02-19 MED ORDER — FAMOTIDINE IN NACL 20-0.9 MG/50ML-% IV SOLN
20.0000 mg | INTRAVENOUS | Status: DC
  Administered 2019-02-19: 20 mg via INTRAVENOUS
  Filled 2019-02-19: qty 50

## 2019-02-19 MED ORDER — PROMETHAZINE HCL 25 MG/ML IJ SOLN
12.5000 mg | Freq: Four times a day (QID) | INTRAMUSCULAR | Status: DC | PRN
  Administered 2019-02-19: 12.5 mg via INTRAVENOUS
  Filled 2019-02-19: qty 1

## 2019-02-19 NOTE — Progress Notes (Signed)
[place] at [facility]                                                                                                                                                                                  Patient Demographics   Theresa Rogers, is a 68 y.o. female, DOB - 1951-02-11, [id number]  Admit date - 02/14/2019   Admitting Physician Lang Snow, NP  Outpatient Primary MD for the patient is Marygrace Drought, MD   LOS - 4  Subjective: Patient continues to be very nauseous Was confused this morning  Review of Systems:   CONSTITUTIONAL: Limited due to confusion   Vitals:   Vitals:   02/19/19 1100 02/19/19 1200 02/19/19 1300 02/19/19 1400  BP:  118/68  135/76  Pulse: 66 70 68 71  Resp: 15 11 10 13   Temp:  97.8 F (36.6 C)    TempSrc:  Axillary    SpO2: 96% 100% 100% 98%  Weight:      Height:        Wt Readings from Last 3 Encounters:  02/19/19 66.8 kg  12/28/18 64.4 kg  11/21/18 65.1 kg     Intake/Output Summary (Last 24 hours) at 02/19/2019 1524 Last data filed at 02/19/2019 1400 Gross per 24 hour  Intake 247.08 ml  Output -  Net 247.08 ml    Physical Exam:   GENERAL: Chronically ill-appearing HEAD, EYES, EARS, NOSE AND THROAT: Atraumatic, normocephalic. Extraocular muscles are intact. Pupils equal and reactive to light. Sclerae anicteric. No conjunctival injection. No oro-pharyngeal erythema.  NECK: Supple. There is no jugular venous distention. No bruits, no lymphadenopathy, no thyromegaly.  HEART: Regular rate and rhythm,. No murmurs, no rubs, no clicks.  LUNGS: Clear to auscultation bilaterally. No rales or rhonchi. No wheezes.  ABDOMEN: Soft, flat, nontender, nondistended. Has good bowel sounds. No hepatosplenomegaly appreciated.  EXTREMITIES: No evidence of any cyanosis, clubbing, or peripheral edema.  +2 pedal and radial pulses bilaterally.  NEUROLOGIC: Patient is drowsy SKIN:  necrotic area below her  knee Psych: Not anxious, depressed LN: No inguinal LN enlargement    Antibiotics   Anti-infectives (From admission, onward)   Start     Dose/Rate Route Frequency Ordered Stop   02/18/2019 2200  vancomycin (VANCOCIN) 50 mg/mL oral solution 125 mg     125 mg Oral 4 times daily 02/10/2019 2130 02/26/19 2159      Medications   Scheduled Meds: . aspirin EC  81 mg Oral Daily  . atorvastatin  80 mg Oral QPM  . calcium acetate  667 mg Oral BID WC  . Chlorhexidine Gluconate Cloth  6 each Topical Daily  . collagenase  Topical Daily  . feeding supplement (NEPRO CARB STEADY)  237 mL Oral BID BM  . gentamicin cream  1 application Topical Daily  . heparin  5,000 Units Subcutaneous Q12H  . insulin aspart  0-5 Units Subcutaneous QHS  . insulin aspart  0-9 Units Subcutaneous TID WC  . midodrine  5 mg Oral TID WC  . multivitamin  1 tablet Oral QHS  . vancomycin  125 mg Oral QID   Continuous Infusions: . dialysis solution 1.5% low-MG/low-CA    . DOPamine 5 mcg/kg/min (02/19/19 1400)  . famotidine (PEPCID) IV     PRN Meds:.HYDROmorphone (DILAUDID) injection, ondansetron (ZOFRAN) IV, promethazine, traMADol   Data Review:   Micro Results Recent Results (from the past 240 hour(s))  SARS CORONAVIRUS 2 (TAT 6-24 HRS) Nasopharyngeal Nasopharyngeal Swab     Status: None   Collection Time: 02/09/2019  8:20 PM   Specimen: Nasopharyngeal Swab  Result Value Ref Range Status   SARS Coronavirus 2 NEGATIVE NEGATIVE Final    Comment: (NOTE) SARS-CoV-2 target nucleic acids are NOT DETECTED. The SARS-CoV-2 RNA is generally detectable in upper and lower respiratory specimens during the acute phase of infection. Negative results do not preclude SARS-CoV-2 infection, do not rule out co-infections with other pathogens, and should not be used as the sole basis for treatment or other patient management decisions. Negative results must be combined with clinical observations, patient history, and  epidemiological information. The expected result is Negative. Fact Sheet for Patients: SugarRoll.be Fact Sheet for Healthcare Providers: https://www.woods-mathews.com/ This test is not yet approved or cleared by the [place] FDA and  has been authorized for detection and/or diagnosis of SARS-CoV-2 by FDA under an Emergency Use Authorization (EUA). This EUA will remain  in effect (meaning this test can be used) for the duration of the COVID-19 declaration under Section 56 4(b)(1) of the Act, 21 U.S.C. section 360bbb-3(b)(1), unless the authorization is terminated or revoked sooner. Performed at [facility], [place] [address], [place], [place] [address]   Gastrointestinal Panel by PCR , Stool     Status: None   Collection Time: 03/10/2019  7:53 AM   Specimen: Stool  Result Value Ref Range Status   Campylobacter species NOT DETECTED NOT DETECTED Final   Plesimonas shigelloides NOT DETECTED NOT DETECTED Final   Salmonella species NOT DETECTED NOT DETECTED Final   Yersinia enterocolitica NOT DETECTED NOT DETECTED Final   Vibrio species NOT DETECTED NOT DETECTED Final   Vibrio cholerae NOT DETECTED NOT DETECTED Final   Enteroaggregative E coli (EAEC) NOT DETECTED NOT DETECTED Final   Enteropathogenic E coli (EPEC) NOT DETECTED NOT DETECTED Final   Enterotoxigenic E coli (ETEC) NOT DETECTED NOT DETECTED Final   Shiga like toxin producing E coli (STEC) NOT DETECTED NOT DETECTED Final   Shigella/Enteroinvasive E coli (EIEC) NOT DETECTED NOT DETECTED Final   Cryptosporidium NOT DETECTED NOT DETECTED Final   Cyclospora cayetanensis NOT DETECTED NOT DETECTED Final   Entamoeba histolytica NOT DETECTED NOT DETECTED Final   Giardia lamblia NOT DETECTED NOT DETECTED Final   Adenovirus F40/41 NOT DETECTED NOT DETECTED Final   Astrovirus NOT DETECTED NOT DETECTED Final   Norovirus GI/GII NOT DETECTED NOT DETECTED Final   Rotavirus A NOT DETECTED NOT  DETECTED Final   Sapovirus (I, II, IV, and V) NOT DETECTED NOT DETECTED Final    Comment: Performed at [facility], [address], [place], [place] [address]  C Difficile Quick Screen w PCR reflex  Status: Abnormal   Collection Time: 02/15/2019  7:53 AM   Specimen: STOOL  Result Value Ref Range Status   C Diff antigen POSITIVE (A) NEGATIVE Final   C Diff toxin NEGATIVE NEGATIVE Final   C Diff interpretation Results are indeterminate. See PCR results.  Final    Comment: Performed at [facility], [address] [place], [place] [address]  C. Diff by PCR, Reflexed     Status: Abnormal   Collection Time: 02/18/2019  7:53 AM  Result Value Ref Range Status   Toxigenic C. Difficile by PCR POSITIVE (A) NEGATIVE Final    Comment: Positive for toxigenic C. difficile with little to no toxin production. Only treat if clinical presentation suggests symptomatic illness. Performed at [facility], [address] [place], [place] [address]   MRSA PCR Screening     Status: None   Collection Time: 03/09/2019  6:12 PM   Specimen: Nasopharyngeal  Result Value Ref Range Status   MRSA by PCR NEGATIVE NEGATIVE Final    Comment:        The GeneXpert MRSA Assay (FDA approved for NASAL specimens only), is one component of a comprehensive MRSA colonization surveillance program. It is not intended to diagnose MRSA infection nor to guide or monitor treatment for MRSA infections. Performed at [facility], [address] [place], [place] [address]   CULTURE, BLOOD (ROUTINE X 2) w Reflex to ID Panel     Status: None (Preliminary result)   Collection Time: 02/17/2019 11:26 PM   Specimen: BLOOD  Result Value Ref Range Status   Specimen Description BLOOD BLOOD RIGHT FOREARM  Final   Special Requests   Final    BOTTLES DRAWN AEROBIC ONLY Blood Culture adequate volume   Culture   Final    NO GROWTH 3 DAYS Performed at [facility], [address]  [place], [place] [address]    Report Status PENDING  Incomplete  CULTURE, BLOOD (ROUTINE X 2) w Reflex to ID Panel     Status: None (Preliminary result)   Collection Time: 03/01/2019 11:27 PM   Specimen: BLOOD  Result Value Ref Range Status   Specimen Description BLOOD RAC  Final   Special Requests   Final    BOTTLES DRAWN AEROBIC ONLY Blood Culture adequate volume   Culture   Final    NO GROWTH 3 DAYS Performed at [facility], [address] [place],  [address]    Report Status PENDING  Incomplete    Radiology Reports Ct Abdomen Pelvis Wo Contrast  Result Date: 02/19/2019 CLINICAL DATA:  Unintentional weight loss.  Nausea vomiting. EXAM: CT ABDOMEN AND PELVIS WITHOUT CONTRAST TECHNIQUE: Multidetector CT imaging of the abdomen and pelvis was performed following the standard protocol without IV contrast. COMPARISON:  Right upper quadrant ultrasound 12/28/2018 FINDINGS: Lower chest: Small right pleural effusion, partially loculated. Right lower lobe dependent atelectasis. Subpleural density right middle lobe measuring 32 x 16 mm. This has progressed since the prior CT abdomen of 11/20/2018. Possible tumor versus atelectasis. Hepatobiliary: Layering calcified gallstones in the gallbladder without significant gallbladder edema. No focal liver lesion or biliary dilatation. Pancreas: Atrophic pancreas. Spleen: Negative Adrenals/Urinary Tract: No renal mass or obstruction. Urinary bladder empty. Stomach/Bowel: Stomach is distended with fluid level. No obstructing mass. Small bowel and large bowel otherwise nondilated. No bowel obstruction or edema. Mild sigmoid diverticulosis. Appendix not visualized Vascular/Lymphatic: Extensive arterial calcification secondary to atherosclerotic disease and diabetes. Right femoral artery vascular catheter with the  tip in the femoral artery. Reproductive: No uterine mass.  No pelvic mass. Other: Peritoneal catheter presumably from dialysis. Small amount of  free fluid in the abdomen. Musculoskeletal: Moderate compression fracture superior endplate of 624THL with progression since 11/20/2018. Superior endplate fracture of L4 is new since the prior CT. Multilevel spondylosis. IMPRESSION: 1. Small loculated pleural effusion. Right middle lobe masslike density measuring 32 x 16 mm. This could represent infection, atelectasis, tumor. Recommend dedicated CT chest with contrast for further evaluation. In addition, there is bibasilar atelectasis. 2. Calcified gallstones without biliary dilatation 3. Advanced atherosclerotic arterial calcification 4. Peritoneal dialysis catheter with small amount of free fluid. 5. Compression fracture T12 and L4 with progression since prior CT of 11/20/2018 Electronically Signed   By: Franchot Gallo M.D.   On: 02/19/2019 07:56   Dg Chest Port 1 View  Result Date: 02/17/2019 CLINICAL DATA:  68 year old female with hypoxia. EXAM: PORTABLE CHEST 1 VIEW COMPARISON:  Chest radiograph dated 02/17/2019 FINDINGS: Moderate right and small left pleural effusion similar or minimally increased since the earlier radiograph. Associated bibasilar densities may represent atelectasis or infiltrate. There is no pneumothorax. There is cardiomegaly with vascular congestion. Median sternotomy wires and CABG vascular clips. No acute osseous pathology. IMPRESSION: 1. Moderate right and small left pleural effusions similar or minimally increased since the earlier radiograph. Associated bibasilar densities may represent atelectasis or infiltrate. 2. Cardiomegaly with vascular congestion. Electronically Signed   By: Anner Crete M.D.   On: 02/17/2019 17:33   Dg Chest Port 1 View  Result Date: 02/17/2019 CLINICAL DATA:  Shortness of breath. EXAM: PORTABLE CHEST 1 VIEW COMPARISON:  February 15, 2019 FINDINGS: Stable postsurgical changes from CABG. Enlarged cardiac silhouette. Moderate right pleural effusion. Bilateral lower lung fields streaky opacities which  likely represent atelectasis. Airspace consolidation the right lung base cannot be excluded. Osseous structures are without acute abnormality. Soft tissues are grossly normal. IMPRESSION: 1. Moderate right pleural effusion. 2. Left lower lung field streaky opacities likely represent atelectasis. 3. Moderate right pleural effusion. Atelectasis versus airspace consolidation in the right lung base. Electronically Signed   By: Fidela Salisbury M.D.   On: 02/17/2019 10:32   Dg Chest Port 1 View  Result Date: 03/04/2019 CLINICAL DATA:  Initial evaluation for acute cough. EXAM: PORTABLE CHEST 1 VIEW COMPARISON:  Prior radiograph from 12/28/2018. FINDINGS: Median sternotomy wires underlying CABG markers and surgical clips, stable. Cardiomegaly unchanged. Mediastinal silhouette within normal limits. Right-sided hemodialysis catheter in place with tip overlying the cavoatrial junction. Lungs are hypoinflated. Small to moderate right pleural effusion with associated right basilar opacity, likely atelectasis, similar to previous. Streaky left basilar atelectatic changes also similar. Mild perihilar vascular congestion without pulmonary edema. No other new focal infiltrates. No pneumothorax. No acute osseous finding. IMPRESSION: 1. Small to moderate right pleural effusion, similar to previous. 2. Superimposed bibasilar atelectatic changes, also similar. No new focal infiltrates. 3. Cardiomegaly with mild perihilar vascular congestion without overt pulmonary edema. Electronically Signed   By: Jeannine Boga M.D.   On: 02/14/2019 22:22     CBC Recent Labs  Lab 02/14/2019 1459 03/09/2019 0143 02/19/2019 2324 02/19/19 0215  WBC 11.3* 9.5 11.7* 14.7*  HGB 12.0 11.3* 12.8 11.2*  HCT 35.6* 33.0* 39.3 33.2*  PLT 233 206 271 210  MCV 92.0 91.9 95.2 91.5  MCH 31.0 31.5 31.0 30.9  MCHC 33.7 34.2 32.6 33.7  RDW 15.7* 15.9* 16.1* 15.9*  LYMPHSABS 0.5*  --  0.5* 0.4*  MONOABS 1.1*  --  1.1* 0.9  EOSABS 0.3  --  0.2  0.0  BASOSABS 0.0  --  0.0 0.0    Chemistries  Recent Labs  Lab 03/09/2019 1459 03/08/2019 0143 02/18/2019 2324 02/17/19 0518 02/18/19 1250 02/19/19 0215  NA 132* 134* 132* 134* 133* 133*  K 2.7* 2.7* 3.7 3.8 4.2 3.6  CL 95* 98 98 103 100 98  CO2 20* 22 19* 17* 16* 19*  GLUCOSE 89 84 209* 176* 222* 179*  BUN 37* 40* 44* 44* 51* 54*  CREATININE 6.78* 6.62* 7.31* 6.87* 7.33* 7.23*  CALCIUM 6.6* 6.4* 6.6* 6.0* 7.1* 7.2*  MG 1.9  --   --   --   --  1.9  AST 27 25  --   --   --   --   ALT 22 20  --   --   --   --   ALKPHOS 342* 338*  --   --   --   --   BILITOT 0.9 0.9  --   --   --   --    ------------------------------------------------------------------------------------------------------------------ estimated creatinine clearance is 7.1 mL/min (A) (by C-G formula based on SCr of 7.23 mg/dL (H)). ------------------------------------------------------------------------------------------------------------------ No results for input(s): HGBA1C in the last 72 hours. ------------------------------------------------------------------------------------------------------------------ No results for input(s): CHOL, HDL, LDLCALC, TRIG, CHOLHDL, LDLDIRECT in the last 72 hours. ------------------------------------------------------------------------------------------------------------------ Recent Labs    02/17/19 0518  TSH 18.526*   ------------------------------------------------------------------------------------------------------------------ No results for input(s): VITAMINB12, FOLATE, FERRITIN, TIBC, IRON, RETICCTPCT in the last 72 hours.  Coagulation profile No results for input(s): INR, PROTIME in the last 168 hours.  No results for input(s): DDIMER in the last 72 hours.  Cardiac Enzymes No results for input(s): CKMB, TROPONINI, MYOGLOBIN in the last 168 hours.  Invalid input(s):  CK ------------------------------------------------------------------------------------------------------------------ Invalid input(s): POCBNP    Assessment & Plan   68 y.o. female with past medical history of ESRD on peritoneal dialysis, anemia, CAD s/p CABG x3, paroxysmal atrial fibrillation, diabetes mellitus, hypertension, hyperlipidemia, and systolic congestive heart failure presenting to the ED with chief complaints of bilateral lower extremity aching and diarrhea.  1.   Bradycardia Coreg on hold being monitored by cardiology  2. Marland Kitchenacute on chronic diarrhea - Patient being treated for C. difficile at the direction of infectious disease  3. Chronic systolic congestive heart failure - No evidence of exacerbation - EF 2019 20-25% - Holding Coreg in the setting of hypotension - On dialysis to manage fluids - Daily weights - Strict I's and O's - Low-salt diet  4.  ESRD on PD,  status post PermCath removal  5. Paroxysmal atrial fibrillation -not on anticoagulation due to anemia  6. Coronary Artery Disease s/p CABG x 3 -Continue ASA 81mg  PO daily - HTN, HLD, DM control as below  7. HLD  + Goal LDL<100 - Atorvastatin 80mg  PO qhs  8.   Hypotension-continue dopamine continue midodrine etiology of this is unclear cortisol level was normal no evidence of sepsis identified  9. Diabetes mellitus Continue sliding scale insulin  10. DVT prophylaxis - Heparin SubQ        Code Status Orders  (From admission, onward)         Start     Ordered   02/20/2019 1916  Full code  Continuous     02/22/2019 1915        Code Status History    Date Active Date Inactive Code Status Order ID Comments User Context   12/28/2018 1224 12/30/2018 1946 Full Code [id number]  Loletha Grayer, MD ED   11/20/2018 1138 11/21/2018 1913 Full Code [id number]  Harrie Foreman, MD Inpatient   05/20/2016 1932 06/14/2016 1635 Full Code [id number]  Leanor Kail, PA Inpatient   05/13/2016 1323  05/20/2016 1850 Full Code [id number]  Vaughan Basta, MD Inpatient   07/03/2015 1714 07/05/2015 1543 Full Code [id number]  Fritzi Mandes, MD Inpatient   06/26/2015 1216 06/27/2015 1914 Full Code [id number]  Wellington Hampshire, MD Inpatient   06/22/2015 0926 06/26/2015 1216 Full Code [id number]  Minna Merritts, MD Inpatient   06/19/2015 1135 06/22/2015 0926 Full Code [id number]  Hillary Bow, MD ED   Advance Care Planning Activity           Consults vascular DVT Prophylaxi,heparin  Lab Results  Component Value Date   PLT 210 02/19/2019     Time Spent in minutes   13min Greater than 50% of time spent in care coordination and counseling patient regarding the condition and plan of care.   Dustin Flock M.D on 02/19/2019 at 3:24 PM  Between 7am to 6pm - Pager - [phone]  After 6pm go to [email or web address] - Proofreader  [employer]   Office  [phone]

## 2019-02-19 NOTE — Progress Notes (Signed)
Attempted to trial patient off dopamine.  BP MAP dropped to 58.  Restarted dopamine at previous rate (5).

## 2019-02-19 NOTE — Progress Notes (Signed)
Pt removed from P D no s/s of distress sleeping, uf 159

## 2019-02-19 NOTE — Progress Notes (Signed)
Progress Note  Patient Name: Theresa Rogers Date of Encounter: 02/19/2019  Primary Cardiologist: Kathlyn Sacramento, MD   Subjective   Sleepy this morning, trying to get comfortable in bed Sores on her legs, Blood pressure borderline low 123456 systolic on low-dose dopamine dobutamine No significant arrhythmia on telemetry  Inpatient Medications    Scheduled Meds: . aspirin EC  81 mg Oral Daily  . atorvastatin  80 mg Oral QPM  . calcium acetate  667 mg Oral BID WC  . Chlorhexidine Gluconate Cloth  6 each Topical Daily  . collagenase   Topical Daily  . feeding supplement (NEPRO CARB STEADY)  237 mL Oral BID BM  . gentamicin cream  1 application Topical Daily  . heparin  5,000 Units Subcutaneous Q12H  . insulin aspart  0-5 Units Subcutaneous QHS  . insulin aspart  0-9 Units Subcutaneous TID WC  . midodrine  5 mg Oral TID WC  . multivitamin  1 tablet Oral QHS  . vancomycin  125 mg Oral QID   Continuous Infusions: . dialysis solution 1.5% low-MG/low-CA    . DOPamine 5 mcg/kg/min (02/19/19 1200)   PRN Meds: HYDROmorphone (DILAUDID) injection, ondansetron (ZOFRAN) IV, promethazine, traMADol   Vital Signs    Vitals:   02/19/19 0900 02/19/19 1000 02/19/19 1100 02/19/19 1200  BP:    118/68  Pulse: 70 71 66 70  Resp: 17 (!) 21 15 11   Temp:    97.8 F (36.6 C)  TempSrc:    Axillary  SpO2: 97% 98% 96% 100%  Weight:      Height:        Intake/Output Summary (Last 24 hours) at 02/19/2019 1246 Last data filed at 02/19/2019 1200 Gross per 24 hour  Intake 234.65 ml  Output -  Net 234.65 ml   Last 3 Weights 02/19/2019 02/18/2019 02/15/2019  Weight (lbs) 147 lb 4.3 oz 146 lb 2.6 oz 136 lb 3.9 oz  Weight (kg) 66.8 kg 66.3 kg 61.8 kg      Telemetry    Normal sinus rhythm- Personally Reviewed  ECG     - Personally Reviewed  Physical Exam   GEN:  Very ill-appearing Neck: No JVD Cardiac: RRR, no murmurs, rubs, or gallops.  Respiratory: Clear to auscultation bilaterally.  GI: Soft, nontender, non-distended  MS: No edema; No deformity. Neuro:  Nonfocal , not fully tested today Psych: Normal affect  Skin: Sores on her legs  Labs    High Sensitivity Troponin:  No results for input(s): TROPONINIHS in the last 720 hours.    Chemistry Recent Labs  Lab 02/11/2019 1459 02/24/2019 0143 03/05/2019 2324 02/17/19 0518 02/18/19 1250 02/19/19 0215  NA 132* 134* 132* 134* 133* 133*  K 2.7* 2.7* 3.7 3.8 4.2 3.6  CL 95* 98 98 103 100 98  CO2 20* 22 19* 17* 16* 19*  GLUCOSE 89 84 209* 176* 222* 179*  BUN 37* 40* 44* 44* 51* 54*  CREATININE 6.78* 6.62* 7.31* 6.87* 7.33* 7.23*  CALCIUM 6.6* 6.4* 6.6* 6.0* 7.1* 7.2*  PROT 5.8* 5.2*  --   --   --   --   ALBUMIN 2.3* 2.0* 2.3*  --   --   --   AST 27 25  --   --   --   --   ALT 22 20  --   --   --   --   ALKPHOS 342* 338*  --   --   --   --   BILITOT 0.9  0.9  --   --   --   --   GFRNONAA 6* 6* 5* 6* 5* 5*  GFRAA 7* 7* 6* 7* 6* 6*  ANIONGAP 17* 14 15 14  17* 16*     Hematology Recent Labs  Lab 03/06/2019 0143 02/17/2019 2324 02/19/19 0215  WBC 9.5 11.7* 14.7*  RBC 3.59* 4.13 3.63*  HGB 11.3* 12.8 11.2*  HCT 33.0* 39.3 33.2*  MCV 91.9 95.2 91.5  MCH 31.5 31.0 30.9  MCHC 34.2 32.6 33.7  RDW 15.9* 16.1* 15.9*  PLT 206 271 210    BNPNo results for input(s): BNP, PROBNP in the last 168 hours.   DDimer No results for input(s): DDIMER in the last 168 hours.   Radiology    Ct Abdomen Pelvis Wo Contrast  Result Date: 02/19/2019 CLINICAL DATA:  Unintentional weight loss.  Nausea vomiting. EXAM: CT ABDOMEN AND PELVIS WITHOUT CONTRAST TECHNIQUE: Multidetector CT imaging of the abdomen and pelvis was performed following the standard protocol without IV contrast. COMPARISON:  Right upper quadrant ultrasound 12/28/2018 FINDINGS: Lower chest: Small right pleural effusion, partially loculated. Right lower lobe dependent atelectasis. Subpleural density right middle lobe measuring 32 x 16 mm. This has progressed since the  prior CT abdomen of 11/20/2018. Possible tumor versus atelectasis. Hepatobiliary: Layering calcified gallstones in the gallbladder without significant gallbladder edema. No focal liver lesion or biliary dilatation. Pancreas: Atrophic pancreas. Spleen: Negative Adrenals/Urinary Tract: No renal mass or obstruction. Urinary bladder empty. Stomach/Bowel: Stomach is distended with fluid level. No obstructing mass. Small bowel and large bowel otherwise nondilated. No bowel obstruction or edema. Mild sigmoid diverticulosis. Appendix not visualized Vascular/Lymphatic: Extensive arterial calcification secondary to atherosclerotic disease and diabetes. Right femoral artery vascular catheter with the tip in the femoral artery. Reproductive: No uterine mass.  No pelvic mass. Other: Peritoneal catheter presumably from dialysis. Small amount of free fluid in the abdomen. Musculoskeletal: Moderate compression fracture superior endplate of 624THL with progression since 11/20/2018. Superior endplate fracture of L4 is new since the prior CT. Multilevel spondylosis. IMPRESSION: 1. Small loculated pleural effusion. Right middle lobe masslike density measuring 32 x 16 mm. This could represent infection, atelectasis, tumor. Recommend dedicated CT chest with contrast for further evaluation. In addition, there is bibasilar atelectasis. 2. Calcified gallstones without biliary dilatation 3. Advanced atherosclerotic arterial calcification 4. Peritoneal dialysis catheter with small amount of free fluid. 5. Compression fracture T12 and L4 with progression since prior CT of 11/20/2018 Electronically Signed   By: Franchot Gallo M.D.   On: 02/19/2019 07:56   Dg Chest Port 1 View  Result Date: 02/17/2019 CLINICAL DATA:  68 year old female with hypoxia. EXAM: PORTABLE CHEST 1 VIEW COMPARISON:  Chest radiograph dated 02/17/2019 FINDINGS: Moderate right and small left pleural effusion similar or minimally increased since the earlier radiograph.  Associated bibasilar densities may represent atelectasis or infiltrate. There is no pneumothorax. There is cardiomegaly with vascular congestion. Median sternotomy wires and CABG vascular clips. No acute osseous pathology. IMPRESSION: 1. Moderate right and small left pleural effusions similar or minimally increased since the earlier radiograph. Associated bibasilar densities may represent atelectasis or infiltrate. 2. Cardiomegaly with vascular congestion. Electronically Signed   By: Anner Crete M.D.   On: 02/17/2019 17:33    Cardiac Studies   Echo  1. The left ventricle has severely reduced systolic function, with an ejection fraction of 20-25%. The cavity size was moderately dilated. Left ventricular diastolic Doppler parameters are indeterminate. Left ventricular diffuse hypokinesis.  2. The right ventricle has  moderately reduced systolic function. The cavity was moderately enlarged. There is no increase in right ventricular wall thickness. Right ventricular systolic pressure is mildly elevated with an estimated pressure of 38.3  mmHg.  3. Left atrial size was moderately dilated.  4. Mitral valve regurgitation is mild to moderate   Patient Profile     68 y.o. female with a history of CAD s/p three-vessel CABG in 05/2016 as detailed below, HFrEF secondary to ICM, PAF no longer on Coumadin in the setting of abnormal LFT with worsening INR, ESRD on dialysis, DM2, anemia of chronic disease, hypertension, and hyperlipidemia who is being seen for the evaluation of bradycardia.  Assessment & Plan    A/P: Bradycardia, junctional escape beats Telemetry reviewed, no further significant bradycardic events -Carvedilol on hold Apart from nausea, no significant dry heaving -She still on dopamine and dobutamine -Unclear if these infusions are contributing to her nausea which seem to start after dopamine was started -Would consider weaning the pressors as blood pressure tolerates -Midodrine  increased by nephrology for blood pressure support  Dilated cardiomyopathy,  Ischemic and nonischemic component, ejection fraction severely depressed 25% Carvedilol on hold given bradycardia This could be restarted at a later date when she has recovered from hypovolemic shock, C. difficile episode fluid managed by peritoneal dialysis  Paroxysmal atrial fibrillation High risk of recurrent arrhythmia Not on anticoagulation given fall risk, LFTs abnormality Maintaining normal sinus rhythm  C. difficile colitis Hypovolemic shock on arrival with profound electrolyte abnormality Had significant fluid resuscitation,  Electrolytes repleted Now with chronic nausea Unable to exclude gastroparesis or side effect from medication as detailed above  End-stage renal disease On peritoneal dialysis Managed by nephrology   Total encounter time more than 25 minutes  Greater than 50% was spent in counseling and coordination of care with the patient   For questions or updates, please contact [facility] Please consult [email or web address] for contact info under        Signed, Ida Rogue, MD  02/19/2019, 12:46 PM

## 2019-02-19 NOTE — Progress Notes (Signed)
Pt with profound nausea, not responsive to Zofran this morning.  She also reported severe pain in right leg.  Prn Dilaudid given with satisfactory results.  Unsure if patient became lethagic afterwards or if she is withdrawn / depressed.  Due to this and her nausea, she has had very minimal PO intake of fluids or food.  Pt poorly tolerates being turned.  Dobutamine DC'd by cardiology.  Attempted to turn off dopamine, patient responded poorly AEB MAP in 50's.  PD performed today by dialysis staff.  Decreased O2 to 1L.  Was unable to successfully wean to RA.  In rounds, mentioned Zofran not as successful as should be.  Got an order for Phenergan should she need it.  Also mentioned cold extremities to ICU MD.  No further orders.  Pt remains with cold extremities.  Provided Tx to wounds per orders.

## 2019-02-19 NOTE — Progress Notes (Signed)
Pd initiated   02/19/19 0957  Cycler Setup  Total Number of Exchanges 3  Fill Volume 2000  Dianeal Solution Dextrose 1.5% in 6000 mL  Last Fill Volume 2000  Fill Time - Minute(s) 10  Dwell Time - Hour(s) 20  Dwell Time - Minute(s) 2  Drain Time - Minute(s) 14 mins  Pause Option Status Initiated  Exit Site Care Performed Yes

## 2019-02-19 NOTE — Progress Notes (Addendum)
Name: Theresa Rogers MRN: [id number] DOB: 1951/05/05    ADMISSION DATE:  02/18/2019  BRIEF PATIENT DESCRIPTION:  68 year old female with ESRD on peritoneal dialysis (hx of noncompliance with dialysis)admitted 9/7 with generalized weakness, and acute on chronic diarrhea secondary to C. Difficile.  Became hypotensive on 9/8 (likely secondary to hypovolemic shock) requiring transfer to ICU for possible pressors.  SIGNIFICANT EVENTS/STUDIES   09/7-Admission to medsurg unit with generalized weakness and acute on chronic diarrhea secondary to C. Diff  09/8-Transferred to ICU due to hypotension due to hypovolemic and component of cardiogenic shock  09/8-Removal of clotted PermCath per Vascular Surgery 09/8-ID consulted recommended treating C. Diff colitis due to chronic diarrhea with po vancomycin for 10-14 days  09/9-Unable to obtain accurate cuff blood pressure readings right femoral a-line placed 09/9-Echo revealed severely reduced systolic function with EF 20-25%, left ventricular diffuse hypokinesis, and right ventricle has moderately reduced systolic function  123456 with continued nausea/vomiting CT Abd Pelvis revealed small loculated pleural effusion. Right middle lobe mass-like density measuring 32 x 16 mm. This could represent infection, atelectasis, tumor. Recommend dedicated CT chest with contrast for further evaluation. In addition, there is bibasilar atelectasis. Calcified gallstones without biliary dilatation Advanced atherosclerotic arterial calcification Peritoneal dialysis catheter with small amount of free fluid. Compression fracture T12 and L4 with progression since prior CT of 11/20/2018  CULTURES: SARS-CoV-2 PCR 9/7>>negative MRSA PCR 9/8>>negative C. difficile by PCR 9/8>>antigen positive, toxin negative; + Toxigenic C-diff GI panel PCR 9/8>>negative Blood x2 9/8>>negative x2  ANTIBIOTICS: PO Vancomycin 9/8>>  PAST MEDICAL HISTORY :   has a past medical history of  Anemia, CAD (coronary artery disease), Chronic systolic CHF (congestive heart failure) ([place]), Complication of anesthesia, Diabetes mellitus without complication ([place]), ESRD (end stage renal disease) ([place]), HLD (hyperlipidemia), HTN (hypertension), Ischemic cardiomyopathy, Myocardial infarction ([place]) (2018), and PAF (paroxysmal atrial fibrillation) ([place]).  has a past surgical history that includes Coronary angioplasty with stent; Cardiac catheterization (N/A, 06/22/2015); Cardiac catheterization (N/A, 06/26/2015); Cardiac catheterization (N/A, 08/03/2015); Cardiac catheterization (N/A, 05/20/2016); Coronary artery bypass graft (N/A, 05/27/2016); TEE without cardioversion (N/A, 05/27/2016); ir generic historical (06/04/2016); ir generic historical (06/04/2016); Eye surgery (2018); AV fistula placement (Left, 07/09/2018); DIALYSIS/PERMA CATHETER INSERTION (N/A, 07/28/2018); Abdominal surgery; CAPD insertion (N/A, 11/19/2018); Ligation of arteriovenous  fistula (Left, 11/19/2018); and DIALYSIS/PERMA CATHETER REMOVAL (N/A, 02/19/2019).  SUBJECTIVE:  Unable to assess pt lethargic   VITAL SIGNS: Temp:  [97.7 F (36.5 C)-98.4 F (36.9 C)] 98.4 F (36.9 C) (09/11 0800) Pulse Rate:  [54-81] 71 (09/11 1000) Resp:  [11-21] 21 (09/11 1000) SpO2:  [84 %-99 %] 98 % (09/11 1000) Arterial Line BP: (90-132)/(36-54) 129/51 (09/11 1000) Weight:  [66.8 kg] 66.8 kg (09/11 0500)  PHYSICAL EXAMINATION: General: Acutely ill-appearing female, sitting in bed, on room air, no acute distress Neuro: Sleeping, arouses to voice, alert and oriented x2, follows commands HEENT: supple, no JVD  Cardiovascular: nsr, rrr, no R/G  Lungs: faint crackles throughout, even, non labored  Abdomen: hypoactive BS x4, obese, soft, non tender, LLQ peritoneal dialysis catheter dressing dry and intact   Musculoskeletal: Generalized weakness, no deformities, no edema Skin: Warm and dry, abrasion to left thigh and left malleolus  Recent Labs  Lab  02/17/19 0518 02/18/19 1250 02/19/19 0215  NA 134* 133* 133*  K 3.8 4.2 3.6  CL 103 100 98  CO2 17* 16* 19*  BUN 44* 51* 54*  CREATININE 6.87* 7.33* 7.23*  GLUCOSE 176* 222* 179*   Recent Labs  Lab 02/23/2019 0143 02/19/2019  2324 02/19/19 0215  HGB 11.3* 12.8 11.2*  HCT 33.0* 39.3 33.2*  WBC 9.5 11.7* 14.7*  PLT 206 271 210   Ct Abdomen Pelvis Wo Contrast  Result Date: 02/19/2019 CLINICAL DATA:  Unintentional weight loss.  Nausea vomiting. EXAM: CT ABDOMEN AND PELVIS WITHOUT CONTRAST TECHNIQUE: Multidetector CT imaging of the abdomen and pelvis was performed following the standard protocol without IV contrast. COMPARISON:  Right upper quadrant ultrasound 12/28/2018 FINDINGS: Lower chest: Small right pleural effusion, partially loculated. Right lower lobe dependent atelectasis. Subpleural density right middle lobe measuring 32 x 16 mm. This has progressed since the prior CT abdomen of 11/20/2018. Possible tumor versus atelectasis. Hepatobiliary: Layering calcified gallstones in the gallbladder without significant gallbladder edema. No focal liver lesion or biliary dilatation. Pancreas: Atrophic pancreas. Spleen: Negative Adrenals/Urinary Tract: No renal mass or obstruction. Urinary bladder empty. Stomach/Bowel: Stomach is distended with fluid level. No obstructing mass. Small bowel and large bowel otherwise nondilated. No bowel obstruction or edema. Mild sigmoid diverticulosis. Appendix not visualized Vascular/Lymphatic: Extensive arterial calcification secondary to atherosclerotic disease and diabetes. Right femoral artery vascular catheter with the tip in the femoral artery. Reproductive: No uterine mass.  No pelvic mass. Other: Peritoneal catheter presumably from dialysis. Small amount of free fluid in the abdomen. Musculoskeletal: Moderate compression fracture superior endplate of 624THL with progression since 11/20/2018. Superior endplate fracture of L4 is new since the prior CT. Multilevel  spondylosis. IMPRESSION: 1. Small loculated pleural effusion. Right middle lobe masslike density measuring 32 x 16 mm. This could represent infection, atelectasis, tumor. Recommend dedicated CT chest with contrast for further evaluation. In addition, there is bibasilar atelectasis. 2. Calcified gallstones without biliary dilatation 3. Advanced atherosclerotic arterial calcification 4. Peritoneal dialysis catheter with small amount of free fluid. 5. Compression fracture T12 and L4 with progression since prior CT of 11/20/2018 Electronically Signed   By: Franchot Gallo M.D.   On: 02/19/2019 07:56   Dg Chest Port 1 View  Result Date: 02/17/2019 CLINICAL DATA:  68 year old female with hypoxia. EXAM: PORTABLE CHEST 1 VIEW COMPARISON:  Chest radiograph dated 02/17/2019 FINDINGS: Moderate right and small left pleural effusion similar or minimally increased since the earlier radiograph. Associated bibasilar densities may represent atelectasis or infiltrate. There is no pneumothorax. There is cardiomegaly with vascular congestion. Median sternotomy wires and CABG vascular clips. No acute osseous pathology. IMPRESSION: 1. Moderate right and small left pleural effusions similar or minimally increased since the earlier radiograph. Associated bibasilar densities may represent atelectasis or infiltrate. 2. Cardiomegaly with vascular congestion. Electronically Signed   By: Anner Crete M.D.   On: 02/17/2019 17:33    ASSESSMENT / PLAN:  Hypotension, likely hypovolemic shock in setting of acute on chronic diarrhea and cardiogenic shock  Chronic HFrEF without acute exacerbation (EF 20 to 25%) Hx: Paroxysmal atrial fib, HTN, Hyperlipidemia Continuous cardiac monitoring Maintain map 60 or higher  Gentle IV fluids given CHF history: NS @ 50 ml/hr Hold outpatient antihypertensives  Continue midodrine Prn dopamine gtt to maintain map 60 or higher  Will not remove a-line given pts hypotension and acute encephalopathy    Patient not on anticoagulation for atrial fib due to chronic anemia  ESRD on Peritoneal Dialysis (Clotted permcath removed per vascular surgery 02/16/2019) Hypokalemia-resolved Monitor I&O's / urinary output Follow BMP Ensure adequate renal perfusion Avoid nephrotoxic agents as able Replace electrolytes as indicated Nephrology following, appreciate input ~peritoneal dialysis as per nephrology  Acute on chronic diarrhea, C. difficile antigen positive but toxin negative ~  +TOXIGENIC  C-DIFF Nausea/Vomiting may be secondary to dopamine gtt  Trend WBC's and follow curve Trend PCT  Follow cultures  Per ID recommendations continue po vancomycin 10-14 days-started 09/8  Wean dopamine as bp tolerated  Prn zofran for nausea/vomiting  Will start pepcid   Anemia of chronic disease Monitor for S/Sx of bleeding Trend CBC Subcq heparin for VTE Prophylaxis  Transfuse for Hgb <7  Diabetes mellitus CBGs SSI Follow ICU hypo-hyperglycemia protocol  -Will consult palliative care to assist with goals of treatment.  Disposition: ICU Goals of care: Full code VTE prophylaxis: Subcu heparin Updates: Updated patient at bedside 02/19/2019  Marda Stalker, [place] Pager [phone] (please enter 7 digits) [place] Pager [phone] (please enter 7 digits)     PCCM ATTENDING ATTESTATION: I have evaluated patient with the APP [place], reviewed database in its entirety and discussed care plan in detail. In addition, this patient was discussed on multidisciplinary rounds. I have personally reviewed all chest radiographs discussed herein including CXRs and CT chest unless otherwise indicated.  I agree with the above findings, assessment and plan.   Merton Border, MD PCCM service Mobile [phone] Pager [phone] 02/19/2019 4:55 PM

## 2019-02-19 NOTE — Plan of Care (Signed)

## 2019-02-19 NOTE — Progress Notes (Signed)
[facility]  ROUNDING NOTE   Subjective:   Given hydromorphone this morning, now very lethargic.   Peritoneal dialysis treatment.   UF yesterday 14mL  Dobutamine off Weaning dopamine   Objective:  Vital signs in last 24 hours:  Temp:  [97.7 F (36.5 C)-98.4 F (36.9 C)] 98.4 F (36.9 C) (09/11 0800) Pulse Rate:  [54-81] 71 (09/11 1000) Resp:  [11-21] 21 (09/11 1000) SpO2:  [84 %-99 %] 98 % (09/11 1000) Arterial Line BP: (90-132)/(36-56) 129/51 (09/11 1000) Weight:  [66.8 kg] 66.8 kg (09/11 0500)  Weight change: 0.5 kg Filed Weights   03/01/2019 1808 02/18/19 0500 02/19/19 0500  Weight: 61.8 kg 66.3 kg 66.8 kg    Intake/Output: I/O last 3 completed shifts: In: 386.3 [P.O.:120; I.V.:266.3] Out: 200 [Emesis/NG output:200]   Intake/Output this shift:  Total I/O In: 57.6 [I.V.:57.6] Out: -   Physical Exam: General: NAD, laying in bed  Head: Normocephalic, atraumatic. Moist oral mucosal membranes  Eyes: Anicteric, PERRL  Neck: Supple, trachea midline  Lungs:  Clear to auscultation  Heart: bradycardia  Abdomen:  Soft, nontender,   Extremities:  trace peripheral edema. Bilateral lower extremity wounds  Neurologic: Nonfocal, moving all four extremities  Skin: No lesions  Access: left AVF, PD catheter    Basic Metabolic Panel: Recent Labs  Lab 02/24/2019 1459 03/02/2019 0143 03/10/2019 2324 02/17/19 0518 02/18/19 1250 02/19/19 0215  NA 132* 134* 132* 134* 133* 133*  K 2.7* 2.7* 3.7 3.8 4.2 3.6  CL 95* 98 98 103 100 98  CO2 20* 22 19* 17* 16* 19*  GLUCOSE 89 84 209* 176* 222* 179*  BUN 37* 40* 44* 44* 51* 54*  CREATININE 6.78* 6.62* 7.31* 6.87* 7.33* 7.23*  CALCIUM 6.6* 6.4* 6.6* 6.0* 7.1* 7.2*  MG 1.9  --   --   --   --  1.9  PHOS  --   --  7.8* 7.5*  --  8.2*    Liver Function Tests: Recent Labs  Lab 02/15/2019 1459 02/15/2019 0143 02/10/2019 2324  AST 27 25  --   ALT 22 20  --   ALKPHOS 342* 338*  --   BILITOT 0.9 0.9  --   PROT 5.8*  5.2*  --   ALBUMIN 2.3* 2.0* 2.3*   No results for input(s): LIPASE, AMYLASE in the last 168 hours. No results for input(s): AMMONIA in the last 168 hours.  CBC: Recent Labs  Lab 03/10/2019 1459 03/04/2019 0143 02/16/19 2324 02/19/19 0215  WBC 11.3* 9.5 11.7* 14.7*  NEUTROABS 9.4*  --  9.7* 13.3*  HGB 12.0 11.3* 12.8 11.2*  HCT 35.6* 33.0* 39.3 33.2*  MCV 92.0 91.9 95.2 91.5  PLT 233 206 271 210    Cardiac Enzymes: No results for input(s): CKTOTAL, CKMB, CKMBINDEX, TROPONINI in the last 168 hours.  BNP: Invalid input(s): POCBNP  CBG: Recent Labs  Lab 02/18/19 0735 02/18/19 1201 02/18/19 1725 02/18/19 2149 02/19/19 0747  GLUCAP 121* 108* 246* 254* 105*    Microbiology: Results for orders placed or performed during the hospital encounter of 03/09/2019  SARS CORONAVIRUS 2 (TAT 6-24 HRS) Nasopharyngeal Nasopharyngeal Swab     Status: None   Collection Time: 03/06/2019  8:20 PM   Specimen: Nasopharyngeal Swab  Result Value Ref Range Status   SARS Coronavirus 2 NEGATIVE NEGATIVE Final    Comment: (NOTE) SARS-CoV-2 target nucleic acids are NOT DETECTED. The SARS-CoV-2 RNA is generally detectable in upper and lower respiratory specimens during the acute phase of infection.  Negative results do not preclude SARS-CoV-2 infection, do not rule out co-infections with other pathogens, and should not be used as the sole basis for treatment or other patient management decisions. Negative results must be combined with clinical observations, patient history, and epidemiological information. The expected result is Negative. Fact Sheet for Patients: SugarRoll.be Fact Sheet for Healthcare Providers: https://www.woods-mathews.com/ This test is not yet approved or cleared by the [place] FDA and  has been authorized for detection and/or diagnosis of SARS-CoV-2 by FDA under an Emergency Use Authorization (EUA). This EUA will remain  in effect  (meaning this test can be used) for the duration of the COVID-19 declaration under Section 56 4(b)(1) of the Act, 21 U.S.C. section 360bbb-3(b)(1), unless the authorization is terminated or revoked sooner. Performed at [facility], [place] [address], [place], [place] [address]   Gastrointestinal Panel by PCR , Stool     Status: None   Collection Time: 02/28/2019  7:53 AM   Specimen: Stool  Result Value Ref Range Status   Campylobacter species NOT DETECTED NOT DETECTED Final   Plesimonas shigelloides NOT DETECTED NOT DETECTED Final   Salmonella species NOT DETECTED NOT DETECTED Final   Yersinia enterocolitica NOT DETECTED NOT DETECTED Final   Vibrio species NOT DETECTED NOT DETECTED Final   Vibrio cholerae NOT DETECTED NOT DETECTED Final   Enteroaggregative E coli (EAEC) NOT DETECTED NOT DETECTED Final   Enteropathogenic E coli (EPEC) NOT DETECTED NOT DETECTED Final   Enterotoxigenic E coli (ETEC) NOT DETECTED NOT DETECTED Final   Shiga like toxin producing E coli (STEC) NOT DETECTED NOT DETECTED Final   Shigella/Enteroinvasive E coli (EIEC) NOT DETECTED NOT DETECTED Final   Cryptosporidium NOT DETECTED NOT DETECTED Final   Cyclospora cayetanensis NOT DETECTED NOT DETECTED Final   Entamoeba histolytica NOT DETECTED NOT DETECTED Final   Giardia lamblia NOT DETECTED NOT DETECTED Final   Adenovirus F40/41 NOT DETECTED NOT DETECTED Final   Astrovirus NOT DETECTED NOT DETECTED Final   Norovirus GI/GII NOT DETECTED NOT DETECTED Final   Rotavirus A NOT DETECTED NOT DETECTED Final   Sapovirus (I, II, IV, and V) NOT DETECTED NOT DETECTED Final    Comment: Performed at [facility], [address] [place], [place] [address]  C Difficile Quick Screen w PCR reflex     Status: Abnormal   Collection Time: 02/15/2019  7:53 AM   Specimen: STOOL  Result Value Ref Range Status   C Diff antigen POSITIVE (A) NEGATIVE Final   C Diff toxin NEGATIVE NEGATIVE Final   C Diff interpretation  Results are indeterminate. See PCR results.  Final    Comment: Performed at [facility], [address] [place], [place] [address]  C. Diff by PCR, Reflexed     Status: Abnormal   Collection Time: 02/12/2019  7:53 AM  Result Value Ref Range Status   Toxigenic C. Difficile by PCR POSITIVE (A) NEGATIVE Final    Comment: Positive for toxigenic C. difficile with little to no toxin production. Only treat if clinical presentation suggests symptomatic illness. Performed at [facility], [address] [place], [place] [address]   MRSA PCR Screening     Status: None   Collection Time: 02/20/2019  6:12 PM   Specimen: Nasopharyngeal  Result Value Ref Range Status   MRSA by PCR NEGATIVE NEGATIVE Final    Comment:        The GeneXpert MRSA Assay (FDA approved for NASAL specimens only), is one component  of a comprehensive MRSA colonization surveillance program. It is not intended to diagnose MRSA infection nor to guide or monitor treatment for MRSA infections. Performed at [facility], [address] [place], [place] [address]   CULTURE, BLOOD (ROUTINE X 2) w Reflex to ID Panel     Status: None (Preliminary result)   Collection Time: 02/25/2019 11:26 PM   Specimen: BLOOD  Result Value Ref Range Status   Specimen Description BLOOD BLOOD RIGHT FOREARM  Final   Special Requests   Final    BOTTLES DRAWN AEROBIC ONLY Blood Culture adequate volume   Culture   Final    NO GROWTH 3 DAYS Performed at [facility], [address] [place], [place] [address]    Report Status PENDING  Incomplete  CULTURE, BLOOD (ROUTINE X 2) w Reflex to ID Panel     Status: None (Preliminary result)   Collection Time: 02/14/2019 11:27 PM   Specimen: BLOOD  Result Value Ref Range Status   Specimen Description BLOOD RAC  Final   Special Requests   Final    BOTTLES DRAWN AEROBIC ONLY Blood Culture adequate volume   Culture   Final    NO GROWTH 3 DAYS Performed at [facility], [address], [place], [place] [address]    Report Status PENDING  Incomplete    Coagulation Studies: No results for input(s): LABPROT, INR in the last 72 hours.  Urinalysis: No results for input(s): COLORURINE, LABSPEC, PHURINE, GLUCOSEU, HGBUR, BILIRUBINUR, KETONESUR, PROTEINUR, UROBILINOGEN, NITRITE, LEUKOCYTESUR in the last 72 hours.  Invalid input(s): APPERANCEUR    Imaging: Ct Abdomen Pelvis Wo Contrast  Result Date: 02/19/2019 CLINICAL DATA:  Unintentional weight loss.  Nausea vomiting. EXAM: CT ABDOMEN AND PELVIS WITHOUT CONTRAST TECHNIQUE: Multidetector CT imaging of the abdomen and pelvis was performed following the standard protocol without IV contrast. COMPARISON:  Right upper quadrant ultrasound 12/28/2018 FINDINGS: Lower chest: Small right pleural effusion, partially loculated. Right lower lobe dependent atelectasis. Subpleural density right middle lobe measuring 32 x 16 mm. This has progressed since the prior CT abdomen of 11/20/2018. Possible tumor versus atelectasis. Hepatobiliary: Layering calcified gallstones in the gallbladder without significant gallbladder edema. No focal liver lesion or biliary dilatation. Pancreas: Atrophic pancreas. Spleen: Negative Adrenals/Urinary Tract: No renal mass or obstruction. Urinary bladder empty. Stomach/Bowel: Stomach is distended with fluid level. No obstructing mass. Small bowel and large bowel otherwise nondilated. No bowel obstruction or edema. Mild sigmoid diverticulosis. Appendix not visualized Vascular/Lymphatic: Extensive arterial calcification secondary to atherosclerotic disease and diabetes. Right femoral artery vascular catheter with the tip in the femoral artery. Reproductive: No uterine mass.  No pelvic mass. Other: Peritoneal catheter presumably from dialysis. Small amount of free fluid in the abdomen. Musculoskeletal: Moderate compression fracture superior endplate of 624THL with progression since 11/20/2018.  Superior endplate fracture of L4 is new since the prior CT. Multilevel spondylosis. IMPRESSION: 1. Small loculated pleural effusion. Right middle lobe masslike density measuring 32 x 16 mm. This could represent infection, atelectasis, tumor. Recommend dedicated CT chest with contrast for further evaluation. In addition, there is bibasilar atelectasis. 2. Calcified gallstones without biliary dilatation 3. Advanced atherosclerotic arterial calcification 4. Peritoneal dialysis catheter with small amount of free fluid. 5. Compression fracture T12 and L4 with progression since prior CT of 11/20/2018 Electronically Signed   By: Franchot Gallo M.D.   On: 02/19/2019 07:56   Dg Chest Port 1 View  Result Date: 02/17/2019 CLINICAL DATA:  68 year old female with hypoxia. EXAM: PORTABLE  CHEST 1 VIEW COMPARISON:  Chest radiograph dated 02/17/2019 FINDINGS: Moderate right and small left pleural effusion similar or minimally increased since the earlier radiograph. Associated bibasilar densities may represent atelectasis or infiltrate. There is no pneumothorax. There is cardiomegaly with vascular congestion. Median sternotomy wires and CABG vascular clips. No acute osseous pathology. IMPRESSION: 1. Moderate right and small left pleural effusions similar or minimally increased since the earlier radiograph. Associated bibasilar densities may represent atelectasis or infiltrate. 2. Cardiomegaly with vascular congestion. Electronically Signed   By: Anner Crete M.D.   On: 02/17/2019 17:33     Medications:   . dialysis solution 1.5% low-MG/low-CA    . DOPamine 5 mcg/kg/min (02/19/19 0900)   . aspirin EC  81 mg Oral Daily  . atorvastatin  80 mg Oral QPM  . calcium acetate  667 mg Oral BID WC  . Chlorhexidine Gluconate Cloth  6 each Topical Daily  . collagenase   Topical Daily  . feeding supplement (NEPRO CARB STEADY)  237 mL Oral BID BM  . gentamicin cream  1 application Topical Daily  . heparin  5,000 Units  Subcutaneous Q8H  . insulin aspart  0-5 Units Subcutaneous QHS  . insulin aspart  0-9 Units Subcutaneous TID WC  . midodrine  10 mg Oral TID WC  . multivitamin  1 tablet Oral QHS  . vancomycin  125 mg Oral QID   HYDROmorphone (DILAUDID) injection, ondansetron (ZOFRAN) IV, traMADol  Assessment/ Plan:  [name] is a 68 y.o. white female with end stage renal disease on peritoneal dialysis, hypertension, congestive heart failure, coronary artery disease, diabetes mellitus type II  [facility] 61kg Peritoneal dialysis CCPD 9 hours 5 exchanges 2 liter fills and last fill of 570mL.   1. End Stage Renal Disease - Seen and examined on peritoneal dialysis treatment. 8 hours 3 exchanges and 2 liter fills due to hypotension. Dextrose 1.5%.   2. Hypotension: with cardiogenic shock and hypovolemic shock holding home regimen of furosemide, hydralazine and carvedilol.  Requiring vasopressors Appreciate cardiology input Increased dose of midodrine.   3. Anemia of chronic kidney disease: hemoglobin 11.2 - epo as outpatient.   4. Secondary Hyperparathyroidism: with hyperphosphatemia.  With new finding of vertebral fractures.  Outpatient labs on 9/2 phos 8, calcium 6.8, PTH 262 - calcium acetate with meals.   5. Hypokalemia: improved.  - Potassium supplementation.  - Hold furosemide.   6. Pulmonary nodule: new finding - Reviewed with Dr. Alva Garnet, pulmonology. Not consistent with malignancy.    LOS: [place] 9/11/202010:19 AM

## 2019-02-20 ENCOUNTER — Inpatient Hospital Stay: Payer: Medicare Other

## 2019-02-20 ENCOUNTER — Encounter: Payer: Self-pay | Admitting: Adult Health

## 2019-02-20 LAB — MAGNESIUM: Magnesium: 1.9 mg/dL (ref 1.7–2.4)

## 2019-02-20 LAB — BLOOD GAS, ARTERIAL
Acid-base deficit: 9.3 mmol/L — ABNORMAL HIGH (ref 0.0–2.0)
Bicarbonate: 17.6 mmol/L — ABNORMAL LOW (ref 20.0–28.0)
FIO2: 0.4
O2 Saturation: 95.7 %
Patient temperature: 37
pCO2 arterial: 41 mmHg (ref 32.0–48.0)
pH, Arterial: 7.24 — ABNORMAL LOW (ref 7.350–7.450)
pO2, Arterial: 93 mmHg (ref 83.0–108.0)

## 2019-02-20 LAB — CBC WITH DIFFERENTIAL/PLATELET
Abs Immature Granulocytes: 0.08 10*3/uL — ABNORMAL HIGH (ref 0.00–0.07)
Basophils Absolute: 0 10*3/uL (ref 0.0–0.1)
Basophils Relative: 0 %
Eosinophils Absolute: 0 10*3/uL (ref 0.0–0.5)
Eosinophils Relative: 0 %
HCT: 32.3 % — ABNORMAL LOW (ref 36.0–46.0)
Hemoglobin: 11 g/dL — ABNORMAL LOW (ref 12.0–15.0)
Immature Granulocytes: 1 %
Lymphocytes Relative: 2 %
Lymphs Abs: 0.2 10*3/uL — ABNORMAL LOW (ref 0.7–4.0)
MCH: 30.6 pg (ref 26.0–34.0)
MCHC: 34.1 g/dL (ref 30.0–36.0)
MCV: 90 fL (ref 80.0–100.0)
Monocytes Absolute: 0.6 10*3/uL (ref 0.1–1.0)
Monocytes Relative: 4 %
Neutro Abs: 13.4 10*3/uL — ABNORMAL HIGH (ref 1.7–7.7)
Neutrophils Relative %: 93 %
Platelets: 198 10*3/uL (ref 150–400)
RBC: 3.59 MIL/uL — ABNORMAL LOW (ref 3.87–5.11)
RDW: 15.9 % — ABNORMAL HIGH (ref 11.5–15.5)
WBC: 14.4 10*3/uL — ABNORMAL HIGH (ref 4.0–10.5)
nRBC: 0.5 % — ABNORMAL HIGH (ref 0.0–0.2)

## 2019-02-20 LAB — BASIC METABOLIC PANEL
Anion gap: 15 (ref 5–15)
BUN: 62 mg/dL — ABNORMAL HIGH (ref 8–23)
CO2: 18 mmol/L — ABNORMAL LOW (ref 22–32)
Calcium: 7.1 mg/dL — ABNORMAL LOW (ref 8.9–10.3)
Chloride: 100 mmol/L (ref 98–111)
Creatinine, Ser: 6.96 mg/dL — ABNORMAL HIGH (ref 0.44–1.00)
GFR calc Af Amer: 6 mL/min — ABNORMAL LOW (ref >60)
GFR calc non Af Amer: 6 mL/min — ABNORMAL LOW (ref >60)
Glucose, Bld: 187 mg/dL — ABNORMAL HIGH (ref 70–99)
Potassium: 4.7 mmol/L (ref 3.5–5.1)
Sodium: 133 mmol/L — ABNORMAL LOW (ref 135–145)

## 2019-02-20 LAB — GLUCOSE, CAPILLARY
Glucose-Capillary: 41 mg/dL — CL (ref 70–99)
Glucose-Capillary: 190 mg/dL — ABNORMAL HIGH (ref 70–99)
Glucose-Capillary: 313 mg/dL — ABNORMAL HIGH (ref 70–99)
Glucose-Capillary: 113 mg/dL — ABNORMAL HIGH (ref 70–99)
Glucose-Capillary: 167 mg/dL — ABNORMAL HIGH (ref 70–99)
Glucose-Capillary: 38 mg/dL — CL (ref 70–99)

## 2019-02-20 LAB — COMPREHENSIVE METABOLIC PANEL
ALT: 42 U/L (ref 0–44)
AST: 121 U/L — ABNORMAL HIGH (ref 15–41)
Albumin: 1.8 g/dL — ABNORMAL LOW (ref 3.5–5.0)
Alkaline Phosphatase: 271 U/L — ABNORMAL HIGH (ref 38–126)
Anion gap: 16 — ABNORMAL HIGH (ref 5–15)
BUN: 61 mg/dL — ABNORMAL HIGH (ref 8–23)
CO2: 17 mmol/L — ABNORMAL LOW (ref 22–32)
Calcium: 6.7 mg/dL — ABNORMAL LOW (ref 8.9–10.3)
Chloride: 100 mmol/L (ref 98–111)
Creatinine, Ser: 6.64 mg/dL — ABNORMAL HIGH (ref 0.44–1.00)
GFR calc Af Amer: 7 mL/min — ABNORMAL LOW (ref >60)
GFR calc non Af Amer: 6 mL/min — ABNORMAL LOW (ref >60)
Glucose, Bld: 206 mg/dL — ABNORMAL HIGH (ref 70–99)
Potassium: 4.3 mmol/L (ref 3.5–5.1)
Sodium: 133 mmol/L — ABNORMAL LOW (ref 135–145)
Total Bilirubin: 1.1 mg/dL (ref 0.3–1.2)
Total Protein: 4.9 g/dL — ABNORMAL LOW (ref 6.5–8.1)

## 2019-02-20 LAB — CBC
HCT: 33.3 % — ABNORMAL LOW (ref 36.0–46.0)
Hemoglobin: 11.3 g/dL — ABNORMAL LOW (ref 12.0–15.0)
MCH: 30.7 pg (ref 26.0–34.0)
MCHC: 33.9 g/dL (ref 30.0–36.0)
MCV: 90.5 fL (ref 80.0–100.0)
Platelets: 205 10*3/uL (ref 150–400)
RBC: 3.68 MIL/uL — ABNORMAL LOW (ref 3.87–5.11)
RDW: 16.2 % — ABNORMAL HIGH (ref 11.5–15.5)
WBC: 15.4 10*3/uL — ABNORMAL HIGH (ref 4.0–10.5)
nRBC: 1.6 % — ABNORMAL HIGH (ref 0.0–0.2)

## 2019-02-20 LAB — FIBRINOGEN: Fibrinogen: 408 mg/dL (ref 210–475)

## 2019-02-20 LAB — FIBRIN DERIVATIVES D-DIMER (ARMC ONLY): Fibrin derivatives D-dimer (ARMC): 2235.89 ng/mL (FEU) — ABNORMAL HIGH (ref 0.00–499.00)

## 2019-02-20 LAB — HEMOGLOBIN A1C
Hgb A1c MFr Bld: 9.7 % — ABNORMAL HIGH (ref 4.8–5.6)
Mean Plasma Glucose: 232 mg/dL

## 2019-02-20 LAB — LACTIC ACID, PLASMA: Lactic Acid, Venous: 3.3 mmol/L (ref 0.5–1.9)

## 2019-02-20 LAB — PHOSPHORUS: Phosphorus: 8.4 mg/dL — ABNORMAL HIGH (ref 2.5–4.6)

## 2019-02-20 LAB — PROTIME-INR
INR: 1.2 (ref 0.8–1.2)
Prothrombin Time: 15.5 seconds — ABNORMAL HIGH (ref 11.4–15.2)

## 2019-02-20 MED ORDER — MIDODRINE HCL 5 MG PO TABS
10.0000 mg | ORAL_TABLET | Freq: Three times a day (TID) | ORAL | Status: DC
  Filled 2019-02-20 (×4): qty 2

## 2019-02-20 MED ORDER — DEXTROSE 50 % IV SOLN
25.0000 g | INTRAVENOUS | Status: AC
  Administered 2019-02-20: 08:00:00 50 mL via INTRAVENOUS

## 2019-02-20 MED ORDER — SODIUM CHLORIDE 0.9 % IV SOLN
INTRAVENOUS | Status: AC
  Administered 2019-02-20: 10:00:00 via INTRAVENOUS

## 2019-02-20 MED ORDER — MORPHINE SULFATE (PF) 2 MG/ML IV SOLN
INTRAVENOUS | Status: AC
  Administered 2019-02-20: 2 mg via INTRAVENOUS
  Filled 2019-02-20: qty 1

## 2019-02-20 MED ORDER — CHLORHEXIDINE GLUCONATE 0.12 % MT SOLN: 15.0000 mL | Freq: Two times a day (BID) | OROMUCOSAL | Status: DC

## 2019-02-20 MED ORDER — NOREPINEPHRINE 4 MG/250ML-% IV SOLN: 0.0000 ug/min | INTRAVENOUS | Status: DC

## 2019-02-20 MED ORDER — SODIUM CHLORIDE 0.9 % IV SOLN
0.0000 ug/min | INTRAVENOUS | Status: DC
  Filled 2019-02-20: qty 1

## 2019-02-20 MED ORDER — DEXTROSE 50 % IV SOLN
50.0000 mL | Freq: Once | INTRAVENOUS | Status: AC
  Administered 2019-02-20: 22:00:00 50 mL via INTRAVENOUS

## 2019-02-20 MED ORDER — MORPHINE SULFATE (PF) 2 MG/ML IV SOLN
2.0000 mg | INTRAVENOUS | Status: DC | PRN
  Administered 2019-02-20: 23:00:00 2 mg via INTRAVENOUS

## 2019-02-20 MED ORDER — DEXTROSE 50 % IV SOLN
INTRAVENOUS | Status: AC
  Administered 2019-02-20: 50 mL via INTRAVENOUS
  Filled 2019-02-20: qty 50

## 2019-02-20 MED ORDER — LORAZEPAM 2 MG/ML IJ SOLN
1.0000 mg | INTRAMUSCULAR | Status: DC | PRN
  Administered 2019-02-20: 1 mg via INTRAVENOUS

## 2019-02-20 MED ORDER — SODIUM BICARBONATE 8.4 % IV SOLN
100.0000 meq | Freq: Once | INTRAVENOUS | Status: AC
  Administered 2019-02-20: 100 meq via INTRAVENOUS
  Filled 2019-02-20: qty 100

## 2019-02-20 MED ORDER — LORAZEPAM 2 MG/ML IJ SOLN
1.0000 mg | INTRAMUSCULAR | Status: DC | PRN
  Filled 2019-02-20: qty 1

## 2019-02-20 MED ORDER — ORAL CARE MOUTH RINSE: 15.0000 mL | Freq: Two times a day (BID) | OROMUCOSAL | Status: DC

## 2019-02-20 MED ORDER — FAMOTIDINE 20 MG PO TABS: 20.0000 mg | ORAL_TABLET | Freq: Every day | ORAL | Status: DC

## 2019-02-20 NOTE — Progress Notes (Signed)
Hypoglycemic Event  CBG: 41  Treatment: D50 50 mL (25 gm)  Symptoms: Pale, Sweaty and Shaky  Follow-up CBG: [id number] CBG Result:190  Possible Reasons for Event: Other: Previous and subsequent CBG readings suggest an error in collection leading to an inaccurately low measurement  Comments/MD notified: MD aware    Theresa Rogers

## 2019-02-20 NOTE — Progress Notes (Signed)
PHARMACIST - PHYSICIAN COMMUNICATION  CONCERNING: Antibiotic IV to Oral Route Change Policy  RECOMMENDATION: This patient is receiving famotidine by the intravenous route.  Based on criteria approved by the Pharmacy and Therapeutics Committee, the antibiotic(s) is/are being converted to the equivalent oral dose form(s).   DESCRIPTION: These criteria include:  Patient being treated for a respiratory tract infection, urinary tract infection, cellulitis or clostridium difficile associated diarrhea if on metronidazole  The patient is not neutropenic and does not exhibit a GI malabsorption state  The patient is eating (either orally or via tube) and/or has been taking other orally administered medications for a least 24 hours  The patient is improving clinically and has a Tmax < 100.5  If you have questions about this conversion, please contact the Pharmacy Department at [phone].  MLS, RPh

## 2019-02-20 NOTE — Progress Notes (Signed)
PD STARTED  

## 2019-02-20 NOTE — Progress Notes (Signed)
[place] at [facility]                                                                                                                                                                                  Patient Demographics   Theresa Rogers, is a 68 y.o. female, DOB - [birth year], [id number]  Admit date - 03/09/2019   Admitting Physician Lang Snow, NP  Outpatient Primary MD for the patient is Marygrace Drought, MD   LOS - 5  Subjective: Patient continues to be confused this morning. Not answering questions.  Review of Systems:   CONSTITUTIONAL: Limited due to confusion   Vitals:   Vitals:   02/20/19 0700 02/20/19 0800 02/20/19 0821 02/20/19 0900  BP:      Pulse: 64 65  (!) 58  Resp: 12 13  10   Temp:   (!) 97.5 F (36.4 C)   TempSrc:   Oral   SpO2: 93% 95%  92%  Weight:      Height:        Wt Readings from Last 3 Encounters:  02/20/19 64.9 kg  12/28/18 64.4 kg  11/21/18 65.1 kg     Intake/Output Summary (Last 24 hours) at 02/20/2019 1501 Last data filed at 02/20/2019 0600 Gross per 24 hour  Intake 150.77 ml  Output -  Net 150.77 ml    Physical Exam:   GENERAL: Chronically ill-appearing, laying in bed, in NAD HEENT: Atraumatic, normocephalic. Extraocular muscles are intact. Pupils equal and reactive to light. Sclerae anicteric. No conjunctival injection. No oro-pharyngeal erythema.  NECK: Supple. There is no jugular venous distention. No bruits, no lymphadenopathy, no thyromegaly.  HEART: Regular rate and rhythm,. No murmurs, no rubs, no clicks.  LUNGS: Clear to auscultation bilaterally. No rales or rhonchi. No wheezes.  ABDOMEN: Soft, flat, nontender, nondistended. Has good bowel sounds. No hepatosplenomegaly appreciated.  EXTREMITIES: No evidence of any cyanosis, clubbing, or peripheral edema.  +2 pedal and radial pulses bilaterally.  NEUROLOGIC: Alert, does not answer questions SKIN: No rashes. Psych: Alert, does not  answer questions of orientation LN: No inguinal LN enlargement    Antibiotics   Anti-infectives (From admission, onward)   Start     Dose/Rate Route Frequency Ordered Stop   03/06/2019 2200  vancomycin (VANCOCIN) 50 mg/mL oral solution 125 mg     125 mg Oral 4 times daily 02/12/2019 2130 02/26/19 2159      Medications   Scheduled Meds: . aspirin EC  81 mg Oral Daily  . atorvastatin  80 mg Oral QPM  . calcium acetate  667 mg Oral BID WC  . Chlorhexidine Gluconate Cloth  6 each Topical Daily  .  collagenase   Topical Daily  . famotidine  20 mg Oral QHS  . feeding supplement (NEPRO CARB STEADY)  237 mL Oral BID BM  . gentamicin cream  1 application Topical Daily  . heparin  5,000 Units Subcutaneous Q12H  . insulin aspart  0-5 Units Subcutaneous QHS  . insulin aspart  0-9 Units Subcutaneous TID WC  . midodrine  10 mg Oral TID WC  . multivitamin  1 tablet Oral QHS  . vancomycin  125 mg Oral QID   Continuous Infusions: . sodium chloride 100 mL/hr at 02/20/19 0958  . dialysis solution 1.5% low-MG/low-CA    . DOPamine 7.5 mcg/kg/min (02/20/19 1005)   PRN Meds:.HYDROmorphone (DILAUDID) injection, ondansetron (ZOFRAN) IV, promethazine, traMADol   Data Review:   Micro Results Recent Results (from the past 240 hour(s))  SARS CORONAVIRUS 2 (TAT 6-24 HRS) Nasopharyngeal Nasopharyngeal Swab     Status: None   Collection Time: 02/14/2019  8:20 PM   Specimen: Nasopharyngeal Swab  Result Value Ref Range Status   SARS Coronavirus 2 NEGATIVE NEGATIVE Final    Comment: (NOTE) SARS-CoV-2 target nucleic acids are NOT DETECTED. The SARS-CoV-2 RNA is generally detectable in upper and lower respiratory specimens during the acute phase of infection. Negative results do not preclude SARS-CoV-2 infection, do not rule out co-infections with other pathogens, and should not be used as the sole basis for treatment or other patient management decisions. Negative results must be combined with clinical  observations, patient history, and epidemiological information. The expected result is Negative. Fact Sheet for Patients: SugarRoll.be Fact Sheet for Healthcare Providers: https://www.woods-mathews.com/ This test is not yet approved or cleared by the [place] FDA and  has been authorized for detection and/or diagnosis of SARS-CoV-2 by FDA under an Emergency Use Authorization (EUA). This EUA will remain  in effect (meaning this test can be used) for the duration of the COVID-19 declaration under Section 56 4(b)(1) of the Act, 21 U.S.C. section 360bbb-3(b)(1), unless the authorization is terminated or revoked sooner. Performed at [facility], [place] [address], [place], [place] [address]   Gastrointestinal Panel by PCR , Stool     Status: None   Collection Time: 02/14/2019  7:53 AM   Specimen: Stool  Result Value Ref Range Status   Campylobacter species NOT DETECTED NOT DETECTED Final   Plesimonas shigelloides NOT DETECTED NOT DETECTED Final   Salmonella species NOT DETECTED NOT DETECTED Final   Yersinia enterocolitica NOT DETECTED NOT DETECTED Final   Vibrio species NOT DETECTED NOT DETECTED Final   Vibrio cholerae NOT DETECTED NOT DETECTED Final   Enteroaggregative E coli (EAEC) NOT DETECTED NOT DETECTED Final   Enteropathogenic E coli (EPEC) NOT DETECTED NOT DETECTED Final   Enterotoxigenic E coli (ETEC) NOT DETECTED NOT DETECTED Final   Shiga like toxin producing E coli (STEC) NOT DETECTED NOT DETECTED Final   Shigella/Enteroinvasive E coli (EIEC) NOT DETECTED NOT DETECTED Final   Cryptosporidium NOT DETECTED NOT DETECTED Final   Cyclospora cayetanensis NOT DETECTED NOT DETECTED Final   Entamoeba histolytica NOT DETECTED NOT DETECTED Final   Giardia lamblia NOT DETECTED NOT DETECTED Final   Adenovirus F40/41 NOT DETECTED NOT DETECTED Final   Astrovirus NOT DETECTED NOT DETECTED Final   Norovirus GI/GII NOT DETECTED NOT DETECTED  Final   Rotavirus A NOT DETECTED NOT DETECTED Final   Sapovirus (I, II, IV, and V) NOT DETECTED NOT DETECTED Final    Comment: Performed at [facility], [address] [place],  [place] [address]  C Difficile Quick Screen w PCR reflex     Status: Abnormal   Collection Time: 02/20/2019  7:53 AM   Specimen: STOOL  Result Value Ref Range Status   C Diff antigen POSITIVE (A) NEGATIVE Final   C Diff toxin NEGATIVE NEGATIVE Final   C Diff interpretation Results are indeterminate. See PCR results.  Final    Comment: Performed at [facility], [address] [place], [place] [address]  C. Diff by PCR, Reflexed     Status: Abnormal   Collection Time: 02/28/2019  7:53 AM  Result Value Ref Range Status   Toxigenic C. Difficile by PCR POSITIVE (A) NEGATIVE Final    Comment: Positive for toxigenic C. difficile with little to no toxin production. Only treat if clinical presentation suggests symptomatic illness. Performed at [facility], [address] [place], [place] [address]   MRSA PCR Screening     Status: None   Collection Time: 02/24/2019  6:12 PM   Specimen: Nasopharyngeal  Result Value Ref Range Status   MRSA by PCR NEGATIVE NEGATIVE Final    Comment:        The GeneXpert MRSA Assay (FDA approved for NASAL specimens only), is one component of a comprehensive MRSA colonization surveillance program. It is not intended to diagnose MRSA infection nor to guide or monitor treatment for MRSA infections. Performed at [facility], [address] [place], [place] [address]   CULTURE, BLOOD (ROUTINE X 2) w Reflex to ID Panel     Status: None (Preliminary result)   Collection Time: 02/12/2019 11:26 PM   Specimen: BLOOD  Result Value Ref Range Status   Specimen Description BLOOD BLOOD RIGHT FOREARM  Final   Special Requests   Final    BOTTLES DRAWN AEROBIC ONLY Blood Culture adequate volume   Culture   Final    NO GROWTH 4 DAYS Performed at [facility], [address], [place], [place] [address]    Report Status PENDING  Incomplete  CULTURE, BLOOD (ROUTINE X 2) w Reflex to ID Panel     Status: None (Preliminary result)   Collection Time: 02/20/2019 11:27 PM   Specimen: BLOOD  Result Value Ref Range Status   Specimen Description BLOOD RAC  Final   Special Requests   Final    BOTTLES DRAWN AEROBIC ONLY Blood Culture adequate volume   Culture   Final    NO GROWTH 4 DAYS Performed at [facility] [place], [address] [place], [place] [address]    Report Status PENDING  Incomplete    Radiology Reports Ct Abdomen Pelvis Wo Contrast  Result Date: 02/19/2019 CLINICAL DATA:  Unintentional weight loss.  Nausea vomiting. EXAM: CT ABDOMEN AND PELVIS WITHOUT CONTRAST TECHNIQUE: Multidetector CT imaging of the abdomen and pelvis was performed following the standard protocol without IV contrast. COMPARISON:  Right upper quadrant ultrasound 12/28/2018 FINDINGS: Lower chest: Small right pleural effusion, partially loculated. Right lower lobe dependent atelectasis. Subpleural density right middle lobe measuring 32 x 16 mm. This has progressed since the prior CT abdomen of 11/20/2018. Possible tumor versus atelectasis. Hepatobiliary: Layering calcified gallstones in the gallbladder without significant gallbladder edema. No focal liver lesion or biliary dilatation. Pancreas: Atrophic pancreas. Spleen: Negative Adrenals/Urinary Tract: No renal mass or obstruction. Urinary bladder empty. Stomach/Bowel: Stomach is distended with fluid level. No obstructing mass. Small bowel and large bowel otherwise nondilated. No bowel obstruction or edema. Mild sigmoid diverticulosis. Appendix not visualized Vascular/Lymphatic: Extensive arterial  calcification secondary to atherosclerotic disease and diabetes. Right femoral artery vascular catheter with the tip in the femoral artery. Reproductive: No uterine mass.  No pelvic mass. Other: Peritoneal catheter  presumably from dialysis. Small amount of free fluid in the abdomen. Musculoskeletal: Moderate compression fracture superior endplate of 624THL with progression since 11/20/2018. Superior endplate fracture of L4 is new since the prior CT. Multilevel spondylosis. IMPRESSION: 1. Small loculated pleural effusion. Right middle lobe masslike density measuring 32 x 16 mm. This could represent infection, atelectasis, tumor. Recommend dedicated CT chest with contrast for further evaluation. In addition, there is bibasilar atelectasis. 2. Calcified gallstones without biliary dilatation 3. Advanced atherosclerotic arterial calcification 4. Peritoneal dialysis catheter with small amount of free fluid. 5. Compression fracture T12 and L4 with progression since prior CT of 11/20/2018 Electronically Signed   By: Franchot Gallo M.D.   On: 02/19/2019 07:56   Dg Chest Port 1 View  Result Date: 02/17/2019 CLINICAL DATA:  68 year old female with hypoxia. EXAM: PORTABLE CHEST 1 VIEW COMPARISON:  Chest radiograph dated 02/17/2019 FINDINGS: Moderate right and small left pleural effusion similar or minimally increased since the earlier radiograph. Associated bibasilar densities may represent atelectasis or infiltrate. There is no pneumothorax. There is cardiomegaly with vascular congestion. Median sternotomy wires and CABG vascular clips. No acute osseous pathology. IMPRESSION: 1. Moderate right and small left pleural effusions similar or minimally increased since the earlier radiograph. Associated bibasilar densities may represent atelectasis or infiltrate. 2. Cardiomegaly with vascular congestion. Electronically Signed   By: Anner Crete M.D.   On: 02/17/2019 17:33   Dg Chest Port 1 View  Result Date: 02/17/2019 CLINICAL DATA:  Shortness of breath. EXAM: PORTABLE CHEST 1 VIEW COMPARISON:  February 15, 2019 FINDINGS: Stable postsurgical changes from CABG. Enlarged cardiac silhouette. Moderate right pleural effusion. Bilateral lower  lung fields streaky opacities which likely represent atelectasis. Airspace consolidation the right lung base cannot be excluded. Osseous structures are without acute abnormality. Soft tissues are grossly normal. IMPRESSION: 1. Moderate right pleural effusion. 2. Left lower lung field streaky opacities likely represent atelectasis. 3. Moderate right pleural effusion. Atelectasis versus airspace consolidation in the right lung base. Electronically Signed   By: Fidela Salisbury M.D.   On: 02/17/2019 10:32   Dg Chest Port 1 View  Result Date: 02/20/2019 CLINICAL DATA:  Initial evaluation for acute cough. EXAM: PORTABLE CHEST 1 VIEW COMPARISON:  Prior radiograph from 12/28/2018. FINDINGS: Median sternotomy wires underlying CABG markers and surgical clips, stable. Cardiomegaly unchanged. Mediastinal silhouette within normal limits. Right-sided hemodialysis catheter in place with tip overlying the cavoatrial junction. Lungs are hypoinflated. Small to moderate right pleural effusion with associated right basilar opacity, likely atelectasis, similar to previous. Streaky left basilar atelectatic changes also similar. Mild perihilar vascular congestion without pulmonary edema. No other new focal infiltrates. No pneumothorax. No acute osseous finding. IMPRESSION: 1. Small to moderate right pleural effusion, similar to previous. 2. Superimposed bibasilar atelectatic changes, also similar. No new focal infiltrates. 3. Cardiomegaly with mild perihilar vascular congestion without overt pulmonary edema. Electronically Signed   By: Jeannine Boga M.D.   On: 03/09/2019 22:22     CBC Recent Labs  Lab 03/02/2019 1459 02/11/2019 0143 02/27/2019 2324 02/19/19 0215 02/20/19 0424  WBC 11.3* 9.5 11.7* 14.7* 14.4*  HGB 12.0 11.3* 12.8 11.2* 11.0*  HCT 35.6* 33.0* 39.3 33.2* 32.3*  PLT 233 206 271 210 198  MCV 92.0 91.9 95.2 91.5 90.0  MCH 31.0 31.5 31.0 30.9 30.6  MCHC 33.7 34.2  32.6 33.7 34.1  RDW 15.7* 15.9* 16.1*  15.9* 15.9*  LYMPHSABS 0.5*  --  0.5* 0.4* 0.2*  MONOABS 1.1*  --  1.1* 0.9 0.6  EOSABS 0.3  --  0.2 0.0 0.0  BASOSABS 0.0  --  0.0 0.0 0.0    Chemistries  Recent Labs  Lab 03/01/2019 1459 03/10/2019 0143 02/22/2019 2324 02/17/19 0518 02/18/19 1250 02/19/19 0215 02/20/19 0424  NA 132* 134* 132* 134* 133* 133* 133*  K 2.7* 2.7* 3.7 3.8 4.2 3.6 4.7  CL 95* 98 98 103 100 98 100  CO2 20* 22 19* 17* 16* 19* 18*  GLUCOSE 89 84 209* 176* 222* 179* 187*  BUN 37* 40* 44* 44* 51* 54* 62*  CREATININE 6.78* 6.62* 7.31* 6.87* 7.33* 7.23* 6.96*  CALCIUM 6.6* 6.4* 6.6* 6.0* 7.1* 7.2* 7.1*  MG 1.9  --   --   --   --  1.9  --   AST 27 25  --   --   --   --   --   ALT 22 20  --   --   --   --   --   ALKPHOS 342* 338*  --   --   --   --   --   BILITOT 0.9 0.9  --   --   --   --   --    ------------------------------------------------------------------------------------------------------------------ estimated creatinine clearance is 6.8 mL/min (A) (by C-G formula based on SCr of 6.96 mg/dL (H)). ------------------------------------------------------------------------------------------------------------------ Recent Labs    02/19/19 0500  HGBA1C 9.7*   ------------------------------------------------------------------------------------------------------------------ No results for input(s): CHOL, HDL, LDLCALC, TRIG, CHOLHDL, LDLDIRECT in the last 72 hours. ------------------------------------------------------------------------------------------------------------------ No results for input(s): TSH, T4TOTAL, T3FREE, THYROIDAB in the last 72 hours.  Invalid input(s): FREET3 ------------------------------------------------------------------------------------------------------------------ No results for input(s): VITAMINB12, FOLATE, FERRITIN, TIBC, IRON, RETICCTPCT in the last 72 hours.  Coagulation profile No results for input(s): INR, PROTIME in the last 168 hours.  No results for input(s):  DDIMER in the last 72 hours.  Cardiac Enzymes No results for input(s): CKMB, TROPONINI, MYOGLOBIN in the last 168 hours.  Invalid input(s): CK ------------------------------------------------------------------------------------------------------------------ Invalid input(s): POCBNP    Assessment & Plan   Acute respiratory failure with hypoxia- due to bilateral pleural effusions and acute on chronic systolic heart failure.  Remains on 2.5 L O2 this morning. Recent ECHO with EF 20-25%. -Will need to monitor respiratory status closely with starting IVFs today -Volume management per nephrology -Wean O2 as able  Hypotension due to cardiogenic and hypovolemic shock. Cortisol level normal. -Continue midodrine -Off dopamine today -IVFs started by nephrology today  Bradycardia with a history of paroxysmal atrial fibrillation- improving -Continue holding coreg -Not on anticoagulation due to anemia -Cardiac monitoring   C. Diff colitis- improving -Continue po vancomycin  ESRD on PD -Will hold on further PD until tonight per nephrology -Continue phoslo  Coronary Artery Disease s/p CABG x 3 -Continue aspirin  Hyperlipidemia -Continue Lipitor  Type 2 diabetes -Continue SSI  Anemia of chronic kidney disease- hemoglobin at baseline -Monitor  DVT prophylaxis- Heparin      Code Status Orders  (From admission, onward)         Start     Ordered   02/19/2019 1916  Full code  Continuous     02/19/2019 1915        Code Status History    Date Active Date Inactive Code Status Order ID Comments User Context   12/28/2018 1224 12/30/2018 1946 Full Code [id number]  Loletha Grayer, MD ED   11/20/2018 1138 11/21/2018 1913 Full Code [id number]  Harrie Foreman, MD Inpatient   05/20/2016 1932 06/14/2016 1635 Full Code [id number]  Leanor Kail, PA Inpatient   05/13/2016 1323 05/20/2016 1850 Full Code [id number]  Vaughan Basta, MD Inpatient   07/03/2015 1714 07/05/2015 1543  Full Code [id number]  Fritzi Mandes, MD Inpatient   06/26/2015 1216 06/27/2015 1914 Full Code [id number]  Wellington Hampshire, MD Inpatient   06/22/2015 0926 06/26/2015 1216 Full Code [id number]  Minna Merritts, MD Inpatient   06/19/2015 1135 06/22/2015 0926 Full Code [id number]  Hillary Bow, MD ED   Advance Care Planning Activity           Consults vascular DVT Prophylaxi,heparin  Lab Results  Component Value Date   PLT 198 02/20/2019     Time Spent in minutes   8min Greater than 50% of time spent in care coordination and counseling patient regarding the condition and plan of care.   Berna Spare Dwaine Pringle M.D on 02/20/2019 at 3:01 PM  Between 7am to 6pm - Pager - [phone]  After 6pm go to [email or web address] - Proofreader  [employer]   Office  [phone]

## 2019-02-20 NOTE — Progress Notes (Signed)
Progress Note   Subjective   Lethargic, not very interactive,  Does not provide history  Inpatient Medications    Scheduled Meds: . aspirin EC  81 mg Oral Daily  . atorvastatin  80 mg Oral QPM  . calcium acetate  667 mg Oral BID WC  . Chlorhexidine Gluconate Cloth  6 each Topical Daily  . collagenase   Topical Daily  . famotidine  20 mg Oral QHS  . feeding supplement (NEPRO CARB STEADY)  237 mL Oral BID BM  . gentamicin cream  1 application Topical Daily  . heparin  5,000 Units Subcutaneous Q12H  . insulin aspart  0-5 Units Subcutaneous QHS  . insulin aspart  0-9 Units Subcutaneous TID WC  . midodrine  10 mg Oral TID WC  . multivitamin  1 tablet Oral QHS  . vancomycin  125 mg Oral QID   Continuous Infusions: . sodium chloride 100 mL/hr at 02/20/19 0958  . dialysis solution 1.5% low-MG/low-CA    . DOPamine 7.5 mcg/kg/min (02/20/19 1005)   PRN Meds: HYDROmorphone (DILAUDID) injection, ondansetron (ZOFRAN) IV, promethazine, traMADol   Vital Signs    Vitals:   02/20/19 0700 02/20/19 0800 02/20/19 0821 02/20/19 0900  BP:      Pulse: 64 65  (!) 58  Resp: 12 13  10   Temp:   (!) 97.5 F (36.4 C)   TempSrc:   Oral   SpO2: 93% 95%  92%  Weight:      Height:        Intake/Output Summary (Last 24 hours) at 02/20/2019 1355 Last data filed at 02/20/2019 0600 Gross per 24 hour  Intake 163.2 ml  Output -  Net 163.2 ml   Filed Weights   02/18/19 0500 02/19/19 0500 02/20/19 0500  Weight: 66.3 kg 66.8 kg 64.9 kg    Telemetry    Sinus, V rates 70s - Personally Reviewed  Physical Exam   GEN- The patient is chronically ill and frail appearing, lethargic,  Unable to provide history Head- normocephalic, atraumatic Eyes-  Sclera clear, conjunctiva pink Ears- hearing intact Oropharynx- clear Neck- supple, Lungs-  normal work of breathing Heart- Regular rate and rhythm  GI- soft  Extremities- no clubbing, cyanosis, or edema  MS- diffuse atrophy Skin- pressure  ulcers on heals noted Psych- lethargic Neuro- lethargic   Labs    Chemistry Recent Labs  Lab 02/20/2019 1459 02/22/2019 0143 02/11/2019 2324  02/18/19 1250 02/19/19 0215 02/20/19 0424  NA 132* 134* 132*   < > 133* 133* 133*  K 2.7* 2.7* 3.7   < > 4.2 3.6 4.7  CL 95* 98 98   < > 100 98 100  CO2 20* 22 19*   < > 16* 19* 18*  GLUCOSE 89 84 209*   < > 222* 179* 187*  BUN 37* 40* 44*   < > 51* 54* 62*  CREATININE 6.78* 6.62* 7.31*   < > 7.33* 7.23* 6.96*  CALCIUM 6.6* 6.4* 6.6*   < > 7.1* 7.2* 7.1*  PROT 5.8* 5.2*  --   --   --   --   --   ALBUMIN 2.3* 2.0* 2.3*  --   --   --   --   AST 27 25  --   --   --   --   --   ALT 22 20  --   --   --   --   --   ALKPHOS 342* 338*  --   --   --   --   --  BILITOT 0.9 0.9  --   --   --   --   --   GFRNONAA 6* 6* 5*   < > 5* 5* 6*  GFRAA 7* 7* 6*   < > 6* 6* 6*  ANIONGAP 17* 14 15   < > 17* 16* 15   < > = values in this interval not displayed.     Hematology Recent Labs  Lab 02/25/2019 2324 02/19/19 0215 02/20/19 0424  WBC 11.7* 14.7* 14.4*  RBC 4.13 3.63* 3.59*  HGB 12.8 11.2* 11.0*  HCT 39.3 33.2* 32.3*  MCV 95.2 91.5 90.0  MCH 31.0 30.9 30.6  MCHC 32.6 33.7 34.1  RDW 16.1* 15.9* 15.9*  PLT 271 210 198    Patient Profile     68 y.o.femalewith a history of CAD s/p three-vessel CABG in 05/2016, HFrEF secondary to ICM, PAF no longer on Coumadin in the setting of abnormal LFT with worsening INR, ESRD on dialysis, DM2, anemia of chronic disease, hypertension, and hyperlipidemia who is being seen for the evaluation of bradycardia.   Assessment & Plan    1.  Bradycardia resolved Coreg on hold Stop dopamine  2. Dilated cardiomyopathy Combined ischemic and nonischemic Admitted with hypovolemic in the setting of C difficile Continue to hold diuretics and CHF medicines May be able to resume them soon (once off midodrine) Caution with IVF  3. Paroxysmal atrial fibrillation In sinus rhythm  4. ESRD on peritoneal dialysis   Very fragile and chronically ill appearing.  Would consider palliative care consultation Cardiology to see as needed over the weekend   Thompson Grayer MD, [facility] 02/20/2019 1:55 PM

## 2019-02-20 NOTE — Progress Notes (Signed)
   Name: Theresa Rogers MRN: [id number] DOB: [birth year]    ADMISSION DATE:  02/14/2019  BRIEF PATIENT DESCRIPTION:  68 year old female with ESRD on peritoneal dialysis (hx of noncompliance with dialysis)admitted 9/7 with generalized weakness, and acute on chronic diarrhea secondary to C. Difficile.  Became hypotensive on 9/8 (likely secondary to hypovolemic shock) requiring transfer to ICU for possible pressors.  SIGNIFICANT EVENTS/STUDIES   09/7-Admission to medsurg unit with generalized weakness and acute on chronic diarrhea secondary to C. Diff  09/8-Transferred to ICU due to hypotension due to hypovolemic and component of cardiogenic shock  09/8-Removal of clotted PermCath per Vascular Surgery 09/8-ID consulted recommended treating C. Diff colitis due to chronic diarrhea with po vancomycin for 10-14 days  09/9-Unable to obtain accurate cuff blood pressure readings right femoral a-line placed 09/9-Echo revealed severely reduced systolic function with EF 20-25%, left ventricular diffuse hypokinesis, and right ventricle has moderately reduced systolic function  [id number] with continued nausea/vomiting CT Abd Pelvis revealed small loculated pleural effusion. Right middle lobe mass-like density measuring 32 x 16 mm. This could represent infection, atelectasis, tumor. Recommend dedicated CT chest with contrast for further evaluation. In addition, there is bibasilar atelectasis. Calcified gallstones without biliary dilatation Advanced atherosclerotic arterial calcification Peritoneal dialysis catheter with small amount of free fluid. Compression fracture T12 and L4 with progression since prior CT of 11/20/2018  CULTURES: SARS-CoV-2 PCR 9/7>>negative MRSA PCR 9/8>>negative C. difficile by PCR 9/8>>antigen positive, toxin negative; + Toxigenic C-diff GI panel PCR 9/8>>negative Blood x2 9/8>>negative x2  ANTIBIOTICS: PO Vancomycin 9/8 >>  SUBJECTIVE:  Poorly oriented.  Intermittently somnolent.   No overt respiratory distress.  VITAL SIGNS: Temp:  [97.5 F (36.4 C)-98.2 F (36.8 C)] 97.5 F (36.4 C) (09/12 0821) Pulse Rate:  [58-75] 58 (09/12 0900) Resp:  [6-23] 10 (09/12 0900) BP: (41-90)/(30-70) 41/30 (09/11 1800) SpO2:  [89 %-98 %] 92 % (09/12 0900) Arterial Line BP: (89-124)/(37-57) 104/48 (09/12 0900) Weight:  [64.9 kg] 64.9 kg (09/12 0500)  PHYSICAL EXAMINATION: General: Chronically ill-appearing, frail, NAD Neuro: Cranial nerves intact, moves all extremities HEENT: NCAT, sclerae white Cardiovascular: Regular, no M Lungs: Clear anteriorly Abdomen: Soft, + BS, NT Extremities: Cool, no edema Skin: Generalized erythema  Recent Labs  Lab 02/18/19 1250 02/19/19 0215 02/20/19 0424  NA 133* 133* 133*  K 4.2 3.6 4.7  CL 100 98 100  CO2 16* 19* 18*  BUN 51* 54* 62*  CREATININE 7.33* 7.23* 6.96*  GLUCOSE 222* 179* 187*   Recent Labs  Lab 02/11/2019 2324 02/19/19 0215 02/20/19 0424  HGB 12.8 11.2* 11.0*  HCT 39.3 33.2* 32.3*  WBC 11.7* 14.7* 14.4*  PLT 271 210 198   CXR: No new film  ASSESSMENT: Acute hypoxemic respiratory failure R>L bilateral effusions Dilated CM with LVEF 20-25% Hypotension requiring dopamine H/O PAF ESRD on chronic PD C diff colitis Chronic anemia without overt blood loss DM 2 with labile CBGs Generalized frailty with guarded long term prognosis  PLAN/REC: Continue supplemental oxygen as needed to maintain SPO2 >90% Wean dopamine to off as tolerated for MAP goal >60 mmHg PD per nephrology Cont enteral vancomycin DC arterial catheter Cont SSI Palliative Care Service to see - she is a very poor candidate for intubation or ACLS   Merton Border, MD PCCM service Mobile [phone] Pager [phone] 02/20/2019 2:42 PM

## 2019-02-20 NOTE — Progress Notes (Signed)
[facility]  ROUNDING NOTE   Subjective:   Alert to self and place only.   Peritoneal dialysis treatment.   UF yesterday 157mL  Weaning dopamine via peripheral line  Objective:  Vital signs in last 24 hours:  Temp:  [97.5 F (36.4 C)-98.2 F (36.8 C)] 97.5 F (36.4 C) (09/12 0821) Pulse Rate:  [58-75] 63 (09/12 0600) Resp:  [6-23] 16 (09/12 0600) BP: (41-135)/(30-76) 41/30 (09/11 1800) SpO2:  [89 %-100 %] 95 % (09/12 0500) Arterial Line BP: (86-129)/(37-57) 100/43 (09/12 0600) Weight:  [64.9 kg] 64.9 kg (09/12 0500)  Weight change: -1.9 kg Filed Weights   02/18/19 0500 02/19/19 0500 02/20/19 0500  Weight: 66.3 kg 66.8 kg 64.9 kg    Intake/Output: I/O last 3 completed shifts: In: 357.3 [I.V.:307.2; IV Piggyback:50.1] Out: -    Intake/Output this shift:  No intake/output data recorded.  Physical Exam: General: NAD, laying in bed  Head: Normocephalic, atraumatic. Moist oral mucosal membranes  Eyes: Anicteric, PERRL  Neck: Supple, trachea midline  Lungs:  Clear to auscultation  Heart: bradycardia  Abdomen:  Soft, nontender,   Extremities:  trace peripheral edema. Bilateral lower extremity wounds  Neurologic: Nonfocal, moving all four extremities  Skin: No lesions  Access: left AVF, PD catheter    Basic Metabolic Panel: Recent Labs  Lab 02/11/2019 1459  02/15/2019 2324 02/17/19 0518 02/18/19 1250 02/19/19 0215 02/20/19 0424  NA 132*   < > 132* 134* 133* 133* 133*  K 2.7*   < > 3.7 3.8 4.2 3.6 4.7  CL 95*   < > 98 103 100 98 100  CO2 20*   < > 19* 17* 16* 19* 18*  GLUCOSE 89   < > 209* 176* 222* 179* 187*  BUN 37*   < > 44* 44* 51* 54* 62*  CREATININE 6.78*   < > 7.31* 6.87* 7.33* 7.23* 6.96*  CALCIUM 6.6*   < > 6.6* 6.0* 7.1* 7.2* 7.1*  MG 1.9  --   --   --   --  1.9  --   PHOS  --   --  7.8* 7.5*  --  8.2*  --    < > = values in this interval not displayed.    Liver Function Tests: Recent Labs  Lab 02/11/2019 1459 02/10/2019 0143  02/22/2019 2324  AST 27 25  --   ALT 22 20  --   ALKPHOS 342* 338*  --   BILITOT 0.9 0.9  --   PROT 5.8* 5.2*  --   ALBUMIN 2.3* 2.0* 2.3*   No results for input(s): LIPASE, AMYLASE in the last 168 hours. No results for input(s): AMMONIA in the last 168 hours.  CBC: Recent Labs  Lab 03/10/2019 1459 02/25/2019 0143 03/08/2019 2324 02/19/19 0215 02/20/19 0424  WBC 11.3* 9.5 11.7* 14.7* 14.4*  NEUTROABS 9.4*  --  9.7* 13.3* 13.4*  HGB 12.0 11.3* 12.8 11.2* 11.0*  HCT 35.6* 33.0* 39.3 33.2* 32.3*  MCV 92.0 91.9 95.2 91.5 90.0  PLT 233 206 271 210 198    Cardiac Enzymes: No results for input(s): CKTOTAL, CKMB, CKMBINDEX, TROPONINI in the last 168 hours.  BNP: Invalid input(s): POCBNP  CBG: Recent Labs  Lab 02/19/19 0747 02/19/19 1120 02/19/19 1620 02/19/19 2143 02/20/19 0800  GLUCAP 105* 96 172* 162* 41*    Microbiology: Results for orders placed or performed during the hospital encounter of 02/11/2019  SARS CORONAVIRUS 2 (TAT 6-24 HRS) Nasopharyngeal Nasopharyngeal Swab     Status:  None   Collection Time: 03/09/2019  8:20 PM   Specimen: Nasopharyngeal Swab  Result Value Ref Range Status   SARS Coronavirus 2 NEGATIVE NEGATIVE Final    Comment: (NOTE) SARS-CoV-2 target nucleic acids are NOT DETECTED. The SARS-CoV-2 RNA is generally detectable in upper and lower respiratory specimens during the acute phase of infection. Negative results do not preclude SARS-CoV-2 infection, do not rule out co-infections with other pathogens, and should not be used as the sole basis for treatment or other patient management decisions. Negative results must be combined with clinical observations, patient history, and epidemiological information. The expected result is Negative. Fact Sheet for Patients: SugarRoll.be Fact Sheet for Healthcare Providers: https://www.woods-mathews.com/ This test is not yet approved or cleared by the [place] FDA  and  has been authorized for detection and/or diagnosis of SARS-CoV-2 by FDA under an Emergency Use Authorization (EUA). This EUA will remain  in effect (meaning this test can be used) for the duration of the COVID-19 declaration under Section 56 4(b)(1) of the Act, 21 U.S.C. section 360bbb-3(b)(1), unless the authorization is terminated or revoked sooner. Performed at [facility], [place] [address], [place], [place] [address]   Gastrointestinal Panel by PCR , Stool     Status: None   Collection Time: 02/19/2019  7:53 AM   Specimen: Stool  Result Value Ref Range Status   Campylobacter species NOT DETECTED NOT DETECTED Final   Plesimonas shigelloides NOT DETECTED NOT DETECTED Final   Salmonella species NOT DETECTED NOT DETECTED Final   Yersinia enterocolitica NOT DETECTED NOT DETECTED Final   Vibrio species NOT DETECTED NOT DETECTED Final   Vibrio cholerae NOT DETECTED NOT DETECTED Final   Enteroaggregative E coli (EAEC) NOT DETECTED NOT DETECTED Final   Enteropathogenic E coli (EPEC) NOT DETECTED NOT DETECTED Final   Enterotoxigenic E coli (ETEC) NOT DETECTED NOT DETECTED Final   Shiga like toxin producing E coli (STEC) NOT DETECTED NOT DETECTED Final   Shigella/Enteroinvasive E coli (EIEC) NOT DETECTED NOT DETECTED Final   Cryptosporidium NOT DETECTED NOT DETECTED Final   Cyclospora cayetanensis NOT DETECTED NOT DETECTED Final   Entamoeba histolytica NOT DETECTED NOT DETECTED Final   Giardia lamblia NOT DETECTED NOT DETECTED Final   Adenovirus F40/41 NOT DETECTED NOT DETECTED Final   Astrovirus NOT DETECTED NOT DETECTED Final   Norovirus GI/GII NOT DETECTED NOT DETECTED Final   Rotavirus A NOT DETECTED NOT DETECTED Final   Sapovirus (I, II, IV, and V) NOT DETECTED NOT DETECTED Final    Comment: Performed at [facility], [address] [place], [place] [address]  C Difficile Quick Screen w PCR reflex     Status: Abnormal   Collection Time: 02/10/2019  7:53 AM    Specimen: STOOL  Result Value Ref Range Status   C Diff antigen POSITIVE (A) NEGATIVE Final   C Diff toxin NEGATIVE NEGATIVE Final   C Diff interpretation Results are indeterminate. See PCR results.  Final    Comment: Performed at [facility], [address] [place], [place] [address]  C. Diff by PCR, Reflexed     Status: Abnormal   Collection Time: 02/22/2019  7:53 AM  Result Value Ref Range Status   Toxigenic C. Difficile by PCR POSITIVE (A) NEGATIVE Final    Comment: Positive for toxigenic C. difficile with little to no toxin production. Only treat if clinical presentation suggests symptomatic illness. Performed at [facility], [address], [place], [place] [address]   MRSA  PCR Screening     Status: None   Collection Time: 02/20/2019  6:12 PM   Specimen: Nasopharyngeal  Result Value Ref Range Status   MRSA by PCR NEGATIVE NEGATIVE Final    Comment:        The GeneXpert MRSA Assay (FDA approved for NASAL specimens only), is one component of a comprehensive MRSA colonization surveillance program. It is not intended to diagnose MRSA infection nor to guide or monitor treatment for MRSA infections. Performed at [facility], [address] [place], [place] [address]   CULTURE, BLOOD (ROUTINE X 2) w Reflex to ID Panel     Status: None (Preliminary result)   Collection Time: 02/18/2019 11:26 PM   Specimen: BLOOD  Result Value Ref Range Status   Specimen Description BLOOD BLOOD RIGHT FOREARM  Final   Special Requests   Final    BOTTLES DRAWN AEROBIC ONLY Blood Culture adequate volume   Culture   Final    NO GROWTH 4 DAYS Performed at [facility], [address], [place], [place] [address]    Report Status PENDING  Incomplete  CULTURE, BLOOD (ROUTINE X 2) w Reflex to ID Panel     Status: None (Preliminary result)   Collection Time: 02/28/2019 11:27 PM   Specimen: BLOOD  Result Value Ref Range Status   Specimen Description BLOOD RAC   Final   Special Requests   Final    BOTTLES DRAWN AEROBIC ONLY Blood Culture adequate volume   Culture   Final    NO GROWTH 4 DAYS Performed at [facility], [address], [place], [place] [address]    Report Status PENDING  Incomplete    Coagulation Studies: No results for input(s): LABPROT, INR in the last 72 hours.  Urinalysis: No results for input(s): COLORURINE, LABSPEC, PHURINE, GLUCOSEU, HGBUR, BILIRUBINUR, KETONESUR, PROTEINUR, UROBILINOGEN, NITRITE, LEUKOCYTESUR in the last 72 hours.  Invalid input(s): APPERANCEUR    Imaging: Ct Abdomen Pelvis Wo Contrast  Result Date: 02/19/2019 CLINICAL DATA:  Unintentional weight loss.  Nausea vomiting. EXAM: CT ABDOMEN AND PELVIS WITHOUT CONTRAST TECHNIQUE: Multidetector CT imaging of the abdomen and pelvis was performed following the standard protocol without IV contrast. COMPARISON:  Right upper quadrant ultrasound 12/28/2018 FINDINGS: Lower chest: Small right pleural effusion, partially loculated. Right lower lobe dependent atelectasis. Subpleural density right middle lobe measuring 32 x 16 mm. This has progressed since the prior CT abdomen of 11/20/2018. Possible tumor versus atelectasis. Hepatobiliary: Layering calcified gallstones in the gallbladder without significant gallbladder edema. No focal liver lesion or biliary dilatation. Pancreas: Atrophic pancreas. Spleen: Negative Adrenals/Urinary Tract: No renal mass or obstruction. Urinary bladder empty. Stomach/Bowel: Stomach is distended with fluid level. No obstructing mass. Small bowel and large bowel otherwise nondilated. No bowel obstruction or edema. Mild sigmoid diverticulosis. Appendix not visualized Vascular/Lymphatic: Extensive arterial calcification secondary to atherosclerotic disease and diabetes. Right femoral artery vascular catheter with the tip in the femoral artery. Reproductive: No uterine mass.  No pelvic mass. Other: Peritoneal catheter presumably from  dialysis. Small amount of free fluid in the abdomen. Musculoskeletal: Moderate compression fracture superior endplate of 624THL with progression since 11/20/2018. Superior endplate fracture of L4 is new since the prior CT. Multilevel spondylosis. IMPRESSION: 1. Small loculated pleural effusion. Right middle lobe masslike density measuring 32 x 16 mm. This could represent infection, atelectasis, tumor. Recommend dedicated CT chest with contrast for further evaluation. In addition, there is bibasilar atelectasis. 2. Calcified gallstones without biliary dilatation 3. Advanced atherosclerotic arterial  calcification 4. Peritoneal dialysis catheter with small amount of free fluid. 5. Compression fracture T12 and L4 with progression since prior CT of 11/20/2018 Electronically Signed   By: Franchot Gallo M.D.   On: 02/19/2019 07:56     Medications:   . sodium chloride    . dialysis solution 1.5% low-MG/low-CA    . DOPamine 10 mcg/kg/min (02/20/19 0600)  . famotidine (PEPCID) IV Stopped (02/19/19 2036)   . aspirin EC  81 mg Oral Daily  . atorvastatin  80 mg Oral QPM  . calcium acetate  667 mg Oral BID WC  . Chlorhexidine Gluconate Cloth  6 each Topical Daily  . collagenase   Topical Daily  . feeding supplement (NEPRO CARB STEADY)  237 mL Oral BID BM  . gentamicin cream  1 application Topical Daily  . heparin  5,000 Units Subcutaneous Q12H  . insulin aspart  0-5 Units Subcutaneous QHS  . insulin aspart  0-9 Units Subcutaneous TID WC  . midodrine  5 mg Oral TID WC  . multivitamin  1 tablet Oral QHS  . vancomycin  125 mg Oral QID   HYDROmorphone (DILAUDID) injection, ondansetron (ZOFRAN) IV, promethazine, traMADol  Assessment/ Plan:  [name] is a 68 y.o. white female with end stage renal disease on peritoneal dialysis, hypertension, congestive heart failure, coronary artery disease, diabetes mellitus type II  [facility] 61kg Peritoneal dialysis CCPD 9 hours 5 exchanges 2 liter  fills and last fill of 518mL.   1. End Stage Renal Disease:  Peritoneal dialysis for later tonight. Orders prepared. Prescription of Peritoneal dialysis treatment. 8 hours 3 exchanges and 2 liter fills due to hypotension. Dextrose 1.5%.  - Start IV normal saline infusion  2. Hypotension: with cardiogenic shock and hypovolemic shock holding home regimen of furosemide, hydralazine and carvedilol.  Requiring vasopressors - on dopamine Appreciate cardiology input - Increased dose of midodrine.   3. Anemia of chronic kidney disease:   - epo as outpatient.   4. Secondary Hyperparathyroidism: with hyperphosphatemia.  With new finding of vertebral fractures.  Outpatient labs on 9/2 phos 8, calcium 6.8, PTH 262 - calcium acetate with meals.   5. Hypokalemia: improved.  - Potassium supplementation.  - Hold furosemide.    LOS: 5 Savannah Morford 9/12/20209:17 AM

## 2019-02-21 LAB — CULTURE, BLOOD (ROUTINE X 2)
Culture: NO GROWTH
Culture: NO GROWTH
Special Requests: ADEQUATE
Special Requests: ADEQUATE

## 2019-02-21 MED ORDER — BIOTENE DRY MOUTH MT LIQD: 15.0000 mL | OROMUCOSAL | Status: DC | PRN

## 2019-02-21 MED ORDER — ONDANSETRON 4 MG PO TBDP
4.0000 mg | ORAL_TABLET | Freq: Four times a day (QID) | ORAL | Status: DC | PRN
  Filled 2019-02-21: qty 1

## 2019-02-21 MED ORDER — GLYCOPYRROLATE 0.2 MG/ML IJ SOLN: 0.2000 mg | INTRAMUSCULAR | Status: DC | PRN

## 2019-02-21 MED ORDER — ACETAMINOPHEN 650 MG RE SUPP: 650.0000 mg | Freq: Four times a day (QID) | RECTAL | Status: DC | PRN

## 2019-02-21 MED ORDER — ONDANSETRON HCL 4 MG/2ML IJ SOLN: 4.0000 mg | Freq: Four times a day (QID) | INTRAMUSCULAR | Status: DC | PRN

## 2019-02-21 MED ORDER — ACETAMINOPHEN 325 MG PO TABS: 650.0000 mg | ORAL_TABLET | Freq: Four times a day (QID) | ORAL | Status: DC | PRN

## 2019-02-21 MED ORDER — HYDROMORPHONE HCL 1 MG/ML IJ SOLN: 0.5000 mg | INTRAMUSCULAR | Status: DC | PRN

## 2019-02-21 MED ORDER — GLYCOPYRROLATE 1 MG PO TABS
1.0000 mg | ORAL_TABLET | ORAL | Status: DC | PRN
  Filled 2019-02-21: qty 1

## 2019-02-21 MED ORDER — POLYVINYL ALCOHOL 1.4 % OP SOLN
1.0000 [drp] | Freq: Four times a day (QID) | OPHTHALMIC | Status: DC | PRN
  Filled 2019-02-21: qty 15

## 2019-02-22 LAB — ANCA TITERS
Atypical P-ANCA titer: <1:20 {titer}
C-ANCA: <1:20 {titer}
P-ANCA: <1:20 {titer}

## 2019-03-02 ENCOUNTER — Telehealth: Payer: Self-pay | Admitting: Internal Medicine

## 2019-03-02 ENCOUNTER — Telehealth: Payer: Self-pay

## 2019-03-02 NOTE — Telephone Encounter (Signed)
Death certificate has been received by [employer] and placed in [employer].

## 2019-03-02 NOTE — Telephone Encounter (Signed)
Received call from [place] with [employer] regarding death certificate.  Vernell Barrier stated that she contaced ICU and was advised that Dr. Alva Garnet would not be singing death certificate. Per our records death certificate was not brought to our office for signature. Vernell Barrier confirmed this information during our conversation. Dr. Alva Garnet was in fact in ICU during time of pt's death, due to this I have requested that Vernell Barrier bring death certificate by our office and I will request that another provider sign.  Vernell Barrier stated that she would bring death certificate today prior to 5:00p.

## 2019-03-03 NOTE — Telephone Encounter (Signed)
Death certificate has been completed and placed up front for pick up.  Theresa Rogers with [facility] is aware and voiced his understanding. Nothing further is needed.

## 2019-03-10 ENCOUNTER — Telehealth: Payer: Self-pay | Admitting: Internal Medicine

## 2019-03-10 NOTE — Telephone Encounter (Signed)
Death certificate has been placed in Dr. Kasa's folder.    

## 2019-03-11 NOTE — Progress Notes (Signed)
Pt found to be increasingly solement throughout shift with increased work of breathing and decreasing blood pressure while maxed out on dopamine. NP notified of deteriorating condition and placed stat orders for labs and ABG. Pt daughter contacted as well and had goals of care meeting with NP. Choice was made by daughter to make Pt DNR/DNI and to move towards comfort care. Daughter spent time with pt and chaplain visited to provide family comfort. All daughters's questions answered. Will continue to monitor.

## 2019-03-11 NOTE — Death Summary Note (Signed)
DEATH SUMMARY   Patient Details  Name: Theresa Rogers MRN: [id number] DOB: 1951/01/23  Admission/Discharge Information   Admit Date:  2019/03/17  Date of Death: Date of Death: 03/23/2019  Time of Death: Time of Death: 0835  Length of Stay: 2022/09/13  Referring Physician: Marygrace Drought, MD   Reason(s) for Hospitalization  Acute hypoxic respiratory failure, septic and cardiogenic shock, C. difficile colitis and bilateral pleural effusions  Diagnoses  Preliminary cause of death: Refractory septic and cardiogenic shock  Secondary Diagnoses (including complications and co-morbidities):  Active Problems:   Hypokalemia Acute hypoxemic respiratory failure R>L bilateral effusions Dilated CM with LVEF 20-25% Hypotension requiring dopamine H/O PAF ESRD on chronic PD C diff colitis Chronic anemia without overt blood loss DM 2 with labile CBGs Generalized frailty with guarded long term prognosis  Brief Hospital Course (including significant findings, care, treatment, and services provided and events leading to death)  Rhona Alsip is a 68 y.o. year old female  with ESRD on peritoneal dialysis (hx of noncompliance with dialysis)admitted 2023-03-17 with generalized weakness, and acute on chronic diarrhea secondary to C. Difficile.  Became hypotensive on 9/8 (likely secondary to hypovolemic shock) requiring transfer to ICU for possible pressors.  Despite treatment, patient continues to decline.  This evening, patient became completely unresponsive, hypotensive and hypoxic.  Patient's daughter/POA was notified and after discussing patient's prognosis in light of her current comorbidities, the daughter indicated that she did not want patient to suffer any further.  Patient was made a DNR/DNI with no escalation of care with the plan of transitioning to comfort care should she continue to decline.  This morning, patient became agonal with systolic blood pressures in the 60s.  Patient's daughter was notified and  she opted to transition patient to full comfort care.  End-of-life orders were placed and patient expired peacefully  Pertinent Labs and Studies  Significant Diagnostic Studies Ct Abdomen Pelvis Wo Contrast  Result Date: 02/19/2019 CLINICAL DATA:  Unintentional weight loss.  Nausea vomiting. EXAM: CT ABDOMEN AND PELVIS WITHOUT CONTRAST TECHNIQUE: Multidetector CT imaging of the abdomen and pelvis was performed following the standard protocol without IV contrast. COMPARISON:  Right upper quadrant ultrasound 12/28/2018 FINDINGS: Lower chest: Small right pleural effusion, partially loculated. Right lower lobe dependent atelectasis. Subpleural density right middle lobe measuring 32 x 16 mm. This has progressed since the prior CT abdomen of 11/20/2018. Possible tumor versus atelectasis. Hepatobiliary: Layering calcified gallstones in the gallbladder without significant gallbladder edema. No focal liver lesion or biliary dilatation. Pancreas: Atrophic pancreas. Spleen: Negative Adrenals/Urinary Tract: No renal mass or obstruction. Urinary bladder empty. Stomach/Bowel: Stomach is distended with fluid level. No obstructing mass. Small bowel and large bowel otherwise nondilated. No bowel obstruction or edema. Mild sigmoid diverticulosis. Appendix not visualized Vascular/Lymphatic: Extensive arterial calcification secondary to atherosclerotic disease and diabetes. Right femoral artery vascular catheter with the tip in the femoral artery. Reproductive: No uterine mass.  No pelvic mass. Other: Peritoneal catheter presumably from dialysis. Small amount of free fluid in the abdomen. Musculoskeletal: Moderate compression fracture superior endplate of 624THL with progression since 11/20/2018. Superior endplate fracture of L4 is new since the prior CT. Multilevel spondylosis. IMPRESSION: 1. Small loculated pleural effusion. Right middle lobe masslike density measuring 32 x 16 mm. This could represent infection, atelectasis,  tumor. Recommend dedicated CT chest with contrast for further evaluation. In addition, there is bibasilar atelectasis. 2. Calcified gallstones without biliary dilatation 3. Advanced atherosclerotic arterial calcification 4. Peritoneal dialysis catheter with small amount of free fluid.  5. Compression fracture T12 and L4 with progression since prior CT of 11/20/2018 Electronically Signed   By: Franchot Gallo M.D.   On: 02/19/2019 07:56   Dg Chest Port 1 View  Result Date: 02/17/2019 CLINICAL DATA:  68 year old female with hypoxia. EXAM: PORTABLE CHEST 1 VIEW COMPARISON:  Chest radiograph dated 02/17/2019 FINDINGS: Moderate right and small left pleural effusion similar or minimally increased since the earlier radiograph. Associated bibasilar densities may represent atelectasis or infiltrate. There is no pneumothorax. There is cardiomegaly with vascular congestion. Median sternotomy wires and CABG vascular clips. No acute osseous pathology. IMPRESSION: 1. Moderate right and small left pleural effusions similar or minimally increased since the earlier radiograph. Associated bibasilar densities may represent atelectasis or infiltrate. 2. Cardiomegaly with vascular congestion. Electronically Signed   By: Anner Crete M.D.   On: 02/17/2019 17:33   Dg Chest Port 1 View  Result Date: 02/17/2019 CLINICAL DATA:  Shortness of breath. EXAM: PORTABLE CHEST 1 VIEW COMPARISON:  February 15, 2019 FINDINGS: Stable postsurgical changes from CABG. Enlarged cardiac silhouette. Moderate right pleural effusion. Bilateral lower lung fields streaky opacities which likely represent atelectasis. Airspace consolidation the right lung base cannot be excluded. Osseous structures are without acute abnormality. Soft tissues are grossly normal. IMPRESSION: 1. Moderate right pleural effusion. 2. Left lower lung field streaky opacities likely represent atelectasis. 3. Moderate right pleural effusion. Atelectasis versus airspace consolidation  in the right lung base. Electronically Signed   By: Fidela Salisbury M.D.   On: 02/17/2019 10:32   Dg Chest Port 1 View  Result Date: 02/17/2019 CLINICAL DATA:  Initial evaluation for acute cough. EXAM: PORTABLE CHEST 1 VIEW COMPARISON:  Prior radiograph from 12/28/2018. FINDINGS: Median sternotomy wires underlying CABG markers and surgical clips, stable. Cardiomegaly unchanged. Mediastinal silhouette within normal limits. Right-sided hemodialysis catheter in place with tip overlying the cavoatrial junction. Lungs are hypoinflated. Small to moderate right pleural effusion with associated right basilar opacity, likely atelectasis, similar to previous. Streaky left basilar atelectatic changes also similar. Mild perihilar vascular congestion without pulmonary edema. No other new focal infiltrates. No pneumothorax. No acute osseous finding. IMPRESSION: 1. Small to moderate right pleural effusion, similar to previous. 2. Superimposed bibasilar atelectatic changes, also similar. No new focal infiltrates. 3. Cardiomegaly with mild perihilar vascular congestion without overt pulmonary edema. Electronically Signed   By: Jeannine Boga M.D.   On: 02/09/2019 22:22    Microbiology Recent Results (from the past 240 hour(s))  SARS CORONAVIRUS 2 (TAT 6-24 HRS) Nasopharyngeal Nasopharyngeal Swab     Status: None   Collection Time: 02/24/2019  8:20 PM   Specimen: Nasopharyngeal Swab  Result Value Ref Range Status   SARS Coronavirus 2 NEGATIVE NEGATIVE Final    Comment: (NOTE) SARS-CoV-2 target nucleic acids are NOT DETECTED. The SARS-CoV-2 RNA is generally detectable in upper and lower respiratory specimens during the acute phase of infection. Negative results do not preclude SARS-CoV-2 infection, do not rule out co-infections with other pathogens, and should not be used as the sole basis for treatment or other patient management decisions. Negative results must be combined with clinical  observations, patient history, and epidemiological information. The expected result is Negative. Fact Sheet for Patients: SugarRoll.be Fact Sheet for Healthcare Providers: https://www.woods-mathews.com/ This test is not yet approved or cleared by the [place] FDA and  has been authorized for detection and/or diagnosis of SARS-CoV-2 by FDA under an Emergency Use Authorization (EUA). This EUA will remain  in effect (meaning this test can be used)  for the duration of the COVID-19 declaration under Section 56 4(b)(1) of the Act, 21 U.S.C. section 360bbb-3(b)(1), unless the authorization is terminated or revoked sooner. Performed at [facility], [place] [address], [place], [place] [address]   Gastrointestinal Panel by PCR , Stool     Status: None   Collection Time: 02/11/2019  7:53 AM   Specimen: Stool  Result Value Ref Range Status   Campylobacter species NOT DETECTED NOT DETECTED Final   Plesimonas shigelloides NOT DETECTED NOT DETECTED Final   Salmonella species NOT DETECTED NOT DETECTED Final   Yersinia enterocolitica NOT DETECTED NOT DETECTED Final   Vibrio species NOT DETECTED NOT DETECTED Final   Vibrio cholerae NOT DETECTED NOT DETECTED Final   Enteroaggregative E coli (EAEC) NOT DETECTED NOT DETECTED Final   Enteropathogenic E coli (EPEC) NOT DETECTED NOT DETECTED Final   Enterotoxigenic E coli (ETEC) NOT DETECTED NOT DETECTED Final   Shiga like toxin producing E coli (STEC) NOT DETECTED NOT DETECTED Final   Shigella/Enteroinvasive E coli (EIEC) NOT DETECTED NOT DETECTED Final   Cryptosporidium NOT DETECTED NOT DETECTED Final   Cyclospora cayetanensis NOT DETECTED NOT DETECTED Final   Entamoeba histolytica NOT DETECTED NOT DETECTED Final   Giardia lamblia NOT DETECTED NOT DETECTED Final   Adenovirus F40/41 NOT DETECTED NOT DETECTED Final   Astrovirus NOT DETECTED NOT DETECTED Final   Norovirus GI/GII NOT DETECTED NOT DETECTED  Final   Rotavirus A NOT DETECTED NOT DETECTED Final   Sapovirus (I, II, IV, and V) NOT DETECTED NOT DETECTED Final    Comment: Performed at [facility], [address] [place], [place] [address]  C Difficile Quick Screen w PCR reflex     Status: Abnormal   Collection Time: 02/12/2019  7:53 AM   Specimen: STOOL  Result Value Ref Range Status   C Diff antigen POSITIVE (A) NEGATIVE Final   C Diff toxin NEGATIVE NEGATIVE Final   C Diff interpretation Results are indeterminate. See PCR results.  Final    Comment: Performed at [facility], [address] [place], [place] [address]  C. Diff by PCR, Reflexed     Status: Abnormal   Collection Time: 03/01/2019  7:53 AM  Result Value Ref Range Status   Toxigenic C. Difficile by PCR POSITIVE (A) NEGATIVE Final    Comment: Positive for toxigenic C. difficile with little to no toxin production. Only treat if clinical presentation suggests symptomatic illness. Performed at [facility], [address] [place], [place] [address]   MRSA PCR Screening     Status: None   Collection Time: 02/25/2019  6:12 PM   Specimen: Nasopharyngeal  Result Value Ref Range Status   MRSA by PCR NEGATIVE NEGATIVE Final    Comment:        The GeneXpert MRSA Assay (FDA approved for NASAL specimens only), is one component of a comprehensive MRSA colonization surveillance program. It is not intended to diagnose MRSA infection nor to guide or monitor treatment for MRSA infections. Performed at [facility], [address] [place],  [address]   CULTURE, BLOOD (ROUTINE X 2) w Reflex to ID Panel     Status: None   Collection Time: 02/15/2019 11:26 PM   Specimen: BLOOD  Result Value Ref Range Status   Specimen Description BLOOD BLOOD RIGHT FOREARM  Final   Special Requests   Final    BOTTLES DRAWN AEROBIC ONLY Blood Culture adequate volume   Culture   Final  NO GROWTH 5 DAYS Performed at [facility], [address] [place], [place] [address]    Report Status 03-16-2019 FINAL  Final  CULTURE, BLOOD (ROUTINE X 2) w Reflex to ID Panel     Status: None   Collection Time: 02/24/2019 11:27 PM   Specimen: BLOOD  Result Value Ref Range Status   Specimen Description BLOOD RAC  Final   Special Requests   Final    BOTTLES DRAWN AEROBIC ONLY Blood Culture adequate volume   Culture   Final    NO GROWTH 5 DAYS Performed at [facility], [address] [place], [place] [address]    Report Status 03-16-19 FINAL  Final    Lab Basic Metabolic Panel: Recent Labs  Lab 02/25/2019 1459  03/02/2019 2324 02/17/19 0518 02/18/19 1250 02/19/19 0215 02/20/19 0424 02/20/19 2201  NA 132*   < > 132* 134* 133* 133* 133* 133*  K 2.7*   < > 3.7 3.8 4.2 3.6 4.7 4.3  CL 95*   < > 98 103 100 98 100 100  CO2 20*   < > 19* 17* 16* 19* 18* 17*  GLUCOSE 89   < > 209* 176* 222* 179* 187* 206*  BUN 37*   < > 44* 44* 51* 54* 62* 61*  CREATININE 6.78*   < > 7.31* 6.87* 7.33* 7.23* 6.96* 6.64*  CALCIUM 6.6*   < > 6.6* 6.0* 7.1* 7.2* 7.1* 6.7*  MG 1.9  --   --   --   --  1.9  --  1.9  PHOS  --   --  7.8* 7.5*  --  8.2*  --  8.4*   < > = values in this interval not displayed.   Liver Function Tests: Recent Labs  Lab 03/08/2019 1459 03/07/2019 0143 02/19/2019 2324 02/20/19 2201  AST 27 25  --  121*  ALT 22 20  --  42  ALKPHOS 342* 338*  --  271*  BILITOT 0.9 0.9  --  1.1  PROT 5.8* 5.2*  --  4.9*  ALBUMIN 2.3* 2.0* 2.3* 1.8*   No results for input(s): LIPASE, AMYLASE in the last 168 hours. No results for input(s): AMMONIA in the last 168 hours. CBC: Recent Labs  Lab 02/17/2019 1459 02/12/2019 0143 02/22/2019 2324 02/19/19 0215 02/20/19 0424 02/20/19 2201  WBC 11.3* 9.5 11.7* 14.7* 14.4* 15.4*  NEUTROABS 9.4*  --  9.7* 13.3* 13.4*  --   HGB 12.0 11.3* 12.8 11.2* 11.0* 11.3*  HCT 35.6* 33.0* 39.3 33.2* 32.3* 33.3*  MCV 92.0 91.9 95.2 91.5 90.0 90.5  PLT 233 206 271 210 198 205   Cardiac Enzymes: No  results for input(s): CKTOTAL, CKMB, CKMBINDEX, TROPONINI in the last 168 hours. Sepsis Labs: Recent Labs  Lab 02/13/2019 1459 02/24/2019 1738  02/16/19 2324 02/19/19 0215 02/20/19 0424 02/20/19 2201  PROCALCITON  --   --   --  0.18  --   --   --   WBC 11.3*  --    < > 11.7* 14.7* 14.4* 15.4*  LATICACIDVEN 3.7* 2.6*  --   --   --   --  3.3*   < > = values in this interval not displayed.    Procedures/Operations  None  Magddalene S Tukov-Yual 03-16-2019, 10:33 AM

## 2019-03-11 NOTE — Progress Notes (Addendum)
Name: Theresa Rogers MRN: [id number] DOB: 08/01/1950    ADMISSION DATE:  02/14/2019  BRIEF PATIENT DESCRIPTION:  68 year old female with ESRD on peritoneal dialysis (hx of noncompliance with dialysis)admitted 9/7 with generalized weakness, and acute on chronic diarrhea secondary to C. Difficile.  Became hypotensive on 9/8 (likely secondary to hypovolemic shock) requiring transfer to ICU for possible pressors.  Despite treatment, patient continues to decline.  This evening, patient became completely unresponsive, hypotensive and hypoxic.  Patient's daughter/POA was notified and after discussing patient's prognosis in light of her current comorbidities, the daughter indicated that she did not want patient to suffer any further.  Patient was made a DNR/DNI with no escalation of care with the plan of transitioning to comfort care should she continue to decline.  SIGNIFICANT EVENTS/STUDIES   09/7-Admission to medsurg unit with generalized weakness and acute on chronic diarrhea secondary to C. Diff  09/8-Transferred to ICU due to hypotension due to hypovolemic and component of cardiogenic shock  09/8-Removal of clotted PermCath per Vascular Surgery 09/8-ID consulted recommended treating C. Diff colitis due to chronic diarrhea with po vancomycin for 10-14 days  09/9-Unable to obtain accurate cuff blood pressure readings right femoral a-line placed 09/9-Echo revealed severely reduced systolic function with EF 20-25%, left ventricular diffuse hypokinesis, and right ventricle has moderately reduced systolic function  [id number] with continued nausea/vomiting CT Abd Pelvis revealed small loculated pleural effusion. Right middle lobe mass-like density measuring 32 x 16 mm. This could represent infection, atelectasis, tumor. Recommend dedicated CT chest with contrast for further evaluation. In addition, there is bibasilar atelectasis. Calcified gallstones without biliary dilatation Advanced atherosclerotic  arterial calcification Peritoneal dialysis catheter with small amount of free fluid. Compression fracture T12 and L4 with progression since prior CT of 11/20/2018 11/21/2018: Developed acute hypoxia, unresponsive and severe shock. After discussion with daughter Theresa Rogers, patient was made DNR/DNI and transitioned to comfort care. EOL care orders placed  CULTURES: SARS-CoV-2 PCR 9/7>>negative MRSA PCR 9/8>>negative C. difficile by PCR 9/8>>antigen positive, toxin negative; + Toxigenic C-diff GI panel PCR 9/8>>negative Blood x2 9/8>>negative x2  ANTIBIOTICS: PO Vancomycin 9/8 >>  SUBJECTIVE:  Poorly oriented.  Intermittently somnolent.  No overt respiratory distress.  VITAL SIGNS: Temp:  [97.5 F (36.4 C)] 97.5 F (36.4 C) (09/12 0821) Pulse Rate:  [37-69] 52 (09/13 0700) Resp:  [9-19] 12 (09/13 0700) BP: (70)/(33) 70/33 (09/13 0600) SpO2:  [92 %-99 %] 96 % (09/12 1700) Arterial Line BP: (61-127)/(27-55) 61/27 (09/13 0600)  PHYSICAL EXAMINATION: General: Chronically ill-appearing, frail, NAD Neuro: Cranial nerves intact, moves all extremities HEENT: NCAT, sclerae white Cardiovascular: Regular, no M Lungs: Clear anteriorly Abdomen: Soft, + BS, NT Extremities: Cool, no edema Skin: Generalized erythema  Recent Labs  Lab 02/19/19 0215 02/20/19 0424 02/20/19 2201  NA 133* 133* 133*  K 3.6 4.7 4.3  CL 98 100 100  CO2 19* 18* 17*  BUN 54* 62* 61*  CREATININE 7.23* 6.96* 6.64*  GLUCOSE 179* 187* 206*   Recent Labs  Lab 02/19/19 0215 02/20/19 0424 02/20/19 2201  HGB 11.2* 11.0* 11.3*  HCT 33.2* 32.3* 33.3*  WBC 14.7* 14.4* 15.4*  PLT 210 198 205   CXR: No new film  ASSESSMENT: Acute hypoxemic respiratory failure R>L bilateral effusions Dilated CM with LVEF 20-25% Hypotension requiring dopamine H/O PAF ESRD on chronic PD C diff colitis Chronic anemia without overt blood loss DM 2 with labile CBGs Generalized frailty with guarded long term prognosis   PLAN/REC: Continue supplemental oxygen via nasal cannula as  needed for comfort Wean dopamine to off as tolerated for MAP goal >60 mmHg Stop peritoneal dialysis per daughter Cont enteral vancomycin DC arterial catheter Cont [facility] Palliative Care Service to see - she is a very poor candidate for intubation or ACLS  Addendum: At about 6:30 AM, patient's blood pressure dropped into the 60s and her respirations became more apneic.  Patient's daughter notified and recommended transitioning patient to full comfort care.  End-of-life orders placed and Dr. Jamal Collin notified.  Magdalene S. Tukov-Yual ANP-BC Pulmonary and [place] Pager [phone] or [phone]  NB: This document was prepared using Dragon voice recognition software and may include unintentional dictation errors.    Mar 15, 2019 7:40 AM

## 2019-03-11 NOTE — Progress Notes (Signed)
Pt turned and cleaned w/ sheets changed at 0730. Pt pronounced dead at 75 Daughter Miguel Dibble notified of TOD by writing RN, she has not decided on a funeral home but verbalized understanding that pt will be moved to the morgue.  ACs ASCON # provided to [facility] so she can notify us when she has made funeral arrangements. CDS notified referral # [id number]

## 2019-03-11 NOTE — Telephone Encounter (Signed)
Death certificate has been completed and placed up front for pickup.  [place] funeral home as been made aware.

## 2019-03-11 NOTE — Progress Notes (Signed)
Patient transported to the morgue.  Writing RN has left message for her daughter Theresa Rogers on Theresa Rogers's cell phone; patient's purse, two pairs eyeglasses, cell phone, charger, and clothing is bagged and here on ICU.  We can bring to her at the MM entrance if she does not wish to come up to ICU

## 2019-03-11 NOTE — Progress Notes (Signed)
Pd completed 

## 2019-03-11 DEATH — deceased
